# Patient Record
Sex: Female | Born: 1935 | Race: White | Hispanic: No | Smoking: Never smoker
Health system: Southern US, Community
[De-identification: ages and names within clinical notes are randomized; demographics above are authoritative.]

## PROBLEM LIST (undated history)

## (undated) DIAGNOSIS — Z950 Presence of cardiac pacemaker: Secondary | ICD-10-CM

## (undated) DIAGNOSIS — M461 Sacroiliitis, not elsewhere classified: Secondary | ICD-10-CM

## (undated) DIAGNOSIS — M8580 Other specified disorders of bone density and structure, unspecified site: Secondary | ICD-10-CM

## (undated) DIAGNOSIS — E7800 Pure hypercholesterolemia, unspecified: Secondary | ICD-10-CM

## (undated) DIAGNOSIS — R0609 Other forms of dyspnea: Secondary | ICD-10-CM

## (undated) DIAGNOSIS — R51 Headache: Secondary | ICD-10-CM

## (undated) DIAGNOSIS — M1990 Unspecified osteoarthritis, unspecified site: Secondary | ICD-10-CM

## (undated) DIAGNOSIS — I10 Essential (primary) hypertension: Secondary | ICD-10-CM

## (undated) DIAGNOSIS — R5383 Other fatigue: Secondary | ICD-10-CM

## (undated) DIAGNOSIS — J45909 Unspecified asthma, uncomplicated: Secondary | ICD-10-CM

## (undated) DIAGNOSIS — F419 Anxiety disorder, unspecified: Secondary | ICD-10-CM

## (undated) DIAGNOSIS — R0600 Dyspnea, unspecified: Secondary | ICD-10-CM

## (undated) DIAGNOSIS — F4321 Adjustment disorder with depressed mood: Secondary | ICD-10-CM

## (undated) DIAGNOSIS — N183 Chronic kidney disease, stage 3 (moderate): Secondary | ICD-10-CM

## (undated) DIAGNOSIS — R29898 Other symptoms and signs involving the musculoskeletal system: Secondary | ICD-10-CM

## (undated) DIAGNOSIS — M719 Bursopathy, unspecified: Secondary | ICD-10-CM

## (undated) DIAGNOSIS — E669 Obesity, unspecified: Secondary | ICD-10-CM

## (undated) DIAGNOSIS — F32A Depression, unspecified: Secondary | ICD-10-CM

## (undated) DIAGNOSIS — R079 Chest pain, unspecified: Secondary | ICD-10-CM

## (undated) DIAGNOSIS — I509 Heart failure, unspecified: Secondary | ICD-10-CM

## (undated) DIAGNOSIS — G609 Hereditary and idiopathic neuropathy, unspecified: Principal | ICD-10-CM

## (undated) DIAGNOSIS — E039 Hypothyroidism, unspecified: Secondary | ICD-10-CM

## (undated) DIAGNOSIS — M6530 Trigger finger, unspecified finger: Secondary | ICD-10-CM

## (undated) DIAGNOSIS — D638 Anemia in other chronic diseases classified elsewhere: Secondary | ICD-10-CM

## (undated) DIAGNOSIS — F329 Major depressive disorder, single episode, unspecified: Secondary | ICD-10-CM

## (undated) DIAGNOSIS — C801 Malignant (primary) neoplasm, unspecified: Secondary | ICD-10-CM

## (undated) DIAGNOSIS — G629 Polyneuropathy, unspecified: Secondary | ICD-10-CM

## (undated) DIAGNOSIS — J189 Pneumonia, unspecified organism: Secondary | ICD-10-CM

## (undated) DIAGNOSIS — I447 Left bundle-branch block, unspecified: Secondary | ICD-10-CM

## (undated) HISTORY — PX: ACHILLES TENDON SURGERY: SHX542

## (undated) HISTORY — DX: Trigger finger, unspecified finger: M65.30

## (undated) HISTORY — DX: Hereditary and idiopathic neuropathy, unspecified: G60.9

## (undated) HISTORY — DX: Hypothyroidism, unspecified: E03.9

## (undated) HISTORY — DX: Other specified disorders of bone density and structure, unspecified site: M85.80

## (undated) HISTORY — DX: Polyneuropathy, unspecified: G62.9

## (undated) HISTORY — DX: Pure hypercholesterolemia, unspecified: E78.00

## (undated) HISTORY — DX: Obesity, unspecified: E66.9

## (undated) HISTORY — DX: Other symptoms and signs involving the musculoskeletal system: R29.898

## (undated) HISTORY — PX: TONSILLECTOMY: SUR1361

## (undated) HISTORY — DX: Sacroiliitis, not elsewhere classified: M46.1

## (undated) HISTORY — DX: Other fatigue: R53.83

## (undated) HISTORY — DX: Other forms of dyspnea: R06.09

## (undated) HISTORY — DX: Dyspnea, unspecified: R06.00

## (undated) HISTORY — DX: Chest pain, unspecified: R07.9

## (undated) HISTORY — PX: POLYPECTOMY: SHX149

## (undated) HISTORY — DX: Essential (primary) hypertension: I10

## (undated) HISTORY — PX: BREAST LUMPECTOMY: SHX2

## (undated) HISTORY — PX: LUMBAR LAMINECTOMY: SHX95

## (undated) HISTORY — PX: VESICOVAGINAL FISTULA CLOSURE W/ TAH: SUR271

## (undated) HISTORY — PX: LEG SURGERY: SHX1003

---

## 1998-09-30 ENCOUNTER — Ambulatory Visit (HOSPITAL_COMMUNITY): Admission: RE | Admit: 1998-09-30 | Discharge: 1998-09-30 | Payer: Self-pay | Admitting: *Deleted

## 1998-11-12 ENCOUNTER — Encounter (INDEPENDENT_AMBULATORY_CARE_PROVIDER_SITE_OTHER): Payer: Self-pay

## 1998-11-12 ENCOUNTER — Encounter: Payer: Self-pay | Admitting: General Surgery

## 1998-11-12 ENCOUNTER — Ambulatory Visit (HOSPITAL_COMMUNITY): Admission: RE | Admit: 1998-11-12 | Discharge: 1998-11-12 | Payer: Self-pay

## 1998-11-24 ENCOUNTER — Encounter: Admission: RE | Admit: 1998-11-24 | Discharge: 1999-02-22 | Payer: Self-pay | Admitting: Radiation Oncology

## 1999-04-06 ENCOUNTER — Encounter: Payer: Self-pay | Admitting: Obstetrics and Gynecology

## 1999-04-06 ENCOUNTER — Encounter: Admission: RE | Admit: 1999-04-06 | Discharge: 1999-04-06 | Payer: Self-pay | Admitting: Family Medicine

## 1999-09-10 ENCOUNTER — Ambulatory Visit (HOSPITAL_COMMUNITY): Admission: RE | Admit: 1999-09-10 | Discharge: 1999-09-10 | Payer: Self-pay | Admitting: Gastroenterology

## 1999-09-28 ENCOUNTER — Encounter: Payer: Self-pay | Admitting: Oncology

## 1999-09-28 ENCOUNTER — Encounter: Admission: RE | Admit: 1999-09-28 | Discharge: 1999-09-28 | Payer: Self-pay | Admitting: Oncology

## 1999-12-17 ENCOUNTER — Encounter (INDEPENDENT_AMBULATORY_CARE_PROVIDER_SITE_OTHER): Payer: Self-pay

## 1999-12-17 ENCOUNTER — Other Ambulatory Visit: Admission: RE | Admit: 1999-12-17 | Discharge: 1999-12-17 | Payer: Self-pay | Admitting: Obstetrics and Gynecology

## 2001-02-23 ENCOUNTER — Encounter: Payer: Self-pay | Admitting: General Surgery

## 2001-02-23 ENCOUNTER — Encounter: Admission: RE | Admit: 2001-02-23 | Discharge: 2001-02-23 | Payer: Self-pay | Admitting: General Surgery

## 2001-04-25 ENCOUNTER — Other Ambulatory Visit: Admission: RE | Admit: 2001-04-25 | Discharge: 2001-04-25 | Payer: Self-pay | Admitting: Obstetrics and Gynecology

## 2001-05-19 ENCOUNTER — Encounter: Payer: Self-pay | Admitting: Orthopaedic Surgery

## 2001-05-19 ENCOUNTER — Inpatient Hospital Stay (HOSPITAL_COMMUNITY): Admission: EM | Admit: 2001-05-19 | Discharge: 2001-05-26 | Payer: Self-pay | Admitting: Orthopaedic Surgery

## 2001-05-20 ENCOUNTER — Encounter: Payer: Self-pay | Admitting: Orthopaedic Surgery

## 2002-02-26 ENCOUNTER — Encounter: Admission: RE | Admit: 2002-02-26 | Discharge: 2002-02-26 | Payer: Self-pay | Admitting: General Surgery

## 2002-02-26 ENCOUNTER — Encounter: Payer: Self-pay | Admitting: General Surgery

## 2002-05-17 ENCOUNTER — Other Ambulatory Visit: Admission: RE | Admit: 2002-05-17 | Discharge: 2002-05-17 | Payer: Self-pay | Admitting: Obstetrics and Gynecology

## 2002-12-19 ENCOUNTER — Ambulatory Visit (HOSPITAL_COMMUNITY): Admission: RE | Admit: 2002-12-19 | Discharge: 2002-12-19 | Payer: Self-pay | Admitting: Obstetrics and Gynecology

## 2002-12-19 ENCOUNTER — Encounter (INDEPENDENT_AMBULATORY_CARE_PROVIDER_SITE_OTHER): Payer: Self-pay

## 2003-02-28 ENCOUNTER — Encounter: Admission: RE | Admit: 2003-02-28 | Discharge: 2003-02-28 | Payer: Self-pay | Admitting: General Surgery

## 2003-05-30 ENCOUNTER — Other Ambulatory Visit: Admission: RE | Admit: 2003-05-30 | Discharge: 2003-05-30 | Payer: Self-pay | Admitting: Obstetrics and Gynecology

## 2003-09-18 ENCOUNTER — Encounter: Admission: RE | Admit: 2003-09-18 | Discharge: 2003-09-18 | Payer: Self-pay | Admitting: Orthopaedic Surgery

## 2003-09-19 ENCOUNTER — Ambulatory Visit (HOSPITAL_COMMUNITY): Admission: RE | Admit: 2003-09-19 | Discharge: 2003-09-19 | Payer: Self-pay | Admitting: Family Medicine

## 2003-10-14 ENCOUNTER — Ambulatory Visit (HOSPITAL_COMMUNITY): Admission: RE | Admit: 2003-10-14 | Discharge: 2003-10-14 | Payer: Self-pay | Admitting: Obstetrics and Gynecology

## 2003-12-06 ENCOUNTER — Encounter: Admission: RE | Admit: 2003-12-06 | Discharge: 2003-12-06 | Payer: Self-pay | Admitting: Obstetrics and Gynecology

## 2003-12-22 ENCOUNTER — Ambulatory Visit: Payer: Self-pay | Admitting: Oncology

## 2004-03-20 ENCOUNTER — Encounter: Admission: RE | Admit: 2004-03-20 | Discharge: 2004-03-20 | Payer: Self-pay | Admitting: Family Medicine

## 2004-07-08 ENCOUNTER — Other Ambulatory Visit: Admission: RE | Admit: 2004-07-08 | Discharge: 2004-07-08 | Payer: Self-pay | Admitting: Obstetrics and Gynecology

## 2004-10-27 ENCOUNTER — Ambulatory Visit (HOSPITAL_COMMUNITY): Admission: RE | Admit: 2004-10-27 | Discharge: 2004-10-27 | Payer: Self-pay | Admitting: Gastroenterology

## 2005-04-06 ENCOUNTER — Encounter: Admission: RE | Admit: 2005-04-06 | Discharge: 2005-04-06 | Payer: Self-pay | Admitting: Family Medicine

## 2005-07-12 ENCOUNTER — Other Ambulatory Visit: Admission: RE | Admit: 2005-07-12 | Discharge: 2005-07-12 | Payer: Self-pay | Admitting: Obstetrics and Gynecology

## 2005-12-20 ENCOUNTER — Encounter
Admission: RE | Admit: 2005-12-20 | Discharge: 2005-12-20 | Payer: Self-pay | Admitting: Physical Medicine and Rehabilitation

## 2006-04-11 ENCOUNTER — Encounter: Admission: RE | Admit: 2006-04-11 | Discharge: 2006-04-11 | Payer: Self-pay | Admitting: *Deleted

## 2006-10-05 ENCOUNTER — Other Ambulatory Visit: Admission: RE | Admit: 2006-10-05 | Discharge: 2006-10-05 | Payer: Self-pay | Admitting: Obstetrics and Gynecology

## 2007-05-09 ENCOUNTER — Encounter: Admission: RE | Admit: 2007-05-09 | Discharge: 2007-05-09 | Payer: Self-pay | Admitting: Obstetrics and Gynecology

## 2007-11-01 ENCOUNTER — Other Ambulatory Visit: Admission: RE | Admit: 2007-11-01 | Discharge: 2007-11-01 | Payer: Self-pay | Admitting: Obstetrics and Gynecology

## 2008-05-10 ENCOUNTER — Encounter: Admission: RE | Admit: 2008-05-10 | Discharge: 2008-05-10 | Payer: Self-pay | Admitting: Family Medicine

## 2008-11-04 ENCOUNTER — Other Ambulatory Visit: Admission: RE | Admit: 2008-11-04 | Discharge: 2008-11-04 | Payer: Self-pay | Admitting: Obstetrics and Gynecology

## 2009-05-16 ENCOUNTER — Encounter: Admission: RE | Admit: 2009-05-16 | Discharge: 2009-05-16 | Payer: Self-pay | Admitting: Obstetrics and Gynecology

## 2010-04-30 ENCOUNTER — Other Ambulatory Visit: Payer: Self-pay | Admitting: Family Medicine

## 2010-04-30 DIAGNOSIS — Z1231 Encounter for screening mammogram for malignant neoplasm of breast: Secondary | ICD-10-CM

## 2010-05-26 ENCOUNTER — Ambulatory Visit
Admission: RE | Admit: 2010-05-26 | Discharge: 2010-05-26 | Disposition: A | Discharge status: Home / Self Care | Payer: Medicare Other | Source: Ambulatory Visit | Attending: Family Medicine | Admitting: Family Medicine

## 2010-05-26 DIAGNOSIS — Z1231 Encounter for screening mammogram for malignant neoplasm of breast: Secondary | ICD-10-CM

## 2010-06-05 NOTE — H&P (Signed)
NAME:  Alexandra [name], Alexandra [name]                        ACCOUNT NO.:  [id number]   MEDICAL RECORD NO.:  [id number]                   PATIENT TYPE:  AMB   LOCATION:  SDC                                  FACILITY:  [facility]   PHYSICIAN:  Sander Radon, M.D.                 DATE OF BIRTH:  [birth year]   DATE OF ADMISSION:  12/19/2002  DATE OF DISCHARGE:                                HISTORY & PHYSICAL   HISTORY AND PHYSICAL:  The patient is a 77 year old Caucasian female, para  5, admitted for D&C, hysteroscopy and polypectomy. The patient called our  office on October 03, 2002 complaining of vaginal bleeding. She  subsequently underwent a pelvic ultrasound which showed a thickened  endometrium measuring 12 mm with inhomogeneous echoes and cystic areas and  echogenic foci. She subsequently underwent a sonohysterogram. At  sonohysterogram, she was found to have at least three polyp defects. The  patient was advised to undergo D&C, hysteroscopy and polypectomy. The risks  of surgery including anesthetic complications, hemorrhage, infection, damage  to adjacent structures including bladder, bowel, blood vessels, and ureter  were discussed with the patient. She was made aware of the risk of uterine  perforation which could result in overwhelming life threatening hemorrhage  requiring emergent hysterectomy or uterine perforation which could result in  bowel damage requiring emergent colostomy which could result in overwhelming  life threatening peritonitis. She expressed an understanding of and  acceptance of these risks. She is also admitted for a D&C, hysteroscopy and  polypectomy.  </PAST   OBSTETRICAL/GYNECOLOGICAL HISTORY:  The patient has been menopausal for  several years.   PAST MEDICAL HISTORY:  1. History of left breast cancer diagnosed in November 2000.  2. Arthritis.  3. Depression.  4. Previous __________ Pap.  5. History of osteopenia.   ALLERGIES:  No known drug allergies.   CURRENT MEDICATIONS:  1. Fosamax 70 mg weekly.  2. Calcium.  3. Multivitamin.  4. Lexapro.  5. Tamoxifen.  6. Vioxx.   PAST SURGICAL HISTORY:  1. Left lumpectomy and axillary dissection in 2000.  2. Achilles tendon surgery, November 2001. Section of Achilles tendon with     subsequent surgery August 2002 and July 2002.  3. The patient also had a LEEP for CIN1, February 1998 and also underwent a     D&C, hysteroscopy for postmenopausal bleeding in 1999.   FAMILY HISTORY:  There is no family history of colon, ovarian, or prostate  cancer. The patient's maternal aunt was diagnosed with breast cancer. The  patient's mother died at 68. Her father died at 49 of pancreatic cancer. She  has a brother age 7 alive and well, two sisters ages 12 and 46 alive and  well. Five children ages ranged 38 to 77 alive and well.   SOCIAL HISTORY:  Occupation, the patient is retired. Tobacco none. Alcohol  none.   REVIEW OF SYMPTOMS:  Noncontributory except  as noted above. Denies  headaches, visual changes, chest pain, shortness of breath, abdominal pain,  change in bowel habits, unintentional weight loss. Denies dysuria, urgency,  frequency, vaginal pruritus or discharge, pain or bleeding with intercourse.   PHYSICAL EXAMINATION:  GENERAL:  Well-developed, Caucasian female.  VITAL SIGNS:  Blood pressure 160/80, heart rate 92, weight 231.5.  HEENT:  Normal.  NECK:  Supple without thyromegaly, adenopathy or nodules.  CHEST:  Clear to auscultation.  BREASTS:  In the upper outer quadrant of the left breast is a small well  healed lumpectomy scar. There are no masses noted, No retraction or nipple  discharge.  CARDIAC:  Regular rate and rhythm without extra sounds or murmurs.  ABDOMEN:  Soft, nontender, no hepatosplenomegaly or masses.  EXTREMITIES:  No cyanosis, clubbing or edema.  NEUROLOGICAL:  Oriented x3.  PELVIC:  Grossly normal pelvic examination. Normal external female  genitalia. No vulvar,  vaginal or cervical lesions. The patient's most recent  Pap smear done May 17, 2002 showed benign reactive changes. Bimanual  examination reveals the uterus to be anteverted, six weeks, mobile,  nontender without any adnexal mass palpated.  RECTAL:  Excellent sphincter tone, confirms pelvic exam, no masses palpated.   LABORATORY DATA:  Preoperative laboratories revealed sodium of 141,  potassium 4.9, chloride 105, CO2 30, BUN of 17 and creatinine 1.0. Liver  function tests were normal as well. With respect to her CBC, hemoglobin is  normal at 12.8 with a platelet count of 234,000 and a white count of 6800.  Urinalysis is negative.   ASSESSMENT/PLAN:  The patient is a 77 year old Caucasian female with a  history of postmenopausal bleeding and at least three polyps noted on  sonohysterogram admitted for dilatation and curettage, hysteroscopy and  polypectomy. She has increased risk to develop endometrial hyperplasia or  endometrial cancer due to her history of breast cancer and also due to her  body habitus as she is 5 feet 4-1/2 inches and weighs 231 pounds. We did  receive a note from Dr. Granville Lewis clearing her for surgery. The patient's  questions have been answered. She expressed an understanding of and  acceptance of the risks of surgery and desires to proceed.                                               Sander Radon, M.D.    DC/MEDQ  D:  12/18/2002  T:  12/18/2002  Job:  [id number]

## 2010-06-05 NOTE — Discharge Summary (Signed)
[facility]  Patient:    Alexandra [name], Alexandra [name] Visit Number: [id number] MRN: [id number]          Service Type: SUR Location: 4W 0482 01 Attending Physician:  Starling Manns Dictated by:   Alexzandrew L. Perkins, P.A.-C. Admit Date:  05/19/2001 Discharge Date: 05/26/2001                             Discharge Summary  ADMITTING DIAGNOSES: 1. Spinal stenosis at L3-L4 with disc herniation and free fragment. 2. History of breast carcinoma. 3. History of Achilles tendon rupture.  DISCHARGE DIAGNOSES: 1. Spinal stenosis with disc herniation at L3-L4, status post L2-L3, L3-L4,    L4-L5 lumbar laminectomy with decompression of dural sac and nerve roots    bilaterally at L2, L3, L4 and L5. 2. Status post excision of extruded L3-L4 disc fragment. 3. Postoperative hemorrhagic anemia. 4. Status post transfusion without sequelae. 5. History of breast carcinoma. 6. History of Achilles tendon rupture.  PROCEDURE:  The patient was taken to the operating room on May 20, 2001, and underwent a lumbar laminectomy at L2-L3, L3-L4, L4-L5 with decompression of the common dural sac and the bilateral nerve roots at L2, L3, L4 and L5 with removal of extruded disc fragment at L3-L4.  Surgeon Dr. Rennis Harding, assistant [facility], P.A.-C under general anesthesia.  HISTORY OF PRESENT ILLNESS:  The patient is a 77 year old female who presented to the office with 36-hour severe lower extremity pain and weakness.  She was unable to ambulate with legs giving way and buckling of her knee.  She was sent over for an MRI on emergent situation and the patient was found to have profound right lower extremity quad weakness.  She started on medications and she was admitted for diagnostic workup and likely to undergo surgery.  LABORATORY DATA AND X-RAY FINDINGS:  CBC on admission with hemoglobin 11.6, hematocrit 33.2, white cell count 8.5, red cell count 3.54.  Postop H&H 9.3 and 27.0 and  continued to drop down to 8.6 and 25.0, given units of blood. Transfusion hemoglobin 10.8 and hematocrit 31.3.  PT/PTT 13.7 and 25 respectively with an INR of 1.0 on admission.  Chem panel on admission with an elevated BUN of 26 and decreased Alk phos at 27.  The remaining Chem panel was within normal limits.  Followup BMET with glucose dropping from 161 down to 136, BUN came back down to within normal limits at 19, calcium dropped 9.1 to 8.1.  Last BMET on May 23, 2001, with all electrolytes including glucose and calcium within normal limits.  Urinalysis on admission with large leukocyte esterase and many epithelial cells.  White cells 21-50 and a few bacteria. Blood group type A positive.  EKG dated May 19, 2001, with normal sinus rhythm and normal EKG.  Chest x-ray on May 19, 2001, with mild cardiac enlargement, negative for acute and inflammatory process.  Intraoperative spine films on May 20, 2001, shows localization of two probes at the level of L3 and L4 inferior end plates.  HOSPITAL COURSE:  The patient was admitted to [facility] and sent for an MRI.  The patient underwent diagnostic workup and found to have spinal stenosis and extruded disc at L3-L4.  She was scheduled for surgery and taken to the operating room on May 20, 2001, and underwent the above-stated procedure without complication.  The patient tolerated the procedure well and was lateral transferred back to the  floor.  Hemovac drain was placed at the time of surgery.  This was pulled without difficulty on postop day #2.  She was started on p.o. and IV analgesic for pain control following surgery.  Home medications were restarted.  She was actually placed on PCA Dilaudid. Physical therapy was consulted postop to assist with ambulation.  H&Hs were followed very closely.  She had a lower hemoglobin on postop day #1.  On postop day #2, her hemoglobin continued to drop down to a level of 8.6.  This was down from 11.6  on admission.  It was recommended that she receive blood. Hemoglobin was back up to 10.8.  She was weaned off her PCA analgesics over to p.o. analgesics.  Her diet was slowly increased when she started passing flatus.  She was slow to progress with physical therapy, therefore, rehabilitation consult was ordered.  The patient was seen in consultation by Dr. Jarvis Morgan and he felt she would be appropriate for inpatient rehabilitation and would transfer over in the next two to four days. Therefore, she continued with rehabilitation and therapy.  Dressing changes were initiated on postop day #2.  The incision was healing well.  She continued to improve throughout the hospital course.  There was no bed on rehabilitation, therefore she continued with physical therapy on the orthopedic floor.  During this time, her ambulatory status improved and she was up by May 26, 2001, ambulating 80-100 feet.  It was felt that she had improved well enough that she would not require inpatient stay.  Discharge planning assisted with home care needs.  The patient would be started out on home health PT and OT.  She continued to improve and by May 26, 2001, she was doing quite well.  Her bowels were moving and she was tolerating p.o. analgesics and she desired to go home.  DISPOSITION:  Discharged to home on May 26, 2001.  DISCHARGE MEDICATIONS: 1. Colace 100 mg #60 b.i.d. 2. Percocet #40 p.r.n. pain. 3. Robaxin 500 mg #40 p.r.n. spasm.  DIET:  As tolerated.  ACTIVITY:  She is placed into a lumbar corset for comfort.  Walker for stability.  She may be up as tolerated.  Back precautions.  Home health PT for continued gait training ambulation.  FOLLOWUP:  Follow up in two to three weeks with Dr. Patrice Paradise.  She is to call for an appointment at [phone].  CONDITION ON DISCHARGE:  Improved. Dictated by:   Alexzandrew L. Perkins, P.A.-C. Attending Physician:  Starling Manns. DD:  06/19/01 TD:  06/21/01 Job:  [id number]

## 2010-06-05 NOTE — Op Note (Signed)
[facility]  Patient:    Alexandra [name], Alexandra [name] Visit Number: [id number] MRN: [id number]          Service Type: SUR Location: 4W 0482 01 Attending Physician:  Starling Manns Dictated by:   Starling Manns, M.D. Proc. Date: 05/20/01 Admit Date:  05/19/2001                             Operative Report  DIAGNOSES: 1. Extruded L3-4 disk herniation with right lower extremity radiculopathy. 2. Spinal stenosis.  PROCEDURES: 1. L2-3, L3-4, L4-5 lumbar laminectomy with decompression of the common dural    sac and the L2, L3, L4, and L5 nerve roots bilaterally. 2. Removal of extruded L3-4 disk fragment.  SURGEON:  Starling Manns, M.D.  ASSISTANT:  Colleen P. Mahar, P.A.  ANESTHESIA:  General endotracheal.  ESTIMATED BLOOD LOSS:  750 cc.  COMPLICATIONS:  None.  INDICATIONS AND FINDINGS:  The patient is a 77 year old female, who presented to the office yesterday with a 36 hour history of severe right lower extremity pain, numbness, and weakness.  She was unable to ambulate.  She complained of severe quad weakness and buckling of her knee.  She was initially evaluated by Dr. Gladstone Lighter, who sent her for an emergent MRI scan because of the profound right lower extremity quad weakness.  She had been started on a prednisone dosepak by per primary care physician.  She is taking hydrocodone as well as muscle relaxers.  Her exam was consistent with a right lower extremity radiculopathy.  Quadriceps strength on the right was 2/5.  Hip flexors 3/5. There was an absent right knee jerk.  MRI scan showed multilevel degenerative disk disease with a large, extruded disk fragment at L3-4 on the right which had migrated distally almost to the L4-5 disk space.  There was also multifactorial spinal stenosis, most severe at L3-4 with severe bilateral subarticular lateral recess stenosis L3-4 and L4-5.  Because of her intractable pain and progressive quad weakness and inability to  ambulate, she was admitted to the hospital.  After weighing the risks, benefits and alternatives of surgical decompression, the patient elected to proceed with surgery.  At surgery, the facet joint and ligamentum flavum were found to be hypertrophied.  There was severe central and lateral recess stenosis bilaterally at L3-4 and L4-5.  The right L4 nerve root was found to be severe compressed by a large, extruded disk fragment which had migrated distally into the axilla of the root and almost to the L4-5 interspace.  The fragment was removed, and the nerve completely decompressed from its takeoff, all the way out the L4-5 foramen.  The common dural sac and L2, L3, L4, and L5 nerve roots were decompressed bilaterally by removing the inferior portion of the L2 lamina, the entire L3 and L4 lamina, and the superior portion of the L5 lamina.  The L3-4 disk was examined, and I could not identify an annular rent. The disk itself felt firm and partially calcified, and we elected not to perform an annulotomy.  The L4-5 disk space was also examined and, again, felt to be partially calcified, and no annular rent was identified.  There were large epidural bleeders encountered, and these were controlled with bipolar electrocautery and Gelfoam.  DESCRIPTION OF PROCEDURE:  The patient was properly identified in the holding area as Alexandra [name] and taken to the operating room.  She underwent general endotracheal anesthesia without difficulty.  She was given IV prophylactic antibiotics.  She was turned prone onto the Acumed four-poster positioning frame.  Her face and all bony prominences were padded.  The back was prepped, draped, and shaved in the usual sterile fashion.  An incision was made over the L2-3, L4 and L5 spinous processes.  This was carried down to deep fascia. The deep fascia was incised, and the paraspinous muscles were subperiosteally stripped out to the facet joints bilaterally.  Care  was taken to protect the facet joint capsules.  We identified the pars interarticularis at L3-4 and L5 We took an intraoperative x-ray, confirming that our probes were on the L3 and L4 spinous process.  We then performed a complete laminectomy using Leksell rongeurs and Kerrisons, removing the inferior portion of the L2 lamina, the entire L3 and L4 lamina, and the superior portion of the L5 lamina.  At this point, the lateral recess was decompressed bilaterally by performing medial one-third facetectomies at L3-4 and L4-5 bilaterally.  On the right side, we gently retracted the thecal sac and found that we were unable to mobilize the L4 nerve root.  We identified the large, extruded disk fragment which was in the axilla of the L4 nerve root and actually almost to the L4-5 interspace. Large epidural bleeders were encountered, and these were controlled using bipolar electrocautery and Gelfoam.  The free disk fragment was removed.  We were then able to mobilize the thecal sac, and we examined the L3-4 and L4-5 interspaces.  These were found to be partially calcified.  There was no annular rent identified, and it was decided not to enter the disk space.  At the completion of the procedure, we were able to directly visualize the L2, L3, L4, and L5 roots bilaterally, confirmed that they were patent all the way out the foramen using a blunt probe.  The right L4 root was scarred and discolored.  We confirmed that it was free from its takeoff all the way out the foramen.  The wound was copiously irrigated.  A deep drain was placed. The deep fascia was closed using a #1 running Vicryl suture followed by a 2-0 interrupted Vicryl in the subcutaneous layer and then a running locking 3-0 nylon.  Sterile dressing was applied.  The patient was turned supine and extubated.  She was able to move her upper and lower extremities and transferred to the recovery room in stable condition. Dictated by:   Starling Manns, M.D. Attending Physician:  Starling Manns. DD:  05/20/01 TD:  05/21/01 Job: [id number]

## 2010-06-05 NOTE — Procedures (Signed)
[facility]  Patient:    Alexandra [name], Alexandra [name]                     MRN: [id number] Adm. Date:  [id number] Disc. Date: [id number] Attending:  Orvis Brill CC:         Southside Magrinat, M.D.  Olivia Canter Theda Sers, M.D.  Darletta Moll Bubba Hales, M.D.   Procedure Report  PROCEDURE:  Colonoscopy.  INDICATION:  Screening in a patient with breast cancer. Consent was signed after risks, benefits, methods, and options were thoroughly discussed in the office.  MEDICINES USED:  Demerol 50, Versed 5.  DESCRIPTION OF PROCEDURE:  Rectal inspection is pertinent for external hemorrhoids. Digital exam was negative. The video colonoscope was inserted, easily advanced around the colon to the cecum. This did require some left lower quadrant pressure but no position changes. Cecum was identified by the appendiceal orifice and the ileocecal valve. In fact, the scope was inserted a shortways from the terminal ileum which was normal. Photodocumentation was obtained. The scope was slowly withdrawn. On insertion, no abnormalities were seen. On the slow withdrawal through the colon, the cecum, ascending, transverse, descending, and sigmoid were all normal. Once back in the rectum, the scope was retroflexed. Pertinent for some internal hemorrhoids. Anorectal pull-through confirmed the above. The scope was straightened and readvanced a shortways up the sigmoid.  Air was suctioned, scope removed. The patient tolerated the procedure well. There was no obvious immediate complications.  ENDOSCOPIC DIAGNOSES: 1. Internal/external hemorrhoids, small. 2. Otherwise within normal limits to the terminal ileum.  PLAN:  Yearly rectals and guaiacs per Dr. Theda Sers or Dr. Bubba Hales.  I will be happy see to back p.r.n. Otherwise consideration for repeat screening in five to ten years. DD:  09/10/99 TD:  09/12/99 Job: [id number]

## 2010-06-05 NOTE — Op Note (Signed)
NAME:  Alexandra [name], Alexandra [name]                        ACCOUNT NO.:  [id number]   MEDICAL RECORD NO.:  [id number]                   PATIENT TYPE:  AMB   LOCATION:  SDC                                  FACILITY:  [facility]   PHYSICIAN:  Sander Radon, M.D.                 DATE OF BIRTH:  1927/07/09   DATE OF PROCEDURE:  12/19/2002  DATE OF DISCHARGE:                                 OPERATIVE REPORT   PREOPERATIVE DIAGNOSES:  1. Postmenopausal bleeding.  2. Endometrial polyp found on sonohistogram.   POSTOPERATIVE DIAGNOSES:  1. Postmenopausal bleeding.  2. Endometrial polyp found on sonohistogram.   PROCEDURES:  1. Dilatation and curettage.  2. Hysteroscopy.  3. Polypectomy.   SURGEON:  Sander Radon, M.D., PhD.   ANESTHESIA:  General by LMA plus 20 mL 1% lidocaine paracervical block.   FLUIDS:  900 mL of Crystalloid.   ESTIMATED BLOOD LOSS:  Minimal.   COMPLICATIONS:  None.   DRAINS:  None.   DESCRIPTION OF PROCEDURE:  The patient was brought to the operating room and  identified on the operating table.  After induction of adequate general  anesthesia by LMA, the patient was placed in the dorsal lithotomy position  and prepped and draped in the usual sterile fashion.  The bladder was  straight catheterized, approximately 50 to 75 mL of clear, yellow urine.  A  bimanual examination again confirmed the uterus to be retroverted.  A  speculum was placed but I was unable to visualize the cervix due to her  habitus, thus, we obtained a larger speculum to be used during the surgery.  The posterior lip of the cervix was grasped with single-tooth tenaculum and  20 mL of 1% lidocaine were infused for paracervical block.  I subsequently  began to dilate the patient's cervix.  However, interestingly, the canal was  noted to track anteriorly and to the left so I changed the tenaculum to the  anterior lip of the cervix.  Dilatation preceded very carefully and smoothly  and the uterus sounded to  7 cm.  The [facility] hysteroscope was introduced but  was unable to easily distend the uterine cavity so we increased the pressure  to 80.  The polyps were identified.  After a careful examination, scope was  withdrawn and a careful curettage was performed in a clockwise fashion with  polyps obtained and sent to pathology for examination.  The Randall stone  forceps were placed and additional soft polypoid tissue was obtained.  Interestingly, the uterine cavity was noted to be larger on the right side  portion.  I was able to feel very gently up to the fundus of the uterus.  After a good cry was heard all around, the procedure was then terminated.  The patient tolerated the procedure well without apparent  complications, transferred to the recovery room in stable condition after  all sponge, needle and instrument counts were correct.  She received a post  D&C instruction sheet with urge to return for her postoperative visit in two  weeks, to call with any problems.                                               Sander Radon, M.D.    DC/MEDQ  D:  12/19/2002  T:  12/19/2002  Job:  [id number]   cc:   Darletta Moll. Bubba Hales, M.D.  [address]  [place]  [place] [address]  Fax: [phone]

## 2010-06-05 NOTE — Op Note (Signed)
Alexandra [name], Alexandra [name]              ACCOUNT NO.:  [id number]   MEDICAL RECORD NO.:  [id number]          PATIENT TYPE:  AMB   LOCATION:  ENDO                         FACILITY:  [facility]   PHYSICIAN:  Jeryl Columbia, M.D.    DATE OF BIRTH:  1927/03/31   DATE OF PROCEDURE:  10/27/2004  DATE OF DISCHARGE:                                 OPERATIVE REPORT   PROCEDURE:  Colonoscopy.   ENDOSCOPIST:  Jeryl Columbia, M.D.   INDICATIONS:  Screening.  Patient with history of breast cancer.  Consent  was signed after risks, benefits, methods and options were thoroughly  discussed multiple times in the past.   MEDICINES USED:  Demerol 50 mg, Versed 5 mg.   PROCEDURE:  Rectal inspection was pertinent for external hemorrhoids.  Digital exam was negative.  Video pediatric adjustable colonoscope was  inserted and fairly easily advanced around the colon to the cecum, but did  require some abdominal pressure, no position changes.  No abnormalities were  seen on insertion.  Cecum was identified by the appendiceal orifice and the  ileocecal valve.  Scope was slowly withdrawn.  Prep was adequate.  There was  some liquid stool that required washing and suctioning.  On slow withdrawal  through the colon, no abnormalities were seen, specifically no polyps,  tumors, masses or diverticula.  Once back in the rectum, anorectal pull-  through in retroflexion confirmed some small hemorrhoids.  Scope was  straightened and readvanced a short ways up the left side of the colon, air  was suctioned and scope removed.  The patient tolerated the procedure well.  There were no obvious immediate complications.   ENDOSCOPIC DIAGNOSES:  1.  Internal and external small hemorrhoids.  2.  Otherwise within normal limits to the cecum.   PLAN:  Recheck colon screening in 5-10 years.  Happy to see back p.r.n.  Return care to Drs. Heller and [place] for the customary healthcare  maintenance and screening to include yearly rectals  and guaiacs.           ______________________________  Jeryl Columbia, M.D.     MEM/MEDQ  D:  10/27/2004  T:  10/27/2004  Job:  [id number]   cc:   Darletta Moll. Bubba Hales, M.D.  Fax: [phone]   Sander Radon, M.D.  Fax: [phone]

## 2010-08-18 ENCOUNTER — Ambulatory Visit: Payer: Medicare Other | Admitting: Physical Therapy

## 2010-12-01 ENCOUNTER — Ambulatory Visit: Payer: Medicare Other | Attending: Family Medicine | Admitting: Physical Therapy

## 2010-12-01 DIAGNOSIS — IMO0001 Reserved for inherently not codable concepts without codable children: Secondary | ICD-10-CM | POA: Insufficient documentation

## 2010-12-01 DIAGNOSIS — M6281 Muscle weakness (generalized): Secondary | ICD-10-CM | POA: Insufficient documentation

## 2010-12-01 DIAGNOSIS — R5381 Other malaise: Secondary | ICD-10-CM | POA: Insufficient documentation

## 2010-12-01 DIAGNOSIS — M2560 Stiffness of unspecified joint, not elsewhere classified: Secondary | ICD-10-CM | POA: Insufficient documentation

## 2010-12-01 DIAGNOSIS — M2550 Pain in unspecified joint: Secondary | ICD-10-CM | POA: Insufficient documentation

## 2010-12-16 ENCOUNTER — Ambulatory Visit: Payer: Medicare Other | Admitting: Physical Therapy

## 2010-12-17 ENCOUNTER — Ambulatory Visit: Payer: Medicare Other | Admitting: Physical Therapy

## 2010-12-23 ENCOUNTER — Ambulatory Visit: Payer: Medicare Other | Attending: Family Medicine | Admitting: Physical Therapy

## 2010-12-23 DIAGNOSIS — R5381 Other malaise: Secondary | ICD-10-CM | POA: Insufficient documentation

## 2010-12-23 DIAGNOSIS — M2550 Pain in unspecified joint: Secondary | ICD-10-CM | POA: Insufficient documentation

## 2010-12-23 DIAGNOSIS — M6281 Muscle weakness (generalized): Secondary | ICD-10-CM | POA: Insufficient documentation

## 2010-12-23 DIAGNOSIS — M2560 Stiffness of unspecified joint, not elsewhere classified: Secondary | ICD-10-CM | POA: Insufficient documentation

## 2010-12-23 DIAGNOSIS — IMO0001 Reserved for inherently not codable concepts without codable children: Secondary | ICD-10-CM | POA: Insufficient documentation

## 2010-12-24 ENCOUNTER — Ambulatory Visit: Payer: Medicare Other | Admitting: Physical Therapy

## 2010-12-24 ENCOUNTER — Encounter: Payer: Medicare Other | Admitting: Physical Therapy

## 2010-12-30 ENCOUNTER — Ambulatory Visit: Payer: Medicare Other | Admitting: Physical Therapy

## 2010-12-31 ENCOUNTER — Ambulatory Visit: Payer: Medicare Other | Admitting: Physical Therapy

## 2011-01-06 ENCOUNTER — Ambulatory Visit: Payer: Medicare Other | Admitting: Physical Therapy

## 2011-01-07 ENCOUNTER — Encounter: Payer: Medicare Other | Admitting: Physical Therapy

## 2011-02-12 ENCOUNTER — Encounter: Payer: Medicare Other | Admitting: Physical Therapy

## 2011-04-22 ENCOUNTER — Other Ambulatory Visit: Payer: Self-pay | Admitting: Family Medicine

## 2011-04-22 DIAGNOSIS — Z1231 Encounter for screening mammogram for malignant neoplasm of breast: Secondary | ICD-10-CM

## 2011-05-31 ENCOUNTER — Encounter (HOSPITAL_COMMUNITY): Payer: Self-pay | Admitting: Emergency Medicine

## 2011-05-31 ENCOUNTER — Emergency Department (HOSPITAL_COMMUNITY): Payer: Medicare Other

## 2011-05-31 ENCOUNTER — Observation Stay (HOSPITAL_COMMUNITY)
Admission: EM | Admit: 2011-05-31 | Discharge: 2011-06-01 | DRG: 313 | Disposition: A | Discharge status: Home / Self Care | Payer: Medicare Other | Source: Ambulatory Visit | Attending: Internal Medicine | Admitting: Internal Medicine

## 2011-05-31 DIAGNOSIS — F411 Generalized anxiety disorder: Secondary | ICD-10-CM | POA: Diagnosis present

## 2011-05-31 DIAGNOSIS — F419 Anxiety disorder, unspecified: Secondary | ICD-10-CM | POA: Diagnosis present

## 2011-05-31 DIAGNOSIS — Z7982 Long term (current) use of aspirin: Secondary | ICD-10-CM

## 2011-05-31 DIAGNOSIS — Z79899 Other long term (current) drug therapy: Secondary | ICD-10-CM

## 2011-05-31 DIAGNOSIS — M542 Cervicalgia: Secondary | ICD-10-CM

## 2011-05-31 DIAGNOSIS — R079 Chest pain, unspecified: Secondary | ICD-10-CM | POA: Diagnosis present

## 2011-05-31 DIAGNOSIS — R0789 Other chest pain: Principal | ICD-10-CM | POA: Diagnosis present

## 2011-05-31 HISTORY — DX: Malignant (primary) neoplasm, unspecified: C80.1

## 2011-05-31 HISTORY — DX: Depression, unspecified: F32.A

## 2011-05-31 HISTORY — DX: Unspecified osteoarthritis, unspecified site: M19.90

## 2011-05-31 HISTORY — DX: Headache: R51

## 2011-05-31 HISTORY — DX: Anxiety disorder, unspecified: F41.9

## 2011-05-31 HISTORY — DX: Major depressive disorder, single episode, unspecified: F32.9

## 2011-05-31 LAB — BASIC METABOLIC PANEL
CO2: 27 mEq/L (ref 19–32)
Glucose, Bld: 96 mg/dL (ref 70–99)
Potassium: 4.4 mEq/L (ref 3.5–5.1)
Sodium: 141 mEq/L (ref 135–145)

## 2011-05-31 LAB — CBC
Hemoglobin: 13.9 g/dL (ref 12.0–15.0)
Hemoglobin: 12.8 g/dL (ref 12.0–15.0)
MCH: 32.5 pg (ref 26.0–34.0)
MCH: 31.4 pg (ref 26.0–34.0)
MCHC: 32.3 g/dL (ref 30.0–36.0)
MCV: 97.1 fL (ref 78.0–100.0)
RBC: 4.28 MIL/uL (ref 3.87–5.11)

## 2011-05-31 LAB — CREATININE, SERUM: Creatinine, Ser: 0.95 mg/dL (ref 0.50–1.10)

## 2011-05-31 LAB — CARDIAC PANEL(CRET KIN+CKTOT+MB+TROPI)
CK, MB: 1.9 ng/mL (ref 0.3–4.0)
Total CK: 34 U/L (ref 7–177)

## 2011-05-31 MED ORDER — NITROGLYCERIN 2 % TD OINT
1.0000 [in_us] | TOPICAL_OINTMENT | Freq: Four times a day (QID) | TRANSDERMAL | Status: DC
  Administered 2011-05-31 – 2011-06-01 (×2): 1 [in_us] via TOPICAL
  Filled 2011-05-31: qty 1
  Filled 2011-05-31: qty 30

## 2011-05-31 MED ORDER — SODIUM CHLORIDE 0.9 % IV SOLN
250.0000 mL | INTRAVENOUS | Status: DC | PRN
  Administered 2011-06-01: 250 mL via INTRAVENOUS

## 2011-05-31 MED ORDER — ONDANSETRON HCL 4 MG/2ML IJ SOLN
4.0000 mg | Freq: Four times a day (QID) | INTRAMUSCULAR | Status: DC | PRN
  Filled 2011-05-31: qty 2

## 2011-05-31 MED ORDER — SODIUM CHLORIDE 0.9 % IJ SOLN
3.0000 mL | Freq: Two times a day (BID) | INTRAMUSCULAR | Status: DC
  Administered 2011-06-01: 3 mL via INTRAVENOUS

## 2011-05-31 MED ORDER — TOPIRAMATE 25 MG PO TABS
25.0000 mg | ORAL_TABLET | Freq: Two times a day (BID) | ORAL | Status: DC
  Administered 2011-05-31 – 2011-06-01 (×2): 25 mg via ORAL
  Filled 2011-05-31 (×3): qty 1

## 2011-05-31 MED ORDER — MORPHINE SULFATE 2 MG/ML IJ SOLN: 2.0000 mg | INTRAMUSCULAR | Status: DC | PRN

## 2011-05-31 MED ORDER — ZOLPIDEM TARTRATE 5 MG PO TABS
5.0000 mg | ORAL_TABLET | Freq: Every evening | ORAL | Status: DC | PRN
  Administered 2011-05-31: 5 mg via ORAL
  Filled 2011-05-31: qty 1

## 2011-05-31 MED ORDER — SODIUM CHLORIDE 0.9 % IJ SOLN: 3.0000 mL | INTRAMUSCULAR | Status: DC | PRN

## 2011-05-31 MED ORDER — ASPIRIN EC 325 MG PO TBEC
325.0000 mg | DELAYED_RELEASE_TABLET | Freq: Every day | ORAL | Status: DC
  Administered 2011-06-01: 325 mg via ORAL
  Filled 2011-05-31: qty 1

## 2011-05-31 MED ORDER — ASPIRIN 81 MG PO CHEW
324.0000 mg | CHEWABLE_TABLET | Freq: Once | ORAL | Status: AC
  Administered 2011-05-31: 324 mg via ORAL
  Filled 2011-05-31: qty 4

## 2011-05-31 MED ORDER — SODIUM CHLORIDE 0.9 % IJ SOLN: 3.0000 mL | Freq: Two times a day (BID) | INTRAMUSCULAR | Status: DC

## 2011-05-31 MED ORDER — TOPIRAMATE 25 MG PO CPSP: 25.0000 mg | ORAL_CAPSULE | Freq: Two times a day (BID) | ORAL | Status: DC

## 2011-05-31 MED ORDER — ENOXAPARIN SODIUM 40 MG/0.4ML ~~LOC~~ SOLN
40.0000 mg | SUBCUTANEOUS | Status: DC
  Administered 2011-05-31: 40 mg via SUBCUTANEOUS
  Filled 2011-05-31 (×2): qty 0.4

## 2011-05-31 MED ORDER — ONDANSETRON HCL 4 MG PO TABS: 4.0000 mg | ORAL_TABLET | Freq: Four times a day (QID) | ORAL | Status: DC | PRN

## 2011-05-31 NOTE — ED Notes (Signed)
Yesterday started to have tightness in chest; took pepto, reports had same tightness today, and reports High BP today; went to Dr Harrington Challenger today, and sent to Ed, reports that he called Dr Marlou Porch; denies SOB, n, 1 nitro given in PCP office

## 2011-05-31 NOTE — ED Provider Notes (Signed)
History     CSN: [id number]  Arrival date & time 05/31/11  1629   First MD Initiated Contact with Patient 05/31/11 1814      Chief Complaint  Patient presents with  . Chest Pain    (Consider location/radiation/quality/duration/timing/severity/associated sxs/prior treatment) HPI Pt reports she had an episode of moderate aching midsternal chest pain last night, possibly improved with pepto-bismol but returned when she woke up this morning. Pain is not associated with SOB and non-radiating. She had pain persisting through the day and went to her PCP for evaluation this afternoon. He gave her a single NTG with good improvement in symptoms. She is pain free now. Has not had ASA today. No prior history of CAD. No recent stress tests and no prior heart caths.   Past Medical History  Diagnosis Date  . Arthritis     Past Surgical History  Procedure Date  . Leg surgery     History reviewed. No pertinent family history.  History  Substance Use Topics  . Smoking status: Never Smoker   . Smokeless tobacco: Not on file  . Alcohol Use: No     occasion    OB History    Grav Para Term Preterm Abortions TAB SAB Ect Mult Living                  Review of Systems All other systems reviewed and are negative except as noted in HPI.   Allergies  Review of patient's allergies indicates no known allergies.  Home Medications   Current Outpatient Rx  Name Route Sig Dispense Refill  . ASPIRIN 81 MG PO CHEW Oral Chew 81 mg by mouth daily.    Marland Kitchen VITAMIN B 12 PO Oral Take 1 tablet by mouth daily.    Marland Kitchen GLUCOSAMINE PO Oral Take 1 tablet by mouth daily. Hold while in hospital    . ADULT MULTIVITAMIN W/MINERALS CH Oral Take 1 tablet by mouth daily.    Marland Kitchen NAPROXEN SODIUM 220 MG PO TABS Oral Take 220 mg by mouth 2 (two) times daily with a meal.    . TOPIRAMATE 25 MG PO CPSP Oral Take 25 mg by mouth 2 (two) times daily.      BP 151/66  Pulse 89  Temp(Src) 98.3 F (36.8 C) (Oral)  Resp 18   SpO2 100%  Physical Exam  Nursing note and vitals reviewed. Constitutional: She is oriented to person, place, and time. She appears well-developed and well-nourished.  HENT:  Head: Normocephalic and atraumatic.  Eyes: EOM are normal. Pupils are equal, round, and reactive to light.  Neck: Normal range of motion. Neck supple.  Cardiovascular: Normal rate, normal heart sounds and intact distal pulses.   Pulmonary/Chest: Effort normal and breath sounds normal.  Abdominal: Bowel sounds are normal. She exhibits no distension. There is no tenderness.  Musculoskeletal: Normal range of motion. She exhibits no edema and no tenderness.  Neurological: She is alert and oriented to person, place, and time. She has normal strength. No cranial nerve deficit or sensory deficit.  Skin: Skin is warm and dry. No rash noted.  Psychiatric: She has a normal mood and affect.    ED Course  Procedures (including critical care time)  Labs Reviewed  BASIC METABOLIC PANEL - Abnormal; Notable for the following:    GFR calc non Af Amer 58 (*)    GFR calc Af Amer 68 (*)    All other components within normal limits  CBC  TROPONIN I  Dg Chest 2 View  05/31/2011  *RADIOLOGY REPORT*  Clinical Data: Chest pain.  Abnormal EKG.  CHEST - 2 VIEW  Comparison: 09/19/2003  Findings: Elevated left hemidiaphragm noted as shown on the prior exam from 2005.  Mild cardiomegaly noted with tortuosity of the thoracic aorta. Thoracic and upper lumbar spondylosis noted without compression fracture.  Linear atelectasis noted along the margin of the elevated left hemidiaphragm.  No pleural effusion observed.  Degenerative AC joint spurring noted bilaterally.  IMPRESSION:  1.  Chronically elevated left hemidiaphragm with scarring or atelectasis in the left lower lobe. 2.  Thoracic and lumbar spondylosis.  Original Report Authenticated By: Carron Curie, M.D.     1. Chest pain       MDM   Date: 05/31/2011  Rate: 88  Rhythm:  normal sinus rhythm  QRS Axis: normal  Intervals: normal  ST/T Wave abnormalities: normal  Conduction Disutrbances:none  Narrative Interpretation:   Old EKG Reviewed: unchanged  8:30 PM Labs and imaging unremarkable. Spoke with Dr. Irish Lack with [facility] who agrees with admission to Hospitalist for rule out and to arrange for stress testing tomorrow.        Leta Bucklin B. Karle Starch, MD 05/31/11 2031

## 2011-05-31 NOTE — ED Notes (Signed)
[id number] Ready

## 2011-05-31 NOTE — ED Notes (Signed)
Caring for pts while Zena RN is on break. Currently pt is alert and oriented. No pain at this time. No respiratory or cardiac distress. Will continue to monitor.

## 2011-05-31 NOTE — H&P (Signed)
PCP:   Lawerance Cruel, MD, MD   Chief Complaint: Chest pain   HPI: Alexandra [name] is an 78 y.o. female with benign past medical history, on no chronic medications except for topiramate for left-sided neck pain, along with a baby aspirin per day, present to her primary care physician complaining of chest pain for 2 days. She states that her pain started yesterday, described as substernal and constant, not associated with breathing, having no exertional component, non-positional, nonradiating. She also experienced similar pain today, and presents to her doctor who subsequently referred to the emergency room. She denied epigastric pain, shortness of breath, nausea, vomiting, fever, chills, or any other symptomology. She has history of elevated cholesterol, but not on any medication because her HDL was elevated as she was told.  She was given a sublingual nitroglycerin and her primary care physician's office and her pain did resolve. Evaluation in the emergency room included an EKG which showed sinus rhythm, nonspecific ST-T changes, no acute elevation or depression. The serology was unremarkable with normal white count, hemoglobin, creatinine, and negative cardiac markers. A chest x-ray did not show any acute process. Hospitalist was asked to admit patient for cardiac rule out. Cardiology was also consulted over the phone, and deferred admission to hospitalist.  She has no leg swelling or calf tenderness. She denied distant travel, or any recent surgery.   Rewiew of Systems:  The patient denies anorexia, fever, weight loss,, vision loss, decreased hearing, hoarseness, syncope, dyspnea on exertion, peripheral edema, balance deficits, hemoptysis, abdominal pain, melena, hematochezia, severe indigestion/heartburn, hematuria, incontinence, genital sores, muscle weakness, suspicious skin lesions, transient blindness, difficulty walking, depression, unusual weight change, abnormal bleeding, enlarged lymph  nodes, angioedema, and breast masses.    Past Medical History  Diagnosis Date  . Arthritis     Past Surgical History  Procedure Date  . Leg surgery     Medications:  HOME MEDS: Prior to Admission medications   Medication Sig Start Date End Date Taking? Authorizing Provider  aspirin 81 MG chewable tablet Chew 81 mg by mouth daily.   Yes Historical Provider, MD  Cyanocobalamin (VITAMIN B 12 PO) Take 1 tablet by mouth daily.   Yes Historical Provider, MD  GLUCOSAMINE PO Take 1 tablet by mouth daily. Hold while in hospital   Yes Historical Provider, MD  Multiple Vitamin (MULITIVITAMIN WITH MINERALS) TABS Take 1 tablet by mouth daily.   Yes Historical Provider, MD  naproxen sodium (ANAPROX) 220 MG tablet Take 220 mg by mouth 2 (two) times daily with a meal.   Yes Historical Provider, MD  topiramate (TOPAMAX) 25 MG capsule Take 25 mg by mouth 2 (two) times daily.   Yes Historical Provider, MD     Allergies:  No Known Allergies  Social History:   reports that she has never smoked. She does not have any smokeless tobacco history on file. She reports that she does not drink alcohol or use illicit drugs.  Family History: History reviewed. No pertinent family history.   Physical Exam: Filed Vitals:   05/31/11 1641 05/31/11 1912  BP: 151/66 130/52  Pulse: 89 68  Temp: 98.3 F (36.8 C)   TempSrc: Oral   Resp: 18 11  SpO2: 100% 99%   Blood pressure 130/52, pulse 68, temperature 98.3 F (36.8 C), temperature source Oral, resp. rate 11, SpO2 99.00%.  GEN:  Pleasant  person lying in the stretcher in no acute distress; cooperative with exam PSYCH:  alert and oriented x4; does not appear  anxious or depressed; affect is appropriate. HEENT: Mucous membranes pink and anicteric; PERRLA; EOM intact; no cervical lymphadenopathy nor thyromegaly or carotid bruit; no JVD; Breasts:: Not examined CHEST WALL: No tenderness CHEST: Normal respiration, clear to auscultation bilaterally HEART:  Regular rate and rhythm; no murmurs rubs or gallops BACK: No kyphosis or scoliosis; no CVA tenderness ABDOMEN: Obese, soft non-tender; no masses, no organomegaly, normal abdominal bowel sounds; no pannus; no intertriginous candida. Rectal Exam: Not done EXTREMITIES: No bone, age-appropriate arthropathy of the hands and knees; no edema; no ulcerations. She does have arthritic changes seen especially in her hands. Genitalia: not examined PULSES: 2+ and symmetric SKIN: Normal hydration no rash or ulceration CNS: Cranial nerves 2-12 grossly intact no focal lateralizing neurologic deficit   Labs & Imaging Results for orders placed during the hospital encounter of 05/31/11 (from the past 48 hour(s))  CBC     Status: Normal   Collection Time   05/31/11  6:15 PM      Component Value Range Comment   WBC 8.9  4.0 - 10.5 (K/uL)    RBC 4.28  3.87 - 5.11 (MIL/uL)    Hemoglobin 13.9  12.0 - 15.0 (g/dL)    HCT 40.8  36.0 - 46.0 (%)    MCV 95.3  78.0 - 100.0 (fL)    MCH 32.5  26.0 - 34.0 (pg)    MCHC 34.1  30.0 - 36.0 (g/dL)    RDW 13.9  11.5 - 15.5 (%)    Platelets 199  150 - 400 (K/uL)   BASIC METABOLIC PANEL     Status: Abnormal   Collection Time   05/31/11  6:15 PM      Component Value Range Comment   Sodium 141  135 - 145 (mEq/L)    Potassium 4.4  3.5 - 5.1 (mEq/L)    Chloride 105  96 - 112 (mEq/L)    CO2 27  19 - 32 (mEq/L)    Glucose, Bld 96  70 - 99 (mg/dL)    BUN 23  6 - 23 (mg/dL)    Creatinine, Ser 0.90  0.50 - 1.10 (mg/dL)    Calcium 9.9  8.4 - 10.5 (mg/dL)    GFR calc non Af Amer 58 (*) >90 (mL/min)    GFR calc Af Amer 68 (*) >90 (mL/min)   TROPONIN I     Status: Normal   Collection Time   05/31/11  6:15 PM      Component Value Range Comment   Troponin I <0.30  <0.30 (ng/mL)    Dg Chest 2 View  05/31/2011  *RADIOLOGY REPORT*  Clinical Data: Chest pain.  Abnormal EKG.  CHEST - 2 VIEW  Comparison: 09/19/2003  Findings: Elevated left hemidiaphragm noted as shown on the prior exam  from 2005.  Mild cardiomegaly noted with tortuosity of the thoracic aorta. Thoracic and upper lumbar spondylosis noted without compression fracture.  Linear atelectasis noted along the margin of the elevated left hemidiaphragm.  No pleural effusion observed.  Degenerative AC joint spurring noted bilaterally.  IMPRESSION:  1.  Chronically elevated left hemidiaphragm with scarring or atelectasis in the left lower lobe. 2.  Thoracic and lumbar spondylosis.  Original Report Authenticated By: Carron Curie, M.D.      Assessment Present on Admission:  .Chest pain at rest .Anxiety Arthritis  PLAN:  This patient presents with atypical chest pain needing to be ruled out. She does have some elevation of her cardiac risks, but I do not think that  this is acute coronary syndrome. Her blood pressure slightly elevated, and we will follow. We'll continue her nitro paste, along with aspirin, and her topiramate. We'll use morphine as needed for chest pain. We'll get serial CPKs and troponin, along with cardiac echo.  Risk modification discussed. She is stable, full code, and will be admitted to [facility].  Other plans as per orders.    Alexandra [name] 05/31/2011, 8:39 PM

## 2011-06-01 ENCOUNTER — Inpatient Hospital Stay (HOSPITAL_COMMUNITY): Payer: Medicare Other

## 2011-06-01 ENCOUNTER — Inpatient Hospital Stay: Payer: Medicare Other

## 2011-06-01 ENCOUNTER — Encounter (HOSPITAL_COMMUNITY): Payer: Self-pay | Admitting: Cardiology

## 2011-06-01 DIAGNOSIS — R079 Chest pain, unspecified: Secondary | ICD-10-CM

## 2011-06-01 DIAGNOSIS — F411 Generalized anxiety disorder: Secondary | ICD-10-CM

## 2011-06-01 LAB — CBC
HCT: 41.1 % (ref 36.0–46.0)
MCHC: 33 g/dL (ref 30.0–36.0)
MCHC: 33.8 g/dL (ref 30.0–36.0)
MCV: 95.4 fL (ref 78.0–100.0)
Platelets: 168 10*3/uL (ref 150–400)
Platelets: 181 10*3/uL (ref 150–400)
RDW: 13.8 % (ref 11.5–15.5)
RDW: 13.6 % (ref 11.5–15.5)
WBC: 7.2 10*3/uL (ref 4.0–10.5)
WBC: 9.2 10*3/uL (ref 4.0–10.5)

## 2011-06-01 LAB — CARDIAC PANEL(CRET KIN+CKTOT+MB+TROPI)
CK, MB: 1.8 ng/mL (ref 0.3–4.0)
Relative Index: INVALID (ref 0.0–2.5)
Relative Index: INVALID (ref 0.0–2.5)
Total CK: 24 U/L (ref 7–177)
Total CK: 31 U/L (ref 7–177)

## 2011-06-01 LAB — BASIC METABOLIC PANEL
BUN: 28 mg/dL — ABNORMAL HIGH (ref 6–23)
CO2: 25 mEq/L (ref 19–32)
Chloride: 106 mEq/L (ref 96–112)
GFR calc non Af Amer: 53 mL/min — ABNORMAL LOW (ref >90)
Glucose, Bld: 115 mg/dL — ABNORMAL HIGH (ref 70–99)
Potassium: 4.3 mEq/L (ref 3.5–5.1)

## 2011-06-01 MED ORDER — TECHNETIUM TC 99M TETROFOSMIN IV KIT
30.0000 | PACK | Freq: Once | INTRAVENOUS | Status: AC | PRN
  Administered 2011-06-01: 30 via INTRAVENOUS

## 2011-06-01 MED ORDER — ACETAMINOPHEN 325 MG PO TABS
ORAL_TABLET | ORAL | Status: AC
  Administered 2011-06-01: 650 mg
  Filled 2011-06-01: qty 2

## 2011-06-01 MED ORDER — TECHNETIUM TC 99M TETROFOSMIN IV KIT
10.0000 | PACK | Freq: Once | INTRAVENOUS | Status: AC | PRN
  Administered 2011-06-01: 10 via INTRAVENOUS

## 2011-06-01 MED ORDER — PANTOPRAZOLE SODIUM 40 MG PO TBEC: 40.0000 mg | DELAYED_RELEASE_TABLET | Freq: Every day | ORAL | Status: DC

## 2011-06-01 MED ORDER — NITROGLYCERIN 0.4 MG SL SUBL: 0.4000 mg | SUBLINGUAL_TABLET | SUBLINGUAL | Status: DC | PRN

## 2011-06-01 MED ORDER — REGADENOSON 0.4 MG/5ML IV SOLN
0.4000 mg | Freq: Once | INTRAVENOUS | Status: AC
  Administered 2011-06-01: 0.4 mg via INTRAVENOUS

## 2011-06-01 MED ORDER — ACETAMINOPHEN 325 MG PO TABS: 650.0000 mg | ORAL_TABLET | Freq: Once | ORAL | Status: DC

## 2011-06-01 NOTE — Discharge Summary (Signed)
Physician Discharge Summary  Patient ID: Alexandra [name] MRN: [id number] DOB/AGE: [birth year] 76 y.o.  Admit date: 05/31/2011 Discharge date: 06/01/2011  Primary Care Physician:  Lawerance Cruel, MD, MD  Discharge Diagnoses:    .Chest pain at rest/atypical chest pain resolved, stress test 06/01/2011 showed no reversible ischemia  .Anxiety  Consults:  [facility], Dr. Golden Hurter  Discharge Medications: Medication List  As of 06/01/2011  3:34 PM   TAKE these medications         aspirin 81 MG chewable tablet   Chew 81 mg by mouth daily.      GLUCOSAMINE PO   Take 1 tablet by mouth daily. Hold while in hospital      mulitivitamin with minerals Tabs   Take 1 tablet by mouth daily.      naproxen sodium 220 MG tablet   Commonly known as: ANAPROX   Take 220 mg by mouth 2 (two) times daily with a meal.      nitroGLYCERIN 0.4 MG SL tablet   Commonly known as: NITROSTAT   Place 1 tablet (0.4 mg total) under the tongue every 5 (five) minutes as needed for chest pain.      pantoprazole 40 MG tablet   Commonly known as: PROTONIX   Take 1 tablet (40 mg total) by mouth daily.      topiramate 25 MG capsule   Commonly known as: TOPAMAX   Take 25 mg by mouth 2 (two) times daily.      VITAMIN B 12 PO   Take 1 tablet by mouth daily.             Brief H and P: For complete details please refer to admission H and P, but in brief Alexandra [name] is an 78 y.o. female with benign past medical history, on no chronic medications except for topiramate for left-sided neck pain, along with a baby aspirin per day, presented to her PCP complaining of chest pain for 2 days. She stated that her pain started the day before the admission and described as substernal and constant, not associated with breathing, having no exertional component, non-positional, nonradiating. She also experienced similar pain on the day of admission and presented to her doctor who subsequently referred to the  emergency room. She denied epigastric pain, shortness of breath, nausea, vomiting, fever, chills, or any other symptomology. She has history of elevated cholesterol, but not on any medication because her HDL was elevated as she was told. She was given a sublingual nitroglycerin and her primary care physician's office and her pain did resolve. Evaluation in the emergency room included an EKG which showed sinus rhythm, nonspecific ST-T changes, no acute elevation or depression. The serology was unremarkable with normal white count, hemoglobin, creatinine, and negative cardiac markers. A chest x-ray did not show any acute process. Hospitalist was asked to admit patient for cardiac rule out. Cardiology was also consulted over the phone, and deferred admission to hospitalist.   Hospital Course:  Chest pain: Atypical, patient was admitted to the hospital service. Cardiology was consulted and patient was seen by Dr. Golden Hurter. Cardiac enzymes remained negative, patient had noted episodes of chest pain, EKG remained nonspecific/nonischemic. 2-D echo and stress test were ordered per cardiology recommendations. Stress test done on 06/01/2011 was negative for ischemia, revealed normal ejection fraction of 62%. Patient was discharged home at this point, 2-D echo results to be followed by PCP and Dr. Radford Pax.  Day of Discharge BP 146/76  Pulse 76  Temp(Src) 97.8 F (36.6 C) (Oral)  Resp 20  Ht 5' 4.5" (1.638 m)  Wt 95.6 kg (210 lb 12.2 oz)  BMI 35.62 kg/m2  SpO2 100%  Physical Exam: General: Alert and awake oriented x3 not in any acute distress. HEENT: anicteric sclera, pupils reactive to light and accommodation CVS: S1-S2 clear no murmur rubs or gallops Chest: clear to auscultation bilaterally, no wheezing rales or rhonchi Abdomen: soft nontender, nondistended, normal bowel sounds, no organomegaly Extremities: no cyanosis, clubbing or edema noted bilaterally Neuro: Cranial nerves II-XII intact, no focal  neurological deficits   The results of significant diagnostics from this hospitalization (including imaging, microbiology, ancillary and laboratory) are listed below for reference.    LAB RESULTS: Basic Metabolic Panel:  Lab [id number] 05/31/11 2134 05/31/11 1815  NA 142 -- 141  K 4.3 -- 4.4  CL 106 -- 105  CO2 25 -- 27  GLUCOSE 115* -- 96  BUN 28* -- 23  CREATININE 0.97 0.95 --  CALCIUM 9.4 -- 9.9  MG -- -- --  PHOS -- -- --   CBC:  Lab 06/01/11 0500 05/31/11 2134  WBC 7.2 8.3  NEUTROABS -- --  HGB 12.5 12.8  HCT 37.9 39.6  MCV 95.9 --  PLT 168 176   Cardiac Enzymes:  Lab 06/01/11 1324 06/01/11 0528  CKTOTAL 31 24  CKMB 1.8 1.8  CKMBINDEX -- --  TROPONINI <0.30 <0.30    Significant Diagnostic Studies:  No results found.   Disposition and Follow-up: Discharge Orders    Future Orders Please Complete By Expires   Diet - low sodium heart healthy      Increase activity slowly          DISPOSITION: Home  DIET: Heart healthy   ACTIVITY: As tolerated    DISCHARGE FOLLOW-UP Follow-up Information    Follow up with Lawerance Cruel, MD. Schedule an appointment as soon as possible for a visit in 10 days. (for hospital follow-up)    Contact information:   [place] [phone]          Time spent on Discharge: 45 minutes  Signed:  Travarus Trudo M.D. [facility] 06/01/2011, 3:34 PM

## 2011-06-01 NOTE — Consult Note (Signed)
Admit date: 05/31/2011 Referring Physician  Dr. Tana Coast Primary Physician  Dr. Harrington Challenger Reason for Consultation  Atypical chest pain  HPI: Alexandra [name] is an 78 y.o. female with benign past medical history, on no chronic medications except for topiramate for left-sided neck pain, along with a baby aspirin per day, present to her primary care physician complaining of chest pain for 2 days. She states that her pain started yesterday, described as substernal and constant, not associated with breathing, having no exertional component, non-positional, nonradiating. She also experienced similar pain today, and presents to her doctor who subsequently referred to the emergency room. She denied epigastric pain, shortness of breath, nausea, vomiting, fever, chills, or any other symptomology. She has history of elevated cholesterol, but not on any medication because her HDL was elevated as she was told. She was given a sublingual nitroglycerin and her primary care physician's office and her pain did resolve. Evaluation in the emergency room included an EKG which showed sinus rhythm, nonspecific ST-T changes, no acute elevation or depression. The serology was unremarkable with normal white count, hemoglobin, creatinine, and negative cardiac markers. A chest x-ray did not show any acute process. Hospitalist was asked to admit patient for cardiac rule out. Cardiology was also consulted over the phone, and deferred admission to hospitalist. She has no leg swelling or calf tenderness. She denied distant travel, or any recent surgery.   This am she denies any further chest pain.  We are now asked to consult for risk stratification.        PMH:   Past Medical History  Diagnosis Date  . Arthritis   . Headache   . Cancer   . Anxiety   . Depression      PSH:   Past Surgical History  Procedure Date  . Leg surgery     Allergies:  Review of patient's allergies indicates no known allergies. Prior to Admit Meds:     Prescriptions prior to admission  Medication Sig Dispense Refill  . aspirin 81 MG chewable tablet Chew 81 mg by mouth daily.      . Cyanocobalamin (VITAMIN B 12 PO) Take 1 tablet by mouth daily.      Marland Kitchen GLUCOSAMINE PO Take 1 tablet by mouth daily. Hold while in hospital      . Multiple Vitamin (MULITIVITAMIN WITH MINERALS) TABS Take 1 tablet by mouth daily.      . naproxen sodium (ANAPROX) 220 MG tablet Take 220 mg by mouth 2 (two) times daily with a meal.      . topiramate (TOPAMAX) 25 MG capsule Take 25 mg by mouth 2 (two) times daily.       Fam HX:   History reviewed. No pertinent family history. Social HX:    History   Social History  . Marital Status: Married    Spouse Name: N/A    Number of Children: N/A  . Years of Education: N/A   Occupational History  . Not on file.   Social History Main Topics  . Smoking status: Never Smoker   . Smokeless tobacco: Never Used  . Alcohol Use: No     occasion  . Drug Use: No  . Sexually Active:    Other Topics Concern  . Not on file   Social History Narrative  . No narrative on file     ROS:  All 11 ROS were addressed and are negative except what is stated in the HPI  Physical Exam: Blood pressure 133/57, pulse  72, temperature 97.8 F (36.6 C), temperature source Oral, resp. rate 18, height 5' 4.5" (1.638 m), weight 95.6 kg (210 lb 12.2 oz), SpO2 96.00%.    General: Well developed, well nourished, in no acute distress Head: Eyes PERRLA, No xanthomas.   Normal cephalic and atramatic  Lungs:   Clear bilaterally to auscultation and percussion. Heart:   HRRR S1 S2 Pulses are 2+ & equal. Abdomen: Bowel sounds are positive, abdomen soft and non-tender without masses  Extremities:   No clubbing, cyanosis or edema.  DP +1 Neuro: Alert and oriented X 3. Psych:  Good affect, responds appropriately    Labs:   Lab Results  Component Value Date   WBC 7.2 06/01/2011   HGB 12.5 06/01/2011   HCT 37.9 06/01/2011   MCV 95.9 06/01/2011    PLT 168 06/01/2011    Lab 06/01/11 0500  NA 142  K 4.3  CL 106  CO2 25  BUN 28*  CREATININE 0.97  CALCIUM 9.4  PROT --  BILITOT --  ALKPHOS --  ALT --  AST --  GLUCOSE 115*   No results found for this basename: PTT   No results found for this basename: INR, PROTIME   Lab Results  Component Value Date   CKTOTAL 24 06/01/2011   CKMB 1.8 06/01/2011   TROPONINI <0.30 06/01/2011     No results found for this basename: CHOL   No results found for this basename: HDL   No results found for this basename: LDLCALC   No results found for this basename: TRIG   No results found for this basename: CHOLHDL   No results found for this basename: LDLDIRECT      Radiology:  Dg Chest 2 View  05/31/2011  *RADIOLOGY REPORT*  Clinical Data: Chest pain.  Abnormal EKG.  CHEST - 2 VIEW  Comparison: 09/19/2003  Findings: Elevated left hemidiaphragm noted as shown on the prior exam from 2005.  Mild cardiomegaly noted with tortuosity of the thoracic aorta. Thoracic and upper lumbar spondylosis noted without compression fracture.  Linear atelectasis noted along the margin of the elevated left hemidiaphragm.  No pleural effusion observed.  Degenerative AC joint spurring noted bilaterally.  IMPRESSION:  1.  Chronically elevated left hemidiaphragm with scarring or atelectasis in the left lower lobe. 2.  Thoracic and lumbar spondylosis.  Original Report Authenticated By: Carron Curie, M.D.    EKG:  NSR with LVH  ASSESSMENT:  1.  Atypical chest pain with negative cardiac enzymes x 3 and no further chest pain.  EKG is nonischemic.  PLAN:   1.  Lexiscan cardiolyte to rule out ischemia  Sueanne Margarita, MD  06/01/2011  9:52 AM

## 2011-06-01 NOTE — Progress Notes (Signed)
*  PRELIMINARY RESULTS* Echocardiogram 2D Echocardiogram has been performed.  Roxine Caddy [facility] 06/01/2011, 1:07 PM

## 2011-06-01 NOTE — Progress Notes (Signed)
UR Completed Tomoya Ringwald Graves-Bigelow, RN,BSN [phone]  

## 2011-06-01 NOTE — Progress Notes (Signed)
Pt. C/o chest pain in center of chest that is different from before. B/P 153/71 HR 79. Rhythm Normal sinus.

## 2011-06-01 NOTE — Care Management Note (Unsigned)
    Page 1 of 1   06/01/2011     3:18:11 PM   CARE MANAGEMENT NOTE 06/01/2011  Patient:  Alexandra [name], Alexandra [name]   Account Number:  [id number]  Date Initiated:  06/01/2011  Documentation initiated by:  GRAVES-BIGELOW,Tishie Altmann  Subjective/Objective Assessment:   Pt admitted with cp. Plan for stress test.     Action/Plan:   Anticipated DC Date:  06/01/2011   Anticipated DC Plan:  [place]  CM consult      Choice offered to / List presented to:             Status of service:  In process, will continue to follow Medicare Important Message given?   (If response is "NO", the following Medicare IM given date fields will be blank) Date Medicare IM given:   Date Additional Medicare IM given:    Discharge Disposition:    Per UR Regulation:    If discussed at Long Length of Stay Meetings, dates discussed:    Comments:  06-01-11 1517 Jacqlyn Krauss, RN,BSN [phone] CM will continue to monitor for disposition needs.

## 2011-06-01 NOTE — Progress Notes (Signed)
Was informed by pt, that she experienced CP in stress test after consuming a coke and crackers, but after burping, said CP was relieved; currently in no CP; pt states she still has a headache; VS stable, Dr.Turner called and made aware, said ok to d/c nitro paste; will continue to monitor

## 2011-06-01 NOTE — Progress Notes (Signed)
Patient complaining of having 2 black stools this afternoon.  Dr. Tana Coast paged and notified, orders received for stat CBC prior to discharge.  H/H this afternoon 13.9/41.1.  Patient ok for discharge per Dr. Tana Coast.  Reviewed discharge instructions with patient and husband, they stated their understanding.  Sanda Linger

## 2011-06-30 ENCOUNTER — Ambulatory Visit
Admission: RE | Admit: 2011-06-30 | Discharge: 2011-06-30 | Disposition: A | Discharge status: Home / Self Care | Payer: Medicare Other | Source: Ambulatory Visit | Attending: Family Medicine | Admitting: Family Medicine

## 2011-06-30 DIAGNOSIS — Z1231 Encounter for screening mammogram for malignant neoplasm of breast: Secondary | ICD-10-CM

## 2011-11-24 ENCOUNTER — Ambulatory Visit: Payer: Medicare Other | Attending: Family Medicine | Admitting: Physical Therapy

## 2011-11-24 DIAGNOSIS — M545 Low back pain, unspecified: Secondary | ICD-10-CM | POA: Insufficient documentation

## 2011-11-24 DIAGNOSIS — IMO0001 Reserved for inherently not codable concepts without codable children: Secondary | ICD-10-CM | POA: Insufficient documentation

## 2011-11-24 DIAGNOSIS — M25569 Pain in unspecified knee: Secondary | ICD-10-CM | POA: Insufficient documentation

## 2011-11-29 ENCOUNTER — Ambulatory Visit: Payer: Medicare Other | Admitting: Physical Therapy

## 2011-12-01 ENCOUNTER — Ambulatory Visit: Payer: Medicare Other | Admitting: Physical Therapy

## 2011-12-08 ENCOUNTER — Ambulatory Visit: Payer: Medicare Other | Admitting: Physical Therapy

## 2011-12-14 ENCOUNTER — Ambulatory Visit: Payer: Medicare Other | Admitting: Physical Therapy

## 2011-12-23 ENCOUNTER — Ambulatory Visit: Payer: Medicare Other | Attending: Family Medicine | Admitting: Physical Therapy

## 2011-12-23 DIAGNOSIS — M545 Low back pain, unspecified: Secondary | ICD-10-CM | POA: Insufficient documentation

## 2011-12-23 DIAGNOSIS — IMO0001 Reserved for inherently not codable concepts without codable children: Secondary | ICD-10-CM | POA: Insufficient documentation

## 2011-12-23 DIAGNOSIS — M25569 Pain in unspecified knee: Secondary | ICD-10-CM | POA: Insufficient documentation

## 2011-12-29 ENCOUNTER — Ambulatory Visit: Payer: Medicare Other | Admitting: Physical Therapy

## 2012-01-03 ENCOUNTER — Ambulatory Visit: Payer: Medicare Other | Admitting: Physical Therapy

## 2012-05-25 ENCOUNTER — Other Ambulatory Visit: Payer: Self-pay

## 2012-05-25 DIAGNOSIS — Z1231 Encounter for screening mammogram for malignant neoplasm of breast: Secondary | ICD-10-CM

## 2012-07-17 ENCOUNTER — Ambulatory Visit
Admission: RE | Admit: 2012-07-17 | Discharge: 2012-07-17 | Disposition: A | Discharge status: Home / Self Care | Payer: Medicare Other | Source: Ambulatory Visit

## 2012-07-17 DIAGNOSIS — Z1231 Encounter for screening mammogram for malignant neoplasm of breast: Secondary | ICD-10-CM

## 2012-12-18 ENCOUNTER — Encounter (HOSPITAL_COMMUNITY): Payer: Self-pay | Admitting: Emergency Medicine

## 2012-12-18 ENCOUNTER — Emergency Department (HOSPITAL_COMMUNITY): Payer: Medicare Other

## 2012-12-18 ENCOUNTER — Inpatient Hospital Stay (HOSPITAL_COMMUNITY)
Admission: EM | Admit: 2012-12-18 | Discharge: 2012-12-21 | DRG: 871 | Disposition: A | Discharge status: Home / Self Care | Payer: Medicare Other | Attending: Internal Medicine | Admitting: Internal Medicine

## 2012-12-18 DIAGNOSIS — F3289 Other specified depressive episodes: Secondary | ICD-10-CM | POA: Diagnosis present

## 2012-12-18 DIAGNOSIS — R079 Chest pain, unspecified: Secondary | ICD-10-CM | POA: Diagnosis present

## 2012-12-18 DIAGNOSIS — R9431 Abnormal electrocardiogram [ECG] [EKG]: Secondary | ICD-10-CM

## 2012-12-18 DIAGNOSIS — M129 Arthropathy, unspecified: Secondary | ICD-10-CM | POA: Diagnosis present

## 2012-12-18 DIAGNOSIS — R0789 Other chest pain: Secondary | ICD-10-CM

## 2012-12-18 DIAGNOSIS — R112 Nausea with vomiting, unspecified: Secondary | ICD-10-CM

## 2012-12-18 DIAGNOSIS — Z7982 Long term (current) use of aspirin: Secondary | ICD-10-CM

## 2012-12-18 DIAGNOSIS — I10 Essential (primary) hypertension: Secondary | ICD-10-CM | POA: Diagnosis present

## 2012-12-18 DIAGNOSIS — R6510 Systemic inflammatory response syndrome (SIRS) of non-infectious origin without acute organ dysfunction: Secondary | ICD-10-CM

## 2012-12-18 DIAGNOSIS — K5900 Constipation, unspecified: Secondary | ICD-10-CM | POA: Diagnosis present

## 2012-12-18 DIAGNOSIS — F411 Generalized anxiety disorder: Secondary | ICD-10-CM | POA: Diagnosis present

## 2012-12-18 DIAGNOSIS — F329 Major depressive disorder, single episode, unspecified: Secondary | ICD-10-CM | POA: Diagnosis present

## 2012-12-18 DIAGNOSIS — R000 Tachycardia, unspecified: Secondary | ICD-10-CM | POA: Diagnosis present

## 2012-12-18 DIAGNOSIS — J45909 Unspecified asthma, uncomplicated: Secondary | ICD-10-CM | POA: Diagnosis present

## 2012-12-18 DIAGNOSIS — M542 Cervicalgia: Secondary | ICD-10-CM

## 2012-12-18 DIAGNOSIS — Z79899 Other long term (current) drug therapy: Secondary | ICD-10-CM

## 2012-12-18 DIAGNOSIS — F419 Anxiety disorder, unspecified: Secondary | ICD-10-CM

## 2012-12-18 DIAGNOSIS — A419 Sepsis, unspecified organism: Principal | ICD-10-CM | POA: Diagnosis present

## 2012-12-18 DIAGNOSIS — I447 Left bundle-branch block, unspecified: Secondary | ICD-10-CM | POA: Diagnosis present

## 2012-12-18 DIAGNOSIS — J1100 Influenza due to unidentified influenza virus with unspecified type of pneumonia: Secondary | ICD-10-CM | POA: Diagnosis present

## 2012-12-18 DIAGNOSIS — J189 Pneumonia, unspecified organism: Secondary | ICD-10-CM

## 2012-12-18 HISTORY — DX: Left bundle-branch block, unspecified: I44.7

## 2012-12-18 HISTORY — DX: Unspecified asthma, uncomplicated: J45.909

## 2012-12-18 HISTORY — DX: Pneumonia, unspecified organism: J18.9

## 2012-12-18 LAB — BASIC METABOLIC PANEL
CO2: 22 mEq/L (ref 19–32)
Calcium: 9.5 mg/dL (ref 8.4–10.5)
Chloride: 103 mEq/L (ref 96–112)
Creatinine, Ser: 0.92 mg/dL (ref 0.50–1.10)
GFR calc Af Amer: 65 mL/min — ABNORMAL LOW (ref >90)
GFR calc non Af Amer: 56 mL/min — ABNORMAL LOW (ref >90)
Glucose, Bld: 125 mg/dL — ABNORMAL HIGH (ref 70–99)
Potassium: 4.2 mEq/L (ref 3.5–5.1)

## 2012-12-18 LAB — CBC
Hemoglobin: 13.8 g/dL (ref 12.0–15.0)
MCH: 32.3 pg (ref 26.0–34.0)
MCV: 94.8 fL (ref 78.0–100.0)
Platelets: 189 10*3/uL (ref 150–400)
RBC: 4.27 MIL/uL (ref 3.87–5.11)
RDW: 13.5 % (ref 11.5–15.5)
WBC: 9.5 10*3/uL (ref 4.0–10.5)

## 2012-12-18 LAB — POCT I-STAT TROPONIN I: Troponin i, poc: 0.02 ng/mL (ref 0.00–0.08)

## 2012-12-18 LAB — PROTIME-INR
INR: 1.08 (ref 0.00–1.49)
Prothrombin Time: 13.8 seconds (ref 11.6–15.2)

## 2012-12-18 LAB — PRO B NATRIURETIC PEPTIDE: Pro B Natriuretic peptide (BNP): 173.5 pg/mL (ref 0–450)

## 2012-12-18 MED ORDER — NITROGLYCERIN 2 % TD OINT
1.0000 [in_us] | TOPICAL_OINTMENT | Freq: Once | TRANSDERMAL | Status: AC
  Administered 2012-12-18: 1 [in_us] via TOPICAL
  Filled 2012-12-18: qty 30

## 2012-12-18 MED ORDER — ASPIRIN 81 MG PO CHEW
324.0000 mg | CHEWABLE_TABLET | Freq: Once | ORAL | Status: AC
  Administered 2012-12-18: 324 mg via ORAL
  Filled 2012-12-18: qty 4

## 2012-12-18 MED ORDER — MORPHINE SULFATE 4 MG/ML IJ SOLN
4.0000 mg | Freq: Once | INTRAMUSCULAR | Status: AC
  Administered 2012-12-18: 4 mg via INTRAVENOUS
  Filled 2012-12-18: qty 1

## 2012-12-18 MED ORDER — ONDANSETRON HCL 4 MG/2ML IJ SOLN
4.0000 mg | Freq: Once | INTRAMUSCULAR | Status: AC
  Administered 2012-12-18: 4 mg via INTRAVENOUS
  Filled 2012-12-18: qty 2

## 2012-12-18 MED ORDER — SODIUM CHLORIDE 0.9 % IV BOLUS (SEPSIS)
500.0000 mL | Freq: Once | INTRAVENOUS | Status: AC
  Administered 2012-12-18: 500 mL via INTRAVENOUS

## 2012-12-18 NOTE — ED Provider Notes (Signed)
CSN: [id number]     Arrival date & time 12/18/12  2250 History   First MD Initiated Contact with Patient 12/18/12 2257     Chief Complaint  Patient presents with  . Chest Pain  . Emesis   (Consider location/radiation/quality/duration/timing/severity/associated sxs/prior Treatment) HPI Patient reports about 3:45 PM today she started getting the central constant achy chest pain that does not radiate. She also reports about the same time she started coughing up yellow mucus. She denies any known fever. She has had nausea with vomiting x3. She denies diarrhea. Her family describe diaphoresis. She states coughing makes the pain hurt more. Nothing makes it feel better. She denies any swelling or pain in her legs or her abdomen. She states she had chest pain about 2 years ago that she thinks was different from today. She was given nitroglycerin at that time which she has not used. She denies any history of hypertension or diabetes. She does reports being started on [facility] after evaluation by pulmonologist about a month ago.  Patient states her brother had bypass surgery at age 19, he is currently 77 years old.  PCP Dr Harrington Challenger  Past Medical History  Diagnosis Date  . Arthritis   . Headache(784.0)   . Cancer   . Anxiety   . Depression   . Asthma   . CAP (community acquired pneumonia) 12/19/2012   Past Surgical History  Procedure Laterality Date  . Leg surgery     No family history on file. History  Substance Use Topics  . Smoking status: Never Smoker   . Smokeless tobacco: Never Used  . Alcohol Use: No     Comment: occasion   Lives at home Lives with spouse   OB History   Grav Para Term Preterm Abortions TAB SAB Ect Mult Living                 Review of Systems  All other systems reviewed and are negative.    Allergies  Review of patient's allergies indicates no known allergies.  Home Medications   Current Outpatient Rx  Name  Route  Sig  Dispense  Refill  . aspirin 81  MG chewable tablet   Oral   Chew 81 mg by mouth daily.         . Cyanocobalamin (VITAMIN B 12 PO)   Oral   Take 1 tablet by mouth daily.         Marland Kitchen GLUCOSAMINE PO   Oral   Take 1 tablet by mouth daily. Hold while in hospital         . Multiple Vitamin (MULITIVITAMIN WITH MINERALS) TABS   Oral   Take 1 tablet by mouth daily.         . naproxen sodium (ANAPROX) 220 MG tablet   Oral   Take 220 mg by mouth 2 (two) times daily with a meal.         . EXPIRED: nitroGLYCERIN (NITROSTAT) 0.4 MG SL tablet   Sublingual   Place 1 tablet (0.4 mg total) under the tongue every 5 (five) minutes as needed for chest pain.   45 tablet   12   . EXPIRED: pantoprazole (PROTONIX) 40 MG tablet   Oral   Take 1 tablet (40 mg total) by mouth daily.   30 tablet   3   . topiramate (TOPAMAX) 25 MG capsule   Oral   Take 25 mg by mouth 2 (two) times daily.  BP 173/76  Pulse 122  Temp(Src) 100.5 F (38.1 C) (Oral)  Resp 20  SpO2 97%  Vital signs normal except tacycardia, low grade fever  Physical Exam  Nursing note and vitals reviewed. Constitutional: She is oriented to person, place, and time. She appears well-developed and well-nourished.  Non-toxic appearance. She does not appear ill. No distress.  Laying with her eyes closed  HENT:  Head: Normocephalic and atraumatic.  Right Ear: External ear normal.  Left Ear: External ear normal.  Nose: Nose normal. No mucosal edema or rhinorrhea.  Mouth/Throat: Oropharynx is clear and moist and mucous membranes are normal. No dental abscesses or uvula swelling.  Eyes: Conjunctivae and EOM are normal. Pupils are equal, round, and reactive to light.  Neck: Normal range of motion and full passive range of motion without pain. Neck supple.  Cardiovascular: Normal rate, regular rhythm and normal heart sounds.  Exam reveals no gallop and no friction rub.   No murmur heard. Pulmonary/Chest: Effort normal and breath sounds normal. No  respiratory distress. She has no wheezes. She has no rhonchi. She has no rales. She exhibits no tenderness and no crepitus.  Abdominal: Soft. Normal appearance and bowel sounds are normal. She exhibits no distension. There is no tenderness. There is no rebound and no guarding.  Musculoskeletal: Normal range of motion. She exhibits no edema and no tenderness.  Moves all extremities well.   Neurological: She is alert and oriented to person, place, and time. She has normal strength. No cranial nerve deficit.  Skin: Skin is warm, dry and intact. No rash noted. No erythema. No pallor.  Psychiatric: She has a normal mood and affect. Her speech is normal and behavior is normal. Her mood appears not anxious.    ED Course  Procedures (including critical care time)  Medications  cefTRIAXone (ROCEPHIN) 1 g in dextrose 5 % 50 mL IVPB (not administered)  azithromycin (ZITHROMAX) 500 mg in dextrose 5 % 250 mL IVPB (not administered)  acetaminophen (TYLENOL) tablet 1,000 mg (not administered)  aspirin chewable tablet 324 mg (324 mg Oral Given 12/18/12 2325)  morphine 4 MG/ML injection 4 mg (4 mg Intravenous Given 12/18/12 2323)  ondansetron (ZOFRAN) injection 4 mg (4 mg Intravenous Given 12/18/12 2328)  nitroGLYCERIN (NITROGLYN) 2 % ointment 1 inch (1 inch Topical Given 12/18/12 2332)  sodium chloride 0.9 % bolus 500 mL (500 mLs Intravenous New Bag/Given 12/18/12 2318)    Review of old chart shows she was admitted in May 2013 for chest pain that was evaluated by Dr. Barnie Del cardiology. Patient had a normal nuclear medicine stress test done during that admission that was normal. Her chest pain was felt to be atypical and she was discharged.  Patient and family given results of her tests which is consistent with pneumonia. Her rectal temp was 102.5. Her fever was treated with Tylenol and she was started on a community acquired pneumonia antibiotics (she was last admitted 1 1/2 years ago). Marland Kitchen She was  given Zofran for her nausea and morphine for her pain.   00:43 Dr Clementeen Graham admit to tele, team 8  06/01/2011 Clinical Data: Chest pain.  MYOCARDIAL IMAGING WITH SPECT (REST AND PHARMACOLOGIC-STRESS)  GATED LEFT VENTRICULAR WALL MOTION STUDY  LEFT VENTRICULAR EJECTION FRACTION .  Comparison: Plain film of the chest 05/31/2011.  Findings: There is some diaphragmatic attenuation. No fixed or  reversible perfusion defect is identified. Wall motion is normal.  End-diastolic volume is 75 ml and end-systolic volume of 28 ml  for  an ejection fraction of 62%.  IMPRESSION:  Negative for ischemia. Normal ejection fraction.   Labs Review Results for orders placed during the hospital encounter of 12/18/12  CBC      Result Value Range   WBC 9.5  4.0 - 10.5 K/uL   RBC 4.27  3.87 - 5.11 MIL/uL   Hemoglobin 13.8  12.0 - 15.0 g/dL   HCT 40.5  36.0 - 46.0 %   MCV 94.8  78.0 - 100.0 fL   MCH 32.3  26.0 - 34.0 pg   MCHC 34.1  30.0 - 36.0 g/dL   RDW 13.5  11.5 - 15.5 %   Platelets 189  150 - 400 K/uL  BASIC METABOLIC PANEL      Result Value Range   Sodium 136  135 - 145 mEq/L   Potassium 4.2  3.5 - 5.1 mEq/L   Chloride 103  96 - 112 mEq/L   CO2 22  19 - 32 mEq/L   Glucose, Bld 125 (*) 70 - 99 mg/dL   BUN 22  6 - 23 mg/dL   Creatinine, Ser 0.92  0.50 - 1.10 mg/dL   Calcium 9.5  8.4 - 10.5 mg/dL   GFR calc non Af Amer 56 (*) >90 mL/min   GFR calc Af Amer 65 (*) >90 mL/min  PRO B NATRIURETIC PEPTIDE      Result Value Range   Pro B Natriuretic peptide (BNP) 173.5  0 - 450 pg/mL  PROTIME-INR      Result Value Range   Prothrombin Time 13.8  11.6 - 15.2 seconds   INR 1.08  0.00 - 1.49  D-DIMER, QUANTITATIVE      Result Value Range   D-Dimer, Quant 1.26 (*) 0.00 - 0.48 ug/mL-FEU  POCT I-STAT TROPONIN I      Result Value Range   Troponin i, poc 0.02  0.00 - 0.08 ng/mL   Comment 3            Laboratory interpretation all normal except elevated Ddimer in face of pneumonia.     Imaging  Review Dg Chest Port 1 View  12/19/2012   CLINICAL DATA:  Chest pain, vomiting  EXAM: PORTABLE CHEST - 1 VIEW  COMPARISON:  05/17/2012  FINDINGS: Prominent cardiac contour. Left heart border obscured by elevated hemidiaphragm. Aortic atherosclerosis and tortuosity. Left effusion not excluded. Interstitial prominence. Mild right lung base opacity. No pneumothorax. Surgical clips left axilla.  IMPRESSION: Prominent cardiomediastinal contours and elevated left hemidiaphragm, grossly similar to prior.  Mild right lung base opacity; atelectasis (favored) versus infiltrate.   Electronically Signed   By: Carlos Levering M.D.   On: 12/19/2012 00:10    EKG Interpretation    Date/Time:  Monday December 18 2012 22:59:34 EST Ventricular Rate:  119 PR Interval:  152 QRS Duration: 136 QT Interval:  347 QTC Calculation: 488 R Axis:   -41 Text Interpretation:  Sinus or ectopic atrial tachycardia Since last tracing May 13,2013 Left bundle branch block Confirmed by Laramie Gelles  MD-I, Athziry Millican ([id number]) on 12/18/2012 11:02:26 PM            MDM  patient presents with at least 7 hours of constant substernal chest pain with negative troponin. Her pain does hurt with coughing and she has been coughing since she got ill today  and has rectal temp of 102.5. Her chest x-ray is suggestive of pneumonia. Patient does have a change in her EKG in that now she does have a new left  bundle branch block since her last EKG a year and half ago. She is being admitted for further rule out however her presentation now seems consistent with pneumonia. She also has been having nausea and vomiting and concern about her going home as an outpatient and been able to tolerate her oral medications.    1. Chest pain, atypical   2. CAP (community acquired pneumonia)   3. Abnormal EKG   4. Nausea and vomiting    Plan admission   Rolland Porter, MD, Alanson Aly, MD 12/19/12 [phone]

## 2012-12-18 NOTE — ED Notes (Signed)
Pt c/o vomiting and chest pain since this afternoon; states both started at same time; c/o shortness of breath

## 2012-12-19 ENCOUNTER — Encounter (HOSPITAL_COMMUNITY): Payer: Self-pay | Admitting: Emergency Medicine

## 2012-12-19 DIAGNOSIS — J189 Pneumonia, unspecified organism: Secondary | ICD-10-CM

## 2012-12-19 DIAGNOSIS — J45909 Unspecified asthma, uncomplicated: Secondary | ICD-10-CM | POA: Diagnosis present

## 2012-12-19 DIAGNOSIS — R0789 Other chest pain: Secondary | ICD-10-CM

## 2012-12-19 DIAGNOSIS — R9431 Abnormal electrocardiogram [ECG] [EKG]: Secondary | ICD-10-CM

## 2012-12-19 DIAGNOSIS — R6510 Systemic inflammatory response syndrome (SIRS) of non-infectious origin without acute organ dysfunction: Secondary | ICD-10-CM

## 2012-12-19 HISTORY — DX: Pneumonia, unspecified organism: J18.9

## 2012-12-19 LAB — INFLUENZA PANEL BY PCR (TYPE A & B)
H1N1 flu by pcr: NOT DETECTED
Influenza B By PCR: NEGATIVE

## 2012-12-19 LAB — TROPONIN I
Troponin I: <0.3 ng/mL (ref <0.30)
Troponin I: <0.3 ng/mL (ref <0.30)
Troponin I: <0.3 ng/mL (ref <0.30)

## 2012-12-19 LAB — HEMOGLOBIN A1C
Hgb A1c MFr Bld: 6.4 % — ABNORMAL HIGH (ref <5.7)
Mean Plasma Glucose: 137 mg/dL — ABNORMAL HIGH (ref <117)

## 2012-12-19 LAB — LEGIONELLA ANTIGEN, URINE: Legionella Antigen, Urine: NEGATIVE

## 2012-12-19 LAB — STREP PNEUMONIAE URINARY ANTIGEN: Strep Pneumo Urinary Antigen: NEGATIVE

## 2012-12-19 MED ORDER — ACETAMINOPHEN 650 MG RE SUPP: 650.0000 mg | Freq: Four times a day (QID) | RECTAL | Status: DC | PRN

## 2012-12-19 MED ORDER — ACETAMINOPHEN 325 MG PO TABS
650.0000 mg | ORAL_TABLET | Freq: Four times a day (QID) | ORAL | Status: DC | PRN
  Administered 2012-12-19: 650 mg via ORAL
  Filled 2012-12-19: qty 2

## 2012-12-19 MED ORDER — SODIUM CHLORIDE 0.9 % IV SOLN: INTRAVENOUS | Status: DC

## 2012-12-19 MED ORDER — ALBUTEROL SULFATE (5 MG/ML) 0.5% IN NEBU: 2.5000 mg | INHALATION_SOLUTION | Freq: Four times a day (QID) | RESPIRATORY_TRACT | Status: DC | PRN

## 2012-12-19 MED ORDER — ADULT MULTIVITAMIN W/MINERALS CH
1.0000 | ORAL_TABLET | Freq: Every day | ORAL | Status: DC
  Administered 2012-12-19 – 2012-12-21 (×3): 1 via ORAL
  Filled 2012-12-19 (×3): qty 1

## 2012-12-19 MED ORDER — GUAIFENESIN-DM 100-10 MG/5ML PO SYRP: 5.0000 mL | ORAL_SOLUTION | ORAL | Status: DC | PRN

## 2012-12-19 MED ORDER — ENOXAPARIN SODIUM 40 MG/0.4ML ~~LOC~~ SOLN
40.0000 mg | SUBCUTANEOUS | Status: DC
  Administered 2012-12-19 – 2012-12-21 (×3): 40 mg via SUBCUTANEOUS
  Filled 2012-12-19 (×3): qty 0.4

## 2012-12-19 MED ORDER — SODIUM CHLORIDE 0.9 % IV SOLN
INTRAVENOUS | Status: DC
  Administered 2012-12-19 – 2012-12-20 (×3): via INTRAVENOUS

## 2012-12-19 MED ORDER — TOPIRAMATE 25 MG PO TABS
25.0000 mg | ORAL_TABLET | Freq: Two times a day (BID) | ORAL | Status: DC
  Administered 2012-12-19 – 2012-12-21 (×6): 25 mg via ORAL
  Filled 2012-12-19 (×7): qty 1

## 2012-12-19 MED ORDER — ONDANSETRON HCL 4 MG/2ML IJ SOLN: 4.0000 mg | Freq: Three times a day (TID) | INTRAMUSCULAR | Status: AC | PRN

## 2012-12-19 MED ORDER — ASPIRIN 81 MG PO CHEW
81.0000 mg | CHEWABLE_TABLET | Freq: Every day | ORAL | Status: DC
Start: 2012-12-19 — End: 2012-12-21
  Administered 2012-12-19 – 2012-12-21 (×3): 81 mg via ORAL
  Filled 2012-12-19 (×3): qty 1

## 2012-12-19 MED ORDER — DEXTROSE 5 % IV SOLN
1.0000 g | INTRAVENOUS | Status: DC
  Administered 2012-12-19: 22:00:00 1 g via INTRAVENOUS
  Filled 2012-12-19: qty 10

## 2012-12-19 MED ORDER — DEXTROSE 5 % IV SOLN
1.0000 g | Freq: Once | INTRAVENOUS | Status: AC
  Administered 2012-12-19: 03:00:00 1 g via INTRAVENOUS
  Filled 2012-12-19: qty 10

## 2012-12-19 MED ORDER — ONDANSETRON HCL 4 MG/2ML IJ SOLN: 4.0000 mg | Freq: Four times a day (QID) | INTRAMUSCULAR | Status: DC | PRN

## 2012-12-19 MED ORDER — NAPROXEN 250 MG PO TABS
250.0000 mg | ORAL_TABLET | Freq: Two times a day (BID) | ORAL | Status: DC
  Administered 2012-12-19 – 2012-12-21 (×5): 250 mg via ORAL
  Filled 2012-12-19 (×7): qty 1

## 2012-12-19 MED ORDER — ACETAMINOPHEN 500 MG PO TABS
1000.0000 mg | ORAL_TABLET | Freq: Once | ORAL | Status: AC
  Administered 2012-12-19: 1000 mg via ORAL
  Filled 2012-12-19: qty 2

## 2012-12-19 MED ORDER — ZOLPIDEM TARTRATE 5 MG PO TABS
5.0000 mg | ORAL_TABLET | Freq: Every evening | ORAL | Status: DC | PRN
  Administered 2012-12-19 – 2012-12-20 (×2): 5 mg via ORAL
  Filled 2012-12-19 (×2): qty 1

## 2012-12-19 MED ORDER — AZITHROMYCIN 500 MG PO TABS
500.0000 mg | ORAL_TABLET | Freq: Every day | ORAL | Status: DC
  Administered 2012-12-19: 500 mg via ORAL
  Filled 2012-12-19 (×2): qty 1

## 2012-12-19 MED ORDER — DEXTROSE 5 % IV SOLN
500.0000 mg | Freq: Once | INTRAVENOUS | Status: AC
  Administered 2012-12-19: 03:00:00 500 mg via INTRAVENOUS
  Filled 2012-12-19: qty 500

## 2012-12-19 MED ORDER — MOMETASONE FURO-FORMOTEROL FUM 100-5 MCG/ACT IN AERO
2.0000 | INHALATION_SPRAY | Freq: Two times a day (BID) | RESPIRATORY_TRACT | Status: DC
  Administered 2012-12-19 – 2012-12-21 (×5): 2 via RESPIRATORY_TRACT
  Filled 2012-12-19 (×2): qty 8.8

## 2012-12-19 MED ORDER — VITAMIN B-12 100 MCG PO TABS
100.0000 ug | ORAL_TABLET | Freq: Every day | ORAL | Status: DC
  Administered 2012-12-20 – 2012-12-21 (×2): 100 ug via ORAL
  Filled 2012-12-19 (×3): qty 1

## 2012-12-19 MED ORDER — NITROGLYCERIN 0.4 MG SL SUBL: 0.4000 mg | SUBLINGUAL_TABLET | SUBLINGUAL | Status: DC | PRN

## 2012-12-19 MED ORDER — ONDANSETRON HCL 4 MG PO TABS: 4.0000 mg | ORAL_TABLET | Freq: Four times a day (QID) | ORAL | Status: DC | PRN

## 2012-12-19 MED ORDER — HYDRALAZINE HCL 25 MG PO TABS
25.0000 mg | ORAL_TABLET | Freq: Four times a day (QID) | ORAL | Status: DC | PRN
  Filled 2012-12-19: qty 1

## 2012-12-19 MED ORDER — OMEGA-3-ACID ETHYL ESTERS 1 G PO CAPS
1.0000 g | ORAL_CAPSULE | Freq: Every day | ORAL | Status: DC
  Administered 2012-12-20 – 2012-12-21 (×2): 1 g via ORAL
  Filled 2012-12-19 (×3): qty 1

## 2012-12-19 MED ORDER — OSELTAMIVIR PHOSPHATE 75 MG PO CAPS
75.0000 mg | ORAL_CAPSULE | Freq: Two times a day (BID) | ORAL | Status: DC
  Administered 2012-12-19 – 2012-12-21 (×6): 75 mg via ORAL
  Filled 2012-12-19 (×7): qty 1

## 2012-12-19 MED ORDER — SODIUM CHLORIDE 0.9 % IJ SOLN
3.0000 mL | Freq: Two times a day (BID) | INTRAMUSCULAR | Status: DC
  Administered 2012-12-21: 10:00:00 3 mL via INTRAVENOUS

## 2012-12-19 NOTE — H&P (Addendum)
[facility] History and Physical  Alexandra [name] [id number] DOB: [birth year] DOA: 12/18/2012  Referring physician: Dr Rolland Porter PCP:  Melinda Crutch, MD   Chief Complaint:  Cough with chest congestion x 1 day Chest pain x 1 day  HPI:  79 year old female wit history of arthritis, asthma, who presented to the ED with symptoms of cough with chest congestion since this afternoon. Patient was in her usual state of health when she started having cough with yellow productive sputum since this afternoon. The cough was persistent and was then associated with anterior chest pain which was like tightness. She also had 2 episodes of nausea and vomiting of food particles. She had subjective fevers and nasal congestion as well. She reports that her grandson who came from [place] for Thanksgiving had URI symptoms. She denies any recent travel or any other sick contacts. Denies any recent illness. She reports getting flu vaccine this season. She denies any palpitations, orthopnea, PND, chills, headache, blurred vision, dizziness, abdominal pain, bowel or urinary symptoms. Denies any leg trauma or swelling.  Course in the ED Patient was found to be febrile to 102.5 PTH she was tachycardic to 120. Blood pressure was stable. O2 sat was stable on room air. CBC was unremarkable. BMET was normal except for glucose of 125. Initial troponin was negative. EKG showed sinus tachycardia with a new left bundle branch block since her last EKG 18 months back. Patient given full dose of aspirin and sublingual nitrate after which her chest pain symptoms subsided. A chest x-ray done was suggestive of right middle lobe infiltrate. D-dimer done was a mildly elevated. Patient given a dose of IV Rocephin and azithromycin and prior hospitalist called to admit patient to telemetry.  Review of Systems:  Constitutional:  fever, chills,  denies diaphoresis, appetite change and fatigue.  HEENT: Reports nasal congestion,  rhinorrhea, has chronic neck pain, Denies photophobia, eye pain, sore throat, sneezing, mouth sores, trouble swallowing,  and tinnitus.   Respiratory:  productive cough with chest tightness,  Denies SOB, DOE,  and wheezing.   Cardiovascular:  Chest pain, denies palpitations and leg swelling.  Gastrointestinal: nausea, vomiting,  denies abdominal pain, diarrhea, constipation, blood in stool and abdominal distention.  Genitourinary: Denies dysuria, urgency, frequency, hematuria, flank pain and difficulty urinating.  Endocrine: Denies hot or cold intolerance,polyuria, polydipsia. Musculoskeletal: Denies myalgias, back pain, joint swelling, arthralgias and gait problem.  Neurological: Denies dizziness, seizures, syncope, weakness, light-headedness, numbness and headaches.     Past Medical History  Diagnosis Date  . Arthritis   . Headache(784.0)   . Cancer   . Anxiety   . Depression   . Asthma   . CAP (community acquired pneumonia) 12/19/2012   Past Surgical History  Procedure Laterality Date  . Leg surgery     Social History:  reports that she has never smoked. She has never used smokeless tobacco. She reports that she does not drink alcohol or use illicit drugs.  No Known Allergies  No family history on file.  Prior to Admission medications   Medication Sig Start Date End Date Taking? Authorizing Provider  aspirin 81 MG chewable tablet Chew 81 mg by mouth daily.   Yes Historical Provider, MD  Cyanocobalamin (VITAMIN B 12 PO) Take 1 tablet by mouth daily.   Yes Historical Provider, MD  GLUCOSAMINE PO Take 1 tablet by mouth daily.    Yes Historical Provider, MD  mometasone-formoterol (DULERA) 100-5 MCG/ACT AERO Inhale 2 puffs into the lungs 2 (two) times  daily.   Yes Historical Provider, MD  Multiple Vitamin (MULITIVITAMIN WITH MINERALS) TABS Take 1 tablet by mouth daily.   Yes Historical Provider, MD  naproxen sodium (ANAPROX) 220 MG tablet Take 220 mg by mouth 2 (two) times daily  with a meal.   Yes Historical Provider, MD  nitroGLYCERIN (NITROSTAT) 0.4 MG SL tablet Place 0.4 mg under the tongue every 5 (five) minutes as needed for chest pain.   Yes Historical Provider, MD  omega-3 acid ethyl esters (LOVAZA) 1 G capsule Take 1 g by mouth daily.   Yes Historical Provider, MD  topiramate (TOPAMAX) 25 MG capsule Take 25 mg by mouth 2 (two) times daily.   Yes Historical Provider, MD    Physical Exam:  Filed Vitals:   12/18/12 2258 12/18/12 2338 12/19/12 0021  BP: 173/76 143/72 131/59  Pulse: 122 115 115  Temp: 100.5 F (38.1 C) 100.2 F (37.9 C) 102.5 F (39.2 C)  TempSrc: Oral Oral Rectal  Resp: 20  18  SpO2: 97% 94% 96%    Constitutional: Vital signs reviewed.  Elderly female lying in bed in no acute distress HEENT: No pallor, no icterus, nasal congestion noted, no sinus tenderness, moist oral mucosa, no cervical lymphadenopathy Chest: Coarse crackles over her left lung base and left infra-axillary area, no rhonchi or wheezing Cardiovascular: S1 and S2 tachycardic, no murmurs rub or gallop Abdominal: Soft. Non-tender, non-distended, bowel sounds are normal, no masses, organomegaly, or guarding present.  Musculoskeletal: finger joint deformities, Ext:  warm, no edema  Neurological: A&O x3, nonfocal   Labs on Admission:  Basic Metabolic Panel:  Recent Labs Lab 12/18/12 2300  NA 136  K 4.2  CL 103  CO2 22  GLUCOSE 125*  BUN 22  CREATININE 0.92  CALCIUM 9.5   Liver Function Tests: No results found for this basename: AST, ALT, ALKPHOS, BILITOT, PROT, ALBUMIN,  in the last 168 hours No results found for this basename: LIPASE, AMYLASE,  in the last 168 hours No results found for this basename: AMMONIA,  in the last 168 hours CBC:  Recent Labs Lab 12/18/12 2300  WBC 9.5  HGB 13.8  HCT 40.5  MCV 94.8  PLT 189   Cardiac Enzymes: No results found for this basename: CKTOTAL, CKMB, CKMBINDEX, TROPONINI,  in the last 168 hours BNP: No components  found with this basename: POCBNP,  CBG: No results found for this basename: GLUCAP,  in the last 168 hours  Radiological Exams on Admission: Dg Chest Port 1 View  12/19/2012   CLINICAL DATA:  Chest pain, vomiting  EXAM: PORTABLE CHEST - 1 VIEW  COMPARISON:  05/17/2012  FINDINGS: Prominent cardiac contour. Left heart border obscured by elevated hemidiaphragm. Aortic atherosclerosis and tortuosity. Left effusion not excluded. Interstitial prominence. Mild right lung base opacity. No pneumothorax. Surgical clips left axilla.  IMPRESSION: Prominent cardiomediastinal contours and elevated left hemidiaphragm, grossly similar to prior.  Mild right lung base opacity; atelectasis (favored) versus infiltrate.   Electronically Signed   By: Carlos Levering M.D.   On: 12/19/2012 00:10    EKG:  sinus tachycardia at 120, left anterior fascicular block   Assessment/Plan  Principal Problem: SIRS secondary to community-acquired pneumonia -. Admit to telemetry. Patient given IV Rocephin and azithromycin in the ED present with continual. Continue IV hydration with normal saline the patient supportive care with Tylenol, antitussive, antiemetics for nausea and vomiting and albuterol nebs as needed. -Follow blood culture, urine for strep antigen and Legionella. -Given acute onset of  symptoms and history of sick contact will rule out for influenza. Check flu PCR . I will start her on Tamiflu.  Active Problems:   Chest pain at rest Monitor on telemetry. - I think her chest pain symptoms appears more pleuritic than cardiac with symptoms of chest congestion althought she does have new EKG changes. Rule out for ACS with serial cardiac enzymes and EKG. Patient did have mildly elevated d-dimer but likelihood of PE is low. patient given full dose of aspirin and sublingual nitrate in the ED. I will keep her on baby aspirin and when necessary sublingual nitrate. A stress test done in May 2013 was negative for ischemia. She  was seen by [facility] cardiology during previous hospitalization and can be consulted if needed.  ? HTN Patient reports taking medication for HTN ? Hctz. Will place on prn hydralazine for now.    Asthma continue dulera inhalerinhaler. Will order when necessary albuterol nebs  Diet: Cardiac DVT prophylaxis: Subcutaneous Lovenox  Code Status:Full code  Family Communication: none at bedside  Disposition Plan: home Once improved   Louellen Molder [facility] Pager [phone]  If 7PM-7AM, please contact night-coverage [email or web address] Password [email or web address] 12/19/2012, 1:11 AM   Time spent on admission: 70 minutes

## 2012-12-19 NOTE — Progress Notes (Addendum)
Pt seen and examined at bedside. Hemodynamically stable. Admitted with shortness of breath and CXR with mild right lung base opacity; atelectasis versus infiltrate. Strep penumo and legionella negative. Influenza panel positive for Influenza A. Pt started on Tamiflu. CBC and BMP unremarkable. Continue supportive care. Please see details admission H&P by Dr. Clementeen Graham.  Faye Ramsay, MD  [facility] Pager [phone]  If 7PM-7AM, please contact night-coverage [email or web address] Password [email or web address]

## 2012-12-19 NOTE — Care Management Note (Addendum)
    Page 1 of 1   12/21/2012     1:37:02 PM   CARE MANAGEMENT NOTE 12/21/2012  Patient:  Alexandra [name], Alexandra [name]   Account Number:  [id number]  Date Initiated:  12/19/2012  Documentation initiated by:  [facility]  Subjective/Objective Assessment:   79 Y/O F ADMITTED W/SOB.PNA.     Action/Plan:   FROM HOME.HAS PCP,PHARMACY.   Anticipated DC Date:  12/21/2012   Anticipated DC Plan:  [place]  CM consult      Choice offered to / List presented to:             Status of service:  Completed, signed off Medicare Important Message given?   (If response is "NO", the following Medicare IM given date fields will be blank) Date Medicare IM given:   Date Additional Medicare IM given:    Discharge Disposition:  HOME/SELF CARE  Per UR Regulation:  Reviewed for med. necessity/level of care/duration of stay  If discussed at [place] of Stay Meetings, dates discussed:    Comments:  12/21/12 Bedelia Pong RN,BSN NCM [phone] D/C HOME NO [place].  12/19/12 Shaylee Stanislawski RN,BSN NCM [phone]

## 2012-12-19 NOTE — Progress Notes (Signed)
Report called to floor

## 2012-12-20 DIAGNOSIS — R112 Nausea with vomiting, unspecified: Secondary | ICD-10-CM

## 2012-12-20 DIAGNOSIS — J1100 Influenza due to unidentified influenza virus with unspecified type of pneumonia: Secondary | ICD-10-CM

## 2012-12-20 DIAGNOSIS — K5900 Constipation, unspecified: Secondary | ICD-10-CM

## 2012-12-20 LAB — BASIC METABOLIC PANEL
BUN: 16 mg/dL (ref 6–23)
Creatinine, Ser: 1.03 mg/dL (ref 0.50–1.10)
GFR calc Af Amer: 57 mL/min — ABNORMAL LOW (ref >90)
GFR calc non Af Amer: 49 mL/min — ABNORMAL LOW (ref >90)
Potassium: 3.8 mEq/L (ref 3.5–5.1)

## 2012-12-20 LAB — CBC
HCT: 35.8 % — ABNORMAL LOW (ref 36.0–46.0)
MCH: 31.6 pg (ref 26.0–34.0)
MCHC: 32.4 g/dL (ref 30.0–36.0)
RBC: 3.67 MIL/uL — ABNORMAL LOW (ref 3.87–5.11)
RDW: 13.9 % (ref 11.5–15.5)

## 2012-12-20 MED ORDER — SENNA 8.6 MG PO TABS
2.0000 | ORAL_TABLET | Freq: Every day | ORAL | Status: DC
  Administered 2012-12-20: 8.6 mg via ORAL
  Filled 2012-12-20: qty 2

## 2012-12-20 MED ORDER — DOCUSATE SODIUM 100 MG PO CAPS
100.0000 mg | ORAL_CAPSULE | Freq: Two times a day (BID) | ORAL | Status: DC
  Administered 2012-12-20 – 2012-12-21 (×2): 100 mg via ORAL
  Filled 2012-12-20 (×3): qty 1

## 2012-12-20 MED ORDER — ALBUTEROL SULFATE (5 MG/ML) 0.5% IN NEBU
2.5000 mg | INHALATION_SOLUTION | RESPIRATORY_TRACT | Status: DC
  Administered 2012-12-20 – 2012-12-21 (×6): 2.5 mg via RESPIRATORY_TRACT
  Filled 2012-12-20 (×6): qty 0.5

## 2012-12-20 MED ORDER — POLYETHYLENE GLYCOL 3350 17 G PO PACK
17.0000 g | PACK | Freq: Every day | ORAL | Status: DC | PRN
  Filled 2012-12-20: qty 1

## 2012-12-20 MED ORDER — IPRATROPIUM BROMIDE 0.02 % IN SOLN
0.5000 mg | RESPIRATORY_TRACT | Status: DC
  Administered 2012-12-20 – 2012-12-21 (×6): 0.5 mg via RESPIRATORY_TRACT
  Filled 2012-12-20 (×6): qty 2.5

## 2012-12-20 NOTE — Progress Notes (Signed)
[facility] PROGRESS NOTE  Alexandra [name] [id number] DOB: 11-16-1927 DOA: 12/18/2012 PCP:  Melinda Crutch, MD  Assessment/Plan  Pleuritic chest pain, resolved.  Troponins negative.  ECG demonstrated LBBB during tachycardia that resolved after HR trended down.  Discussed case with Dr. Marlou Porch from Cardiology who stated that some people can have tachycardia induced benign LBBB.   -  Start telemetry and monitor for association of LBBB and chest pain which would be particularly concerning if occuring when patient not tachycardic -  Continue ASA and prn NTG -  Will need f/u with cardiology  Pneumonia due to influenza  -  Continue tamiflu -  D/c antibiotics -  Weaned to room air this morning and still SOB with walking to BR  Asthma with mild wheeze and without acute exacerbation -  Start scheduled duonebs to prevent exacerbation -  Continue dulera  Constipation -  Start colace, senna, and prn miralax  Diet:  regular Access:  PIV IVF:  KVO Proph:  lovenox  Code Status: full Family Communication: patient alone Disposition Plan: likely home tomorrow if SOB improving and telemetry benign   Consultants:  Cardiology, Dr. Marlou Porch  Procedures:  CXR  Antibiotics:  Ceftriaxone 12/2  Azithromycin 12/2    HPI/Subjective:  Patient states she feels SOB when getting up to bathroom.  Chest tightness has completely resolved.    Objective: Filed Vitals:   12/20/12 0848 12/20/12 1230 12/20/12 1501 12/20/12 1557  BP: 139/59  142/52   Pulse: 69  73   Temp: 97.9 F (36.6 C)  98.2 F (36.8 C)   TempSrc: Oral  Oral   Resp: 20  20   Height:      Weight:      SpO2: 100% 99% 100% 98%    Intake/Output Summary (Last 24 hours) at 12/20/12 1622 Last data filed at 12/20/12 1500  Gross per 24 hour  Intake   2250 ml  Output   2210 ml  Net     40 ml   Filed Weights   12/19/12 0156 12/20/12 0843  Weight: 98.793 kg (217 lb 12.8 oz) 98.8 kg (217 lb 13 oz)     Exam:   General:  CF, No acute distress  HEENT:  NCAT, MMM  Cardiovascular:  RRR, nl S1, S2 no mrg, 2+ pulses, warm extremities  Respiratory:  Rhonchorous bilateral BS with end-expiratory wheeze, no focal rales, no increased WOB  Abdomen:   NABS, soft, NT/ND  MSK:   Normal tone and bulk, no LEE  Neuro:  Grossly intact  Data Reviewed: Basic Metabolic Panel:  Recent Labs Lab 12/18/12 2300 12/20/12 0403  NA 136 138  K 4.2 3.8  CL 103 105  CO2 22 25  GLUCOSE 125* 98  BUN 22 16  CREATININE 0.92 1.03  CALCIUM 9.5 8.4   Liver Function Tests: No results found for this basename: AST, ALT, ALKPHOS, BILITOT, PROT, ALBUMIN,  in the last 168 hours No results found for this basename: LIPASE, AMYLASE,  in the last 168 hours No results found for this basename: AMMONIA,  in the last 168 hours CBC:  Recent Labs Lab 12/18/12 2300 12/20/12 0403  WBC 9.5 5.1  HGB 13.8 11.6*  HCT 40.5 35.8*  MCV 94.8 97.5  PLT 189 151   Cardiac Enzymes:  Recent Labs Lab 12/19/12 0450 12/19/12 0951 12/19/12 1605  TROPONINI <0.30 <0.30 <0.30   BNP (last 3 results)  Recent Labs  12/18/12 2300  PROBNP 173.5   CBG: No results found  for this basename: GLUCAP,  in the last 168 hours  Recent Results (from the past 240 hour(s))  CULTURE, BLOOD (ROUTINE X 2)     Status: None   Collection Time    12/19/12 12:01 AM      Result Value Range Status   Specimen Description BLOOD RIGHT ANTECUBITAL   Final   Special Requests BOTTLES DRAWN AEROBIC AND ANAEROBIC  4ML   Final   Culture  Setup Time     Final   Value: 12/19/2012 04:33     Performed at [facility]   Culture     Final   Value:        BLOOD CULTURE RECEIVED NO GROWTH TO DATE CULTURE WILL BE HELD FOR 5 DAYS BEFORE ISSUING A FINAL NEGATIVE REPORT     Performed at [facility]   Report Status PENDING   Incomplete  CULTURE, BLOOD (ROUTINE X 2)     Status: None   Collection Time    12/19/12 12:01 AM      Result  Value Range Status   Specimen Description BLOOD RIGHT HAND   Final   Special Requests BOTTLES DRAWN AEROBIC AND ANAEROBIC  5ML   Final   Culture  Setup Time     Final   Value: 12/19/2012 04:24     Performed at [facility]   Culture     Final   Value:        BLOOD CULTURE RECEIVED NO GROWTH TO DATE CULTURE WILL BE HELD FOR 5 DAYS BEFORE ISSUING A FINAL NEGATIVE REPORT     Performed at [facility]   Report Status PENDING   Incomplete     Studies: Dg Chest Port 1 View  12/19/2012   CLINICAL DATA:  Chest pain, vomiting  EXAM: PORTABLE CHEST - 1 VIEW  COMPARISON:  05/17/2012  FINDINGS: Prominent cardiac contour. Left heart border obscured by elevated hemidiaphragm. Aortic atherosclerosis and tortuosity. Left effusion not excluded. Interstitial prominence. Mild right lung base opacity. No pneumothorax. Surgical clips left axilla.  IMPRESSION: Prominent cardiomediastinal contours and elevated left hemidiaphragm, grossly similar to prior.  Mild right lung base opacity; atelectasis (favored) versus infiltrate.   Electronically Signed   By: Carlos Levering M.D.   On: 12/19/2012 00:10    Scheduled Meds: . ipratropium  0.5 mg Nebulization Q4H   And  . albuterol  2.5 mg Nebulization Q4H  . aspirin  81 mg Oral Daily  . enoxaparin (LOVENOX) injection  40 mg Subcutaneous Q24H  . mometasone-formoterol  2 puff Inhalation BID  . multivitamin with minerals  1 tablet Oral Daily  . naproxen  250 mg Oral BID WC  . omega-3 acid ethyl esters  1 g Oral Daily  . oseltamivir  75 mg Oral BID  . sodium chloride  3 mL Intravenous Q12H  . topiramate  25 mg Oral BID  . vitamin B-12  100 mcg Oral Daily   Continuous Infusions: . sodium chloride 75 mL/hr at 12/19/12 1652    Principal Problem:   CAP (community acquired pneumonia) Active Problems:   Neck pain on left side   Chest pain at rest   SIRS (systemic inflammatory response syndrome)   Asthma    Time spent: 30 min    Alexandra [name],  [facility]  [facility] Pager [phone]. If 7PM-7AM, please contact night-coverage at [email or web address], password [email or web address] 12/20/2012, 4:22 PM  LOS: 2 days

## 2012-12-20 NOTE — Progress Notes (Signed)
Consult. Patient's daughter also in room at time of visit. Patient told me she is Catholic and goes to [place]. She requested Catholic visitation. I will call the church and put her name on the list. Presence; listening; prayer.

## 2012-12-21 ENCOUNTER — Encounter (HOSPITAL_COMMUNITY): Payer: Self-pay | Admitting: Internal Medicine

## 2012-12-21 DIAGNOSIS — J45909 Unspecified asthma, uncomplicated: Secondary | ICD-10-CM

## 2012-12-21 DIAGNOSIS — I447 Left bundle-branch block, unspecified: Secondary | ICD-10-CM

## 2012-12-21 HISTORY — DX: Left bundle-branch block, unspecified: I44.7

## 2012-12-21 MED ORDER — OSELTAMIVIR PHOSPHATE 75 MG PO CAPS: 75.0000 mg | ORAL_CAPSULE | Freq: Two times a day (BID) | ORAL | Status: DC

## 2012-12-21 MED ORDER — IPRATROPIUM-ALBUTEROL 20-100 MCG/ACT IN AERS: 1.0000 | INHALATION_SPRAY | Freq: Four times a day (QID) | RESPIRATORY_TRACT | Status: DC

## 2012-12-21 MED ORDER — POLYETHYLENE GLYCOL 3350 17 G PO PACK: 17.0000 g | PACK | Freq: Every day | ORAL | Status: DC

## 2012-12-21 MED ORDER — DSS 100 MG PO CAPS: 100.0000 mg | ORAL_CAPSULE | Freq: Two times a day (BID) | ORAL | Status: DC

## 2012-12-21 NOTE — Discharge Summary (Addendum)
Physician Discharge Summary  Alexandra [name] [id number] DOB: December 12, 1927 DOA: 12/18/2012  PCP:  Melinda Crutch, MD  Admit date: 12/18/2012 Discharge date: 12/21/2012  Recommendations for Outpatient Follow-up:  1. Follow up with cardiology within 1 month for new onset intermittent LBBB.   2. Complete treatment for influenza and then have repeat BP check by PCP.    Discharge Diagnoses:  Principal Problem:   Influenza with pneumonia Active Problems:   Chest pain at rest   SIRS (systemic inflammatory response syndrome)   Asthma   Unspecified constipation   Elevated blood pressure   Intermittent LBBB (left bundle branch block)   Discharge Condition: Stable, improved  Diet recommendation:  Low salt diet  Wt Readings from Last 3 Encounters:  12/20/12 98.8 kg (217 lb 13 oz)  06/01/11 95.6 kg (210 lb 12.2 oz)    History of present illness:  79 year old female wit history of arthritis, asthma, who presented to the ED with symptoms of cough with chest congestion since this afternoon. Patient was in her usual state of health when she started having cough with yellow productive sputum since this afternoon. The cough was persistent and was then associated with anterior chest pain which was like tightness. She also had 2 episodes of nausea and vomiting of food particles. She had subjective fevers and nasal congestion as well. She reports that her grandson who came from [place] for Thanksgiving had URI symptoms. She denies any recent travel or any other sick contacts. Denies any recent illness. She reports getting flu vaccine this season. She denies any palpitations, orthopnea, PND, chills, headache, blurred vision, dizziness, abdominal pain, bowel or urinary symptoms. Denies any leg trauma or swelling.  Course in the ED  Patient was found to be febrile to 102.5 PTH she was tachycardic to 120. Blood pressure was stable. O2 sat was stable on room air. CBC was unremarkable. BMET was normal except for  glucose of 125.  Initial troponin was negative. EKG showed sinus tachycardia with a new left bundle branch block since her last EKG 18 months back.  Patient given full dose of aspirin and sublingual nitrate after which her chest pain symptoms subsided. A chest x-ray done was suggestive of right middle lobe infiltrate. D-dimer done was a mildly elevated. Patient given a dose of IV Rocephin and azithromycin and prior hospitalist called to admit patient to telemetry.   Hospital Course:   Pleuritic chest pain with new onset LBBB:  The patient's pain was primarily associated with cough, however her initial EKG demonstrated left bundle branch block which was new. At the time she was tachycardic. Her troponins were negative. The case was discussed with Doctor Marlou Porch from cardiology who stated that some people can have tachycardia-induced benign left bundle branch block. The patient was placed on telemetry and was noticed to have left bundle branch block the vein with normal heart rate and despite improvement in her breathing with treatment of her flu, however, she remained asymptomatic.  She had a negative stress test one year ago. She continued aspirin and as needed nitroglycerin.  She will have close followup with cardiology in one month.  Sepsis due to influenza pneumonia. She was initially started on ceftriaxone and azithromycin for community-acquired pneumonia, however her influenza A. PCR became positive. She was started on Tamiflu and her antibiotics were discontinued.  Her fever resolved and her WBC count trended down.  She was weaned to room air on the second day of admission and was able to ambulate around her  room and go to the bathroom without severe dyspnea by the seat of discharge.  Asthma with mild wheeze and without acute exacerbation. She continued on inhaled corticosteroids and long-acting beta agonist and was placed on scheduled duo nebs.  She should continue her Dulera and I gave her a  prescription for combivent to use also.    Constipation, she was started on Colace, senna, and as needed MiraLax. She should continue using stool softeners and laxatives at home until she has a bowel movement.  Elevated blood pressure:  May be due to acute illness and will trend down after she has recovered from the flu.  She should have a blood pressure check in one to two weeks by her PCP.    Consultants:  Cardiology by phone, Dr. Marlou Porch Procedures:  CXR Antibiotics:  Ceftriaxone 12/2 >> 12/3 Azithromycin 12/2 >> 12/3 Tamiflu 12/2 >>   Discharge Exam: Filed Vitals:   12/21/12 0408  BP: 154/61  Pulse: 83  Temp: 97.8 F (36.6 C)  Resp: 16   Filed Vitals:   12/20/12 2302 12/20/12 2315 12/21/12 0408 12/21/12 0738  BP: 137/53  154/61   Pulse: 72  83   Temp: 98.4 F (36.9 C)  97.8 F (36.6 C)   TempSrc: Oral  Oral   Resp: 16  16   Height:      Weight:      SpO2: 96% 94% 97% 95%    General: CF, No acute distress  HEENT: NCAT, MMM  Cardiovascular: RRR, nl S1, S2 no mrg, 2+ pulses, warm extremities  Respiratory: Rhonchorous bilateral BS with end-expiratory wheeze, no focal rales, no increased WOB  Abdomen: NABS, soft, NT/ND  MSK: Normal tone and bulk, no LEE  Neuro: Grossly intact   Discharge Instructions      Discharge Orders   Future Orders Complete By Expires   Call MD for:  difficulty breathing, headache or visual disturbances  As directed    Call MD for:  extreme fatigue  As directed    Call MD for:  hives  As directed    Call MD for:  persistant dizziness or light-headedness  As directed    Call MD for:  persistant nausea and vomiting  As directed    Call MD for:  severe uncontrolled pain  As directed    Call MD for:  temperature >100.4  As directed    Diet - low sodium heart healthy  As directed    Discharge instructions  As directed    Comments:     You were hospitalized with chest pain and shortness of breath and were found to have the flu.  You were  started on tamiflu which you should continue for a total of 5 days, your next dose being this evening.  Take all tabs as prescribed until they are gone.  You also had a change on your ECG called Left Bundle Branch Block, which occurred intermittently.  I spoke with Dr. Marlou Porch who would like to see you in clinic in one month.  Please continue your aspirin and use your nitroglycerin as needed.  You had some elevated blood pressures which may be high because of your flu.  Please have your primary care doctor repeat your blood pressure in 2 weeks (after you are better from the flu).  Please use cold and cough medication that states that it is safe for high blood pressure.  Return to the hospital if you become increasingly Arleny Kruger of breath or start having high  fevers again.   Increase activity slowly  As directed        Medication List         aspirin 81 MG chewable tablet  Chew 81 mg by mouth daily.     DSS 100 MG Caps  Take 100 mg by mouth 2 (two) times daily.     GLUCOSAMINE PO  Take 1 tablet by mouth daily.     Ipratropium-Albuterol 20-100 MCG/ACT Aers respimat  Commonly known as:  COMBIVENT  Inhale 1 puff into the lungs every 6 (six) hours.     mometasone-formoterol 100-5 MCG/ACT Aero  Commonly known as:  DULERA  Inhale 2 puffs into the lungs 2 (two) times daily.     multivitamin with minerals Tabs tablet  Take 1 tablet by mouth daily.     naproxen sodium 220 MG tablet  Commonly known as:  ANAPROX  Take 220 mg by mouth 2 (two) times daily with a meal.     nitroGLYCERIN 0.4 MG SL tablet  Commonly known as:  NITROSTAT  Place 0.4 mg under the tongue every 5 (five) minutes as needed for chest pain.     omega-3 acid ethyl esters 1 G capsule  Commonly known as:  LOVAZA  Take 1 g by mouth daily.     oseltamivir 75 MG capsule  Commonly known as:  TAMIFLU  Take 1 capsule (75 mg total) by mouth 2 (two) times daily.     polyethylene glycol packet  Commonly known as:  MIRALAX /  GLYCOLAX  Take 17 g by mouth daily.     topiramate 25 MG capsule  Commonly known as:  TOPAMAX  Take 25 mg by mouth 2 (two) times daily.     VITAMIN B 12 PO  Take 1 tablet by mouth daily.       Follow-up Information   Follow up with  Melinda Crutch, MD. Schedule an appointment as soon as possible for a visit in 2 weeks. (As needed)    Specialty:  Family Medicine   Contact information:   [address] [place] [address] [phone]       Follow up with Candee Furbish, MD. Schedule an appointment as soon as possible for a visit in 1 month.   Specialty:  Cardiology   Contact information:   [id number] [place] [address] [phone]       The results of significant diagnostics from this hospitalization (including imaging, microbiology, ancillary and laboratory) are listed below for reference.    Significant Diagnostic Studies: Dg Chest Port 1 View  12/19/2012   CLINICAL DATA:  Chest pain, vomiting  EXAM: PORTABLE CHEST - 1 VIEW  COMPARISON:  05/17/2012  FINDINGS: Prominent cardiac contour. Left heart border obscured by elevated hemidiaphragm. Aortic atherosclerosis and tortuosity. Left effusion not excluded. Interstitial prominence. Mild right lung base opacity. No pneumothorax. Surgical clips left axilla.  IMPRESSION: Prominent cardiomediastinal contours and elevated left hemidiaphragm, grossly similar to prior.  Mild right lung base opacity; atelectasis (favored) versus infiltrate.   Electronically Signed   By: Carlos Levering M.D.   On: 12/19/2012 00:10    Microbiology: Recent Results (from the past 240 hour(s))  CULTURE, BLOOD (ROUTINE X 2)     Status: None   Collection Time    12/19/12 12:01 AM      Result Value Range Status   Specimen Description BLOOD RIGHT ANTECUBITAL   Final   Special Requests BOTTLES DRAWN AEROBIC AND ANAEROBIC  4ML  Final   Culture  Setup Time     Final   Value: 12/19/2012 04:33     Performed at [facility]    Culture     Final   Value:        BLOOD CULTURE RECEIVED NO GROWTH TO DATE CULTURE WILL BE HELD FOR 5 DAYS BEFORE ISSUING A FINAL NEGATIVE REPORT     Performed at [facility]   Report Status PENDING   Incomplete  CULTURE, BLOOD (ROUTINE X 2)     Status: None   Collection Time    12/19/12 12:01 AM      Result Value Range Status   Specimen Description BLOOD RIGHT HAND   Final   Special Requests BOTTLES DRAWN AEROBIC AND ANAEROBIC  5ML   Final   Culture  Setup Time     Final   Value: 12/19/2012 04:24     Performed at [facility]   Culture     Final   Value:        BLOOD CULTURE RECEIVED NO GROWTH TO DATE CULTURE WILL BE HELD FOR 5 DAYS BEFORE ISSUING A FINAL NEGATIVE REPORT     Performed at [facility]   Report Status PENDING   Incomplete     Labs: Basic Metabolic Panel:  Recent Labs Lab 12/18/12 2300 12/20/12 0403  NA 136 138  K 4.2 3.8  CL 103 105  CO2 22 25  GLUCOSE 125* 98  BUN 22 16  CREATININE 0.92 1.03  CALCIUM 9.5 8.4   Liver Function Tests: No results found for this basename: AST, ALT, ALKPHOS, BILITOT, PROT, ALBUMIN,  in the last 168 hours No results found for this basename: LIPASE, AMYLASE,  in the last 168 hours No results found for this basename: AMMONIA,  in the last 168 hours CBC:  Recent Labs Lab 12/18/12 2300 12/20/12 0403  WBC 9.5 5.1  HGB 13.8 11.6*  HCT 40.5 35.8*  MCV 94.8 97.5  PLT 189 151   Cardiac Enzymes:  Recent Labs Lab 12/19/12 0450 12/19/12 0951 12/19/12 1605  TROPONINI <0.30 <0.30 <0.30   BNP: BNP (last 3 results)  Recent Labs  12/18/12 2300  PROBNP 173.5   CBG: No results found for this basename: GLUCAP,  in the last 168 hours  Time coordinating discharge: 45 minutes  Signed:  Nicolette Gieske  [facility] 12/21/2012, 11:56 AM

## 2012-12-21 NOTE — Progress Notes (Signed)
Around 87am, CCMD notified this RN that pt's heart rhythm flipped into a ventricular rhythm with wide QRS complexes.  HR in the 80's-90's.  Pt sleeping at this time, in NAD.  Upon looking at pt's rhythm on monitor it was noticed that pt jumped back into a sinus rhythm with 1st degree AV heart block.  This happened a few more times.  Pt would only stay in ventricular rhythm for a few minutes at the most, then flip back.  Notified NP on call, K. Baltazar [name] and ordered a 12 lead EKG this a.m. Will continue to monitor pt throughout the night.

## 2012-12-25 LAB — CULTURE, BLOOD (ROUTINE X 2): Culture: NO GROWTH

## 2013-01-29 ENCOUNTER — Encounter: Payer: Medicare Other | Admitting: Cardiology

## 2013-02-13 ENCOUNTER — Encounter: Payer: Self-pay | Admitting: Cardiology

## 2013-02-13 ENCOUNTER — Ambulatory Visit (INDEPENDENT_AMBULATORY_CARE_PROVIDER_SITE_OTHER): Payer: Medicare HMO | Admitting: Cardiology

## 2013-02-13 VITALS — BP 132/84 | HR 91 | Ht 64.0 in | Wt 211.0 lb

## 2013-02-13 DIAGNOSIS — R079 Chest pain, unspecified: Secondary | ICD-10-CM

## 2013-02-13 DIAGNOSIS — E669 Obesity, unspecified: Secondary | ICD-10-CM

## 2013-02-13 DIAGNOSIS — I447 Left bundle-branch block, unspecified: Secondary | ICD-10-CM

## 2013-02-13 NOTE — Progress Notes (Signed)
[place]. [address], [address], [place]  [address] Phone: [phone] Fax:  [phone]  Date:  02/13/2013   ID:  Alexandra [name], DOB [birth year], MRN [id number]  PCP:   Melinda Crutch, MD   History of Present Illness: Alexandra [name] is a 80 y.o. female here for the evaluation of newly discovered left bundle branch block. She was recently discharged from hospital on 12/21/12 after treatment of pneumonia. During hospitalization she was noted to have intermittent left bundle branch block. Initial troponin was negative, EKG showed sinus tachycardia with left bundle branch block which was new. This x-ray showed right middle infiltrate. She was treated with antibiotics. Left bundle branch block was noted during tachycardia episode. She was however noticed to have left bundle branch block with normal heart rate during treatment of the flu. She had a stress test that was negative approximately one year ago.   Wt Readings from Last 3 Encounters:  02/13/13 211 lb (95.709 kg)  12/20/12 217 lb 13 oz (98.8 kg)  06/01/11 210 lb 12.2 oz (95.6 kg)     Past Medical History  Diagnosis Date  . Arthritis   . Headache(784.0)   . Anxiety   . Depression   . Asthma   . CAP (community acquired pneumonia) 12/19/2012  . Cancer     breast cancer  . Intermittent LBBB (left bundle branch block) 12/21/2012  . Obesity   . Peripheral neuropathy   . Sacroiliitis, not elsewhere classified   . Osteopenia   . Hypercholesteremia   . Neuropathy   . Trigger finger     [name]  . Chest pain     NM stress test 05/2011 Normal  . DOE (dyspnea on exertion)     No SOB at rest  . Fatigue     excertional    Past Surgical History  Procedure Laterality Date  . Leg surgery    . Breast lumpectomy      breast cancer  . Lumbar laminectomy    . Achilles tendon surgery    . Tonsillectomy    . Vesicovaginal fistula closure w/ tah      DC hysterectomy  . Polypectomy      Current Outpatient  Prescriptions  Medication Sig Dispense Refill  . aspirin 81 MG chewable tablet Chew 81 mg by mouth daily.      . Cyanocobalamin (VITAMIN B 12 PO) Take 1 tablet by mouth daily.      Marland Kitchen docusate sodium 100 MG CAPS Take 100 mg by mouth 2 (two) times daily.  60 capsule  0  . GLUCOSAMINE PO Take 1 tablet by mouth daily.       . Ipratropium-Albuterol (COMBIVENT) 20-100 MCG/ACT AERS respimat Inhale 1 puff into the lungs every 6 (six) hours.  1 Inhaler  0  . mometasone-formoterol (DULERA) 100-5 MCG/ACT AERO Inhale 2 puffs into the lungs 2 (two) times daily.      . Multiple Vitamin (MULITIVITAMIN WITH MINERALS) TABS Take 1 tablet by mouth daily.      . naproxen sodium (ANAPROX) 220 MG tablet Take 220 mg by mouth 2 (two) times daily with a meal.      . nitroGLYCERIN (NITROSTAT) 0.4 MG SL tablet Place 0.4 mg under the tongue every 5 (five) minutes as needed for chest pain.      Marland Kitchen omega-3 acid ethyl esters (LOVAZA) 1 G capsule Take 1 g by mouth daily.      Marland Kitchen PROAIR HFA  108 (90 BASE) MCG/ACT inhaler       . topiramate (TOPAMAX) 25 MG capsule Take 25 mg by mouth 2 (two) times daily.       No current facility-administered medications for this visit.    Allergies:    Allergies  Allergen Reactions  . Cymbalta [Duloxetine Hcl] Other (See Comments)    Depression  . Lyrica [Pregabalin] Other (See Comments)    Blurry vision    Social History:  The patient  reports that she has never smoked. She has never used smokeless tobacco. She reports that she drinks alcohol. She reports that she does not use illicit drugs.   Family History  Problem Relation Age of Onset  . Pneumonia Mother 71    cause of death  . Diabetes Brother   . Cancer Father     pancreatic  . Alzheimer's disease Sister   . CVA Daughter     01/21/09  . Colon cancer    . Breast cancer      ROS:  Please see the history of present illness.  Denies syncope. Denies any fevers, chills, orthopnea, PND   All other systems reviewed and negative.    PHYSICAL EXAM: VS:  BP 132/84  Pulse 91  Ht 5\' 4"  (1.626 m)  Wt 211 lb (95.709 kg)  BMI 36.20 kg/m2 Well nourished, well developed, in no acute distress HEENT: normal, Bud/AT, EOMI Neck: no JVD, normal carotid upstroke, no bruit Cardiac:  normal S1, S2; RRR; no murmur Lungs:  clear to auscultation bilaterally, no wheezing, rhonchi or rales Abd: soft, nontender, no hepatomegaly, no bruitsobese Ext: no edema, 2+ distal pulses Skin: warm and dry GU: deferred Neuro: no focal abnormalities noted, AAO x 3  EKG:  Sinus rhythm rate 91 with no other abnormalities   Prior hospital records reviewed. Prior stress test, echocardiogram reviewed, lab work reviewed.   ASSESSMENT AND PLAN:  1. Intermittent left bundle-branch block-recent nuclear stress test was low risk, reassuring in 2013, echocardiogram also showed normal pumping function, no wall motion abnormalities. No chest discomfort, no syncope. Shortness of breath is improving after her long bout of influenza/pneumonia. We will continue conservative measures at this point. No further cardiac testing indicated. I explained to them that if she had not had a stress test prior to her discovery of left bundle branch block, we would've likely proceed with one. 2. Obesity-encourage weight loss 3. Previous chest pain-stress test reassuring. 2013. She has not had any further bouts of chest discomfort since that episode. Her shortness of breath is getting better since her pneumonia episode/influenza. 4.  We will see her back as needed.  Signed, Candee Furbish, MD [facility]  02/13/2013 12:28 PM

## 2013-02-13 NOTE — Patient Instructions (Signed)
Your physician recommends that you continue on your current medications as directed. Please refer to the Current Medication list given to you today.  Your physician recommends that you follow-up as needed.  

## 2013-04-27 ENCOUNTER — Emergency Department (HOSPITAL_COMMUNITY): Payer: Medicare HMO

## 2013-04-27 ENCOUNTER — Emergency Department (HOSPITAL_COMMUNITY)
Admission: EM | Admit: 2013-04-27 | Discharge: 2013-04-27 | Disposition: A | Discharge status: Home / Self Care | Payer: Medicare HMO | Attending: Emergency Medicine | Admitting: Emergency Medicine

## 2013-04-27 ENCOUNTER — Encounter (HOSPITAL_COMMUNITY): Payer: Self-pay | Admitting: Emergency Medicine

## 2013-04-27 DIAGNOSIS — J45909 Unspecified asthma, uncomplicated: Secondary | ICD-10-CM | POA: Insufficient documentation

## 2013-04-27 DIAGNOSIS — Z8669 Personal history of other diseases of the nervous system and sense organs: Secondary | ICD-10-CM | POA: Insufficient documentation

## 2013-04-27 DIAGNOSIS — I10 Essential (primary) hypertension: Secondary | ICD-10-CM

## 2013-04-27 DIAGNOSIS — Z79899 Other long term (current) drug therapy: Secondary | ICD-10-CM | POA: Insufficient documentation

## 2013-04-27 DIAGNOSIS — Z8659 Personal history of other mental and behavioral disorders: Secondary | ICD-10-CM | POA: Insufficient documentation

## 2013-04-27 DIAGNOSIS — M129 Arthropathy, unspecified: Secondary | ICD-10-CM | POA: Insufficient documentation

## 2013-04-27 DIAGNOSIS — Z8701 Personal history of pneumonia (recurrent): Secondary | ICD-10-CM | POA: Insufficient documentation

## 2013-04-27 DIAGNOSIS — E669 Obesity, unspecified: Secondary | ICD-10-CM | POA: Insufficient documentation

## 2013-04-27 DIAGNOSIS — Z7982 Long term (current) use of aspirin: Secondary | ICD-10-CM | POA: Insufficient documentation

## 2013-04-27 DIAGNOSIS — R079 Chest pain, unspecified: Secondary | ICD-10-CM | POA: Insufficient documentation

## 2013-04-27 DIAGNOSIS — Z853 Personal history of malignant neoplasm of breast: Secondary | ICD-10-CM | POA: Insufficient documentation

## 2013-04-27 LAB — CBC WITH DIFFERENTIAL/PLATELET
BASOS ABS: 0.1 10*3/uL (ref 0.0–0.1)
BASOS PCT: 1 % (ref 0–1)
EOS PCT: 5 % (ref 0–5)
Eosinophils Absolute: 0.4 10*3/uL (ref 0.0–0.7)
HCT: 41.5 % (ref 36.0–46.0)
Hemoglobin: 14 g/dL (ref 12.0–15.0)
Lymphocytes Relative: 31 % (ref 12–46)
Lymphs Abs: 2.7 10*3/uL (ref 0.7–4.0)
MCH: 33 pg (ref 26.0–34.0)
MCHC: 33.7 g/dL (ref 30.0–36.0)
MCV: 97.9 fL (ref 78.0–100.0)
Monocytes Absolute: 0.5 10*3/uL (ref 0.1–1.0)
Monocytes Relative: 6 % (ref 3–12)
NEUTROS ABS: 5.1 10*3/uL (ref 1.7–7.7)
Neutrophils Relative %: 57 % (ref 43–77)
PLATELETS: 211 10*3/uL (ref 150–400)
RBC: 4.24 MIL/uL (ref 3.87–5.11)
RDW: 13.7 % (ref 11.5–15.5)
WBC: 8.8 10*3/uL (ref 4.0–10.5)

## 2013-04-27 LAB — BASIC METABOLIC PANEL
BUN: 23 mg/dL (ref 6–23)
CO2: 23 mEq/L (ref 19–32)
Calcium: 9.8 mg/dL (ref 8.4–10.5)
Chloride: 108 mEq/L (ref 96–112)
Creatinine, Ser: 0.93 mg/dL (ref 0.50–1.10)
GFR, EST AFRICAN AMERICAN: 64 mL/min — AB (ref >90)
GFR, EST NON AFRICAN AMERICAN: 55 mL/min — AB (ref >90)
Glucose, Bld: 122 mg/dL — ABNORMAL HIGH (ref 70–99)
POTASSIUM: 4 meq/L (ref 3.7–5.3)
Sodium: 145 mEq/L (ref 137–147)

## 2013-04-27 LAB — I-STAT TROPONIN, ED
Troponin i, poc: 0 ng/mL (ref 0.00–0.08)
Troponin i, poc: 0 ng/mL (ref 0.00–0.08)

## 2013-04-27 NOTE — ED Notes (Signed)
Pt and family member given [place] sandwich and Ginger Ale.

## 2013-04-27 NOTE — ED Provider Notes (Signed)
CSN: [id number]     Arrival date & time 04/27/13  0818 History   First MD Initiated Contact with Patient 04/27/13 [phone]     Chief Complaint  Patient presents with  . Chest Pain     (Consider location/radiation/quality/duration/timing/severity/associated sxs/prior Treatment) Patient is a 80 y.o. female presenting with chest pain. The history is provided by the patient.  Chest Pain  Alexandra [name] Is an 80 year old female presents emergency department chief complaint of hypertension and chest pain.  The patient complains of bilateral moderate chest pain.  Pain is nonexertional, not associated with breathing, no associated symptoms such as nausea, diaphoresis.  The patient took nothing for the pain.  She was seen at an urgent care last night and found to be hypertensive.  She states that her blood pressure was 183/160.  She had previous history of hypertension.  She is referred to the emergency department.  Patient has had multiple workups for her chest pain, normal echo 2 years ago and negative stress test in May of 2013 and echocardiogram showed ejection fraction of 50-55% in May of 2013.  Past Medical History  Diagnosis Date  . Arthritis   . Headache(784.0)   . Anxiety   . Depression   . Asthma   . CAP (community acquired pneumonia) 12/19/2012  . Cancer     breast cancer  . Intermittent LBBB (left bundle branch block) 12/21/2012  . Obesity   . Peripheral neuropathy   . Sacroiliitis, not elsewhere classified   . Osteopenia   . Hypercholesteremia   . Neuropathy   . Trigger finger     [name]  . Chest pain     NM stress test 05/2011 Normal  . DOE (dyspnea on exertion)     No SOB at rest  . Fatigue     excertional   Past Surgical History  Procedure Laterality Date  . Leg surgery    . Breast lumpectomy      breast cancer  . Lumbar laminectomy    . Achilles tendon surgery    . Tonsillectomy    . Vesicovaginal fistula closure w/ tah      DC hysterectomy  . Polypectomy      Family History  Problem Relation Age of Onset  . Pneumonia Mother 38    cause of death  . Diabetes Brother   . Cancer Father     pancreatic  . Alzheimer's disease Sister   . CVA Daughter     01/21/09  . Colon cancer    . Breast cancer     History  Substance Use Topics  . Smoking status: Never Smoker   . Smokeless tobacco: Never Used  . Alcohol Use: Yes     Comment: occasion   OB History   Grav Para Term Preterm Abortions TAB SAB Ect Mult Living                 Review of Systems  Cardiovascular: Positive for chest pain.  All other systems reviewed and are negative.     Allergies  Cymbalta and Lyrica  Home Medications   Current Outpatient Rx  Name  Route  Sig  Dispense  Refill  . aspirin 81 MG chewable tablet   Oral   Chew 81 mg by mouth daily.         . Cyanocobalamin (VITAMIN B 12 PO)   Oral   Take 1 tablet by mouth daily.         Marland Kitchen GLUCOSAMINE  PO   Oral   Take 1 tablet by mouth daily.          . Ipratropium-Albuterol (COMBIVENT) 20-100 MCG/ACT AERS respimat   Inhalation   Inhale 1 puff into the lungs every 6 (six) hours.   1 Inhaler   0   . levothyroxine (SYNTHROID, LEVOTHROID) 25 MCG tablet   Oral   Take 25 mcg by mouth daily before breakfast.         . Multiple Vitamin (MULITIVITAMIN WITH MINERALS) TABS   Oral   Take 1 tablet by mouth daily.         . nitroGLYCERIN (NITROSTAT) 0.4 MG SL tablet   Sublingual   Place 0.4 mg under the tongue every 5 (five) minutes as needed for chest pain.         Marland Kitchen omega-3 acid ethyl esters (LOVAZA) 1 G capsule   Oral   Take 1 g by mouth daily.         Marland Kitchen topiramate (TOPAMAX) 25 MG capsule   Oral   Take 25 mg by mouth daily.           BP 147/62  Pulse 94  Temp(Src) 97.8 F (36.6 C) (Oral)  Resp 20  Ht 5\' 4"  (1.626 m)  Wt 211 lb (95.709 kg)  BMI 36.20 kg/m2  SpO2 97% Physical Exam  Constitutional: She is oriented to person, place, and time. She appears well-developed and  well-nourished. No distress.  HENT:  Head: Normocephalic and atraumatic.  Eyes: Conjunctivae are normal. No scleral icterus.  Neck: Normal range of motion.  Cardiovascular: Normal rate, regular rhythm and normal heart sounds.  Exam reveals no gallop and no friction rub.   No murmur heard. Pulmonary/Chest: Effort normal and breath sounds normal. No respiratory distress.  Abdominal: Soft. Bowel sounds are normal. She exhibits no distension and no mass. There is no tenderness. There is no guarding.  Neurological: She is alert and oriented to person, place, and time.  Skin: Skin is warm and dry. She is not diaphoretic.    ED Course  Procedures (including critical care time) Labs Review Labs Reviewed  CBC WITH DIFFERENTIAL  BASIC METABOLIC PANEL  I-STAT [place], ED  Randolm Idol, ED   Imaging Review Dg Chest 2 View  04/27/2013   CLINICAL DATA:  Chest pain and short of breath  EXAM: CHEST  2 VIEW  COMPARISON:  12/18/2012  FINDINGS: Elevation of the left hemidiaphragm is unchanged with left lower lobe atelectasis. Mild right lower lobe atelectasis also noted.  Negative for heart failure or effusion.  Negative for pneumonia.  IMPRESSION: Bibasilar atelectasis without significant interval change. Elevated left hemidiaphragm is chronic.   Electronically Signed   By: Franchot Gallo M.D.   On: 04/27/2013 09:17     EKG Interpretation None      Date: 04/27/2013  Rate: 97  Rhythm: normal sinus rhythm  QRS Axis: normal  Intervals: normal  ST/T Wave abnormalities: normal  Conduction Disutrbances:none  Narrative Interpretation:   Old EKG Reviewed: unchanged   MDM   Final diagnoses:  Chest pain  Hypertension    BP 137/53  Pulse 82  Temp(Src) 97.8 F (36.6 C) (Oral)  Resp 13  Ht 5\' 4"  (1.626 m)  Wt 211 lb (95.709 kg)  BMI 36.20 kg/m2  SpO2 95%  Lungs are otherwise unremarkable, negative chest x-ray Mild hyperglycemia, negative troponin. Will repeat troponin   Filed  Vitals:   04/27/13 1100 04/27/13 1200 04/27/13 1230 04/27/13 1300  BP:  140/54 129/43 136/48 118/39  Pulse: 80 78 80 74  Temp:      TempSrc:      Resp: 16 16 14 16   Height:      Weight:      SpO2: 100% 98% 99% 98%   Patient is hemodynamically stable. Blood pressure trending down without interventions. Patient has had fleeting chest pain which makes cardiac origin  Unlikely.  She has been worked up previously for ACS with negative results. The patient has had 2 negative troponins and onset of anginal equivalent greater than 12 hours ago . I do not suspect PE, dissecting thoracic anuerysm, or any other pulmonary/ cardiac cause of her pain. She is   well appearing., talkative and stable. She may folllo wup with her pcp for concerns about her hypertension..  Patient is to be discharged with recommendation to follow up with PCP in regards to today's hospital visit. Chest pain is not likely of cardiac or pulmonary etiology d/t presentation,  Wells low risk, VSS, no tracheal deviation, no JVD or new murmur, RRR, breath sounds equal bilaterally, EKG without acute abnormalities, negative troponin, and negative CXR. Pt has been advised start a PPI and return to the ED is CP becomes exertional, associated with diaphoresis or nausea, radiates to left jaw/arm, worsens or becomes concerning in any way. Pt appears reliable for follow up and is agreeable to discharge.   Case has been discussed with and seen by Dr. Tawnya Crook who agrees with the above plan to discharge.      Margarita Mail, PA-C 04/30/13 1222

## 2013-04-27 NOTE — Discharge Instructions (Signed)
Your caregiver has diagnosed you as having chest pain that is not specific for one problem, but does not require admission.  You are at low risk for an acute heart condition or other serious illness. Chest pain comes from many different causes.  SEEK IMMEDIATE MEDICAL ATTENTION IF: You have severe chest pain, especially if the pain is crushing or pressure-like and spreads to the arms, back, neck, or jaw, or if you have sweating, nausea (feeling sick to your stomach), or shortness of breath. THIS IS AN EMERGENCY. Don't wait to see if the pain will go away. Get medical help at once. Call 911 or 0 (operator). DO NOT drive yourself to the hospital.  Your chest pain gets worse and does not go away with rest.  You have an attack of chest pain lasting longer than usual, despite rest and treatment with the medications your caregiver has prescribed.  You wake from sleep with chest pain or shortness of breath.  You feel dizzy or faint.  You have chest pain not typical of your usual pain for which you originally saw your caregiver.  PLEASE SEE YOUR PRIMARY CARE DOCTOR AS SOON AS POSSIBLE ABOUT YOUR HIGH BLOOD PRESSURE.   Chest Pain (Nonspecific) It is often hard to give a specific diagnosis for the cause of chest pain. There is always a chance that your pain could be related to something serious, such as a heart attack or a blood clot in the lungs. You need to follow up with your caregiver for further evaluation. CAUSES   Heartburn.  Pneumonia or bronchitis.  Anxiety or stress.  Inflammation around your heart (pericarditis) or lung (pleuritis or pleurisy).  A blood clot in the lung.  A collapsed lung (pneumothorax). It can develop suddenly on its own (spontaneous pneumothorax) or from injury (trauma) to the chest.  Shingles infection (herpes zoster virus). The chest wall is composed of bones, muscles, and cartilage. Any of these can be the source of the pain.  The bones can be bruised by  injury.  The muscles or cartilage can be strained by coughing or overwork.  The cartilage can be affected by inflammation and become sore (costochondritis). DIAGNOSIS  Lab tests or other studies, such as X-rays, electrocardiography, stress testing, or cardiac imaging, may be needed to find the cause of your pain.  TREATMENT   Treatment depends on what may be causing your chest pain. Treatment may include:  Acid blockers for heartburn.  Anti-inflammatory medicine.  Pain medicine for inflammatory conditions.  Antibiotics if an infection is present.  You may be advised to change lifestyle habits. This includes stopping smoking and avoiding alcohol, caffeine, and chocolate.  You may be advised to keep your head raised (elevated) when sleeping. This reduces the chance of acid going backward from your stomach into your esophagus.  Most of the time, nonspecific chest pain will improve within 2 to 3 days with rest and mild pain medicine. HOME CARE INSTRUCTIONS   If antibiotics were prescribed, take your antibiotics as directed. Finish them even if you start to feel better.  For the next few days, avoid physical activities that bring on chest pain. Continue physical activities as directed.  Do not smoke.  Avoid drinking alcohol.  Only take over-the-counter or prescription medicine for pain, discomfort, or fever as directed by your caregiver.  Follow your caregiver's suggestions for further testing if your chest pain does not go away.  Keep any follow-up appointments you made. If you do not go to an  appointment, you could develop lasting (chronic) problems with pain. If there is any problem keeping an appointment, you must call to reschedule. SEEK MEDICAL CARE IF:   You think you are having problems from the medicine you are taking. Read your medicine instructions carefully.  Your chest pain does not go away, even after treatment.  You develop a rash with blisters on your  chest. SEEK IMMEDIATE MEDICAL CARE IF:   You have increased chest pain or pain that spreads to your arm, neck, jaw, back, or abdomen.  You develop shortness of breath, an increasing cough, or you are coughing up blood.  You have severe back or abdominal pain, feel nauseous, or vomit.  You develop severe weakness, fainting, or chills.  You have a fever. THIS IS AN EMERGENCY. Do not wait to see if the pain will go away. Get medical help at once. Call your local emergency services (911 in [place] Do not drive yourself to the hospital. MAKE SURE YOU:   Understand these instructions.  Will watch your condition.  Will get help right away if you are not doing well or get worse. Document Released: 10/14/2004 Document Revised: 03/29/2011 Document Reviewed: 08/10/2007 [facility] Patient Information 2014 [place].   Hypertension As your heart beats, it forces blood through your arteries. This force is your blood pressure. If the pressure is too high, it is called hypertension (HTN) or high blood pressure. HTN is dangerous because you may have it and not know it. High blood pressure may mean that your heart has to work harder to pump blood. Your arteries may be narrow or stiff. The extra work puts you at risk for heart disease, stroke, and other problems.  Blood pressure consists of two numbers, a higher number over a lower, 110/72, for example. It is stated as "110 over 72." The ideal is below 120 for the top number (systolic) and under 80 for the bottom (diastolic). Write down your blood pressure today. You should pay close attention to your blood pressure if you have certain conditions such as:  Heart failure.  Prior heart attack.  Diabetes  Chronic kidney disease.  Prior stroke.  Multiple risk factors for heart disease. To see if you have HTN, your blood pressure should be measured while you are seated with your arm held at the level of the heart. It should be measured at least  twice. A one-time elevated blood pressure reading (especially in the Emergency Department) does not mean that you need treatment. There may be conditions in which the blood pressure is different between your right and left arms. It is important to see your caregiver soon for a recheck. Most people have essential hypertension which means that there is not a specific cause. This type of high blood pressure may be lowered by changing lifestyle factors such as:  Stress.  Smoking.  Lack of exercise.  Excessive weight.  Drug/tobacco/alcohol use.  Eating less salt. Most people do not have symptoms from high blood pressure until it has caused damage to the body. Effective treatment can often prevent, delay or reduce that damage. TREATMENT  When a cause has been identified, treatment for high blood pressure is directed at the cause. There are a large number of medications to treat HTN. These fall into several categories, and your caregiver will help you select the medicines that are best for you. Medications may have side effects. You should review side effects with your caregiver. If your blood pressure stays high after you have  made lifestyle changes or started on medicines,   Your medication(s) may need to be changed.  Other problems may need to be addressed.  Be certain you understand your prescriptions, and know how and when to take your medicine.  Be sure to follow up with your caregiver within the time frame advised (usually within two weeks) to have your blood pressure rechecked and to review your medications.  If you are taking more than one medicine to lower your blood pressure, make sure you know how and at what times they should be taken. Taking two medicines at the same time can result in blood pressure that is too low. SEEK IMMEDIATE MEDICAL CARE IF:  You develop a severe headache, blurred or changing vision, or confusion.  You have unusual weakness or numbness, or a faint  feeling.  You have severe chest or abdominal pain, vomiting, or breathing problems. MAKE SURE YOU:   Understand these instructions.  Will watch your condition.  Will get help right away if you are not doing well or get worse. Document Released: 01/04/2005 Document Revised: 03/29/2011 Document Reviewed: 08/25/2007 [facility] Patient Information 2014 [place].  Hypertension As your heart beats, it forces blood through your arteries. This force is your blood pressure. If the pressure is too high, it is called hypertension (HTN) or high blood pressure. HTN is dangerous because you may have it and not know it. High blood pressure may mean that your heart has to work harder to pump blood. Your arteries may be narrow or stiff. The extra work puts you at risk for heart disease, stroke, and other problems.  Blood pressure consists of two numbers, a higher number over a lower, 110/72, for example. It is stated as "110 over 72." The ideal is below 120 for the top number (systolic) and under 80 for the bottom (diastolic). Write down your blood pressure today. You should pay close attention to your blood pressure if you have certain conditions such as:  Heart failure.  Prior heart attack.  Diabetes  Chronic kidney disease.  Prior stroke.  Multiple risk factors for heart disease. To see if you have HTN, your blood pressure should be measured while you are seated with your arm held at the level of the heart. It should be measured at least twice. A one-time elevated blood pressure reading (especially in the Emergency Department) does not mean that you need treatment. There may be conditions in which the blood pressure is different between your right and left arms. It is important to see your caregiver soon for a recheck. Most people have essential hypertension which means that there is not a specific cause. This type of high blood pressure may be lowered by changing lifestyle factors such  as:  Stress.  Smoking.  Lack of exercise.  Excessive weight.  Drug/tobacco/alcohol use.  Eating less salt. Most people do not have symptoms from high blood pressure until it has caused damage to the body. Effective treatment can often prevent, delay or reduce that damage. TREATMENT  When a cause has been identified, treatment for high blood pressure is directed at the cause. There are a large number of medications to treat HTN. These fall into several categories, and your caregiver will help you select the medicines that are best for you. Medications may have side effects. You should review side effects with your caregiver. If your blood pressure stays high after you have made lifestyle changes or started on medicines,   Your medication(s) may need to  be changed.  Other problems may need to be addressed.  Be certain you understand your prescriptions, and know how and when to take your medicine.  Be sure to follow up with your caregiver within the time frame advised (usually within two weeks) to have your blood pressure rechecked and to review your medications.  If you are taking more than one medicine to lower your blood pressure, make sure you know how and at what times they should be taken. Taking two medicines at the same time can result in blood pressure that is too low. SEEK IMMEDIATE MEDICAL CARE IF:  You develop a severe headache, blurred or changing vision, or confusion.  You have unusual weakness or numbness, or a faint feeling.  You have severe chest or abdominal pain, vomiting, or breathing problems. MAKE SURE YOU:   Understand these instructions.  Will watch your condition.  Will get help right away if you are not doing well or get worse. Document Released: 01/04/2005 Document Revised: 03/29/2011 Document Reviewed: 08/25/2007 [facility] Patient Information 2014 [place].  DASH Diet The DASH diet stands for "Dietary Approaches to Stop Hypertension." It is  a healthy eating plan that has been shown to reduce high blood pressure (hypertension) in as little as 14 days, while also possibly providing other significant health benefits. These other health benefits include reducing the risk of breast cancer after menopause and reducing the risk of type 2 diabetes, heart disease, colon cancer, and stroke. Health benefits also include weight loss and slowing kidney failure in patients with chronic kidney disease.  DIET GUIDELINES  Limit salt (sodium). Your diet should contain less than 1500 mg of sodium daily.  Limit refined or processed carbohydrates. Your diet should include mostly whole grains. Desserts and added sugars should be used sparingly.  Include small amounts of heart-healthy fats. These types of fats include nuts, oils, and tub margarine. Limit saturated and trans fats. These fats have been shown to be harmful in the body. CHOOSING FOODS  The following food groups are based on a 2000 calorie diet. See your Registered Dietitian for individual calorie needs. Grains and Grain Products (6 to 8 servings daily)  Eat More Often: Whole-wheat bread, brown rice, whole-grain or wheat pasta, quinoa, popcorn without added fat or salt (air popped).  Eat Less Often: White bread, white pasta, white rice, cornbread. Vegetables (4 to 5 servings daily)  Eat More Often: Fresh, frozen, and canned vegetables. Vegetables may be raw, steamed, roasted, or grilled with a minimal amount of fat.  Eat Less Often/Avoid: Creamed or fried vegetables. Vegetables in a cheese sauce. Fruit (4 to 5 servings daily)  Eat More Often: All fresh, canned (in natural juice), or frozen fruits. Dried fruits without added sugar. One hundred percent fruit juice ( cup [237 mL] daily).  Eat Less Often: Dried fruits with added sugar. Canned fruit in light or heavy syrup. [employer], Fish, and Poultry (2 servings or less daily. One serving is 3 to 4 oz [85-114 g]).  Eat More Often: Ninety  percent or leaner ground beef, tenderloin, sirloin. Round cuts of beef, chicken breast, [place] breast. All fish. Grill, bake, or broil your meat. Nothing should be fried.  Eat Less Often/Avoid: Fatty cuts of meat, [place], or chicken leg, thigh, or wing. Fried cuts of meat or fish. Dairy (2 to 3 servings)  Eat More Often: Low-fat or fat-free milk, low-fat plain or light yogurt, reduced-fat or part-skim cheese.  Eat Less Often/Avoid: Milk (whole, 2%).Whole milk yogurt. Full-fat  cheeses. Nuts, Seeds, and Legumes (4 to 5 servings per week)  Eat More Often: All without added salt.  Eat Less Often/Avoid: Salted nuts and seeds, canned beans with added salt. Fats and Sweets (limited)  Eat More Often: Vegetable oils, tub margarines without trans fats, sugar-free gelatin. Mayonnaise and salad dressings.  Eat Less Often/Avoid: Coconut oils, palm oils, butter, stick margarine, cream, half and half, cookies, candy, pie. FOR MORE INFORMATION The Dash Diet Eating Plan: www.dashdiet.org Document Released: 12/24/2010 Document Revised: 03/29/2011 Document Reviewed: 12/24/2010 [facility] Patient Information 2014 [place], [place].

## 2013-04-27 NOTE — ED Notes (Signed)
Pt sent from [facility] for further eval of center chest pain, elevated BP and heart rate ongoing for a couple of days. Pt had 81 mg of ASA this morning.

## 2013-04-27 NOTE — ED Notes (Signed)
PA at bedside.

## 2013-04-30 NOTE — ED Provider Notes (Signed)
Medical screening examination/treatment/procedure(s) were conducted as a shared visit with non-physician practitioner(s) and myself.  I personally evaluated the patient during the encounter. Pt presenting w/ recurrent CP. No pain currently. Cardiopulm exam unremarkable. No PE risk factors except for age. Negative delta trop w/o acute ischemic changes of EKG.  She has been worked up previously for ACS with negative results. I believe she can continued outpt w/u with PCP.    EKG Interpretation   Date/Time:  Friday April 27 2013 08:22:04 EDT Ventricular Rate:  97 PR Interval:  184 QRS Duration: 86 QT Interval:  330 QTC Calculation: 419 R Axis:   -9 Text Interpretation:  Normal sinus rhythm Minimal voltage criteria for  LVH, may be normal variant Possible Anterior infarct , age undetermined  Abnormal ECG Confirmed by [place]  MD, [place] [phone]) on 04/27/2013 12:57:46  PM        Neta Ehlers, MD 05/01/13 1054

## 2013-06-14 ENCOUNTER — Other Ambulatory Visit: Payer: Self-pay

## 2013-06-14 DIAGNOSIS — Z1231 Encounter for screening mammogram for malignant neoplasm of breast: Secondary | ICD-10-CM

## 2013-07-18 ENCOUNTER — Ambulatory Visit
Admission: RE | Admit: 2013-07-18 | Discharge: 2013-07-18 | Disposition: A | Discharge status: Home / Self Care | Payer: Medicare HMO | Source: Ambulatory Visit

## 2013-07-18 ENCOUNTER — Encounter (INDEPENDENT_AMBULATORY_CARE_PROVIDER_SITE_OTHER): Payer: Self-pay

## 2013-07-18 DIAGNOSIS — Z1231 Encounter for screening mammogram for malignant neoplasm of breast: Secondary | ICD-10-CM

## 2014-01-24 ENCOUNTER — Ambulatory Visit: Payer: Medicare HMO | Admitting: Sports Medicine

## 2014-02-05 DIAGNOSIS — R05 Cough: Secondary | ICD-10-CM | POA: Diagnosis not present

## 2014-02-19 ENCOUNTER — Emergency Department (HOSPITAL_COMMUNITY): Payer: Medicare HMO

## 2014-02-19 ENCOUNTER — Encounter (HOSPITAL_COMMUNITY): Payer: Self-pay

## 2014-02-19 ENCOUNTER — Observation Stay (HOSPITAL_COMMUNITY)
Admission: EM | Admit: 2014-02-19 | Discharge: 2014-02-20 | Disposition: A | Discharge status: Home / Self Care | Payer: Medicare HMO | Attending: Internal Medicine | Admitting: Internal Medicine

## 2014-02-19 DIAGNOSIS — I447 Left bundle-branch block, unspecified: Secondary | ICD-10-CM | POA: Insufficient documentation

## 2014-02-19 DIAGNOSIS — W19XXXA Unspecified fall, initial encounter: Secondary | ICD-10-CM

## 2014-02-19 DIAGNOSIS — Z853 Personal history of malignant neoplasm of breast: Secondary | ICD-10-CM | POA: Diagnosis not present

## 2014-02-19 DIAGNOSIS — M25552 Pain in left hip: Secondary | ICD-10-CM

## 2014-02-19 DIAGNOSIS — M25559 Pain in unspecified hip: Secondary | ICD-10-CM | POA: Diagnosis present

## 2014-02-19 DIAGNOSIS — G629 Polyneuropathy, unspecified: Secondary | ICD-10-CM | POA: Insufficient documentation

## 2014-02-19 DIAGNOSIS — E780 Pure hypercholesterolemia: Secondary | ICD-10-CM | POA: Insufficient documentation

## 2014-02-19 DIAGNOSIS — F329 Major depressive disorder, single episode, unspecified: Secondary | ICD-10-CM | POA: Insufficient documentation

## 2014-02-19 DIAGNOSIS — S79912A Unspecified injury of left hip, initial encounter: Secondary | ICD-10-CM | POA: Diagnosis not present

## 2014-02-19 DIAGNOSIS — M8580 Other specified disorders of bone density and structure, unspecified site: Secondary | ICD-10-CM | POA: Insufficient documentation

## 2014-02-19 DIAGNOSIS — Z7982 Long term (current) use of aspirin: Secondary | ICD-10-CM | POA: Diagnosis not present

## 2014-02-19 DIAGNOSIS — S3993XA Unspecified injury of pelvis, initial encounter: Secondary | ICD-10-CM | POA: Diagnosis not present

## 2014-02-19 DIAGNOSIS — Z79899 Other long term (current) drug therapy: Secondary | ICD-10-CM | POA: Diagnosis not present

## 2014-02-19 DIAGNOSIS — Z8701 Personal history of pneumonia (recurrent): Secondary | ICD-10-CM | POA: Insufficient documentation

## 2014-02-19 DIAGNOSIS — W1839XA Other fall on same level, initial encounter: Secondary | ICD-10-CM | POA: Insufficient documentation

## 2014-02-19 DIAGNOSIS — E039 Hypothyroidism, unspecified: Secondary | ICD-10-CM

## 2014-02-19 DIAGNOSIS — Y92008 Other place in unspecified non-institutional (private) residence as the place of occurrence of the external cause: Secondary | ICD-10-CM | POA: Diagnosis not present

## 2014-02-19 DIAGNOSIS — Y9301 Activity, walking, marching and hiking: Secondary | ICD-10-CM | POA: Diagnosis not present

## 2014-02-19 DIAGNOSIS — Y998 Other external cause status: Secondary | ICD-10-CM | POA: Diagnosis not present

## 2014-02-19 DIAGNOSIS — M6530 Trigger finger, unspecified finger: Secondary | ICD-10-CM | POA: Diagnosis not present

## 2014-02-19 DIAGNOSIS — E669 Obesity, unspecified: Secondary | ICD-10-CM | POA: Insufficient documentation

## 2014-02-19 DIAGNOSIS — F419 Anxiety disorder, unspecified: Secondary | ICD-10-CM | POA: Diagnosis not present

## 2014-02-19 DIAGNOSIS — M461 Sacroiliitis, not elsewhere classified: Secondary | ICD-10-CM | POA: Diagnosis not present

## 2014-02-19 DIAGNOSIS — J45909 Unspecified asthma, uncomplicated: Secondary | ICD-10-CM | POA: Diagnosis present

## 2014-02-19 DIAGNOSIS — S79911A Unspecified injury of right hip, initial encounter: Secondary | ICD-10-CM | POA: Diagnosis not present

## 2014-02-19 DIAGNOSIS — R030 Elevated blood-pressure reading, without diagnosis of hypertension: Secondary | ICD-10-CM

## 2014-02-19 DIAGNOSIS — M545 Low back pain: Secondary | ICD-10-CM | POA: Diagnosis not present

## 2014-02-19 DIAGNOSIS — M1990 Unspecified osteoarthritis, unspecified site: Secondary | ICD-10-CM | POA: Insufficient documentation

## 2014-02-19 DIAGNOSIS — IMO0001 Reserved for inherently not codable concepts without codable children: Secondary | ICD-10-CM | POA: Diagnosis present

## 2014-02-19 DIAGNOSIS — R52 Pain, unspecified: Secondary | ICD-10-CM | POA: Diagnosis not present

## 2014-02-19 LAB — URINALYSIS, ROUTINE W REFLEX MICROSCOPIC
Bilirubin Urine: NEGATIVE
Glucose, UA: NEGATIVE mg/dL
Hgb urine dipstick: NEGATIVE
Ketones, ur: NEGATIVE mg/dL
Nitrite: NEGATIVE
PH: 5 (ref 5.0–8.0)
PROTEIN: NEGATIVE mg/dL
Specific Gravity, Urine: 1.026 (ref 1.005–1.030)
UROBILINOGEN UA: 0.2 mg/dL (ref 0.0–1.0)

## 2014-02-19 LAB — CBC WITH DIFFERENTIAL/PLATELET
BASOS PCT: 0 % (ref 0–1)
Basophils Absolute: 0 10*3/uL (ref 0.0–0.1)
Eosinophils Absolute: 0 10*3/uL (ref 0.0–0.7)
Eosinophils Relative: 0 % (ref 0–5)
HCT: 41.2 % (ref 36.0–46.0)
Hemoglobin: 13.8 g/dL (ref 12.0–15.0)
LYMPHS ABS: 2.4 10*3/uL (ref 0.7–4.0)
Lymphocytes Relative: 20 % (ref 12–46)
MCH: 32.1 pg (ref 26.0–34.0)
MCHC: 33.5 g/dL (ref 30.0–36.0)
MCV: 95.8 fL (ref 78.0–100.0)
MONO ABS: 0.6 10*3/uL (ref 0.1–1.0)
Monocytes Relative: 5 % (ref 3–12)
NEUTROS ABS: 8.9 10*3/uL — AB (ref 1.7–7.7)
NEUTROS PCT: 75 % (ref 43–77)
Platelets: 186 10*3/uL (ref 150–400)
RBC: 4.3 MIL/uL (ref 3.87–5.11)
RDW: 13 % (ref 11.5–15.5)
WBC: 12 10*3/uL — ABNORMAL HIGH (ref 4.0–10.5)

## 2014-02-19 LAB — URINE MICROSCOPIC-ADD ON

## 2014-02-19 LAB — I-STAT CHEM 8, ED
BUN: 38 mg/dL — AB (ref 6–23)
Calcium, Ion: 1.12 mmol/L — ABNORMAL LOW (ref 1.13–1.30)
Chloride: 106 mmol/L (ref 96–112)
Creatinine, Ser: 0.9 mg/dL (ref 0.50–1.10)
Glucose, Bld: 116 mg/dL — ABNORMAL HIGH (ref 70–99)
HCT: 43 % (ref 36.0–46.0)
HEMOGLOBIN: 14.6 g/dL (ref 12.0–15.0)
POTASSIUM: 4.8 mmol/L (ref 3.5–5.1)
Sodium: 139 mmol/L (ref 135–145)
TCO2: 23 mmol/L (ref 0–100)

## 2014-02-19 MED ORDER — SODIUM CHLORIDE 0.9 % IV SOLN: 250.0000 mL | INTRAVENOUS | Status: DC | PRN

## 2014-02-19 MED ORDER — SODIUM CHLORIDE 0.9 % IJ SOLN
3.0000 mL | Freq: Two times a day (BID) | INTRAMUSCULAR | Status: DC
  Administered 2014-02-19 – 2014-02-20 (×2): 3 mL via INTRAVENOUS

## 2014-02-19 MED ORDER — ALBUTEROL SULFATE (2.5 MG/3ML) 0.083% IN NEBU: 2.5000 mg | INHALATION_SOLUTION | RESPIRATORY_TRACT | Status: DC | PRN

## 2014-02-19 MED ORDER — ONDANSETRON HCL 4 MG/2ML IJ SOLN
4.0000 mg | Freq: Four times a day (QID) | INTRAMUSCULAR | Status: DC | PRN
Start: 2014-02-19 — End: 2014-02-20

## 2014-02-19 MED ORDER — ACETAMINOPHEN 325 MG PO TABS: 650.0000 mg | ORAL_TABLET | Freq: Four times a day (QID) | ORAL | Status: DC | PRN

## 2014-02-19 MED ORDER — ASPIRIN 81 MG PO CHEW
81.0000 mg | CHEWABLE_TABLET | Freq: Every day | ORAL | Status: DC
  Administered 2014-02-19 – 2014-02-20 (×2): 81 mg via ORAL
  Filled 2014-02-19 (×3): qty 1

## 2014-02-19 MED ORDER — SODIUM CHLORIDE 0.9 % IJ SOLN: 3.0000 mL | INTRAMUSCULAR | Status: DC | PRN

## 2014-02-19 MED ORDER — NITROGLYCERIN 0.4 MG SL SUBL: 0.4000 mg | SUBLINGUAL_TABLET | SUBLINGUAL | Status: DC | PRN

## 2014-02-19 MED ORDER — OMEGA-3-ACID ETHYL ESTERS 1 G PO CAPS
1.0000 g | ORAL_CAPSULE | Freq: Every day | ORAL | Status: DC
  Administered 2014-02-20: 1 g via ORAL
  Filled 2014-02-19 (×2): qty 1

## 2014-02-19 MED ORDER — LEVOTHYROXINE SODIUM 25 MCG PO TABS
25.0000 ug | ORAL_TABLET | Freq: Every day | ORAL | Status: DC
  Administered 2014-02-20: 25 ug via ORAL
  Filled 2014-02-19 (×4): qty 1

## 2014-02-19 MED ORDER — HYDROMORPHONE HCL 1 MG/ML IJ SOLN: 1.0000 mg | INTRAMUSCULAR | Status: DC | PRN

## 2014-02-19 MED ORDER — FENTANYL CITRATE 0.05 MG/ML IJ SOLN
50.0000 ug | Freq: Once | INTRAMUSCULAR | Status: AC
  Administered 2014-02-19: 50 ug via INTRAVENOUS
  Filled 2014-02-19: qty 2

## 2014-02-19 MED ORDER — IBUPROFEN 600 MG PO TABS
600.0000 mg | ORAL_TABLET | Freq: Four times a day (QID) | ORAL | Status: DC
  Administered 2014-02-19 – 2014-02-20 (×4): 600 mg via ORAL
  Filled 2014-02-19 (×2): qty 1
  Filled 2014-02-19: qty 3
  Filled 2014-02-19 (×2): qty 1
  Filled 2014-02-19: qty 3
  Filled 2014-02-19 (×3): qty 1

## 2014-02-19 MED ORDER — HYDROCODONE-ACETAMINOPHEN 5-325 MG PO TABS
1.0000 | ORAL_TABLET | ORAL | Status: DC | PRN
  Administered 2014-02-19 (×2): 2 via ORAL
  Filled 2014-02-19 (×2): qty 2

## 2014-02-19 MED ORDER — CYCLOBENZAPRINE HCL 5 MG PO TABS
5.0000 mg | ORAL_TABLET | Freq: Three times a day (TID) | ORAL | Status: DC
  Administered 2014-02-19 – 2014-02-20 (×2): 5 mg via ORAL
  Filled 2014-02-19 (×4): qty 1

## 2014-02-19 MED ORDER — ENOXAPARIN SODIUM 40 MG/0.4ML ~~LOC~~ SOLN
40.0000 mg | SUBCUTANEOUS | Status: DC
  Administered 2014-02-19: 40 mg via SUBCUTANEOUS
  Filled 2014-02-19 (×2): qty 0.4

## 2014-02-19 MED ORDER — VITAMIN D3 25 MCG (1000 UNIT) PO TABS
1000.0000 [IU] | ORAL_TABLET | Freq: Every day | ORAL | Status: DC
  Administered 2014-02-20: 1000 [IU] via ORAL
  Filled 2014-02-19 (×2): qty 1

## 2014-02-19 MED ORDER — HYDROCODONE-ACETAMINOPHEN 5-325 MG PO TABS
1.0000 | ORAL_TABLET | Freq: Once | ORAL | Status: AC
  Administered 2014-02-19: 1 via ORAL
  Filled 2014-02-19: qty 1

## 2014-02-19 MED ORDER — CIPROFLOXACIN HCL 500 MG PO TABS
500.0000 mg | ORAL_TABLET | Freq: Once | ORAL | Status: AC
  Administered 2014-02-19: 500 mg via ORAL
  Filled 2014-02-19: qty 1

## 2014-02-19 MED ORDER — ONDANSETRON HCL 4 MG PO TABS: 4.0000 mg | ORAL_TABLET | Freq: Four times a day (QID) | ORAL | Status: DC | PRN

## 2014-02-19 MED ORDER — ONDANSETRON HCL 4 MG/2ML IJ SOLN
4.0000 mg | Freq: Once | INTRAMUSCULAR | Status: AC
  Administered 2014-02-19: 4 mg via INTRAVENOUS
  Filled 2014-02-19 (×2): qty 2

## 2014-02-19 MED ORDER — ACETAMINOPHEN 650 MG RE SUPP: 650.0000 mg | Freq: Four times a day (QID) | RECTAL | Status: DC | PRN

## 2014-02-19 MED ORDER — ONDANSETRON HCL 4 MG/2ML IJ SOLN
4.0000 mg | Freq: Once | INTRAMUSCULAR | Status: DC
  Filled 2014-02-19: qty 2

## 2014-02-19 NOTE — ED Notes (Signed)
Patient states "i dont want to take the nausea medication right now" "i dont think I need it"

## 2014-02-19 NOTE — ED Notes (Signed)
EDP at bedside  

## 2014-02-19 NOTE — ED Notes (Signed)
Bed: [id number] Expected date:  Expected time:  Means of arrival:  Comments: EMS 81 yo female fall with left hip pain

## 2014-02-19 NOTE — ED Notes (Addendum)
Patient states she was going to the restroom this morning and lost her balance and fell. Patient c/o left hip, left side/back pain, and left posterior head pain. Patient states pain increased and was unable to bear weight on affected extremity. Patient was c/o NV as well and was given 4mg  Zofran via [facility] and 129mcg Fentanyl for pain. Patient  Is A&OX4 at this time.

## 2014-02-19 NOTE — Progress Notes (Signed)
Clinical Social Work Department BRIEF PSYCHOSOCIAL ASSESSMENT 02/19/2014  Patient:  Alexandra [name], Alexandra [name]     Account Number:  [id number]     Admit date:  02/19/2014  Clinical Social Worker:  Jazell Rosenau Inez Catalina  Date/Time:  02/19/2014 12:00 N  Referred by:  CSW  Date Referred:  02/19/2014 Referred for  SNF Placement   Other Referral:   Interview type:  Patient Other interview type:   patient friend    PSYCHOSOCIAL DATA Living Status:  FAMILY Admitted from facility:   Level of care:   Primary support name:  Aasiyah Auerbach Primary support relationship to patient:  SPOUSE Degree of support available:   moderate, however very sick. Pt children are assisting, however pt will get contact information    CURRENT CONCERNS Current Concerns  Post-Acute Placement   Other Concerns:    SOCIAL WORK ASSESSMENT / PLAN CSW met with pt at bedside to complete psychosocial assesment. CSW met with pt to discuss possible needs upon discharge due to pt moore mobility due to hip pain. Patient shared that she lives at home with her husband who she is primary care giver for. pt states that she has 3 daughters in [place] and one son locally. Patient states that upon discharge patient prefers to return home wiht home health and support from her children rather than a skilled nursing facility placement. Patient states that she would be ok with [facility] as her husabnd went preiovusly, however prefers to return home. Pt not interested in looking into skilled nursing facility options at this time but will consider if recommneded by physical therapy.  Pt husband is very sick and pt wife does not wish to leave his side.   Assessment/plan status:  Psychosocial Support/Ongoing Assessment of Needs Other assessment/ plan:   Information/referral to community resources:   none identified at this time as pt declined snf list at this time    PATIENT'S/FAMILY'S RESPONSE TO PLAN OF CARE: Pt thanked csw for concern  and support. Pt states she appreciates the concern but she is hopeful to return home with her husband and rely on care and assistance from her children. Patient states she will be discuss further if necessary. CSW will continue to follow to assess potential needs.       Noreene Larsson [phone]  ED CSW 02/19/2014 2:29 PM

## 2014-02-19 NOTE — Progress Notes (Signed)
  CARE MANAGEMENT ED NOTE 02/19/2014  Patient:  Alexandra [name], Alexandra [name]   Account Number:  [id number]  Date Initiated:  02/19/2014  Documentation initiated by:  Jackelyn Poling  Subjective/Objective Assessment:   81 yr old [place] hmo going to the restroom this morning and lost her balance and fell. Patient c/o left hip, left side/back pain, and left posterior head pain. Patient states pain increased and was unable to bear weight on affected     Subjective/Objective Assessment Detail:   pcp Melinda Crutch   Pt states she thought she would "come in and go home"  Reports [facility]" and her choice is to go with "the one I had before"  Pt reports having [name] cane and walker at home  Cm discussed availability of her obtaining new DME if she has not had DME in the last 2-3 yrs as [name] Psychologist, prison and probation services pt     Action/Plan:   spoke with EDP, Lindy and ED SW about admission and disposition prior to hospitalist consult Pending EDP and Hospitalist consult response for admission Spoke with pt & daughter in law see notes below   Action/Plan Detail:   [id number] [facility] Pt [facility]in in 2003 will follow for d/c needs   Anticipated DC Date:  02/20/2014     Status Recommendation to Physician:   Result of Recommendation:    Other ED Services  Consult Working Plan   In-house referral  Clinical Social Worker   DC Forensic scientist  Other  Outpatient Services - Pt will follow up   [facility]   Choice offered to / List presented to:  C-1 Patient          Status of service:  Completed, signed off  ED Comments:   ED Comments Detail:  CM reviewed in details medicare guidelines, home health ([facility]) (length of stay in home, types of [facility] staff available, coverage, primary caregiver, up to 24 hrs before services may be started),  CM reviewed availability of [facility] SW to assist pcp to get pt to snf (if desired disposition) from the community level.  CM provided family with [name] list of [facility] home health agencies Discussed pt to be further evaluated by unit therapists (PT/OT) for recommendation of level of care and share this with attending MD and unit CM

## 2014-02-19 NOTE — Plan of Care (Signed)
Problem: Phase I Progression Outcomes Goal: OOB as tolerated unless otherwise ordered Outcome: Not Progressing Waiting on PT eval before pt ambulates

## 2014-02-19 NOTE — Progress Notes (Signed)
Choice offered  HOME HEALTH AGENCIES SERVING [place]   Agencies that are Medicare-Certified and are affiliated with [facility]  Telephone Number Address  [facility]   The [facility] has ownership interest in this company; however, you are under no obligation to use this agency. [phone] or  [phone] [address] [place], [place] [address] [email or web address]   Agencies that are Medicare-Certified and are not affiliated with [facility]                                                                                  Home Health Agency Telephone Number Address  [facility] [phone] Fax [phone] [address], [address] [place], [place]  [address] [email or web address]  [facility] [phone] Fax [phone] [address]   [address] [place], [place]  [address] [email or web address]  [facility] [phone] Fax [phone] [address] [place], [place] [address] [email or web address]  [facility] [phone] Fax [phone] [address], [address] [place], [place]  [address] [email or web address]  [facility] [phone] Fax [phone] [address] [place], [place] [address] [email or web address]  [facility] [phone] [address] [place], [place] [address] [email or web address]  [facility] [phone]  [address] [place], [place] [address] [email or web address]  [facility] [phone] or [phone] Fax number [phone] [address] [place], [place]  [address] [email or web address]  [facility] [phone] Fax [phone] [address] [place], [place]  [address]  [facility]   [phone] Fax [phone] [address] [place], [place] [address] [email or web address]   

## 2014-02-19 NOTE — ED Provider Notes (Addendum)
CSN: [id number]     Arrival date & time 02/19/14  0619 History   First MD Initiated Contact with Patient 02/19/14 [phone]     Chief Complaint  Patient presents with  . Fall     (Consider location/radiation/quality/duration/timing/severity/associated sxs/prior Treatment) Patient is a 81 y.o. female presenting with fall. The history is provided by the patient.  Fall This is a new (pt was walking to the bathroom and states lost balance in the bathroom and fell.  ) problem. The current episode started 1 to 2 hours ago. The problem occurs constantly. The problem has been gradually worsening. Pertinent negatives include no chest pain, no abdominal pain, no headaches and no shortness of breath. Associated symptoms comments: Left hip pain.  No LoC and can recall the entire event.  No syncope.  States hit head slightly on the door nob but no headache, neck pain or N/V. The symptoms are aggravated by walking and standing. The symptoms are relieved by rest. She has tried rest for the symptoms. The treatment provided no relief.    Past Medical History  Diagnosis Date  . Arthritis   . Headache(784.0)   . Anxiety   . Depression   . Asthma   . CAP (community acquired pneumonia) 12/19/2012  . Cancer     breast cancer  . Intermittent LBBB (left bundle branch block) 12/21/2012  . Obesity   . Peripheral neuropathy   . Sacroiliitis, not elsewhere classified   . Osteopenia   . Hypercholesteremia   . Neuropathy   . Trigger finger     [name]  . Chest pain     NM stress test 05/2011 Normal  . DOE (dyspnea on exertion)     No SOB at rest  . Fatigue     excertional   Past Surgical History  Procedure Laterality Date  . Leg surgery    . Breast lumpectomy      breast cancer  . Lumbar laminectomy    . Achilles tendon surgery    . Tonsillectomy    . Vesicovaginal fistula closure w/ tah      DC hysterectomy  . Polypectomy     Family History  Problem Relation Age of Onset  . Pneumonia Mother 24     cause of death  . Diabetes Brother   . Cancer Father     pancreatic  . Alzheimer's disease Sister   . CVA Daughter     01/21/09  . Colon cancer    . Breast cancer     History  Substance Use Topics  . Smoking status: Never Smoker   . Smokeless tobacco: Never Used  . Alcohol Use: Yes     Comment: occasion   OB History    No data available     Review of Systems  Respiratory: Negative for shortness of breath.   Cardiovascular: Negative for chest pain.  Gastrointestinal: Negative for abdominal pain.  Neurological: Negative for headaches.  All other systems reviewed and are negative.     Allergies  Cymbalta and Lyrica  Home Medications   Prior to Admission medications   Medication Sig Start Date End Date Taking? Authorizing Provider  aspirin 81 MG chewable tablet Chew 81 mg by mouth daily.    Historical Provider, MD  Cyanocobalamin (VITAMIN B 12 PO) Take 1 tablet by mouth daily.    Historical Provider, MD  GLUCOSAMINE PO Take 1 tablet by mouth daily.     Historical Provider, MD  Ipratropium-Albuterol (COMBIVENT) 20-100 MCG/ACT  AERS respimat Inhale 1 puff into the lungs every 6 (six) hours. 12/21/12   Janece Canterbury, MD  levothyroxine (SYNTHROID, LEVOTHROID) 25 MCG tablet Take 25 mcg by mouth daily before breakfast.    Historical Provider, MD  Multiple Vitamin (MULITIVITAMIN WITH MINERALS) TABS Take 1 tablet by mouth daily.    Historical Provider, MD  nitroGLYCERIN (NITROSTAT) 0.4 MG SL tablet Place 0.4 mg under the tongue every 5 (five) minutes as needed for chest pain.    Historical Provider, MD  omega-3 acid ethyl esters (LOVAZA) 1 G capsule Take 1 g by mouth daily.    Historical Provider, MD  topiramate (TOPAMAX) 25 MG capsule Take 25 mg by mouth daily.     Historical Provider, MD   BP 130/58 mmHg  Pulse 84  Temp(Src) 98.2 F (36.8 C) (Oral)  Resp 17  SpO2 91% Physical Exam  Constitutional: She is oriented to person, place, and time. She appears well-developed  and well-nourished. No distress.  HENT:  Head: Normocephalic and atraumatic.  Mouth/Throat: Oropharynx is clear and moist.  No trauma apparent to the head  Eyes: Conjunctivae and EOM are normal. Pupils are equal, round, and reactive to light.  Neck: Normal range of motion. Neck supple. No spinous process tenderness and no muscular tenderness present.  Cardiovascular: Normal rate, regular rhythm and intact distal pulses.   No murmur heard. Pulmonary/Chest: Effort normal and breath sounds normal. No respiratory distress. She has no wheezes. She has no rales.  Abdominal: Soft. She exhibits no distension. There is no tenderness. There is no rebound and no guarding.  Musculoskeletal: She exhibits tenderness. She exhibits no edema.       Left hip: She exhibits decreased range of motion, tenderness and bony tenderness. She exhibits no deformity.       Right knee: Normal.       Left knee: Normal.       Legs: Mild pain with palpation of the anterior right hip but full ROM without pain  Neurological: She is alert and oriented to person, place, and time.  Skin: Skin is warm and dry. No rash noted. No erythema.  Psychiatric: She has a normal mood and affect. Her behavior is normal.  Nursing note and vitals reviewed.   ED Course  Procedures (including critical care time) Labs Review Labs Reviewed  CBC WITH DIFFERENTIAL/PLATELET - Abnormal; Notable for the following:    WBC 12.0 (*)    Neutro Abs 8.9 (*)    All other components within normal limits  I-STAT CHEM 8, ED - Abnormal; Notable for the following:    BUN 38 (*)    Glucose, Bld 116 (*)    Calcium, Ion 1.12 (*)    All other components within normal limits  CBC WITH DIFFERENTIAL/PLATELET  URINALYSIS, ROUTINE W REFLEX MICROSCOPIC    Imaging Review Ct Pelvis Wo Contrast  02/19/2014   CLINICAL DATA:  Fall with left lateral hip pain.  EXAM: CT PELVIS WITHOUT CONTRAST  TECHNIQUE: Multidetector CT imaging of the pelvis was performed  following the standard protocol without intravenous contrast.  COMPARISON:  Radiography from earlier the same day  FINDINGS: There is no evidence of hip fracture or dislocation. No evidence of pelvic ring fracture or diastasis.  Prominent enthesophytes around the greater trochanters and anterior iliac bones. No asymmetric or notable hip joint narrowing. No focal bone lesion or osseous erosion.  There is lower lumbar facet arthropathy with overgrowth. The patient is status post L4-5 laminectomy.  No evidence of intra-abdominal  trauma.  IMPRESSION: 1. No evidence of hip or pelvis fracture. 2. If ongoing concern for occult injury, note that MRI is the most sensitive modality.   Electronically Signed   By: Jorje Guild M.D.   On: 02/19/2014 09:20   Dg Hip Unilat With Pelvis 2-3 Views Left  02/19/2014   CLINICAL DATA:  Fall this a.m.  Left hip pain.  EXAM: LEFT HIP (WITH PELVIS) 2-3 VIEWS  COMPARISON:  None.  FINDINGS: Degenerative changes lumbar spine and both hips. No acute bony or joint abnormality identified. No evidence of fracture dislocation. No focal bony abnormality identified.  IMPRESSION: Degenerative changes lumbar spine and both hips. No acute abnormality identified. No evidence of fracture or dislocation.   Electronically Signed   By: Marcello Moores  Register   On: 02/19/2014 07:41     EKG Interpretation None      MDM   Final diagnoses:  Fall, initial encounter  Left hip pain    Patient with a fall at home this morning in the bathroom. She says she lost her balance and fell. She denies any LOC, syncope or feeling dizzy.  She is awake alert and complaining of significant pain in the left hip. She was initially able to walk back to bed but then was unable to get out of bed due to pain. Her leg is not deformed or rotated. However she does have significant posterior left hip and anterior hip pain.  No headache or neck pain. No signs of trauma to the head. Sounds like she may have hit a door knob  when she fell however she is not on anticoagulation at this time do not feel that needs further evaluation.  Left hip films pending.  7:50 AM Hip imaging neg for acute pathology.  Will get CT to further evaluate for occult fx.  9:25 AM CT neg for acute fracture.  Discussed results with pt.  Will treat the pain and will have pt f/u with PCP.  If pain continues may need MRI.  10:58 AM Attempted to walk pt however she is having significant pain.  Will try some oral pain medication and if pt is still unable to ambulate will require admission.  11:42 AM Pt is still unable to ambulate despite pain meds due to pain.  Also daughter called and states she was concerned about her mom getting around safely at home as well as her PCP Dr. Harrington Challenger.  Pt was supposed to go PT after christmas due to back and leg issues however was unable because husband dx with CA and she is his primary care giver.  Feel the fall has probably worsened already problem area.  CBC, chem 8 and UA pending but pt will need admission for PT/OT and pain control.  Labs with slight leukocytosis of 12 and UA with concern for UTI.  Will treat with cipro and cultures pending.  2:16 PM Pt has no urinary complaint.  Urine cultured however went to d/c cipro however pt had already taken 1 dose.  Blanchie Dessert, MD 02/19/14 [id number]  Blanchie Dessert, MD 02/19/14 [id number]  Blanchie Dessert, MD 02/19/14 1323  Blanchie Dessert, MD 02/19/14 [place], MD 02/19/14 1416

## 2014-02-19 NOTE — ED Notes (Signed)
Pt could not ambulate to bathroom, due to pain. Pt had 1 staff assist with wheelchair and getting back into bed.

## 2014-02-19 NOTE — H&P (Signed)
History and Physical  Alexandra [name] [id number] DOB: 22-Oct-1927 DOA: 02/19/2014  Referring physician: Dr. Randa Spike, EDP PCP:  Melinda Crutch, MD  Outpatient Specialists:  1. Orthopedics: Dr. Gaynelle Arabian  Chief Complaint: Left hip pain s/p fall.  HPI: Alexandra [name] is a 81 y.o. female with history of hypothyroid, anxiety & depression, asthma, intermittent LBBB, obesity, peripheral neuropathy, hypercholesterolemia, presented to the ED with complaints of left hip pain status post fall. She was in her usual state of health until approximately 3:30 AM this morning when she had urgency to have a BM and got up from her bed and ambulated to the bathroom. Upon reaching the bathroom, she feels that she tripped on something and fell backwards on to her left hip. Her head brushed the door on the way down. She denied preceding dizziness, lightheadedness, chest pain or palpitations. No LOC. No reported bleeding. She was able to get up without pain and ambulate back to her bed. An hour later when she tried to get up again to use the bathroom, she noticed excruciating pain in the left hip and was unable to weight-bear. Her spouse called EMS and patient was brought to the emergency department. Evaluation in the ED including x-rays of the left hip with pelvis and CT of the pelvis without contrast did not reveal fractures. Patient was treated with pain medications with plan to return home from ED. However she continued to have significant pain and was unable to weight-bear. Hospitalist admission was thereby requested. Patient denies dysuria, urinary frequency, fever or chills.   Review of Systems: All systems reviewed and apart from history of presenting illness, are negative.  Past Medical History  Diagnosis Date  . Arthritis   . Headache(784.0)   . Anxiety   . Depression   . Asthma   . CAP (community acquired pneumonia) 12/19/2012  . Intermittent LBBB (left bundle branch block) 12/21/2012  .  Obesity   . Peripheral neuropathy   . Sacroiliitis, not elsewhere classified   . Osteopenia   . Hypercholesteremia   . Neuropathy   . Trigger finger     [name]  . Chest pain     NM stress test 05/2011 Normal  . DOE (dyspnea on exertion)     No SOB at rest  . Fatigue     excertional  . breast ca dx'd 2000    left   Past Surgical History  Procedure Laterality Date  . Leg surgery    . Breast lumpectomy      breast cancer  . Lumbar laminectomy    . Achilles tendon surgery    . Tonsillectomy    . Vesicovaginal fistula closure w/ tah      DC hysterectomy  . Polypectomy     Social History:  reports that she has never smoked. She has never used smokeless tobacco. She reports that she drinks alcohol. She reports that she does not use illicit drugs. Married. Lives with spouse and currently caring for him-has newly diagnosed lung cancer. At times uses a cane to ambulate but otherwise independent of activities of daily living.  Allergies  Allergen Reactions  . Cymbalta [Duloxetine Hcl] Other (See Comments)    Depression  . Lyrica [Pregabalin] Other (See Comments)    Blurry vision    Family History  Problem Relation Age of Onset  . Pneumonia Mother 69    cause of death  . Diabetes Brother   . Cancer Father  pancreatic  . Alzheimer's disease Sister   . CVA Daughter     01/21/09  . Colon cancer    . Breast cancer      Prior to Admission medications   Medication Sig Start Date End Date Taking? Authorizing Provider  alendronate (FOSAMAX) 70 MG tablet Take 70 mg by mouth every Saturday. Take with a full glass of water on an empty stomach.   Yes Historical Provider, MD  aspirin 81 MG chewable tablet Chew 81 mg by mouth daily.   Yes Historical Provider, MD  b complex vitamins tablet Take 1 tablet by mouth daily.   Yes Historical Provider, MD  Calcium-Vitamin D (CALTRATE 600 PLUS-VIT D PO) Take 1 tablet by mouth 2 (two) times daily.   Yes Historical Provider, MD    cholecalciferol (VITAMIN D) 1000 UNITS tablet Take 1,000 Units by mouth daily.   Yes Historical Provider, MD  GLUCOSAMINE PO Take 1 tablet by mouth daily.    Yes Historical Provider, MD  levothyroxine (SYNTHROID, LEVOTHROID) 25 MCG tablet Take 25 mcg by mouth daily before breakfast.   Yes Historical Provider, MD  Multiple Vitamin (MULITIVITAMIN WITH MINERALS) TABS Take 1 tablet by mouth daily.   Yes Historical Provider, MD  nitroGLYCERIN (NITROSTAT) 0.4 MG SL tablet Place 0.4 mg under the tongue every 5 (five) minutes as needed for chest pain.   Yes Historical Provider, MD  omega-3 acid ethyl esters (LOVAZA) 1 G capsule Take 1 g by mouth daily.   Yes Historical Provider, MD  traMADol (ULTRAM) 50 MG tablet Take 50 mg by mouth every 6 (six) hours as needed for moderate pain.   Yes Historical Provider, MD   Physical Exam: Filed Vitals:   02/19/14 0626 02/19/14 0733 02/19/14 1148  BP: 130/58  136/58  Pulse: 84 83 88  Temp: 98.2 F (36.8 C)  97.5 F (36.4 C)  TempSrc: Oral  Oral  Resp: 17  18  SpO2: 91% 96% 98%   patient examined with a female ED nurse chaperone in the room.   General exam: Moderately built and nourished pleasant elderly female patient, lying comfortably supine on the gurney in no obvious distress.  Head, eyes and ENT: Nontraumatic and normocephalic. Pupils equally reacting to light and accommodation. Oral mucosa moist.  Neck: Supple. No JVD, carotid bruit or thyromegaly.  Lymphatics: No lymphadenopathy.  Respiratory system: Clear to auscultation. No increased work of breathing.  Cardiovascular system: S1 and S2 heard, RRR. No JVD, murmurs, gallops, clicks or pedal edema.  Gastrointestinal system: Abdomen is nondistended, soft and nontender. Normal bowel sounds heard. No organomegaly or masses appreciated.  Central nervous system: Alert and oriented. No focal neurological deficits.  Extremities: Symmetric 5 x 5 power. Peripheral pulses symmetrically felt. No  external bruises noticed.  Skin: No rashes or acute findings.  Musculoskeletal system: Negative exam. Mild tenderness over the left hip region and left lower paraspinal region with mild muscle stiffness but no external bruising or any other acute findings.  Psychiatry: Pleasant and cooperative.   Labs on Admission:  Basic Metabolic Panel:  Recent Labs Lab 02/19/14 1206  NA 139  K 4.8  CL 106  GLUCOSE 116*  BUN 38*  CREATININE 0.90   Liver Function Tests: No results for input(s): AST, ALT, ALKPHOS, BILITOT, PROT, ALBUMIN in the last 168 hours. No results for input(s): LIPASE, AMYLASE in the last 168 hours. No results for input(s): AMMONIA in the last 168 hours. CBC:  Recent Labs Lab 02/19/14 1206 02/19/14 1231  WBC  --  12.0*  NEUTROABS  --  8.9*  HGB 14.6 13.8  HCT 43.0 41.2  MCV  --  95.8  PLT  --  186   Cardiac Enzymes: No results for input(s): CKTOTAL, CKMB, CKMBINDEX, TROPONINI in the last 168 hours.  BNP (last 3 results) No results for input(s): PROBNP in the last 8760 hours. CBG: No results for input(s): GLUCAP in the last 168 hours.  Radiological Exams on Admission: Ct Pelvis Wo Contrast  02/19/2014   CLINICAL DATA:  Fall with left lateral hip pain.  EXAM: CT PELVIS WITHOUT CONTRAST  TECHNIQUE: Multidetector CT imaging of the pelvis was performed following the standard protocol without intravenous contrast.  COMPARISON:  Radiography from earlier the same day  FINDINGS: There is no evidence of hip fracture or dislocation. No evidence of pelvic ring fracture or diastasis.  Prominent enthesophytes around the greater trochanters and anterior iliac bones. No asymmetric or notable hip joint narrowing. No focal bone lesion or osseous erosion.  There is lower lumbar facet arthropathy with overgrowth. The patient is status post L4-5 laminectomy.  No evidence of intra-abdominal trauma.  IMPRESSION: 1. No evidence of hip or pelvis fracture. 2. If ongoing concern for occult  injury, note that MRI is the most sensitive modality.   Electronically Signed   By: Jorje Guild M.D.   On: 02/19/2014 09:20   Dg Hip Unilat With Pelvis 2-3 Views Left  02/19/2014   CLINICAL DATA:  Fall this a.m.  Left hip pain.  EXAM: LEFT HIP (WITH PELVIS) 2-3 VIEWS  COMPARISON:  None.  FINDINGS: Degenerative changes lumbar spine and both hips. No acute bony or joint abnormality identified. No evidence of fracture dislocation. No focal bony abnormality identified.  IMPRESSION: Degenerative changes lumbar spine and both hips. No acute abnormality identified. No evidence of fracture or dislocation.   Electronically Signed   By: Marcello Moores  Register   On: 02/19/2014 07:41    EKG: No EKG in Epic for today.  Assessment/Plan Principal Problem:   Acute hip pain- left Active Problems:   Anxiety   Asthma   Elevated blood pressure   Fall   Hip pain, acute   1. Acute left hip pain, status post mechanical fall: Left hip with pelvic x-rays and CT of pelvis without contrast did not show any evidence of fracture or dislocation of hips, pelvis or lower lumbar spine. Admit for pain management, PT and OT evaluation. If does not improve, may consider further evaluation i.e. MRI of left hip and orthopedic consultation. 2. Hypothyroid: Continue Synthroid. 3. Hyper cholesterolemia: Continue home medications. 4. Mild leukocytosis: Likely stress response. No urinary symptoms and UA not impressive for UTI. Hold antibiotics. Follow CBC in a.m. 5. History of asthma: Stable     Code Status: Full  Family Communication: Discussed with daughter-in-law at bedside.  Disposition Plan: Home when medically stable   Time spent: 10 minutes  Lyris Hitchman, MD, FACP, FHM. [facility] Pager [phone]  If 7PM-7AM, please contact night-coverage [email or web address] Password [email or web address] 02/19/2014, 2:45 PM

## 2014-02-20 DIAGNOSIS — J45909 Unspecified asthma, uncomplicated: Secondary | ICD-10-CM | POA: Diagnosis not present

## 2014-02-20 DIAGNOSIS — I447 Left bundle-branch block, unspecified: Secondary | ICD-10-CM | POA: Diagnosis not present

## 2014-02-20 DIAGNOSIS — M25552 Pain in left hip: Secondary | ICD-10-CM | POA: Diagnosis not present

## 2014-02-20 DIAGNOSIS — R030 Elevated blood-pressure reading, without diagnosis of hypertension: Secondary | ICD-10-CM

## 2014-02-20 DIAGNOSIS — S79911A Unspecified injury of right hip, initial encounter: Secondary | ICD-10-CM | POA: Diagnosis not present

## 2014-02-20 DIAGNOSIS — M1990 Unspecified osteoarthritis, unspecified site: Secondary | ICD-10-CM | POA: Diagnosis not present

## 2014-02-20 DIAGNOSIS — M25551 Pain in right hip: Secondary | ICD-10-CM | POA: Diagnosis not present

## 2014-02-20 DIAGNOSIS — F329 Major depressive disorder, single episode, unspecified: Secondary | ICD-10-CM | POA: Diagnosis not present

## 2014-02-20 DIAGNOSIS — E669 Obesity, unspecified: Secondary | ICD-10-CM | POA: Diagnosis not present

## 2014-02-20 DIAGNOSIS — S79912A Unspecified injury of left hip, initial encounter: Secondary | ICD-10-CM | POA: Diagnosis not present

## 2014-02-20 DIAGNOSIS — G629 Polyneuropathy, unspecified: Secondary | ICD-10-CM | POA: Diagnosis not present

## 2014-02-20 DIAGNOSIS — F419 Anxiety disorder, unspecified: Secondary | ICD-10-CM | POA: Diagnosis not present

## 2014-02-20 LAB — CBC
HEMATOCRIT: 36.9 % (ref 36.0–46.0)
Hemoglobin: 11.7 g/dL — ABNORMAL LOW (ref 12.0–15.0)
MCH: 31.1 pg (ref 26.0–34.0)
MCHC: 31.7 g/dL (ref 30.0–36.0)
MCV: 98.1 fL (ref 78.0–100.0)
PLATELETS: 188 10*3/uL (ref 150–400)
RBC: 3.76 MIL/uL — ABNORMAL LOW (ref 3.87–5.11)
RDW: 13.3 % (ref 11.5–15.5)
WBC: 7.1 10*3/uL (ref 4.0–10.5)

## 2014-02-20 LAB — BASIC METABOLIC PANEL
Anion gap: 3 — ABNORMAL LOW (ref 5–15)
BUN: 34 mg/dL — ABNORMAL HIGH (ref 6–23)
CALCIUM: 8.8 mg/dL (ref 8.4–10.5)
CO2: 29 mmol/L (ref 19–32)
Chloride: 106 mmol/L (ref 96–112)
Creatinine, Ser: 1.25 mg/dL — ABNORMAL HIGH (ref 0.50–1.10)
GFR calc Af Amer: 44 mL/min — ABNORMAL LOW (ref >90)
GFR calc non Af Amer: 38 mL/min — ABNORMAL LOW (ref >90)
Glucose, Bld: 101 mg/dL — ABNORMAL HIGH (ref 70–99)
Potassium: 4.3 mmol/L (ref 3.5–5.1)
Sodium: 138 mmol/L (ref 135–145)

## 2014-02-20 LAB — URINE CULTURE: Colony Count: 50000

## 2014-02-20 MED ORDER — HYDROCODONE-ACETAMINOPHEN 5-325 MG PO TABS: 2.0000 | ORAL_TABLET | ORAL | Status: DC | PRN

## 2014-02-20 MED ORDER — HYDROCODONE-ACETAMINOPHEN 5-325 MG PO TABS
2.0000 | ORAL_TABLET | ORAL | Status: DC | PRN
  Administered 2014-02-20 (×3): 2 via ORAL
  Filled 2014-02-20 (×3): qty 2

## 2014-02-20 NOTE — Progress Notes (Signed)
UR completed 

## 2014-02-20 NOTE — Progress Notes (Signed)
Pt discharged home with spouse and daughter in law in stable condition. Discharge instructions and scripts given. Pt verbalized understanding

## 2014-02-20 NOTE — Progress Notes (Signed)
[facility] PROGRESS NOTE  Assessment/Plan: Acute hip pain- left/Hip pain, acute - Straight leg raise negative external rotation of the head is also negative. - CT scan of the hip was negative for fracture, PT consult pending continue Norco. - Probably [place] [facility] after a physical therapy evaluation. - She refuses skilled nursing facility.   Elevated blood pressure: - Bp < 130/90, elevation ov BP most likely due to Pain.   Code Status: Full  Family Communication: Discussed with daughter-in-law at bedside.  Disposition Plan: Home when medically stable    Consultants:  Ortho  Procedures:  CT left hip  Antibiotics:  None  HPI/Subjective: Relates she still has mild pain with movement, she does not want to go to skilled nursing facility.  Objective: Filed Vitals:   02/19/14 0733 02/19/14 1148 02/19/14 1617 02/20/14 0623  BP:  136/58 123/52 121/63  Pulse: 83 88 80 70  Temp:  97.5 F (36.4 C) 98.2 F (36.8 C) 97.6 F (36.4 C)  TempSrc:  Oral  Oral  Resp:  18 18 18   Height:   5\' 4"  (1.626 m)   Weight:   91.173 kg (201 lb)   SpO2: 96% 98% 99% 91%   No intake or output data in the 24 hours ending 02/20/14 0924 Filed Weights   02/19/14 1617  Weight: 91.173 kg (201 lb)    Exam:  General: Alert, awake, oriented x3, in no acute distress.  HEENT: No bruits, no goiter.  Heart: Regular rate and rhythm. Lungs: Good air movement, clear  Abdomen: Soft, non-tender. Neuro: Grossly intact, nonfocal.   Data Reviewed: Basic Metabolic Panel:  Recent Labs Lab 02/19/14 1206 02/20/14 0436  NA 139 138  K 4.8 4.3  CL 106 106  CO2  --  29  GLUCOSE 116* 101*  BUN 38* 34*  CREATININE 0.90 1.25*  CALCIUM  --  8.8   Liver Function Tests: No results for input(s): AST, ALT, ALKPHOS, BILITOT, PROT, ALBUMIN in the last 168 hours. No results for input(s): LIPASE, AMYLASE in the last 168 hours. No results for input(s): AMMONIA in the last 168 hours. CBC:  Recent  Labs Lab 02/19/14 1206 02/19/14 1231 02/20/14 0436  WBC  --  12.0* 7.1  NEUTROABS  --  8.9*  --   HGB 14.6 13.8 11.7*  HCT 43.0 41.2 36.9  MCV  --  95.8 98.1  PLT  --  186 188   Cardiac Enzymes: No results for input(s): CKTOTAL, CKMB, CKMBINDEX, TROPONINI in the last 168 hours. BNP (last 3 results) No results for input(s): BNP in the last 8760 hours.  ProBNP (last 3 results) No results for input(s): PROBNP in the last 8760 hours.  CBG: No results for input(s): GLUCAP in the last 168 hours.  No results found for this or any previous visit (from the past 240 hour(s)).   Studies: Ct Pelvis Wo Contrast  02/19/2014   CLINICAL DATA:  Fall with left lateral hip pain.  EXAM: CT PELVIS WITHOUT CONTRAST  TECHNIQUE: Multidetector CT imaging of the pelvis was performed following the standard protocol without intravenous contrast.  COMPARISON:  Radiography from earlier the same day  FINDINGS: There is no evidence of hip fracture or dislocation. No evidence of pelvic ring fracture or diastasis.  Prominent enthesophytes around the greater trochanters and anterior iliac bones. No asymmetric or notable hip joint narrowing. No focal bone lesion or osseous erosion.  There is lower lumbar facet arthropathy with overgrowth. The patient is status post L4-5 laminectomy.  No evidence of intra-abdominal trauma.  IMPRESSION: 1. No evidence of hip or pelvis fracture. 2. If ongoing concern for occult injury, note that MRI is the most sensitive modality.   Electronically Signed   By: Jorje Guild M.D.   On: 02/19/2014 09:20   Dg Hip Unilat With Pelvis 2-3 Views Left  02/19/2014   CLINICAL DATA:  Fall this a.m.  Left hip pain.  EXAM: LEFT HIP (WITH PELVIS) 2-3 VIEWS  COMPARISON:  None.  FINDINGS: Degenerative changes lumbar spine and both hips. No acute bony or joint abnormality identified. No evidence of fracture dislocation. No focal bony abnormality identified.  IMPRESSION: Degenerative changes lumbar spine and  both hips. No acute abnormality identified. No evidence of fracture or dislocation.   Electronically Signed   By: Marcello Moores  Register   On: 02/19/2014 07:41    Scheduled Meds: . aspirin  81 mg Oral Daily  . cholecalciferol  1,000 Units Oral Daily  . cyclobenzaprine  5 mg Oral TID  . enoxaparin (LOVENOX) injection  40 mg Subcutaneous Q24H  . ibuprofen  600 mg Oral QID  . levothyroxine  25 mcg Oral QAC breakfast  . omega-3 acid ethyl esters  1 g Oral Daily  . sodium chloride  3 mL Intravenous Q12H   Continuous Infusions:    Charlynne Cousins  [facility] Pager [phone]. If 8PM-8AM, please contact night-coverage at [email or web address], password [email or web address] 02/20/2014, 9:24 AM  LOS: 1 day

## 2014-02-20 NOTE — Evaluation (Signed)
Physical Therapy Evaluation Patient Details Name: Alexandra [name] MRN: [id number] DOB: 31-Mar-1927 Today's Date: 02/20/2014   History of Present Illness  pt is s/p fall resulting in L hip pain; xray, CT negative for fx  Clinical Impression  Pt s/p fall presents with functional mobility limitations 2* residual L hip pain and associated affect on balance and  L LE strength.  Pt plans d/c home with family assist and would benefit from follow up HHPT to maximize IND and safety.    Follow Up Recommendations Home health PT    Equipment Recommendations  None recommended by PT    Recommendations for Other Services OT consult     Precautions / Restrictions Precautions Precautions: Fall Restrictions Weight Bearing Restrictions: No Other Position/Activity Restrictions: WBAT      Mobility  Bed Mobility Overal bed mobility: Needs Assistance Bed Mobility: Supine to Sit     Supine to sit: Min guard     General bed mobility comments: cues for sequence - use of rail to self assist  Transfers Overall transfer level: Needs assistance Equipment used: Rolling walker (2 wheeled) Transfers: Sit to/from Stand Sit to Stand: Min guard         General transfer comment: cues for UE placement  Ambulation/Gait Ambulation/Gait assistance: Min assist;Min guard Ambulation Distance (Feet): 200 Feet Assistive device: Rolling walker (2 wheeled) Gait Pattern/deviations: Step-to pattern;Step-through pattern;Shuffle;Decreased step length - right;Decreased step length - left Gait velocity: decreased   General Gait Details: cues for posture and position from RW  Stairs Stairs: Yes Stairs assistance: Min assist Stair Management: Two rails;Step to pattern;Forwards Number of Stairs: 5 General stair comments: cues for sequence  Wheelchair Mobility    Modified Rankin (Stroke Patients Only)       Balance                                             Pertinent Vitals/Pain  Pain Assessment: Faces Faces Pain Scale: Hurts little more Pain Location: L hip Pain Descriptors / Indicators: Sore Pain Intervention(s): Limited activity within patient's tolerance;Monitored during session;Premedicated before session    Home Living Family/patient expects to be discharged to:: Private residence Living Arrangements: Spouse/significant other Available Help at Discharge: Family Type of Home: House Home Access: Stairs to enter Entrance Stairs-Rails: Right;Left;Can reach both Entrance Stairs-Number of Steps: 2 Home Layout: Able to live on main level with bedroom/bathroom Home Equipment: Walker - 2 wheels;Toilet riser;Shower seat;Grab bars - toilet Additional Comments: pt states 5 children are working out staying with her and her husband, since her fall:  not all are local.  Husband recently dx'd with stage 4 CA    Prior Function Level of Independence: Independent with assistive device(s)         Comments: used cane     Hand Dominance        Extremity/Trunk Assessment   Upper Extremity Assessment: Overall WFL for tasks assessed           Lower Extremity Assessment: LLE deficits/detail   LLE Deficits / Details: Strength ~3/5 pain limited  Cervical / Trunk Assessment: Normal  Communication   Communication: No difficulties  Cognition Arousal/Alertness: Awake/alert Behavior During Therapy: WFL for tasks assessed/performed Overall Cognitive Status: Within Functional Limits for tasks assessed                      General Comments  Exercises General Exercises - Lower Extremity Ankle Circles/Pumps: AROM;Both;10 reps;Supine Heel Slides: AAROM;Left;10 reps;Supine Hip ABduction/ADduction: AAROM;Left;10 reps;Supine      Assessment/Plan    PT Assessment Patient needs continued PT services  PT Diagnosis Difficulty walking   PT Problem List Decreased strength;Decreased activity tolerance;Decreased mobility;Decreased knowledge of use of  DME;Pain  PT Treatment Interventions DME instruction;Gait training;Stair training;Functional mobility training;Therapeutic activities;Therapeutic exercise;Patient/family education   PT Goals (Current goals can be found in the Care Plan section) Acute Rehab PT Goals Patient Stated Goal: home PT Goal Formulation: With patient Time For Goal Achievement: 02/27/14 Potential to Achieve Goals: Good    Frequency Min 3X/week   Barriers to discharge        Co-evaluation               End of Session   Activity Tolerance: Patient tolerated treatment well Patient left: in chair;with call bell/phone within reach Nurse Communication: Mobility status    Functional Assessment Tool Used: clinical judgement Functional Limitation: Mobility: Walking and moving around Mobility: Walking and Moving Around Current Status [id number]): At least 20 percent but less than 40 percent impaired, limited or restricted Mobility: Walking and Moving Around Goal Status [phone]): At least 1 percent but less than 20 percent impaired, limited or restricted    Time: 1013-1050 PT Time Calculation (min) (ACUTE ONLY): 37 min   Charges:   PT Evaluation $Initial PT Evaluation Tier I: 1 Procedure PT Treatments $Gait Training: 8-22 mins   PT G Codes:   PT G-Codes **NOT FOR INPATIENT CLASS** Functional Assessment Tool Used: clinical judgement Functional Limitation: Mobility: Walking and moving around Mobility: Walking and Moving Around Current Status [id number]): At least 20 percent but less than 40 percent impaired, limited or restricted Mobility: Walking and Moving Around Goal Status [phone]): At least 1 percent but less than 20 percent impaired, limited or restricted    Alexandra [name] 02/20/2014, 12:46 PM

## 2014-02-20 NOTE — Evaluation (Signed)
Occupational Therapy Evaluation Patient Details Name: Alexandra [name] MRN: [id number] DOB: [birth year] Today's Date: 02/20/2014    History of Present Illness pt is s/p fall resulting in L hip pain; xray, CT negative for fx   Clinical Impression   This 81 year old female was admitted for the above.  She will benefit from skilled OT to increase safety and independence with adls.  Pt was mod I prior to admission.  Goals in acute are for supervision level overall.      Follow Up Recommendations  Home health OT;Supervision/Assistance - 24 hour    Equipment Recommendations  None recommended by OT    Recommendations for Other Services       Precautions / Restrictions Precautions Precautions: Fall Restrictions Weight Bearing Restrictions: No Other Position/Activity Restrictions: WBAT      Mobility Bed Mobility                  Transfers Overall transfer level: Needs assistance Equipment used: Rolling walker (2 wheeled) Transfers: Sit to/from Stand Sit to Stand: Min guard         General transfer comment: cues for UE placement.  Pt a little unsteady when first getting up    Balance                                            ADL Overall ADL's : Needs assistance/impaired             Lower Body Bathing: Minimal assistance;Sit to/from stand       Lower Body Dressing: Minimal assistance;Sit to/from stand   Toilet Transfer: Min guard;Grab bars;Ambulation   Toileting- Clothing Manipulation and Hygiene: Set up;Sitting/lateral lean         General ADL Comments: pt needs min A for reaching to L foot for adls.  Educated on shower transfer, with RW, but she wants to be able to go in forward so she doesn't get disoriented:  explained sequence but did not practice this session.  Recommended someone is with her when she gets in and out of shower.  Pt states all of her falls have occurred in the bathroom, but they have happened over a period of  years.  Last one was last Sept (prior to this)     Vision                     Perception     Praxis      Pertinent Vitals/Pain Pain Assessment: Faces Faces Pain Scale: Hurts little more Pain Location: L hip Pain Descriptors / Indicators: Sore Pain Intervention(s): Limited activity within patient's tolerance;Monitored during session;Repositioned;Ice applied     Hand Dominance     Extremity/Trunk Assessment Upper Extremity Assessment Upper Extremity Assessment: Overall WFL for tasks assessed           Communication Communication Communication: No difficulties   Cognition Arousal/Alertness: Awake/alert Behavior During Therapy: WFL for tasks assessed/performed Overall Cognitive Status: Within Functional Limits for tasks assessed                     General Comments       Exercises       Shoulder Instructions      Home Living Family/patient expects to be discharged to:: Private residence Living Arrangements: Spouse/significant other Available Help at Discharge: Family  Bathroom Shower/Tub: Occupational psychologist: Standard     Home Equipment: Environmental consultant - 2 wheels;Toilet riser;Shower seat;Grab bars - toilet   Additional Comments: pt states 5 children are working out staying with her and her husband, since her fall:  not all are local.  Husband recently dx'd with stage 4 CA      Prior Functioning/Environment Level of Independence: Independent with assistive device(s)        Comments: used cane    OT Diagnosis: Generalized weakness   OT Problem List: Decreased strength;Decreased activity tolerance;Decreased knowledge of use of DME or AE;Impaired balance (sitting and/or standing);Pain   OT Treatment/Interventions: Self-care/ADL training;DME and/or AE instruction;Patient/family education;Balance training    OT Goals(Current goals can be found in the care plan section) Acute Rehab OT Goals Patient Stated Goal:  home OT Goal Formulation: With patient Time For Goal Achievement: 02/27/14 Potential to Achieve Goals: Good ADL Goals Pt Will Perform Lower Body Bathing: with supervision;sit to/from stand Pt Will Perform Lower Body Dressing: with supervision;sit to/from stand Pt Will Transfer to Toilet: with supervision;ambulating (toilet with riser) Pt Will Perform Tub/Shower Transfer: with min guard assist;Shower transfer;shower seat;ambulating  OT Frequency: Min 2X/week   Barriers to D/C:            Co-evaluation              End of Session    Activity Tolerance: Patient tolerated treatment well Patient left: in chair;with call bell/phone within reach   Time: [id number] OT Time Calculation (min): 22 min Charges:  OT General Charges $OT Visit: 1 Procedure OT Evaluation $Initial OT Evaluation Tier I: 1 Procedure G-Codes: OT G-codes **NOT FOR INPATIENT CLASS** Functional Assessment Tool Used: clinical observation and judgment Functional Limitation: Self care Self Care Current Status [id number]): At least 20 percent but less than 40 percent impaired, limited or restricted Self Care Goal Status [id number]): At least 20 percent but less than 40 percent impaired, limited or restricted  Alexandra [name] 02/20/2014, 12:12 PM   Lesle Chris, OTR/L [phone] 02/20/2014

## 2014-02-20 NOTE — Discharge Summary (Signed)
Physician Discharge Summary  Alexandra [name] [id number] DOB: August 19, 1927 DOA: 02/19/2014  PCP:  Melinda Crutch, MD  Admit date: 02/19/2014 Discharge date: 02/20/2014  Time spent:30 minutes  Recommendations for Outpatient Follow-up:  1. Follow up with Home PT and home health  Discharge Diagnoses:  Principal Problem:   Acute hip pain- left Active Problems:   Anxiety   Asthma   Elevated blood pressure   Fall   Hip pain, acute   Discharge Condition: stable  Diet recommendation: heart healthy  Filed Weights   02/19/14 1617  Weight: 91.173 kg (201 lb)    History of present illness:  81 y.o. female with history of hypothyroid, anxiety & depression, asthma, intermittent LBBB, obesity, peripheral neuropathy, hypercholesterolemia, presented to the ED with complaints of left hip pain status post fall. She was in her usual state of health until approximately 3:30 AM this morning when she had urgency to have a BM and got up from her bed and ambulated to the bathroom. Upon reaching the bathroom, she feels that she tripped on something and fell backwards on to her left hip. Her head brushed the door on the way down. She denied preceding dizziness, lightheadedness, chest pain or palpitations. No LOC. No reported bleeding. She was able to get up without pain and ambulate back to her bed. An hour later when she tried to get up again to use the bathroom, she noticed excruciating pain in the left hip and was unable to weight-bear. Her spouse called EMS and patient was brought to the emergency department. Evaluation in the ED including x-rays of the left hip with pelvis and CT of the pelvis without contrast did not reveal fractures. Patient was treated with pain medications with plan to return home from ED. However she continued to have significant pain and was unable to weight-bear. Hospitalist admission was thereby requested. Patient denies dysuria, urinary frequency, fever or chills.  Hospital Course:   Acute hip pain- left/Hip pain, acute - Straight leg raise negative external rotation of the head is also negative. - CT scan of the hip was negative for fracture, PT consult rec pt at home, continue [place]. - She refuses skilled nursing facility.  Elevated blood pressure: - Bp < 130/90, elevation ov BP most likely due to Pain.  Procedures:  CT left hip  Consultations:  none  Discharge Exam: Filed Vitals:   02/20/14 1331  BP: 112/49  Pulse: 71  Temp: 97.9 F (36.6 C)  Resp: 18    General: see progress note  Discharge Instructions   Discharge Instructions    Diet - low sodium heart healthy    Complete by:  As directed      Increase activity slowly    Complete by:  As directed           Current Discharge Medication List    START taking these medications   Details  HYDROcodone-acetaminophen (NORCO/VICODIN) 5-325 MG per tablet Take 2 tablets by mouth every 4 (four) hours as needed for moderate pain. Qty: 30 tablet, Refills: 0      CONTINUE these medications which have NOT CHANGED   Details  alendronate (FOSAMAX) 70 MG tablet Take 70 mg by mouth every Saturday. Take with a full glass of water on an empty stomach.    aspirin 81 MG chewable tablet Chew 81 mg by mouth daily.    b complex vitamins tablet Take 1 tablet by mouth daily.    Calcium-Vitamin D (CALTRATE 600 PLUS-VIT D PO) Take 1  tablet by mouth 2 (two) times daily.    cholecalciferol (VITAMIN D) 1000 UNITS tablet Take 1,000 Units by mouth daily.    GLUCOSAMINE PO Take 1 tablet by mouth daily.     levothyroxine (SYNTHROID, LEVOTHROID) 25 MCG tablet Take 25 mcg by mouth daily before breakfast.    Multiple Vitamin (MULITIVITAMIN WITH MINERALS) TABS Take 1 tablet by mouth daily.    nitroGLYCERIN (NITROSTAT) 0.4 MG SL tablet Place 0.4 mg under the tongue every 5 (five) minutes as needed for chest pain.    omega-3 acid ethyl esters (LOVAZA) 1 G capsule Take 1 g by mouth daily.    traMADol (ULTRAM) 50  MG tablet Take 50 mg by mouth every 6 (six) hours as needed for moderate pain.       Allergies  Allergen Reactions  . Cymbalta [Duloxetine Hcl] Other (See Comments)    Depression  . Lyrica [Pregabalin] Other (See Comments)    Blurry vision   Follow-up Information    Follow up with [facility]. Schedule an appointment as soon as possible for a visit in 1 week.   Specialty:  Specialist   Why:  As needed, If symptoms worsen   Contact information:   [address] [place] [address] [phone]        The results of significant diagnostics from this hospitalization (including imaging, microbiology, ancillary and laboratory) are listed below for reference.    Significant Diagnostic Studies: Ct Pelvis Wo Contrast  02/19/2014   CLINICAL DATA:  Fall with left lateral hip pain.  EXAM: CT PELVIS WITHOUT CONTRAST  TECHNIQUE: Multidetector CT imaging of the pelvis was performed following the standard protocol without intravenous contrast.  COMPARISON:  Radiography from earlier the same day  FINDINGS: There is no evidence of hip fracture or dislocation. No evidence of pelvic ring fracture or diastasis.  Prominent enthesophytes around the greater trochanters and anterior iliac bones. No asymmetric or notable hip joint narrowing. No focal bone lesion or osseous erosion.  There is lower lumbar facet arthropathy with overgrowth. The patient is status post L4-5 laminectomy.  No evidence of intra-abdominal trauma.  IMPRESSION: 1. No evidence of hip or pelvis fracture. 2. If ongoing concern for occult injury, note that MRI is the most sensitive modality.   Electronically Signed   By: Jorje Guild M.D.   On: 02/19/2014 09:20   Dg Hip Unilat With Pelvis 2-3 Views Left  02/19/2014   CLINICAL DATA:  Fall this a.m.  Left hip pain.  EXAM: LEFT HIP (WITH PELVIS) 2-3 VIEWS  COMPARISON:  None.  FINDINGS: Degenerative changes lumbar spine and both hips. No acute bony or joint  abnormality identified. No evidence of fracture dislocation. No focal bony abnormality identified.  IMPRESSION: Degenerative changes lumbar spine and both hips. No acute abnormality identified. No evidence of fracture or dislocation.   Electronically Signed   By: Marcello Moores  Register   On: 02/19/2014 07:41    Microbiology: No results found for this or any previous visit (from the past 240 hour(s)).   Labs: Basic Metabolic Panel:  Recent Labs Lab 02/19/14 1206 02/20/14 0436  NA 139 138  K 4.8 4.3  CL 106 106  CO2  --  29  GLUCOSE 116* 101*  BUN 38* 34*  CREATININE 0.90 1.25*  CALCIUM  --  8.8   Liver Function Tests: No results for input(s): AST, ALT, ALKPHOS, BILITOT, PROT, ALBUMIN in the last 168 hours. No results for input(s): LIPASE, AMYLASE in the  last 168 hours. No results for input(s): AMMONIA in the last 168 hours. CBC:  Recent Labs Lab 02/19/14 1206 02/19/14 1231 02/20/14 0436  WBC  --  12.0* 7.1  NEUTROABS  --  8.9*  --   HGB 14.6 13.8 11.7*  HCT 43.0 41.2 36.9  MCV  --  95.8 98.1  PLT  --  186 188   Cardiac Enzymes: No results for input(s): CKTOTAL, CKMB, CKMBINDEX, TROPONINI in the last 168 hours. BNP: BNP (last 3 results) No results for input(s): BNP in the last 8760 hours.  ProBNP (last 3 results) No results for input(s): PROBNP in the last 8760 hours.  CBG: No results for input(s): GLUCAP in the last 168 hours.     Signed:  Charlynne Cousins  [facility] 02/20/2014, 3:37 PM

## 2014-02-20 NOTE — Care Management Note (Signed)
CARE MANAGEMENT NOTE 02/20/2014  Patient:  ROSALIE, MIDDLEBROOK A   Account Number:  [id number]  Date Initiated:  02/20/2014  Documentation initiated by:  Marney Doctor  Subjective/Objective Assessment:   81 yo admitted with hip pain     Action/Plan:   From home with spouse   Anticipated DC Date:  02/20/2014   Anticipated DC Plan:  [place]  CM consult      [facility] Choice  HOME HEALTH   Choice offered to / List presented to:  C-1 Patient        [place] arranged  [place].   Status of service:  In process, will continue to follow Medicare Important Message given?   (If response is "NO", the following Medicare IM given date fields will be blank) Date Medicare IM given:   Medicare IM given by:   Date Additional Medicare IM given:   Additional Medicare IM given by:    Discharge Disposition:    Per UR Regulation:  Reviewed for med. necessity/level of care/duration of stay  If discussed at [place] of Stay Meetings, dates discussed:    Comments:  02/20/14 Marney Doctor RN,BSN,NCM [id number] PT/OT are recommending HHPT/OT.  Pt offered choice, and chose [facility].  [facility] rep informed me that they are not in network with pts insurance.  Pt given information and then chose [facility].  [facility] rep given referral. MD text paged for orders.  No other CM needs noted at this time.

## 2014-02-20 NOTE — Progress Notes (Signed)
CSW was following for potential SNF placement.  CSW reviewed chart and noted that PT/OT recommending home health services. Per chart review, pt children plan to assist at home.  No further social work needs identified at this time.  CSW signing off.   Please re-consult if social work needs arise.  Alison Murray, MSW, [facility] [phone]

## 2014-02-21 DIAGNOSIS — M25552 Pain in left hip: Secondary | ICD-10-CM | POA: Diagnosis not present

## 2014-02-21 DIAGNOSIS — G629 Polyneuropathy, unspecified: Secondary | ICD-10-CM | POA: Diagnosis not present

## 2014-02-21 DIAGNOSIS — E780 Pure hypercholesterolemia: Secondary | ICD-10-CM | POA: Diagnosis not present

## 2014-02-21 DIAGNOSIS — E039 Hypothyroidism, unspecified: Secondary | ICD-10-CM | POA: Diagnosis not present

## 2014-02-21 DIAGNOSIS — F341 Dysthymic disorder: Secondary | ICD-10-CM | POA: Diagnosis not present

## 2014-02-21 DIAGNOSIS — E669 Obesity, unspecified: Secondary | ICD-10-CM | POA: Diagnosis not present

## 2014-02-21 DIAGNOSIS — M1990 Unspecified osteoarthritis, unspecified site: Secondary | ICD-10-CM | POA: Diagnosis not present

## 2014-02-21 DIAGNOSIS — W19XXXD Unspecified fall, subsequent encounter: Secondary | ICD-10-CM | POA: Diagnosis not present

## 2014-02-24 DIAGNOSIS — M1990 Unspecified osteoarthritis, unspecified site: Secondary | ICD-10-CM | POA: Diagnosis not present

## 2014-02-24 DIAGNOSIS — E669 Obesity, unspecified: Secondary | ICD-10-CM | POA: Diagnosis not present

## 2014-02-24 DIAGNOSIS — G629 Polyneuropathy, unspecified: Secondary | ICD-10-CM | POA: Diagnosis not present

## 2014-02-24 DIAGNOSIS — W19XXXD Unspecified fall, subsequent encounter: Secondary | ICD-10-CM | POA: Diagnosis not present

## 2014-02-24 DIAGNOSIS — E780 Pure hypercholesterolemia: Secondary | ICD-10-CM | POA: Diagnosis not present

## 2014-02-24 DIAGNOSIS — F341 Dysthymic disorder: Secondary | ICD-10-CM | POA: Diagnosis not present

## 2014-02-24 DIAGNOSIS — M25552 Pain in left hip: Secondary | ICD-10-CM | POA: Diagnosis not present

## 2014-02-24 DIAGNOSIS — E039 Hypothyroidism, unspecified: Secondary | ICD-10-CM | POA: Diagnosis not present

## 2014-02-25 DIAGNOSIS — M1990 Unspecified osteoarthritis, unspecified site: Secondary | ICD-10-CM | POA: Diagnosis not present

## 2014-02-25 DIAGNOSIS — W19XXXD Unspecified fall, subsequent encounter: Secondary | ICD-10-CM | POA: Diagnosis not present

## 2014-02-25 DIAGNOSIS — M25552 Pain in left hip: Secondary | ICD-10-CM | POA: Diagnosis not present

## 2014-02-25 DIAGNOSIS — E780 Pure hypercholesterolemia: Secondary | ICD-10-CM | POA: Diagnosis not present

## 2014-02-25 DIAGNOSIS — F341 Dysthymic disorder: Secondary | ICD-10-CM | POA: Diagnosis not present

## 2014-02-25 DIAGNOSIS — E039 Hypothyroidism, unspecified: Secondary | ICD-10-CM | POA: Diagnosis not present

## 2014-02-25 DIAGNOSIS — G629 Polyneuropathy, unspecified: Secondary | ICD-10-CM | POA: Diagnosis not present

## 2014-02-25 DIAGNOSIS — E669 Obesity, unspecified: Secondary | ICD-10-CM | POA: Diagnosis not present

## 2014-02-26 DIAGNOSIS — E780 Pure hypercholesterolemia: Secondary | ICD-10-CM | POA: Diagnosis not present

## 2014-02-26 DIAGNOSIS — E669 Obesity, unspecified: Secondary | ICD-10-CM | POA: Diagnosis not present

## 2014-02-26 DIAGNOSIS — W19XXXD Unspecified fall, subsequent encounter: Secondary | ICD-10-CM | POA: Diagnosis not present

## 2014-02-26 DIAGNOSIS — M25552 Pain in left hip: Secondary | ICD-10-CM | POA: Diagnosis not present

## 2014-02-26 DIAGNOSIS — E039 Hypothyroidism, unspecified: Secondary | ICD-10-CM | POA: Diagnosis not present

## 2014-02-26 DIAGNOSIS — F341 Dysthymic disorder: Secondary | ICD-10-CM | POA: Diagnosis not present

## 2014-02-26 DIAGNOSIS — M1990 Unspecified osteoarthritis, unspecified site: Secondary | ICD-10-CM | POA: Diagnosis not present

## 2014-02-26 DIAGNOSIS — G629 Polyneuropathy, unspecified: Secondary | ICD-10-CM | POA: Diagnosis not present

## 2014-02-27 DIAGNOSIS — E669 Obesity, unspecified: Secondary | ICD-10-CM | POA: Diagnosis not present

## 2014-02-27 DIAGNOSIS — F341 Dysthymic disorder: Secondary | ICD-10-CM | POA: Diagnosis not present

## 2014-02-27 DIAGNOSIS — M25552 Pain in left hip: Secondary | ICD-10-CM | POA: Diagnosis not present

## 2014-02-27 DIAGNOSIS — W19XXXD Unspecified fall, subsequent encounter: Secondary | ICD-10-CM | POA: Diagnosis not present

## 2014-02-27 DIAGNOSIS — E780 Pure hypercholesterolemia: Secondary | ICD-10-CM | POA: Diagnosis not present

## 2014-02-27 DIAGNOSIS — G629 Polyneuropathy, unspecified: Secondary | ICD-10-CM | POA: Diagnosis not present

## 2014-02-27 DIAGNOSIS — M1990 Unspecified osteoarthritis, unspecified site: Secondary | ICD-10-CM | POA: Diagnosis not present

## 2014-02-27 DIAGNOSIS — E039 Hypothyroidism, unspecified: Secondary | ICD-10-CM | POA: Diagnosis not present

## 2014-03-02 DIAGNOSIS — E780 Pure hypercholesterolemia: Secondary | ICD-10-CM | POA: Diagnosis not present

## 2014-03-02 DIAGNOSIS — G629 Polyneuropathy, unspecified: Secondary | ICD-10-CM | POA: Diagnosis not present

## 2014-03-02 DIAGNOSIS — W19XXXD Unspecified fall, subsequent encounter: Secondary | ICD-10-CM | POA: Diagnosis not present

## 2014-03-02 DIAGNOSIS — M25552 Pain in left hip: Secondary | ICD-10-CM | POA: Diagnosis not present

## 2014-03-02 DIAGNOSIS — M1990 Unspecified osteoarthritis, unspecified site: Secondary | ICD-10-CM | POA: Diagnosis not present

## 2014-03-02 DIAGNOSIS — E039 Hypothyroidism, unspecified: Secondary | ICD-10-CM | POA: Diagnosis not present

## 2014-03-02 DIAGNOSIS — F341 Dysthymic disorder: Secondary | ICD-10-CM | POA: Diagnosis not present

## 2014-03-02 DIAGNOSIS — E669 Obesity, unspecified: Secondary | ICD-10-CM | POA: Diagnosis not present

## 2014-03-05 DIAGNOSIS — E669 Obesity, unspecified: Secondary | ICD-10-CM | POA: Diagnosis not present

## 2014-03-05 DIAGNOSIS — E780 Pure hypercholesterolemia: Secondary | ICD-10-CM | POA: Diagnosis not present

## 2014-03-05 DIAGNOSIS — G629 Polyneuropathy, unspecified: Secondary | ICD-10-CM | POA: Diagnosis not present

## 2014-03-05 DIAGNOSIS — M25552 Pain in left hip: Secondary | ICD-10-CM | POA: Diagnosis not present

## 2014-03-05 DIAGNOSIS — M1990 Unspecified osteoarthritis, unspecified site: Secondary | ICD-10-CM | POA: Diagnosis not present

## 2014-03-05 DIAGNOSIS — E039 Hypothyroidism, unspecified: Secondary | ICD-10-CM | POA: Diagnosis not present

## 2014-03-05 DIAGNOSIS — W19XXXD Unspecified fall, subsequent encounter: Secondary | ICD-10-CM | POA: Diagnosis not present

## 2014-03-05 DIAGNOSIS — F341 Dysthymic disorder: Secondary | ICD-10-CM | POA: Diagnosis not present

## 2014-03-06 DIAGNOSIS — E780 Pure hypercholesterolemia: Secondary | ICD-10-CM | POA: Diagnosis not present

## 2014-03-06 DIAGNOSIS — E669 Obesity, unspecified: Secondary | ICD-10-CM | POA: Diagnosis not present

## 2014-03-06 DIAGNOSIS — F341 Dysthymic disorder: Secondary | ICD-10-CM | POA: Diagnosis not present

## 2014-03-06 DIAGNOSIS — G629 Polyneuropathy, unspecified: Secondary | ICD-10-CM | POA: Diagnosis not present

## 2014-03-06 DIAGNOSIS — M1990 Unspecified osteoarthritis, unspecified site: Secondary | ICD-10-CM | POA: Diagnosis not present

## 2014-03-06 DIAGNOSIS — W19XXXD Unspecified fall, subsequent encounter: Secondary | ICD-10-CM | POA: Diagnosis not present

## 2014-03-06 DIAGNOSIS — E039 Hypothyroidism, unspecified: Secondary | ICD-10-CM | POA: Diagnosis not present

## 2014-03-06 DIAGNOSIS — M25552 Pain in left hip: Secondary | ICD-10-CM | POA: Diagnosis not present

## 2014-03-07 DIAGNOSIS — E039 Hypothyroidism, unspecified: Secondary | ICD-10-CM | POA: Diagnosis not present

## 2014-03-07 DIAGNOSIS — M25552 Pain in left hip: Secondary | ICD-10-CM | POA: Diagnosis not present

## 2014-03-07 DIAGNOSIS — W19XXXD Unspecified fall, subsequent encounter: Secondary | ICD-10-CM | POA: Diagnosis not present

## 2014-03-07 DIAGNOSIS — E780 Pure hypercholesterolemia: Secondary | ICD-10-CM | POA: Diagnosis not present

## 2014-03-07 DIAGNOSIS — G629 Polyneuropathy, unspecified: Secondary | ICD-10-CM | POA: Diagnosis not present

## 2014-03-07 DIAGNOSIS — E669 Obesity, unspecified: Secondary | ICD-10-CM | POA: Diagnosis not present

## 2014-03-07 DIAGNOSIS — F341 Dysthymic disorder: Secondary | ICD-10-CM | POA: Diagnosis not present

## 2014-03-07 DIAGNOSIS — M1990 Unspecified osteoarthritis, unspecified site: Secondary | ICD-10-CM | POA: Diagnosis not present

## 2014-03-08 DIAGNOSIS — G629 Polyneuropathy, unspecified: Secondary | ICD-10-CM | POA: Diagnosis not present

## 2014-03-08 DIAGNOSIS — W19XXXD Unspecified fall, subsequent encounter: Secondary | ICD-10-CM | POA: Diagnosis not present

## 2014-03-08 DIAGNOSIS — E039 Hypothyroidism, unspecified: Secondary | ICD-10-CM | POA: Diagnosis not present

## 2014-03-08 DIAGNOSIS — M1990 Unspecified osteoarthritis, unspecified site: Secondary | ICD-10-CM | POA: Diagnosis not present

## 2014-03-08 DIAGNOSIS — E780 Pure hypercholesterolemia: Secondary | ICD-10-CM | POA: Diagnosis not present

## 2014-03-08 DIAGNOSIS — F341 Dysthymic disorder: Secondary | ICD-10-CM | POA: Diagnosis not present

## 2014-03-08 DIAGNOSIS — E669 Obesity, unspecified: Secondary | ICD-10-CM | POA: Diagnosis not present

## 2014-03-08 DIAGNOSIS — M25552 Pain in left hip: Secondary | ICD-10-CM | POA: Diagnosis not present

## 2014-03-11 DIAGNOSIS — E669 Obesity, unspecified: Secondary | ICD-10-CM | POA: Diagnosis not present

## 2014-03-11 DIAGNOSIS — W19XXXD Unspecified fall, subsequent encounter: Secondary | ICD-10-CM | POA: Diagnosis not present

## 2014-03-11 DIAGNOSIS — F341 Dysthymic disorder: Secondary | ICD-10-CM | POA: Diagnosis not present

## 2014-03-11 DIAGNOSIS — E780 Pure hypercholesterolemia: Secondary | ICD-10-CM | POA: Diagnosis not present

## 2014-03-11 DIAGNOSIS — G629 Polyneuropathy, unspecified: Secondary | ICD-10-CM | POA: Diagnosis not present

## 2014-03-11 DIAGNOSIS — E039 Hypothyroidism, unspecified: Secondary | ICD-10-CM | POA: Diagnosis not present

## 2014-03-11 DIAGNOSIS — M25552 Pain in left hip: Secondary | ICD-10-CM | POA: Diagnosis not present

## 2014-03-11 DIAGNOSIS — M1990 Unspecified osteoarthritis, unspecified site: Secondary | ICD-10-CM | POA: Diagnosis not present

## 2014-03-14 DIAGNOSIS — W19XXXD Unspecified fall, subsequent encounter: Secondary | ICD-10-CM | POA: Diagnosis not present

## 2014-03-14 DIAGNOSIS — E669 Obesity, unspecified: Secondary | ICD-10-CM | POA: Diagnosis not present

## 2014-03-14 DIAGNOSIS — F341 Dysthymic disorder: Secondary | ICD-10-CM | POA: Diagnosis not present

## 2014-03-14 DIAGNOSIS — E780 Pure hypercholesterolemia: Secondary | ICD-10-CM | POA: Diagnosis not present

## 2014-03-14 DIAGNOSIS — E039 Hypothyroidism, unspecified: Secondary | ICD-10-CM | POA: Diagnosis not present

## 2014-03-14 DIAGNOSIS — G629 Polyneuropathy, unspecified: Secondary | ICD-10-CM | POA: Diagnosis not present

## 2014-03-14 DIAGNOSIS — M1990 Unspecified osteoarthritis, unspecified site: Secondary | ICD-10-CM | POA: Diagnosis not present

## 2014-03-14 DIAGNOSIS — M25552 Pain in left hip: Secondary | ICD-10-CM | POA: Diagnosis not present

## 2014-03-15 DIAGNOSIS — W19XXXD Unspecified fall, subsequent encounter: Secondary | ICD-10-CM | POA: Diagnosis not present

## 2014-03-15 DIAGNOSIS — E669 Obesity, unspecified: Secondary | ICD-10-CM | POA: Diagnosis not present

## 2014-03-15 DIAGNOSIS — M25552 Pain in left hip: Secondary | ICD-10-CM | POA: Diagnosis not present

## 2014-03-15 DIAGNOSIS — G629 Polyneuropathy, unspecified: Secondary | ICD-10-CM | POA: Diagnosis not present

## 2014-03-15 DIAGNOSIS — E780 Pure hypercholesterolemia: Secondary | ICD-10-CM | POA: Diagnosis not present

## 2014-03-15 DIAGNOSIS — M1990 Unspecified osteoarthritis, unspecified site: Secondary | ICD-10-CM | POA: Diagnosis not present

## 2014-03-15 DIAGNOSIS — E039 Hypothyroidism, unspecified: Secondary | ICD-10-CM | POA: Diagnosis not present

## 2014-03-15 DIAGNOSIS — F341 Dysthymic disorder: Secondary | ICD-10-CM | POA: Diagnosis not present

## 2014-03-18 DIAGNOSIS — M25552 Pain in left hip: Secondary | ICD-10-CM | POA: Diagnosis not present

## 2014-03-18 DIAGNOSIS — E039 Hypothyroidism, unspecified: Secondary | ICD-10-CM | POA: Diagnosis not present

## 2014-03-18 DIAGNOSIS — F341 Dysthymic disorder: Secondary | ICD-10-CM | POA: Diagnosis not present

## 2014-03-18 DIAGNOSIS — W19XXXD Unspecified fall, subsequent encounter: Secondary | ICD-10-CM | POA: Diagnosis not present

## 2014-03-18 DIAGNOSIS — E780 Pure hypercholesterolemia: Secondary | ICD-10-CM | POA: Diagnosis not present

## 2014-03-18 DIAGNOSIS — G629 Polyneuropathy, unspecified: Secondary | ICD-10-CM | POA: Diagnosis not present

## 2014-03-18 DIAGNOSIS — M1990 Unspecified osteoarthritis, unspecified site: Secondary | ICD-10-CM | POA: Diagnosis not present

## 2014-03-18 DIAGNOSIS — E669 Obesity, unspecified: Secondary | ICD-10-CM | POA: Diagnosis not present

## 2014-03-19 DIAGNOSIS — E780 Pure hypercholesterolemia: Secondary | ICD-10-CM | POA: Diagnosis not present

## 2014-03-19 DIAGNOSIS — M1990 Unspecified osteoarthritis, unspecified site: Secondary | ICD-10-CM | POA: Diagnosis not present

## 2014-03-19 DIAGNOSIS — E039 Hypothyroidism, unspecified: Secondary | ICD-10-CM | POA: Diagnosis not present

## 2014-03-19 DIAGNOSIS — W19XXXD Unspecified fall, subsequent encounter: Secondary | ICD-10-CM | POA: Diagnosis not present

## 2014-03-19 DIAGNOSIS — F341 Dysthymic disorder: Secondary | ICD-10-CM | POA: Diagnosis not present

## 2014-03-19 DIAGNOSIS — G629 Polyneuropathy, unspecified: Secondary | ICD-10-CM | POA: Diagnosis not present

## 2014-03-19 DIAGNOSIS — E669 Obesity, unspecified: Secondary | ICD-10-CM | POA: Diagnosis not present

## 2014-03-19 DIAGNOSIS — M25552 Pain in left hip: Secondary | ICD-10-CM | POA: Diagnosis not present

## 2014-03-20 DIAGNOSIS — E780 Pure hypercholesterolemia: Secondary | ICD-10-CM | POA: Diagnosis not present

## 2014-03-20 DIAGNOSIS — F341 Dysthymic disorder: Secondary | ICD-10-CM | POA: Diagnosis not present

## 2014-03-20 DIAGNOSIS — G629 Polyneuropathy, unspecified: Secondary | ICD-10-CM | POA: Diagnosis not present

## 2014-03-20 DIAGNOSIS — E039 Hypothyroidism, unspecified: Secondary | ICD-10-CM | POA: Diagnosis not present

## 2014-03-20 DIAGNOSIS — M1990 Unspecified osteoarthritis, unspecified site: Secondary | ICD-10-CM | POA: Diagnosis not present

## 2014-03-20 DIAGNOSIS — W19XXXD Unspecified fall, subsequent encounter: Secondary | ICD-10-CM | POA: Diagnosis not present

## 2014-03-20 DIAGNOSIS — M25552 Pain in left hip: Secondary | ICD-10-CM | POA: Diagnosis not present

## 2014-03-20 DIAGNOSIS — E669 Obesity, unspecified: Secondary | ICD-10-CM | POA: Diagnosis not present

## 2014-03-22 DIAGNOSIS — M1990 Unspecified osteoarthritis, unspecified site: Secondary | ICD-10-CM | POA: Diagnosis not present

## 2014-03-22 DIAGNOSIS — F341 Dysthymic disorder: Secondary | ICD-10-CM | POA: Diagnosis not present

## 2014-03-22 DIAGNOSIS — E039 Hypothyroidism, unspecified: Secondary | ICD-10-CM | POA: Diagnosis not present

## 2014-03-22 DIAGNOSIS — W19XXXD Unspecified fall, subsequent encounter: Secondary | ICD-10-CM | POA: Diagnosis not present

## 2014-03-22 DIAGNOSIS — M25552 Pain in left hip: Secondary | ICD-10-CM | POA: Diagnosis not present

## 2014-03-22 DIAGNOSIS — G629 Polyneuropathy, unspecified: Secondary | ICD-10-CM | POA: Diagnosis not present

## 2014-03-22 DIAGNOSIS — E780 Pure hypercholesterolemia: Secondary | ICD-10-CM | POA: Diagnosis not present

## 2014-03-22 DIAGNOSIS — E669 Obesity, unspecified: Secondary | ICD-10-CM | POA: Diagnosis not present

## 2014-03-28 DIAGNOSIS — E669 Obesity, unspecified: Secondary | ICD-10-CM | POA: Diagnosis not present

## 2014-03-28 DIAGNOSIS — M1990 Unspecified osteoarthritis, unspecified site: Secondary | ICD-10-CM | POA: Diagnosis not present

## 2014-03-28 DIAGNOSIS — M25552 Pain in left hip: Secondary | ICD-10-CM | POA: Diagnosis not present

## 2014-03-28 DIAGNOSIS — E039 Hypothyroidism, unspecified: Secondary | ICD-10-CM | POA: Diagnosis not present

## 2014-03-28 DIAGNOSIS — F341 Dysthymic disorder: Secondary | ICD-10-CM | POA: Diagnosis not present

## 2014-03-28 DIAGNOSIS — W19XXXD Unspecified fall, subsequent encounter: Secondary | ICD-10-CM | POA: Diagnosis not present

## 2014-03-28 DIAGNOSIS — E780 Pure hypercholesterolemia: Secondary | ICD-10-CM | POA: Diagnosis not present

## 2014-03-28 DIAGNOSIS — G629 Polyneuropathy, unspecified: Secondary | ICD-10-CM | POA: Diagnosis not present

## 2014-04-01 ENCOUNTER — Ambulatory Visit (INDEPENDENT_AMBULATORY_CARE_PROVIDER_SITE_OTHER): Payer: Commercial Managed Care - HMO | Admitting: Family Medicine

## 2014-04-01 ENCOUNTER — Encounter: Payer: Self-pay | Admitting: Family Medicine

## 2014-04-01 VITALS — BP 133/50 | Ht 64.0 in | Wt 185.0 lb

## 2014-04-01 DIAGNOSIS — M899 Disorder of bone, unspecified: Secondary | ICD-10-CM | POA: Diagnosis not present

## 2014-04-01 DIAGNOSIS — C50912 Malignant neoplasm of unspecified site of left female breast: Secondary | ICD-10-CM

## 2014-04-01 DIAGNOSIS — G8929 Other chronic pain: Secondary | ICD-10-CM | POA: Diagnosis not present

## 2014-04-01 DIAGNOSIS — M25551 Pain in right hip: Secondary | ICD-10-CM | POA: Diagnosis not present

## 2014-04-01 DIAGNOSIS — M898X5 Other specified disorders of bone, thigh: Secondary | ICD-10-CM

## 2014-04-01 MED ORDER — TRAMADOL HCL 50 MG PO TABS: 50.0000 mg | ORAL_TABLET | Freq: Four times a day (QID) | ORAL | Status: DC | PRN

## 2014-04-01 MED ORDER — MELOXICAM 15 MG PO TABS: ORAL_TABLET | ORAL | Status: DC

## 2014-04-02 ENCOUNTER — Encounter: Payer: Self-pay | Admitting: Family Medicine

## 2014-04-02 DIAGNOSIS — E782 Mixed hyperlipidemia: Secondary | ICD-10-CM | POA: Insufficient documentation

## 2014-04-02 DIAGNOSIS — M949 Disorder of cartilage, unspecified: Secondary | ICD-10-CM

## 2014-04-02 DIAGNOSIS — E669 Obesity, unspecified: Secondary | ICD-10-CM | POA: Diagnosis not present

## 2014-04-02 DIAGNOSIS — E780 Pure hypercholesterolemia: Secondary | ICD-10-CM | POA: Diagnosis not present

## 2014-04-02 DIAGNOSIS — C50919 Malignant neoplasm of unspecified site of unspecified female breast: Secondary | ICD-10-CM | POA: Insufficient documentation

## 2014-04-02 DIAGNOSIS — I10 Essential (primary) hypertension: Secondary | ICD-10-CM | POA: Insufficient documentation

## 2014-04-02 DIAGNOSIS — M25551 Pain in right hip: Secondary | ICD-10-CM

## 2014-04-02 DIAGNOSIS — W19XXXD Unspecified fall, subsequent encounter: Secondary | ICD-10-CM | POA: Diagnosis not present

## 2014-04-02 DIAGNOSIS — M1990 Unspecified osteoarthritis, unspecified site: Secondary | ICD-10-CM | POA: Diagnosis not present

## 2014-04-02 DIAGNOSIS — F341 Dysthymic disorder: Secondary | ICD-10-CM | POA: Diagnosis not present

## 2014-04-02 DIAGNOSIS — G629 Polyneuropathy, unspecified: Secondary | ICD-10-CM | POA: Diagnosis not present

## 2014-04-02 DIAGNOSIS — M899 Disorder of bone, unspecified: Secondary | ICD-10-CM | POA: Insufficient documentation

## 2014-04-02 DIAGNOSIS — E039 Hypothyroidism, unspecified: Secondary | ICD-10-CM

## 2014-04-02 DIAGNOSIS — M25552 Pain in left hip: Secondary | ICD-10-CM | POA: Diagnosis not present

## 2014-04-02 DIAGNOSIS — G8929 Other chronic pain: Secondary | ICD-10-CM | POA: Insufficient documentation

## 2014-04-02 DIAGNOSIS — E559 Vitamin D deficiency, unspecified: Secondary | ICD-10-CM | POA: Insufficient documentation

## 2014-04-02 HISTORY — DX: Hypothyroidism, unspecified: E03.9

## 2014-04-02 HISTORY — DX: Essential (primary) hypertension: I10

## 2014-04-02 NOTE — Progress Notes (Signed)
Patient ID: Alexandra [name], female   DOB: 1927-01-29, 81 y.o.   MRN: [id number]  Alexandra [name] - 81 y.o. female MRN [id number]  Date of birth: 1928-01-04    SUBJECTIVE:           Right hip pain. For about the last 6 month she has had some right hip pain that extends down to the right lateral mid thigh area. She's had several falls over the last couple of months most recently was hospitalized overnight on February 2 through the third for a fall during the night that caused left hip pain. During that hospital they should she had a CT of the pelvis it was negative for fracture. She is currently undergoing physical therapy for the left hip. It seems to be improving some.  The right hip started bothering her for to 6 months ago and she thinks it started about a week after she had  different fall. The right hip pain has been continuous area bothers her at night. It bothers her during the day. She has been using over-the-counter Aleve taking 2 tablets twice daily for many of her arthritic pains in this only helps a small amount. Her primary care provider referred her here today to see if there were some exercises that she needed to do perhaps involve the physical therapist in therapy for the right hip as well.  . ROS:     She reports no unusual weight gain or loss. She's had no incontinence of bowel or bladder that is unusual for her. She has had multiple falls, unknown etiology. She's noted no swelling erythema or warmth of the right or left hip, neither knee or ankle. She denies fever, sweats, chills.  PERTINENT  PMH / PSH FH / / SH:  Past Medical, Surgical, Social, and Family History Reviewed & Updated in the EMR.  Pertinent findings include:  Reviewing her chart there is a history of breast cancer history of a lump but doing on the left.. She also has history of hypertension, hypothyroidism, anxiety hyperlipidemia, osteopenia and hypovitaminosis D. Lots of recent stressors, her husband has just  entered Hospice. She wants is been as much time with him as possible and is quite anxious about being way from the house for any length of time.  She is here today with her daughter-in-law. OBJECTIVE: BP 133/50 mmHg  Ht 5\' 4"  (1.626 m)  Wt 185 lb (83.915 kg)  BMI 31.74 kg/m2  Physical Exam:  Vital signs are reviewed. GEN.: Well-developed overweight female no acute distress. Looks younger than her stated years. HIPS: Symmetrical. Both hips have decreased internal and external rotation that's actually pretty symmetrical. She has no pain with range of motion of either hip. If I placed the right hip in FABER she has slight increase in pain. Axial loading of the right hip does not increase her pain. She has normal hip flexor and extensor muscles. She walks with an antalgic gait and a slightly stooped forward posture at the hips. There is tenderness to palpation along the lateral right thigh that starts about 12-14 cm below the right iliac crest and extends for another 10 cm. It is diffuse. I can reproduce her pain by deep palpation here however. It is lower and more anterior than I would expect for the area of the greater trochanteric bursa.  SKIN: Skin around the area of the right hip is without any sign of lesion, no rash, no warmth, no erythema or ecchymoses. VASCULAR: Normal capillary refill bilateral  feet. She has dorsalis pedis pulses that are 1-2+ bilaterally equal. NEURO: Intact sensation to soft touch throughout bilateral lower extremity including feet.  ULTRASOUND: I will not charge her for this as we were unable to see much of the hip joint secondary to habitus. Limited amount I could see in the anterior approach did not show a large amount of effusion. I also did not see the greater trochanteric bursa that appeared to be significant. There was an area of musculature on the lateral thigh that appeared to be slightly edematous but again this fall exam was so limited I don't think this would be  diagnostic in any way.   IMAGING: CT pelvisfebruary second, 2016. I reviewed the images. The report:There is no evidence of hip fracture or dislocation. No evidence of pelvic ring fracture or diastasis.  Prominent enthesophytes around the greater trochanters and anterior iliac bones. No asymmetric or notable hip joint narrowing. No focal bone lesion or osseous erosion.  There is lower lumbar facet arthropathy with overgrowth. The patient is status post L4-5 laminectomy ASSESSMENT & PLAN:  See problem based charting & AVS for pt instructions.

## 2014-04-02 NOTE — Assessment & Plan Note (Signed)
Atypical hip pain. Does not seem to be coming from the hip joint itself. Vitals: Be a greater trochanteric bursitis but I don't see anything at all on ultrasound although it was not the best studied. She's been taking over-the-counter Aleve at prescription strength dose and is not having a lot of success with that. We'll try switching her for the next couple of weeks from that to meloxicam 15 mg a day. Her creatinine is currently 1.25. If she's going to stay on the meloxicam then I think we would need to have her PCP follow her creatinine. We discussed potential stomach upset, gastric bleeding etc. She's cautioned not to take any over-the-counter NSAIDs I'll taking the meloxicam. She also has some tramadol at home does not have much of a less so will refill that. Discussed caution given her history of falls.  Regarding the hip and what is more likely lateral femur pain, I would just like to get an x-ray. She does have remote history of breast cancer. The CT of her pelvis they did last month to evaluate her left hip did not really extend down the length of the femur.   I think ultimately the only way to really know what's going on (unkless there is something unexpected on plain films of hip and femur) would be to get MRI. Given the constraints with the illness of her husband, she doesn't want to get MRI right now. If she changes her mind she'll let us know. When she gets the x-rays, I'll be in contact with her via phone. We would certainly be willing to see her back at any time should her symptoms progress or change.

## 2014-04-03 DIAGNOSIS — M8580 Other specified disorders of bone density and structure, unspecified site: Secondary | ICD-10-CM | POA: Diagnosis not present

## 2014-04-03 DIAGNOSIS — Z0001 Encounter for general adult medical examination with abnormal findings: Secondary | ICD-10-CM | POA: Diagnosis not present

## 2014-04-03 DIAGNOSIS — M25552 Pain in left hip: Secondary | ICD-10-CM | POA: Diagnosis not present

## 2014-04-03 DIAGNOSIS — E785 Hyperlipidemia, unspecified: Secondary | ICD-10-CM | POA: Diagnosis not present

## 2014-04-03 DIAGNOSIS — W19XXXD Unspecified fall, subsequent encounter: Secondary | ICD-10-CM | POA: Diagnosis not present

## 2014-04-03 DIAGNOSIS — E559 Vitamin D deficiency, unspecified: Secondary | ICD-10-CM | POA: Diagnosis not present

## 2014-04-03 DIAGNOSIS — R5383 Other fatigue: Secondary | ICD-10-CM | POA: Diagnosis not present

## 2014-04-03 DIAGNOSIS — F341 Dysthymic disorder: Secondary | ICD-10-CM | POA: Diagnosis not present

## 2014-04-03 DIAGNOSIS — M1990 Unspecified osteoarthritis, unspecified site: Secondary | ICD-10-CM | POA: Diagnosis not present

## 2014-04-03 DIAGNOSIS — I10 Essential (primary) hypertension: Secondary | ICD-10-CM | POA: Diagnosis not present

## 2014-04-03 DIAGNOSIS — E780 Pure hypercholesterolemia: Secondary | ICD-10-CM | POA: Diagnosis not present

## 2014-04-03 DIAGNOSIS — E669 Obesity, unspecified: Secondary | ICD-10-CM | POA: Diagnosis not present

## 2014-04-03 DIAGNOSIS — G629 Polyneuropathy, unspecified: Secondary | ICD-10-CM | POA: Diagnosis not present

## 2014-04-03 DIAGNOSIS — E039 Hypothyroidism, unspecified: Secondary | ICD-10-CM | POA: Diagnosis not present

## 2014-04-11 DIAGNOSIS — E039 Hypothyroidism, unspecified: Secondary | ICD-10-CM | POA: Diagnosis not present

## 2014-04-11 DIAGNOSIS — G629 Polyneuropathy, unspecified: Secondary | ICD-10-CM | POA: Diagnosis not present

## 2014-04-11 DIAGNOSIS — M25552 Pain in left hip: Secondary | ICD-10-CM | POA: Diagnosis not present

## 2014-04-11 DIAGNOSIS — F341 Dysthymic disorder: Secondary | ICD-10-CM | POA: Diagnosis not present

## 2014-04-11 DIAGNOSIS — M1990 Unspecified osteoarthritis, unspecified site: Secondary | ICD-10-CM | POA: Diagnosis not present

## 2014-04-11 DIAGNOSIS — E669 Obesity, unspecified: Secondary | ICD-10-CM | POA: Diagnosis not present

## 2014-04-11 DIAGNOSIS — W19XXXD Unspecified fall, subsequent encounter: Secondary | ICD-10-CM | POA: Diagnosis not present

## 2014-04-11 DIAGNOSIS — E780 Pure hypercholesterolemia: Secondary | ICD-10-CM | POA: Diagnosis not present

## 2014-04-16 DIAGNOSIS — E039 Hypothyroidism, unspecified: Secondary | ICD-10-CM | POA: Diagnosis not present

## 2014-04-16 DIAGNOSIS — W19XXXD Unspecified fall, subsequent encounter: Secondary | ICD-10-CM | POA: Diagnosis not present

## 2014-04-16 DIAGNOSIS — F341 Dysthymic disorder: Secondary | ICD-10-CM | POA: Diagnosis not present

## 2014-04-16 DIAGNOSIS — E669 Obesity, unspecified: Secondary | ICD-10-CM | POA: Diagnosis not present

## 2014-04-16 DIAGNOSIS — M1990 Unspecified osteoarthritis, unspecified site: Secondary | ICD-10-CM | POA: Diagnosis not present

## 2014-04-16 DIAGNOSIS — G629 Polyneuropathy, unspecified: Secondary | ICD-10-CM | POA: Diagnosis not present

## 2014-04-16 DIAGNOSIS — M25552 Pain in left hip: Secondary | ICD-10-CM | POA: Diagnosis not present

## 2014-04-16 DIAGNOSIS — E780 Pure hypercholesterolemia: Secondary | ICD-10-CM | POA: Diagnosis not present

## 2014-04-17 DIAGNOSIS — E669 Obesity, unspecified: Secondary | ICD-10-CM | POA: Diagnosis not present

## 2014-04-17 DIAGNOSIS — E780 Pure hypercholesterolemia: Secondary | ICD-10-CM | POA: Diagnosis not present

## 2014-04-17 DIAGNOSIS — M25552 Pain in left hip: Secondary | ICD-10-CM | POA: Diagnosis not present

## 2014-04-17 DIAGNOSIS — F341 Dysthymic disorder: Secondary | ICD-10-CM | POA: Diagnosis not present

## 2014-04-17 DIAGNOSIS — G629 Polyneuropathy, unspecified: Secondary | ICD-10-CM | POA: Diagnosis not present

## 2014-04-17 DIAGNOSIS — W19XXXD Unspecified fall, subsequent encounter: Secondary | ICD-10-CM | POA: Diagnosis not present

## 2014-04-17 DIAGNOSIS — M1990 Unspecified osteoarthritis, unspecified site: Secondary | ICD-10-CM | POA: Diagnosis not present

## 2014-04-17 DIAGNOSIS — E039 Hypothyroidism, unspecified: Secondary | ICD-10-CM | POA: Diagnosis not present

## 2014-04-18 DIAGNOSIS — M1990 Unspecified osteoarthritis, unspecified site: Secondary | ICD-10-CM | POA: Diagnosis not present

## 2014-04-18 DIAGNOSIS — F341 Dysthymic disorder: Secondary | ICD-10-CM | POA: Diagnosis not present

## 2014-04-18 DIAGNOSIS — W19XXXD Unspecified fall, subsequent encounter: Secondary | ICD-10-CM | POA: Diagnosis not present

## 2014-04-18 DIAGNOSIS — E039 Hypothyroidism, unspecified: Secondary | ICD-10-CM | POA: Diagnosis not present

## 2014-04-18 DIAGNOSIS — M25552 Pain in left hip: Secondary | ICD-10-CM | POA: Diagnosis not present

## 2014-04-18 DIAGNOSIS — E669 Obesity, unspecified: Secondary | ICD-10-CM | POA: Diagnosis not present

## 2014-04-18 DIAGNOSIS — E780 Pure hypercholesterolemia: Secondary | ICD-10-CM | POA: Diagnosis not present

## 2014-04-18 DIAGNOSIS — G629 Polyneuropathy, unspecified: Secondary | ICD-10-CM | POA: Diagnosis not present

## 2014-04-25 ENCOUNTER — Other Ambulatory Visit: Payer: Self-pay | Admitting: *Deleted

## 2014-04-25 DIAGNOSIS — G8929 Other chronic pain: Secondary | ICD-10-CM

## 2014-04-25 DIAGNOSIS — M25551 Pain in right hip: Principal | ICD-10-CM

## 2014-05-02 ENCOUNTER — Ambulatory Visit: Payer: Medicare HMO | Admitting: Sports Medicine

## 2014-05-17 ENCOUNTER — Ambulatory Visit
Admission: RE | Admit: 2014-05-17 | Discharge: 2014-05-17 | Disposition: A | Discharge status: Home / Self Care | Payer: Medicare HMO | Source: Ambulatory Visit | Attending: Family Medicine | Admitting: Family Medicine

## 2014-05-17 ENCOUNTER — Encounter: Payer: Self-pay | Admitting: Family Medicine

## 2014-05-17 DIAGNOSIS — Z87828 Personal history of other (healed) physical injury and trauma: Secondary | ICD-10-CM | POA: Diagnosis not present

## 2014-05-17 DIAGNOSIS — M160 Bilateral primary osteoarthritis of hip: Secondary | ICD-10-CM | POA: Diagnosis not present

## 2014-05-17 DIAGNOSIS — M1711 Unilateral primary osteoarthritis, right knee: Secondary | ICD-10-CM | POA: Diagnosis not present

## 2014-05-17 DIAGNOSIS — M898X5 Other specified disorders of bone, thigh: Secondary | ICD-10-CM

## 2014-05-17 DIAGNOSIS — M25551 Pain in right hip: Secondary | ICD-10-CM

## 2014-05-17 DIAGNOSIS — M1611 Unilateral primary osteoarthritis, right hip: Secondary | ICD-10-CM | POA: Diagnosis not present

## 2014-05-17 DIAGNOSIS — G8929 Other chronic pain: Secondary | ICD-10-CM

## 2014-05-31 ENCOUNTER — Telehealth: Payer: Self-pay | Admitting: Family Medicine

## 2014-06-11 ENCOUNTER — Other Ambulatory Visit: Payer: Self-pay

## 2014-06-11 DIAGNOSIS — Z1231 Encounter for screening mammogram for malignant neoplasm of breast: Secondary | ICD-10-CM

## 2014-07-01 ENCOUNTER — Ambulatory Visit (INDEPENDENT_AMBULATORY_CARE_PROVIDER_SITE_OTHER): Payer: Commercial Managed Care - HMO | Admitting: Family Medicine

## 2014-07-01 ENCOUNTER — Encounter: Payer: Self-pay | Admitting: Family Medicine

## 2014-07-01 VITALS — BP 122/54 | Ht 64.0 in | Wt 193.4 lb

## 2014-07-01 DIAGNOSIS — G609 Hereditary and idiopathic neuropathy, unspecified: Secondary | ICD-10-CM

## 2014-07-01 NOTE — Patient Instructions (Signed)
The numbness you are experiencing in your lower legs sounds a lot like a peripheral neuropathy. I frequently use  Medicines like gabapentin or lyric for that but that is on your allergy list. I would recommend you ask your primary care provider (Dr. Harrington Challenger) about a possible referral to Dr Sarina Ill at [facility]. I have sent several patients to her and have been pleased. She has a special interest in peripheral neuropathy.

## 2014-07-02 ENCOUNTER — Encounter: Payer: Self-pay | Admitting: Family Medicine

## 2014-07-02 DIAGNOSIS — G609 Hereditary and idiopathic neuropathy, unspecified: Secondary | ICD-10-CM | POA: Insufficient documentation

## 2014-07-02 HISTORY — DX: Hereditary and idiopathic neuropathy, unspecified: G60.9

## 2014-07-02 NOTE — Assessment & Plan Note (Signed)
What she is describing sounds like peripheral neuropathy. Her exam does not help me understand her issues. I don't think this is something I can further evaluate in her sports medicine clinic. I will send her back to her PCP. Perhaps they will want to complete the workup there, alternatively they will may want to see a neurologist. I don't have all her lab work available to me in the chart but she tells me her thyroid has been recently checked. I will leave it to her PCP to make further decisions. She asked for specific recommendations for a neurologist---I did tell her Dr Jaynee Eagles at Neurology has seen a few patients of mine just so she would have a name in case she decided to see a neurologist.

## 2014-07-02 NOTE — Progress Notes (Signed)
Patient ID: Alexandra [name], female   DOB: 1927/12/09, 81 y.o.   MRN: [id number]  Alexandra [name] - 81 y.o. female MRN [id number]  Date of birth: December 26, 1927    SUBJECTIVE:   She is here today with her daughter   Here with complain of bilateral lower extremity numbness that has worsened in the last few months. I saw her about 3 months ago for some right hip and femur pain. She says this is pretty well controlled right now. She reports numbness from mid calf down including both feet. Unclear exactly how long this is been going on, "a few months". Not having any numbness in her hands. The lower extremity numbness is always present, does not awaken her from sleep, not worse after standing, neither better nor worse with any type of walking. Her main concern is that she's going to lose her ability to walk.  Reports she's not had any numbness like this in her past history. ROS:     No unusual weight change, fever, sweats, chills. Lower extremity paresthesias and numbness as per history of present illness. She's noted no joint swelling or redness or warmth. Her chronic right hip pain is essentially unchanged. She's had no dizziness, no unusual bruising, no unusual shortness of breath, no essential change in her exercise or activity tolerance. She has had some anhedonia which she relates to her husband's recent death after a long illness.  PERTINENT  PMH / PSH FH / / SH:  Past Medical, Surgical, Social, and Family History Reviewed & Updated in the EMR.  Pertinent findings include:  History of breast cancer, says she was never treated with chemotherapy. History of anxiety Intermittent left bundle branch block Hypertension Hypothyroidism (she reports recent lab work with her PCP, I do not see that in the chart)   OBJECTIVE: BP 122/54 mmHg  Ht 5\' 4"  (1.626 m)  Wt 193 lb 6.4 oz (87.726 kg)  BMI 33.18 kg/m2  Physical Exam:  Vital signs are reviewed. GEN.: Well-developed female, no acute distress. Walking  with a cane. She is able to get up on the exam table with minimal assistance. MSK: Hip flexor and extensor strength 5 out of 5, knee extension full range of motion with some mild crepitus on extension and 5 out of 5 strength. Ankle dorsiflexion and plantar flexion strength 5 out of 5. Muscle bulk is symmetrical. Calves are soft bilaterally. NEURO: She appears to have intact sensation to soft touch with basically normal 2 point discrimination bilateral lower extremities from the knee down is similar to neuro exam of her hands. Her DTRs at the knee are difficult to elicit, at the ankle 1+ bilaterally. GAIT: Somewhat antalgic she is walking with a cane. She has a stooped posture with his shortness try length on the right and slightly wide-based her gait. She ambulates using the cane but without additional assistance. PSYCH: Alert and oriented 4. Affect is a little bit flat but interactive appropriately. Speech still is normal in content and fluency. Memory both recent and remote is intact.  ASSESSMENT & PLAN:  See problem based charting & AVS for pt instructions.

## 2014-07-15 DIAGNOSIS — R109 Unspecified abdominal pain: Secondary | ICD-10-CM | POA: Diagnosis not present

## 2014-07-19 DIAGNOSIS — R079 Chest pain, unspecified: Secondary | ICD-10-CM | POA: Diagnosis not present

## 2014-07-19 DIAGNOSIS — J4520 Mild intermittent asthma, uncomplicated: Secondary | ICD-10-CM | POA: Diagnosis not present

## 2014-07-19 DIAGNOSIS — F430 Acute stress reaction: Secondary | ICD-10-CM | POA: Diagnosis not present

## 2014-08-05 ENCOUNTER — Ambulatory Visit (INDEPENDENT_AMBULATORY_CARE_PROVIDER_SITE_OTHER): Payer: Commercial Managed Care - HMO | Admitting: Neurology

## 2014-08-05 ENCOUNTER — Encounter: Payer: Self-pay | Admitting: Neurology

## 2014-08-05 VITALS — BP 130/77 | HR 102 | Ht 64.0 in | Wt 197.6 lb

## 2014-08-05 DIAGNOSIS — G609 Hereditary and idiopathic neuropathy, unspecified: Secondary | ICD-10-CM

## 2014-08-05 DIAGNOSIS — G729 Myopathy, unspecified: Secondary | ICD-10-CM

## 2014-08-05 DIAGNOSIS — E538 Deficiency of other specified B group vitamins: Secondary | ICD-10-CM

## 2014-08-05 NOTE — Patient Instructions (Signed)
Remember to drink plenty of fluid, eat healthy meals and do not skip any meals. Try to eat protein with a every meal and eat a healthy snack such as fruit or nuts in between meals. Try to keep a regular sleep-wake schedule and try to exercise daily, particularly in the form of walking, 20-30 minutes a day, if you can.   As far as diagnostic testing: emg/ncs  I would like to see you back in 1-2 weeks, sooner if we need to. Please call us with any interim questions, concerns, problems, updates or refill requests.   Our phone number is [phone]. We also have an after hours call service for urgent matters and there is a physician on-call for urgent questions. For any emergencies you know to call 911 or go to the nearest emergency room

## 2014-08-05 NOTE — Progress Notes (Addendum)
[facility]    Provider:  Dr Jaynee Eagles Referring Provider: Lona Kettle, MD Primary Care Physician:   Melinda Crutch, MD  CC:  Neuropathy  HPI:  Alexandra [name] is a 81 y.o. female here as a referral from Dr. Harrington Challenger for Neuropathy.  She has a past medical history of depression, anxiety, asthma, obesity, peripheral neuropathy, DOE, HTN, hypothyroidism, leg weakness, bundle branch block, breast cancer, back surgery. She has been referred for neuropathy. She reports weakness and gait difficulties.She needs cane to walk. It has been about a year or longer since the symptoms started. Her legs are primarily in the weakest. She denies numbness or tingling in the toes(she has reported leg and feet numbness in the past to other providers but today on questioning several times she denies that). She has very weak legs and feels this is her most considerable issue. She has had an operation on her back to fuse the lumbar vertebrae. She has some bulging discs. She has adifficult time getting out of low seats. She can't go up stairs. Worsening in the last 6 months. Arms are are not is weak.  She never gets tired lifting arm overhead for example, or when brushing teeth. Gets tired when picking up objects like bags from the grocery store . No neck pain. She has to use a cane now to ambulate. She fell in February. She lost her balance, she got up to go to the bathroom. She is not losing her balance frequently , just once in a while. She finds it very difficult to walk. Denies ptosis, diplopia. She is having some back pain when she is sitting too much but no radicular symptoms. She feels her symptoms are progressing. Not losing weight. She is SOB on exertion. No CP, no. Again, she denies numbness and tingling below the knees.  Reviewed notes, labs and imaging from outside physicians, which showed: MRI of the cervical spine in 12/2005:    IMPRESSION:  Multilevel disk degeneration and spondylosis with  details described above. The spurring is most prominent on the left at C3-4 C4-5 and C5-6. These findings are similar to the prior study. There is some bone marrow edema and enhancement in the inferior C3 vertebral body which is felt to be due to progression of disk degeneration since the prior study. Otherwise no significant change from 2005.     Review of Systems: Patient complains of symptoms per HPI as well as the following symptoms: Fatigue, short of breath, wheezing, snoring, constipation, joint pain, moles, numbness, weakness, difficulty swallowing, insomnia, restless legs, depression, anxiety, not enough sleep, decreased energy. Pertinent negatives per HPI. All others negative.   History   Social History  . Marital Status: Married    Spouse Name: N/A  . Number of Children: 5  . Years of Education: HS   Occupational History  . Retired    Social History Main Topics  . Smoking status: Never Smoker   . Smokeless tobacco: Never Used  . Alcohol Use: No  . Drug Use: No  . Sexual Activity: Not on file   Other Topics Concern  . Not on file   Social History Narrative   Lives at home alone.   Right-handed.   Two cups caffeine daily (coffee).    Family History  Problem Relation Age of Onset  . Pneumonia Mother 66    cause of death  . Diabetes Brother   . Alzheimer's disease Sister   . CVA Daughter     01/21/09  .  Pancreatic cancer Father   . Pneumonia Mother     Past Medical History  Diagnosis Date  . Arthritis   . Headache(784.0)   . Anxiety   . Depression   . Asthma   . CAP (community acquired pneumonia) 12/19/2012  . Intermittent LBBB (left bundle branch block) 12/21/2012  . Obesity   . Peripheral neuropathy   . Sacroiliitis, not elsewhere classified   . Osteopenia   . Hypercholesteremia   . Neuropathy   . Trigger finger     [name]  . Chest pain     NM stress test 05/2011 Normal  . DOE (dyspnea on exertion)     No SOB at rest  . Fatigue      excertional  . breast ca dx'd 2000    left  . Essential (primary) hypertension 04/02/2014  . Adult hypothyroidism 04/02/2014  . Hereditary and idiopathic peripheral neuropathy 07/02/2014  . Leg weakness     Past Surgical History  Procedure Laterality Date  . Leg surgery    . Breast lumpectomy      breast cancer  . Lumbar laminectomy    . Achilles tendon surgery    . Tonsillectomy    . Vesicovaginal fistula closure w/ tah      DC hysterectomy  . Polypectomy      No current facility-administered medications for this visit.   No current outpatient prescriptions on file.   Facility-Administered Medications Ordered in Other Visits  Medication Dose Route Frequency Provider Last Rate Last Dose  . 0.9 %  sodium chloride infusion   Intravenous Continuous Etta Quill, DO 100 mL/hr at 08/11/14 1351 100 mL/hr at 08/11/14 1351  . albuterol (PROVENTIL) (2.5 MG/3ML) 0.083% nebulizer solution 2.5 mg  2.5 mg Nebulization Q4H PRN Etta Quill, DO      . ALPRAZolam Duanne Moron) tablet 0.25 mg  0.25 mg Oral TID PRN Etta Quill, DO   0.25 mg at 08/11/14 1345  . aspirin EC tablet 81 mg  81 mg Oral Daily Etta Quill, DO   81 mg at 08/11/14 1144  . chlorhexidine (PERIDEX) 0.12 % solution 15 mL  15 mL Mouth/Throat BID Etta Quill, DO   15 mL at 08/11/14 1146  . heparin injection 5,000 Units  5,000 Units Subcutaneous 3 times per day Etta Quill, DO   5,000 Units at 08/11/14 1347  . HYDROmorphone (DILAUDID) injection 0.5 mg  0.5 mg Intravenous Q4H PRN Etta Quill, DO      . levothyroxine (SYNTHROID, LEVOTHROID) tablet 25 mcg  25 mcg Oral QAC breakfast Etta Quill, DO   25 mcg at 08/11/14 1143  . ondansetron (ZOFRAN) injection 4 mg  4 mg Intravenous Q6H PRN Dionne Milo, NP   4 mg at 08/11/14 0342  . pantoprazole (PROTONIX) injection 40 mg  40 mg Intravenous BID Janece Canterbury, MD   40 mg at 08/11/14 1745  . sucralfate (CARAFATE) 1 GM/10ML suspension 1 g  1 g Oral TID WC & HS  Janece Canterbury, MD   1 g at 08/11/14 1742    Allergies as of 08/05/2014 - Review Complete 08/05/2014  Allergen Reaction Noted  . Cymbalta [duloxetine hcl] Other (See Comments) 02/09/2013  . Lyrica [pregabalin] Other (See Comments) 02/09/2013    Vitals: BP 130/77 mmHg  Pulse 102  Ht _0  (1.626 m)  Wt 197 lb 9.6 oz (89.631 kg)  BMI 33.90 kg/m2 Last Weight:  Wt Readings from Last 1 Encounters:  08/11/14 199 lb 4.7 oz (90.4 kg)   Last Height:   Ht Readings from Last 1 Encounters:  08/11/14 _0  (1.626 m)   Physical exam: Exam: Gen: NAD, conversant, well nourised, obese, well groomed                     CV: RRR, no MRG. No Carotid Bruits. No peripheral edema, warm, nontender Eyes: Conjunctivae clear without exudates or hemorrhage  Neuro: Detailed Neurologic Exam  Speech:    Speech is normal; fluent and spontaneous with normal comprehension.  Cognition:    The patient is oriented to person, place, and time;     recent and remote memory intact;     language fluent;     normal attention, concentration,     fund of knowledge Cranial Nerves:    The pupils are equal, round, and reactive to light. The fundi are flat. Visual fields are full to finger confrontation. Extraocular movements are intact. Trigeminal sensation is intact and the muscles of mastication are normal. The face is symmetric. The palate elevates in the midline. Hearing intact. Voice is normal. Shoulder shrug is normal. The tongue has normal motion without fasciculations.   Coordination:    No dysmetria noted  Gait:    Uses a cane, good stride, not ataxic, not myopathic gait. Difficulty getting out of seat.   Motor Observation:    No asymmetry, no atrophy, and no involuntary movements noted. Tone:    Normal muscle tone.    Posture:    Slightly stooped    Strength: 4/5 IP bilaterally otherwise 5/5     Sensation: intact to LT, pp, temp. 5 seconds bilat vibration at the great toes. Intact  proprioception.   Reflex Exam:  DTR's:  absent at patellars, 1+ at ankles Toes:    The toes are downgoing bilaterally.   Clonus:    Clonus is absent.  Assessment/Plan:   Alexandra [name] is a 81 y.o. female here as a referral from Dr. Harrington Challenger for Neuropathy.  She has a past medical history of depression, anxiety, asthma, obesity, peripheral neuropathy, DOE, HTN, hypothyroidism, leg weakness, bundle branch block, breast cancer, back surgery. She has been referred for neuropathy. She reports weakness and gait difficulties.  Exam is significant for proximal lower extremity weakness. Distal sensory exam is normal.  Will request labs from Dr. Harrington Challenger to ensure no duplication. Will order a serum neuropathy panel including antibodies for MG and LEMs. Will order. an emg/ncs to evaluate for myopathy/myositis.   I have received include: CBC with differential is unremarkable, CMP significant for BUN of 29, creatinine 1.11, EGFR 47 otherwise unremarkable, B12 480, TSH 1.23, vitamin D 25 OH 42 which is normal, LDL [place], MD  [facility] [address] [place],  [address]  Phone [phone] Fax [phone]

## 2014-08-06 ENCOUNTER — Telehealth: Payer: Self-pay | Admitting: Neurology

## 2014-08-10 ENCOUNTER — Emergency Department (HOSPITAL_COMMUNITY): Payer: Commercial Managed Care - HMO

## 2014-08-10 ENCOUNTER — Inpatient Hospital Stay (HOSPITAL_COMMUNITY)
Admission: EM | Admit: 2014-08-10 | Discharge: 2014-08-17 | DRG: 417 | Disposition: A | Discharge status: Home Health | Payer: Commercial Managed Care - HMO | Attending: Internal Medicine | Admitting: Internal Medicine

## 2014-08-10 ENCOUNTER — Encounter (HOSPITAL_COMMUNITY): Payer: Self-pay | Admitting: Emergency Medicine

## 2014-08-10 DIAGNOSIS — I480 Paroxysmal atrial fibrillation: Secondary | ICD-10-CM | POA: Diagnosis not present

## 2014-08-10 DIAGNOSIS — I272 Other secondary pulmonary hypertension: Secondary | ICD-10-CM | POA: Diagnosis present

## 2014-08-10 DIAGNOSIS — K8020 Calculus of gallbladder without cholecystitis without obstruction: Secondary | ICD-10-CM | POA: Diagnosis not present

## 2014-08-10 DIAGNOSIS — I447 Left bundle-branch block, unspecified: Secondary | ICD-10-CM | POA: Diagnosis present

## 2014-08-10 DIAGNOSIS — D638 Anemia in other chronic diseases classified elsewhere: Secondary | ICD-10-CM | POA: Diagnosis present

## 2014-08-10 DIAGNOSIS — E039 Hypothyroidism, unspecified: Secondary | ICD-10-CM | POA: Diagnosis present

## 2014-08-10 DIAGNOSIS — F329 Major depressive disorder, single episode, unspecified: Secondary | ICD-10-CM | POA: Diagnosis present

## 2014-08-10 DIAGNOSIS — Z853 Personal history of malignant neoplasm of breast: Secondary | ICD-10-CM | POA: Diagnosis not present

## 2014-08-10 DIAGNOSIS — Z6836 Body mass index (BMI) 36.0-36.9, adult: Secondary | ICD-10-CM | POA: Diagnosis not present

## 2014-08-10 DIAGNOSIS — K219 Gastro-esophageal reflux disease without esophagitis: Secondary | ICD-10-CM | POA: Diagnosis present

## 2014-08-10 DIAGNOSIS — E669 Obesity, unspecified: Secondary | ICD-10-CM | POA: Diagnosis present

## 2014-08-10 DIAGNOSIS — Z888 Allergy status to other drugs, medicaments and biological substances status: Secondary | ICD-10-CM

## 2014-08-10 DIAGNOSIS — E038 Other specified hypothyroidism: Secondary | ICD-10-CM | POA: Diagnosis not present

## 2014-08-10 DIAGNOSIS — I472 Ventricular tachycardia: Secondary | ICD-10-CM | POA: Diagnosis not present

## 2014-08-10 DIAGNOSIS — I129 Hypertensive chronic kidney disease with stage 1 through stage 4 chronic kidney disease, or unspecified chronic kidney disease: Secondary | ICD-10-CM | POA: Diagnosis present

## 2014-08-10 DIAGNOSIS — Z800 Family history of malignant neoplasm of digestive organs: Secondary | ICD-10-CM | POA: Diagnosis not present

## 2014-08-10 DIAGNOSIS — R0602 Shortness of breath: Secondary | ICD-10-CM | POA: Diagnosis not present

## 2014-08-10 DIAGNOSIS — K8010 Calculus of gallbladder with chronic cholecystitis without obstruction: Secondary | ICD-10-CM | POA: Diagnosis not present

## 2014-08-10 DIAGNOSIS — M25511 Pain in right shoulder: Secondary | ICD-10-CM | POA: Diagnosis present

## 2014-08-10 DIAGNOSIS — R1013 Epigastric pain: Secondary | ICD-10-CM | POA: Diagnosis not present

## 2014-08-10 DIAGNOSIS — Z820 Family history of epilepsy and other diseases of the nervous system: Secondary | ICD-10-CM | POA: Diagnosis not present

## 2014-08-10 DIAGNOSIS — I429 Cardiomyopathy, unspecified: Secondary | ICD-10-CM | POA: Diagnosis present

## 2014-08-10 DIAGNOSIS — R1033 Periumbilical pain: Secondary | ICD-10-CM | POA: Diagnosis present

## 2014-08-10 DIAGNOSIS — Z79891 Long term (current) use of opiate analgesic: Secondary | ICD-10-CM | POA: Diagnosis not present

## 2014-08-10 DIAGNOSIS — Y95 Nosocomial condition: Secondary | ICD-10-CM | POA: Diagnosis present

## 2014-08-10 DIAGNOSIS — Z823 Family history of stroke: Secondary | ICD-10-CM

## 2014-08-10 DIAGNOSIS — I10 Essential (primary) hypertension: Secondary | ICD-10-CM | POA: Diagnosis present

## 2014-08-10 DIAGNOSIS — R110 Nausea: Secondary | ICD-10-CM | POA: Diagnosis not present

## 2014-08-10 DIAGNOSIS — I5021 Acute systolic (congestive) heart failure: Secondary | ICD-10-CM | POA: Diagnosis not present

## 2014-08-10 DIAGNOSIS — J189 Pneumonia, unspecified organism: Secondary | ICD-10-CM | POA: Diagnosis present

## 2014-08-10 DIAGNOSIS — F419 Anxiety disorder, unspecified: Secondary | ICD-10-CM | POA: Diagnosis present

## 2014-08-10 DIAGNOSIS — I471 Supraventricular tachycardia: Secondary | ICD-10-CM | POA: Diagnosis not present

## 2014-08-10 DIAGNOSIS — Z79899 Other long term (current) drug therapy: Secondary | ICD-10-CM

## 2014-08-10 DIAGNOSIS — M8580 Other specified disorders of bone density and structure, unspecified site: Secondary | ICD-10-CM | POA: Diagnosis present

## 2014-08-10 DIAGNOSIS — G609 Hereditary and idiopathic neuropathy, unspecified: Secondary | ICD-10-CM | POA: Diagnosis present

## 2014-08-10 DIAGNOSIS — E780 Pure hypercholesterolemia: Secondary | ICD-10-CM | POA: Diagnosis present

## 2014-08-10 DIAGNOSIS — R109 Unspecified abdominal pain: Secondary | ICD-10-CM

## 2014-08-10 DIAGNOSIS — K810 Acute cholecystitis: Secondary | ICD-10-CM | POA: Diagnosis present

## 2014-08-10 DIAGNOSIS — Z7982 Long term (current) use of aspirin: Secondary | ICD-10-CM | POA: Diagnosis not present

## 2014-08-10 DIAGNOSIS — N261 Atrophy of kidney (terminal): Secondary | ICD-10-CM | POA: Diagnosis not present

## 2014-08-10 DIAGNOSIS — M1990 Unspecified osteoarthritis, unspecified site: Secondary | ICD-10-CM | POA: Diagnosis present

## 2014-08-10 DIAGNOSIS — R1084 Generalized abdominal pain: Secondary | ICD-10-CM | POA: Diagnosis not present

## 2014-08-10 DIAGNOSIS — J45909 Unspecified asthma, uncomplicated: Secondary | ICD-10-CM | POA: Diagnosis present

## 2014-08-10 DIAGNOSIS — Z833 Family history of diabetes mellitus: Secondary | ICD-10-CM

## 2014-08-10 DIAGNOSIS — K8000 Calculus of gallbladder with acute cholecystitis without obstruction: Principal | ICD-10-CM | POA: Diagnosis present

## 2014-08-10 DIAGNOSIS — N183 Chronic kidney disease, stage 3 unspecified: Secondary | ICD-10-CM | POA: Diagnosis present

## 2014-08-10 DIAGNOSIS — R188 Other ascites: Secondary | ICD-10-CM | POA: Diagnosis not present

## 2014-08-10 DIAGNOSIS — R0600 Dyspnea, unspecified: Secondary | ICD-10-CM

## 2014-08-10 DIAGNOSIS — R1011 Right upper quadrant pain: Secondary | ICD-10-CM | POA: Diagnosis present

## 2014-08-10 DIAGNOSIS — R1030 Lower abdominal pain, unspecified: Secondary | ICD-10-CM | POA: Diagnosis not present

## 2014-08-10 HISTORY — DX: Anemia in other chronic diseases classified elsewhere: D63.8

## 2014-08-10 HISTORY — DX: Chronic kidney disease, stage 3 (moderate): N18.3

## 2014-08-10 LAB — CBC WITH DIFFERENTIAL/PLATELET
BASOS PCT: 0 % (ref 0–1)
Basophils Absolute: 0 10*3/uL (ref 0.0–0.1)
EOS ABS: 0.1 10*3/uL (ref 0.0–0.7)
EOS PCT: 2 % (ref 0–5)
HEMATOCRIT: 34.9 % — AB (ref 36.0–46.0)
Hemoglobin: 11.2 g/dL — ABNORMAL LOW (ref 12.0–15.0)
Lymphocytes Relative: 29 % (ref 12–46)
Lymphs Abs: 2 10*3/uL (ref 0.7–4.0)
MCH: 29.5 pg (ref 26.0–34.0)
MCHC: 32.1 g/dL (ref 30.0–36.0)
MCV: 91.8 fL (ref 78.0–100.0)
MONO ABS: 0.4 10*3/uL (ref 0.1–1.0)
MONOS PCT: 6 % (ref 3–12)
NEUTROS PCT: 63 % (ref 43–77)
Neutro Abs: 4.2 10*3/uL (ref 1.7–7.7)
Platelets: 193 10*3/uL (ref 150–400)
RBC: 3.8 MIL/uL — ABNORMAL LOW (ref 3.87–5.11)
RDW: 14.3 % (ref 11.5–15.5)
WBC: 6.7 10*3/uL (ref 4.0–10.5)

## 2014-08-10 LAB — COMPREHENSIVE METABOLIC PANEL
ALBUMIN: 4 g/dL (ref 3.5–5.0)
ALK PHOS: 46 U/L (ref 38–126)
ALT: 21 U/L (ref 14–54)
AST: 30 U/L (ref 15–41)
Anion gap: 7 (ref 5–15)
BILIRUBIN TOTAL: 0.9 mg/dL (ref 0.3–1.2)
BUN: 29 mg/dL — ABNORMAL HIGH (ref 6–20)
CHLORIDE: 107 mmol/L (ref 101–111)
CO2: 25 mmol/L (ref 22–32)
Calcium: 9.2 mg/dL (ref 8.9–10.3)
Creatinine, Ser: 1.18 mg/dL — ABNORMAL HIGH (ref 0.44–1.00)
GFR calc Af Amer: 47 mL/min — ABNORMAL LOW (ref >60)
GFR calc non Af Amer: 41 mL/min — ABNORMAL LOW (ref >60)
Glucose, Bld: 116 mg/dL — ABNORMAL HIGH (ref 65–99)
POTASSIUM: 4.6 mmol/L (ref 3.5–5.1)
Sodium: 139 mmol/L (ref 135–145)
Total Protein: 7.9 g/dL (ref 6.5–8.1)

## 2014-08-10 LAB — URINALYSIS, ROUTINE W REFLEX MICROSCOPIC
GLUCOSE, UA: NEGATIVE mg/dL
HGB URINE DIPSTICK: NEGATIVE
KETONES UR: NEGATIVE mg/dL
Leukocytes, UA: NEGATIVE
Nitrite: NEGATIVE
PROTEIN: NEGATIVE mg/dL
Specific Gravity, Urine: 1.02 (ref 1.005–1.030)
Urobilinogen, UA: 0.2 mg/dL (ref 0.0–1.0)
pH: 5 (ref 5.0–8.0)

## 2014-08-10 LAB — LIPASE, BLOOD: Lipase: 14 U/L — ABNORMAL LOW (ref 22–51)

## 2014-08-10 LAB — I-STAT TROPONIN, ED: TROPONIN I, POC: 0.05 ng/mL (ref 0.00–0.08)

## 2014-08-10 MED ORDER — ONDANSETRON HCL 4 MG/2ML IJ SOLN
4.0000 mg | Freq: Once | INTRAMUSCULAR | Status: AC
  Administered 2014-08-10: 4 mg via INTRAVENOUS
  Filled 2014-08-10: qty 2

## 2014-08-10 MED ORDER — SODIUM CHLORIDE 0.9 % IV BOLUS (SEPSIS)
1000.0000 mL | INTRAVENOUS | Status: AC
  Administered 2014-08-10: 1000 mL via INTRAVENOUS

## 2014-08-10 MED ORDER — IOHEXOL 300 MG/ML  SOLN
100.0000 mL | Freq: Once | INTRAMUSCULAR | Status: AC | PRN
  Administered 2014-08-10: 100 mL via INTRAVENOUS

## 2014-08-10 MED ORDER — HYDROMORPHONE HCL 1 MG/ML IJ SOLN
0.5000 mg | Freq: Once | INTRAMUSCULAR | Status: AC
  Administered 2014-08-10: 0.5 mg via INTRAVENOUS
  Filled 2014-08-10: qty 1

## 2014-08-10 MED ORDER — FENTANYL CITRATE (PF) 100 MCG/2ML IJ SOLN
50.0000 ug | Freq: Once | INTRAMUSCULAR | Status: AC
  Administered 2014-08-10: 50 ug via INTRAVENOUS
  Filled 2014-08-10: qty 2

## 2014-08-10 NOTE — ED Provider Notes (Signed)
CSN: [id number]     Arrival date & time 08/10/14  1835 History   First MD Initiated Contact with Patient 08/10/14 1907     Chief Complaint  Patient presents with  . Abdominal Pain  . Sore Throat     (Consider location/radiation/quality/duration/timing/severity/associated sxs/prior Treatment) Patient is a 81 y.o. female presenting with abdominal pain and pharyngitis. The history is provided by the patient.  Abdominal Pain Pain location:  Periumbilical Pain quality: aching   Pain radiates to:  Does not radiate Pain severity:  Mild Onset quality:  Gradual Duration:  2 weeks Timing:  Intermittent Progression:  Worsening Chronicity:  New Context comment:  Spontaneously Relieved by:  Nothing Worsened by:  Nothing tried Ineffective treatments:  None tried Associated symptoms: nausea and shortness of breath (w exertion)   Associated symptoms: no chest pain, no cough, no diarrhea, no dysuria, no fatigue, no fever, no hematuria and no vomiting   Sore Throat Associated symptoms include abdominal pain and shortness of breath (w exertion). Pertinent negatives include no chest pain and no headaches.    Past Medical History  Diagnosis Date  . Arthritis   . Headache(784.0)   . Anxiety   . Depression   . Asthma   . CAP (community acquired pneumonia) 12/19/2012  . Intermittent LBBB (left bundle branch block) 12/21/2012  . Obesity   . Peripheral neuropathy   . Sacroiliitis, not elsewhere classified   . Osteopenia   . Hypercholesteremia   . Neuropathy   . Trigger finger     [name]  . Chest pain     NM stress test 05/2011 Normal  . DOE (dyspnea on exertion)     No SOB at rest  . Fatigue     excertional  . breast ca dx'd 2000    left  . Essential (primary) hypertension 04/02/2014  . Adult hypothyroidism 04/02/2014  . Hereditary and idiopathic peripheral neuropathy 07/02/2014  . Leg weakness    Past Surgical History  Procedure Laterality Date  . Leg surgery    . Breast  lumpectomy      breast cancer  . Lumbar laminectomy    . Achilles tendon surgery    . Tonsillectomy    . Vesicovaginal fistula closure w/ tah      DC hysterectomy  . Polypectomy     Family History  Problem Relation Age of Onset  . Pneumonia Mother 37    cause of death  . Diabetes Brother   . Alzheimer's disease Sister   . CVA Daughter     01/21/09  . Pancreatic cancer Father   . Pneumonia Mother    History  Substance Use Topics  . Smoking status: Never Smoker   . Smokeless tobacco: Never Used  . Alcohol Use: No   OB History    No data available     Review of Systems  Constitutional: Negative for fever and fatigue.  HENT: Positive for congestion. Negative for drooling.   Eyes: Negative for pain.  Respiratory: Positive for shortness of breath (w exertion). Negative for cough.   Cardiovascular: Negative for chest pain.  Gastrointestinal: Positive for nausea and abdominal pain. Negative for vomiting and diarrhea.  Genitourinary: Negative for dysuria and hematuria.  Musculoskeletal: Negative for back pain, gait problem and neck pain.  Skin: Negative for color change.  Neurological: Negative for dizziness and headaches.  Hematological: Negative for adenopathy.  Psychiatric/Behavioral: Negative for behavioral problems.  All other systems reviewed and are negative.     Allergies  Cymbalta and Lyrica  Home Medications   Prior to Admission medications   Medication Sig Start Date End Date Taking? Authorizing Provider  alendronate (FOSAMAX) 70 MG tablet Take 70 mg by mouth every Saturday. Take with a full glass of water on an empty stomach.   Yes Historical Provider, MD  ALPRAZolam (XANAX) 0.25 MG tablet TK 1 T PO TID PRN anxiety 07/19/14  Yes Historical Provider, MD  aspirin EC 81 MG tablet Take 81 mg by mouth daily.   Yes Historical Provider, MD  b complex vitamins tablet Take 1 tablet by mouth daily.   Yes Historical Provider, MD  Calcium-Vitamin D (CALTRATE 600 PLUS-VIT  D PO) Take 1 tablet by mouth 2 (two) times daily.   Yes Historical Provider, MD  cholecalciferol (VITAMIN D) 1000 UNITS tablet Take 1,000 Units by mouth daily.   Yes Historical Provider, MD  levothyroxine (SYNTHROID, LEVOTHROID) 25 MCG tablet Take 25 mcg by mouth daily before breakfast.   Yes Historical Provider, MD  Multiple Vitamin (MULITIVITAMIN WITH MINERALS) TABS Take 1 tablet by mouth daily.   Yes Historical Provider, MD  naproxen sodium (ANAPROX) 220 MG tablet Take 220 mg by mouth 2 (two) times daily as needed (for arthritis pain).    Yes Historical Provider, MD  nitroGLYCERIN (NITROSTAT) 0.4 MG SL tablet Place 0.4 mg under the tongue every 5 (five) minutes as needed for chest pain.   Yes Historical Provider, MD  omega-3 acid ethyl esters (LOVAZA) 1 G capsule Take 1 g by mouth daily.   Yes Historical Provider, MD  traMADol (ULTRAM) 50 MG tablet Take 1 tablet (50 mg total) by mouth every 6 (six) hours as needed for moderate pain. 04/01/14  Yes Dickie La, MD  VENTOLIN HFA 108 (90 BASE) MCG/ACT inhaler INhaLe 2 PufFS by mouth every 4 hours as needed for shortness of breath or wheezing 07/19/14  Yes Historical Provider, MD  HYDROcodone-acetaminophen (NORCO/VICODIN) 5-325 MG per tablet Take 2 tablets by mouth every 4 (four) hours as needed for moderate pain. Patient not taking: Reported on 08/10/2014 02/20/14   Charlynne Cousins, MD  meloxicam [facility]) 15 MG tablet Take one by mouth daily for hip and arthritis pain do not take over the counter NSAIDs wit this medicine Patient not taking: Reported on 08/10/2014 04/01/14   Dickie La, MD   BP 128/78 mmHg  Pulse 114  Temp(Src) 98.9 F (37.2 C) (Oral)  Resp 20  SpO2 99% Physical Exam  Constitutional: She is oriented to person, place, and time. She appears well-developed and well-nourished.  HENT:  Head: Normocephalic.  Mouth/Throat: No oropharyngeal exudate.  Mild cobblestoning of the posterior oropharynx which appears dry.  Eyes: Conjunctivae  and EOM are normal. Pupils are equal, round, and reactive to light.  Neck: Normal range of motion. Neck supple.  Cardiovascular: Regular rhythm, normal heart sounds and intact distal pulses.  Exam reveals no gallop and no friction rub.   No murmur heard. HR 112  Pulmonary/Chest: Effort normal and breath sounds normal. No respiratory distress. She has no wheezes.  Abdominal: Soft. Bowel sounds are normal. There is tenderness (nonspecific periumbilical mild tenderness to palpation.). There is no rebound and no guarding.  Musculoskeletal: Normal range of motion. She exhibits no edema or tenderness.  Neurological: She is alert and oriented to person, place, and time.  Skin: Skin is warm and dry.  Psychiatric: She has a normal mood and affect. Her behavior is normal.  Nursing note and vitals reviewed.   ED  Course  Procedures (including critical care time) Labs Review Labs Reviewed  CBC WITH DIFFERENTIAL/PLATELET - Abnormal; Notable for the following:    RBC 3.80 (*)    Hemoglobin 11.2 (*)    HCT 34.9 (*)    All other components within normal limits  COMPREHENSIVE METABOLIC PANEL - Abnormal; Notable for the following:    Glucose, Bld 116 (*)    BUN 29 (*)    Creatinine, Ser 1.18 (*)    GFR calc non Af Amer 41 (*)    GFR calc Af Amer 47 (*)    All other components within normal limits  LIPASE, BLOOD - Abnormal; Notable for the following:    Lipase 14 (*)    All other components within normal limits  URINALYSIS, ROUTINE W REFLEX MICROSCOPIC (NOT AT [facility]) - Abnormal; Notable for the following:    Bilirubin Urine SMALL (*)    All other components within normal limits  COMPREHENSIVE METABOLIC PANEL - Abnormal; Notable for the following:    BUN 29 (*)    Creatinine, Ser 1.25 (*)    Calcium 8.5 (*)    GFR calc non Af Amer 38 (*)    GFR calc Af Amer 44 (*)    All other components within normal limits  CBC - Abnormal; Notable for the following:    RBC 3.48 (*)    Hemoglobin 10.2 (*)     HCT 32.5 (*)    All other components within normal limits  H.PYLORI ANTIGEN, STOOL  I-STAT TROPOININ, ED    Imaging Review Dg Chest 2 View  08/10/2014   CLINICAL DATA:  Intermittent shortness of breath  EXAM: CHEST  2 VIEW  COMPARISON:  April 27, 2013  FINDINGS: There is persistent elevation the left hemidiaphragm. There is patchy left base atelectasis. There is no appreciable edema or consolidation. The heart is slightly enlarged but stable. The pulmonary vascularity is normal. No adenopathy. There are surgical clips in the left breast region. There is degenerative change in thoracic spine.  IMPRESSION: Stable elevation left hemidiaphragm with left base atelectasis. No edema or consolidation. Heart prominent but stable. Pulmonary vascularity within normal limits.   Electronically Signed   By: Lowella Grip III M.D.   On: 08/10/2014 20:24   Nm Hepatobiliary Liver Func  08/11/2014   CLINICAL DATA:  Predominantly lower abdominal pain and nausea for 3 weeks. No vomiting. Possible acute cholecystitis.  EXAM: NUCLEAR MEDICINE HEPATOBILIARY IMAGING  TECHNIQUE: Sequential images of the abdomen were obtained out to 60 minutes following intravenous administration of radiopharmaceutical.  RADIOPHARMACEUTICALS:  5.0 mCi Tc-66m  Choletec IV  COMPARISON:  Right upper quadrant ultrasound 08/10/2014  FINDINGS: There is prompt radiotracer uptake by the liver with excretion into the biliary system. Gallbladder activity is identified by 20 minutes with progressive accumulation. Radiotracer excretion into the small bowel is identified during the second hour of imaging.  IMPRESSION: Patent cystic duct without evidence of acute cholecystitis.   Electronically Signed   By: Logan Bores   On: 08/11/2014 11:49   Ct Abdomen Pelvis W Contrast  08/10/2014   CLINICAL DATA:  Subacute onset of mid and upper abdominal pain for 2 weeks. Throat burning. Nausea. Decreased appetite and oral intake. Initial encounter.  EXAM: CT  ABDOMEN AND PELVIS WITH CONTRAST  TECHNIQUE: Multidetector CT imaging of the abdomen and pelvis was performed using the standard protocol following bolus administration of intravenous contrast.  CONTRAST:  178mL OMNIPAQUE IOHEXOL 300 MG/ML  SOLN  COMPARISON:  CT of  the pelvis performed 02/19/2014, and pelvic ultrasound performed 10/14/2003  FINDINGS: Small bilateral pleural effusions are noted. The heart is mildly enlarged. Scattered coronary artery calcifications are seen.  Pericholecystic fluid is noted, and a stone is noted within the gallbladder. This raises question for mild acute cholecystitis. Would correlate for associated symptoms. A 1.2 cm cystic focus within the left hepatic lobe is of uncertain significance. Trace ascites is noted tracking about the inferior tip of the liver.  The visualized portions of the spleen are grossly unremarkable. The pancreas demonstrates minimal surrounding soft tissue stranding, though this remains nonspecific. There is mild stranding within the small bowel mesentery, with scattered associated borderline prominent nodes. The adrenal glands are unremarkable in appearance.  Mild calcification adjacent to the left adrenal gland may be vascular in nature.  Mild bilateral renal atrophy is noted. Nonspecific perinephric stranding is noted bilaterally. A few scattered small bilateral renal cysts are seen. There is no evidence of hydronephrosis. No renal or ureteral stones are identified.  The small bowel is unremarkable in appearance. The stomach is within normal limits. No acute vascular abnormalities are seen. Scattered calcification is noted along the abdominal aorta.  The appendix is normal in caliber, without evidence of appendicitis. Trace fluid is noted along the paracolic gutters bilaterally, more prominent on the right. Minimal diverticulosis is noted at the proximal sigmoid colon. The colon is otherwise unremarkable.  The bladder is decompressed and not well seen. A small  amount of free fluid is noted within the pelvis. The uterus is grossly unremarkable in appearance. The ovaries are relatively symmetric. No suspicious adnexal masses are seen. No inguinal lymphadenopathy is seen.  No acute osseous abnormalities are identified. There is mild chronic loss of height at the superior endplate of vertebral body L4. The patient is status post decompression at L3 and L4.  IMPRESSION: 1. Pericholecystic fluid noted, with cholelithiasis. Though this is nonspecific given underlying trace ascites, it raises question for mild acute cholecystitis. Would correlate for associated symptoms. 2. Nonspecific minimal soft tissue stranding about the pancreas, without definite evidence for pancreatitis. Mild stranding is also noted within the adjacent small bowel mesentery, with associated borderline prominent nodes. This may reflect the patient's baseline. 3. Trace ascites noted about the liver and within the pelvis. 4. Small bilateral pleural effusions noted. 5. Mild cardiomegaly. Scattered coronary artery calcifications seen. 6. Nonspecific 1.2 cm cystic focus within the left hepatic lobe. 7. Mild bilateral renal atrophy noted. Scattered bilateral renal cysts seen. 8. Scattered calcification along the abdominal aorta. 9. Minimal diverticulosis of the proximal sigmoid colon, without evidence of diverticulitis. 10. Mild chronic loss of height at the superior endplate of vertebral body L4.   Electronically Signed   By: Garald Balding M.D.   On: 08/10/2014 22:30   US Abdomen Limited Ruq  08/11/2014   CLINICAL DATA:  81 year old female with right upper quadrant abdominal pain  EXAM: US ABDOMEN LIMITED - RIGHT UPPER QUADRANT  COMPARISON:  CT dated 08/10/2014  FINDINGS: Gallbladder:  There is a 1.2 cm stone in the gallbladder. There is thickening of the anterior gallbladder wall measuring up to 1 cm. No significant pericholecystic fluid. No tenderness was elicited over the gallbladder area during scanning.   Common bile duct:  Diameter: 4 mm  Liver:  No focal lesion identified. Within normal limits in parenchymal echogenicity.  A right-sided pleural effusion is partially visualized.  IMPRESSION: Cholelithiasis with thickened anterior gallbladder wall. Findings may represent acute cholecystitis. A hepatobiliary scintigraphy may provide better  evaluation of the gallbladder if an acute cholecystitis is clinically suspected.   Electronically Signed   By: Anner Crete M.D.   On: 08/11/2014 00:05     EKG Interpretation   Date/Time:  Saturday August 10 2014 20:10:40 EDT Ventricular Rate:  117 PR Interval:  146 QRS Duration: 141 QT Interval:  366 QTC Calculation: 511 R Axis:   -36 Text Interpretation:  Sinus tachycardia Left bundle branch block Confirmed  by Squire Withey  MD, Venisa Frampton ([id number]) on 08/10/2014 8:20:35 PM      MDM   Final diagnoses:  Periumbilical abdominal pain  SOB (shortness of breath)  Abdominal pain    8:21 PM 81 y.o. female w hx of known LBBB, HTN who presents with intermittent peri-umbilical abdominal pain for the last 2 weeks.  Patient has had nausea and fatigue.  She also noted some shortness of breath with exertion.  She has had decreased oral intake due to lack of appetite.  Tachycardic here but vital signs otherwise unremarkable.  She denies fevers.  Was seen at [facility] urgent care today and recommended to present to the ER.  Pain worsened which it did.  We'll get screening lab work and imaging, fentanyl for pain.  12:35 AM case discussed with Dr. Zella Richer.  Will admit to medicine for hida scan.   Pamella Pert, MD 08/11/14 [phone]

## 2014-08-10 NOTE — ED Notes (Signed)
Pt c/o mid abd pain and sore throat x 2 wks. Pt states that she has been seeing her PCP for it but they have not been able to determine a source.  Nauseated but no vomiting.  Denies diarrhea.  Denies dysuria.

## 2014-08-10 NOTE — ED Notes (Signed)
Delay in lab draw, pt not in room 

## 2014-08-10 NOTE — ED Notes (Signed)
Pt reports mid/upper abd pain x2 weeks, worse today, more constant. Also c/o throat hurting, as if there is a lump in her throat, sts she has to sit forward to swallow anything and it hurts to. C/o nausea but denies vomiting. Decreased appetite and PO intake since yesterday. PCP felt some of this may be related to grief due to the recent passing of her husband, but he noticed a drop in her iron today and recommended she come to ED and that she may need a CT scan.

## 2014-08-10 NOTE — ED Notes (Signed)
Pt returned from lab,

## 2014-08-10 NOTE — ED Notes (Signed)
Pt given narcotic for abdominal pain and her oxygen dropped to 83 %  This writer placed patient on O2 2L [place] and oxygen at 95 % now,  Pt in NAD

## 2014-08-11 ENCOUNTER — Observation Stay (HOSPITAL_COMMUNITY): Payer: Commercial Managed Care - HMO

## 2014-08-11 ENCOUNTER — Encounter (HOSPITAL_COMMUNITY): Payer: Self-pay | Admitting: General Surgery

## 2014-08-11 DIAGNOSIS — M8580 Other specified disorders of bone density and structure, unspecified site: Secondary | ICD-10-CM | POA: Diagnosis present

## 2014-08-11 DIAGNOSIS — Y95 Nosocomial condition: Secondary | ICD-10-CM | POA: Diagnosis present

## 2014-08-11 DIAGNOSIS — F419 Anxiety disorder, unspecified: Secondary | ICD-10-CM | POA: Diagnosis present

## 2014-08-11 DIAGNOSIS — R1011 Right upper quadrant pain: Secondary | ICD-10-CM | POA: Diagnosis present

## 2014-08-11 DIAGNOSIS — N183 Chronic kidney disease, stage 3 (moderate): Secondary | ICD-10-CM | POA: Diagnosis present

## 2014-08-11 DIAGNOSIS — D638 Anemia in other chronic diseases classified elsewhere: Secondary | ICD-10-CM | POA: Diagnosis present

## 2014-08-11 DIAGNOSIS — Z888 Allergy status to other drugs, medicaments and biological substances status: Secondary | ICD-10-CM | POA: Diagnosis not present

## 2014-08-11 DIAGNOSIS — Z7982 Long term (current) use of aspirin: Secondary | ICD-10-CM | POA: Diagnosis not present

## 2014-08-11 DIAGNOSIS — I272 Other secondary pulmonary hypertension: Secondary | ICD-10-CM | POA: Diagnosis present

## 2014-08-11 DIAGNOSIS — M1990 Unspecified osteoarthritis, unspecified site: Secondary | ICD-10-CM | POA: Diagnosis present

## 2014-08-11 DIAGNOSIS — K810 Acute cholecystitis: Secondary | ICD-10-CM | POA: Diagnosis present

## 2014-08-11 DIAGNOSIS — R1033 Periumbilical pain: Secondary | ICD-10-CM | POA: Diagnosis present

## 2014-08-11 DIAGNOSIS — E039 Hypothyroidism, unspecified: Secondary | ICD-10-CM | POA: Diagnosis present

## 2014-08-11 DIAGNOSIS — E669 Obesity, unspecified: Secondary | ICD-10-CM | POA: Diagnosis present

## 2014-08-11 DIAGNOSIS — F329 Major depressive disorder, single episode, unspecified: Secondary | ICD-10-CM | POA: Diagnosis present

## 2014-08-11 DIAGNOSIS — Z820 Family history of epilepsy and other diseases of the nervous system: Secondary | ICD-10-CM | POA: Diagnosis not present

## 2014-08-11 DIAGNOSIS — M25511 Pain in right shoulder: Secondary | ICD-10-CM | POA: Diagnosis present

## 2014-08-11 DIAGNOSIS — R1013 Epigastric pain: Secondary | ICD-10-CM | POA: Diagnosis not present

## 2014-08-11 DIAGNOSIS — K8000 Calculus of gallbladder with acute cholecystitis without obstruction: Secondary | ICD-10-CM | POA: Diagnosis present

## 2014-08-11 DIAGNOSIS — I129 Hypertensive chronic kidney disease with stage 1 through stage 4 chronic kidney disease, or unspecified chronic kidney disease: Secondary | ICD-10-CM | POA: Diagnosis present

## 2014-08-11 DIAGNOSIS — G609 Hereditary and idiopathic neuropathy, unspecified: Secondary | ICD-10-CM | POA: Diagnosis present

## 2014-08-11 DIAGNOSIS — E038 Other specified hypothyroidism: Secondary | ICD-10-CM | POA: Diagnosis not present

## 2014-08-11 DIAGNOSIS — I5021 Acute systolic (congestive) heart failure: Secondary | ICD-10-CM | POA: Diagnosis not present

## 2014-08-11 DIAGNOSIS — R109 Unspecified abdominal pain: Secondary | ICD-10-CM | POA: Insufficient documentation

## 2014-08-11 DIAGNOSIS — I480 Paroxysmal atrial fibrillation: Secondary | ICD-10-CM | POA: Diagnosis not present

## 2014-08-11 DIAGNOSIS — Z823 Family history of stroke: Secondary | ICD-10-CM | POA: Diagnosis not present

## 2014-08-11 DIAGNOSIS — Z6836 Body mass index (BMI) 36.0-36.9, adult: Secondary | ICD-10-CM | POA: Diagnosis not present

## 2014-08-11 DIAGNOSIS — I429 Cardiomyopathy, unspecified: Secondary | ICD-10-CM | POA: Diagnosis present

## 2014-08-11 DIAGNOSIS — R1030 Lower abdominal pain, unspecified: Secondary | ICD-10-CM | POA: Diagnosis not present

## 2014-08-11 DIAGNOSIS — K219 Gastro-esophageal reflux disease without esophagitis: Secondary | ICD-10-CM | POA: Diagnosis present

## 2014-08-11 DIAGNOSIS — E780 Pure hypercholesterolemia: Secondary | ICD-10-CM | POA: Diagnosis present

## 2014-08-11 DIAGNOSIS — J189 Pneumonia, unspecified organism: Secondary | ICD-10-CM | POA: Diagnosis present

## 2014-08-11 DIAGNOSIS — Z79891 Long term (current) use of opiate analgesic: Secondary | ICD-10-CM | POA: Diagnosis not present

## 2014-08-11 DIAGNOSIS — I447 Left bundle-branch block, unspecified: Secondary | ICD-10-CM | POA: Diagnosis present

## 2014-08-11 DIAGNOSIS — J45909 Unspecified asthma, uncomplicated: Secondary | ICD-10-CM | POA: Diagnosis present

## 2014-08-11 DIAGNOSIS — Z833 Family history of diabetes mellitus: Secondary | ICD-10-CM | POA: Diagnosis not present

## 2014-08-11 DIAGNOSIS — I471 Supraventricular tachycardia: Secondary | ICD-10-CM | POA: Diagnosis not present

## 2014-08-11 DIAGNOSIS — I10 Essential (primary) hypertension: Secondary | ICD-10-CM | POA: Diagnosis not present

## 2014-08-11 DIAGNOSIS — Z853 Personal history of malignant neoplasm of breast: Secondary | ICD-10-CM | POA: Diagnosis not present

## 2014-08-11 DIAGNOSIS — R110 Nausea: Secondary | ICD-10-CM | POA: Diagnosis not present

## 2014-08-11 DIAGNOSIS — I472 Ventricular tachycardia: Secondary | ICD-10-CM | POA: Diagnosis not present

## 2014-08-11 DIAGNOSIS — Z79899 Other long term (current) drug therapy: Secondary | ICD-10-CM | POA: Diagnosis not present

## 2014-08-11 DIAGNOSIS — Z800 Family history of malignant neoplasm of digestive organs: Secondary | ICD-10-CM | POA: Diagnosis not present

## 2014-08-11 LAB — CBC
HEMATOCRIT: 32.5 % — AB (ref 36.0–46.0)
Hemoglobin: 10.2 g/dL — ABNORMAL LOW (ref 12.0–15.0)
MCH: 29.3 pg (ref 26.0–34.0)
MCHC: 31.4 g/dL (ref 30.0–36.0)
MCV: 93.4 fL (ref 78.0–100.0)
Platelets: 174 10*3/uL (ref 150–400)
RBC: 3.48 MIL/uL — ABNORMAL LOW (ref 3.87–5.11)
RDW: 14.6 % (ref 11.5–15.5)
WBC: 6.1 10*3/uL (ref 4.0–10.5)

## 2014-08-11 LAB — COMPREHENSIVE METABOLIC PANEL
ALBUMIN: 3.5 g/dL (ref 3.5–5.0)
ALT: 25 U/L (ref 14–54)
ANION GAP: 7 (ref 5–15)
AST: 29 U/L (ref 15–41)
Alkaline Phosphatase: 41 U/L (ref 38–126)
BUN: 29 mg/dL — AB (ref 6–20)
CALCIUM: 8.5 mg/dL — AB (ref 8.9–10.3)
CO2: 25 mmol/L (ref 22–32)
CREATININE: 1.25 mg/dL — AB (ref 0.44–1.00)
Chloride: 105 mmol/L (ref 101–111)
GFR calc Af Amer: 44 mL/min — ABNORMAL LOW (ref >60)
GFR calc non Af Amer: 38 mL/min — ABNORMAL LOW (ref >60)
Glucose, Bld: 95 mg/dL (ref 65–99)
Potassium: 4.6 mmol/L (ref 3.5–5.1)
SODIUM: 137 mmol/L (ref 135–145)
TOTAL PROTEIN: 6.8 g/dL (ref 6.5–8.1)
Total Bilirubin: 0.9 mg/dL (ref 0.3–1.2)

## 2014-08-11 MED ORDER — CHLORHEXIDINE GLUCONATE 0.12 % MT SOLN
15.0000 mL | Freq: Two times a day (BID) | OROMUCOSAL | Status: DC
  Administered 2014-08-11 – 2014-08-16 (×8): 15 mL via OROMUCOSAL
  Filled 2014-08-11 (×12): qty 15

## 2014-08-11 MED ORDER — CEFTRIAXONE SODIUM 1 G IJ SOLR
1.0000 g | Freq: Once | INTRAMUSCULAR | Status: AC
  Administered 2014-08-11: 1 g via INTRAVENOUS
  Filled 2014-08-11: qty 10

## 2014-08-11 MED ORDER — ONDANSETRON HCL 4 MG/2ML IJ SOLN
4.0000 mg | Freq: Four times a day (QID) | INTRAMUSCULAR | Status: DC | PRN
  Administered 2014-08-11 – 2014-08-12 (×2): 4 mg via INTRAVENOUS
  Filled 2014-08-11 (×3): qty 2

## 2014-08-11 MED ORDER — ALBUTEROL SULFATE HFA 108 (90 BASE) MCG/ACT IN AERS: 2.0000 | INHALATION_SPRAY | RESPIRATORY_TRACT | Status: DC | PRN

## 2014-08-11 MED ORDER — TECHNETIUM TC 99M MEBROFENIN IV KIT
5.0000 | PACK | Freq: Once | INTRAVENOUS | Status: AC | PRN
  Administered 2014-08-11: 5 via INTRAVENOUS

## 2014-08-11 MED ORDER — ASPIRIN EC 81 MG PO TBEC
81.0000 mg | DELAYED_RELEASE_TABLET | Freq: Every day | ORAL | Status: DC
  Administered 2014-08-11 – 2014-08-12 (×2): 81 mg via ORAL
  Filled 2014-08-11 (×3): qty 1

## 2014-08-11 MED ORDER — HYDROMORPHONE HCL 1 MG/ML IJ SOLN
0.5000 mg | INTRAMUSCULAR | Status: DC | PRN
  Administered 2014-08-12 – 2014-08-13 (×5): 0.5 mg via INTRAVENOUS
  Filled 2014-08-11 (×6): qty 1

## 2014-08-11 MED ORDER — PANTOPRAZOLE SODIUM 40 MG IV SOLR
40.0000 mg | Freq: Two times a day (BID) | INTRAVENOUS | Status: DC
  Administered 2014-08-11 – 2014-08-12 (×2): 40 mg via INTRAVENOUS
  Filled 2014-08-11 (×3): qty 40

## 2014-08-11 MED ORDER — SUCRALFATE 1 GM/10ML PO SUSP
1.0000 g | Freq: Three times a day (TID) | ORAL | Status: DC
  Administered 2014-08-11 – 2014-08-16 (×14): 1 g via ORAL
  Filled 2014-08-11 (×22): qty 10

## 2014-08-11 MED ORDER — ALBUTEROL SULFATE (2.5 MG/3ML) 0.083% IN NEBU
2.5000 mg | INHALATION_SOLUTION | RESPIRATORY_TRACT | Status: DC | PRN
  Administered 2014-08-16 (×2): 2.5 mg via RESPIRATORY_TRACT
  Filled 2014-08-11 (×3): qty 3

## 2014-08-11 MED ORDER — SODIUM CHLORIDE 0.9 % IV SOLN
INTRAVENOUS | Status: DC
  Administered 2014-08-11: 100 mL/h via INTRAVENOUS
  Administered 2014-08-11 – 2014-08-14 (×5): via INTRAVENOUS
  Filled 2014-08-11: qty 1000

## 2014-08-11 MED ORDER — CEFTRIAXONE SODIUM IN DEXTROSE 40 MG/ML IV SOLN
2.0000 g | INTRAVENOUS | Status: DC
  Administered 2014-08-11: 2 g via INTRAVENOUS
  Filled 2014-08-11: qty 50

## 2014-08-11 MED ORDER — HEPARIN SODIUM (PORCINE) 5000 UNIT/ML IJ SOLN
5000.0000 [IU] | Freq: Three times a day (TID) | INTRAMUSCULAR | Status: DC
  Administered 2014-08-11 – 2014-08-13 (×8): 5000 [IU] via SUBCUTANEOUS
  Filled 2014-08-11 (×11): qty 1

## 2014-08-11 MED ORDER — FENTANYL CITRATE (PF) 100 MCG/2ML IJ SOLN
50.0000 ug | Freq: Once | INTRAMUSCULAR | Status: AC
  Administered 2014-08-11: 50 ug via INTRAVENOUS
  Filled 2014-08-11: qty 2

## 2014-08-11 MED ORDER — PANTOPRAZOLE SODIUM 40 MG IV SOLR: 40.0000 mg | Freq: Two times a day (BID) | INTRAVENOUS | Status: DC

## 2014-08-11 MED ORDER — ALPRAZOLAM 0.25 MG PO TABS
0.2500 mg | ORAL_TABLET | Freq: Three times a day (TID) | ORAL | Status: DC | PRN
  Administered 2014-08-11 – 2014-08-17 (×7): 0.25 mg via ORAL
  Filled 2014-08-11 (×8): qty 1

## 2014-08-11 MED ORDER — METRONIDAZOLE IN NACL 5-0.79 MG/ML-% IV SOLN
500.0000 mg | Freq: Three times a day (TID) | INTRAVENOUS | Status: DC
  Administered 2014-08-11 (×2): 500 mg via INTRAVENOUS
  Filled 2014-08-11 (×2): qty 100

## 2014-08-11 MED ORDER — SODIUM CHLORIDE 0.9 % IV SOLN: INTRAVENOUS | Status: DC

## 2014-08-11 MED ORDER — SUCRALFATE 1 GM/10ML PO SUSP: 1.0000 g | Freq: Three times a day (TID) | ORAL | Status: DC

## 2014-08-11 MED ORDER — DEXTROSE 5 % IV SOLN: 1.0000 g | INTRAVENOUS | Status: DC

## 2014-08-11 MED ORDER — LEVOTHYROXINE SODIUM 25 MCG PO TABS
25.0000 ug | ORAL_TABLET | Freq: Every day | ORAL | Status: DC
  Administered 2014-08-11 – 2014-08-17 (×7): 25 ug via ORAL
  Filled 2014-08-11 (×11): qty 1

## 2014-08-11 NOTE — Consult Note (Signed)
Reason for Consult: Abdominal pain and cholelithiasis Referring Physician: Dr. Lynnell Chad is an 81 y.o. female.  HPI: This is a 81 year old female who has a one-month history of recurring periumbilical abdominal pain. It seems to be worst after eating at times. It is associated with nausea. Sometimes she feels  pain in her right shoulder as well. The pain got so severe that she presented to the emergency department for evaluation. Most of her pain and tenderness were in the periumbilical region when she was examined by the emergency department physician. A CT scan demonstrated a gallstone with small amount of fluid around the gallbladder and questionable mild gallbladder wall thickening. There was also some free fluid in the pelvis, some mesenteric edema, and some mild pancreatic inflammatory changes. Ultrasound demonstrated a gallstone with anterior gallbladder wall thickening and some fluid around the gallbladder.  Liver function tests are not elevated. Lipase is not elevated. White blood cell count is normal. There is no left shift.  Past Medical History  Diagnosis Date  . Arthritis   . Headache(784.0)   . Anxiety   . Depression   . Asthma   . CAP (community acquired pneumonia) 12/19/2012  . Intermittent LBBB (left bundle branch block) 12/21/2012  . Obesity   . Peripheral neuropathy   . Sacroiliitis, not elsewhere classified   . Osteopenia   . Hypercholesteremia   . Neuropathy   . Trigger finger     [name]  . Chest pain     NM stress test 05/2011 Normal  . DOE (dyspnea on exertion)     No SOB at rest  . Fatigue     excertional  . breast ca dx'd 2000    left  . Essential (primary) hypertension 04/02/2014  . Adult hypothyroidism 04/02/2014  . Hereditary and idiopathic peripheral neuropathy 07/02/2014  . Leg weakness     Past Surgical History  Procedure Laterality Date  . Leg surgery    . Breast lumpectomy      breast cancer  . Lumbar laminectomy    .  Achilles tendon surgery    . Tonsillectomy    . Vesicovaginal fistula closure w/ tah      DC hysterectomy  . Polypectomy      Family History  Problem Relation Age of Onset  . Pneumonia Mother 40    cause of death  . Diabetes Brother   . Alzheimer's disease Sister   . CVA Daughter     01/21/09  . Pancreatic cancer Father   . Pneumonia Mother     Social History:  reports that she has never smoked. She has never used smokeless tobacco. She reports that she does not drink alcohol or use illicit drugs.  Allergies:  Allergies  Allergen Reactions  . Cymbalta [Duloxetine Hcl] Other (See Comments)    Depression  . Lyrica [Pregabalin] Other (See Comments)    Blurry vision    Prior to Admission medications   Medication Sig Start Date End Date Taking? Authorizing Provider  alendronate (FOSAMAX) 70 MG tablet Take 70 mg by mouth every Saturday. Take with a full glass of water on an empty stomach.   Yes Historical Provider, MD  ALPRAZolam (XANAX) 0.25 MG tablet TK 1 T PO TID PRN anxiety 07/19/14  Yes Historical Provider, MD  aspirin EC 81 MG tablet Take 81 mg by mouth daily.   Yes Historical Provider, MD  b complex vitamins tablet Take 1 tablet by mouth daily.   Yes Historical  Provider, MD  Calcium-Vitamin D (CALTRATE 600 PLUS-VIT D PO) Take 1 tablet by mouth 2 (two) times daily.   Yes Historical Provider, MD  cholecalciferol (VITAMIN D) 1000 UNITS tablet Take 1,000 Units by mouth daily.   Yes Historical Provider, MD  levothyroxine (SYNTHROID, LEVOTHROID) 25 MCG tablet Take 25 mcg by mouth daily before breakfast.   Yes Historical Provider, MD  Multiple Vitamin (MULITIVITAMIN WITH MINERALS) TABS Take 1 tablet by mouth daily.   Yes Historical Provider, MD  naproxen sodium (ANAPROX) 220 MG tablet Take 220 mg by mouth 2 (two) times daily as needed (for arthritis pain).    Yes Historical Provider, MD  nitroGLYCERIN (NITROSTAT) 0.4 MG SL tablet Place 0.4 mg under the tongue every 5 (five) minutes as  needed for chest pain.   Yes Historical Provider, MD  omega-3 acid ethyl esters (LOVAZA) 1 G capsule Take 1 g by mouth daily.   Yes Historical Provider, MD  traMADol (ULTRAM) 50 MG tablet Take 1 tablet (50 mg total) by mouth every 6 (six) hours as needed for moderate pain. 04/01/14  Yes Dickie La, MD  VENTOLIN HFA 108 (90 BASE) MCG/ACT inhaler INhaLe 2 PufFS by mouth every 4 hours as needed for shortness of breath or wheezing 07/19/14  Yes Historical Provider, MD     Results for orders placed or performed during the hospital encounter of 08/10/14 (from the past 48 hour(s))  CBC with Differential/Platelet     Status: Abnormal   Collection Time: 08/10/14  8:24 PM  Result Value Ref Range   WBC 6.7 4.0 - 10.5 K/uL   RBC 3.80 (L) 3.87 - 5.11 MIL/uL   Hemoglobin 11.2 (L) 12.0 - 15.0 g/dL   HCT 34.9 (L) 36.0 - 46.0 %   MCV 91.8 78.0 - 100.0 fL   MCH 29.5 26.0 - 34.0 pg   MCHC 32.1 30.0 - 36.0 g/dL   RDW 14.3 11.5 - 15.5 %   Platelets 193 150 - 400 K/uL   Neutrophils Relative % 63 43 - 77 %   Neutro Abs 4.2 1.7 - 7.7 K/uL   Lymphocytes Relative 29 12 - 46 %   Lymphs Abs 2.0 0.7 - 4.0 K/uL   Monocytes Relative 6 3 - 12 %   Monocytes Absolute 0.4 0.1 - 1.0 K/uL   Eosinophils Relative 2 0 - 5 %   Eosinophils Absolute 0.1 0.0 - 0.7 K/uL   Basophils Relative 0 0 - 1 %   Basophils Absolute 0.0 0.0 - 0.1 K/uL  Comprehensive metabolic panel     Status: Abnormal   Collection Time: 08/10/14  8:24 PM  Result Value Ref Range   Sodium 139 135 - 145 mmol/L   Potassium 4.6 3.5 - 5.1 mmol/L   Chloride 107 101 - 111 mmol/L   CO2 25 22 - 32 mmol/L   Glucose, Bld 116 (H) 65 - 99 mg/dL   BUN 29 (H) 6 - 20 mg/dL   Creatinine, Ser 1.18 (H) 0.44 - 1.00 mg/dL   Calcium 9.2 8.9 - 10.3 mg/dL   Total Protein 7.9 6.5 - 8.1 g/dL   Albumin 4.0 3.5 - 5.0 g/dL   AST 30 15 - 41 U/L   ALT 21 14 - 54 U/L   Alkaline Phosphatase 46 38 - 126 U/L   Total Bilirubin 0.9 0.3 - 1.2 mg/dL   GFR calc non Af Amer 41 (L) >60  mL/min   GFR calc Af Amer 47 (L) >60 mL/min  Comment: (NOTE) The eGFR has been calculated using the CKD EPI equation. This calculation has not been validated in all clinical situations. eGFR's persistently <60 mL/min signify possible Chronic Kidney Disease.    Anion gap 7 5 - 15  Lipase, blood     Status: Abnormal   Collection Time: 08/10/14  8:24 PM  Result Value Ref Range   Lipase 14 (L) 22 - 51 U/L  I-stat troponin, ED     Status: None   Collection Time: 08/10/14  8:29 PM  Result Value Ref Range   Troponin i, poc 0.05 0.00 - 0.08 ng/mL   Comment 3            Comment: Due to the release kinetics of cTnI, a negative result within the first hours of the onset of symptoms does not rule out myocardial infarction with certainty. If myocardial infarction is still suspected, repeat the test at appropriate intervals.   Urinalysis, Routine w reflex microscopic (not at [facility])     Status: Abnormal   Collection Time: 08/10/14  9:11 PM  Result Value Ref Range   Color, Urine YELLOW YELLOW   APPearance CLEAR CLEAR   Specific Gravity, Urine 1.020 1.005 - 1.030   pH 5.0 5.0 - 8.0   Glucose, UA NEGATIVE NEGATIVE mg/dL   Hgb urine dipstick NEGATIVE NEGATIVE   Bilirubin Urine SMALL (A) NEGATIVE   Ketones, ur NEGATIVE NEGATIVE mg/dL   Protein, ur NEGATIVE NEGATIVE mg/dL   Urobilinogen, UA 0.2 0.0 - 1.0 mg/dL   Nitrite NEGATIVE NEGATIVE   Leukocytes, UA NEGATIVE NEGATIVE    Comment: MICROSCOPIC NOT DONE ON URINES WITH NEGATIVE PROTEIN, BLOOD, LEUKOCYTES, NITRITE, OR GLUCOSE <1000 mg/dL.    Dg Chest 2 View  08/10/2014   CLINICAL DATA:  Intermittent shortness of breath  EXAM: CHEST  2 VIEW  COMPARISON:  April 27, 2013  FINDINGS: There is persistent elevation the left hemidiaphragm. There is patchy left base atelectasis. There is no appreciable edema or consolidation. The heart is slightly enlarged but stable. The pulmonary vascularity is normal. No adenopathy. There are surgical clips in the  left breast region. There is degenerative change in thoracic spine.  IMPRESSION: Stable elevation left hemidiaphragm with left base atelectasis. No edema or consolidation. Heart prominent but stable. Pulmonary vascularity within normal limits.   Electronically Signed   By: Lowella Grip III M.D.   On: 08/10/2014 20:24   Ct Abdomen Pelvis W Contrast  08/10/2014   CLINICAL DATA:  Subacute onset of mid and upper abdominal pain for 2 weeks. Throat burning. Nausea. Decreased appetite and oral intake. Initial encounter.  EXAM: CT ABDOMEN AND PELVIS WITH CONTRAST  TECHNIQUE: Multidetector CT imaging of the abdomen and pelvis was performed using the standard protocol following bolus administration of intravenous contrast.  CONTRAST:  136m OMNIPAQUE IOHEXOL 300 MG/ML  SOLN  COMPARISON:  CT of the pelvis performed 02/19/2014, and pelvic ultrasound performed 10/14/2003  FINDINGS: Small bilateral pleural effusions are noted. The heart is mildly enlarged. Scattered coronary artery calcifications are seen.  Pericholecystic fluid is noted, and a stone is noted within the gallbladder. This raises question for mild acute cholecystitis. Would correlate for associated symptoms. A 1.2 cm cystic focus within the left hepatic lobe is of uncertain significance. Trace ascites is noted tracking about the inferior tip of the liver.  The visualized portions of the spleen are grossly unremarkable. The pancreas demonstrates minimal surrounding soft tissue stranding, though this remains nonspecific. There is mild stranding within the small  bowel mesentery, with scattered associated borderline prominent nodes. The adrenal glands are unremarkable in appearance.  Mild calcification adjacent to the left adrenal gland may be vascular in nature.  Mild bilateral renal atrophy is noted. Nonspecific perinephric stranding is noted bilaterally. A few scattered small bilateral renal cysts are seen. There is no evidence of hydronephrosis. No renal or  ureteral stones are identified.  The small bowel is unremarkable in appearance. The stomach is within normal limits. No acute vascular abnormalities are seen. Scattered calcification is noted along the abdominal aorta.  The appendix is normal in caliber, without evidence of appendicitis. Trace fluid is noted along the paracolic gutters bilaterally, more prominent on the right. Minimal diverticulosis is noted at the proximal sigmoid colon. The colon is otherwise unremarkable.  The bladder is decompressed and not well seen. A small amount of free fluid is noted within the pelvis. The uterus is grossly unremarkable in appearance. The ovaries are relatively symmetric. No suspicious adnexal masses are seen. No inguinal lymphadenopathy is seen.  No acute osseous abnormalities are identified. There is mild chronic loss of height at the superior endplate of vertebral body L4. The patient is status post decompression at L3 and L4.  IMPRESSION: 1. Pericholecystic fluid noted, with cholelithiasis. Though this is nonspecific given underlying trace ascites, it raises question for mild acute cholecystitis. Would correlate for associated symptoms. 2. Nonspecific minimal soft tissue stranding about the pancreas, without definite evidence for pancreatitis. Mild stranding is also noted within the adjacent small bowel mesentery, with associated borderline prominent nodes. This may reflect the patient's baseline. 3. Trace ascites noted about the liver and within the pelvis. 4. Small bilateral pleural effusions noted. 5. Mild cardiomegaly. Scattered coronary artery calcifications seen. 6. Nonspecific 1.2 cm cystic focus within the left hepatic lobe. 7. Mild bilateral renal atrophy noted. Scattered bilateral renal cysts seen. 8. Scattered calcification along the abdominal aorta. 9. Minimal diverticulosis of the proximal sigmoid colon, without evidence of diverticulitis. 10. Mild chronic loss of height at the superior endplate of  vertebral body L4.   Electronically Signed   By: Garald Balding M.D.   On: 08/10/2014 22:30   US Abdomen Limited Ruq  08/11/2014   CLINICAL DATA:  81 year old female with right upper quadrant abdominal pain  EXAM: US ABDOMEN LIMITED - RIGHT UPPER QUADRANT  COMPARISON:  CT dated 08/10/2014  FINDINGS: Gallbladder:  There is a 1.2 cm stone in the gallbladder. There is thickening of the anterior gallbladder wall measuring up to 1 cm. No significant pericholecystic fluid. No tenderness was elicited over the gallbladder area during scanning.  Common bile duct:  Diameter: 4 mm  Liver:  No focal lesion identified. Within normal limits in parenchymal echogenicity.  A right-sided pleural effusion is partially visualized.  IMPRESSION: Cholelithiasis with thickened anterior gallbladder wall. Findings may represent acute cholecystitis. A hepatobiliary scintigraphy may provide better evaluation of the gallbladder if an acute cholecystitis is clinically suspected.   Electronically Signed   By: Anner Crete M.D.   On: 08/11/2014 00:05    Review of Systems  Constitutional: Negative for fever and chills.  Respiratory: Positive for shortness of breath.   Cardiovascular: Negative for chest pain.  Gastrointestinal: Positive for nausea, abdominal pain and constipation.  Genitourinary: Negative for dysuria.   Blood pressure 113/73, pulse 108, temperature 98.9 F (37.2 C), temperature source Oral, resp. rate 0, SpO2 98 %. Physical Exam  Constitutional:  Overweight elderly female in no acute distress.  Eyes: No scleral icterus.  Cardiovascular:  Increased rate.  GI: Soft. She exhibits no distension and no mass. There is no tenderness. There is no guarding.  Neurological: She is alert.  Skin: Skin is warm and dry.    Assessment: Worsening periumbilical abdominal pain. She has cholelithiasis. Radiologic findings suggestive of possible acute cholecystitis although laboratory findings did not substantiate this and  her clinical history and physical exam are not typical for this.  Symptoms have been going on intermittently for 1 month but have acutely worsened in the last 48 hours.  Recommendation: Nuclear medicine hepatobiliary scan. May need cholecystectomy this admission. Further recommendations pending the results of the nuclear medicine hepatobiliary scan.     Tanay Massiah J 08/11/2014, 2:27 AM

## 2014-08-11 NOTE — ED Notes (Signed)
Dr. Gardner at bedside 

## 2014-08-11 NOTE — ED Notes (Signed)
Pt placed on bedpan and taken off then cleaned

## 2014-08-11 NOTE — Progress Notes (Signed)
Patient ID: Alexandra [name], female   DOB: 1927-02-06, 81 y.o.   MRN: [id number] Apparently had n/v last pm. abd pain feels some better. A little sleepy this am Sleepy Nontoxic Soft, obese, mild right sided ttp  HIDA pending. Will follow  Leighton Ruff. Redmond Pulling, MD, FACS General, Bariatric, & Minimally Invasive Surgery [facility], [place]

## 2014-08-11 NOTE — Progress Notes (Signed)
[facility] PROGRESS NOTE  Alexandra [name] [id number] DOB: 10-Aug-1927 DOA: 08/10/2014 PCP:  Melinda Crutch, MD  Brief Summary  Alexandra [name] is a 81 y.o. female who presents to the ED with a 2 week history of abdominal pain. Pain is located in her periumbilical area and RUQ, eating makes it worse. She has had poor PO intake over the past couple of days due to constant pain and associated nausea. She has nausea but no vomiting. No fever, no cough, no dysuria, no diarrhea. Symptoms have been worsening.  Assessment/Plan  Abdominal pain.  HIDA scan was negative so ddx includes gastritis, PUD, symptomatic gallstones, mesenteric ischemia or mild pancreatitis.  Of these, I think the latter is less likely since she does not have pain that radiates to her back and her lipase was normal, however, she did have some mild inflammation on her CT.  I recommended treating for gastritis/PUD and if that helps and she is able to tolerate a gradually advanced diet, following up as an outpatient with gastroenterology.  If this does not help, then perhaps she does have symptomatic gallstones and needs cholecystectomy. -  CLD -  Check H. Pylori stool antigen -  Start BID protonix -  Carafate -  Reassess tomorrow -  Appreciate surgery recommendations -  D/c empiric antibiotics -  Continue IVF until tolerating diet  CKD stage 3, creatinine stable -  Minimize nephrotoxins -  Would not use carafate long term  Anemia of chronic disease (normocytic) -  Hgb approximately stable  Diet:  CLD Access:  PIV IVF:  yes Proph:  Heparin (in case she does need GI consult or surgery)  Code Status: full Family Communication: patient and her son-in-law and son Disposition Plan: pending able to tolerate a regular diet, advancing slowly    Consultants:  General surgery  Procedures:  CT scan abd/pelvis  RUQ [place]  HIDA  CXR  Antibiotics:  Ceftriaxone and flagyl 7/24  only  HPI/Subjective:  States she had severe epigastric pain immediately with eating.  She has had a lot of stress recently with the death of her husband.  Denies NSAID or alcohol use.      Objective: Filed Vitals:   08/11/14 0130 08/11/14 0247 08/11/14 0518 08/11/14 1445  BP: 113/73 124/87 124/76 121/65  Pulse: 108 120 102 97  Temp:  97.6 F (36.4 C) 97.7 F (36.5 C) 98.2 F (36.8 C)  TempSrc:  Axillary Axillary Oral  Resp: 0 20 18 18   Height:  5\' 4"  (1.626 m)    Weight:  90.4 kg (199 lb 4.7 oz)    SpO2: 98% 95% 99% 100%    Intake/Output Summary (Last 24 hours) at 08/11/14 1657 Last data filed at 08/11/14 1400  Gross per 24 hour  Intake 1038.33 ml  Output    300 ml  Net 738.33 ml   Filed Weights   08/11/14 0247  Weight: 90.4 kg (199 lb 4.7 oz)   Body mass index is 34.19 kg/(m^2).  Exam:   General:  Adult female, No acute distress  HEENT:  NCAT, MMM  Cardiovascular:  RRR, nl S1, S2 no mrg, 2+ pulses, warm extremities  Respiratory:  CTAB, no increased WOB  Abdomen:   NABS, soft, ND, mild TTP in the epigastrium without rebound or guarding  MSK:   Normal tone and bulk, no LEE  Neuro:  Grossly intact  Data Reviewed: Basic Metabolic Panel:  Recent Labs Lab 08/10/14 2024 08/11/14 0810  NA 139 137  K  4.6 4.6  CL 107 105  CO2 25 25  GLUCOSE 116* 95  BUN 29* 29*  CREATININE 1.18* 1.25*  CALCIUM 9.2 8.5*   Liver Function Tests:  Recent Labs Lab 08/10/14 2024 08/11/14 0810  AST 30 29  ALT 21 25  ALKPHOS 46 41  BILITOT 0.9 0.9  PROT 7.9 6.8  ALBUMIN 4.0 3.5    Recent Labs Lab 08/10/14 2024  LIPASE 14*   No results for input(s): AMMONIA in the last 168 hours. CBC:  Recent Labs Lab 08/10/14 2024 08/11/14 0810  WBC 6.7 6.1  NEUTROABS 4.2  --   HGB 11.2* 10.2*  HCT 34.9* 32.5*  MCV 91.8 93.4  PLT 193 174    No results found for this or any previous visit (from the past 240 hour(s)).   Studies: Dg Chest 2 View  08/10/2014    CLINICAL DATA:  Intermittent shortness of breath  EXAM: CHEST  2 VIEW  COMPARISON:  April 27, 2013  FINDINGS: There is persistent elevation the left hemidiaphragm. There is patchy left base atelectasis. There is no appreciable edema or consolidation. The heart is slightly enlarged but stable. The pulmonary vascularity is normal. No adenopathy. There are surgical clips in the left breast region. There is degenerative change in thoracic spine.  IMPRESSION: Stable elevation left hemidiaphragm with left base atelectasis. No edema or consolidation. Heart prominent but stable. Pulmonary vascularity within normal limits.   Electronically Signed   By: Lowella Grip III M.D.   On: 08/10/2014 20:24   Nm Hepatobiliary Liver Func  08/11/2014   CLINICAL DATA:  Predominantly lower abdominal pain and nausea for 3 weeks. No vomiting. Possible acute cholecystitis.  EXAM: NUCLEAR MEDICINE HEPATOBILIARY IMAGING  TECHNIQUE: Sequential images of the abdomen were obtained out to 60 minutes following intravenous administration of radiopharmaceutical.  RADIOPHARMACEUTICALS:  5.0 mCi Tc-44m  Choletec IV  COMPARISON:  Right upper quadrant ultrasound 08/10/2014  FINDINGS: There is prompt radiotracer uptake by the liver with excretion into the biliary system. Gallbladder activity is identified by 20 minutes with progressive accumulation. Radiotracer excretion into the small bowel is identified during the second hour of imaging.  IMPRESSION: Patent cystic duct without evidence of acute cholecystitis.   Electronically Signed   By: Logan Bores   On: 08/11/2014 11:49   Ct Abdomen Pelvis W Contrast  08/10/2014   CLINICAL DATA:  Subacute onset of mid and upper abdominal pain for 2 weeks. Throat burning. Nausea. Decreased appetite and oral intake. Initial encounter.  EXAM: CT ABDOMEN AND PELVIS WITH CONTRAST  TECHNIQUE: Multidetector CT imaging of the abdomen and pelvis was performed using the standard protocol following bolus  administration of intravenous contrast.  CONTRAST:  141mL OMNIPAQUE IOHEXOL 300 MG/ML  SOLN  COMPARISON:  CT of the pelvis performed 02/19/2014, and pelvic ultrasound performed 10/14/2003  FINDINGS: Small bilateral pleural effusions are noted. The heart is mildly enlarged. Scattered coronary artery calcifications are seen.  Pericholecystic fluid is noted, and a stone is noted within the gallbladder. This raises question for mild acute cholecystitis. Would correlate for associated symptoms. A 1.2 cm cystic focus within the left hepatic lobe is of uncertain significance. Trace ascites is noted tracking about the inferior tip of the liver.  The visualized portions of the spleen are grossly unremarkable. The pancreas demonstrates minimal surrounding soft tissue stranding, though this remains nonspecific. There is mild stranding within the small bowel mesentery, with scattered associated borderline prominent nodes. The adrenal glands are unremarkable in appearance.  Mild  calcification adjacent to the left adrenal gland may be vascular in nature.  Mild bilateral renal atrophy is noted. Nonspecific perinephric stranding is noted bilaterally. A few scattered small bilateral renal cysts are seen. There is no evidence of hydronephrosis. No renal or ureteral stones are identified.  The small bowel is unremarkable in appearance. The stomach is within normal limits. No acute vascular abnormalities are seen. Scattered calcification is noted along the abdominal aorta.  The appendix is normal in caliber, without evidence of appendicitis. Trace fluid is noted along the paracolic gutters bilaterally, more prominent on the right. Minimal diverticulosis is noted at the proximal sigmoid colon. The colon is otherwise unremarkable.  The bladder is decompressed and not well seen. A small amount of free fluid is noted within the pelvis. The uterus is grossly unremarkable in appearance. The ovaries are relatively symmetric. No suspicious  adnexal masses are seen. No inguinal lymphadenopathy is seen.  No acute osseous abnormalities are identified. There is mild chronic loss of height at the superior endplate of vertebral body L4. The patient is status post decompression at L3 and L4.  IMPRESSION: 1. Pericholecystic fluid noted, with cholelithiasis. Though this is nonspecific given underlying trace ascites, it raises question for mild acute cholecystitis. Would correlate for associated symptoms. 2. Nonspecific minimal soft tissue stranding about the pancreas, without definite evidence for pancreatitis. Mild stranding is also noted within the adjacent small bowel mesentery, with associated borderline prominent nodes. This may reflect the patient's baseline. 3. Trace ascites noted about the liver and within the pelvis. 4. Small bilateral pleural effusions noted. 5. Mild cardiomegaly. Scattered coronary artery calcifications seen. 6. Nonspecific 1.2 cm cystic focus within the left hepatic lobe. 7. Mild bilateral renal atrophy noted. Scattered bilateral renal cysts seen. 8. Scattered calcification along the abdominal aorta. 9. Minimal diverticulosis of the proximal sigmoid colon, without evidence of diverticulitis. 10. Mild chronic loss of height at the superior endplate of vertebral body L4.   Electronically Signed   By: Garald Balding M.D.   On: 08/10/2014 22:30   US Abdomen Limited Ruq  08/11/2014   CLINICAL DATA:  81 year old female with right upper quadrant abdominal pain  EXAM: US ABDOMEN LIMITED - RIGHT UPPER QUADRANT  COMPARISON:  CT dated 08/10/2014  FINDINGS: Gallbladder:  There is a 1.2 cm stone in the gallbladder. There is thickening of the anterior gallbladder wall measuring up to 1 cm. No significant pericholecystic fluid. No tenderness was elicited over the gallbladder area during scanning.  Common bile duct:  Diameter: 4 mm  Liver:  No focal lesion identified. Within normal limits in parenchymal echogenicity.  A right-sided pleural  effusion is partially visualized.  IMPRESSION: Cholelithiasis with thickened anterior gallbladder wall. Findings may represent acute cholecystitis. A hepatobiliary scintigraphy may provide better evaluation of the gallbladder if an acute cholecystitis is clinically suspected.   Electronically Signed   By: Anner Crete M.D.   On: 08/11/2014 00:05    Scheduled Meds: . aspirin EC  81 mg Oral Daily  . chlorhexidine  15 mL Mouth/Throat BID  . heparin  5,000 Units Subcutaneous 3 times per day  . levothyroxine  25 mcg Oral QAC breakfast  . pantoprazole (PROTONIX) IV  40 mg Intravenous BID  . sucralfate  1 g Oral TID WC & HS   Continuous Infusions: . sodium chloride 100 mL/hr (08/11/14 1351)    Principal Problem:   Acute cholecystitis Active Problems:   Abdominal pain    Time spent: 30 min  Janece Canterbury  [facility] Pager [phone]. If 7PM-7AM, please contact night-coverage at [email or web address], password [facility] 08/11/2014, 4:57 PM  LOS: 0 days

## 2014-08-11 NOTE — ED Notes (Signed)
Surgeon at bedside.  

## 2014-08-11 NOTE — ED Notes (Signed)
Per charge do not take pt upstairs until speaking with floor

## 2014-08-11 NOTE — H&P (Signed)
[facility] History and Physical  Alexandra [name] [id number] DOB: October 18, 1927 DOA: 08/10/2014  Referring physician: EDP PCP:  Melinda Crutch, MD   Chief Complaint: Abdominal pain   HPI: Alexandra [name] is a 81 y.o. female who presents to the ED with a 2 week history of abdominal pain.  Pain is located in her periumbilical area and RUQ, eating makes it worse.  She has had poor PO intake over the past couple of days due to constant pain and associated nausea.  She has nausea but no vomiting.  No fever, no cough, no dysuria, no diarrhea.  Symptoms have been worsening.  Review of Systems: Systems reviewed.  As above, otherwise negative  Past Medical History  Diagnosis Date  . Arthritis   . Headache(784.0)   . Anxiety   . Depression   . Asthma   . CAP (community acquired pneumonia) 12/19/2012  . Intermittent LBBB (left bundle branch block) 12/21/2012  . Obesity   . Peripheral neuropathy   . Sacroiliitis, not elsewhere classified   . Osteopenia   . Hypercholesteremia   . Neuropathy   . Trigger finger     [name]  . Chest pain     NM stress test 05/2011 Normal  . DOE (dyspnea on exertion)     No SOB at rest  . Fatigue     excertional  . breast ca dx'd 2000    left  . Essential (primary) hypertension 04/02/2014  . Adult hypothyroidism 04/02/2014  . Hereditary and idiopathic peripheral neuropathy 07/02/2014  . Leg weakness    Past Surgical History  Procedure Laterality Date  . Leg surgery    . Breast lumpectomy      breast cancer  . Lumbar laminectomy    . Achilles tendon surgery    . Tonsillectomy    . Vesicovaginal fistula closure w/ tah      DC hysterectomy  . Polypectomy     Social History:  reports that she has never smoked. She has never used smokeless tobacco. She reports that she does not drink alcohol or use illicit drugs.  Allergies  Allergen Reactions  . Cymbalta [Duloxetine Hcl] Other (See Comments)    Depression  . Lyrica [Pregabalin] Other (See  Comments)    Blurry vision    Family History  Problem Relation Age of Onset  . Pneumonia Mother 68    cause of death  . Diabetes Brother   . Alzheimer's disease Sister   . CVA Daughter     01/21/09  . Pancreatic cancer Father   . Pneumonia Mother      Prior to Admission medications   Medication Sig Start Date End Date Taking? Authorizing Provider  alendronate (FOSAMAX) 70 MG tablet Take 70 mg by mouth every Saturday. Take with a full glass of water on an empty stomach.   Yes Historical Provider, MD  ALPRAZolam (XANAX) 0.25 MG tablet TK 1 T PO TID PRN anxiety 07/19/14  Yes Historical Provider, MD  aspirin EC 81 MG tablet Take 81 mg by mouth daily.   Yes Historical Provider, MD  b complex vitamins tablet Take 1 tablet by mouth daily.   Yes Historical Provider, MD  Calcium-Vitamin D (CALTRATE 600 PLUS-VIT D PO) Take 1 tablet by mouth 2 (two) times daily.   Yes Historical Provider, MD  cholecalciferol (VITAMIN D) 1000 UNITS tablet Take 1,000 Units by mouth daily.   Yes Historical Provider, MD  levothyroxine (SYNTHROID, LEVOTHROID) 25 MCG tablet Take 25 mcg by  mouth daily before breakfast.   Yes Historical Provider, MD  Multiple Vitamin (MULITIVITAMIN WITH MINERALS) TABS Take 1 tablet by mouth daily.   Yes Historical Provider, MD  naproxen sodium (ANAPROX) 220 MG tablet Take 220 mg by mouth 2 (two) times daily as needed (for arthritis pain).    Yes Historical Provider, MD  nitroGLYCERIN (NITROSTAT) 0.4 MG SL tablet Place 0.4 mg under the tongue every 5 (five) minutes as needed for chest pain.   Yes Historical Provider, MD  omega-3 acid ethyl esters (LOVAZA) 1 G capsule Take 1 g by mouth daily.   Yes Historical Provider, MD  traMADol (ULTRAM) 50 MG tablet Take 1 tablet (50 mg total) by mouth every 6 (six) hours as needed for moderate pain. 04/01/14  Yes Dickie La, MD  VENTOLIN HFA 108 (90 BASE) MCG/ACT inhaler INhaLe 2 PufFS by mouth every 4 hours as needed for shortness of breath or wheezing  07/19/14  Yes Historical Provider, MD   Physical Exam: Filed Vitals:   08/11/14 0000  BP: 127/78  Pulse:   Temp:   Resp: 10    BP 127/78 mmHg  Pulse 98  Temp(Src) 98.9 F (37.2 C) (Oral)  Resp 10  SpO2 99%  General Appearance:    Alert, oriented, no distress, appears stated age  Head:    Normocephalic, atraumatic  Eyes:    PERRL, EOMI, sclera non-icteric        Nose:   Nares without drainage or epistaxis. Mucosa, turbinates normal  Throat:   Moist mucous membranes. Oropharynx without erythema or exudate.  Neck:   Supple. No carotid bruits.  No thyromegaly.  No lymphadenopathy.   Back:     No CVA tenderness, no spinal tenderness  Lungs:     Clear to auscultation bilaterally, without wheezes, rhonchi or rales  Chest wall:    No tenderness to palpitation  Heart:   Regular, tachycardic, without murmurs, gallops, rubs  Abdomen:     Soft, non-tender, nondistended, normal bowel sounds, no organomegaly  Genitalia:    deferred  Rectal:    deferred  Extremities:   No clubbing, cyanosis or edema.  Pulses:   2+ and symmetric all extremities  Skin:   Skin color, texture, turgor normal, no rashes or lesions  Lymph nodes:   Cervical, supraclavicular, and axillary nodes normal  Neurologic:   CNII-XII intact. Normal strength, sensation and reflexes      throughout    Labs on Admission:  Basic Metabolic Panel:  Recent Labs Lab 08/10/14 2024  NA 139  K 4.6  CL 107  CO2 25  GLUCOSE 116*  BUN 29*  CREATININE 1.18*  CALCIUM 9.2   Liver Function Tests:  Recent Labs Lab 08/10/14 2024  AST 30  ALT 21  ALKPHOS 46  BILITOT 0.9  PROT 7.9  ALBUMIN 4.0    Recent Labs Lab 08/10/14 2024  LIPASE 14*   No results for input(s): AMMONIA in the last 168 hours. CBC:  Recent Labs Lab 08/10/14 2024  WBC 6.7  NEUTROABS 4.2  HGB 11.2*  HCT 34.9*  MCV 91.8  PLT 193   Cardiac Enzymes: No results for input(s): CKTOTAL, CKMB, CKMBINDEX, TROPONINI in the last 168 hours.  BNP  (last 3 results) No results for input(s): PROBNP in the last 8760 hours. CBG: No results for input(s): GLUCAP in the last 168 hours.  Radiological Exams on Admission: Dg Chest 2 View  08/10/2014   CLINICAL DATA:  Intermittent shortness of breath  EXAM:  CHEST  2 VIEW  COMPARISON:  April 27, 2013  FINDINGS: There is persistent elevation the left hemidiaphragm. There is patchy left base atelectasis. There is no appreciable edema or consolidation. The heart is slightly enlarged but stable. The pulmonary vascularity is normal. No adenopathy. There are surgical clips in the left breast region. There is degenerative change in thoracic spine.  IMPRESSION: Stable elevation left hemidiaphragm with left base atelectasis. No edema or consolidation. Heart prominent but stable. Pulmonary vascularity within normal limits.   Electronically Signed   By: Lowella Grip III M.D.   On: 08/10/2014 20:24   Ct Abdomen Pelvis W Contrast  08/10/2014   CLINICAL DATA:  Subacute onset of mid and upper abdominal pain for 2 weeks. Throat burning. Nausea. Decreased appetite and oral intake. Initial encounter.  EXAM: CT ABDOMEN AND PELVIS WITH CONTRAST  TECHNIQUE: Multidetector CT imaging of the abdomen and pelvis was performed using the standard protocol following bolus administration of intravenous contrast.  CONTRAST:  142mL OMNIPAQUE IOHEXOL 300 MG/ML  SOLN  COMPARISON:  CT of the pelvis performed 02/19/2014, and pelvic ultrasound performed 10/14/2003  FINDINGS: Small bilateral pleural effusions are noted. The heart is mildly enlarged. Scattered coronary artery calcifications are seen.  Pericholecystic fluid is noted, and a stone is noted within the gallbladder. This raises question for mild acute cholecystitis. Would correlate for associated symptoms. A 1.2 cm cystic focus within the left hepatic lobe is of uncertain significance. Trace ascites is noted tracking about the inferior tip of the liver.  The visualized portions of the  spleen are grossly unremarkable. The pancreas demonstrates minimal surrounding soft tissue stranding, though this remains nonspecific. There is mild stranding within the small bowel mesentery, with scattered associated borderline prominent nodes. The adrenal glands are unremarkable in appearance.  Mild calcification adjacent to the left adrenal gland may be vascular in nature.  Mild bilateral renal atrophy is noted. Nonspecific perinephric stranding is noted bilaterally. A few scattered small bilateral renal cysts are seen. There is no evidence of hydronephrosis. No renal or ureteral stones are identified.  The small bowel is unremarkable in appearance. The stomach is within normal limits. No acute vascular abnormalities are seen. Scattered calcification is noted along the abdominal aorta.  The appendix is normal in caliber, without evidence of appendicitis. Trace fluid is noted along the paracolic gutters bilaterally, more prominent on the right. Minimal diverticulosis is noted at the proximal sigmoid colon. The colon is otherwise unremarkable.  The bladder is decompressed and not well seen. A small amount of free fluid is noted within the pelvis. The uterus is grossly unremarkable in appearance. The ovaries are relatively symmetric. No suspicious adnexal masses are seen. No inguinal lymphadenopathy is seen.  No acute osseous abnormalities are identified. There is mild chronic loss of height at the superior endplate of vertebral body L4. The patient is status post decompression at L3 and L4.  IMPRESSION: 1. Pericholecystic fluid noted, with cholelithiasis. Though this is nonspecific given underlying trace ascites, it raises question for mild acute cholecystitis. Would correlate for associated symptoms. 2. Nonspecific minimal soft tissue stranding about the pancreas, without definite evidence for pancreatitis. Mild stranding is also noted within the adjacent small bowel mesentery, with associated borderline  prominent nodes. This may reflect the patient's baseline. 3. Trace ascites noted about the liver and within the pelvis. 4. Small bilateral pleural effusions noted. 5. Mild cardiomegaly. Scattered coronary artery calcifications seen. 6. Nonspecific 1.2 cm cystic focus within the left hepatic lobe. 7. Mild  bilateral renal atrophy noted. Scattered bilateral renal cysts seen. 8. Scattered calcification along the abdominal aorta. 9. Minimal diverticulosis of the proximal sigmoid colon, without evidence of diverticulitis. 10. Mild chronic loss of height at the superior endplate of vertebral body L4.   Electronically Signed   By: Garald Balding M.D.   On: 08/10/2014 22:30   US Abdomen Limited Ruq  08/11/2014   CLINICAL DATA:  81 year old female with right upper quadrant abdominal pain  EXAM: US ABDOMEN LIMITED - RIGHT UPPER QUADRANT  COMPARISON:  CT dated 08/10/2014  FINDINGS: Gallbladder:  There is a 1.2 cm stone in the gallbladder. There is thickening of the anterior gallbladder wall measuring up to 1 cm. No significant pericholecystic fluid. No tenderness was elicited over the gallbladder area during scanning.  Common bile duct:  Diameter: 4 mm  Liver:  No focal lesion identified. Within normal limits in parenchymal echogenicity.  A right-sided pleural effusion is partially visualized.  IMPRESSION: Cholelithiasis with thickened anterior gallbladder wall. Findings may represent acute cholecystitis. A hepatobiliary scintigraphy may provide better evaluation of the gallbladder if an acute cholecystitis is clinically suspected.   Electronically Signed   By: Anner Crete M.D.   On: 08/11/2014 00:05    EKG: Independently reviewed.  Assessment/Plan Principal Problem:   Acute cholecystitis Active Problems:   Abdominal pain   1. Abdominal pain - suspicious for Acute Cholecystitis 1. EDP spoke with surgery who recommended HIDA scan 1. HIDA scan ordered for AM 2. Patient NPO 2. EDP started patient on empiric  rocephin given pericholicystic fluid, pain, and tachycardia 1. Continue rocephin, and add empiric flagyl 3. IVF, getting 1L bolus now then 100 cc/hr 4. Monitor mild tachycardia. 5. Dilaudid PRN pain.  EDP spoke with Dr. Zella Richer (per him, get HIDA scan).  Code Status: Full  Family Communication: Family at bedside Disposition Plan: Admit to obs   Time spent: 50 min  GARDNER, JARED M. [facility] Pager [phone]  If 7AM-7PM, please contact the day team taking care of the patient [email or web address] Password [facility] 08/11/2014, 12:42 AM

## 2014-08-12 DIAGNOSIS — E039 Hypothyroidism, unspecified: Secondary | ICD-10-CM

## 2014-08-12 DIAGNOSIS — R109 Unspecified abdominal pain: Secondary | ICD-10-CM

## 2014-08-12 LAB — CBC
HEMATOCRIT: 33.9 % — AB (ref 36.0–46.0)
Hemoglobin: 10.7 g/dL — ABNORMAL LOW (ref 12.0–15.0)
MCH: 29.3 pg (ref 26.0–34.0)
MCHC: 31.6 g/dL (ref 30.0–36.0)
MCV: 92.9 fL (ref 78.0–100.0)
Platelets: 148 10*3/uL — ABNORMAL LOW (ref 150–400)
RBC: 3.65 MIL/uL — AB (ref 3.87–5.11)
RDW: 14.5 % (ref 11.5–15.5)
WBC: 5.9 10*3/uL (ref 4.0–10.5)

## 2014-08-12 LAB — COMPREHENSIVE METABOLIC PANEL
ALT: 24 U/L (ref 14–54)
AST: 30 U/L (ref 15–41)
Albumin: 3.6 g/dL (ref 3.5–5.0)
Alkaline Phosphatase: 40 U/L (ref 38–126)
Anion gap: 9 (ref 5–15)
BUN: 27 mg/dL — ABNORMAL HIGH (ref 6–20)
CO2: 20 mmol/L — ABNORMAL LOW (ref 22–32)
Calcium: 8.4 mg/dL — ABNORMAL LOW (ref 8.9–10.3)
Chloride: 111 mmol/L (ref 101–111)
Creatinine, Ser: 1.09 mg/dL — ABNORMAL HIGH (ref 0.44–1.00)
GFR, EST AFRICAN AMERICAN: 52 mL/min — AB (ref >60)
GFR, EST NON AFRICAN AMERICAN: 45 mL/min — AB (ref >60)
GLUCOSE: 73 mg/dL (ref 65–99)
Potassium: 4.4 mmol/L (ref 3.5–5.1)
SODIUM: 140 mmol/L (ref 135–145)
Total Bilirubin: 0.8 mg/dL (ref 0.3–1.2)
Total Protein: 6.8 g/dL (ref 6.5–8.1)

## 2014-08-12 MED ORDER — PANTOPRAZOLE SODIUM 40 MG PO TBEC
40.0000 mg | DELAYED_RELEASE_TABLET | Freq: Two times a day (BID) | ORAL | Status: DC
  Administered 2014-08-12 – 2014-08-16 (×7): 40 mg via ORAL
  Filled 2014-08-12 (×9): qty 1

## 2014-08-12 NOTE — Progress Notes (Signed)
Subjective: She feels better, stomach is "gurgley," with clears, but no nausea or pain.   She has been advanced to a soft diet.  Will be available at lunch. Objective: Vital signs in last 24 hours: Temp:  [97.8 F (36.6 C)-98.2 F (36.8 C)] 98.1 F (36.7 C) (07/25 0500) Pulse Rate:  [93-98] 98 (07/25 0500) Resp:  [16-18] 16 (07/25 0500) BP: (115-125)/(64-74) 125/74 mmHg (07/25 0500) SpO2:  [96 %-100 %] 96 % (07/25 0500) Last BM Date: 08/11/14 PO none recorded yesterday, 360 PO today  Regular diet ordered this AM BM x 3 yesterday one today Afebrile, VSS. WBC and LFT's are normal HIDA scan - Negative [place] abd: Cholelithiasis with thickened anterior gallbladder wall. Findings may represent acute cholecystitis. A hepatobiliary scintigraphy may provide better evaluation of the gallbladder if an acute cholecystitis is clinically suspected.   Intake/Output from previous day: 07/24 0701 - 07/25 0700 In: 2400 [I.V.:2400] Out: 700 [Urine:700] Intake/Output this shift: Total I/O In: 360 [P.O.:360] Out: -   General appearance: alert, cooperative and no distress GI: soft, non-tender; bowel sounds normal; no masses,  no organomegaly  Lab Results:   Recent Labs  08/11/14 0810 08/12/14 0502  WBC 6.1 5.9  HGB 10.2* 10.7*  HCT 32.5* 33.9*  PLT 174 148*    BMET  Recent Labs  08/11/14 0810 08/12/14 0502  NA 137 140  K 4.6 4.4  CL 105 111  CO2 25 20*  GLUCOSE 95 73  BUN 29* 27*  CREATININE 1.25* 1.09*  CALCIUM 8.5* 8.4*   PT/INR No results for input(s): LABPROT, INR in the last 72 hours.   Recent Labs Lab 08/10/14 2024 08/11/14 0810 08/12/14 0502  AST 30 29 30   ALT 21 25 24   ALKPHOS 46 41 40  BILITOT 0.9 0.9 0.8  PROT 7.9 6.8 6.8  ALBUMIN 4.0 3.5 3.6     Lipase     Component Value Date/Time   LIPASE 14* 08/10/2014 2024     Studies/Results: Dg Chest 2 View  08/10/2014   CLINICAL DATA:  Intermittent shortness of breath  EXAM: CHEST  2 VIEW   COMPARISON:  April 27, 2013  FINDINGS: There is persistent elevation the left hemidiaphragm. There is patchy left base atelectasis. There is no appreciable edema or consolidation. The heart is slightly enlarged but stable. The pulmonary vascularity is normal. No adenopathy. There are surgical clips in the left breast region. There is degenerative change in thoracic spine.  IMPRESSION: Stable elevation left hemidiaphragm with left base atelectasis. No edema or consolidation. Heart prominent but stable. Pulmonary vascularity within normal limits.   Electronically Signed   By: Lowella Grip III M.D.   On: 08/10/2014 20:24   Nm Hepatobiliary Liver Func  08/11/2014   CLINICAL DATA:  Predominantly lower abdominal pain and nausea for 3 weeks. No vomiting. Possible acute cholecystitis.  EXAM: NUCLEAR MEDICINE HEPATOBILIARY IMAGING  TECHNIQUE: Sequential images of the abdomen were obtained out to 60 minutes following intravenous administration of radiopharmaceutical.  RADIOPHARMACEUTICALS:  5.0 mCi Tc-22m  Choletec IV  COMPARISON:  Right upper quadrant ultrasound 08/10/2014  FINDINGS: There is prompt radiotracer uptake by the liver with excretion into the biliary system. Gallbladder activity is identified by 20 minutes with progressive accumulation. Radiotracer excretion into the small bowel is identified during the second hour of imaging.  IMPRESSION: Patent cystic duct without evidence of acute cholecystitis.   Electronically Signed   By: Logan Bores   On: 08/11/2014 11:49   Ct Abdomen Pelvis W  Contrast  08/10/2014   CLINICAL DATA:  Subacute onset of mid and upper abdominal pain for 2 weeks. Throat burning. Nausea. Decreased appetite and oral intake. Initial encounter.  EXAM: CT ABDOMEN AND PELVIS WITH CONTRAST  TECHNIQUE: Multidetector CT imaging of the abdomen and pelvis was performed using the standard protocol following bolus administration of intravenous contrast.  CONTRAST:  1106mL OMNIPAQUE IOHEXOL 300  MG/ML  SOLN  COMPARISON:  CT of the pelvis performed 02/19/2014, and pelvic ultrasound performed 10/14/2003  FINDINGS: Small bilateral pleural effusions are noted. The heart is mildly enlarged. Scattered coronary artery calcifications are seen.  Pericholecystic fluid is noted, and a stone is noted within the gallbladder. This raises question for mild acute cholecystitis. Would correlate for associated symptoms. A 1.2 cm cystic focus within the left hepatic lobe is of uncertain significance. Trace ascites is noted tracking about the inferior tip of the liver.  The visualized portions of the spleen are grossly unremarkable. The pancreas demonstrates minimal surrounding soft tissue stranding, though this remains nonspecific. There is mild stranding within the small bowel mesentery, with scattered associated borderline prominent nodes. The adrenal glands are unremarkable in appearance.  Mild calcification adjacent to the left adrenal gland may be vascular in nature.  Mild bilateral renal atrophy is noted. Nonspecific perinephric stranding is noted bilaterally. A few scattered small bilateral renal cysts are seen. There is no evidence of hydronephrosis. No renal or ureteral stones are identified.  The small bowel is unremarkable in appearance. The stomach is within normal limits. No acute vascular abnormalities are seen. Scattered calcification is noted along the abdominal aorta.  The appendix is normal in caliber, without evidence of appendicitis. Trace fluid is noted along the paracolic gutters bilaterally, more prominent on the right. Minimal diverticulosis is noted at the proximal sigmoid colon. The colon is otherwise unremarkable.  The bladder is decompressed and not well seen. A small amount of free fluid is noted within the pelvis. The uterus is grossly unremarkable in appearance. The ovaries are relatively symmetric. No suspicious adnexal masses are seen. No inguinal lymphadenopathy is seen.  No acute osseous  abnormalities are identified. There is mild chronic loss of height at the superior endplate of vertebral body L4. The patient is status post decompression at L3 and L4.  IMPRESSION: 1. Pericholecystic fluid noted, with cholelithiasis. Though this is nonspecific given underlying trace ascites, it raises question for mild acute cholecystitis. Would correlate for associated symptoms. 2. Nonspecific minimal soft tissue stranding about the pancreas, without definite evidence for pancreatitis. Mild stranding is also noted within the adjacent small bowel mesentery, with associated borderline prominent nodes. This may reflect the patient's baseline. 3. Trace ascites noted about the liver and within the pelvis. 4. Small bilateral pleural effusions noted. 5. Mild cardiomegaly. Scattered coronary artery calcifications seen. 6. Nonspecific 1.2 cm cystic focus within the left hepatic lobe. 7. Mild bilateral renal atrophy noted. Scattered bilateral renal cysts seen. 8. Scattered calcification along the abdominal aorta. 9. Minimal diverticulosis of the proximal sigmoid colon, without evidence of diverticulitis. 10. Mild chronic loss of height at the superior endplate of vertebral body L4.   Electronically Signed   By: Garald Balding M.D.   On: 08/10/2014 22:30   US Abdomen Limited Ruq  08/11/2014   CLINICAL DATA:  81 year old female with right upper quadrant abdominal pain  EXAM: US ABDOMEN LIMITED - RIGHT UPPER QUADRANT  COMPARISON:  CT dated 08/10/2014  FINDINGS: Gallbladder:  There is a 1.2 cm stone in the gallbladder. There  is thickening of the anterior gallbladder wall measuring up to 1 cm. No significant pericholecystic fluid. No tenderness was elicited over the gallbladder area during scanning.  Common bile duct:  Diameter: 4 mm  Liver:  No focal lesion identified. Within normal limits in parenchymal echogenicity.  A right-sided pleural effusion is partially visualized.  IMPRESSION: Cholelithiasis with thickened anterior  gallbladder wall. Findings may represent acute cholecystitis. A hepatobiliary scintigraphy may provide better evaluation of the gallbladder if an acute cholecystitis is clinically suspected.   Electronically Signed   By: Anner Crete M.D.   On: 08/11/2014 00:05    Medications: . aspirin EC  81 mg Oral Daily  . chlorhexidine  15 mL Mouth/Throat BID  . heparin  5,000 Units Subcutaneous 3 times per day  . levothyroxine  25 mcg Oral QAC breakfast  . pantoprazole  40 mg Oral BID  . sucralfate  1 g Oral TID WC & HS    Assessment/Plan Periumbilical pain with cholelithiasis Chronic kidney disease, stage III Anemia  Antibiotics:  2 days of rocephin, 1 day of Flagyl DVT:  Heparin    Plan:  Continue to advance diet and see how she does.    LOS: 1 day    Alexandra [name] 08/12/2014

## 2014-08-12 NOTE — Care Management Important Message (Signed)
Important Message  Patient Details  Name: FLANNERY SMITHART MRN: [id number] Date of Birth: August 19, 1927   Medicare Important Message Given:  Yes-second notification given    Camillo Flaming 08/12/2014, 1:43 [place] Message  Patient Details  Name: NATALLIA KAPNER MRN: [id number] Date of Birth: 12/26/1927   Medicare Important Message Given:  Yes-second notification given    Camillo Flaming 08/12/2014, 1:43 PM

## 2014-08-12 NOTE — Progress Notes (Signed)
Patient ID: Alexandra [name], female   DOB: 01-23-1927, 81 y.o.   MRN: [id number] [facility] PROGRESS NOTE  Alexandra [name] [id number] DOB: [birth year] DOA: 08/10/2014 PCP:  Melinda Crutch, MD  Brief narrative:    81 y.o. female who presents to the ED with a 2 week history of abdominal pain. Pain is located in her periumbilical area and RUQ, eating makes it worse. She has had poor PO intake over the past couple of days due to constant pain and associated nausea. She has nausea but no vomiting. No fever, no cough, no dysuria, no diarrhea. Symptoms have been worsening.  Anticipated discharge: 7/26 if she tolerates solid food.  Assessment/Plan:    Principal Problem: Abdominal pain - Initial thought was tha she may have had acute cholecystitis and she was cipro adn flagyl - HIDA scan was negative - Her diet is being advanced to as tolerated - Appreciate surgery following.   Active Problems: CKD stage 3, creatinine stable - Minimize nephrotoxins - Creatinine improved with IV fluids  Anemia of chronic disease  - Secondary to CKD - Hemoglobin is 10.47  Hypothyroidism - Continue synthroid  GERD - Continue sucralfate and PPI therapy     DVT Prophylaxis  - Heparin subQ ordered    Code Status: Full.  Family Communication:  plan of care discussed with the patient Disposition Plan: Home likely 7/26 if she tolerates solid food    IV access:  Peripheral IV  Procedures and diagnostic studies:    Dg Chest 2 View 08/10/2014  Stable elevation left hemidiaphragm with left base atelectasis. No edema or consolidation. Heart prominent but stable. Pulmonary vascularity within normal limits.   Electronically Signed   By: Lowella Grip III M.D.   On: 08/10/2014 20:24   Nm Hepatobiliary Liver Func 08/11/2014  Patent cystic duct without evidence of acute cholecystitis.   Electronically Signed   By: Logan Bores   On: 08/11/2014 11:49   Ct Abdomen Pelvis W Contrast 08/10/2014 1.  Pericholecystic fluid noted, with cholelithiasis. Though this is nonspecific given underlying trace ascites, it raises question for mild acute cholecystitis. Would correlate for associated symptoms. 2. Nonspecific minimal soft tissue stranding about the pancreas, without definite evidence for pancreatitis. Mild stranding is also noted within the adjacent small bowel mesentery, with associated borderline prominent nodes. This may reflect the patient's baseline. 3. Trace ascites noted about the liver and within the pelvis. 4. Small bilateral pleural effusions noted. 5. Mild cardiomegaly. Scattered coronary artery calcifications seen. 6. Nonspecific 1.2 cm cystic focus within the left hepatic lobe. 7. Mild bilateral renal atrophy noted. Scattered bilateral renal cysts seen. 8. Scattered calcification along the abdominal aorta. 9. Minimal diverticulosis of the proximal sigmoid colon, without evidence of diverticulitis. 10. Mild chronic loss of height at the superior endplate of vertebral body L4.   Electronically Signed   By: Garald Balding M.D.   On: 08/10/2014 22:30   US Abdomen Limited Ruq 08/11/2014 Cholelithiasis with thickened anterior gallbladder wall. Findings may represent acute cholecystitis. A hepatobiliary scintigraphy may provide better evaluation of the gallbladder if an acute cholecystitis is clinically suspected.      Medical Consultants:  Surgery  Other Consultants:  None   IAnti-Infectives:   Ceftriaxone and flagyl 7/24 only    Leisa Lenz, MD  [facility] Pager [phone]  Time spent in minutes: 25 minutes  If 7PM-7AM, please contact night-coverage [email or web address] Password [email or web address] 08/12/2014, 7:23 PM   LOS: 1 day    HPI/Subjective: No acute  overnight events. Patient reports feeling pain in lower abdomen. No vomiting.  Objective: Filed Vitals:   08/11/14 1445 08/11/14 2121 08/12/14 0500 08/12/14 1452  BP: 121/65 115/64 125/74 123/69  Pulse: 97 93 98 109  Temp: 98.2  F (36.8 C) 97.8 F (36.6 C) 98.1 F (36.7 C) 98 F (36.7 C)  TempSrc: Oral Oral Oral Oral  Resp: 18 18 16 18   Height:      Weight:      SpO2: 100% 98% 96% 97%    Intake/Output Summary (Last 24 hours) at 08/12/14 1923 Last data filed at 08/12/14 1725  Gross per 24 hour  Intake 2976.67 ml  Output    200 ml  Net 2776.67 ml    Exam:   General:  Pt is alert, follows commands appropriately, not in acute distress  Cardiovascular: Regular rate and rhythm, S1/S2 (+)  Respiratory: Clear to auscultation bilaterally, no wheezing, no crackles, no rhonchi  Abdomen: Soft, non tender, non distended, bowel sounds present  Extremities: No edema, pulses DP and PT palpable bilaterally  Neuro: Grossly nonfocal  Data Reviewed: Basic Metabolic Panel:  Recent Labs Lab 08/10/14 2024 08/11/14 0810 08/12/14 0502  NA 139 137 140  K 4.6 4.6 4.4  CL 107 105 111  CO2 25 25 20*  GLUCOSE 116* 95 73  BUN 29* 29* 27*  CREATININE 1.18* 1.25* 1.09*  CALCIUM 9.2 8.5* 8.4*   Liver Function Tests:  Recent Labs Lab 08/10/14 2024 08/11/14 0810 08/12/14 0502  AST 30 29 30   ALT 21 25 24   ALKPHOS 46 41 40  BILITOT 0.9 0.9 0.8  PROT 7.9 6.8 6.8  ALBUMIN 4.0 3.5 3.6    Recent Labs Lab 08/10/14 2024  LIPASE 14*   No results for input(s): AMMONIA in the last 168 hours. CBC:  Recent Labs Lab 08/10/14 2024 08/11/14 0810 08/12/14 0502  WBC 6.7 6.1 5.9  NEUTROABS 4.2  --   --   HGB 11.2* 10.2* 10.7*  HCT 34.9* 32.5* 33.9*  MCV 91.8 93.4 92.9  PLT 193 174 148*   Cardiac Enzymes: No results for input(s): CKTOTAL, CKMB, CKMBINDEX, TROPONINI in the last 168 hours. BNP: Invalid input(s): POCBNP CBG: No results for input(s): GLUCAP in the last 168 hours.  No results found for this or any previous visit (from the past 240 hour(s)).   Scheduled Meds: . aspirin EC  81 mg Oral Daily  . chlorhexidine  15 mL Mouth/Throat BID  . heparin  5,000 Units Subcutaneous 3 times per day  .  levothyroxine  25 mcg Oral QAC breakfast  . pantoprazole  40 mg Oral BID  . sucralfate  1 g Oral TID WC & HS   Continuous Infusions: . sodium chloride 100 mL/hr at 08/12/14 1019

## 2014-08-12 NOTE — Evaluation (Signed)
Physical Therapy Evaluation Patient Details Name: KARALYN SANTOSO MRN: [id number] DOB: 07-10-1927 Today's Date: 08/12/2014   History of Present Illness  81 yo female admitted with abdominal pain. Hx of LBBB, HTN, falls, peripheral neuropathy, obesity, osteopenia. Pt is from home alone  Clinical Impression  Limited eval due to pt's drowsiness-likely from meds. Min assist to stand and pivot x2, recliner<>bsc with quad cane. Pt very unsteady. Unable to safely attempt ambulation at this time. At this time (based on performance during this eval), recommend HHPT and 24 hour supervision/assist. Will have to assess ambulation on next visit. Also, recommend nursing assist pt with ambulation during hospital stay.     Follow Up Recommendations Home health PT;Supervision/Assistance - 24 hour (at this time)    Equipment Recommendations  None recommended by PT    Recommendations for Other Services       Precautions / Restrictions Precautions Precautions: Fall Restrictions Weight Bearing Restrictions: No      Mobility  Bed Mobility               General bed mobility comments: pt oob in recliner  Transfers Overall transfer level: Needs assistance Equipment used: Quad cane Transfers: Sit to/from [facility] Sit to Stand: Min assist Stand pivot transfers: Min assist       General transfer comment: Assist to rise, stabilize, maneuver to and from [facility] safely. Increased time.   Ambulation/Gait             General Gait Details: Pt unable at time of eval-likely due to meds (pt reports receiving Paxil before PT arrived)  Stairs            Wheelchair Mobility    Modified Rankin (Stroke Patients Only)       Balance Overall balance assessment: Needs assistance;History of Falls         Standing balance support: During functional activity Standing balance-Leahy Scale: Fair                               Pertinent Vitals/Pain Pain  Assessment: No/denies pain    Home Living Family/patient expects to be discharged to:: Private residence Living Arrangements: Alone   Type of Home: House Home Access: Stairs to enter   Technical brewer of Steps: 2 Home Layout: Two level Home Equipment: Cane - quad      Prior Function Level of Independence: Independent with assistive device(s)               Hand Dominance        Extremity/Trunk Assessment   Upper Extremity Assessment: Generalized weakness           Lower Extremity Assessment: Generalized weakness      Cervical / Trunk Assessment: Normal  Communication   Communication:  (difficulty today due to meds)  Cognition Arousal/Alertness: Lethargic;Suspect due to medications Behavior During Therapy: [facility] for tasks assessed/performed Overall Cognitive Status: Within Functional Limits for tasks assessed                      General Comments      Exercises        Assessment/Plan    PT Assessment Patient needs continued PT services  PT Diagnosis Difficulty walking;Generalized weakness   PT Problem List Decreased strength;Decreased balance;Decreased activity tolerance;Decreased mobility;Decreased cognition  PT Treatment Interventions DME instruction;Gait training;Functional mobility training;Therapeutic activities;Patient/family education;Therapeutic exercise;Balance training   PT Goals (Current goals can be  found in the Care Plan section) Acute Rehab PT Goals Patient Stated Goal: none stated PT Goal Formulation: With patient Time For Goal Achievement: 09/02/14 Potential to Achieve Goals: Good    Frequency Min 3X/week   Barriers to discharge        Co-evaluation               End of Session Equipment Utilized During Treatment: Gait belt Activity Tolerance:  (Session limited by drowsiness) Patient left: in chair;with call bell/phone within reach           Time: [id number] PT Time Calculation (min) (ACUTE ONLY):  16 min   Charges:   PT Evaluation $Initial PT Evaluation Tier I: 1 Procedure     PT G Codes:        Weston Anna, MPT Pager: [phone]

## 2014-08-13 ENCOUNTER — Other Ambulatory Visit: Payer: Self-pay

## 2014-08-13 ENCOUNTER — Encounter (HOSPITAL_COMMUNITY): Payer: Self-pay | Admitting: Internal Medicine

## 2014-08-13 ENCOUNTER — Encounter (HOSPITAL_COMMUNITY)
Admission: EM | Disposition: A | Discharge status: Home Health | Payer: Self-pay | Source: Home / Self Care | Attending: Internal Medicine

## 2014-08-13 ENCOUNTER — Inpatient Hospital Stay (HOSPITAL_COMMUNITY): Payer: Commercial Managed Care - HMO | Admitting: Anesthesiology

## 2014-08-13 DIAGNOSIS — N183 Chronic kidney disease, stage 3 unspecified: Secondary | ICD-10-CM

## 2014-08-13 DIAGNOSIS — J189 Pneumonia, unspecified organism: Secondary | ICD-10-CM | POA: Diagnosis not present

## 2014-08-13 DIAGNOSIS — E039 Hypothyroidism, unspecified: Secondary | ICD-10-CM | POA: Diagnosis present

## 2014-08-13 DIAGNOSIS — I472 Ventricular tachycardia: Secondary | ICD-10-CM | POA: Diagnosis not present

## 2014-08-13 DIAGNOSIS — D638 Anemia in other chronic diseases classified elsewhere: Secondary | ICD-10-CM | POA: Diagnosis present

## 2014-08-13 DIAGNOSIS — K8000 Calculus of gallbladder with acute cholecystitis without obstruction: Secondary | ICD-10-CM | POA: Diagnosis not present

## 2014-08-13 DIAGNOSIS — I5021 Acute systolic (congestive) heart failure: Secondary | ICD-10-CM | POA: Diagnosis not present

## 2014-08-13 HISTORY — DX: Chronic kidney disease, stage 3 unspecified: N18.30

## 2014-08-13 HISTORY — PX: CHOLECYSTECTOMY: SHX55

## 2014-08-13 HISTORY — DX: Anemia in other chronic diseases classified elsewhere: D63.8

## 2014-08-13 LAB — SURGICAL PCR SCREEN
MRSA, PCR: NEGATIVE
Staphylococcus aureus: NEGATIVE

## 2014-08-13 LAB — COMPREHENSIVE METABOLIC PANEL
ALK PHOS: 40 U/L (ref 38–126)
ALT: 24 U/L (ref 14–54)
AST: 26 U/L (ref 15–41)
Albumin: 3.4 g/dL — ABNORMAL LOW (ref 3.5–5.0)
Anion gap: 7 (ref 5–15)
BUN: 33 mg/dL — AB (ref 6–20)
CHLORIDE: 109 mmol/L (ref 101–111)
CO2: 23 mmol/L (ref 22–32)
Calcium: 8.3 mg/dL — ABNORMAL LOW (ref 8.9–10.3)
Creatinine, Ser: 1.27 mg/dL — ABNORMAL HIGH (ref 0.44–1.00)
GFR calc non Af Amer: 37 mL/min — ABNORMAL LOW (ref >60)
GFR, EST AFRICAN AMERICAN: 43 mL/min — AB (ref >60)
Glucose, Bld: 110 mg/dL — ABNORMAL HIGH (ref 65–99)
POTASSIUM: 4.5 mmol/L (ref 3.5–5.1)
Sodium: 139 mmol/L (ref 135–145)
Total Bilirubin: 0.7 mg/dL (ref 0.3–1.2)
Total Protein: 6.7 g/dL (ref 6.5–8.1)

## 2014-08-13 LAB — CBC
HCT: 33.2 % — ABNORMAL LOW (ref 36.0–46.0)
HEMOGLOBIN: 10.4 g/dL — AB (ref 12.0–15.0)
MCH: 29.7 pg (ref 26.0–34.0)
MCHC: 31.3 g/dL (ref 30.0–36.0)
MCV: 94.9 fL (ref 78.0–100.0)
Platelets: 162 10*3/uL (ref 150–400)
RBC: 3.5 MIL/uL — ABNORMAL LOW (ref 3.87–5.11)
RDW: 14.8 % (ref 11.5–15.5)
WBC: 5.8 10*3/uL (ref 4.0–10.5)

## 2014-08-13 LAB — H.PYLORI ANTIGEN, STOOL: HPYLORAGSTL: NEGATIVE

## 2014-08-13 LAB — TROPONIN I

## 2014-08-13 SURGERY — LAPAROSCOPIC CHOLECYSTECTOMY WITH INTRAOPERATIVE CHOLANGIOGRAM
Anesthesia: General | Site: Abdomen

## 2014-08-13 MED ORDER — LIDOCAINE HCL 1 % IJ SOLN
INTRAMUSCULAR | Status: AC
  Filled 2014-08-13: qty 20

## 2014-08-13 MED ORDER — HYDROMORPHONE HCL 1 MG/ML IJ SOLN
0.5000 mg | INTRAMUSCULAR | Status: DC | PRN
  Administered 2014-08-13 – 2014-08-14 (×4): 0.5 mg via INTRAVENOUS
  Filled 2014-08-13 (×4): qty 1

## 2014-08-13 MED ORDER — PROMETHAZINE HCL 25 MG/ML IJ SOLN: 6.2500 mg | INTRAMUSCULAR | Status: DC | PRN

## 2014-08-13 MED ORDER — SUCCINYLCHOLINE CHLORIDE 20 MG/ML IJ SOLN
INTRAMUSCULAR | Status: DC | PRN
Start: 2014-08-13 — End: 2014-08-13
  Administered 2014-08-13: 100 mg via INTRAVENOUS

## 2014-08-13 MED ORDER — FENTANYL CITRATE (PF) 100 MCG/2ML IJ SOLN
25.0000 ug | INTRAMUSCULAR | Status: DC | PRN
  Administered 2014-08-13 (×2): 25 ug via INTRAVENOUS

## 2014-08-13 MED ORDER — GLYCOPYRROLATE 0.2 MG/ML IJ SOLN
INTRAMUSCULAR | Status: DC | PRN
  Administered 2014-08-13: 0.6 mg via INTRAVENOUS

## 2014-08-13 MED ORDER — CEFTRIAXONE SODIUM IN DEXTROSE 40 MG/ML IV SOLN
2.0000 g | INTRAVENOUS | Status: AC
  Administered 2014-08-13: 2 g via INTRAVENOUS
  Filled 2014-08-13: qty 50

## 2014-08-13 MED ORDER — LACTATED RINGERS IV SOLN
INTRAVENOUS | Status: DC
  Administered 2014-08-13: 17:00:00 via INTRAVENOUS

## 2014-08-13 MED ORDER — PROPOFOL 10 MG/ML IV BOLUS
INTRAVENOUS | Status: DC | PRN
  Administered 2014-08-13: 20 mg via INTRAVENOUS
  Administered 2014-08-13: 100 mg via INTRAVENOUS

## 2014-08-13 MED ORDER — ONDANSETRON HCL 4 MG/2ML IJ SOLN
INTRAMUSCULAR | Status: DC | PRN
  Administered 2014-08-13: 4 mg via INTRAVENOUS

## 2014-08-13 MED ORDER — DEXTROSE 5 % IV SOLN
INTRAVENOUS | Status: AC
  Filled 2014-08-13: qty 2

## 2014-08-13 MED ORDER — DILTIAZEM HCL 25 MG/5ML IV SOLN
5.0000 mg | Freq: Once | INTRAVENOUS | Status: AC
  Administered 2014-08-13: 5 mg via INTRAVENOUS
  Filled 2014-08-13: qty 5

## 2014-08-13 MED ORDER — FENTANYL CITRATE (PF) 100 MCG/2ML IJ SOLN
INTRAMUSCULAR | Status: AC
  Filled 2014-08-13: qty 2

## 2014-08-13 MED ORDER — LACTATED RINGERS IR SOLN
Status: DC | PRN
Start: 2014-08-13 — End: 2014-08-13
  Administered 2014-08-13: 1

## 2014-08-13 MED ORDER — PROPOFOL 10 MG/ML IV BOLUS
INTRAVENOUS | Status: AC
  Filled 2014-08-13: qty 20

## 2014-08-13 MED ORDER — ROCURONIUM BROMIDE 100 MG/10ML IV SOLN
INTRAVENOUS | Status: DC | PRN
  Administered 2014-08-13: 20 mg via INTRAVENOUS

## 2014-08-13 MED ORDER — MEPERIDINE HCL 50 MG/ML IJ SOLN: 6.2500 mg | INTRAMUSCULAR | Status: DC | PRN

## 2014-08-13 MED ORDER — LACTATED RINGERS IV SOLN
INTRAVENOUS | Status: DC
  Administered 2014-08-13: 1000 mL via INTRAVENOUS
  Filled 2014-08-13: qty 1000

## 2014-08-13 MED ORDER — BUPIVACAINE-EPINEPHRINE (PF) 0.25% -1:200000 IJ SOLN
INTRAMUSCULAR | Status: AC
  Filled 2014-08-13: qty 30

## 2014-08-13 MED ORDER — BUPIVACAINE-EPINEPHRINE (PF) 0.25% -1:200000 IJ SOLN
INTRAMUSCULAR | Status: DC | PRN
  Administered 2014-08-13: 10 mL

## 2014-08-13 MED ORDER — FENTANYL CITRATE (PF) 250 MCG/5ML IJ SOLN
INTRAMUSCULAR | Status: DC | PRN
  Administered 2014-08-13: 25 ug via INTRAVENOUS
  Administered 2014-08-13: 50 ug via INTRAVENOUS
  Administered 2014-08-13: 25 ug via INTRAVENOUS

## 2014-08-13 MED ORDER — DEXAMETHASONE SODIUM PHOSPHATE 10 MG/ML IJ SOLN
INTRAMUSCULAR | Status: DC | PRN
  Administered 2014-08-13: 10 mg via INTRAVENOUS

## 2014-08-13 MED ORDER — DEXAMETHASONE SODIUM PHOSPHATE 10 MG/ML IJ SOLN
INTRAMUSCULAR | Status: AC
  Filled 2014-08-13: qty 1

## 2014-08-13 MED ORDER — ONDANSETRON HCL 4 MG/2ML IJ SOLN
INTRAMUSCULAR | Status: AC
  Filled 2014-08-13: qty 2

## 2014-08-13 MED ORDER — GLYCOPYRROLATE 0.2 MG/ML IJ SOLN
INTRAMUSCULAR | Status: AC
  Filled 2014-08-13: qty 3

## 2014-08-13 MED ORDER — OXYCODONE-ACETAMINOPHEN 5-325 MG PO TABS
1.0000 | ORAL_TABLET | ORAL | Status: DC | PRN
  Administered 2014-08-14: 2 via ORAL
  Administered 2014-08-14: 1 via ORAL
  Administered 2014-08-15: 2 via ORAL
  Filled 2014-08-13: qty 1
  Filled 2014-08-13: qty 2
  Filled 2014-08-13: qty 1
  Filled 2014-08-13: qty 2

## 2014-08-13 MED ORDER — NEOSTIGMINE METHYLSULFATE 10 MG/10ML IV SOLN
INTRAVENOUS | Status: DC | PRN
  Administered 2014-08-13: 4 mg via INTRAVENOUS

## 2014-08-13 SURGICAL SUPPLY — 35 items
APPLIER CLIP ROT 10 11.4 M/L (STAPLE) ×2
APR CLP MED LRG 11.4X10 (STAPLE) ×1
BAG SPEC RTRVL LRG 6X4 10 (ENDOMECHANICALS) ×1
CABLE HIGH FREQUENCY MONO STRZ (ELECTRODE) ×1 IMPLANT
CHLORAPREP W/TINT 26ML (MISCELLANEOUS) ×2 IMPLANT
CLIP APPLIE ROT 10 11.4 M/L (STAPLE) ×1 IMPLANT
CLIP LIGATING HEMO O LOK GREEN (MISCELLANEOUS) IMPLANT
COVER MAYO STAND STRL (DRAPES) IMPLANT
COVER SURGICAL LIGHT HANDLE (MISCELLANEOUS) ×2 IMPLANT
DECANTER SPIKE VIAL GLASS SM (MISCELLANEOUS) ×2 IMPLANT
DRAPE C-ARM 42X120 X-RAY (DRAPES) IMPLANT
DRAPE LAPAROSCOPIC ABDOMINAL (DRAPES) ×2 IMPLANT
ELECT PENCIL ROCKER SW 15FT (MISCELLANEOUS) ×1 IMPLANT
ELECT REM PT RETURN 9FT ADLT (ELECTROSURGICAL) ×2
ELECTRODE REM PT RTRN 9FT ADLT (ELECTROSURGICAL) ×1 IMPLANT
GLOVE BIO SURGEON STRL SZ 6 (GLOVE) ×2 IMPLANT
GLOVE INDICATOR 6.5 STRL GRN (GLOVE) ×2 IMPLANT
GOWN STRL REUS W/TWL 2XL LVL3 (GOWN DISPOSABLE) ×2 IMPLANT
GOWN STRL REUS W/TWL XL LVL3 (GOWN DISPOSABLE) ×4 IMPLANT
HEMOSTAT SNOW SURGICEL 2X4 (HEMOSTASIS) IMPLANT
HEMOSTAT SURGICEL 4X8 (HEMOSTASIS) ×1 IMPLANT
KIT BASIN OR (CUSTOM PROCEDURE TRAY) ×2 IMPLANT
LIQUID BAND (GAUZE/BANDAGES/DRESSINGS) IMPLANT
POUCH SPECIMEN RETRIEVAL 10MM (ENDOMECHANICALS) ×2 IMPLANT
SCISSORS LAP 5X35 DISP (ENDOMECHANICALS) ×2 IMPLANT
SET CHOLANGIOGRAPH MIX (MISCELLANEOUS) IMPLANT
SET IRRIG TUBING LAPAROSCOPIC (IRRIGATION / IRRIGATOR) ×2 IMPLANT
SLEEVE XCEL OPT CAN 5 100 (ENDOMECHANICALS) ×2 IMPLANT
SUT MNCRL AB 4-0 PS2 18 (SUTURE) ×2 IMPLANT
TOWEL OR 17X26 10 PK STRL BLUE (TOWEL DISPOSABLE) ×2 IMPLANT
TOWEL OR NON WOVEN STRL DISP B (DISPOSABLE) ×2 IMPLANT
TRAY LAPAROSCOPIC (CUSTOM PROCEDURE TRAY) ×2 IMPLANT
TROCAR BLADELESS OPT 5 100 (ENDOMECHANICALS) ×2 IMPLANT
TROCAR XCEL BLUNT TIP 100MML (ENDOMECHANICALS) ×2 IMPLANT
TROCAR XCEL NON-BLD 11X100MML (ENDOMECHANICALS) ×2 IMPLANT

## 2014-08-13 NOTE — Anesthesia Postprocedure Evaluation (Signed)
  Anesthesia Post-op Note  Patient: Alexandra [name]  Procedure(s) Performed: Procedure(s) (LRB): LAPAROSCOPIC CHOLECYSTECTOMY (N/A)  Patient Location: PACU  Anesthesia Type: General  Level of Consciousness: awake and alert   Airway and Oxygen Therapy: Patient Spontanous Breathing  Post-op Pain: mild  Post-op Assessment: Post-op Vital signs reviewed, Patient's Cardiovascular Status Stable, Respiratory Function Stable, Patent Airway and No signs of Nausea or vomiting  Last Vitals:  Filed Vitals:   08/13/14 1733  BP:   Pulse: 102  Temp:   Resp: 18    Post-op Vital Signs: stable   Complications: No apparent anesthesia complications  Brief run of SVT post-op. HR 160's. Converted spontaneously. Stable. Will obtain 12 lead EKG and let Dr. Barry Dienes and medicine sevice know.

## 2014-08-13 NOTE — Progress Notes (Signed)
PT Cancellation Note  Patient Details Name: Alexandra [name] MRN: [id number] DOB: Feb 22, 1927   Cancelled Treatment:    Reason Eval/Treat Not Completed: Patient at procedure or test/unavailable--pt in surgery. Will check back another day.    Weston Anna, MPT Pager: [phone]

## 2014-08-13 NOTE — Transfer of Care (Signed)
Immediate Anesthesia Transfer of Care Note  Patient: Alexandra [name]  Procedure(s) Performed: Procedure(s): LAPAROSCOPIC CHOLECYSTECTOMY (N/A)  Patient Location: PACU  Anesthesia Type:General  Level of Consciousness: awake, oriented and patient cooperative  Airway & Oxygen Therapy: Patient Spontanous Breathing and Patient connected to face mask oxygen  Post-op Assessment: Report given to RN and Post -op Vital signs reviewed and stable  Post vital signs: Reviewed and stable  Last Vitals:  Filed Vitals:   08/13/14 1330  BP: 131/77  Pulse: 102  Temp: 36.8 C  Resp: 18    Complications: No apparent anesthesia complications

## 2014-08-13 NOTE — Op Note (Signed)
Laparoscopic Cholecystectomy with IOC Procedure Note  Indications: This patient presents with acute cholecystitis and will undergo laparoscopic cholecystectomy.  Pre-operative Diagnosis: Acute cholecystitis  Post-operative Diagnosis: Same  Surgeon: Stark Klein   Anesthesia: General endotracheal anesthesia and local  ASA Class: 2  Procedure Details  The patient was seen again in the Holding Room. The risks, benefits, complications, treatment options, and expected outcomes were discussed with the patient. The possibilities of  bleeding, recurrent infection, damage to nearby structures, the need for additional procedures, failure to diagnose a condition, the possible need to convert to an open procedure, and creating a complication requiring transfusion or operation were discussed with the patient. The likelihood of improving the patient's symptoms with return to their baseline status is good.    The patient and/or family concurred with the proposed plan, giving informed consent. The site of surgery properly noted. The patient was taken to Operating Room, and the procedure verified as Laparoscopic Cholecystectomy with Intraoperative Cholangiogram. A Time Out was held and the above information confirmed.  Prior to the induction of general anesthesia, antibiotic prophylaxis was administered. General endotracheal anesthesia was then administered and tolerated well. After the induction, the abdomen was prepped with Chloraprep and draped in the sterile fashion. The patient was positioned in the supine position.  Local anesthetic agent was injected into the skin near the umbilicus and an incision made. We dissected down to the abdominal fascia with blunt dissection.  The fascia was incised vertically and we entered the peritoneal cavity bluntly.  A pursestring suture of 0-Vicryl was placed around the fascial opening.  The Hasson cannula was inserted and secured with the stay suture.  Pneumoperitoneum  was then created with CO2 and tolerated well without any adverse changes in the patient's vital signs. An 11-mm port was placed in the subxiphoid position.  Two 5-mm ports were placed in the right upper quadrant. All skin incisions were infiltrated with a local anesthetic agent before making the incision and placing the trocars.   We positioned the patient in reverse Trendelenburg, tilted slightly to the patient's left.  The gallbladder was identified, the fundus grasped and retracted cephalad. Adhesions were lysed bluntly and with the electrocautery where indicated, taking care not to injure any adjacent organs or viscus. The infundibulum was grasped and retracted laterally, exposing the peritoneum overlying the triangle of Calot. This was then divided and exposed in a blunt fashion. A critical view of the cystic duct and cystic artery was obtained.  The cystic duct was clearly identified and bluntly dissected circumferentially. The cystic duct was ligated with a clip distally, three clips proximally, and divided. The cystic artery was identified, dissected free, ligated with clips and divided as well.   The gallbladder was dissected from the liver bed in retrograde fashion with the electrocautery. The gallbladder was removed and placed in an Endocatch bag.  The gallbladder and Endocatch bag were then removed through the umbilical port site.  The liver bed was irrigated and inspected. Hemostasis was achieved with the electrocautery. Copious irrigation was utilized and was repeatedly aspirated until clear.  There was a small superficial vein on the liver that oozed a bit that required several additional clips. We applied surgicel and pressure for 3 minutes.    We again inspected the right upper quadrant for hemostasis.  Pneumoperitoneum was released as we removed the trocars.   The pursestring suture was used to close the umbilical fascia.  4-0 Monocryl was used to close the skin.   The skin  was cleaned and  dry, and Dermabond was applied. The patient was then extubated and brought to the recovery room in stable condition. Instrument, sponge, and needle counts were correct at closure and at the conclusion of the case.   Findings: Early acute cholecystitis.    Estimated Blood Loss: min         Drains: none          Specimens: Gallbladder to pathology       Complications: None; patient tolerated the procedure well.         Disposition: PACU - hemodynamically stable.         Condition: stable

## 2014-08-13 NOTE — Progress Notes (Signed)
Patient states she would like to see a priest before surgery.  Consult placed. Family to have priest come to visit

## 2014-08-13 NOTE — Progress Notes (Signed)
Family priest at bedside with patient.

## 2014-08-13 NOTE — Progress Notes (Addendum)
Patient ID: Alexandra [name], female   DOB: 08-12-1927, 81 y.o.   MRN: [id number] [facility] PROGRESS NOTE  Alexandra [name] [id number] DOB: 12/24/1927 DOA: 08/10/2014 PCP:  Melinda Crutch, MD  Brief narrative:    81 y.o. female who presents to the ED with a 2 week history of abdominal pain mostly in periumbilical area and RUQ, worse with eating. She has had poor PO intake over the past couple of days due to constant pain and associated nausea but no vomiting. No fever, no cough, no dysuria, no diarrhea. Symptoms have been worsening. Her studies so far did not reveal acute findings. Surgery has seen the pt in consultation and due to worsening symptoms surgery recommended cholecystectomy and pt agreeable to it.   Anticipated discharge: plan for cholecystectomy today.   Assessment/Plan:    Principal Problem: Abdominal pain, right upper quadrant - Initial thought was tha she may have had acute cholecystitis and she was cipro ad flagyl - HIDA scan was negative - Pain is still there in RUQ - Surgery plans to do cholecystectomy today   Active Problems: Right shoulder pain - Seems to be radiating from right abdomen - Pt declined to have x rays for evaluation; she says pain is chronic   CKD stage 3 - Creatinine stable at 1.27 - Baseline creatinine is 1.25 (about 5 months ago)  Anemia of chronic disease  - Secondary to chronic kidney disease  - Hemoglobin is 10.4  Hypothyroidism - Continue synthroid  GERD - Continue sucralfate and PPI therapy     DVT Prophylaxis  - Heparin subQ ordered while pt in hospital    Code Status: Full.  Family Communication:  plan of care discussed with the patient and her daughter at the bedside  Disposition Plan: Plan for surgery today    IV access:  Peripheral IV  Procedures and diagnostic studies:    Dg Chest 2 View 08/10/2014  Stable elevation left hemidiaphragm with left base atelectasis. No edema or consolidation. Heart prominent but  stable. Pulmonary vascularity within normal limits.   Electronically Signed   By: Lowella Grip III M.D.   On: 08/10/2014 20:24   Nm Hepatobiliary Liver Func 08/11/2014  Patent cystic duct without evidence of acute cholecystitis.   Electronically Signed   By: Logan Bores   On: 08/11/2014 11:49   Ct Abdomen Pelvis W Contrast 08/10/2014 1. Pericholecystic fluid noted, with cholelithiasis. Though this is nonspecific given underlying trace ascites, it raises question for mild acute cholecystitis. Would correlate for associated symptoms. 2. Nonspecific minimal soft tissue stranding about the pancreas, without definite evidence for pancreatitis. Mild stranding is also noted within the adjacent small bowel mesentery, with associated borderline prominent nodes. This may reflect the patient's baseline. 3. Trace ascites noted about the liver and within the pelvis. 4. Small bilateral pleural effusions noted. 5. Mild cardiomegaly. Scattered coronary artery calcifications seen. 6. Nonspecific 1.2 cm cystic focus within the left hepatic lobe. 7. Mild bilateral renal atrophy noted. Scattered bilateral renal cysts seen. 8. Scattered calcification along the abdominal aorta. 9. Minimal diverticulosis of the proximal sigmoid colon, without evidence of diverticulitis. 10. Mild chronic loss of height at the superior endplate of vertebral body L4.   Electronically Signed   By: Garald Balding M.D.   On: 08/10/2014 22:30   US Abdomen Limited Ruq 08/11/2014 Cholelithiasis with thickened anterior gallbladder wall. Findings may represent acute cholecystitis. A hepatobiliary scintigraphy may provide better evaluation of the gallbladder if an acute cholecystitis is clinically  suspected.      Medical Consultants:  Surgery  Other Consultants:  None   IAnti-Infectives:   Ceftriaxone and flagyl 7/24 only    Leisa Lenz, MD  [facility] Pager [phone]  Time spent in minutes: 25 minutes  If 7PM-7AM, please contact  night-coverage [email or web address] Password [facility] 08/13/2014, 4:55 PM   LOS: 2 days    HPI/Subjective: No acute overnight events. Patient reports shoulder pain about 3/10 and abdominal pain 6/10.  Objective: Filed Vitals:   08/13/14 0512 08/13/14 1009 08/13/14 1330 08/13/14 1456  BP: 113/65 94/79 131/77   Pulse: 103 100 102   Temp: 97.9 F (36.6 C) 97.8 F (36.6 C) 98.3 F (36.8 C)   TempSrc: Oral Oral Oral   Resp: 18 18 18    Height:      Weight:      SpO2: 95% 90% 95% 89%    Intake/Output Summary (Last 24 hours) at 08/13/14 1655 Last data filed at 08/13/14 1634  Gross per 24 hour  Intake   3200 ml  Output     50 ml  Net   3150 ml    Exam:   General:  Pt is alert, not in acute distress  Cardiovascular: Regular rate and rhythm, appreciate S1,S2  Respiratory: No wheezing, no crackles, no rhonchi  Abdomen: appreciate bowel sounds, tender in right upper quadrant, no guarding   Extremities: No leg swelling, pulses palpable   Neuro: Nonfocal  Data Reviewed: Basic Metabolic Panel:  Recent Labs Lab 08/10/14 2024 08/11/14 0810 08/12/14 0502 08/13/14 0553  NA 139 137 140 139  K 4.6 4.6 4.4 4.5  CL 107 105 111 109  CO2 25 25 20* 23  GLUCOSE 116* 95 73 110*  BUN 29* 29* 27* 33*  CREATININE 1.18* 1.25* 1.09* 1.27*  CALCIUM 9.2 8.5* 8.4* 8.3*   Liver Function Tests:  Recent Labs Lab 08/10/14 2024 08/11/14 0810 08/12/14 0502 08/13/14 0553  AST 30 29 30 26   ALT 21 25 24 24   ALKPHOS 46 41 40 40  BILITOT 0.9 0.9 0.8 0.7  PROT 7.9 6.8 6.8 6.7  ALBUMIN 4.0 3.5 3.6 3.4*    Recent Labs Lab 08/10/14 2024  LIPASE 14*   No results for input(s): AMMONIA in the last 168 hours. CBC:  Recent Labs Lab 08/10/14 2024 08/11/14 0810 08/12/14 0502 08/13/14 0553  WBC 6.7 6.1 5.9 5.8  NEUTROABS 4.2  --   --   --   HGB 11.2* 10.2* 10.7* 10.4*  HCT 34.9* 32.5* 33.9* 33.2*  MCV 91.8 93.4 92.9 94.9  PLT 193 174 148* 162   Cardiac Enzymes: No results for  input(s): CKTOTAL, CKMB, CKMBINDEX, TROPONINI in the last 168 hours. BNP: Invalid input(s): POCBNP CBG: No results for input(s): GLUCAP in the last 168 hours.  Recent Results (from the past 240 hour(s))  H.pylori Antigen, Stool     Status: None   Collection Time: 08/11/14  6:40 PM  Result Value Ref Range Status   Specimen Description STOOL  Final   Special Requests NONE  Final   H. pylori ag, stool NEGATIVE Performed at [facility]   Final   Report Status 08/13/2014 FINAL  Final  Surgical pcr screen     Status: None   Collection Time: 08/13/14  9:53 AM  Result Value Ref Range Status   MRSA, PCR NEGATIVE NEGATIVE Final   Staphylococcus aureus NEGATIVE NEGATIVE Final    Comment:        The Xpert SA Assay (FDA  approved for NASAL specimens in patients over 13 years of age), is one component of a comprehensive surveillance program.  Test performance has been validated by [facility] for patients greater than or equal to 22 year old. It is not intended to diagnose infection nor to guide or monitor treatment.      Scheduled Meds: . [MAR Hold] chlorhexidine  15 mL Mouth/Throat BID  . [MAR Hold] levothyroxine  25 mcg Oral QAC breakfast  . [MAR Hold] pantoprazole  40 mg Oral BID  . [MAR Hold] sucralfate  1 g Oral TID WC & HS   Continuous Infusions: . sodium chloride Stopped (08/13/14 1500)

## 2014-08-13 NOTE — Progress Notes (Signed)
Pt had run of V tach HR currently around low 110's Order placed for trop x 3, 12 lead EKG Give 1 dose 5 mg IV Cardizem since SBP 150's  Will follow up on those studies and consult cardio if either v tach persists or if elevated troponins.  Leisa Lenz [facility] [id number]

## 2014-08-13 NOTE — Anesthesia Procedure Notes (Signed)
Procedure Name: Intubation Date/Time: 08/13/2014 3:47 PM Performed by: Dione Booze Pre-anesthesia Checklist: Patient identified, Emergency Drugs available, Suction available and Patient being monitored Patient Re-evaluated:Patient Re-evaluated prior to inductionOxygen Delivery Method: Circle system utilized Preoxygenation: Pre-oxygenation with 100% oxygen Intubation Type: IV induction Laryngoscope Size: Mac and 4 Grade View: Grade I Tube type: Oral Tube size: 7.5 mm Number of attempts: 1 Airway Equipment and Method: Stylet Secured at: 21 cm Tube secured with: Tape Dental Injury: Teeth and Oropharynx as per pre-operative assessment

## 2014-08-13 NOTE — Progress Notes (Signed)
  Subjective: Pain Right shoulder and some in RUQ, no nausea or vomiting with PO, ate fish last PM  Nothing except water since.  She used dilaudid twice yesterday and early this AM.  Complaining now, says she just doesn't feel better.  Objective: Vital signs in last 24 hours: Temp:  [97.9 F (36.6 C)-98.3 F (36.8 C)] 97.9 F (36.6 C) (07/26 0512) Pulse Rate:  [103-109] 103 (07/26 0512) Resp:  [16-18] 18 (07/26 0512) BP: (113-138)/(65-85) 113/65 mmHg (07/26 0512) SpO2:  [95 %-100 %] 95 % (07/26 0512) Last BM Date: 08/11/14 Afebrile,VSS some tachycardia LFT's normal, creatinine up some 1.27 WBC 5.8 Intake/Output from previous day: 07/25 0701 - 07/26 0700 In: 3000 [P.O.:600; I.V.:2400] Out: 200 [Urine:200] Intake/Output this shift:    General appearance: alert, cooperative, no distress and feels bad, still having pain. Resp: clear to auscultation bilaterally GI: soft, tender RUQ to palpation, + BS.  no distension.  Lab Results:   Recent Labs  08/12/14 0502 08/13/14 0553  WBC 5.9 5.8  HGB 10.7* 10.4*  HCT 33.9* 33.2*  PLT 148* 162    BMET  Recent Labs  08/12/14 0502 08/13/14 0553  NA 140 139  K 4.4 4.5  CL 111 109  CO2 20* 23  GLUCOSE 73 110*  BUN 27* 33*  CREATININE 1.09* 1.27*  CALCIUM 8.4* 8.3*   PT/INR No results for input(s): LABPROT, INR in the last 72 hours.   Recent Labs Lab 08/10/14 2024 08/11/14 0810 08/12/14 0502 08/13/14 0553  AST 30 29 30 26   ALT 21 25 24 24   ALKPHOS 46 41 40 40  BILITOT 0.9 0.9 0.8 0.7  PROT 7.9 6.8 6.8 6.7  ALBUMIN 4.0 3.5 3.6 3.4*     Lipase     Component Value Date/Time   LIPASE 14* 08/10/2014 2024     Studies/Results: Nm Hepatobiliary Liver Func  08/11/2014   CLINICAL DATA:  Predominantly lower abdominal pain and nausea for 3 weeks. No vomiting. Possible acute cholecystitis.  EXAM: NUCLEAR MEDICINE HEPATOBILIARY IMAGING  TECHNIQUE: Sequential images of the abdomen were obtained out to 60 minutes  following intravenous administration of radiopharmaceutical.  RADIOPHARMACEUTICALS:  5.0 mCi Tc-50m  Choletec IV  COMPARISON:  Right upper quadrant ultrasound 08/10/2014  FINDINGS: There is prompt radiotracer uptake by the liver with excretion into the biliary system. Gallbladder activity is identified by 20 minutes with progressive accumulation. Radiotracer excretion into the small bowel is identified during the second hour of imaging.  IMPRESSION: Patent cystic duct without evidence of acute cholecystitis.   Electronically Signed   By: Logan Bores   On: 08/11/2014 11:49    Medications: . aspirin EC  81 mg Oral Daily  . chlorhexidine  15 mL Mouth/Throat BID  . heparin  5,000 Units Subcutaneous 3 times per day  . levothyroxine  25 mcg Oral QAC breakfast  . pantoprazole  40 mg Oral BID  . sucralfate  1 g Oral TID WC & HS   . sodium chloride 100 mL/hr at 08/13/14 0600   Assessment/Plan Periumbilical pain with cholelithiasis Chronic kidney disease, stage III Anemia  Antibiotics: 2 days of rocephin, 1 day of Flagyl; none since 08/11/14. DVT: Heparin      Plan:  NPO will discuss with Dr. Charlies Silvers and DR. Byerly.    LOS: 2 days    Ramirez Fullbright 08/13/2014

## 2014-08-13 NOTE — Anesthesia Preprocedure Evaluation (Signed)
Anesthesia Evaluation  Patient identified by MRN, date of birth, ID band Patient awake    Reviewed: Allergy & Precautions, NPO status , Patient's Chart, lab work & pertinent test results  Airway Mallampati: II  TM Distance: >3 FB Neck ROM: Full    Dental no notable dental hx.    Pulmonary neg pulmonary ROS,  breath sounds clear to auscultation  Pulmonary exam normal       Cardiovascular hypertension, Pt. on medications Normal cardiovascular examRhythm:Regular Rate:Normal  LBBB   Neuro/Psych negative neurological ROS  negative psych ROS   GI/Hepatic negative GI ROS, Neg liver ROS,   Endo/Other  negative endocrine ROS  Renal/GU negative Renal ROS  negative genitourinary   Musculoskeletal negative musculoskeletal ROS (+)   Abdominal   Peds negative pediatric ROS (+)  Hematology negative hematology ROS (+)   Anesthesia Other Findings   Reproductive/Obstetrics negative OB ROS                             Anesthesia Physical Anesthesia Plan  ASA: II  Anesthesia Plan: General   Post-op Pain Management:    Induction: Intravenous  Airway Management Planned: Oral ETT  Additional Equipment:   Intra-op Plan:   Post-operative Plan: Extubation in OR  Informed Consent: I have reviewed the patients History and Physical, chart, labs and discussed the procedure including the risks, benefits and alternatives for the proposed anesthesia with the patient or authorized representative who has indicated his/her understanding and acceptance.   Dental advisory given  Plan Discussed with: CRNA  Anesthesia Plan Comments:         Anesthesia Quick Evaluation

## 2014-08-14 ENCOUNTER — Encounter (HOSPITAL_COMMUNITY): Payer: Self-pay | Admitting: General Surgery

## 2014-08-14 DIAGNOSIS — K810 Acute cholecystitis: Secondary | ICD-10-CM

## 2014-08-14 DIAGNOSIS — D638 Anemia in other chronic diseases classified elsewhere: Secondary | ICD-10-CM

## 2014-08-14 DIAGNOSIS — E038 Other specified hypothyroidism: Secondary | ICD-10-CM

## 2014-08-14 DIAGNOSIS — N183 Chronic kidney disease, stage 3 (moderate): Secondary | ICD-10-CM

## 2014-08-14 LAB — COMPREHENSIVE METABOLIC PANEL
ALT: 32 U/L (ref 14–54)
ANION GAP: 7 (ref 5–15)
AST: 42 U/L — AB (ref 15–41)
Albumin: 3.4 g/dL — ABNORMAL LOW (ref 3.5–5.0)
Alkaline Phosphatase: 41 U/L (ref 38–126)
BILIRUBIN TOTAL: 0.6 mg/dL (ref 0.3–1.2)
BUN: 32 mg/dL — ABNORMAL HIGH (ref 6–20)
CHLORIDE: 111 mmol/L (ref 101–111)
CO2: 23 mmol/L (ref 22–32)
Calcium: 8.5 mg/dL — ABNORMAL LOW (ref 8.9–10.3)
Creatinine, Ser: 1.16 mg/dL — ABNORMAL HIGH (ref 0.44–1.00)
GFR calc Af Amer: 48 mL/min — ABNORMAL LOW (ref >60)
GFR calc non Af Amer: 42 mL/min — ABNORMAL LOW (ref >60)
Glucose, Bld: 122 mg/dL — ABNORMAL HIGH (ref 65–99)
Potassium: 4.9 mmol/L (ref 3.5–5.1)
SODIUM: 141 mmol/L (ref 135–145)
Total Protein: 6.6 g/dL (ref 6.5–8.1)

## 2014-08-14 LAB — CBC
HCT: 32.4 % — ABNORMAL LOW (ref 36.0–46.0)
Hemoglobin: 10 g/dL — ABNORMAL LOW (ref 12.0–15.0)
MCH: 28.8 pg (ref 26.0–34.0)
MCHC: 30.9 g/dL (ref 30.0–36.0)
MCV: 93.4 fL (ref 78.0–100.0)
Platelets: 139 10*3/uL — ABNORMAL LOW (ref 150–400)
RBC: 3.47 MIL/uL — ABNORMAL LOW (ref 3.87–5.11)
RDW: 14.6 % (ref 11.5–15.5)
WBC: 10.4 10*3/uL (ref 4.0–10.5)

## 2014-08-14 LAB — TROPONIN I
TROPONIN I: 0.03 ng/mL (ref <0.031)
Troponin I: 0.03 ng/mL (ref <0.031)

## 2014-08-14 NOTE — Progress Notes (Signed)
Chaplain following due to spiritual care consult - pt requesting priest.    At time of chaplain arrival, nursing reports pt is in pain and recommends returning to assess for priest later.  Chaplain will follow up later today to make referral for priest.   Creta Levin [facility]

## 2014-08-14 NOTE — Care Management Note (Signed)
Case Management Note  Patient Details  Name: Alexandra [name] MRN: [id number] Date of Birth: [birth year]  Subjective/Objective: POD#1 lap chole. SVT.PT-HHPT. Provided patient w/HHC agency list, await choice. Has cane, rw.                   Action/Plan:d/c plan home w/HHC.   Expected Discharge Date:                Expected Discharge Plan:  [place]  In-House Referral:     Discharge planning Services  CM Consult  Post Acute Care Choice:    Choice offered to:  Patient  DME Arranged:    DME Agency:     HH Arranged:    [place] Agency:     Status of Service:  In process, will continue to follow  Medicare Important Message Given:  Yes-second notification given Date Medicare IM Given:    Medicare IM give by:    Date Additional Medicare IM Given:    Additional Medicare Important Message give by:     If discussed at [place] of Stay Meetings, dates discussed:    Additional Comments:  Dessa Phi, RN 08/14/2014, 3:02 PM

## 2014-08-14 NOTE — Progress Notes (Signed)
Physical Therapy Treatment Patient Details Name: Alexandra [name] MRN: [id number] DOB: 26-Jun-1927 Today's Date: 2014/08/25    History of Present Illness 81 yo female admitted with abdominal pain. Hx of LBBB, HTN, falls, peripheral neuropathy, obesity, osteopenia. Pt is from home alone. S/P Laparoscopic cholecystectomy 7/26    PT Comments    Pt ambulated in hallway short distance due to fatigue however currently only min/guard assist overall for safety.  Pt reports family can assist at home.  Follow Up Recommendations  Home health PT;Supervision/Assistance - 24 hour     Equipment Recommendations  Rolling walker with 5" wheels    Recommendations for Other Services       Precautions / Restrictions Precautions Precautions: Fall    Mobility  Bed Mobility Overal bed mobility: Needs Assistance Bed Mobility: Supine to Sit     Supine to sit: Supervision;HOB elevated        Transfers Overall transfer level: Needs assistance Equipment used: Rolling walker (2 wheeled) Transfers: Sit to/from Stand Sit to Stand: Min guard         General transfer comment: verbal cues for hand placement  Ambulation/Gait Ambulation/Gait assistance: Min guard Ambulation Distance (Feet): 40 Feet Assistive device: Rolling walker (2 wheeled) Gait Pattern/deviations: Step-through pattern;Decreased stride length;Trunk flexed     General Gait Details: verbal cues for safe use of RW, posture, slow but steady gait   Stairs            Wheelchair Mobility    Modified Rankin (Stroke Patients Only)       Balance                                    Cognition Arousal/Alertness: Awake/alert Behavior During Therapy: WFL for tasks assessed/performed Overall Cognitive Status: Within Functional Limits for tasks assessed                      Exercises      General Comments        Pertinent Vitals/Pain Pain Assessment: 0-10 Pain Score: 5  Pain Location:  surgical site Pain Descriptors / Indicators: Sore;Tender Pain Intervention(s): Limited activity within patient's tolerance;Monitored during session;Premedicated before session;Repositioned    Home Living                      Prior Function            PT Goals (current goals can now be found in the care plan section) Progress towards PT goals: Progressing toward goals    Frequency  Min 3X/week    PT Plan Current plan remains appropriate    Co-evaluation             End of Session Equipment Utilized During Treatment: Gait belt Activity Tolerance: Patient limited by fatigue Patient left: in chair;with call bell/phone within reach     Time: 1357-1415 PT Time Calculation (min) (ACUTE ONLY): 18 min  Charges:  $Gait Training: 8-22 mins                    G Codes:      Gennesis Hogland,KATHrine E 2014-08-25, 3:02 PM Carmelia Bake, PT, DPT Aug 25, 2014 Pager: [phone]

## 2014-08-14 NOTE — Progress Notes (Signed)
1 Day Post-Op  Subjective: Doing better.  Pain controlled.  No n/v with clear liquids.    Objective: Vital signs in last 24 hours: Temp:  [97.6 F (36.4 C)-98.3 F (36.8 C)] 97.7 F (36.5 C) (07/27 0427) Pulse Rate:  [93-109] 95 (07/27 0427) Resp:  [12-23] 20 (07/27 0427) BP: (94-164)/(64-135) 127/73 mmHg (07/27 0427) SpO2:  [89 %-100 %] 98 % (07/27 0427) Last BM Date: 08/14/14  Intake/Output from previous day: 07/26 0701 - 07/27 0700 In: 2578.3 [I.V.:2578.3] Out: 350 [Urine:300; Blood:50] Intake/Output this shift:    General appearance: alert, cooperative and no distress Resp: breathing comfortably GI: soft, non distended.  mild bruising at incisions.  Lab Results:   Recent Labs  08/13/14 0553 08/14/14 0558  WBC 5.8 10.4  HGB 10.4* 10.0*  HCT 33.2* 32.4*  PLT 162 139*   BMET  Recent Labs  08/13/14 0553 08/14/14 0558  NA 139 141  K 4.5 4.9  CL 109 111  CO2 23 23  GLUCOSE 110* 122*  BUN 33* 32*  CREATININE 1.27* 1.16*  CALCIUM 8.3* 8.5*   PT/INR No results for input(s): LABPROT, INR in the last 72 hours. ABG No results for input(s): PHART, HCO3 in the last 72 hours.  Invalid input(s): PCO2, PO2  Studies/Results: No results found.  Anti-infectives: Anti-infectives    Start     Dose/Rate Route Frequency Ordered Stop   08/13/14 1145  [MAR Hold]  cefTRIAXone (ROCEPHIN) 2 g in dextrose 5 % 50 mL IVPB - Premix     (MAR Hold since 08/13/14 1536)   2 g 100 mL/hr over 30 Minutes Intravenous 30 min pre-op 08/13/14 1143 08/13/14 1553   08/12/14 0015  cefTRIAXone (ROCEPHIN) 1 g in dextrose 5 % 50 mL IVPB  Status:  Discontinued     1 g 100 mL/hr over 30 Minutes Intravenous Every 24 hours 08/11/14 0037 08/11/14 0255   08/11/14 1000  cefTRIAXone (ROCEPHIN) 2 g in dextrose 5 % 50 mL IVPB - Premix  Status:  Discontinued     2 g 100 mL/hr over 30 Minutes Intravenous Every 24 hours 08/11/14 0255 08/11/14 1630   08/11/14 0045  metroNIDAZOLE (FLAGYL) IVPB 500 mg   Status:  Discontinued     500 mg 100 mL/hr over 60 Minutes Intravenous Every 8 hours 08/11/14 0037 08/11/14 1630   08/11/14 0015  cefTRIAXone (ROCEPHIN) 1 g in dextrose 5 % 50 mL IVPB     1 g 100 mL/hr over 30 Minutes Intravenous  Once 08/11/14 0014 08/11/14 0215      Assessment/Plan: s/p Procedure(s): LAPAROSCOPIC CHOLECYSTECTOMY (N/A) advance diet    LOS: 3 days    [facility] 08/14/2014

## 2014-08-14 NOTE — Progress Notes (Signed)
[facility] Progress Note   Alexandra [name] [id number] DOB: May 08, 1927 DOA: 08/10/2014 PCP:  Melinda Crutch, MD  Brief narrative: Alexandra [name] is a 81 y.o. female with history of breast cancer, peripheral neuropathy, hypothyroidism presented to the hospital with abdominal pain. She was suspected to have acute cholecystitis and therefore placed on ciprofloxacin and Flagyl. Hydroscan was negative but patient continued to have right upper quadrant pain and therefore surgery decided to take her for a laparoscopic cholecystectomy which was done on 7/26.   Subjective: Having some right upper quadrant discomfort today.  Assessment/Plan: Principal Problem:   Acute cholecystitis -See above - Laparoscopic cholecystectomy 7/26 -Has been placed on a solid diet by surgery  Active Problems: SVT - short duration after surgery-given a dose of Cardizem 5 mg IV -Currently has mild sinus tachycardia-no chest pain-no shortness of breath -Further workup needed  CKD (chronic kidney disease) stage 3, GFR 30-59 ml/min - Cr has been stable    Anemia of chronic disease - Hb stable at 10    Hypothyroidism -Continue Synthroid   Code Status: full code Family Communication: daughter at bedside Disposition Plan: home in AM DVT prophylaxis: SCds Consultants:surgery Procedures: 7/26- lap cholecystectomy   Antibiotics: Anti-infectives    Start     Dose/Rate Route Frequency Ordered Stop   08/13/14 1145  [MAR Hold]  cefTRIAXone (ROCEPHIN) 2 g in dextrose 5 % 50 mL IVPB - Premix     (MAR Hold since 08/13/14 1536)   2 g 100 mL/hr over 30 Minutes Intravenous 30 min pre-op 08/13/14 1143 08/13/14 1553   08/12/14 0015  cefTRIAXone (ROCEPHIN) 1 g in dextrose 5 % 50 mL IVPB  Status:  Discontinued     1 g 100 mL/hr over 30 Minutes Intravenous Every 24 hours 08/11/14 0037 08/11/14 0255   08/11/14 1000  cefTRIAXone (ROCEPHIN) 2 g in dextrose 5 % 50 mL IVPB - Premix  Status:  Discontinued     2 g 100  mL/hr over 30 Minutes Intravenous Every 24 hours 08/11/14 0255 08/11/14 1630   08/11/14 0045  metroNIDAZOLE (FLAGYL) IVPB 500 mg  Status:  Discontinued     500 mg 100 mL/hr over 60 Minutes Intravenous Every 8 hours 08/11/14 0037 08/11/14 1630   08/11/14 0015  cefTRIAXone (ROCEPHIN) 1 g in dextrose 5 % 50 mL IVPB     1 g 100 mL/hr over 30 Minutes Intravenous  Once 08/11/14 0014 08/11/14 0215      Objective: Filed Weights   08/11/14 0247  Weight: 90.4 kg (199 lb 4.7 oz)    Intake/Output Summary (Last 24 hours) at 08/14/14 1233 Last data filed at 08/14/14 0658  Gross per 24 hour  Intake 2178.34 ml  Output    350 ml  Net 1828.34 ml     Vitals Filed Vitals:   08/13/14 1835 08/13/14 2131 08/14/14 0130 08/14/14 0427  BP: 134/72 114/72 115/64 127/73  Pulse:  93 96 95  Temp: 97.7 F (36.5 C) 98 F (36.7 C) 97.9 F (36.6 C) 97.7 F (36.5 C)  TempSrc:  Oral Oral Oral  Resp: 16 16 20 20   Height:      Weight:      SpO2: 99% 100% 99% 98%    Exam:  General:  Pt is alert, not in acute distress  HEENT: No icterus, No thrush, oral mucosa moist  Cardiovascular: regular rate and rhythm, S1/S2 No murmur  Respiratory: clear to auscultation bilaterally   Abdomen: Soft, +Bowel sounds, mild diffuse tender, non  distended, no guarding  MSK: No LE edema, cyanosis or clubbing  Data Reviewed: Basic Metabolic Panel:  Recent Labs Lab 08/10/14 2024 08/11/14 0810 08/12/14 0502 08/13/14 0553 08/14/14 0558  NA 139 137 140 139 141  K 4.6 4.6 4.4 4.5 4.9  CL 107 105 111 109 111  CO2 25 25 20* 23 23  GLUCOSE 116* 95 73 110* 122*  BUN 29* 29* 27* 33* 32*  CREATININE 1.18* 1.25* 1.09* 1.27* 1.16*  CALCIUM 9.2 8.5* 8.4* 8.3* 8.5*   Liver Function Tests:  Recent Labs Lab 08/10/14 2024 08/11/14 0810 08/12/14 0502 08/13/14 0553 08/14/14 0558  AST 30 29 30 26  42*  ALT 21 25 24 24  32  ALKPHOS 46 41 40 40 41  BILITOT 0.9 0.9 0.8 0.7 0.6  PROT 7.9 6.8 6.8 6.7 6.6  ALBUMIN 4.0  3.5 3.6 3.4* 3.4*    Recent Labs Lab 08/10/14 2024  LIPASE 14*   No results for input(s): AMMONIA in the last 168 hours. CBC:  Recent Labs Lab 08/10/14 2024 08/11/14 0810 08/12/14 0502 08/13/14 0553 08/14/14 0558  WBC 6.7 6.1 5.9 5.8 10.4  NEUTROABS 4.2  --   --   --   --   HGB 11.2* 10.2* 10.7* 10.4* 10.0*  HCT 34.9* 32.5* 33.9* 33.2* 32.4*  MCV 91.8 93.4 92.9 94.9 93.4  PLT 193 174 148* 162 139*   Cardiac Enzymes:  Recent Labs Lab 08/13/14 1920 08/14/14 0003 08/14/14 0558  TROPONINI <0.03 0.03 0.03   BNP (last 3 results) No results for input(s): BNP in the last 8760 hours.  ProBNP (last 3 results) No results for input(s): PROBNP in the last 8760 hours.  CBG: No results for input(s): GLUCAP in the last 168 hours.  Recent Results (from the past 240 hour(s))  H.pylori Antigen, Stool     Status: None   Collection Time: 08/11/14  6:40 PM  Result Value Ref Range Status   Specimen Description STOOL  Final   Special Requests NONE  Final   H. pylori ag, stool NEGATIVE Performed at [facility]   Final   Report Status 08/13/2014 FINAL  Final  Surgical pcr screen     Status: None   Collection Time: 08/13/14  9:53 AM  Result Value Ref Range Status   MRSA, PCR NEGATIVE NEGATIVE Final   Staphylococcus aureus NEGATIVE NEGATIVE Final    Comment:        The Xpert SA Assay (FDA approved for NASAL specimens in patients over 34 years of age), is one component of a comprehensive surveillance program.  Test performance has been validated by [facility] for patients greater than or equal to 68 year old. It is not intended to diagnose infection nor to guide or monitor treatment.      Studies: No results found.  Scheduled Meds:  Scheduled Meds: . chlorhexidine  15 mL Mouth/Throat BID  . levothyroxine  25 mcg Oral QAC breakfast  . pantoprazole  40 mg Oral BID  . sucralfate  1 g Oral TID WC & HS   Continuous Infusions:    Time spent on care of  this patient: 40 min   [place], MD 08/14/2014, 12:33 PM  LOS: 3 days   [facility] Office  [phone] Pager - Text Page per [email or web address] If 7PM-7AM, please contact night-coverage [email or web address]

## 2014-08-15 ENCOUNTER — Inpatient Hospital Stay (HOSPITAL_COMMUNITY): Payer: Commercial Managed Care - HMO

## 2014-08-15 DIAGNOSIS — I10 Essential (primary) hypertension: Secondary | ICD-10-CM

## 2014-08-15 DIAGNOSIS — I471 Supraventricular tachycardia: Secondary | ICD-10-CM

## 2014-08-15 LAB — CBC
HEMATOCRIT: 33.1 % — AB (ref 36.0–46.0)
Hemoglobin: 10.2 g/dL — ABNORMAL LOW (ref 12.0–15.0)
MCH: 29.1 pg (ref 26.0–34.0)
MCHC: 30.8 g/dL (ref 30.0–36.0)
MCV: 94.3 fL (ref 78.0–100.0)
PLATELETS: 158 10*3/uL (ref 150–400)
RBC: 3.51 MIL/uL — ABNORMAL LOW (ref 3.87–5.11)
RDW: 15.1 % (ref 11.5–15.5)
WBC: 9 10*3/uL (ref 4.0–10.5)

## 2014-08-15 LAB — COMPREHENSIVE METABOLIC PANEL
ALT: 30 U/L (ref 14–54)
ANION GAP: 8 (ref 5–15)
AST: 38 U/L (ref 15–41)
Albumin: 3.7 g/dL (ref 3.5–5.0)
Alkaline Phosphatase: 42 U/L (ref 38–126)
BUN: 39 mg/dL — AB (ref 6–20)
CHLORIDE: 109 mmol/L (ref 101–111)
CO2: 22 mmol/L (ref 22–32)
CREATININE: 1.45 mg/dL — AB (ref 0.44–1.00)
Calcium: 8.6 mg/dL — ABNORMAL LOW (ref 8.9–10.3)
GFR calc non Af Amer: 32 mL/min — ABNORMAL LOW (ref >60)
GFR, EST AFRICAN AMERICAN: 37 mL/min — AB (ref >60)
GLUCOSE: 111 mg/dL — AB (ref 65–99)
POTASSIUM: 4.5 mmol/L (ref 3.5–5.1)
Sodium: 139 mmol/L (ref 135–145)
TOTAL PROTEIN: 6.8 g/dL (ref 6.5–8.1)
Total Bilirubin: 0.6 mg/dL (ref 0.3–1.2)

## 2014-08-15 MED ORDER — CARVEDILOL 3.125 MG PO TABS
3.1250 mg | ORAL_TABLET | Freq: Two times a day (BID) | ORAL | Status: DC
  Administered 2014-08-16 (×2): 3.125 mg via ORAL
  Filled 2014-08-15 (×2): qty 1

## 2014-08-15 MED ORDER — OXYCODONE-ACETAMINOPHEN 5-325 MG PO TABS: 1.0000 | ORAL_TABLET | Freq: Four times a day (QID) | ORAL | Status: DC | PRN

## 2014-08-15 MED ORDER — PERFLUTREN LIPID MICROSPHERE
1.0000 mL | INTRAVENOUS | Status: AC | PRN
  Administered 2014-08-15: 2 mL via INTRAVENOUS
  Filled 2014-08-15: qty 10

## 2014-08-15 NOTE — Care Management Important Message (Signed)
Important Message  Patient Details  Name: JANAIS DEGLER MRN: [id number] Date of Birth: [birth year]   Medicare Important Message Given:  Yes-third notification given    Camillo Flaming 08/15/2014, 1:23 [place] Message  Patient Details  Name: LANIA WENDLANDT MRN: [id number] Date of Birth: 05/21/1927   Medicare Important Message Given:  Yes-third notification given    Camillo Flaming 08/15/2014, 1:23 PM

## 2014-08-15 NOTE — Discharge Instructions (Signed)
Laparoscopic Cholecystectomy, Care After °Refer to this sheet in the next few weeks. These instructions provide you with information on caring for yourself after your procedure. Your health care provider may also give you more specific instructions. Your treatment has been planned according to current medical practices, but problems sometimes occur. Call your health care provider if you have any problems or questions after your procedure. °WHAT TO EXPECT AFTER THE PROCEDURE °After your procedure, it is typical to have the following: °· Pain at your incision sites. You will be given pain medicines to control the pain. °· Mild nausea or vomiting. This should improve after the first 24 hours. °· Bloating and possibly shoulder pain from the gas used during the procedure. This will improve after the first 24 hours. °HOME CARE INSTRUCTIONS  °· Change bandages (dressings) as directed by your health care provider. °· Keep the wound dry and clean. You may wash the wound gently with soap and water. Gently blot or dab the area dry. °· Do not take baths or use swimming pools or hot tubs for 2 weeks or until your health care provider approves. °· Only take over-the-counter or prescription medicines as directed by your health care provider. °· Continue your normal diet as directed by your health care provider. °· Do not lift anything heavier than 10 pounds (4.5 kg) until your health care provider approves. °· Do not play contact sports for 1 week or until your health care provider approves. °SEEK MEDICAL CARE IF:  °· You have redness, swelling, or increasing pain in the wound. °· You notice yellowish-white fluid (pus) coming from the wound. °· You have drainage from the wound that lasts longer than 1 day. °· You notice a bad smell coming from the wound or dressing. °· Your surgical cuts (incisions) break open. °SEEK IMMEDIATE MEDICAL CARE IF:  °· You develop a rash. °· You have difficulty breathing. °· You have chest pain. °· You  have a fever. °· You have increasing pain in the shoulders (shoulder strap areas). °· You have dizzy episodes or faint while standing. °· You have severe abdominal pain. °· You feel sick to your stomach (nauseous) or throw up (vomit) and this lasts for more than 1 day. °Document Released: 01/04/2005 Document Revised: 10/25/2012 Document Reviewed: 08/16/2012 °ExitCare® Patient Information ©2015 [employer]. This information is not intended to replace advice given to you by your health care provider. Make sure you discuss any questions you have with your health care provider. ° °CCS ______[facility] °LAPAROSCOPIC SURGERY: POST OP INSTRUCTIONS °Always review your discharge instruction sheet given to you by the facility where your surgery was performed. °IF YOU HAVE DISABILITY OR FAMILY LEAVE FORMS, YOU MUST BRING THEM TO THE OFFICE FOR PROCESSING.   °DO NOT GIVE THEM TO YOUR DOCTOR. ° °1. A prescription for pain medication may be given to you upon discharge.  Take your pain medication as prescribed, if needed.  If narcotic pain medicine is not needed, then you may take acetaminophen (Tylenol) or ibuprofen (Advil) as needed. °2. Take your usually prescribed medications unless otherwise directed. °3. If you need a refill on your pain medication, please contact your pharmacy.  They will contact our office to request authorization. Prescriptions will not be filled after 5pm or on week-ends. °4. You should follow a light diet the first few days after arrival home, such as soup and crackers, etc.  Be sure to include lots of fluids daily. °5. Most patients will experience some   swelling and bruising in the area of the incisions.  Ice packs will help.  Swelling and bruising can take several days to resolve.  °6. It is common to experience some constipation if taking pain medication after surgery.  Increasing fluid intake and taking a stool softener (such as Colace) will usually help or prevent this problem  from occurring.  A mild laxative (Milk of Magnesia or Miralax) should be taken according to package instructions if there are no bowel movements after 48 hours. °7. Unless discharge instructions indicate otherwise, you may remove your bandages 24-48 hours after surgery, and you may shower at that time.  You may have steri-strips (small skin tapes) in place directly over the incision.  These strips should be left on the skin for 7-10 days.  If your surgeon used skin glue on the incision, you may shower in 24 hours.  The glue will flake off over the next 2-3 weeks.  Any sutures or staples will be removed at the office during your follow-up visit. °8. ACTIVITIES:  You may resume regular (light) daily activities beginning the next day--such as daily self-care, walking, climbing stairs--gradually increasing activities as tolerated.  You may have sexual intercourse when it is comfortable.  Refrain from any heavy lifting or straining until approved by your doctor. °a. You may drive when you are no longer taking prescription pain medication, you can comfortably wear a seatbelt, and you can safely maneuver your car and apply brakes. °b. RETURN TO WORK:  __________________________________________________________ °9. You should see your doctor in the office for a follow-up appointment approximately 2-3 weeks after your surgery.  Make sure that you call for this appointment within a day or two after you arrive home to insure a convenient appointment time. °10. OTHER INSTRUCTIONS: __________________________________________________________________________________________________________________________ __________________________________________________________________________________________________________________________ °WHEN TO CALL YOUR DOCTOR: °1. Fever over 101.0 °2. Inability to urinate °3. Continued bleeding from incision. °4. Increased pain, redness, or drainage from the incision. °5. Increasing abdominal pain ° °The  clinic staff is available to answer your questions during regular business hours.  Please don’t hesitate to call and ask to speak to one of the nurses for clinical concerns.  If you have a medical emergency, go to the nearest emergency room or call 911.  A surgeon from [facility] is always on call at the hospital. °[address], [address], [place], [place]  [address] ? [address] [address], [place], [place]   [address] °[phone] ? [phone] ? FAX [phone] °Web site: [email or web address] °

## 2014-08-15 NOTE — Discharge Summary (Addendum)
Physician Discharge Summary  Alexandra [name] DOB: 19-Aug-1927 DOA: 08/10/2014  PCP:  Melinda Crutch, MD  Admit date: 08/10/2014 Discharge date: 08/17/2014  Time spent: 55 minutes  F/u  With PCP in 3 days- will need Bmet to f/u on Cr and K+ is she is taking Lasix  Discharge Condition: stable Diet recommendation: low sodium fat diet  Discharge Diagnoses:  Principal Problem:   Acute cholecystitis Active Problems:   CKD (chronic kidney disease) stage 3, GFR 30-59 ml/min   Cardiomyopathy- etiology not yet determined   HCAP (healthcare-associated pneumonia)   Essential (primary) hypertension   Anemia of chronic disease   Hypothyroidism   H/O Asthma   LBBB (left bundle branch block)   PSVT (paroxysmal supraventricular tachycardia)   History of present illness:  Alexandra [name] is a 81 y.o. female with history of breast cancer, peripheral neuropathy, hypothyroidism presented to the hospital with abdominal pain. She was suspected to have acute cholecystitis and therefore placed on ciprofloxacin and Flagyl. HIDA was negative but patient continued to have right upper quadrant pain and therefore surgery decided to take her for a laparoscopic cholecystectomy which was done on 7/26.  Hospital Course:  Principal Problem:  Acute cholecystitis -See above - Laparoscopic cholecystectomy 7/26 -Has been placed on a solid diet by surgery which she is tolerating well - Percocet PRN for pain-  Active Problems: SVT - short episodes -no chest pain-no shortness of breath - ECHO ordered to evaluate- results mentioned below  Cardiomyopathy- possibly Takastubo's - 2 D ECHO revealed an EF of 20%- suspected to have Takastubo's - her husband died last month and this may have triggered it - started on Coreg- Cardiology recommended to hold off on ARB/ACE I for now - given Lasix yesterday - is fluid overloaded but fluid is mostly in legs- CXR does not show pulm edema - discussed details about  using Lasix PRN for fluid gain- she states her legs feel heavy and she will probably take it at home- given a script for potassium as well to use with Lasix - Cardiology has told me that they will get her an appt with Dr Marlou Porch for the next week- discussed with patient.   RLL pneumonia/ HCAP - I noted that she was coughing  Up yellow sputum - she stated is was mostly in the AM- has mild dyspnea on exertion - RLL crackles heard on exam - CXR revealed RLL infiltrate- started on Doxy and Augmentin today to cover for HCAP and aspiration pneumonia which may have occurred during surgery - will give her a 7 day course  CKD (chronic kidney disease) stage 3, GFR 30-59 ml/min - Cr has been stable   Anemia of chronic disease - Hb stable at 10   Hypothyroidism -Continue Synthroid  Procedures:  Lap chole  ECHO This was a very technically difficult study. The LV was moderately dilated with EF 20%. There was diffuse severe hypokinesis with some preservation of function at the LV base (raising question of Takotsubo cardiomyopathy). The RV was poorly visualized but appeared moderately dilated with moderately decreased systolic function. Biatrial enlargement. Dilated IVC suggestive of elevated RV filling pressure. Mild MR and TR. Moderate pulmonary hypertension.   Consultations:  Surgery   Discharge Exam: Filed Weights   08/11/14 0247 08/17/14 0519  Weight: 90.4 kg (199 lb 4.7 oz) 97.7 kg (215 lb 6.2 oz)   Filed Vitals:   08/17/14 1344  BP: 118/69  Pulse: 104  Temp: 98.2 F (36.8 C)  Resp:  20    General: AAO x 3, no distress Cardiovascular: RRR, no murmurs  Respiratory: clear to auscultation bilaterally GI: soft, non-tender, non-distended, bowel sound positive  Discharge Instructions You were cared for by a hospitalist during your hospital stay. If you have any questions about your discharge medications or the care you received while you were in the hospital  after you are discharged, you can call the unit and asked to speak with the hospitalist on call if the hospitalist that took care of you is not available. Once you are discharged, your primary care physician will handle any further medical issues. Please note that NO REFILLS for any discharge medications will be authorized once you are discharged, as it is imperative that you return to your primary care physician (or establish a relationship with a primary care physician if you do not have one) for your aftercare needs so that they can reassess your need for medications and monitor your lab values.      Discharge Instructions    (HEART FAILURE PATIENTS) Call MD:  Anytime you have any of the following symptoms: 1) 3 pound weight gain in 24 hours or 5 pounds in 1 week 2) shortness of breath, with or without a dry hacking cough 3) swelling in the hands, feet or stomach 4) if you have to sleep on extra pillows at night in order to breathe.    Complete by:  As directed      Diet - low sodium heart healthy    Complete by:  As directed      Discharge instructions    Complete by:  As directed   Low fat diet     Increase activity slowly    Complete by:  As directed             Medication List    TAKE these medications        alendronate 70 MG tablet  Commonly known as:  FOSAMAX  Take 70 mg by mouth every Saturday. Take with a full glass of water on an empty stomach.     ALPRAZolam 0.25 MG tablet  Commonly known as:  XANAX  TK 1 T PO TID PRN anxiety     amoxicillin-clavulanate 875-125 MG per tablet  Commonly known as:  AUGMENTIN  Take 1 tablet by mouth 2 (two) times daily.     aspirin EC 81 MG tablet  Take 81 mg by mouth daily.     b complex vitamins tablet  Take 1 tablet by mouth daily.     CALTRATE 600 PLUS-VIT D PO  Take 1 tablet by mouth 2 (two) times daily.     carvedilol 6.25 MG tablet  Commonly known as:  COREG  Take 1 tablet (6.25 mg total) by mouth 2 (two) times daily with  a meal.     cholecalciferol 1000 UNITS tablet  Commonly known as:  VITAMIN D  Take 1,000 Units by mouth daily.     doxycycline 100 MG tablet  Commonly known as:  VIBRA-TABS  Take 1 tablet (100 mg total) by mouth every 12 (twelve) hours.     furosemide 20 MG tablet  Commonly known as:  LASIX  Take 1 tablet (20 mg total) by mouth daily as needed for fluid or edema.     levothyroxine 25 MCG tablet  Commonly known as:  SYNTHROID, LEVOTHROID  Take 25 mcg by mouth daily before breakfast.     multivitamin with minerals Tabs tablet  Take 1 tablet  by mouth daily.     naproxen sodium 220 MG tablet  Commonly known as:  ANAPROX  Take 220 mg by mouth 2 (two) times daily as needed (for arthritis pain).     nitroGLYCERIN 0.4 MG SL tablet  Commonly known as:  NITROSTAT  Place 0.4 mg under the tongue every 5 (five) minutes as needed for chest pain.     omega-3 acid ethyl esters 1 G capsule  Commonly known as:  LOVAZA  Take 1 g by mouth daily.     oxyCODONE-acetaminophen 5-325 MG per tablet  Commonly known as:  PERCOCET/ROXICET  Take 1 tablet by mouth every 6 (six) hours as needed for severe pain.     potassium chloride 10 MEQ tablet  Commonly known as:  K-DUR  Take 1 tablet (10 mEq total) by mouth daily as needed (to take with Lasix).     traMADol 50 MG tablet  Commonly known as:  ULTRAM  Take 1 tablet (50 mg total) by mouth every 6 (six) hours as needed for moderate pain.     VENTOLIN HFA 108 (90 BASE) MCG/ACT inhaler  Generic drug:  albuterol  INhaLe 2 PufFS by mouth every 4 hours as needed for shortness of breath or wheezing       Allergies  Allergen Reactions  . Cymbalta [Duloxetine Hcl] Other (See Comments)    Depression  . Lyrica [Pregabalin] Other (See Comments)    Blurry vision   Follow-up Information    Follow up with [facility] On 09/03/2014.   Why:  You have an appoinment with the DOW clinc at 2:PM, call Monday and confirm, be at the office 35 minutes before  for check in.   Contact information:   [place] [phone]      Follow up with [facility].   Why:  HHPT, Nurse's aide   Contact information:   [address] [place] [address] [phone]       Follow up with  Melinda Crutch, MD.   Specialty:  Family Medicine   Why:  on Tuesday to check BP, check for fluid overload and follow up on pneumonia    Contact information:   [place] [address] [phone]       Follow up with Candee Furbish, MD.   Specialty:  Cardiology   Why:  there office should call you with an appt- call them on Tues if they do not call you   Contact information:   [address] [place] [address] [phone]        The results of significant diagnostics from this hospitalization (including imaging, microbiology, ancillary and laboratory) are listed below for reference.    Significant Diagnostic Studies: Dg Chest 2 View  08/16/2014   CLINICAL DATA:  Shortness of breath. History of left breast carcinoma  EXAM: CHEST  2 VIEW  COMPARISON:  August 10, 2014  FINDINGS: There remains persistent elevation left hemidiaphragm with atelectasis in the left base are region. There is new opacity in the lateral right base, felt to represent a small focus of pneumonia. Upper and mid lung zones are clear. Heart is mildly enlarged but stable. The pulmonary vascularity is normal. No adenopathy. Surgical clips in left breast region are again noted. There is degenerative change in the thoracic spine.  IMPRESSION: New infiltrate lateral right base. Persistent elevation left hemidiaphragm with left base atelectatic change. Stable cardiac  prominence.   Electronically Signed   By: Lowella Grip III M.D.   On: 08/16/2014 10:58   Dg Chest 2 View  08/10/2014   CLINICAL DATA:  Intermittent shortness of breath  EXAM: CHEST  2 VIEW  COMPARISON:  April 27, 2013  FINDINGS: There is persistent elevation the left hemidiaphragm. There is patchy left base atelectasis. There is no appreciable edema or consolidation. The heart is slightly enlarged but stable. The pulmonary vascularity is normal. No adenopathy. There are surgical clips in the left breast region. There is degenerative change in thoracic spine.  IMPRESSION: Stable elevation left hemidiaphragm with left base atelectasis. No edema or consolidation. Heart prominent but stable. Pulmonary vascularity within normal limits.   Electronically Signed   By: Lowella Grip III M.D.   On: 08/10/2014 20:24   Nm Hepatobiliary Liver Func  08/11/2014   CLINICAL DATA:  Predominantly lower abdominal pain and nausea for 3 weeks. No vomiting. Possible acute cholecystitis.  EXAM: NUCLEAR MEDICINE HEPATOBILIARY IMAGING  TECHNIQUE: Sequential images of the abdomen were obtained out to 60 minutes following intravenous administration of radiopharmaceutical.  RADIOPHARMACEUTICALS:  5.0 mCi Tc-59m  Choletec IV  COMPARISON:  Right upper quadrant ultrasound 08/10/2014  FINDINGS: There is prompt radiotracer uptake by the liver with excretion into the biliary system. Gallbladder activity is identified by 20 minutes with progressive accumulation. Radiotracer excretion into the small bowel is identified during the second hour of imaging.  IMPRESSION: Patent cystic duct without evidence of acute cholecystitis.   Electronically Signed   By: Logan Bores   On: 08/11/2014 11:49   Ct Abdomen Pelvis W Contrast  08/10/2014   CLINICAL DATA:  Subacute onset of mid and upper abdominal pain for 2 weeks. Throat burning. Nausea. Decreased appetite and oral intake. Initial encounter.  EXAM: CT ABDOMEN AND PELVIS WITH CONTRAST  TECHNIQUE: Multidetector CT imaging of the abdomen and pelvis was performed using the standard protocol following bolus administration of intravenous contrast.  CONTRAST:  111mL OMNIPAQUE IOHEXOL 300 MG/ML  SOLN  COMPARISON:   CT of the pelvis performed 02/19/2014, and pelvic ultrasound performed 10/14/2003  FINDINGS: Small bilateral pleural effusions are noted. The heart is mildly enlarged. Scattered coronary artery calcifications are seen.  Pericholecystic fluid is noted, and a stone is noted within the gallbladder. This raises question for mild acute cholecystitis. Would correlate for associated symptoms. A 1.2 cm cystic focus within the left hepatic lobe is of uncertain significance. Trace ascites is noted tracking about the inferior tip of the liver.  The visualized portions of the spleen are grossly unremarkable. The pancreas demonstrates minimal surrounding soft tissue stranding, though this remains nonspecific. There is mild stranding within the small bowel mesentery, with scattered associated borderline prominent nodes. The adrenal glands are unremarkable in appearance.  Mild calcification adjacent to the left adrenal gland may be vascular in nature.  Mild bilateral renal atrophy is noted. Nonspecific perinephric stranding is noted bilaterally. A few scattered small bilateral renal cysts are seen. There is no evidence of hydronephrosis. No renal or ureteral stones are identified.  The small bowel is unremarkable in appearance. The stomach is within normal limits. No acute vascular abnormalities are seen. Scattered calcification is noted along the abdominal aorta.  The appendix is normal in caliber, without evidence of appendicitis. Trace fluid is noted along the paracolic gutters bilaterally, more prominent on the right. Minimal diverticulosis is noted at the proximal sigmoid colon. The colon is otherwise unremarkable.  The bladder is decompressed and not  well seen. A small amount of free fluid is noted within the pelvis. The uterus is grossly unremarkable in appearance. The ovaries are relatively symmetric. No suspicious adnexal masses are seen. No inguinal lymphadenopathy is seen.  No acute osseous abnormalities are identified.  There is mild chronic loss of height at the superior endplate of vertebral body L4. The patient is status post decompression at L3 and L4.  IMPRESSION: 1. Pericholecystic fluid noted, with cholelithiasis. Though this is nonspecific given underlying trace ascites, it raises question for mild acute cholecystitis. Would correlate for associated symptoms. 2. Nonspecific minimal soft tissue stranding about the pancreas, without definite evidence for pancreatitis. Mild stranding is also noted within the adjacent small bowel mesentery, with associated borderline prominent nodes. This may reflect the patient's baseline. 3. Trace ascites noted about the liver and within the pelvis. 4. Small bilateral pleural effusions noted. 5. Mild cardiomegaly. Scattered coronary artery calcifications seen. 6. Nonspecific 1.2 cm cystic focus within the left hepatic lobe. 7. Mild bilateral renal atrophy noted. Scattered bilateral renal cysts seen. 8. Scattered calcification along the abdominal aorta. 9. Minimal diverticulosis of the proximal sigmoid colon, without evidence of diverticulitis. 10. Mild chronic loss of height at the superior endplate of vertebral body L4.   Electronically Signed   By: Garald Balding M.D.   On: 08/10/2014 22:30   US Abdomen Limited Ruq  08/11/2014   CLINICAL DATA:  81 year old female with right upper quadrant abdominal pain  EXAM: US ABDOMEN LIMITED - RIGHT UPPER QUADRANT  COMPARISON:  CT dated 08/10/2014  FINDINGS: Gallbladder:  There is a 1.2 cm stone in the gallbladder. There is thickening of the anterior gallbladder wall measuring up to 1 cm. No significant pericholecystic fluid. No tenderness was elicited over the gallbladder area during scanning.  Common bile duct:  Diameter: 4 mm  Liver:  No focal lesion identified. Within normal limits in parenchymal echogenicity.  A right-sided pleural effusion is partially visualized.  IMPRESSION: Cholelithiasis with thickened anterior gallbladder wall. Findings  may represent acute cholecystitis. A hepatobiliary scintigraphy may provide better evaluation of the gallbladder if an acute cholecystitis is clinically suspected.   Electronically Signed   By: Anner Crete M.D.   On: 08/11/2014 00:05    Microbiology: Recent Results (from the past 240 hour(s))  H.pylori Antigen, Stool     Status: None   Collection Time: 08/11/14  6:40 PM  Result Value Ref Range Status   Specimen Description STOOL  Final   Special Requests NONE  Final   H. pylori ag, stool NEGATIVE Performed at [facility]   Final   Report Status 08/13/2014 FINAL  Final  Surgical pcr screen     Status: None   Collection Time: 08/13/14  9:53 AM  Result Value Ref Range Status   MRSA, PCR NEGATIVE NEGATIVE Final   Staphylococcus aureus NEGATIVE NEGATIVE Final    Comment:        The Xpert SA Assay (FDA approved for NASAL specimens in patients over 24 years of age), is one component of a comprehensive surveillance program.  Test performance has been validated by [facility] for patients greater than or equal to 5 year old. It is not intended to diagnose infection nor to guide or monitor treatment.      Labs: Basic Metabolic Panel:  Recent Labs Lab 08/13/14 0553 08/14/14 0558 08/15/14 0500 08/16/14 0429 08/16/14 0855 08/17/14 0450  NA 139 141 139 140  --  143  K 4.5 4.9 4.5 4.4  --  4.0  CL 109 111 109 109  --  109  CO2 23 23 22 25   --  26  GLUCOSE 110* 122* 111* 101*  --  115*  BUN 33* 32* 39* 38*  --  34*  CREATININE 1.27* 1.16* 1.45* 1.32*  --  1.33*  CALCIUM 8.3* 8.5* 8.6* 8.8*  --  8.9  MG  --   --   --   --  1.8  --    Liver Function Tests:  Recent Labs Lab 08/13/14 0553 08/14/14 0558 08/15/14 0500 08/16/14 0429 08/17/14 0450  AST 26 42* 38 27 25  ALT 24 32 30 25 22   ALKPHOS 40 41 42 41 43  BILITOT 0.7 0.6 0.6 0.5 0.6  PROT 6.7 6.6 6.8 6.6 6.4*  ALBUMIN 3.4* 3.4* 3.7 3.5 3.4*    Recent Labs Lab 08/10/14 2024  LIPASE 14*   No  results for input(s): AMMONIA in the last 168 hours. CBC:  Recent Labs Lab 08/10/14 2024  08/13/14 0553 08/14/14 0558 08/15/14 0500 08/16/14 0429 08/17/14 0450  WBC 6.7  < > 5.8 10.4 9.0 8.5 7.9  NEUTROABS 4.2  --   --   --   --   --   --   HGB 11.2*  < > 10.4* 10.0* 10.2* 10.5* 10.3*  HCT 34.9*  < > 33.2* 32.4* 33.1* 33.8* 32.8*  MCV 91.8  < > 94.9 93.4 94.3 94.4 93.2  PLT 193  < > 162 139* 158 152 160  < > = values in this interval not displayed. Cardiac Enzymes:  Recent Labs Lab 08/13/14 1920 08/14/14 0003 08/14/14 0558  TROPONINI <0.03 0.03 0.03   BNP: BNP (last 3 results)  Recent Labs  08/16/14 0855  BNP 1169.9*    ProBNP (last 3 results) No results for input(s): PROBNP in the last 8760 hours.  CBG: No results for input(s): GLUCAP in the last 168 hours.     SignedDebbe Odea, MD [facility] 08/17/2014, 2:27 PM

## 2014-08-15 NOTE — Progress Notes (Signed)
Echocardiogram 2D Echocardiogram has been performed.  08/15/2014 4:36 PM Maudry Mayhew, RVT, RDCS, RDMS

## 2014-08-15 NOTE — Care Management Note (Signed)
Case Management Note  Patient Details  Name: Alexandra [name] MRN: [id number] Date of Birth: 07-Mar-1927  Subjective/Objective:  Patient chose [facility]. [facility] rep Kristen, aware of [place] orders, await d/c order.                  Action/Plan:d/c plan home w/[facility].   Expected Discharge Date:                 Expected Discharge Plan:  [place]  In-House Referral:     Discharge planning Services  CM Consult  Post Acute Care Choice:    Choice offered to:  Patient  DME Arranged:    DME Agency:     HH Arranged:  PT, Nurse's Aide Jessie Agency:  [facility]  Status of Service:  In process, will continue to follow  Medicare Important Message Given:  Yes-second notification given Date Medicare IM Given:    Medicare IM give by:    Date Additional Medicare IM Given:    Additional Medicare Important Message give by:     If discussed at [place] of Stay Meetings, dates discussed:    Additional Comments:  Dessa Phi, RN 08/15/2014, 1:03 PM

## 2014-08-15 NOTE — Progress Notes (Signed)
2 Days Post-Op  Subjective: She has various complaints, still sore, but eating well without nausea.  No pain right shoulder.  Hard to walk, she is weak, and needs walker to assist.  Family awaiting cardiology work up. One strip showing ST on telem  Objective: Vital signs in last 24 hours: Temp:  [97.5 F (36.4 C)-98.2 F (36.8 C)] 97.8 F (36.6 C) (07/28 0456) Pulse Rate:  [90-118] 90 (07/28 0456) Resp:  [18-20] 20 (07/28 0456) BP: (113-122)/(61-69) 122/69 mmHg (07/28 0456) SpO2:  [96 %-100 %] 98 % (07/28 0456) Last BM Date: 08/14/14 510 PO  Heart healthy diet Afebrile, VSS, some tachycardia Creatinine is up some, LFT's are normal, WBC is normal No films Intake/Output from previous day: 07/27 0701 - 07/28 0700 In: 1168.3 [P.O.:510; I.V.:588.3] Out: -  Intake/Output this shift:    General appearance: alert, cooperative and no distress GI: soft sore, sites all look fine.  Lab Results:   Recent Labs  08/14/14 0558 08/15/14 0500  WBC 10.4 9.0  HGB 10.0* 10.2*  HCT 32.4* 33.1*  PLT 139* 158    BMET  Recent Labs  08/14/14 0558 08/15/14 0500  NA 141 139  K 4.9 4.5  CL 111 109  CO2 23 22  GLUCOSE 122* 111*  BUN 32* 39*  CREATININE 1.16* 1.45*  CALCIUM 8.5* 8.6*   PT/INR No results for input(s): LABPROT, INR in the last 72 hours.   Recent Labs Lab 08/11/14 0810 08/12/14 0502 08/13/14 0553 08/14/14 0558 08/15/14 0500  AST 29 30 26  42* 38  ALT 25 24 24  32 30  ALKPHOS 41 40 40 41 42  BILITOT 0.9 0.8 0.7 0.6 0.6  PROT 6.8 6.8 6.7 6.6 6.8  ALBUMIN 3.5 3.6 3.4* 3.4* 3.7     Lipase     Component Value Date/Time   LIPASE 14* 08/10/2014 2024     Studies/Results: No results found.  Medications: . chlorhexidine  15 mL Mouth/Throat BID  . levothyroxine  25 mcg Oral QAC breakfast  . pantoprazole  40 mg Oral BID  . sucralfate  1 g Oral TID WC & HS    Assessment/Plan Periumbilical pain with cholelithiasis Chronic kidney disease, stage III Anemia   Antibiotics: 2 days of rocephin, 1 day of Flagyl; none since 08/11/14.  Preop 08/13/14 DVT: currently SCD's only    Plan:  Agree with ongoing medical care.  From our standpoint she is doing well. She can go back on Heparin for DVT prophylaxis.  I would increase ambulation and when she is stable medically she should be able to go home. We will set her up with follow up in the clinic before she goes home.     LOS: 4 days    Keylah Darwish 08/15/2014

## 2014-08-15 NOTE — Progress Notes (Addendum)
[facility] Progress Note   [name] [id number] DOB: 10/03/1927 DOA: 08/10/2014 PCP:  Melinda Crutch, MD  Brief narrative: Alexandra [name] is a 81 y.o. female with history of breast cancer, peripheral neuropathy, hypothyroidism presented to the hospital with abdominal pain. She was suspected to have acute cholecystitis and therefore placed on ciprofloxacin and Flagyl. Hydroscan was negative but patient continued to have right upper quadrant pain and therefore surgery decided to take her for a laparoscopic cholecystectomy which was done on 7/26.   Subjective: Having some right upper quadrant discomfort today.  Assessment/Plan: Principal Problem:   Acute cholecystitis -See above - Laparoscopic cholecystectomy 7/26 -Has been placed on a solid diet by surgery  Active Problems:  SVT - short duration after surgery-given a dose of Cardizem 5 mg IV -Currently has mild sinus tachycardia-no chest pain-no shortness of breath - ECHO reveals and EF of 20%- possible Takatsubos- have notified cardiology for a consult- will start low dose Coreg-- follow BP on Coreg- will consider ACE but will not start tonight to prevent hypotension -  not fluid overloaded and does not need Lasix tonight  CKD (chronic kidney disease) stage 3, GFR 30-59 ml/min - Cr has been stable    Anemia of chronic disease - Hb stable at 10    Hypothyroidism -Continue Synthroid   Code Status: full code Family Communication: daughter at bedside Disposition Plan: home in AM DVT prophylaxis: SCds Consultants:surgery Procedures: 7/26- lap cholecystectomy   Antibiotics: Anti-infectives    Start     Dose/Rate Route Frequency Ordered Stop   08/13/14 1145  [MAR Hold]  cefTRIAXone (ROCEPHIN) 2 g in dextrose 5 % 50 mL IVPB - Premix     (MAR Hold since 08/13/14 1536)   2 g 100 mL/hr over 30 Minutes Intravenous 30 min pre-op 08/13/14 1143 08/13/14 1553   08/12/14 0015  cefTRIAXone (ROCEPHIN) 1 g in dextrose 5 %  50 mL IVPB  Status:  Discontinued     1 g 100 mL/hr over 30 Minutes Intravenous Every 24 hours 08/11/14 0037 08/11/14 0255   08/11/14 1000  cefTRIAXone (ROCEPHIN) 2 g in dextrose 5 % 50 mL IVPB - Premix  Status:  Discontinued     2 g 100 mL/hr over 30 Minutes Intravenous Every 24 hours 08/11/14 0255 08/11/14 1630   08/11/14 0045  metroNIDAZOLE (FLAGYL) IVPB 500 mg  Status:  Discontinued     500 mg 100 mL/hr over 60 Minutes Intravenous Every 8 hours 08/11/14 0037 08/11/14 1630   08/11/14 0015  cefTRIAXone (ROCEPHIN) 1 g in dextrose 5 % 50 mL IVPB     1 g 100 mL/hr over 30 Minutes Intravenous  Once 08/11/14 0014 08/11/14 0215      Objective: Filed Weights   08/11/14 0247  Weight: 90.4 kg (199 lb 4.7 oz)    Intake/Output Summary (Last 24 hours) at 08/15/14 1834 Last data filed at 08/15/14 1402  Gross per 24 hour  Intake    880 ml  Output      0 ml  Net    880 ml     Vitals Filed Vitals:   08/14/14 1357 08/14/14 2148 08/15/14 0456 08/15/14 1522  BP: 117/61 113/68 122/69 122/74  Pulse: 100 118 90 109  Temp: 98.2 F (36.8 C) 97.5 F (36.4 C) 97.8 F (36.6 C) 97.4 F (36.3 C)  TempSrc: Oral Oral Oral Oral  Resp: 18 18 20 20   Height:      Weight:      SpO2:  96% 100% 98% 94%    Exam:  General:  Pt is alert, not in acute distress  HEENT: No icterus, No thrush, oral mucosa moist  Cardiovascular: regular rate and rhythm, S1/S2 No murmur  Respiratory: clear to auscultation bilaterally   Abdomen: Soft, +Bowel sounds, mild diffuse tender, non distended, no guarding  MSK: No LE edema, cyanosis or clubbing  Data Reviewed: Basic Metabolic Panel:  Recent Labs Lab 08/11/14 0810 08/12/14 0502 08/13/14 0553 08/14/14 0558 08/15/14 0500  NA 137 140 139 141 139  K 4.6 4.4 4.5 4.9 4.5  CL 105 111 109 111 109  CO2 25 20* 23 23 22   GLUCOSE 95 73 110* 122* 111*  BUN 29* 27* 33* 32* 39*  CREATININE 1.25* 1.09* 1.27* 1.16* 1.45*  CALCIUM 8.5* 8.4* 8.3* 8.5* 8.6*    Liver Function Tests:  Recent Labs Lab 08/11/14 0810 08/12/14 0502 08/13/14 0553 08/14/14 0558 08/15/14 0500  AST 29 30 26  42* 38  ALT 25 24 24  32 30  ALKPHOS 41 40 40 41 42  BILITOT 0.9 0.8 0.7 0.6 0.6  PROT 6.8 6.8 6.7 6.6 6.8  ALBUMIN 3.5 3.6 3.4* 3.4* 3.7    Recent Labs Lab 08/10/14 2024  LIPASE 14*   No results for input(s): AMMONIA in the last 168 hours. CBC:  Recent Labs Lab 08/10/14 2024 08/11/14 0810 08/12/14 0502 08/13/14 0553 08/14/14 0558 08/15/14 0500  WBC 6.7 6.1 5.9 5.8 10.4 9.0  NEUTROABS 4.2  --   --   --   --   --   HGB 11.2* 10.2* 10.7* 10.4* 10.0* 10.2*  HCT 34.9* 32.5* 33.9* 33.2* 32.4* 33.1*  MCV 91.8 93.4 92.9 94.9 93.4 94.3  PLT 193 174 148* 162 139* 158   Cardiac Enzymes:  Recent Labs Lab 08/13/14 1920 08/14/14 0003 08/14/14 0558  TROPONINI <0.03 0.03 0.03   BNP (last 3 results) No results for input(s): BNP in the last 8760 hours.  ProBNP (last 3 results) No results for input(s): PROBNP in the last 8760 hours.  CBG: No results for input(s): GLUCAP in the last 168 hours.  Recent Results (from the past 240 hour(s))  H.pylori Antigen, Stool     Status: None   Collection Time: 08/11/14  6:40 PM  Result Value Ref Range Status   Specimen Description STOOL  Final   Special Requests NONE  Final   H. pylori ag, stool NEGATIVE Performed at [facility]   Final   Report Status 08/13/2014 FINAL  Final  Surgical pcr screen     Status: None   Collection Time: 08/13/14  9:53 AM  Result Value Ref Range Status   MRSA, PCR NEGATIVE NEGATIVE Final   Staphylococcus aureus NEGATIVE NEGATIVE Final    Comment:        The Xpert SA Assay (FDA approved for NASAL specimens in patients over 29 years of age), is one component of a comprehensive surveillance program.  Test performance has been validated by [facility] for patients greater than or equal to 50 year old. It is not intended to diagnose infection nor to guide or  monitor treatment.      Studies: No results found.  Scheduled Meds:  Scheduled Meds: . chlorhexidine  15 mL Mouth/Throat BID  . levothyroxine  25 mcg Oral QAC breakfast  . pantoprazole  40 mg Oral BID  . sucralfate  1 g Oral TID WC & HS   Continuous Infusions:    Time spent on care of this patient:  80 min   [place], MD 08/15/2014, 6:34 PM  LOS: 4 days   [facility] Office  [phone] Pager - Text Page per [email or web address] If 7PM-7AM, please contact night-coverage [email or web address]

## 2014-08-16 ENCOUNTER — Inpatient Hospital Stay (HOSPITAL_COMMUNITY): Payer: Commercial Managed Care - HMO

## 2014-08-16 DIAGNOSIS — I447 Left bundle-branch block, unspecified: Secondary | ICD-10-CM | POA: Diagnosis present

## 2014-08-16 DIAGNOSIS — I5021 Acute systolic (congestive) heart failure: Secondary | ICD-10-CM | POA: Diagnosis not present

## 2014-08-16 DIAGNOSIS — I429 Cardiomyopathy, unspecified: Secondary | ICD-10-CM

## 2014-08-16 DIAGNOSIS — J45909 Unspecified asthma, uncomplicated: Secondary | ICD-10-CM | POA: Diagnosis present

## 2014-08-16 DIAGNOSIS — I471 Supraventricular tachycardia: Secondary | ICD-10-CM | POA: Diagnosis not present

## 2014-08-16 LAB — CBC
HCT: 33.8 % — ABNORMAL LOW (ref 36.0–46.0)
HEMOGLOBIN: 10.5 g/dL — AB (ref 12.0–15.0)
MCH: 29.3 pg (ref 26.0–34.0)
MCHC: 31.1 g/dL (ref 30.0–36.0)
MCV: 94.4 fL (ref 78.0–100.0)
PLATELETS: 152 10*3/uL (ref 150–400)
RBC: 3.58 MIL/uL — AB (ref 3.87–5.11)
RDW: 14.9 % (ref 11.5–15.5)
WBC: 8.5 10*3/uL (ref 4.0–10.5)

## 2014-08-16 LAB — COMPREHENSIVE METABOLIC PANEL
ALT: 25 U/L (ref 14–54)
ANION GAP: 6 (ref 5–15)
AST: 27 U/L (ref 15–41)
Albumin: 3.5 g/dL (ref 3.5–5.0)
Alkaline Phosphatase: 41 U/L (ref 38–126)
BILIRUBIN TOTAL: 0.5 mg/dL (ref 0.3–1.2)
BUN: 38 mg/dL — ABNORMAL HIGH (ref 6–20)
CHLORIDE: 109 mmol/L (ref 101–111)
CO2: 25 mmol/L (ref 22–32)
Calcium: 8.8 mg/dL — ABNORMAL LOW (ref 8.9–10.3)
Creatinine, Ser: 1.32 mg/dL — ABNORMAL HIGH (ref 0.44–1.00)
GFR calc Af Amer: 41 mL/min — ABNORMAL LOW (ref >60)
GFR calc non Af Amer: 36 mL/min — ABNORMAL LOW (ref >60)
Glucose, Bld: 101 mg/dL — ABNORMAL HIGH (ref 65–99)
Potassium: 4.4 mmol/L (ref 3.5–5.1)
Sodium: 140 mmol/L (ref 135–145)
Total Protein: 6.6 g/dL (ref 6.5–8.1)

## 2014-08-16 LAB — MAGNESIUM: Magnesium: 1.8 mg/dL (ref 1.7–2.4)

## 2014-08-16 LAB — BRAIN NATRIURETIC PEPTIDE: B Natriuretic Peptide: 1169.9 pg/mL — ABNORMAL HIGH (ref 0.0–100.0)

## 2014-08-16 LAB — TSH: TSH: 1.424 u[IU]/mL (ref 0.350–4.500)

## 2014-08-16 MED ORDER — FUROSEMIDE 10 MG/ML IJ SOLN
10.0000 mg | Freq: Once | INTRAMUSCULAR | Status: DC
  Filled 2014-08-16: qty 2

## 2014-08-16 MED ORDER — FUROSEMIDE 10 MG/ML IJ SOLN: 20.0000 mg | Freq: Every day | INTRAMUSCULAR | Status: DC

## 2014-08-16 MED ORDER — PANTOPRAZOLE SODIUM 40 MG PO TBEC
40.0000 mg | DELAYED_RELEASE_TABLET | Freq: Every day | ORAL | Status: DC
  Administered 2014-08-17: 40 mg via ORAL
  Filled 2014-08-16: qty 1

## 2014-08-16 MED ORDER — IBUPROFEN 200 MG PO TABS: 400.0000 mg | ORAL_TABLET | Freq: Four times a day (QID) | ORAL | Status: DC | PRN

## 2014-08-16 MED ORDER — FUROSEMIDE 10 MG/ML IJ SOLN
10.0000 mg | Freq: Once | INTRAMUSCULAR | Status: AC
  Administered 2014-08-16: 10 mg via INTRAVENOUS

## 2014-08-16 NOTE — Progress Notes (Signed)
Report received from R. Mike Gip, RN. No change from initial pm assessment. Will continue to monitor and follow the POC.

## 2014-08-16 NOTE — Consult Note (Signed)
Reason for Consult:   Cardiomyopathy  Requesting Physician: [facility] Primary Cardiologist Dr Marlou Porch  HPI: This is a 81 y.o. female with a past medical history significant for asthma and LBBB. She was evaluated in 2013 for chest pain with a nuclear stress test which was low risk and an echo which showed her EF to be 50-55%. She was seen again in 2014 for chest pain felt to be non cardiac. She was diagnosed with HTN at that time. She has no history of MI, CHF, arrhythmia, or dyslipidemia. Up until recently she had been doing well. She had a check up by her PCP (Dr Cristi Loron) in March. In 07-Jun-2022 her husband passed away and she admits to being under a great deal of stress. She denies any edema but admits to some recent DOE.  She was admitted 08/10/14 with abdominal pain and found to have acute cholecystitis. She underwent laparoscopic cholecystectomy 08/13/14. Post op she was noted to have runs of WCT-( I saw PSVT- 7 bts on 7/28 as well as short run of PAF). An echo was obtained 08/15/14 which revealed severe cardiomyopathy with an EF of 20%. During this admission she was noted to have renal insufficiency. She has been liberally hydrated (I/O + > 8L). She did well initially post op but this am she feels SOB.   PMHx:  Past Medical History  Diagnosis Date  . Arthritis   . Headache(784.0)   . Anxiety   . Depression   . Asthma   . CAP (community acquired pneumonia) 12/19/2012  . Intermittent LBBB (left bundle branch block) 12/21/2012  . Obesity   . Peripheral neuropathy   . Sacroiliitis, not elsewhere classified   . Osteopenia   . Hypercholesteremia   . Neuropathy   . Trigger finger     [name]  . Chest pain     NM stress test 05/2011 Normal  . DOE (dyspnea on exertion)     No SOB at rest  . Fatigue     excertional  . breast ca dx'd 2000    left  . Essential (primary) hypertension 04/02/2014  . Adult hypothyroidism 04/02/2014  . Hereditary and idiopathic peripheral  neuropathy 07/02/2014  . Leg weakness   . CKD (chronic kidney disease) stage 3, GFR 30-59 ml/min 08/13/2014  . Anemia of chronic disease 08/13/2014    Past Surgical History  Procedure Laterality Date  . Leg surgery    . Breast lumpectomy      breast cancer  . Lumbar laminectomy    . Achilles tendon surgery    . Tonsillectomy    . Vesicovaginal fistula closure w/ tah      DC hysterectomy  . Polypectomy    . Cholecystectomy N/A 08/13/2014    Procedure: LAPAROSCOPIC CHOLECYSTECTOMY;  Surgeon: Stark Klein, MD;  Location: [facility];  Service: General;  Laterality: N/A;    SOCHx:  reports that she has never smoked. She has never used smokeless tobacco. She reports that she does not drink alcohol or use illicit drugs.  FAMHx: Family History  Problem Relation Age of Onset  . Pneumonia Mother 48    cause of death  . Diabetes Brother   . Alzheimer's disease Sister   . CVA Daughter     01/21/09  . Pancreatic cancer Father   . Pneumonia Mother     ALLERGIES: Allergies  Allergen Reactions  . Cymbalta [Duloxetine Hcl] Other (See Comments)    Depression  .  Lyrica [Pregabalin] Other (See Comments)    Blurry vision    ROS: Pertinent items are noted in HPI. see H&P for complete ROS. No chest pain, no history of stroke or GI bleed.  HOME MEDICATIONS: Prior to Admission medications   Medication Sig Start Date End Date Taking? Authorizing Provider  alendronate (FOSAMAX) 70 MG tablet Take 70 mg by mouth every Saturday. Take with a full glass of water on an empty stomach.   Yes Historical Provider, MD  ALPRAZolam (XANAX) 0.25 MG tablet TK 1 T PO TID PRN anxiety 07/19/14  Yes Historical Provider, MD  aspirin EC 81 MG tablet Take 81 mg by mouth daily.   Yes Historical Provider, MD  b complex vitamins tablet Take 1 tablet by mouth daily.   Yes Historical Provider, MD  Calcium-Vitamin D (CALTRATE 600 PLUS-VIT D PO) Take 1 tablet by mouth 2 (two) times daily.   Yes Historical Provider, MD    cholecalciferol (VITAMIN D) 1000 UNITS tablet Take 1,000 Units by mouth daily.   Yes Historical Provider, MD  levothyroxine (SYNTHROID, LEVOTHROID) 25 MCG tablet Take 25 mcg by mouth daily before breakfast.   Yes Historical Provider, MD  Multiple Vitamin (MULITIVITAMIN WITH MINERALS) TABS Take 1 tablet by mouth daily.   Yes Historical Provider, MD  naproxen sodium (ANAPROX) 220 MG tablet Take 220 mg by mouth 2 (two) times daily as needed (for arthritis pain).    Yes Historical Provider, MD  nitroGLYCERIN (NITROSTAT) 0.4 MG SL tablet Place 0.4 mg under the tongue every 5 (five) minutes as needed for chest pain.   Yes Historical Provider, MD  omega-3 acid ethyl esters (LOVAZA) 1 G capsule Take 1 g by mouth daily.   Yes Historical Provider, MD  traMADol (ULTRAM) 50 MG tablet Take 1 tablet (50 mg total) by mouth every 6 (six) hours as needed for moderate pain. 04/01/14  Yes Dickie La, MD  VENTOLIN HFA 108 (90 BASE) MCG/ACT inhaler INhaLe 2 PufFS by mouth every 4 hours as needed for shortness of breath or wheezing 07/19/14  Yes Historical Provider, MD  oxyCODONE-acetaminophen (PERCOCET/ROXICET) 5-325 MG per tablet Take 1 tablet by mouth every 6 (six) hours as needed for severe pain. 08/15/14   Debbe Odea, MD    HOSPITAL MEDICATIONS: I have reviewed the patient's current medications.  VITALS: Blood pressure 131/69, pulse 104, temperature 97.5 F (36.4 C), temperature source Oral, resp. rate 20, height 5\' 4"  (1.626 m), weight 199 lb 4.7 oz (90.4 kg), SpO2 96 %.  PHYSICAL EXAM: General appearance: alert, cooperative, no distress and moderately obese Neck: no carotid bruit and JVD noted Lungs: decreased breath sounds bilateraly and rales bilateraly Heart: regular rate and rhythm Abdomen: obese, not distended, echymosis noted Extremities: 1-2+ pre tibial edema Pulses: 2+ and symmetric Skin: pale, cool, dry Neurologic: Grossly normal  LABS: Results for orders placed or performed during the  hospital encounter of 08/10/14 (from the past 24 hour(s))  CBC     Status: Abnormal   Collection Time: 08/16/14  4:29 AM  Result Value Ref Range   WBC 8.5 4.0 - 10.5 K/uL   RBC 3.58 (L) 3.87 - 5.11 MIL/uL   Hemoglobin 10.5 (L) 12.0 - 15.0 g/dL   HCT 33.8 (L) 36.0 - 46.0 %   MCV 94.4 78.0 - 100.0 fL   MCH 29.3 26.0 - 34.0 pg   MCHC 31.1 30.0 - 36.0 g/dL   RDW 14.9 11.5 - 15.5 %   Platelets 152 150 - 400 K/uL  Comprehensive metabolic panel     Status: Abnormal   Collection Time: 08/16/14  4:29 AM  Result Value Ref Range   Sodium 140 135 - 145 mmol/L   Potassium 4.4 3.5 - 5.1 mmol/L   Chloride 109 101 - 111 mmol/L   CO2 25 22 - 32 mmol/L   Glucose, Bld 101 (H) 65 - 99 mg/dL   BUN 38 (H) 6 - 20 mg/dL   Creatinine, Ser 1.32 (H) 0.44 - 1.00 mg/dL   Calcium 8.8 (L) 8.9 - 10.3 mg/dL   Total Protein 6.6 6.5 - 8.1 g/dL   Albumin 3.5 3.5 - 5.0 g/dL   AST 27 15 - 41 U/L   ALT 25 14 - 54 U/L   Alkaline Phosphatase 41 38 - 126 U/L   Total Bilirubin 0.5 0.3 - 1.2 mg/dL   GFR calc non Af Amer 36 (L) >60 mL/min   GFR calc Af Amer 41 (L) >60 mL/min   Anion gap 6 5 - 15    EKG: NSR, LBBB  Echo 08/15/14 Impressions:  - This was a very technically difficult study. The LV was moderately dilated with EF 20%. There was diffuse severe hypokinesis with some preservation of function at the LV base (raising question of Takotsubo cardiomyopathy). The RV was poorly visualized but appeared moderately dilated with moderately decreased systolic function. Biatrial enlargement. Dilated IVC suggestive of elevated RV filling pressure. Mild MR and TR. Moderate pulmonary hypertension.  IMAGING: CXR 08/10/14 IMPRESSION: Stable elevation left hemidiaphragm with left base atelectasis. No edema or consolidation. Heart prominent but stable. Pulmonary vascularity within normal limits.  IMPRESSION: Principal Problem:   Acute cholecystitis Active Problems:   Cardiomyopathy- etiology not yet  determined   Acute systolic heart failure   CKD (chronic kidney disease) stage 3, GFR 30-59 ml/min   PSVT (paroxysmal supraventricular tachycardia)   Essential (primary) hypertension   Anemia of chronic disease   Hypothyroidism   H/O Asthma   LBBB (left bundle branch block)   RECOMMENDATION Troponin checked post op was normal, doubt peri-op MI. Its possible she may have had a Taktosubo event after the recent death of her husband. Despite renal insufficiency I think she is in CHF now and will give IV Lasix 40 mg x 1 and check BNP, CXR, and document TSH. Low dose Coreg has been added, will watch for worsening asthma symptoms, if so would try low dose metoprolol. Hesitant to add ARB just yet, will see what her renal function does with diuresis. At some point she will need repeat Myoview. MD to see.    Time Spent Directly with Patient: 45 minutes  Erlene Quan [phone] beeper 08/16/2014, 8:32 AM   Patient seen, examined. Available data reviewed. Agree with findings, assessment, and plan as outlined by Kerin Ransom, PA-C. The patient is independently interviewed and examined. She is an elderly woman in no distress. She is somnolent but arousable. She follows commands appropriately. Lung fields are clear. Heart is regular rate and rhythm with a 2/6 systolic ejection murmur at the left lower sternal border. There is 1+ pretibial edema.  I have reviewed the patient's echocardiogram and suspect that she has a stress cardiomyopathy such as Takotsubo's concomitant with her acute medical illness and/or related to her husband's death this Spring. It is possible that she has obstructive CAD but she doesn't have any history of angina and her cardiac markers are negative. Favor treatment with carvedilol as initiated. Would diurese with daily lasix considering her elevated BNP and peripheral  edema, both suggestive of acute systolic heart failure. Add ACE-I at low dose tomorrow if BP tolerates. Should have  a follow-up echo in 6-8 weeks to assess for recovery of LV function and if remains depressed consider ischemic workup at that time.  Sherren Mocha, M.D. 08/16/2014 1:15 PM

## 2014-08-16 NOTE — Progress Notes (Addendum)
[facility] Progress Note   Alexandra [name] [id number] DOB: 16-Nov-1927 DOA: 08/10/2014 PCP:  Melinda Crutch, MD  Brief narrative: Alexandra [name] is a 81 y.o. female with history of breast cancer, peripheral neuropathy, hypothyroidism presented to the hospital with abdominal pain. She was suspected to have acute cholecystitis and therefore placed on ciprofloxacin and Flagyl. Hydroscan was negative but patient continued to have right upper quadrant pain and therefore surgery decided to take her for a laparoscopic cholecystectomy which was done on 7/26.   Subjective: Depressed. No shortness of breath, abdominal pain, vomiting. Able to eat solid food. Noted to be coughing up yellow sputum on my eval. She states it happens at home sometimes. It has been occurring in hospital in the mornings after she wakes up.   Assessment/Plan: Principal Problem:   Acute cholecystitis -See above - Laparoscopic cholecystectomy 7/26 -Has been placed on a solid diet by surgery and is tolerating it  Active Problems:  SVT/ acute systolic CHF - suspected takatsubo's cardiomyopathy in relation to recent death of husband - had a short run of SVT on 7/27 -continues to have mild sinus tachycardia-no chest pain-no shortness of breath - ECHO reveals and EF of 20%- possible Takatsubos- have notified cardiology for a consult- started low dose Coreg-- follow BP on Coreg- will consider ACE  -   Lasix 10 mg IV ordered this AM  by cardiology- pulse ox 100 % on room air and 91% on exertion - mild crackles in RLL  - have ordered daily weights and I and Os - follow Cr   Yellow sputum - no fevers or leukocytosis- only occurring in the AM - will follow clinically   CKD (chronic kidney disease) stage 3, GFR 30-59 ml/min - follow Cr with diuresis    Anemia of chronic disease - Hb stable at 10    Hypothyroidism -Continue Synthroid   Code Status: full code Family Communication: daughter at  bedside Disposition Plan: home when cardiac assessment complete DVT prophylaxis: SCds Consultants:surgery Procedures: 7/26- lap cholecystectomy   Antibiotics: Anti-infectives    Start     Dose/Rate Route Frequency Ordered Stop   08/13/14 1145  [MAR Hold]  cefTRIAXone (ROCEPHIN) 2 g in dextrose 5 % 50 mL IVPB - Premix     (MAR Hold since 08/13/14 1536)   2 g 100 mL/hr over 30 Minutes Intravenous 30 min pre-op 08/13/14 1143 08/13/14 1553   08/12/14 0015  cefTRIAXone (ROCEPHIN) 1 g in dextrose 5 % 50 mL IVPB  Status:  Discontinued     1 g 100 mL/hr over 30 Minutes Intravenous Every 24 hours 08/11/14 0037 08/11/14 0255   08/11/14 1000  cefTRIAXone (ROCEPHIN) 2 g in dextrose 5 % 50 mL IVPB - Premix  Status:  Discontinued     2 g 100 mL/hr over 30 Minutes Intravenous Every 24 hours 08/11/14 0255 08/11/14 1630   08/11/14 0045  metroNIDAZOLE (FLAGYL) IVPB 500 mg  Status:  Discontinued     500 mg 100 mL/hr over 60 Minutes Intravenous Every 8 hours 08/11/14 0037 08/11/14 1630   08/11/14 0015  cefTRIAXone (ROCEPHIN) 1 g in dextrose 5 % 50 mL IVPB     1 g 100 mL/hr over 30 Minutes Intravenous  Once 08/11/14 0014 08/11/14 0215      Objective: Filed Weights   08/11/14 0247  Weight: 90.4 kg (199 lb 4.7 oz)    Intake/Output Summary (Last 24 hours) at 08/16/14 0954 Last data filed at 08/16/14 0925  Gross per 24  hour  Intake    360 ml  Output    150 ml  Net    210 ml     Vitals Filed Vitals:   08/15/14 1522 08/15/14 2146 08/16/14 0349 08/16/14 0905  BP: 122/74 124/74 131/69   Pulse: 109 95 104   Temp: 97.4 F (36.3 C) 97.9 F (36.6 C) 97.5 F (36.4 C)   TempSrc: Oral Oral Oral   Resp: 20 20 20    Height:      Weight:      SpO2: 94% 100% 96% 100%    Exam:  General:  Pt is alert, not in acute distress  HEENT: No icterus, No thrush, oral mucosa moist  Cardiovascular: regular rate and rhythm, S1/S2 No murmur  Respiratory: crackles in RLL  Abdomen: Soft, +Bowel sounds, mild  diffuse tender, non distended, no guarding  MSK: No LE edema, cyanosis or clubbing  Data Reviewed: Basic Metabolic Panel:  Recent Labs Lab 08/12/14 0502 08/13/14 0553 08/14/14 0558 08/15/14 0500 08/16/14 0429 08/16/14 0855  NA 140 139 141 139 140  --   K 4.4 4.5 4.9 4.5 4.4  --   CL 111 109 111 109 109  --   CO2 20* 23 23 22 25   --   GLUCOSE 73 110* 122* 111* 101*  --   BUN 27* 33* 32* 39* 38*  --   CREATININE 1.09* 1.27* 1.16* 1.45* 1.32*  --   CALCIUM 8.4* 8.3* 8.5* 8.6* 8.8*  --   MG  --   --   --   --   --  1.8   Liver Function Tests:  Recent Labs Lab 08/12/14 0502 08/13/14 0553 08/14/14 0558 08/15/14 0500 08/16/14 0429  AST 30 26 42* 38 27  ALT 24 24 32 30 25  ALKPHOS 40 40 41 42 41  BILITOT 0.8 0.7 0.6 0.6 0.5  PROT 6.8 6.7 6.6 6.8 6.6  ALBUMIN 3.6 3.4* 3.4* 3.7 3.5    Recent Labs Lab 08/10/14 2024  LIPASE 14*   No results for input(s): AMMONIA in the last 168 hours. CBC:  Recent Labs Lab 08/10/14 2024  08/12/14 0502 08/13/14 0553 08/14/14 0558 08/15/14 0500 08/16/14 0429  WBC 6.7  < > 5.9 5.8 10.4 9.0 8.5  NEUTROABS 4.2  --   --   --   --   --   --   HGB 11.2*  < > 10.7* 10.4* 10.0* 10.2* 10.5*  HCT 34.9*  < > 33.9* 33.2* 32.4* 33.1* 33.8*  MCV 91.8  < > 92.9 94.9 93.4 94.3 94.4  PLT 193  < > 148* 162 139* 158 152  < > = values in this interval not displayed. Cardiac Enzymes:  Recent Labs Lab 08/13/14 1920 08/14/14 0003 08/14/14 0558  TROPONINI <0.03 0.03 0.03   BNP (last 3 results)  Recent Labs  08/16/14 0855  BNP 1169.9*    ProBNP (last 3 results) No results for input(s): PROBNP in the last 8760 hours.  CBG: No results for input(s): GLUCAP in the last 168 hours.  Recent Results (from the past 240 hour(s))  H.pylori Antigen, Stool     Status: None   Collection Time: 08/11/14  6:40 PM  Result Value Ref Range Status   Specimen Description STOOL  Final   Special Requests NONE  Final   H. pylori ag, stool  NEGATIVE Performed at [facility]   Final   Report Status 08/13/2014 FINAL  Final  Surgical pcr screen  Status: None   Collection Time: 08/13/14  9:53 AM  Result Value Ref Range Status   MRSA, PCR NEGATIVE NEGATIVE Final   Staphylococcus aureus NEGATIVE NEGATIVE Final    Comment:        The Xpert SA Assay (FDA approved for NASAL specimens in patients over 56 years of age), is one component of a comprehensive surveillance program.  Test performance has been validated by [facility] for patients greater than or equal to 39 year old. It is not intended to diagnose infection nor to guide or monitor treatment.      Studies: No results found.  Scheduled Meds:  Scheduled Meds: . carvedilol  3.125 mg Oral BID WC  . chlorhexidine  15 mL Mouth/Throat BID  . furosemide  10 mg Intramuscular Once  . levothyroxine  25 mcg Oral QAC breakfast  . pantoprazole  40 mg Oral BID  . sucralfate  1 g Oral TID WC & HS   Continuous Infusions:    Time spent on care of this patient: 35 min   [place], MD 08/16/2014, 9:54 AM  LOS: 5 days   [facility] Office  [phone] Pager - Text Page per [email or web address] If 7PM-7AM, please contact night-coverage [email or web address]

## 2014-08-16 NOTE — Progress Notes (Signed)
PT Cancellation Note  Patient Details Name: Alexandra [name] MRN: [id number] DOB: May 07, 1927   Cancelled Treatment:    Reason Eval/Treat Not Completed: Fatigue/lethargy limiting ability to participate Pt's daughter in room and reports pt just ambulated. Pt falling asleep in recliner and declined PT at this time.   Alexandra [name],Alexandra [name] 08/16/2014, 10:39 AM Carmelia Bake, PT, DPT 08/16/2014 Pager: [phone]

## 2014-08-17 DIAGNOSIS — J189 Pneumonia, unspecified organism: Secondary | ICD-10-CM

## 2014-08-17 DIAGNOSIS — I447 Left bundle-branch block, unspecified: Secondary | ICD-10-CM

## 2014-08-17 LAB — COMPREHENSIVE METABOLIC PANEL
ALT: 22 U/L (ref 14–54)
AST: 25 U/L (ref 15–41)
Albumin: 3.4 g/dL — ABNORMAL LOW (ref 3.5–5.0)
Alkaline Phosphatase: 43 U/L (ref 38–126)
Anion gap: 8 (ref 5–15)
BUN: 34 mg/dL — ABNORMAL HIGH (ref 6–20)
CO2: 26 mmol/L (ref 22–32)
CREATININE: 1.33 mg/dL — AB (ref 0.44–1.00)
Calcium: 8.9 mg/dL (ref 8.9–10.3)
Chloride: 109 mmol/L (ref 101–111)
GFR, EST AFRICAN AMERICAN: 41 mL/min — AB (ref >60)
GFR, EST NON AFRICAN AMERICAN: 35 mL/min — AB (ref >60)
Glucose, Bld: 115 mg/dL — ABNORMAL HIGH (ref 65–99)
POTASSIUM: 4 mmol/L (ref 3.5–5.1)
Sodium: 143 mmol/L (ref 135–145)
Total Bilirubin: 0.6 mg/dL (ref 0.3–1.2)
Total Protein: 6.4 g/dL — ABNORMAL LOW (ref 6.5–8.1)

## 2014-08-17 LAB — CBC
HEMATOCRIT: 32.8 % — AB (ref 36.0–46.0)
HEMOGLOBIN: 10.3 g/dL — AB (ref 12.0–15.0)
MCH: 29.3 pg (ref 26.0–34.0)
MCHC: 31.4 g/dL (ref 30.0–36.0)
MCV: 93.2 fL (ref 78.0–100.0)
Platelets: 160 10*3/uL (ref 150–400)
RBC: 3.52 MIL/uL — AB (ref 3.87–5.11)
RDW: 14.9 % (ref 11.5–15.5)
WBC: 7.9 10*3/uL (ref 4.0–10.5)

## 2014-08-17 MED ORDER — CARVEDILOL 6.25 MG PO TABS: 6.2500 mg | ORAL_TABLET | Freq: Two times a day (BID) | ORAL | Status: DC

## 2014-08-17 MED ORDER — DOXYCYCLINE HYCLATE 100 MG PO TABS
100.0000 mg | ORAL_TABLET | Freq: Two times a day (BID) | ORAL | Status: DC
  Administered 2014-08-17: 100 mg via ORAL
  Filled 2014-08-17: qty 1

## 2014-08-17 MED ORDER — AMOXICILLIN-POT CLAVULANATE 875-125 MG PO TABS: 1.0000 | ORAL_TABLET | Freq: Two times a day (BID) | ORAL | Status: DC

## 2014-08-17 MED ORDER — FUROSEMIDE 10 MG/ML IJ SOLN: 40.0000 mg | Freq: Once | INTRAMUSCULAR | Status: DC

## 2014-08-17 MED ORDER — DOXYCYCLINE HYCLATE 100 MG PO TABS: 100.0000 mg | ORAL_TABLET | Freq: Two times a day (BID) | ORAL | Status: DC

## 2014-08-17 MED ORDER — POTASSIUM CHLORIDE ER 10 MEQ PO TBCR: 10.0000 meq | EXTENDED_RELEASE_TABLET | Freq: Every day | ORAL | Status: DC | PRN

## 2014-08-17 MED ORDER — FUROSEMIDE 20 MG PO TABS: 20.0000 mg | ORAL_TABLET | Freq: Every day | ORAL | Status: DC | PRN

## 2014-08-17 MED ORDER — AMOXICILLIN-POT CLAVULANATE 875-125 MG PO TABS
1.0000 | ORAL_TABLET | Freq: Two times a day (BID) | ORAL | Status: DC
  Administered 2014-08-17: 1 via ORAL
  Filled 2014-08-17: qty 1

## 2014-08-17 NOTE — Progress Notes (Signed)
CCMD called and reported that patient had a burse of SVT in the 150's.  Pt asymptomatic.  MD made aware.  Will continue to monitor closely.

## 2014-08-17 NOTE — Progress Notes (Signed)
SUBJECTIVE: Says breathing is a little shallow for which she was given Xanax with some relief. Denies chest pain. Says swelling in hands has improved. Denies fevers. Chest xray from 7/29 shows new infiltrate at right base.     Intake/Output Summary (Last 24 hours) at 08/17/14 0836 Last data filed at 08/16/14 1300  Gross per 24 hour  Intake    180 ml  Output    150 ml  Net     30 ml    Current Facility-Administered Medications  Medication Dose Route Frequency Provider Last Rate Last Dose  . albuterol (PROVENTIL) (2.5 MG/3ML) 0.083% nebulizer solution 2.5 mg  2.5 mg Nebulization Q4H PRN Etta Quill, DO   2.5 mg at 08/16/14 1753  . ALPRAZolam Duanne Moron) tablet 0.25 mg  0.25 mg Oral TID PRN Etta Quill, DO   0.25 mg at 08/17/14 0731  . carvedilol (COREG) tablet 6.25 mg  6.25 mg Oral BID WC Debbe Odea, MD      . chlorhexidine (PERIDEX) 0.12 % solution 15 mL  15 mL Mouth/Throat BID Etta Quill, DO   15 mL at 08/16/14 2157  . HYDROmorphone (DILAUDID) injection 0.5 mg  0.5 mg Intravenous Q2H PRN Stark Klein, MD   0.5 mg at 08/14/14 1603  . ibuprofen (ADVIL,MOTRIN) tablet 400 mg  400 mg Oral Q6H PRN Debbe Odea, MD      . levothyroxine (SYNTHROID, LEVOTHROID) tablet 25 mcg  25 mcg Oral QAC breakfast Etta Quill, DO   25 mcg at 08/16/14 0859  . ondansetron (ZOFRAN) injection 4 mg  4 mg Intravenous Q6H PRN Dionne Milo, NP   4 mg at 08/12/14 1938  . oxyCODONE-acetaminophen (PERCOCET/ROXICET) 5-325 MG per tablet 1-2 tablet  1-2 tablet Oral Q4H PRN Stark Klein, MD   2 tablet at 08/15/14 1053  . pantoprazole (PROTONIX) EC tablet 40 mg  40 mg Oral Daily Debbe Odea, MD        Filed Vitals:   08/16/14 1400 08/16/14 1754 08/16/14 2056 08/17/14 0519  BP: 116/66  123/59 139/79  Pulse: 102  107 105  Temp: 97.8 F (36.6 C)  98.3 F (36.8 C) 98.1 F (36.7 C)  TempSrc: Oral  Oral Oral  Resp: 20  20 20   Height:      Weight:    215 lb 6.2 oz (97.7 kg)  SpO2: 96%  92% 97% 98%    PHYSICAL EXAM General: NAD HEENT: Normal. Neck: No JVD, no thyromegaly.  Lungs: Diminished sounds at right base with crackles, no wheezes. CV: Tachycardic, regular rhythm, normal S1/split S2, no [id number], 1/6 systolic murmur along left sternal border.  Trivial pretibial edema. Normal pedal pulses.  Abdomen: Soft, nontender, no distention.  Neurologic: Alert and oriented x 3.  Psych: Normal affect. Musculoskeletal: No gross deformities. Extremities: No clubbing or cyanosis.   TELEMETRY: Reviewed telemetry pt in sinus tachycardia with underlying LBBB.  LABS: Basic Metabolic Panel:  Recent Labs  08/16/14 0429 08/16/14 0855 08/17/14 0450  NA 140  --  143  K 4.4  --  4.0  CL 109  --  109  CO2 25  --  26  GLUCOSE 101*  --  115*  BUN 38*  --  34*  CREATININE 1.32*  --  1.33*  CALCIUM 8.8*  --  8.9  MG  --  1.8  --    Liver Function Tests:  Recent Labs  08/16/14 0429 08/17/14 0450  AST 27 25  ALT 25 22  ALKPHOS 41 43  BILITOT 0.5 0.6  PROT 6.6 6.4*  ALBUMIN 3.5 3.4*   No results for input(s): LIPASE, AMYLASE in the last 72 hours. CBC:  Recent Labs  08/16/14 0429 08/17/14 0450  WBC 8.5 7.9  HGB 10.5* 10.3*  HCT 33.8* 32.8*  MCV 94.4 93.2  PLT 152 160   Cardiac Enzymes: No results for input(s): CKTOTAL, CKMB, CKMBINDEX, TROPONINI in the last 72 hours. BNP: Invalid input(s): POCBNP D-Dimer: No results for input(s): DDIMER in the last 72 hours. Hemoglobin A1C: No results for input(s): HGBA1C in the last 72 hours. Fasting Lipid Panel: No results for input(s): CHOL, HDL, LDLCALC, TRIG, CHOLHDL, LDLDIRECT in the last 72 hours. Thyroid Function Tests:  Recent Labs  08/16/14 0855  TSH 1.424   Anemia Panel: No results for input(s): VITAMINB12, FOLATE, FERRITIN, TIBC, IRON, RETICCTPCT in the last 72 hours.  RADIOLOGY: Dg Chest 2 View  08/16/2014   CLINICAL DATA:  Shortness of breath. History of left breast carcinoma  EXAM: CHEST  2 VIEW   COMPARISON:  August 10, 2014  FINDINGS: There remains persistent elevation left hemidiaphragm with atelectasis in the left base are region. There is new opacity in the lateral right base, felt to represent a small focus of pneumonia. Upper and mid lung zones are clear. Heart is mildly enlarged but stable. The pulmonary vascularity is normal. No adenopathy. Surgical clips in left breast region are again noted. There is degenerative change in the thoracic spine.  IMPRESSION: New infiltrate lateral right base. Persistent elevation left hemidiaphragm with left base atelectatic change. Stable cardiac prominence.   Electronically Signed   By: Lowella Grip III M.D.   On: 08/16/2014 10:58   Dg Chest 2 View  08/10/2014   CLINICAL DATA:  Intermittent shortness of breath  EXAM: CHEST  2 VIEW  COMPARISON:  April 27, 2013  FINDINGS: There is persistent elevation the left hemidiaphragm. There is patchy left base atelectasis. There is no appreciable edema or consolidation. The heart is slightly enlarged but stable. The pulmonary vascularity is normal. No adenopathy. There are surgical clips in the left breast region. There is degenerative change in thoracic spine.  IMPRESSION: Stable elevation left hemidiaphragm with left base atelectasis. No edema or consolidation. Heart prominent but stable. Pulmonary vascularity within normal limits.   Electronically Signed   By: Lowella Grip III M.D.   On: 08/10/2014 20:24   Nm Hepatobiliary Liver Func  08/11/2014   CLINICAL DATA:  Predominantly lower abdominal pain and nausea for 3 weeks. No vomiting. Possible acute cholecystitis.  EXAM: NUCLEAR MEDICINE HEPATOBILIARY IMAGING  TECHNIQUE: Sequential images of the abdomen were obtained out to 60 minutes following intravenous administration of radiopharmaceutical.  RADIOPHARMACEUTICALS:  5.0 mCi Tc-19m  Choletec IV  COMPARISON:  Right upper quadrant ultrasound 08/10/2014  FINDINGS: There is prompt radiotracer uptake by the liver  with excretion into the biliary system. Gallbladder activity is identified by 20 minutes with progressive accumulation. Radiotracer excretion into the small bowel is identified during the second hour of imaging.  IMPRESSION: Patent cystic duct without evidence of acute cholecystitis.   Electronically Signed   By: Logan Bores   On: 08/11/2014 11:49   Ct Abdomen Pelvis W Contrast  08/10/2014   CLINICAL DATA:  Subacute onset of mid and upper abdominal pain for 2 weeks. Throat burning. Nausea. Decreased appetite and oral intake. Initial encounter.  EXAM: CT ABDOMEN AND PELVIS WITH CONTRAST  TECHNIQUE: Multidetector CT imaging of the abdomen  and pelvis was performed using the standard protocol following bolus administration of intravenous contrast.  CONTRAST:  127mL OMNIPAQUE IOHEXOL 300 MG/ML  SOLN  COMPARISON:  CT of the pelvis performed 02/19/2014, and pelvic ultrasound performed 10/14/2003  FINDINGS: Small bilateral pleural effusions are noted. The heart is mildly enlarged. Scattered coronary artery calcifications are seen.  Pericholecystic fluid is noted, and a stone is noted within the gallbladder. This raises question for mild acute cholecystitis. Would correlate for associated symptoms. A 1.2 cm cystic focus within the left hepatic lobe is of uncertain significance. Trace ascites is noted tracking about the inferior tip of the liver.  The visualized portions of the spleen are grossly unremarkable. The pancreas demonstrates minimal surrounding soft tissue stranding, though this remains nonspecific. There is mild stranding within the small bowel mesentery, with scattered associated borderline prominent nodes. The adrenal glands are unremarkable in appearance.  Mild calcification adjacent to the left adrenal gland may be vascular in nature.  Mild bilateral renal atrophy is noted. Nonspecific perinephric stranding is noted bilaterally. A few scattered small bilateral renal cysts are seen. There is no evidence of  hydronephrosis. No renal or ureteral stones are identified.  The small bowel is unremarkable in appearance. The stomach is within normal limits. No acute vascular abnormalities are seen. Scattered calcification is noted along the abdominal aorta.  The appendix is normal in caliber, without evidence of appendicitis. Trace fluid is noted along the paracolic gutters bilaterally, more prominent on the right. Minimal diverticulosis is noted at the proximal sigmoid colon. The colon is otherwise unremarkable.  The bladder is decompressed and not well seen. A small amount of free fluid is noted within the pelvis. The uterus is grossly unremarkable in appearance. The ovaries are relatively symmetric. No suspicious adnexal masses are seen. No inguinal lymphadenopathy is seen.  No acute osseous abnormalities are identified. There is mild chronic loss of height at the superior endplate of vertebral body L4. The patient is status post decompression at L3 and L4.  IMPRESSION: 1. Pericholecystic fluid noted, with cholelithiasis. Though this is nonspecific given underlying trace ascites, it raises question for mild acute cholecystitis. Would correlate for associated symptoms. 2. Nonspecific minimal soft tissue stranding about the pancreas, without definite evidence for pancreatitis. Mild stranding is also noted within the adjacent small bowel mesentery, with associated borderline prominent nodes. This may reflect the patient's baseline. 3. Trace ascites noted about the liver and within the pelvis. 4. Small bilateral pleural effusions noted. 5. Mild cardiomegaly. Scattered coronary artery calcifications seen. 6. Nonspecific 1.2 cm cystic focus within the left hepatic lobe. 7. Mild bilateral renal atrophy noted. Scattered bilateral renal cysts seen. 8. Scattered calcification along the abdominal aorta. 9. Minimal diverticulosis of the proximal sigmoid colon, without evidence of diverticulitis. 10. Mild chronic loss of height at the  superior endplate of vertebral body L4.   Electronically Signed   By: Garald Balding M.D.   On: 08/10/2014 22:30   US Abdomen Limited Ruq  08/11/2014   CLINICAL DATA:  81 year old female with right upper quadrant abdominal pain  EXAM: US ABDOMEN LIMITED - RIGHT UPPER QUADRANT  COMPARISON:  CT dated 08/10/2014  FINDINGS: Gallbladder:  There is a 1.2 cm stone in the gallbladder. There is thickening of the anterior gallbladder wall measuring up to 1 cm. No significant pericholecystic fluid. No tenderness was elicited over the gallbladder area during scanning.  Common bile duct:  Diameter: 4 mm  Liver:  No focal lesion identified. Within normal limits in  parenchymal echogenicity.  A right-sided pleural effusion is partially visualized.  IMPRESSION: Cholelithiasis with thickened anterior gallbladder wall. Findings may represent acute cholecystitis. A hepatobiliary scintigraphy may provide better evaluation of the gallbladder if an acute cholecystitis is clinically suspected.   Electronically Signed   By: Anner Crete M.D.   On: 08/11/2014 00:05      ASSESSMENT AND PLAN: 1. Shortness of breath with new right base infiltrate/pneumonia: Likely explains shallow breathing as reported by patient, with some contribution from low LVEF. Discussed with Dr. Wynelle Cleveland, who plans to start antimicrobial therapy. No fevers or leukocytosis.  2. Cardiomyopathy ([place]): No significant volume overload. Given propensity for dehydration with infection, would favor prn oral Lasix for worsening lower extremity edema and/or significant weight gain rather than scheduled Lasix. Family members will be there for her support and can assist with management. Would recommend increasing carvedilol to 6.25 mg bid rather than adding ACEI at this time (sinus tachycardia, likely being driven by pneumonia). Can consider ACEI as outpatient (will defer to Dr. Marlou Porch as per timing). Recommend f/u echo in 2-3 months to assess for interval  improvement.    Kate Sable, M.D., F.A.C.C.

## 2014-08-17 NOTE — Progress Notes (Signed)
Discharge summary went over with patient and family.  Explained importance of taking medications as prescribed and going to follow up appointments.  Gave HF packet and did some HF education.  All questions answered.  Pt will be wheeled out by NT.

## 2014-08-17 NOTE — Care Management Note (Signed)
Case Management Note  Patient Details  Name: JONNITA THALACKER MRN: [id number] Date of Birth: May 22, 1927  Subjective/Objective:      Abdominal pain             Action/Plan: Home Health  Expected Discharge Date:  08/17/2014               Expected Discharge Plan:  [place]  In-House Referral:     Discharge planning Services  CM Consult  Post Acute Care Choice:  Home Health Choice offered to:  Patient  DME Arranged:    DME Agency:     HH Arranged:  PT, Nurse's Aide, RN [place] Agency:  [facility]  Status of Service:  Completed, signed off  Medicare Important Message Given:  Yes-third notification given Date Medicare IM Given:    Medicare IM give by:    Date Additional Medicare IM Given:    Additional Medicare Important Message give by:     If discussed at [place] of Stay Meetings, dates discussed:    Additional Comments: Contacted [facility] to make aware of scheduled dc today with HH RN, PT and aide. Dtr states pt has RW and cane at home.  Erenest Rasher, RN 08/17/2014, 2:41 PM

## 2014-08-17 NOTE — Progress Notes (Signed)
RT called to PT room- PT has just been given anxiety medication (per PT). PT states she is breathing better and thinks it was anxiety and didn't sleep well last night. RT encouraged PT to notify staff if breathing changes and needs intervention from RT. RT ordered a bed side fan for PT.

## 2014-08-19 ENCOUNTER — Inpatient Hospital Stay (HOSPITAL_COMMUNITY)
Admission: EM | Admit: 2014-08-19 | Discharge: 2014-08-23 | DRG: 175 | Disposition: A | Discharge status: Home / Self Care | Payer: Commercial Managed Care - HMO | Attending: Internal Medicine | Admitting: Internal Medicine

## 2014-08-19 ENCOUNTER — Inpatient Hospital Stay (HOSPITAL_COMMUNITY): Payer: Commercial Managed Care - HMO

## 2014-08-19 ENCOUNTER — Encounter (HOSPITAL_COMMUNITY): Payer: Self-pay | Admitting: Emergency Medicine

## 2014-08-19 ENCOUNTER — Emergency Department (HOSPITAL_COMMUNITY): Payer: Commercial Managed Care - HMO

## 2014-08-19 DIAGNOSIS — Z853 Personal history of malignant neoplasm of breast: Secondary | ICD-10-CM

## 2014-08-19 DIAGNOSIS — J189 Pneumonia, unspecified organism: Secondary | ICD-10-CM | POA: Diagnosis not present

## 2014-08-19 DIAGNOSIS — M1990 Unspecified osteoarthritis, unspecified site: Secondary | ICD-10-CM | POA: Diagnosis present

## 2014-08-19 DIAGNOSIS — Z833 Family history of diabetes mellitus: Secondary | ICD-10-CM

## 2014-08-19 DIAGNOSIS — R600 Localized edema: Secondary | ICD-10-CM

## 2014-08-19 DIAGNOSIS — Z800 Family history of malignant neoplasm of digestive organs: Secondary | ICD-10-CM

## 2014-08-19 DIAGNOSIS — G609 Hereditary and idiopathic neuropathy, unspecified: Secondary | ICD-10-CM | POA: Diagnosis present

## 2014-08-19 DIAGNOSIS — D638 Anemia in other chronic diseases classified elsewhere: Secondary | ICD-10-CM | POA: Diagnosis not present

## 2014-08-19 DIAGNOSIS — I2699 Other pulmonary embolism without acute cor pulmonale: Secondary | ICD-10-CM | POA: Diagnosis not present

## 2014-08-19 DIAGNOSIS — I429 Cardiomyopathy, unspecified: Secondary | ICD-10-CM | POA: Diagnosis not present

## 2014-08-19 DIAGNOSIS — R072 Precordial pain: Secondary | ICD-10-CM | POA: Diagnosis not present

## 2014-08-19 DIAGNOSIS — Y95 Nosocomial condition: Secondary | ICD-10-CM | POA: Diagnosis present

## 2014-08-19 DIAGNOSIS — Z9049 Acquired absence of other specified parts of digestive tract: Secondary | ICD-10-CM | POA: Diagnosis present

## 2014-08-19 DIAGNOSIS — N183 Chronic kidney disease, stage 3 unspecified: Secondary | ICD-10-CM | POA: Diagnosis present

## 2014-08-19 DIAGNOSIS — Z823 Family history of stroke: Secondary | ICD-10-CM

## 2014-08-19 DIAGNOSIS — Z888 Allergy status to other drugs, medicaments and biological substances status: Secondary | ICD-10-CM

## 2014-08-19 DIAGNOSIS — I447 Left bundle-branch block, unspecified: Secondary | ICD-10-CM | POA: Diagnosis present

## 2014-08-19 DIAGNOSIS — J45909 Unspecified asthma, uncomplicated: Secondary | ICD-10-CM | POA: Diagnosis present

## 2014-08-19 DIAGNOSIS — I509 Heart failure, unspecified: Secondary | ICD-10-CM

## 2014-08-19 DIAGNOSIS — I5043 Acute on chronic combined systolic (congestive) and diastolic (congestive) heart failure: Secondary | ICD-10-CM | POA: Diagnosis not present

## 2014-08-19 DIAGNOSIS — Z79899 Other long term (current) drug therapy: Secondary | ICD-10-CM

## 2014-08-19 DIAGNOSIS — E780 Pure hypercholesterolemia: Secondary | ICD-10-CM | POA: Diagnosis present

## 2014-08-19 DIAGNOSIS — Z7982 Long term (current) use of aspirin: Secondary | ICD-10-CM | POA: Diagnosis not present

## 2014-08-19 DIAGNOSIS — E669 Obesity, unspecified: Secondary | ICD-10-CM | POA: Diagnosis present

## 2014-08-19 DIAGNOSIS — I10 Essential (primary) hypertension: Secondary | ICD-10-CM | POA: Diagnosis present

## 2014-08-19 DIAGNOSIS — Z79891 Long term (current) use of opiate analgesic: Secondary | ICD-10-CM

## 2014-08-19 DIAGNOSIS — I82403 Acute embolism and thrombosis of unspecified deep veins of lower extremity, bilateral: Secondary | ICD-10-CM | POA: Diagnosis not present

## 2014-08-19 DIAGNOSIS — E039 Hypothyroidism, unspecified: Secondary | ICD-10-CM | POA: Diagnosis present

## 2014-08-19 DIAGNOSIS — Z6833 Body mass index (BMI) 33.0-33.9, adult: Secondary | ICD-10-CM

## 2014-08-19 DIAGNOSIS — M8580 Other specified disorders of bone density and structure, unspecified site: Secondary | ICD-10-CM | POA: Diagnosis present

## 2014-08-19 DIAGNOSIS — R2681 Unsteadiness on feet: Secondary | ICD-10-CM | POA: Diagnosis not present

## 2014-08-19 DIAGNOSIS — J90 Pleural effusion, not elsewhere classified: Secondary | ICD-10-CM | POA: Diagnosis not present

## 2014-08-19 DIAGNOSIS — Z5189 Encounter for other specified aftercare: Secondary | ICD-10-CM | POA: Diagnosis not present

## 2014-08-19 DIAGNOSIS — I129 Hypertensive chronic kidney disease with stage 1 through stage 4 chronic kidney disease, or unspecified chronic kidney disease: Secondary | ICD-10-CM | POA: Diagnosis present

## 2014-08-19 DIAGNOSIS — R278 Other lack of coordination: Secondary | ICD-10-CM | POA: Diagnosis not present

## 2014-08-19 DIAGNOSIS — R05 Cough: Secondary | ICD-10-CM | POA: Diagnosis not present

## 2014-08-19 DIAGNOSIS — R0600 Dyspnea, unspecified: Secondary | ICD-10-CM

## 2014-08-19 DIAGNOSIS — R531 Weakness: Secondary | ICD-10-CM | POA: Diagnosis not present

## 2014-08-19 DIAGNOSIS — R404 Transient alteration of awareness: Secondary | ICD-10-CM | POA: Diagnosis not present

## 2014-08-19 DIAGNOSIS — J9601 Acute respiratory failure with hypoxia: Secondary | ICD-10-CM | POA: Diagnosis not present

## 2014-08-19 DIAGNOSIS — M6281 Muscle weakness (generalized): Secondary | ICD-10-CM | POA: Diagnosis not present

## 2014-08-19 DIAGNOSIS — I5181 Takotsubo syndrome: Secondary | ICD-10-CM | POA: Diagnosis present

## 2014-08-19 DIAGNOSIS — Z820 Family history of epilepsy and other diseases of the nervous system: Secondary | ICD-10-CM

## 2014-08-19 DIAGNOSIS — J9811 Atelectasis: Secondary | ICD-10-CM | POA: Diagnosis not present

## 2014-08-19 DIAGNOSIS — R0602 Shortness of breath: Secondary | ICD-10-CM | POA: Diagnosis not present

## 2014-08-19 HISTORY — DX: Heart failure, unspecified: I50.9

## 2014-08-19 LAB — BASIC METABOLIC PANEL
Anion gap: 8 (ref 5–15)
BUN: 26 mg/dL — ABNORMAL HIGH (ref 6–20)
CHLORIDE: 105 mmol/L (ref 101–111)
CO2: 26 mmol/L (ref 22–32)
Calcium: 9.3 mg/dL (ref 8.9–10.3)
Creatinine, Ser: 1.1 mg/dL — ABNORMAL HIGH (ref 0.44–1.00)
GFR calc Af Amer: 52 mL/min — ABNORMAL LOW (ref >60)
GFR, EST NON AFRICAN AMERICAN: 44 mL/min — AB (ref >60)
GLUCOSE: 124 mg/dL — AB (ref 65–99)
Potassium: 4.7 mmol/L (ref 3.5–5.1)
Sodium: 139 mmol/L (ref 135–145)

## 2014-08-19 LAB — URINALYSIS, ROUTINE W REFLEX MICROSCOPIC
BILIRUBIN URINE: NEGATIVE
Glucose, UA: NEGATIVE mg/dL
Ketones, ur: NEGATIVE mg/dL
NITRITE: NEGATIVE
Protein, ur: NEGATIVE mg/dL
SPECIFIC GRAVITY, URINE: 1.022 (ref 1.005–1.030)
UROBILINOGEN UA: 0.2 mg/dL (ref 0.0–1.0)
pH: 5 (ref 5.0–8.0)

## 2014-08-19 LAB — HEPATIC FUNCTION PANEL
ALBUMIN: 3.4 g/dL — AB (ref 3.5–5.0)
ALT: 20 U/L (ref 14–54)
AST: 24 U/L (ref 15–41)
Alkaline Phosphatase: 39 U/L (ref 38–126)
BILIRUBIN DIRECT: 0.2 mg/dL (ref 0.1–0.5)
BILIRUBIN INDIRECT: 0.6 mg/dL (ref 0.3–0.9)
BILIRUBIN TOTAL: 0.8 mg/dL (ref 0.3–1.2)
Total Protein: 6.5 g/dL (ref 6.5–8.1)

## 2014-08-19 LAB — PROTIME-INR
INR: 1.51 — ABNORMAL HIGH (ref 0.00–1.49)
PROTHROMBIN TIME: 18.3 s — AB (ref 11.6–15.2)

## 2014-08-19 LAB — CBG MONITORING, ED: GLUCOSE-CAPILLARY: 109 mg/dL — AB (ref 65–99)

## 2014-08-19 LAB — CBC
HEMATOCRIT: 35.1 % — AB (ref 36.0–46.0)
HEMOGLOBIN: 11 g/dL — AB (ref 12.0–15.0)
MCH: 28.5 pg (ref 26.0–34.0)
MCHC: 31.3 g/dL (ref 30.0–36.0)
MCV: 90.9 fL (ref 78.0–100.0)
PLATELETS: 171 10*3/uL (ref 150–400)
RBC: 3.86 MIL/uL — ABNORMAL LOW (ref 3.87–5.11)
RDW: 14.9 % (ref 11.5–15.5)
WBC: 8.4 10*3/uL (ref 4.0–10.5)

## 2014-08-19 LAB — APTT: aPTT: 28 seconds (ref 24–37)

## 2014-08-19 LAB — I-STAT TROPONIN, ED: Troponin i, poc: 0.01 ng/mL (ref 0.00–0.08)

## 2014-08-19 LAB — D-DIMER, QUANTITATIVE: D-Dimer, Quant: 8.14 ug/mL-FEU — ABNORMAL HIGH (ref 0.00–0.48)

## 2014-08-19 LAB — URINE MICROSCOPIC-ADD ON

## 2014-08-19 LAB — BRAIN NATRIURETIC PEPTIDE: B Natriuretic Peptide: 1627.6 pg/mL — ABNORMAL HIGH (ref 0.0–100.0)

## 2014-08-19 LAB — LACTIC ACID, PLASMA

## 2014-08-19 MED ORDER — ENOXAPARIN SODIUM 40 MG/0.4ML ~~LOC~~ SOLN
40.0000 mg | SUBCUTANEOUS | Status: DC
  Filled 2014-08-19: qty 0.4

## 2014-08-19 MED ORDER — PIPERACILLIN-TAZOBACTAM 3.375 G IVPB
3.3750 g | Freq: Three times a day (TID) | INTRAVENOUS | Status: DC
  Administered 2014-08-19 – 2014-08-22 (×8): 3.375 g via INTRAVENOUS
  Filled 2014-08-19 (×7): qty 50

## 2014-08-19 MED ORDER — ALPRAZOLAM 0.25 MG PO TABS
0.2500 mg | ORAL_TABLET | Freq: Three times a day (TID) | ORAL | Status: DC | PRN
  Administered 2014-08-20 – 2014-08-23 (×5): 0.25 mg via ORAL
  Filled 2014-08-19 (×5): qty 1

## 2014-08-19 MED ORDER — PIPERACILLIN-TAZOBACTAM 3.375 G IVPB 30 MIN
3.3750 g | Freq: Once | INTRAVENOUS | Status: AC
Start: 2014-08-19 — End: 2014-08-19
  Administered 2014-08-19: 3.375 g via INTRAVENOUS
  Filled 2014-08-19: qty 50

## 2014-08-19 MED ORDER — LEVALBUTEROL HCL 0.63 MG/3ML IN NEBU: 0.6300 mg | INHALATION_SOLUTION | RESPIRATORY_TRACT | Status: DC | PRN

## 2014-08-19 MED ORDER — ASPIRIN 81 MG PO CHEW
324.0000 mg | CHEWABLE_TABLET | Freq: Once | ORAL | Status: AC
  Administered 2014-08-19: 324 mg via ORAL
  Filled 2014-08-19: qty 4

## 2014-08-19 MED ORDER — HEPARIN BOLUS VIA INFUSION
4000.0000 [IU] | Freq: Once | INTRAVENOUS | Status: AC
  Administered 2014-08-19: 4000 [IU] via INTRAVENOUS
  Filled 2014-08-19: qty 4000

## 2014-08-19 MED ORDER — LEVOTHYROXINE SODIUM 25 MCG PO TABS
25.0000 ug | ORAL_TABLET | Freq: Every day | ORAL | Status: DC
  Administered 2014-08-20 – 2014-08-23 (×4): 25 ug via ORAL
  Filled 2014-08-19 (×5): qty 1

## 2014-08-19 MED ORDER — OXYCODONE-ACETAMINOPHEN 5-325 MG PO TABS
1.0000 | ORAL_TABLET | Freq: Four times a day (QID) | ORAL | Status: DC | PRN
  Administered 2014-08-22: 1 via ORAL
  Filled 2014-08-19: qty 1

## 2014-08-19 MED ORDER — HEPARIN (PORCINE) IN NACL 100-0.45 UNIT/ML-% IJ SOLN
1250.0000 [IU]/h | INTRAMUSCULAR | Status: DC
  Administered 2014-08-19: 1250 [IU]/h via INTRAVENOUS
  Filled 2014-08-19: qty 250

## 2014-08-19 MED ORDER — IOHEXOL 350 MG/ML SOLN
100.0000 mL | Freq: Once | INTRAVENOUS | Status: AC | PRN
  Administered 2014-08-19: 80 mL via INTRAVENOUS

## 2014-08-19 MED ORDER — MORPHINE SULFATE 4 MG/ML IJ SOLN
4.0000 mg | Freq: Once | INTRAMUSCULAR | Status: AC
  Administered 2014-08-19: 4 mg via INTRAVENOUS
  Filled 2014-08-19: qty 1

## 2014-08-19 MED ORDER — TRAMADOL HCL 50 MG PO TABS: 50.0000 mg | ORAL_TABLET | Freq: Four times a day (QID) | ORAL | Status: DC | PRN

## 2014-08-19 MED ORDER — ONDANSETRON HCL 4 MG/2ML IJ SOLN
4.0000 mg | Freq: Once | INTRAMUSCULAR | Status: AC
  Administered 2014-08-19: 4 mg via INTRAVENOUS
  Filled 2014-08-19: qty 2

## 2014-08-19 MED ORDER — SODIUM CHLORIDE 0.9 % IV BOLUS (SEPSIS)
250.0000 mL | Freq: Once | INTRAVENOUS | Status: AC
  Administered 2014-08-19: 250 mL via INTRAVENOUS

## 2014-08-19 MED ORDER — ASPIRIN EC 81 MG PO TBEC
81.0000 mg | DELAYED_RELEASE_TABLET | Freq: Every day | ORAL | Status: DC
  Administered 2014-08-20 – 2014-08-23 (×4): 81 mg via ORAL
  Filled 2014-08-19 (×4): qty 1

## 2014-08-19 MED ORDER — VANCOMYCIN HCL IN DEXTROSE 750-5 MG/150ML-% IV SOLN
750.0000 mg | Freq: Two times a day (BID) | INTRAVENOUS | Status: DC
  Administered 2014-08-19 – 2014-08-22 (×6): 750 mg via INTRAVENOUS
  Filled 2014-08-19 (×9): qty 150

## 2014-08-19 MED ORDER — FUROSEMIDE 10 MG/ML IJ SOLN
20.0000 mg | Freq: Once | INTRAMUSCULAR | Status: AC
  Administered 2014-08-19: 20 mg via INTRAVENOUS
  Filled 2014-08-19: qty 4

## 2014-08-19 NOTE — ED Notes (Signed)
Transport to [facility] delayed , admitting MD at bedside.

## 2014-08-19 NOTE — ED Notes (Addendum)
Transfer delayed. Per Dr Roel Cluck, pt will have Ct exam in ER prior to transfer to floor , by family request. This RN called floor and spoke to receiving nurse explaining delay reason.

## 2014-08-19 NOTE — ED Notes (Signed)
Bed: WA03 Expected date:  Expected time:  Means of arrival:  Comments: EMS-weakness

## 2014-08-19 NOTE — Progress Notes (Signed)
EDCM noted patient has been admitted and discharged from the hospital from 07/23 to 07/30.  Patient discharged to home with home health services with [facility] for RN, PT and aide.  Patient with [facility] insurance pcp Dr. Lona Kettle.

## 2014-08-19 NOTE — H&P (Signed)
PCP:   Melinda Crutch, MD  Cardiology [facility] Neurology [facility] [place]  Referring provider [place]   Chief Complaint: Shortness of breath  HPI: Alexandra [name] is a 81 y.o. female   has a past medical history of Arthritis; Headache(784.0); Anxiety; Depression; Asthma; CAP (community acquired pneumonia) (12/19/2012); Intermittent LBBB (left bundle branch block) (12/21/2012); Obesity; Peripheral neuropathy; Sacroiliitis, not elsewhere classified; Osteopenia; Hypercholesteremia; Neuropathy; Trigger finger; Chest pain; DOE (dyspnea on exertion); Fatigue; breast ca (dx'd 2000); Essential (primary) hypertension (04/02/2014); Adult hypothyroidism (04/02/2014); Hereditary and idiopathic peripheral neuropathy (07/02/2014); Leg weakness; CKD (chronic kidney disease) stage 3, GFR 30-59 ml/min (08/13/2014); Anemia of chronic disease (08/13/2014); and CHF (congestive heart failure) (08/19/2014).   Patient has been recently admitted with abdominal pain was found to have acute cholecystitis and undergone laparoscopic cholecystectomy on 26 of July. Patient has suspected Takotsubo cardiomyopathy secondary to recent death of husband. Last echogram showed EF of 20% patient was seen during prior admission by cardiology consult who recommended starting low-dose Coreg patient was gently diuresed while hospitalized. There was concern of over diuresis the patient was discharged on as needed Lasix only. ACE inhibitor was held due to concern of history of chronic kidney disease and patient was supposed to follow-up with her primary cardiologist to see if she could tolerate it. Hospital stay was complicated by SVT for which her Coreg dose has been adjusted up. The day of the discharge she was found to have a new right base infiltrate which for her to be secondary to HCAP given that infiltrate was small and patient had no oxygen requirement decision was made to discharge her on by mouth antibiotics.  Patient was  started on antibiotics at the time of discharge and was discharged in doxycycline and Augmentin.  Patient was having   shortness of breath during hospitalization which was thought partially secondary to HCAP vs CHF. Even prior to admission she have had short episodes of mild dyspnea but never as bad.  On July 30 patient was discharged home. Patient continued to have significant shortness of breath. She was wheezing slightly their fried RT came over and  gave her breathing treatments that help a bit but few hours later it got worse.  Today she was seen Bear who felt that the patient "looked bad" . Her BP was elevated  SBP in 150's family. Pulse oxymeter was reading in 87%. Patietn has been told in the past that she has "asthma".   Family called EMS of generalize weakness some nausea and some worsening shortness of breath. She has persistent cough of yellow thick sputum mites been going on for almost a week. It was present during her hospitalization. She denies any fevers or chills. Reports significant shortness of breath she is unable to walk due to severe shortness of breath.   In emergency department chest x-ray showed evidence of CHF could not rule out superimposed pneumonia at the bases chest x-ray has been personally reviewed by me and does appear to be "wet'. BNP was noted to be elevated 1627 up form baseline of 1100 during admission. In emergency department patient was given 20 mg of Lasix IV she did not endorse significant improvement. Per family at the time of discharge she was instructed to actually only take Lasix if she needs it and haven't been taken in the past 2 days. Since her discharge her swelling has improved but shortness of breath when he got worse. Of note d-dimer was significantly elevated.  Given recent HCAP diagnosis and worsening shortness of breath in ER patient was given a dose of vancomycin and Zosyn  Patient has prior history of breast cancer but has been in  remission. She has chronic kidney disease stage III and anemia of chronic disease With baseline hemoglobin of 10. Of note patient has history of neuropathy and has been seen in the past by neurology due to possible proximal muscle weakness.  Hospitalist was called for admission for CHF exacerbation  Review of Systems:    Pertinent positives include:   shortness of breath at rest.  dyspnea on exertion, non-productive cough,  Constitutional:  No weight loss, night sweats, Fevers, chills, fatigue, weight loss  HEENT:  No headaches, Difficulty swallowing,Tooth/dental problems,Sore throat,  No sneezing, itching, ear ache, nasal congestion, post nasal drip,  Cardio-vascular:  No chest pain, Orthopnea, PND, anasarca, dizziness, palpitations.no Bilateral lower extremity swelling  GI:  No heartburn, indigestion, abdominal pain, nausea, vomiting, diarrhea, change in bowel habits, loss of appetite, melena, blood in stool, hematemesis Resp:  no No excess mucus, no productive cough, No  No coughing up of blood.No change in color of mucus.No wheezing. Skin:  no rash or lesions. No jaundice GU:  no dysuria, change in color of urine, no urgency or frequency. No straining to urinate.  No flank pain.  Musculoskeletal:  No joint pain or no joint swelling. No decreased range of motion. No back pain.  Psych:  No change in mood or affect. No depression or anxiety. No memory loss.  Neuro: no localizing neurological complaints, no tingling, no weakness, no double vision, no gait abnormality, no slurred speech, no confusion  Otherwise ROS are negative except for above, 10 systems were reviewed  Past Medical History: Past Medical History  Diagnosis Date  . Arthritis   . Headache(784.0)   . Anxiety   . Depression   . Asthma   . CAP (community acquired pneumonia) 12/19/2012  . Intermittent LBBB (left bundle branch block) 12/21/2012  . Obesity   . Peripheral neuropathy   . Sacroiliitis, not elsewhere  classified   . Osteopenia   . Hypercholesteremia   . Neuropathy   . Trigger finger     [name]  . Chest pain     NM stress test 05/2011 Normal  . DOE (dyspnea on exertion)     No SOB at rest  . Fatigue     excertional  . breast ca dx'd 2000    left  . Essential (primary) hypertension 04/02/2014  . Adult hypothyroidism 04/02/2014  . Hereditary and idiopathic peripheral neuropathy 07/02/2014  . Leg weakness   . CKD (chronic kidney disease) stage 3, GFR 30-59 ml/min 08/13/2014  . Anemia of chronic disease 08/13/2014  . CHF (congestive heart failure) 08/19/2014   Past Surgical History  Procedure Laterality Date  . Leg surgery    . Breast lumpectomy      breast cancer  . Lumbar laminectomy    . Achilles tendon surgery    . Tonsillectomy    . Vesicovaginal fistula closure w/ tah      DC hysterectomy  . Polypectomy    . Cholecystectomy N/A 08/13/2014    Procedure: LAPAROSCOPIC CHOLECYSTECTOMY;  Surgeon: Stark Klein, MD;  Location: [facility];  Service: General;  Laterality: N/A;     Medications: Prior to Admission medications   Medication Sig Start Date End Date Taking? Authorizing Provider  alendronate (FOSAMAX) 70 MG tablet Take 70 mg by mouth every Saturday. Take with a full  glass of water on an empty stomach.   Yes Historical Provider, MD  ALPRAZolam (XANAX) 0.25 MG tablet TK 1 T PO TID PRN anxiety 07/19/14  Yes Historical Provider, MD  amoxicillin-clavulanate (AUGMENTIN) 875-125 MG per tablet Take 1 tablet by mouth 2 (two) times daily. 08/17/14  Yes Debbe Odea, MD  aspirin EC 81 MG tablet Take 81 mg by mouth daily.   Yes Historical Provider, MD  b complex vitamins tablet Take 1 tablet by mouth daily.   Yes Historical Provider, MD  Calcium-Vitamin D (CALTRATE 600 PLUS-VIT D PO) Take 1 tablet by mouth 2 (two) times daily.   Yes Historical Provider, MD  carvedilol (COREG) 6.25 MG tablet Take 1 tablet (6.25 mg total) by mouth 2 (two) times daily with a meal. 08/17/14  Yes Debbe Odea,  MD  cholecalciferol (VITAMIN D) 1000 UNITS tablet Take 1,000 Units by mouth daily.   Yes Historical Provider, MD  doxycycline (VIBRA-TABS) 100 MG tablet Take 1 tablet (100 mg total) by mouth every 12 (twelve) hours. 08/17/14  Yes Debbe Odea, MD  furosemide (LASIX) 20 MG tablet Take 1 tablet (20 mg total) by mouth daily as needed for fluid or edema. 08/17/14  Yes Debbe Odea, MD  levothyroxine (SYNTHROID, LEVOTHROID) 25 MCG tablet Take 25 mcg by mouth daily before breakfast.   Yes Historical Provider, MD  Multiple Vitamin (MULITIVITAMIN WITH MINERALS) TABS Take 1 tablet by mouth daily.   Yes Historical Provider, MD  naproxen sodium (ANAPROX) 220 MG tablet Take 220 mg by mouth 2 (two) times daily as needed (for arthritis pain).    Yes Historical Provider, MD  nitroGLYCERIN (NITROSTAT) 0.4 MG SL tablet Place 0.4 mg under the tongue every 5 (five) minutes as needed for chest pain.   Yes Historical Provider, MD  omega-3 acid ethyl esters (LOVAZA) 1 G capsule Take 1 g by mouth daily.   Yes Historical Provider, MD  oxyCODONE-acetaminophen (PERCOCET/ROXICET) 5-325 MG per tablet Take 1 tablet by mouth every 6 (six) hours as needed for severe pain. 08/15/14  Yes Debbe Odea, MD  potassium chloride (K-DUR) 10 MEQ tablet Take 1 tablet (10 mEq total) by mouth daily as needed (to take with Lasix). 08/17/14  Yes Debbe Odea, MD  traMADol (ULTRAM) 50 MG tablet Take 1 tablet (50 mg total) by mouth every 6 (six) hours as needed for moderate pain. 04/01/14  Yes Dickie La, MD  VENTOLIN HFA 108 (90 BASE) MCG/ACT inhaler INhaLe 2 PufFS by mouth every 4 hours as needed for shortness of breath or wheezing 07/19/14  Yes Historical Provider, MD    Allergies:   Allergies  Allergen Reactions  . Cymbalta [Duloxetine Hcl] Other (See Comments)    Depression  . Lyrica [Pregabalin] Other (See Comments)    Blurry vision    Social History:  Ambulatory   independently recently sitting maintaining a chair unable to move due  to dyspnea Lives at home   With family     reports that she has never smoked. She has never used smokeless tobacco. She reports that she does not drink alcohol or use illicit drugs.    Family History: family history includes Alzheimer's disease in her sister; CVA in her daughter; Diabetes in her brother; Pancreatic cancer in her father; Pneumonia in her mother; Pneumonia (age of onset: 76) in her mother.    Physical Exam: Patient Vitals for the past 24 hrs:  BP Temp Temp src Pulse Resp SpO2 Height Weight  08/19/14 1743 117/85 mmHg 97.7 F (  36.5 C) Oral 103 17 100 % - -  08/19/14 1701 - - - - - - 5\' 4"  (1.626 m) 90.719 kg (200 lb)  08/19/14 1433 118/71 mmHg 97.8 F (36.6 C) Oral 105 17 100 % - -    1. General:  in No Acute distress 2. Psychological: Alert and Oriented 3. Head/ENT:   Moist Mucous Membranes                          Head Non traumatic, neck supple                          Normal   Dentition 4. SKIN: normal   Skin turgor,  Skin clean Dry and intact no rash 5. Heart: Regular rate and rhythm no Murmur, Rub or gallop 6. Lungs:   no wheezes mild crackles  Diminished BS at the bases bilateraly 7. Abdomen: Soft, non-tender, Non distended 8. Lower extremities: no clubbing, cyanosis, trace edema A Chin states that her right leg feels somewhat heavier and more swollen 9. 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 MSK: Normal range of motion  body mass index is 34.31 kg/(m^2).   Labs on Admission:   Results for orders placed or performed during the hospital encounter of 08/19/14 (from the past 24 hour(s))  Basic metabolic panel     Status: Abnormal   Collection Time: 08/19/14  2:54 PM  Result Value Ref Range   Sodium 139 135 - 145 mmol/L   Potassium 4.7 3.5 - 5.1 mmol/L   Chloride 105 101 - 111 mmol/L   CO2 26 22 - 32 mmol/L   Glucose, Bld 124  (H) 65 - 99 mg/dL   BUN 26 (H) 6 - 20 mg/dL   Creatinine, Ser 1.10 (H) 0.44 - 1.00 mg/dL   Calcium 9.3 8.9 - 10.3 mg/dL   GFR calc non Af Amer 44 (L) >60 mL/min   GFR calc Af Amer 52 (L) >60 mL/min   Anion gap 8 5 - 15  CBC     Status: Abnormal   Collection Time: 08/19/14  2:54 PM  Result Value Ref Range   WBC 8.4 4.0 - 10.5 K/uL   RBC 3.86 (L) 3.87 - 5.11 MIL/uL   Hemoglobin 11.0 (L) 12.0 - 15.0 g/dL   HCT 35.1 (L) 36.0 - 46.0 %   MCV 90.9 78.0 - 100.0 fL   MCH 28.5 26.0 - 34.0 pg   MCHC 31.3 30.0 - 36.0 g/dL   RDW 14.9 11.5 - 15.5 %   Platelets 171 150 - 400 K/uL  D-dimer, quantitative (not at [facility])     Status: Abnormal   Collection Time: 08/19/14  2:54 PM  Result Value Ref Range   D-Dimer, Quant 8.14 (H) 0.00 - 0.48 ug/mL-FEU  I-stat troponin, ED     Status: None   Collection Time: 08/19/14  3:10 PM  Result Value Ref Range   Troponin i, poc 0.01 0.00 - 0.08 ng/mL   Comment 3          Brain natriuretic peptide     Status: Abnormal   Collection Time: 08/19/14  3:23 PM  Result Value Ref Range   B Natriuretic Peptide 1627.6 (H) 0.0 - 100.0 pg/mL  CBG monitoring, ED     Status: Abnormal  Collection Time: 08/19/14  3:28 PM  Result Value Ref Range   Glucose-Capillary 109 (H) 65 - 99 mg/dL   Comment 1 Notify RN   Urinalysis, Routine w reflex microscopic (not at [facility])     Status: Abnormal   Collection Time: 08/19/14  5:47 PM  Result Value Ref Range   Color, Urine YELLOW YELLOW   APPearance CLOUDY (A) CLEAR   Specific Gravity, Urine 1.022 1.005 - 1.030   pH 5.0 5.0 - 8.0   Glucose, UA NEGATIVE NEGATIVE mg/dL   Hgb urine dipstick LARGE (A) NEGATIVE   Bilirubin Urine NEGATIVE NEGATIVE   Ketones, ur NEGATIVE NEGATIVE mg/dL   Protein, ur NEGATIVE NEGATIVE mg/dL   Urobilinogen, UA 0.2 0.0 - 1.0 mg/dL   Nitrite NEGATIVE NEGATIVE   Leukocytes, UA MODERATE (A) NEGATIVE  Urine microscopic-add on     Status: Abnormal   Collection Time: 08/19/14  5:47 PM  Result Value Ref Range     Squamous Epithelial / LPF FEW (A) RARE   WBC, UA 7-10 <3 WBC/hpf   RBC / HPF 0-2 <3 RBC/hpf   Bacteria, UA FEW (A) RARE   Urine-Other RARE YEAST     UA 7-10 white blood cells and few bacteria negative nitrates  Lab Results  Component Value Date   HGBA1C 6.4* 12/19/2012    Estimated Creatinine Clearance: 40.8 mL/min (by C-G formula based on Cr of 1.1).  BNP (last 3 results) No results for input(s): PROBNP in the last 8760 hours.  Other results:  I have pearsonaly reviewed this: ECG REPORT  Rate: 105  Rhythm: LBBB ST&T Change: NA QTC 504  Filed Weights   08/19/14 1701  Weight: 90.719 kg (200 lb)     Cultures:    Component Value Date/Time   SDES STOOL 08/11/2014 1840   SPECREQUEST NONE 08/11/2014 1840   CULT  02/19/2014 1329    Multiple bacterial morphotypes present, none predominant. Suggest appropriate recollection if clinically indicated. Performed at [facility] 08/13/2014 FINAL 08/11/2014 1840     Radiological Exams on Admission: Dg Chest 2 View  08/19/2014   CLINICAL DATA:  Shortness of breath and weakness  EXAM: CHEST  2 VIEW  COMPARISON:  August 16, 2014  FINDINGS: There is cardiomegaly with pulmonary vascular within normal limits. There is patchy consolidation in both lung bases with small pleural effusions. No adenopathy. There is postoperative change in the left breast region.  IMPRESSION: Suspect a degree of congestive heart failure. There may be superimposed pneumonia in the bases.   Electronically Signed   By: Lowella Grip III M.D.   On: 08/19/2014 15:49    Chart has been reviewed  Family  at  Bedside  plan of care was discussed with  Daughter,  Assessment/Plan  81 year old female with recent admission for cholecystectomy and diagnosis with Takotsubo cardiomyopathy with EF 20% hx of chronic peripheral neuropathy and leg weakness admitted for significant dyspnea initially thought tobe Possible CHF exacerbation vs HCAP CT  scan showed bilateral PE with evidence of mild CHF. Given hypotension PCCM recommended admission to stepdown.  Present on Admission:  New diagnosis of PE - heparin gtt, given soft BP will monitor in stepdown, hold off on coreg for tonight, very gentle PRN bolus, PCCM aware . Essential (primary) hypertension- patient states this never been a problem, on coreg for recent SVT possibly due to PE, will hold while BP soft . CKD (chronic kidney disease) stage 3, GFR 30-59 ml/min- patient denies history  of chronic kidney disease. In the past creatinine has been stable up until her admission in July. We'll monitor renal function  . Anemia of chronic disease - at baseline and continue to monitor  . HCAP (healthcare-associated pneumonia) - CT scan could not differentiate between infiltrate versus atelectasis. Patient continues to have some yellow sputum but  Afebrile for now continue broad spectrum antibiotics if no evidence of infection can DC. Given slightly dirty UA Urine culture sent Systolic heart failure - continue to hold off on Lasix given diagnosis of pulmonary embolism monitor carefully currently although dyspneic in no respiratory distress   Prophylaxis:  Heparin gtt  CODE STATUS:  FULL CODE as per family Disposition:   likely will need placement for rehabilitation will order PT OT eval                Other plan as per orders.  I have spent a total of 85 min on this admission extra time was spent to discuss care with radiology and PCCM as well as had an extensive discussion with family  Maurine Mowbray 08/19/2014, 6:53 PM  [facility]  Pager [phone]   after 2 AM please page floor coverage PA If 7AM-7PM, please contact the day team taking care of the patient  [email or web address]  Password [email or web address]

## 2014-08-19 NOTE — ED Notes (Signed)
Per ems pt from home, co gl weakness. Pt was discharged from hospital on Saturday after gallbladder removal, per ems per pt has been feeling weak since then. Also co nausea , no pain nor fever. Also co sob, and was dx with pneumonia while in hospital.

## 2014-08-19 NOTE — Progress Notes (Signed)
ANTIBIOTIC CONSULT NOTE - INITIAL  Pharmacy Consult for Vancomycin, Zosyn Indication: HCAP  Allergies  Allergen Reactions  . Cymbalta [Duloxetine Hcl] Other (See Comments)    Depression  . Lyrica [Pregabalin] Other (See Comments)    Blurry vision    Patient Measurements:   Height: 5'4" Weight (as of 08/17/14): 97.7 kg  Vital Signs: Temp: 97.8 F (36.6 C) (08/01 1433) Temp Source: Oral (08/01 1433) BP: 118/71 mmHg (08/01 1433) Pulse Rate: 105 (08/01 1433) Intake/Output from previous day:   Intake/Output from this shift:    Labs:  Recent Labs  08/17/14 0450 08/19/14 1454  WBC 7.9 8.4  HGB 10.3* 11.0*  PLT 160 171  CREATININE 1.33* 1.10*   Estimated Creatinine Clearance: 42.4 mL/min (by C-G formula based on Cr of 1.1). No results for input(s): VANCOTROUGH, VANCOPEAK, VANCORANDOM, GENTTROUGH, GENTPEAK, GENTRANDOM, TOBRATROUGH, TOBRAPEAK, TOBRARND, AMIKACINPEAK, AMIKACINTROU, AMIKACIN in the last 72 hours.   Microbiology: Recent Results (from the past 720 hour(s))  H.pylori Antigen, Stool     Status: None   Collection Time: 08/11/14  6:40 PM  Result Value Ref Range Status   Specimen Description STOOL  Final   Special Requests NONE  Final   H. pylori ag, stool NEGATIVE Performed at [facility]   Final   Report Status 08/13/2014 FINAL  Final  Surgical pcr screen     Status: None   Collection Time: 08/13/14  9:53 AM  Result Value Ref Range Status   MRSA, PCR NEGATIVE NEGATIVE Final   Staphylococcus aureus NEGATIVE NEGATIVE Final    Comment:        The Xpert SA Assay (FDA approved for NASAL specimens in patients over 27 years of age), is one component of a comprehensive surveillance program.  Test performance has been validated by [facility] for patients greater than or equal to 79 year old. It is not intended to diagnose infection nor to guide or monitor treatment.     Medical History: Past Medical History  Diagnosis Date  . Arthritis   .  Headache(784.0)   . Anxiety   . Depression   . Asthma   . CAP (community acquired pneumonia) 12/19/2012  . Intermittent LBBB (left bundle branch block) 12/21/2012  . Obesity   . Peripheral neuropathy   . Sacroiliitis, not elsewhere classified   . Osteopenia   . Hypercholesteremia   . Neuropathy   . Trigger finger     [name]  . Chest pain     NM stress test 05/2011 Normal  . DOE (dyspnea on exertion)     No SOB at rest  . Fatigue     excertional  . breast ca dx'd 2000    left  . Essential (primary) hypertension 04/02/2014  . Adult hypothyroidism 04/02/2014  . Hereditary and idiopathic peripheral neuropathy 07/02/2014  . Leg weakness   . CKD (chronic kidney disease) stage 3, GFR 30-59 ml/min 08/13/2014  . Anemia of chronic disease 08/13/2014     Assessment: 81 y/oF with PMH of CKD stage 3, hypothyroidism, HTN, peripheral neuropathy who was recently hospitalized for cholecystitis, cardiomyopathy, and HCAP and now presents to [facility] ED with worsening dyspnea and generalized weakness. CXR findings suggestive of heart failure vs. pneumonia. Pharmacy consulted to assist with dosing of Vancomycin and Zosyn for HCAP.  PTA Augmentin and Doxycycline 8/1 >> Vancomycin >> 8/1 >> Zosyn >>    8/1 blood x 2: sent  BNP elevated Afebrile WBC WNL SCr 1.1 with CrCl ~ 42 ml/min CG  Goal of Therapy:  Vancomycin trough level 15-20 mcg/ml  Appropriate antibiotic dosing for renal function; eradication of infection   Plan:   Vancomycin 750mg  IV q12h  Plan for Vancomycin trough level at steady state.  Zosyn 3.375g IV x 1 over 30 minutes, then Zosyn 3.375g IV q8h (infuse over 4 hours)  Monitor renal function, cultures, clinical course.   Lindell Spar, PharmD, BCPS Pager: [phone] 08/19/2014 4:42 PM

## 2014-08-19 NOTE — ED Notes (Signed)
19:25 to floor.

## 2014-08-19 NOTE — ED Notes (Signed)
Pt. Unable to use the restroom at this time, but is aware that we need a urine specimen.

## 2014-08-19 NOTE — ED Provider Notes (Signed)
CSN: [id number]     Arrival date & time 08/19/14  1415 History   First MD Initiated Contact with Patient 08/19/14 1501     Chief Complaint  Patient presents with  . Weakness     (Consider location/radiation/quality/duration/timing/severity/associated sxs/prior Treatment) Patient is a 81 y.o. female presenting with weakness and shortness of breath.  Weakness This is a new problem. The problem occurs constantly. The problem has been gradually worsening. Associated symptoms include chest pain (tightness, substernal, on and off, weeks -month) and shortness of breath. Pertinent negatives include no abdominal pain (mild post operative, improving) and no headaches.  Shortness of Breath Severity:  Severe Onset quality:  Gradual Duration:  1 week ((before GB surgery)) Timing:  Constant Progression:  Worsening Chronicity:  New Context comment:  Recent surgery Associated symptoms: chest pain (tightness, substernal, on and off, weeks -month), cough (yellow, thick) and sputum production   Associated symptoms: no abdominal pain (mild post operative, improving), no fever, no headaches, no hemoptysis, no neck pain, no rash, no sore throat, no syncope and no vomiting   Risk factors: hx of cancer (hx of past breast ca, lumpectomy), prolonged immobilization and recent surgery   Risk factors: no hx of PE/DVT and no tobacco use     Past Medical History  Diagnosis Date  . Arthritis   . Headache(784.0)   . Anxiety   . Depression   . Asthma   . CAP (community acquired pneumonia) 12/19/2012  . Intermittent LBBB (left bundle branch block) 12/21/2012  . Obesity   . Peripheral neuropathy   . Sacroiliitis, not elsewhere classified   . Osteopenia   . Hypercholesteremia   . Neuropathy   . Trigger finger     [name]  . Chest pain     NM stress test 05/2011 Normal  . DOE (dyspnea on exertion)     No SOB at rest  . Fatigue     excertional  . breast ca dx'd 2000    left  . Essential (primary)  hypertension 04/02/2014  . Adult hypothyroidism 04/02/2014  . Hereditary and idiopathic peripheral neuropathy 07/02/2014  . Leg weakness   . CKD (chronic kidney disease) stage 3, GFR 30-59 ml/min 08/13/2014  . Anemia of chronic disease 08/13/2014  . CHF (congestive heart failure) 08/19/2014   Past Surgical History  Procedure Laterality Date  . Leg surgery    . Breast lumpectomy      breast cancer  . Lumbar laminectomy    . Achilles tendon surgery    . Tonsillectomy    . Vesicovaginal fistula closure w/ tah      DC hysterectomy  . Polypectomy    . Cholecystectomy N/A 08/13/2014    Procedure: LAPAROSCOPIC CHOLECYSTECTOMY;  Surgeon: Stark Klein, MD;  Location: [facility];  Service: General;  Laterality: N/A;   Family History  Problem Relation Age of Onset  . Pneumonia Mother 72    cause of death  . Diabetes Brother   . Alzheimer's disease Sister   . CVA Daughter     01/21/09  . Pancreatic cancer Father   . Pneumonia Mother    History  Substance Use Topics  . Smoking status: Never Smoker   . Smokeless tobacco: Never Used  . Alcohol Use: No   OB History    No data available     Review of Systems  Constitutional: Positive for fatigue. Negative for fever.  HENT: Negative for sore throat.   Eyes: Negative for visual disturbance.  Respiratory: Positive  for cough (yellow, thick), sputum production and shortness of breath. Negative for hemoptysis.   Cardiovascular: Positive for chest pain (tightness, substernal, on and off, weeks -month). Negative for syncope.  Gastrointestinal: Negative for nausea, vomiting, abdominal pain (mild post operative, improving), diarrhea and blood in stool.  Genitourinary: Negative for difficulty urinating.  Musculoskeletal: Negative for back pain and neck pain.  Skin: Negative for rash.  Neurological: Positive for weakness (generalized). Negative for syncope and headaches.      Allergies  Cymbalta and Lyrica  Home Medications   Prior to Admission  medications   Medication Sig Start Date End Date Taking? Authorizing Provider  alendronate (FOSAMAX) 70 MG tablet Take 70 mg by mouth every Saturday. Take with a full glass of water on an empty stomach.   Yes Historical Provider, MD  ALPRAZolam (XANAX) 0.25 MG tablet TK 1 T PO TID PRN anxiety 07/19/14  Yes Historical Provider, MD  amoxicillin-clavulanate (AUGMENTIN) 875-125 MG per tablet Take 1 tablet by mouth 2 (two) times daily. 08/17/14  Yes Debbe Odea, MD  aspirin EC 81 MG tablet Take 81 mg by mouth daily.   Yes Historical Provider, MD  b complex vitamins tablet Take 1 tablet by mouth daily.   Yes Historical Provider, MD  Calcium-Vitamin D (CALTRATE 600 PLUS-VIT D PO) Take 1 tablet by mouth 2 (two) times daily.   Yes Historical Provider, MD  carvedilol (COREG) 6.25 MG tablet Take 1 tablet (6.25 mg total) by mouth 2 (two) times daily with a meal. 08/17/14  Yes Debbe Odea, MD  cholecalciferol (VITAMIN D) 1000 UNITS tablet Take 1,000 Units by mouth daily.   Yes Historical Provider, MD  doxycycline (VIBRA-TABS) 100 MG tablet Take 1 tablet (100 mg total) by mouth every 12 (twelve) hours. 08/17/14  Yes Debbe Odea, MD  furosemide (LASIX) 20 MG tablet Take 1 tablet (20 mg total) by mouth daily as needed for fluid or edema. 08/17/14  Yes Debbe Odea, MD  levothyroxine (SYNTHROID, LEVOTHROID) 25 MCG tablet Take 25 mcg by mouth daily before breakfast.   Yes Historical Provider, MD  Multiple Vitamin (MULITIVITAMIN WITH MINERALS) TABS Take 1 tablet by mouth daily.   Yes Historical Provider, MD  naproxen sodium (ANAPROX) 220 MG tablet Take 220 mg by mouth 2 (two) times daily as needed (for arthritis pain).    Yes Historical Provider, MD  nitroGLYCERIN (NITROSTAT) 0.4 MG SL tablet Place 0.4 mg under the tongue every 5 (five) minutes as needed for chest pain.   Yes Historical Provider, MD  omega-3 acid ethyl esters (LOVAZA) 1 G capsule Take 1 g by mouth daily.   Yes Historical Provider, MD   oxyCODONE-acetaminophen (PERCOCET/ROXICET) 5-325 MG per tablet Take 1 tablet by mouth every 6 (six) hours as needed for severe pain. 08/15/14  Yes Debbe Odea, MD  potassium chloride (K-DUR) 10 MEQ tablet Take 1 tablet (10 mEq total) by mouth daily as needed (to take with Lasix). 08/17/14  Yes Debbe Odea, MD  traMADol (ULTRAM) 50 MG tablet Take 1 tablet (50 mg total) by mouth every 6 (six) hours as needed for moderate pain. 04/01/14  Yes Dickie La, MD  VENTOLIN HFA 108 (90 BASE) MCG/ACT inhaler INhaLe 2 PufFS by mouth every 4 hours as needed for shortness of breath or wheezing 07/19/14  Yes Historical Provider, MD   BP 124/78 mmHg  Pulse 102  Temp(Src) 97.4 F (36.3 C) (Oral)  Resp 21  Ht 5\' 4"  (1.626 m)  Wt 210 lb 5.1 oz (95.4 kg)  BMI 36.08 kg/m2  SpO2 96% Physical Exam  Constitutional: She is oriented to person, place, and time. She appears well-developed and well-nourished. No distress.  HENT:  Head: Normocephalic and atraumatic.  Eyes: Conjunctivae and EOM are normal.  Neck: Normal range of motion.  Cardiovascular: Normal rate, regular rhythm, normal heart sounds and intact distal pulses.  Exam reveals no gallop and no friction rub.   No murmur heard. Pulmonary/Chest: Effort normal. No respiratory distress. She has no wheezes. She has rales (bibasilar).  Abdominal: Soft. She exhibits no distension. There is no tenderness. There is no guarding.  Musculoskeletal: She exhibits edema (2+ bilateral). She exhibits no tenderness.  Neurological: She is alert and oriented to person, place, and time.  Skin: Skin is warm and dry. No rash noted. She is not diaphoretic. No erythema.  Nursing note and vitals reviewed.   ED Course  Procedures (including critical care time) Labs Review Labs Reviewed  BASIC METABOLIC PANEL - Abnormal; Notable for the following:    Glucose, Bld 124 (*)    BUN 26 (*)    Creatinine, Ser 1.10 (*)    GFR calc non Af Amer 44 (*)    GFR calc Af Amer 52 (*)     All other components within normal limits  CBC - Abnormal; Notable for the following:    RBC 3.86 (*)    Hemoglobin 11.0 (*)    HCT 35.1 (*)    All other components within normal limits  URINALYSIS, ROUTINE W REFLEX MICROSCOPIC (NOT AT [facility]) - Abnormal; Notable for the following:    APPearance CLOUDY (*)    Hgb urine dipstick LARGE (*)    Leukocytes, UA MODERATE (*)    All other components within normal limits  D-DIMER, QUANTITATIVE (NOT AT [facility]) - Abnormal; Notable for the following:    D-Dimer, Quant 8.14 (*)    All other components within normal limits  BRAIN NATRIURETIC PEPTIDE - Abnormal; Notable for the following:    B Natriuretic Peptide 1627.6 (*)    All other components within normal limits  URINE MICROSCOPIC-ADD ON - Abnormal; Notable for the following:    Squamous Epithelial / LPF FEW (*)    Bacteria, UA FEW (*)    All other components within normal limits  HEPATIC FUNCTION PANEL - Abnormal; Notable for the following:    Albumin 3.4 (*)    All other components within normal limits  PROTIME-INR - Abnormal; Notable for the following:    Prothrombin Time 18.3 (*)    INR 1.51 (*)    All other components within normal limits  LACTIC ACID, PLASMA - Abnormal; Notable for the following:    Lactic Acid, Venous <0.3 (*)    All other components within normal limits  CBG MONITORING, ED - Abnormal; Notable for the following:    Glucose-Capillary 109 (*)    All other components within normal limits  CULTURE, BLOOD (ROUTINE X 2)  CULTURE, BLOOD (ROUTINE X 2)  CULTURE, EXPECTORATED SPUTUM-ASSESSMENT  GRAM STAIN  URINE CULTURE  APTT  PROCALCITONIN  TROPONIN I  CBC  HEPARIN LEVEL (UNFRACTIONATED)  LEGIONELLA ANTIGEN, URINE  STREP PNEUMONIAE URINARY ANTIGEN  COMPREHENSIVE METABOLIC PANEL  TROPONIN I  TROPONIN I  I-STAT TROPOININ, ED    Imaging Review Dg Chest 2 View  08/19/2014   CLINICAL DATA:  Shortness of breath and weakness  EXAM: CHEST  2 VIEW  COMPARISON:  August 16, 2014  FINDINGS: There is cardiomegaly with pulmonary vascular within normal limits. There  is patchy consolidation in both lung bases with small pleural effusions. No adenopathy. There is postoperative change in the left breast region.  IMPRESSION: Suspect a degree of congestive heart failure. There may be superimposed pneumonia in the bases.   Electronically Signed   By: Lowella Grip III M.D.   On: 08/19/2014 15:49   Ct Angio Chest Pe W/cm &/or Wo Cm  08/19/2014   CLINICAL DATA:  Progressive shortness of breath following a cholecystectomy 1 week ago.  EXAM: CT ANGIOGRAPHY CHEST WITH CONTRAST  TECHNIQUE: Multidetector CT imaging of the chest was performed using the standard protocol during bolus administration of intravenous contrast. Multiplanar CT image reconstructions and MIPs were obtained to evaluate the vascular anatomy.  CONTRAST:  63mL OMNIPAQUE IOHEXOL 350 MG/ML SOLN  COMPARISON:  Chest radiographs obtained earlier today. Chest CT dated 09/19/2003.  FINDINGS: Diffusely enlarged heart. Small to moderate-sized bilateral pleural effusions, larger on the right. Adjacent bilateral lower lobe atelectasis. Mildly prominent interstitial markings. Mild bullous changes.  Small number of small to moderate-sized pulmonary arterial filling defects bilaterally. The right ventricular 2 left ventricular ratio is difficult to determine due to lack of opacification of the left ventricle. The ratio is approximately 1.2.  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 Thoracic spine degenerative changes are noted. No lung nodules or enlarged lymph nodes.  Review of the MIP images confirms the above findings.  IMPRESSION: 1. Multiple small to moderate-sized bilateral pulmonary emboli. 2. CT evidence of right heart strain (RV/LV Ratio = 1.2) consistent with at least submassive (intermediate risk) PE. The presence of right heart strain has been associated with an increased risk of morbidity and mortality.  Please activate Code PE by paging [phone]. 3. Small to moderate-sized bilateral pleural effusions, larger on the right. 4. Bilateral lower lobe atelectasis. 5. Mild changes of COPD with mild superimposed interstitial pulmonary edema. Critical Value/emergent results were called by telephone at the time of interpretation on 08/19/2014 at 8:49 pm to Dr. Toy Baker , who verbally acknowledged these results.   Electronically Signed   By: Claudie Revering M.D.   On: 08/19/2014 20:58     EKG Interpretation   Date/Time:  Monday August 19 2014 15:15:22 EDT Ventricular Rate:  105 PR Interval:  168 QRS Duration: 148 QT Interval:  381 QTC Calculation: 504 R Axis:   14 Text Interpretation:  Sinus tachycardia Left bundle branch block No  significant change since last tracing Confirmed by [facility] MD, Flara Storti  ([id number]) on 08/20/2014 1:54:22 AM      MDM   Final diagnoses:  Dyspnea  Leg edema   81 year old female with a history of recent hospitalization for cholecystitis s/p cholecystectomy with associated pockets of his cardiomyopathy and EF of 20%, healthcare associated pneumonia with discharged on Saturday, with past medical history including chronic kidney disease, hypothyroidism, hypertension who presents with concern of continuing and worsening dyspnea and generalized weakness.  Differential diagnosis for shortness of breath includes worsening congestive heart failure, asthma, ACS, pneumonia, pulmonary embolus.  Exam, history and chest x-ray most consistent with heart failure versus pneumonia. Patient was given vancomycin and Zosyn.  BNP elevated at 1600, troponin negative. D-dimer done and positive, however patient with a GFR of 44 and discussed risks of doing a CTA and at time of my evaluation patient declines CT. Hospitalist called for admission for worsening dyspnea, likely CHF exacerbation versus pneumonia--however hospitalist discussed CTA more with patient and family who agreed with test and  CTA completed showing bilateral  PEs.  Pt admitted in stable condition.     Gareth Morgan, MD 08/20/14 [phone]

## 2014-08-19 NOTE — Progress Notes (Signed)
ANTICOAGULATION CONSULT NOTE - Initial Consult  Pharmacy Consult for Heparin Indication: pulmonary embolus  Allergies  Allergen Reactions  . Cymbalta [Duloxetine Hcl] Other (See Comments)    Depression  . Lyrica [Pregabalin] Other (See Comments)    Blurry vision    Patient Measurements: Height: 5\' 4"  (162.6 cm) Weight: 210 lb 5.1 oz (95.4 kg) IBW/kg (Calculated) : 54.7 Heparin Dosing Weight: 76.5 kg  Vital Signs: Temp: 97.4 F (36.3 C) (08/01 2109) Temp Source: Oral (08/01 2109) BP: 117/69 mmHg (08/01 2109) Pulse Rate: 103 (08/01 2109)  Labs:  Recent Labs  08/17/14 0450 08/19/14 1454  HGB 10.3* 11.0*  HCT 32.8* 35.1*  PLT 160 171  CREATININE 1.33* 1.10*    Estimated Creatinine Clearance: 41.9 mL/min (by C-G formula based on Cr of 1.1).   Medical History: Past Medical History  Diagnosis Date  . Arthritis   . Headache(784.0)   . Anxiety   . Depression   . Asthma   . CAP (community acquired pneumonia) 12/19/2012  . Intermittent LBBB (left bundle branch block) 12/21/2012  . Obesity   . Peripheral neuropathy   . Sacroiliitis, not elsewhere classified   . Osteopenia   . Hypercholesteremia   . Neuropathy   . Trigger finger     [name]  . Chest pain     NM stress test 05/2011 Normal  . DOE (dyspnea on exertion)     No SOB at rest  . Fatigue     excertional  . breast ca dx'd 2000    left  . Essential (primary) hypertension 04/02/2014  . Adult hypothyroidism 04/02/2014  . Hereditary and idiopathic peripheral neuropathy 07/02/2014  . Leg weakness   . CKD (chronic kidney disease) stage 3, GFR 30-59 ml/min 08/13/2014  . Anemia of chronic disease 08/13/2014  . CHF (congestive heart failure) 08/19/2014     Assessment: 69 y/oF with PMH of CKD stage 3, hypothyroidism, HTN, peripheral neuropathy who was recently hospitalized for cholecystitis, cardiomyopathy, and HCAP and now presents to [facility] ED with worsening dyspnea and generalized weakness. CXR findings  suggestive of heart failure vs. pneumonia.  CT angio of chest positive for multiple small to moderate-sized bilateral pulmonary emboli. Pharmacy consulted to assist with dosing of heparin infusion for this patient.    No anticoagulants PTA  Baseline coags: PT/INR 18.3/1.51 (slightly elevated), aPTT 28  LFTs, Tbili WNL  CBC: Hgb 11 (stable compared to previous values), Pltc WNL  SCr 1.1 with CrCl ~ 42 ml/min   Goal of Therapy:  Heparin level 0.3-0.7 units/ml Monitor platelets by anticoagulation protocol: Yes   Plan:   D/C prophylactic dose lovenox.  Give 4000 units heparin bolus x 1.  Start heparin infusion at 1250 units/hr.  Check heparin level 8 hours after heparin infusion initiated.   Daily heparin level and CBC.  Monitor closely for s/s of bleeding.   Lindell Spar, PharmD, BCPS Pager: [phone] 08/19/2014 10:11 PM

## 2014-08-20 ENCOUNTER — Ambulatory Visit (HOSPITAL_COMMUNITY): Payer: Commercial Managed Care - HMO

## 2014-08-20 ENCOUNTER — Inpatient Hospital Stay (HOSPITAL_COMMUNITY): Payer: Commercial Managed Care - HMO

## 2014-08-20 DIAGNOSIS — N183 Chronic kidney disease, stage 3 (moderate): Secondary | ICD-10-CM

## 2014-08-20 DIAGNOSIS — J189 Pneumonia, unspecified organism: Secondary | ICD-10-CM

## 2014-08-20 DIAGNOSIS — I2699 Other pulmonary embolism without acute cor pulmonale: Secondary | ICD-10-CM

## 2014-08-20 DIAGNOSIS — I5021 Acute systolic (congestive) heart failure: Secondary | ICD-10-CM

## 2014-08-20 DIAGNOSIS — R600 Localized edema: Secondary | ICD-10-CM

## 2014-08-20 DIAGNOSIS — I10 Essential (primary) hypertension: Secondary | ICD-10-CM

## 2014-08-20 LAB — COMPREHENSIVE METABOLIC PANEL
ALBUMIN: 3.1 g/dL — AB (ref 3.5–5.0)
ALT: 18 U/L (ref 14–54)
ANION GAP: 6 (ref 5–15)
AST: 22 U/L (ref 15–41)
Alkaline Phosphatase: 36 U/L — ABNORMAL LOW (ref 38–126)
BILIRUBIN TOTAL: 0.8 mg/dL (ref 0.3–1.2)
BUN: 24 mg/dL — AB (ref 6–20)
CO2: 26 mmol/L (ref 22–32)
Calcium: 8.2 mg/dL — ABNORMAL LOW (ref 8.9–10.3)
Chloride: 105 mmol/L (ref 101–111)
Creatinine, Ser: 1.16 mg/dL — ABNORMAL HIGH (ref 0.44–1.00)
GFR calc Af Amer: 48 mL/min — ABNORMAL LOW (ref >60)
GFR, EST NON AFRICAN AMERICAN: 42 mL/min — AB (ref >60)
GLUCOSE: 103 mg/dL — AB (ref 65–99)
Potassium: 4.2 mmol/L (ref 3.5–5.1)
SODIUM: 137 mmol/L (ref 135–145)
Total Protein: 6 g/dL — ABNORMAL LOW (ref 6.5–8.1)

## 2014-08-20 LAB — PROCALCITONIN: Procalcitonin: <0.1 ng/mL

## 2014-08-20 LAB — TROPONIN I
Troponin I: 0.03 ng/mL (ref <0.031)
Troponin I: 0.03 ng/mL (ref <0.031)
Troponin I: 0.03 ng/mL (ref <0.031)

## 2014-08-20 LAB — MAGNESIUM: MAGNESIUM: 1.4 mg/dL — AB (ref 1.7–2.4)

## 2014-08-20 LAB — HEPARIN LEVEL (UNFRACTIONATED)
Heparin Unfractionated: 0.9 IU/mL — ABNORMAL HIGH (ref 0.30–0.70)
Heparin Unfractionated: 0.53 IU/mL (ref 0.30–0.70)
Heparin Unfractionated: 0.57 IU/mL (ref 0.30–0.70)

## 2014-08-20 LAB — STREP PNEUMONIAE URINARY ANTIGEN: Strep Pneumo Urinary Antigen: NEGATIVE

## 2014-08-20 LAB — CBC
HCT: 32.1 % — ABNORMAL LOW (ref 36.0–46.0)
Hemoglobin: 10.3 g/dL — ABNORMAL LOW (ref 12.0–15.0)
MCH: 29.2 pg (ref 26.0–34.0)
MCHC: 32.1 g/dL (ref 30.0–36.0)
MCV: 90.9 fL (ref 78.0–100.0)
Platelets: 156 10*3/uL (ref 150–400)
RBC: 3.53 MIL/uL — AB (ref 3.87–5.11)
RDW: 14.9 % (ref 11.5–15.5)
WBC: 8.4 10*3/uL (ref 4.0–10.5)

## 2014-08-20 LAB — TSH: TSH: 2.534 u[IU]/mL (ref 0.350–4.500)

## 2014-08-20 LAB — PHOSPHORUS: Phosphorus: 4.5 mg/dL (ref 2.5–4.6)

## 2014-08-20 MED ORDER — PERFLUTREN LIPID MICROSPHERE
1.0000 mL | INTRAVENOUS | Status: AC | PRN
  Administered 2014-08-20: 2 mL via INTRAVENOUS
  Filled 2014-08-20: qty 10

## 2014-08-20 MED ORDER — PERFLUTREN LIPID MICROSPHERE
INTRAVENOUS | Status: AC
  Filled 2014-08-20: qty 10

## 2014-08-20 MED ORDER — HEPARIN (PORCINE) IN NACL 100-0.45 UNIT/ML-% IJ SOLN
1000.0000 [IU]/h | INTRAMUSCULAR | Status: AC
  Administered 2014-08-20 – 2014-08-21 (×2): 1000 [IU]/h via INTRAVENOUS
  Filled 2014-08-20 (×4): qty 250

## 2014-08-20 MED ORDER — FUROSEMIDE 10 MG/ML IJ SOLN
20.0000 mg | Freq: Two times a day (BID) | INTRAMUSCULAR | Status: DC
  Administered 2014-08-20 – 2014-08-21 (×4): 20 mg via INTRAVENOUS
  Filled 2014-08-20 (×6): qty 2

## 2014-08-20 NOTE — Progress Notes (Signed)
[facility] PROGRESS NOTE Interim History: 81 y.o. 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 Last echogram showed EF of 20%. Hospital stay was complicated by SVT for which her Coreg dose has been adjusted. The day of the discharge she was found to have a new right base infiltrate discharge her on by mouth antibiotics   Assessment/Plan: Acute  Pulmonary embolism - CT angio of chest showed new PE. Started on heparin gtt. - Sats remained greater than 90% on 2 L. - will have to d/w pt NOAC's vs coumadin. - will need at least 3-6 months.  Acute chronic systolic heart failure due to takatsubo syndrome: - Seems to be fluid overload +JVD with crackles in her right lung base. - strict I and O's, start lasix. - Limit her fluid intake.  HCAP (healthcare-associated pneumonia) - Start Vanc and zosyn, has remained afebrile.  CKD (chronic kidney disease) stage 3, GFR 30-59 ml/min  Anemia of chronic disease - Mild drop in hbg compare to previous. - monitor.  Essential (primary) hypertension - controlled.   Code Status: full Family Communication: daughter  Disposition Plan: inpatient   Consultants:  none  Procedures:  CT angio  Antibiotics:  vanc and [id number]  HPI/Subjective: She relates she still feels SOB  Objective: Filed Vitals:   08/20/14 0200 08/20/14 0400 08/20/14 0500 08/20/14 0600  BP: 87/46 93/53  113/70  Pulse: 66 66  95  Temp:  97 F (36.1 C)    TempSrc:      Resp: 14 14  14   Height:      Weight:   95.2 kg (209 lb 14.1 oz)   SpO2: 100% 100%  100%    Intake/Output Summary (Last 24 hours) at 08/20/14 0716 Last data filed at 08/20/14 0600  Gross per 24 hour  Intake 799.79 ml  Output   1250 ml  Net -450.21 ml   Filed Weights   08/19/14 1701 08/19/14 2109 08/20/14 0500  Weight: 90.719 kg (200 lb) 95.4 kg (210  lb 5.1 oz) 95.2 kg (209 lb 14.1 oz)    Exam:  General: Alert, awake, oriented x3, in no acute distress.  HEENT: No bruits, no goiter. +JVD Heart: Regular rate and rhythm, 2+ lower ext edema Lungs: Good air movement, clear Abdomen: Soft, nontender, nondistended, positive bowel sounds.  Neuro: Grossly intact, nonfocal.   Data Reviewed: Basic Metabolic Panel:  Recent Labs Lab 08/15/14 0500 08/16/14 0429 08/16/14 0855 08/17/14 0450 08/19/14 1454 08/20/14 0623  NA 139 140  --  143 139 137  K 4.5 4.4  --  4.0 4.7 4.2  CL 109 109  --  109 105 105  CO2 22 25  --  26 26 26   GLUCOSE 111* 101*  --  115* 124* 103*  BUN 39* 38*  --  34* 26* 24*  CREATININE 1.45* 1.32*  --  1.33* 1.10* 1.16*  CALCIUM 8.6* 8.8*  --  8.9 9.3 8.2*  MG  --   --  1.8  --   --  1.4*  PHOS  --   --   --   --   --  4.5   Liver Function Tests:  Recent Labs Lab 08/15/14 0500 08/16/14 0429 08/17/14 0450 08/19/14 2120 08/20/14 0623  AST 38 27 25 24 22   ALT 30 25 22 20 18   ALKPHOS 42 41 43 39 36*  BILITOT 0.6 0.5 0.6 0.8 0.8  PROT 6.8 6.6 6.4* 6.5 6.0*  ALBUMIN 3.7 3.5 3.4* 3.4* 3.1*   No results for input(s): LIPASE, AMYLASE in the last 168 hours. No results for input(s): AMMONIA in the last 168 hours. CBC:  Recent Labs Lab 08/15/14 0500 08/16/14 0429 08/17/14 0450 08/19/14 1454 08/20/14 0623  WBC 9.0 8.5 7.9 8.4 8.4  HGB 10.2* 10.5* 10.3* 11.0* 10.3*  HCT 33.1* 33.8* 32.8* 35.1* 32.1*  MCV 94.3 94.4 93.2 90.9 90.9  PLT 158 152 160 171 156   Cardiac Enzymes:  Recent Labs Lab 08/13/14 1920 08/14/14 0003 08/14/14 0558 08/19/14 2301 08/20/14 0623  TROPONINI <0.03 0.03 0.03 0.03 0.03   BNP (last 3 results)  Recent Labs  08/16/14 0855 08/19/14 1523  BNP 1169.9* 1627.6*    ProBNP (last 3 results) No results for input(s): PROBNP in the last 8760 hours.  CBG:  Recent Labs Lab 08/19/14 1528  GLUCAP 109*    Recent Results (from the past 240 hour(s))  H.pylori Antigen,  Stool     Status: None   Collection Time: 08/11/14  6:40 PM  Result Value Ref Range Status   Specimen Description STOOL  Final   Special Requests NONE  Final   H. pylori ag, stool NEGATIVE Performed at [facility]   Final   Report Status 08/13/2014 FINAL  Final  Surgical pcr screen     Status: None   Collection Time: 08/13/14  9:53 AM  Result Value Ref Range Status   MRSA, PCR NEGATIVE NEGATIVE Final   Staphylococcus aureus NEGATIVE NEGATIVE Final    Comment:        The Xpert SA Assay (FDA approved for NASAL specimens in patients over 23 years of age), is one component of a comprehensive surveillance program.  Test performance has been validated by [facility] for patients greater than or equal to 62 year old. It is not intended to diagnose infection nor to guide or monitor treatment.   Blood culture (routine x 2)     Status: None (Preliminary result)   Collection Time: 08/19/14  3:50 PM  Result Value Ref Range Status   Specimen Description   Final    BLOOD RIGHT HAND Performed at [facility]    Special Requests BOTTLES DRAWN AEROBIC AND ANAEROBIC 3ML  Final   Culture PENDING  Incomplete   Report Status PENDING  Incomplete     Studies: Dg Chest 2 View  08/19/2014   CLINICAL DATA:  Shortness of breath and weakness  EXAM: CHEST  2 VIEW  COMPARISON:  August 16, 2014  FINDINGS: There is cardiomegaly with pulmonary vascular within normal limits. There is patchy consolidation in both lung bases with small pleural effusions. No adenopathy. There is postoperative change in the left breast region.  IMPRESSION: Suspect a degree of congestive heart failure. There may be superimposed pneumonia in the bases.   Electronically Signed   By: Lowella Grip III M.D.   On: 08/19/2014 15:49   Ct Angio Chest Pe W/cm &/or Wo Cm  08/19/2014   CLINICAL DATA:  Progressive shortness of breath following a cholecystectomy 1 week ago.  EXAM: CT ANGIOGRAPHY CHEST WITH CONTRAST   TECHNIQUE: Multidetector CT imaging of the chest was performed using the standard protocol during bolus administration of intravenous contrast. Multiplanar CT image reconstructions and MIPs were obtained to evaluate the vascular anatomy.  CONTRAST:  68mL OMNIPAQUE IOHEXOL 350 MG/ML SOLN  COMPARISON:  Chest radiographs obtained earlier today. Chest CT dated 09/19/2003.  FINDINGS: Diffusely  enlarged heart. Small to moderate-sized bilateral pleural effusions, larger on the right. Adjacent bilateral lower lobe atelectasis. Mildly prominent interstitial markings. Mild bullous changes.  Small number of small to moderate-sized pulmonary arterial filling defects bilaterally. The right ventricular 2 left ventricular ratio is difficult to determine due to lack of opacification of the left ventricle. The ratio is approximately 1.2.  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 Thoracic spine degenerative changes are noted. No lung nodules or enlarged lymph nodes.  Review of the MIP images confirms the above findings.  IMPRESSION: 1. Multiple small to moderate-sized bilateral pulmonary emboli. 2. CT evidence of right heart strain (RV/LV Ratio = 1.2) consistent with at least submassive (intermediate risk) PE. The presence of right heart strain has been associated with an increased risk of morbidity and mortality. Please activate Code PE by paging [phone]. 3. Small to moderate-sized bilateral pleural effusions, larger on the right. 4. Bilateral lower lobe atelectasis. 5. Mild changes of COPD with mild superimposed interstitial pulmonary edema. Critical Value/emergent results were called by telephone at the time of interpretation on 08/19/2014 at 8:49 pm to Dr. Toy Baker , who verbally acknowledged these results.   Electronically Signed   By: Claudie Revering M.D.   On: 08/19/2014 20:58    Scheduled Meds: . aspirin EC  81 mg Oral Daily  . levothyroxine  25 mcg Oral QAC breakfast  .  piperacillin-tazobactam (ZOSYN)  IV  3.375 g Intravenous 3 times per day  . vancomycin  750 mg Intravenous Q12H   Continuous Infusions: . heparin 1,000 Units/hr (08/20/14 0706)    Time Spent: 35 min   Alexandra [name]  [facility] Pager [phone]. If 7PM-7AM, please contact night-coverage at [email or web address], password [facility] 08/20/2014, 7:16 AM  LOS: 1 day

## 2014-08-20 NOTE — Progress Notes (Addendum)
Bilateral lower extremity venous duplex completed.  Right:  DVT noted distal femoral and popliteal veins.  No evidence of superficial thrombosis.  No Baker's cyst.  Left: DVT noted gastrocnemius vein.  There appears to be superficial thrombosis in the lesser saphenous vein.  No Baker's cyst.

## 2014-08-20 NOTE — Progress Notes (Addendum)
ANTICOAGULATION CONSULT NOTE - F/u Consult  Pharmacy Consult for Heparin Indication: pulmonary embolus  Allergies  Allergen Reactions  . Cymbalta [Duloxetine Hcl] Other (See Comments)    Depression  . Lyrica [Pregabalin] Other (See Comments)    Blurry vision    Patient Measurements: Height: 5\' 4"  (162.6 cm) Weight: 209 lb 14.1 oz (95.2 kg) IBW/kg (Calculated) : 54.7 Heparin Dosing Weight: 76.5 kg  Vital Signs: Temp: 97 F (36.1 C) (08/02 0400) Temp Source: Oral (08/01 2109) BP: 113/70 mmHg (08/02 0600) Pulse Rate: 95 (08/02 0600)  Labs:  Recent Labs  08/19/14 1454 08/19/14 2120 08/19/14 2301 08/20/14 0623  HGB 11.0*  --   --  10.3*  HCT 35.1*  --   --  32.1*  PLT 171  --   --  156  APTT  --  28  --   --   LABPROT  --  18.3*  --   --   INR  --  1.51*  --   --   HEPARINUNFRC  --   --   --  0.90*  CREATININE 1.10*  --   --  1.16*  TROPONINI  --   --  0.03  --     Estimated Creatinine Clearance: 39.7 mL/min (by C-G formula based on Cr of 1.16).   Medical History: Past Medical History  Diagnosis Date  . Arthritis   . Headache(784.0)   . Anxiety   . Depression   . Asthma   . CAP (community acquired pneumonia) 12/19/2012  . Intermittent LBBB (left bundle branch block) 12/21/2012  . Obesity   . Peripheral neuropathy   . Sacroiliitis, not elsewhere classified   . Osteopenia   . Hypercholesteremia   . Neuropathy   . Trigger finger     [name]  . Chest pain     NM stress test 05/2011 Normal  . DOE (dyspnea on exertion)     No SOB at rest  . Fatigue     excertional  . breast ca dx'd 2000    left  . Essential (primary) hypertension 04/02/2014  . Adult hypothyroidism 04/02/2014  . Hereditary and idiopathic peripheral neuropathy 07/02/2014  . Leg weakness   . CKD (chronic kidney disease) stage 3, GFR 30-59 ml/min 08/13/2014  . Anemia of chronic disease 08/13/2014  . CHF (congestive heart failure) 08/19/2014     Assessment: 15 y/oF with PMH of CKD stage  3, hypothyroidism, HTN, peripheral neuropathy who was recently hospitalized for cholecystitis, cardiomyopathy, and HCAP and now presents to [facility] ED with worsening dyspnea and generalized weakness. CXR findings suggestive of heart failure vs. pneumonia.  CT angio of chest positive for multiple small to moderate-sized bilateral pulmonary emboli. Pharmacy consulted to assist with dosing of heparin infusion for this patient.    No anticoagulants PTA  Baseline coags: PT/INR 18.3/1.51 (slightly elevated), aPTT 28  LFTs, Tbili WNL  CBC: Hgb 11 (stable compared to previous values), Pltc WNL  SCr 1.1 with CrCl ~ 42 ml/min Today, 08/20/14  0630 HL=0.90, no problems per RN   Goal of Therapy:  Heparin level 0.3-0.7 units/ml Monitor platelets by anticoagulation protocol: Yes   Plan: .  Decrease heparin drip to 1000 units/hr  Check heparin level 8 hours   Daily heparin level and CBC.  Monitor closely for s/s of bleeding.    Dorrene German 08/19/2014 10:11 PM

## 2014-08-20 NOTE — Care Management Note (Signed)
Case Management Note  Patient Details  Name: Alexandra [name] MRN: [id number] Date of Birth: June 28, 1927  Subjective/Objective:                 P.E. And cardiomyopathy  EF of 20%   Action/Plan:  Date:  August 20, 2014 U.R. performed for needs and level of care. Will continue to follow for Case Management needs.  Velva Harman, RN, BSN, [place]   [phone]  Expected Discharge Date:  08/23/14               Expected Discharge Plan:  Home/Self Care  In-House Referral:  NA  Discharge planning Services  CM Consult  Post Acute Care Choice:  NA Choice offered to:  NA  DME Arranged:  N/A DME Agency:  NA  HH Arranged:  NA HH Agency:  NA  Status of Service:  In process, will continue to follow  Medicare Important Message Given:    Date Medicare IM Given:    Medicare IM give by:    Date Additional Medicare IM Given:    Additional Medicare Important Message give by:     If discussed at Opp of Stay Meetings, dates discussed:    Additional Comments:  Leeroy Cha, RN 08/20/2014, 10:31 AM

## 2014-08-20 NOTE — Progress Notes (Signed)
ANTICOAGULATION CONSULT NOTE - Follow Up Consult  Pharmacy Consult for Heparin Indication: pulmonary embolus  Allergies  Allergen Reactions  . Cymbalta [Duloxetine Hcl] Other (See Comments)    Depression  . Lyrica [Pregabalin] Other (See Comments)    Blurry vision    Patient Measurements: Height: 5\' 4"  (162.6 cm) Weight: 209 lb 14.1 oz (95.2 kg) IBW/kg (Calculated) : 54.7 Heparin Dosing Weight: 76.5 kg  Vital Signs: Temp: 98 F (36.7 C) (08/02 1600) Temp Source: Oral (08/02 1600) BP: 94/52 mmHg (08/02 1400) Pulse Rate: 78 (08/02 1400)  Labs:  Recent Labs  08/19/14 1454 08/19/14 2120 08/19/14 2301 08/20/14 0623 08/20/14 1225 08/20/14 1535  HGB 11.0*  --   --  10.3*  --   --   HCT 35.1*  --   --  32.1*  --   --   PLT 171  --   --  156  --   --   APTT  --  28  --   --   --   --   LABPROT  --  18.3*  --   --   --   --   INR  --  1.51*  --   --   --   --   HEPARINUNFRC  --   --   --  0.90*  --  0.53  CREATININE 1.10*  --   --  1.16*  --   --   TROPONINI  --   --  0.03 0.03 0.03  --     Estimated Creatinine Clearance: 39.7 mL/min (by C-G formula based on Cr of 1.16).  Assessment: 43 y/oF with PMH of CKD stage 3, hypothyroidism, HTN, peripheral neuropathy who was recently hospitalized for cholecystitis, cardiomyopathy, and HCAP and now presents to [facility] ED with worsening dyspnea and generalized weakness. CXR findings suggestive of heart failure vs. pneumonia.  CT angio of chest positive for multiple small to moderate-sized bilateral pulmonary emboli. Pharmacy consulted to assist with dosing of heparin infusion for this patient.   No anticoagulants PTA  Baseline coags: PT/INR 18.3/1.51 (slightly elevated), aPTT 28  LFTs, Tbili WNL  CBC: Hgb 11 (stable compared to previous values), Pltc WNL  SCr 1.1 with CrCl ~ 42 ml/min  Today, 08/20/14  HL=0.53 (therapeutic), on 1000 units/hr  No bleeding/complications reported  Hgb stable, Plts slightly decreased  Goal of  Therapy:  Heparin level 0.3-0.7 units/ml Monitor platelets by anticoagulation protocol: Yes   Plan: .  Continue heparin infusion at 1000 units/hr  Recheck heparin in 8 hours to verify therapeutic   Daily heparin level and CBC.  Monitor closely for s/s of bleeding.  Alexandra [name]  Pager: [phone] 08/20/2014 4:34 PM

## 2014-08-20 NOTE — Plan of Care (Signed)
Problem: ICU Phase Progression Outcomes Goal: Pain controlled with appropriate interventions Outcome: Not Applicable Date Met:  49/49/44 Denies pain.

## 2014-08-20 NOTE — Progress Notes (Signed)
Echocardiogram 2D Echocardiogram with Definity has been performed.  Tresa Res 08/20/2014, 2:53 PM

## 2014-08-20 NOTE — Progress Notes (Signed)
Ordered sputum specimens have not been sent because the patient has been unable to produce any sputum.

## 2014-08-20 NOTE — Progress Notes (Signed)
The patient and her daughter at the bedside, refused urinary catheter insertion for the patient as ordered by MD.

## 2014-08-21 ENCOUNTER — Ambulatory Visit: Payer: Medicare HMO

## 2014-08-21 DIAGNOSIS — I5043 Acute on chronic combined systolic (congestive) and diastolic (congestive) heart failure: Secondary | ICD-10-CM

## 2014-08-21 LAB — BASIC METABOLIC PANEL
ANION GAP: 8 (ref 5–15)
BUN: 22 mg/dL — AB (ref 6–20)
CO2: 26 mmol/L (ref 22–32)
Calcium: 8.6 mg/dL — ABNORMAL LOW (ref 8.9–10.3)
Chloride: 104 mmol/L (ref 101–111)
Creatinine, Ser: 1.33 mg/dL — ABNORMAL HIGH (ref 0.44–1.00)
GFR, EST AFRICAN AMERICAN: 41 mL/min — AB (ref >60)
GFR, EST NON AFRICAN AMERICAN: 35 mL/min — AB (ref >60)
Glucose, Bld: 92 mg/dL (ref 65–99)
Potassium: 4.4 mmol/L (ref 3.5–5.1)
SODIUM: 138 mmol/L (ref 135–145)

## 2014-08-21 LAB — CBC
HEMATOCRIT: 31.8 % — AB (ref 36.0–46.0)
Hemoglobin: 10.2 g/dL — ABNORMAL LOW (ref 12.0–15.0)
MCH: 29.2 pg (ref 26.0–34.0)
MCHC: 32.1 g/dL (ref 30.0–36.0)
MCV: 91.1 fL (ref 78.0–100.0)
Platelets: 159 10*3/uL (ref 150–400)
RBC: 3.49 MIL/uL — AB (ref 3.87–5.11)
RDW: 14.9 % (ref 11.5–15.5)
WBC: 7.6 10*3/uL (ref 4.0–10.5)

## 2014-08-21 LAB — HEPARIN LEVEL (UNFRACTIONATED): Heparin Unfractionated: 0.57 IU/mL (ref 0.30–0.70)

## 2014-08-21 LAB — PROCALCITONIN: Procalcitonin: <0.1 ng/mL

## 2014-08-21 LAB — LEGIONELLA ANTIGEN, URINE

## 2014-08-21 LAB — VANCOMYCIN, TROUGH: VANCOMYCIN TR: 18 ug/mL (ref 10.0–20.0)

## 2014-08-21 NOTE — Progress Notes (Signed)
ANTIBIOTIC CONSULT NOTE - Follow up  Pharmacy Consult for Vancomycin, Zosyn Indication: HCAP  Allergies  Allergen Reactions  . Cymbalta [Duloxetine Hcl] Other (See Comments)    Depression  . Lyrica [Pregabalin] Other (See Comments)    Blurry vision    Patient Measurements: Height: 5\' 4"  (162.6 cm) Weight: 202 lb 9.6 oz (91.9 kg) IBW/kg (Calculated) : 54.7  Vital Signs: Temp: 98.6 F (37 C) (08/03 0400) Temp Source: Oral (08/03 0400) BP: 100/52 mmHg (08/03 0800) Pulse Rate: 100 (08/03 0800) Intake/Output from previous day: 08/02 0701 - 08/03 0700 In: 810.3 [P.O.:120; I.V.:240.3; IV Piggyback:450] Out: [id number] [Urine:4225]  Labs:  Recent Labs  08/19/14 1454 08/20/14 0623 08/21/14 0508 08/21/14 0816  WBC 8.4 8.4 7.6  --   HGB 11.0* 10.3* 10.2*  --   PLT 171 156 159  --   CREATININE 1.10* 1.16*  --  1.33*   Estimated Creatinine Clearance: 34 mL/min (by C-G formula based on Cr of 1.33). No results for input(s): VANCOTROUGH, VANCOPEAK, VANCORANDOM, GENTTROUGH, GENTPEAK, GENTRANDOM, TOBRATROUGH, TOBRAPEAK, TOBRARND, AMIKACINPEAK, AMIKACINTROU, AMIKACIN in the last 72 hours.   Assessment: 39 y/oF with PMH of CKD stage 3, hypothyroidism, HTN, peripheral neuropathy who was recently hospitalized for cholecystitis, cardiomyopathy, and HCAP and now presents to [facility] ED with worsening dyspnea and generalized weakness. CXR findings suggestive of heart failure vs. pneumonia. Pharmacy consulted to dose Vancomycin and Zosyn for HCAP.  PTA Augmentin and Doxycycline 8/1 >> Vancomycin >> 8/1 >> Zosyn >>    Micro: 8/1 blood x 2: ngtd 8/1 urine: pending 8/1 Strep pneumo Ur Ag: neg 8/1 Legionella Ur Ag:   Today, 08/21/2014:  Afebrile  WBC WNL  SCr 1.33 with CrCl ~ 34 ml/min (~ 35 N)  Goal of Therapy:  Vancomycin trough level 15-20 mcg/ml  Appropriate antibiotic dosing for renal function; eradication of infection   Plan:   Continue Zosyn 3.375g IV Q8H infused over 4hrs.    Continue Vancomycin 750mg  IV q12h Measure Vanc trough at steady state - tonight prior to 1700 dose. Follow up renal fxn, culture results, and clinical course.   Gretta Arab PharmD, BCPS Pager [phone] 08/21/2014 9:21 AM

## 2014-08-21 NOTE — Progress Notes (Signed)
[facility] PROGRESS NOTE Interim History: 81 y.o. 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 Last echogram showed EF of 20%. Hospital stay was complicated by SVT for which her Coreg dose has been adjusted. The day of the discharge she was found to have a new right base infiltrate discharge her on by mouth antibiotics   Assessment/Plan: Acute  Pulmonary embolism - CT angio of chest showed new PE, lower et DVT positive.  - cont IV heparin. - will need at least 3-6 months. - sating greater than 90 %  Acute chronic systolic heart failure due to takatsubo syndrome: - Cont to be overloaded. - strict I and O's, cont lasix. - Limit her fluid intake. - pt consult. - will cont to hold beta blockers, as she is acute decompensated.  HCAP (healthcare-associated pneumonia) - Cont IV vanc and zosyn.  CKD (chronic kidney disease) stage 3, GFR 30-59 ml/min - Cr at baseline.  Anemia of chronic disease - Mild drop in hbg compare to previous. - monitor.  Essential (primary) hypertension - controlled.   Code Status: full Family Communication: daughter  Disposition Plan: inpatient   Consultants:  none  Procedures:  CT angio  Antibiotics:  vanc and [id number]  HPI/Subjective: She relates her SOB is improved.  Objective: Filed Vitals:   08/21/14 0300 08/21/14 0400 08/21/14 0500 08/21/14 0600  BP:  93/48  105/52  Pulse: 97 88 96 95  Temp:  98.6 F (37 C)    TempSrc:  Oral    Resp: 17 23 19 16   Height:      Weight:   91.9 kg (202 lb 9.6 oz)   SpO2: 96% 100% 98% 96%    Intake/Output Summary (Last 24 hours) at 08/21/14 0717 Last data filed at 08/21/14 0600  Gross per 24 hour  Intake 800.25 ml  Output   4225 ml  Net -3424.75 ml   Filed Weights   08/19/14 2109 08/20/14 0500 08/21/14 0500  Weight: 95.4 kg (210 lb 5.1 oz) 95.2 kg  (209 lb 14.1 oz) 91.9 kg (202 lb 9.6 oz)    Exam:  General: Alert, awake, oriented x3, in no acute distress.  HEENT: No bruits, no goiter. +JVD Heart: Regular rate and rhythm, 2+ lower ext edema Lungs: Good air movement, clear Abdomen: Soft, nontender, nondistended, positive bowel sounds.  Neuro: Grossly intact, nonfocal.   Data Reviewed: Basic Metabolic Panel:  Recent Labs Lab 08/15/14 0500 08/16/14 0429 08/16/14 0855 08/17/14 0450 08/19/14 1454 08/20/14 0623  NA 139 140  --  143 139 137  K 4.5 4.4  --  4.0 4.7 4.2  CL 109 109  --  109 105 105  CO2 22 25  --  26 26 26   GLUCOSE 111* 101*  --  115* 124* 103*  BUN 39* 38*  --  34* 26* 24*  CREATININE 1.45* 1.32*  --  1.33* 1.10* 1.16*  CALCIUM 8.6* 8.8*  --  8.9 9.3 8.2*  MG  --   --  1.8  --   --  1.4*  PHOS  --   --   --   --   --  4.5   Liver Function Tests:  Recent Labs Lab 08/15/14 0500 08/16/14 0429 08/17/14 0450 08/19/14 2120 08/20/14 0623  AST 38 27 25 24 22   ALT 30 25 22 20 18   ALKPHOS 42 41 43 39 36*  BILITOT 0.6 0.5  0.6 0.8 0.8  PROT 6.8 6.6 6.4* 6.5 6.0*  ALBUMIN 3.7 3.5 3.4* 3.4* 3.1*   No results for input(s): LIPASE, AMYLASE in the last 168 hours. No results for input(s): AMMONIA in the last 168 hours. CBC:  Recent Labs Lab 08/16/14 0429 08/17/14 0450 08/19/14 1454 08/20/14 0623 08/21/14 0508  WBC 8.5 7.9 8.4 8.4 7.6  HGB 10.5* 10.3* 11.0* 10.3* 10.2*  HCT 33.8* 32.8* 35.1* 32.1* 31.8*  MCV 94.4 93.2 90.9 90.9 91.1  PLT 152 160 171 156 159   Cardiac Enzymes:  Recent Labs Lab 08/19/14 2301 08/20/14 0623 08/20/14 1225  TROPONINI 0.03 0.03 0.03   BNP (last 3 results)  Recent Labs  08/16/14 0855 08/19/14 1523  BNP 1169.9* 1627.6*    ProBNP (last 3 results) No results for input(s): PROBNP in the last 8760 hours.  CBG:  Recent Labs Lab 08/19/14 1528  GLUCAP 109*    Recent Results (from the past 240 hour(s))  H.pylori Antigen, Stool     Status: None   Collection  Time: 08/11/14  6:40 PM  Result Value Ref Range Status   Specimen Description STOOL  Final   Special Requests NONE  Final   H. pylori ag, stool NEGATIVE Performed at [facility]   Final   Report Status 08/13/2014 FINAL  Final  Surgical pcr screen     Status: None   Collection Time: 08/13/14  9:53 AM  Result Value Ref Range Status   MRSA, PCR NEGATIVE NEGATIVE Final   Staphylococcus aureus NEGATIVE NEGATIVE Final    Comment:        The Xpert SA Assay (FDA approved for NASAL specimens in patients over 23 years of age), is one component of a comprehensive surveillance program.  Test performance has been validated by [facility] for patients greater than or equal to 42 year old. It is not intended to diagnose infection nor to guide or monitor treatment.   Blood culture (routine x 2)     Status: None (Preliminary result)   Collection Time: 08/19/14  3:22 PM  Result Value Ref Range Status   Specimen Description BLOOD RIGHT ANTECUBITAL  Final   Special Requests BOTTLES DRAWN AEROBIC AND ANAEROBIC 5CC  Final   Culture   Final    NO GROWTH < 24 HOURS Performed at [facility]    Report Status PENDING  Incomplete  Blood culture (routine x 2)     Status: None (Preliminary result)   Collection Time: 08/19/14  3:50 PM  Result Value Ref Range Status   Specimen Description BLOOD RIGHT HAND  Final   Special Requests BOTTLES DRAWN AEROBIC AND ANAEROBIC 3ML  Final   Culture   Final    NO GROWTH < 24 HOURS Performed at [facility]    Report Status PENDING  Incomplete     Studies: Dg Chest 2 View  08/19/2014   CLINICAL DATA:  Shortness of breath and weakness  EXAM: CHEST  2 VIEW  COMPARISON:  August 16, 2014  FINDINGS: There is cardiomegaly with pulmonary vascular within normal limits. There is patchy consolidation in both lung bases with small pleural effusions. No adenopathy. There is postoperative change in the left breast region.  IMPRESSION: Suspect a degree of  congestive heart failure. There may be superimposed pneumonia in the bases.   Electronically Signed   By: Lowella Grip III M.D.   On: 08/19/2014 15:49   Ct Angio Chest Pe W/cm &/or Wo Cm  08/19/2014  CLINICAL DATA:  Progressive shortness of breath following a cholecystectomy 1 week ago.  EXAM: CT ANGIOGRAPHY CHEST WITH CONTRAST  TECHNIQUE: Multidetector CT imaging of the chest was performed using the standard protocol during bolus administration of intravenous contrast. Multiplanar CT image reconstructions and MIPs were obtained to evaluate the vascular anatomy.  CONTRAST:  60mL OMNIPAQUE IOHEXOL 350 MG/ML SOLN  COMPARISON:  Chest radiographs obtained earlier today. Chest CT dated 09/19/2003.  FINDINGS: Diffusely enlarged heart. Small to moderate-sized bilateral pleural effusions, larger on the right. Adjacent bilateral lower lobe atelectasis. Mildly prominent interstitial markings. Mild bullous changes.  Small number of small to moderate-sized pulmonary arterial filling defects bilaterally. The right ventricular 2 left ventricular ratio is difficult to determine due to lack of opacification of the left ventricle. The ratio is approximately 1.2.  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 Thoracic spine degenerative changes are noted. No lung nodules or enlarged lymph nodes.  Review of the MIP images confirms the above findings.  IMPRESSION: 1. Multiple small to moderate-sized bilateral pulmonary emboli. 2. CT evidence of right heart strain (RV/LV Ratio = 1.2) consistent with at least submassive (intermediate risk) PE. The presence of right heart strain has been associated with an increased risk of morbidity and mortality. Please activate Code PE by paging [phone]. 3. Small to moderate-sized bilateral pleural effusions, larger on the right. 4. Bilateral lower lobe atelectasis. 5. Mild changes of COPD with mild superimposed interstitial pulmonary edema. Critical Value/emergent results  were called by telephone at the time of interpretation on 08/19/2014 at 8:49 pm to Dr. Toy Baker , who verbally acknowledged these results.   Electronically Signed   By: Claudie Revering M.D.   On: 08/19/2014 20:58    Scheduled Meds: . aspirin EC  81 mg Oral Daily  . furosemide  20 mg Intravenous Q12H  . levothyroxine  25 mcg Oral QAC breakfast  . piperacillin-tazobactam (ZOSYN)  IV  3.375 g Intravenous 3 times per day  . vancomycin  750 mg Intravenous Q12H   Continuous Infusions: . heparin 1,000 Units/hr (08/21/14 0600)    Time Spent: 35 min   FELIZ Marguarite Arbour  [facility] Pager [phone]. If 7PM-7AM, please contact night-coverage at [email or web address], password [email or web address] 08/21/2014, 7:17 AM  LOS: 2 days

## 2014-08-21 NOTE — Progress Notes (Signed)
ANTICOAGULATION CONSULT NOTE - Follow Up Consult  Pharmacy Consult for Heparin Indication: pulmonary embolus and DVT  Allergies  Allergen Reactions  . Cymbalta [Duloxetine Hcl] Other (See Comments)    Depression  . Lyrica [Pregabalin] Other (See Comments)    Blurry vision    Patient Measurements: Height: 5\' 4"  (162.6 cm) Weight: 202 lb 9.6 oz (91.9 kg) IBW/kg (Calculated) : 54.7 Heparin Dosing Weight: 76.5 kg  Vital Signs: Temp: 98.6 F (37 C) (08/03 0400) Temp Source: Oral (08/03 0400) BP: 105/52 mmHg (08/03 0600) Pulse Rate: 95 (08/03 0600)  Labs:  Recent Labs  08/19/14 1454 08/19/14 2120 08/19/14 2301  08/20/14 0623 08/20/14 1225 08/20/14 1535 08/20/14 2300 08/21/14 0508  HGB 11.0*  --   --   --  10.3*  --   --   --  10.2*  HCT 35.1*  --   --   --  32.1*  --   --   --  31.8*  PLT 171  --   --   --  156  --   --   --  159  APTT  --  28  --   --   --   --   --   --   --   LABPROT  --  18.3*  --   --   --   --   --   --   --   INR  --  1.51*  --   --   --   --   --   --   --   HEPARINUNFRC  --   --   --   < > 0.90*  --  0.53 0.57 0.57  CREATININE 1.10*  --   --   --  1.16*  --   --   --   --   TROPONINI  --   --  0.03  --  0.03 0.03  --   --   --   < > = values in this interval not displayed.  Estimated Creatinine Clearance: 39 mL/min (by C-G formula based on Cr of 1.16).  Assessment: 72 y/oF with PMH of CKD stage 3, hypothyroidism, HTN, peripheral neuropathy who was recently hospitalized for cholecystitis, cardiomyopathy, and HCAP and now presents to [facility] ED with worsening dyspnea and generalized weakness. CXR findings suggestive of heart failure vs. pneumonia.  CT angio of chest positive for multiple small to moderate-sized bilateral pulmonary emboli.  [place] positive for LE DVT, right distal femoral/popliteal veins and left gastrocnemius vein.  Pharmacy consulted to dose heparin infusion.      Today, 08/21/2014:   Heparin level 0.57, remains therapeutic, on heparin  1000 units/hr  No bleeding/complications reported  Hgb stable at 10.2, Plts stable at 159K  SCr 1.16 with CrCl ~ 39 ml/min  Goal of Therapy:  Heparin level 0.3-0.7 units/ml Monitor platelets by anticoagulation protocol: Yes   Plan:   Continue heparin IV infusion at 1000 units/hr  Daily heparin level and CBC.  Monitor closely for s/s of bleeding.  Follow up long-term anticoagulation plan.   Gretta Arab PharmD, BCPS Pager [phone] 08/21/2014 7:30 AM

## 2014-08-21 NOTE — Progress Notes (Signed)
ANTIBIOTIC CONSULT NOTE - Follow up  Pharmacy Consult for Vancomycin, Zosyn Indication: HCAP  See pharmacist note from earlier today for further detail.  Vancomycin level obtained tonight is therapeutic at 18 mcg/ml.  Plan: Continue current dosing of vancomycin at 750 mg IV q12h. Continue to f/u SCr (trended up slightly today), trough levels as indicated.  Hershal Coria, PharmD, BCPS Pager: [phone] 08/21/2014 5:39 PM

## 2014-08-21 NOTE — Progress Notes (Signed)
ANTICOAGULATION CONSULT NOTE - Follow Up Consult  Pharmacy Consult for Heparin Indication: pulmonary embolus  Allergies  Allergen Reactions  . Cymbalta [Duloxetine Hcl] Other (See Comments)    Depression  . Lyrica [Pregabalin] Other (See Comments)    Blurry vision    Patient Measurements: Height: 5\' 4"  (162.6 cm) Weight: 209 lb 14.1 oz (95.2 kg) IBW/kg (Calculated) : 54.7 Heparin Dosing Weight: 76.5 kg  Vital Signs: Temp: 98.2 F (36.8 C) (08/02 1911) Temp Source: Oral (08/02 1911) BP: 102/54 mmHg (08/02 2200) Pulse Rate: 105 (08/02 2200)  Labs:  Recent Labs  08/19/14 1454 08/19/14 2120 08/19/14 2301 08/20/14 0623 08/20/14 1225 08/20/14 1535 08/20/14 2300  HGB 11.0*  --   --  10.3*  --   --   --   HCT 35.1*  --   --  32.1*  --   --   --   PLT 171  --   --  156  --   --   --   APTT  --  28  --   --   --   --   --   LABPROT  --  18.3*  --   --   --   --   --   INR  --  1.51*  --   --   --   --   --   HEPARINUNFRC  --   --   --  0.90*  --  0.53 0.57  CREATININE 1.10*  --   --  1.16*  --   --   --   TROPONINI  --   --  0.03 0.03 0.03  --   --     Estimated Creatinine Clearance: 39.7 mL/min (by C-G formula based on Cr of 1.16).  Assessment: 63 y/oF with PMH of CKD stage 3, hypothyroidism, HTN, peripheral neuropathy who was recently hospitalized for cholecystitis, cardiomyopathy, and HCAP and now presents to [facility] ED with worsening dyspnea and generalized weakness. CXR findings suggestive of heart failure vs. pneumonia.  CT angio of chest positive for multiple small to moderate-sized bilateral pulmonary emboli. Pharmacy consulted to assist with dosing of heparin infusion for this patient.   No anticoagulants PTA  Baseline coags: PT/INR 18.3/1.51 (slightly elevated), aPTT 28  LFTs, Tbili WNL  CBC: Hgb 11 (stable compared to previous values), Pltc WNL  SCr 1.1 with CrCl ~ 42 ml/min  08/20/14  HL=0.57 (therapeutic x 2 ), on 1000 units/hr  No  bleeding/complications reported  Hgb stable, Plts slightly decreased  Goal of Therapy:  Heparin level 0.3-0.7 units/ml Monitor platelets by anticoagulation protocol: Yes   Plan: .  Continue heparin infusion at 1000 units/hr  Daily heparin level and CBC.  Monitor closely for s/s of bleeding.   Dorrene German 08/21/2014 12:08 AM

## 2014-08-21 NOTE — Care Management Important Message (Signed)
Important Message  Patient Details  Name: Alexandra [name] MRN: [id number] Date of Birth: July 13, 1927   Medicare Important Message Given:  [facility] notification given    Camillo Flaming 08/21/2014, 12:43 [place] Message  Patient Details  Name: Alexandra [name] MRN: [id number] Date of Birth: 1927-09-29   Medicare Important Message Given:  Yes-second notification given    Camillo Flaming 08/21/2014, 12:43 PM

## 2014-08-22 LAB — CBC
HCT: 32.2 % — ABNORMAL LOW (ref 36.0–46.0)
HEMOGLOBIN: 10.1 g/dL — AB (ref 12.0–15.0)
MCH: 28.4 pg (ref 26.0–34.0)
MCHC: 31.4 g/dL (ref 30.0–36.0)
MCV: 90.4 fL (ref 78.0–100.0)
PLATELETS: 160 10*3/uL (ref 150–400)
RBC: 3.56 MIL/uL — ABNORMAL LOW (ref 3.87–5.11)
RDW: 14.7 % (ref 11.5–15.5)
WBC: 8.3 10*3/uL (ref 4.0–10.5)

## 2014-08-22 LAB — BASIC METABOLIC PANEL
ANION GAP: 10 (ref 5–15)
ANION GAP: 8 (ref 5–15)
BUN: 22 mg/dL — AB (ref 6–20)
BUN: 23 mg/dL — AB (ref 6–20)
CALCIUM: 8.7 mg/dL — AB (ref 8.9–10.3)
CO2: 31 mmol/L (ref 22–32)
CO2: 30 mmol/L (ref 22–32)
CREATININE: 1.6 mg/dL — AB (ref 0.44–1.00)
Calcium: 8.3 mg/dL — ABNORMAL LOW (ref 8.9–10.3)
Chloride: 99 mmol/L — ABNORMAL LOW (ref 101–111)
Chloride: 100 mmol/L — ABNORMAL LOW (ref 101–111)
Creatinine, Ser: 1.53 mg/dL — ABNORMAL HIGH (ref 0.44–1.00)
GFR calc non Af Amer: 28 mL/min — ABNORMAL LOW (ref >60)
GFR calc non Af Amer: 30 mL/min — ABNORMAL LOW (ref >60)
GFR, EST AFRICAN AMERICAN: 33 mL/min — AB (ref >60)
GFR, EST AFRICAN AMERICAN: 35 mL/min — AB (ref >60)
Glucose, Bld: 98 mg/dL (ref 65–99)
Glucose, Bld: 111 mg/dL — ABNORMAL HIGH (ref 65–99)
POTASSIUM: 3.7 mmol/L (ref 3.5–5.1)
Potassium: 4 mmol/L (ref 3.5–5.1)
Sodium: 140 mmol/L (ref 135–145)
Sodium: 138 mmol/L (ref 135–145)

## 2014-08-22 LAB — URINE CULTURE: SPECIAL REQUESTS: NORMAL

## 2014-08-22 LAB — HEPARIN LEVEL (UNFRACTIONATED): Heparin Unfractionated: 0.32 IU/mL (ref 0.30–0.70)

## 2014-08-22 LAB — GLUCOSE, CAPILLARY: Glucose-Capillary: 105 mg/dL — ABNORMAL HIGH (ref 65–99)

## 2014-08-22 MED ORDER — FUROSEMIDE 20 MG PO TABS
20.0000 mg | ORAL_TABLET | Freq: Every day | ORAL | Status: DC
  Administered 2014-08-23: 20 mg via ORAL
  Filled 2014-08-22: qty 1

## 2014-08-22 MED ORDER — SODIUM CHLORIDE 0.9 % IV BOLUS (SEPSIS)
250.0000 mL | Freq: Once | INTRAVENOUS | Status: AC
  Administered 2014-08-22: 250 mL via INTRAVENOUS

## 2014-08-22 MED ORDER — RIVAROXABAN 15 MG PO TABS: 15.0000 mg | ORAL_TABLET | Freq: Two times a day (BID) | ORAL | Status: DC

## 2014-08-22 MED ORDER — FUROSEMIDE 20 MG PO TABS
20.0000 mg | ORAL_TABLET | Freq: Every day | ORAL | Status: DC
Start: 2014-08-22 — End: 2014-08-22
  Filled 2014-08-22: qty 1

## 2014-08-22 MED ORDER — RIVAROXABAN 15 MG PO TABS
15.0000 mg | ORAL_TABLET | Freq: Two times a day (BID) | ORAL | Status: DC
  Administered 2014-08-22 – 2014-08-23 (×2): 15 mg via ORAL
  Filled 2014-08-22 (×4): qty 1

## 2014-08-22 MED ORDER — CARVEDILOL 3.125 MG PO TABS
3.1250 mg | ORAL_TABLET | Freq: Two times a day (BID) | ORAL | Status: DC
  Administered 2014-08-22 – 2014-08-23 (×2): 3.125 mg via ORAL
  Filled 2014-08-22 (×5): qty 1

## 2014-08-22 MED ORDER — FUROSEMIDE 20 MG PO TABS: 20.0000 mg | ORAL_TABLET | Freq: Every day | ORAL | Status: DC | PRN

## 2014-08-22 MED ORDER — LEVOFLOXACIN 750 MG PO TABS
750.0000 mg | ORAL_TABLET | ORAL | Status: DC
  Administered 2014-08-22: 750 mg via ORAL
  Filled 2014-08-22: qty 1

## 2014-08-22 MED ORDER — ZOLPIDEM TARTRATE 5 MG PO TABS: 5.0000 mg | ORAL_TABLET | Freq: Every evening | ORAL | Status: DC | PRN

## 2014-08-22 MED ORDER — LEVOFLOXACIN 500 MG PO TABS
500.0000 mg | ORAL_TABLET | Freq: Every day | ORAL | Status: DC
  Filled 2014-08-22: qty 1

## 2014-08-22 NOTE — Progress Notes (Addendum)
ANTICOAGULATION CONSULT NOTE - Follow Up Consult  Pharmacy Consult for Heparin to xarelto per [facility] Indication: pulmonary embolus and DVT  Allergies  Allergen Reactions  . Cymbalta [Duloxetine Hcl] Other (See Comments)    Depression  . Lyrica [Pregabalin] Other (See Comments)    Blurry vision    Patient Measurements: Height: 5\' 4"  (162.6 cm) Weight: 196 lb 13.9 oz (89.3 kg) IBW/kg (Calculated) : 54.7 Heparin Dosing Weight: 76.5 kg  Vital Signs: Temp: 98.2 F (36.8 C) (08/04 0518) Temp Source: Oral (08/04 0518) BP: 114/63 mmHg (08/04 0518) Pulse Rate: 105 (08/04 0518)  Labs:  Recent Labs  08/19/14 2120 08/19/14 2301 08/20/14 [id number] 08/20/14 1225  08/20/14 2300 08/21/14 0508 08/21/14 0816 08/22/14 0518  HGB  --   --  10.3*  --   --   --  10.2*  --  10.1*  HCT  --   --  32.1*  --   --   --  31.8*  --  32.2*  PLT  --   --  156  --   --   --  159  --  160  APTT 28  --   --   --   --   --   --   --   --   LABPROT 18.3*  --   --   --   --   --   --   --   --   INR 1.51*  --   --   --   --   --   --   --   --   HEPARINUNFRC  --   --  0.90*  --   < > 0.57 0.57  --  0.32  CREATININE  --   --  1.16*  --   --   --   --  1.33* 1.60*  TROPONINI  --  0.03 0.03 0.03  --   --   --   --   --   < > = values in this interval not displayed.  Estimated Creatinine Clearance: 27.8 mL/min (by C-G formula based on Cr of 1.6).  Assessment: 20 y/oF with PMH of CKD stage 3, hypothyroidism, HTN, peripheral neuropathy who was recently hospitalized for cholecystitis, cardiomyopathy, and HCAP and now presents to [facility] ED with worsening dyspnea and generalized weakness. CXR findings suggestive of heart failure vs. pneumonia.  CT angio of chest positive for multiple small to moderate-sized bilateral pulmonary emboli.  [place] positive for LE DVT, right distal femoral/popliteal veins and left gastrocnemius vein.  Pharmacy consulted to dose heparin infusion.      Today, 08/22/2014:   Heparin level 0.32, remains  therapeutic, on heparin 1000 units/hr  No bleeding/complications reported  Hgb stable, Plts stable  SCr 1.60 with CrCl ~ 28 ml/min  Goal of Therapy:  Heparin level 0.3-0.7 units/ml Monitor platelets by anticoagulation protocol: Yes   Plan:   Continue heparin IV infusion at 1000 units/hr until 5pm tonight and start rivaroxaban at 5pm per physician orders  Physician aware of CrCl <30 but feels will improve with stopping furosemide.  Checking labs at 4pm but wishes to continue with current orders at this time  D/C heparin level and CBC.  Monitor closely for s/s of bleeding and renal function.  Educate 8/5 if xarelto remains plan for discharge  Antibiotics changed from vanco/zosyn to levofloxacin - renally adjust levofloxacin to 750mg  PO q48h.  Doreene Eland, PharmD, BCPS.   Pager: [phone] 08/22/2014 9:36 AM

## 2014-08-22 NOTE — Progress Notes (Signed)
Pharmacist Heart Failure Core Measure Documentation  Assessment: Alexandra [name] has an EF documented as 20-25% on 08/20/2014 by ECHO.  Rationale: Heart failure patients with left ventricular systolic dysfunction (LVSD) and an EF < 40% should be prescribed an angiotensin converting enzyme inhibitor (ACEI) or angiotensin receptor blocker (ARB) at discharge unless a contraindication is documented in the medical record.  This patient is not currently on an ACEI or ARB for HF.  This note is being placed in the record in order to provide documentation that a contraindication to the use of these agents is present for this encounter.  ACE Inhibitor or Angiotensin Receptor Blocker is contraindicated (specify all that apply)  []   ACEI allergy AND ARB allergy []   Angioedema []   Moderate or severe aortic stenosis []   Hyperkalemia []   Hypotension []   Renal artery stenosis [x]   Worsening renal function, preexisting renal disease or dysfunction   [name] 08/22/2014 1:56 PM

## 2014-08-22 NOTE — Evaluation (Signed)
Physical Therapy Evaluation Patient Details Name: Alexandra [name] MRN: [id number] DOB: [birth year] Today's Date: 08/22/2014   History of Present Illness  81 yo female admitted 08/19/14 with SOB, positive bilateral  PE, plueral effusion and pneumonisa. Hx of LBBB, HTN, falls, peripheral neuropathy, obesity, osteopenia. Pt is from home alone. S/P Laparoscopic cholecystectomy 7/26  Clinical Impression  Patient is very weak today. Limited ambulation, sats 100% on 2 l, O2, HR 106. Patient declines snf rehab, daughter reports that family is not available 24/7.patient will benefiot from PT while uin acute care to address problems listed in note below(PT Problem List).     Follow Up Recommendations SNF;Supervision/Assistance - 24 hour (patient reports that she does not want SNF rehab, spoke with daughter again who reports family is not available 24/7.)    Equipment Recommendations  None recommended by PT    Recommendations for Other Services       Precautions / Restrictions Precautions Precautions: Fall Precaution Comments: monitor sats Restrictions Weight Bearing Restrictions: No      Mobility  Bed Mobility Overal bed mobility: Needs Assistance Bed Mobility: Supine to Sit     Supine to sit: Min assist;HOB elevated     General bed mobility comments: assist to right trunk`  Transfers Overall transfer level: Needs assistance Equipment used: Rolling walker (2 wheeled) Transfers: Sit to/from Stand Sit to Stand: Mod assist            Ambulation/Gait Ambulation/Gait assistance: Mod assist Ambulation Distance (Feet): 20 Feet Assistive device: Rolling walker (2 wheeled) Gait Pattern/deviations: Decreased stride length;Trunk flexed Gait velocity: slow   General Gait Details: 3/4 dyspnea with small amount of activity.. Cue for safety with RW. HR 106, sats 100% on 2 l  Stairs            Wheelchair Mobility    Modified Rankin (Stroke Patients Only)       Balance                                              Pertinent Vitals/Pain Pain Score: 4  Pain Location: legs Pain Descriptors / Indicators: Sore;Tender Pain Intervention(s): Limited activity within patient's tolerance;Monitored during session;Repositioned    Home Living Family/patient expects to be discharged to:: Private residence Living Arrangements: Alone Available Help at Discharge: Family;Available PRN/intermittently Type of Home: House Home Access: Stairs to enter Entrance Stairs-Rails: Can reach both Entrance Stairs-Number of Steps: 2 Home Layout: Two level;Able to live on main level with bedroom/bathroom Home Equipment: Latina Craver - 2 wheels Additional Comments: daughter reports  family unable to provide 24/7    Prior Function Level of Independence: Needs assistance   Gait / Transfers Assistance Needed: was limited ambulation following lap chole, home 1 day.           Hand Dominance        Extremity/Trunk Assessment   Upper Extremity Assessment: Generalized weakness           Lower Extremity Assessment: RLE deficits/detail;LLE deficits/detail;Generalized weakness         Communication      Cognition Arousal/Alertness: Awake/alert Behavior During Therapy: WFL for tasks assessed/performed Overall Cognitive Status: Within Functional Limits for tasks assessed                      General Comments      Exercises  Assessment/Plan    PT Assessment Patient needs continued PT services  PT Diagnosis Difficulty walking;Generalized weakness;Acute pain   PT Problem List Decreased strength;Decreased balance;Decreased activity tolerance;Decreased mobility;Decreased cognition  PT Treatment Interventions DME instruction;Gait training;Functional mobility training;Therapeutic activities;Patient/family education;Therapeutic exercise;Balance training   PT Goals (Current goals can be found in the Care Plan section) Acute Rehab PT  Goals Patient Stated Goal: to get my strength back, PT Goal Formulation: With patient/family Time For Goal Achievement: 09/05/14 Potential to Achieve Goals: Good    Frequency Min 3X/week   Barriers to discharge Decreased caregiver support      Co-evaluation               End of Session   Activity Tolerance: Patient limited by fatigue Patient left: in chair;with call bell/phone within reach;with family/visitor present;with nursing/sitter in room Nurse Communication: Mobility status         Time: [id number] PT Time Calculation (min) (ACUTE ONLY): 26 min   Charges:   PT Evaluation $Initial PT Evaluation Tier I: 1 Procedure PT Treatments $Gait Training: 8-22 mins   PT G Codes:        Claretha Cooper 08/22/2014, 10:30 AM Tresa Endo PT [phone]

## 2014-08-22 NOTE — Progress Notes (Signed)
[facility] PROGRESS NOTE Interim History: 81 y.o. 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 Last echogram showed EF of 20%. Hospital stay was complicated by SVT for which her Coreg dose has been adjusted. The day of the discharge she was found to have a new right base infiltrate discharge her on by mouth antibiotics   Assessment/Plan: Acute  Pulmonary embolism - CT angio of chest showed new PE, lower et DVT positive.  - Change anticoagulation to Xarelto this afternoon. GFR is 28, but it is probably die to overdiuresis. B-met in the afternoon. - baseline Cr 1.1= GFR >40 ml/min  Acute chronic systolic heart failure due to takatsubo syndrome: - Now euvolemic - strict I and O's, change to oral lasix. - Liberalize diet - pt consult pending.  HCAP (healthcare-associated pneumonia) - D escalated antibiotic treatment to Levaquin.  CKD (chronic kidney disease) stage 3, GFR 30-59 ml/min - Mildly elevated.  Anemia of chronic disease - Mild drop in hbg compare to previous. - monitor.  Essential (primary) hypertension - At goal   Code Status: full Family Communication: daughter  Disposition Plan: inpatient   Consultants:  none  Procedures:  CT angio  Antibiotics:  vanc and [id number]>>>  HPI/Subjective: She relates her SOB is resolved  Objective: Filed Vitals:   08/21/14 2105 08/22/14 0211 08/22/14 0518 08/22/14 0712  BP: 102/60 109/57 114/63   Pulse: 105 108 105   Temp: 97.9 F (36.6 C) 97.9 F (36.6 C) 98.2 F (36.8 C)   TempSrc: Oral Oral Oral   Resp: '16 18 16   ' Height:      Weight:    89.3 kg (196 lb 13.9 oz)  SpO2: 95% 100% 100%     Intake/Output Summary (Last 24 hours) at 08/22/14 0722 Last data filed at 08/22/14 0301  Gross per 24 hour  Intake   1230 ml  Output   1200 ml  Net     30 ml   Filed Weights   08/20/14 0500 08/21/14 0500 08/22/14 0712  Weight: 95.2 kg (209 lb 14.1 oz) 91.9 kg (202 lb 9.6 oz) 89.3 kg (196 lb 13.9 oz)    Exam:  General: Alert, awake, oriented x3, in no acute distress.  HEENT: No bruits, no goiter. - JVD Heart: Regular rate and rhythm, 2+ lower ext edema Lungs: Good air movement, clear Abdomen: Soft, nontender, nondistended, positive bowel sounds.  Neuro: Grossly intact, nonfocal.   Data Reviewed: Basic Metabolic Panel:  Recent Labs Lab 08/16/14 0855 08/17/14 0450 08/19/14 1454 08/20/14 0623 08/21/14 0816 08/22/14 0518  NA  --  143 139 137 138 140  K  --  4.0 4.7 4.2 4.4 3.7  CL  --  109 105 105 104 99*  CO2  --  '26 26 26 26 31  ' GLUCOSE  --  115* 124* 103* 92 98  BUN  --  34* 26* 24* 22* 22*  CREATININE  --  1.33* 1.10* 1.16* 1.33* 1.60*  CALCIUM  --  8.9 9.3 8.2* 8.6* 8.7*  MG 1.8  --   --  1.4*  --   --   PHOS  --   --   --  4.5  --   --    Liver Function Tests:  Recent Labs Lab 08/16/14 0429 08/17/14 0450 08/19/14 2120 08/20/14 0623  AST '27 25 24 22  ' ALT '25 22 20 18  ' ALKPHOS 41 43  39 36*  BILITOT 0.5 0.6 0.8 0.8  PROT 6.6 6.4* 6.5 6.0*  ALBUMIN 3.5 3.4* 3.4* 3.1*   No results for input(s): LIPASE, AMYLASE in the last 168 hours. No results for input(s): AMMONIA in the last 168 hours. CBC:  Recent Labs Lab 08/17/14 0450 08/19/14 1454 08/20/14 0623 08/21/14 0508 08/22/14 0518  WBC 7.9 8.4 8.4 7.6 8.3  HGB 10.3* 11.0* 10.3* 10.2* 10.1*  HCT 32.8* 35.1* 32.1* 31.8* 32.2*  MCV 93.2 90.9 90.9 91.1 90.4  PLT 160 171 156 159 160   Cardiac Enzymes:  Recent Labs Lab 08/19/14 2301 08/20/14 0623 08/20/14 1225  TROPONINI 0.03 0.03 0.03   BNP (last 3 results)  Recent Labs  08/16/14 0855 08/19/14 1523  BNP 1169.9* 1627.6*    ProBNP (last 3 results) No results for input(s): PROBNP in the last 8760 hours.  CBG:  Recent Labs Lab 08/19/14 1528  GLUCAP 109*    Recent Results (from the past 240 hour(s))  Surgical  pcr screen     Status: None   Collection Time: 08/13/14  9:53 AM  Result Value Ref Range Status   MRSA, PCR NEGATIVE NEGATIVE Final   Staphylococcus aureus NEGATIVE NEGATIVE Final    Comment:        The Xpert SA Assay (FDA approved for NASAL specimens in patients over 57 years of age), is one component of a comprehensive surveillance program.  Test performance has been validated by [facility] for patients greater than or equal to 69 year old. It is not intended to diagnose infection nor to guide or monitor treatment.   Blood culture (routine x 2)     Status: None (Preliminary result)   Collection Time: 08/19/14  3:22 PM  Result Value Ref Range Status   Specimen Description BLOOD RIGHT ANTECUBITAL  Final   Special Requests BOTTLES DRAWN AEROBIC AND ANAEROBIC 5CC  Final   Culture   Final    NO GROWTH 2 DAYS Performed at [facility]    Report Status PENDING  Incomplete  Blood culture (routine x 2)     Status: None (Preliminary result)   Collection Time: 08/19/14  3:50 PM  Result Value Ref Range Status   Specimen Description BLOOD RIGHT HAND  Final   Special Requests BOTTLES DRAWN AEROBIC AND ANAEROBIC 3ML  Final   Culture   Final    NO GROWTH 2 DAYS Performed at [facility]    Report Status PENDING  Incomplete  Urine culture     Status: None (Preliminary result)   Collection Time: 08/19/14  5:47 PM  Result Value Ref Range Status   Specimen Description URINE, RANDOM  Final   Special Requests Normal  Final   Culture   Final    CULTURE REINCUBATED FOR BETTER GROWTH Performed at [facility]    Report Status PENDING  Incomplete     Studies: No results found.  Scheduled Meds: . aspirin EC  81 mg Oral Daily  . levothyroxine  25 mcg Oral QAC breakfast  . piperacillin-tazobactam (ZOSYN)  IV  3.375 g Intravenous 3 times per day  . vancomycin  750 mg Intravenous Q12H   Continuous Infusions: . heparin 1,000 Units/hr (08/21/14 2225)    Time  Spent: 35 min   FELIZ Marguarite Arbour  [facility] Pager [phone]. If 7PM-7AM, please contact night-coverage at [email or web address], password [facility] 08/22/2014, 7:22 AM  LOS: 3 days

## 2014-08-23 DIAGNOSIS — Z5189 Encounter for other specified aftercare: Secondary | ICD-10-CM | POA: Diagnosis not present

## 2014-08-23 DIAGNOSIS — I429 Cardiomyopathy, unspecified: Secondary | ICD-10-CM | POA: Diagnosis not present

## 2014-08-23 DIAGNOSIS — J9601 Acute respiratory failure with hypoxia: Secondary | ICD-10-CM | POA: Diagnosis not present

## 2014-08-23 DIAGNOSIS — E038 Other specified hypothyroidism: Secondary | ICD-10-CM | POA: Diagnosis not present

## 2014-08-23 DIAGNOSIS — R278 Other lack of coordination: Secondary | ICD-10-CM | POA: Diagnosis not present

## 2014-08-23 DIAGNOSIS — J189 Pneumonia, unspecified organism: Secondary | ICD-10-CM | POA: Diagnosis not present

## 2014-08-23 DIAGNOSIS — D638 Anemia in other chronic diseases classified elsewhere: Secondary | ICD-10-CM | POA: Diagnosis not present

## 2014-08-23 DIAGNOSIS — R5381 Other malaise: Secondary | ICD-10-CM | POA: Diagnosis not present

## 2014-08-23 DIAGNOSIS — M6281 Muscle weakness (generalized): Secondary | ICD-10-CM | POA: Diagnosis not present

## 2014-08-23 DIAGNOSIS — R2681 Unsteadiness on feet: Secondary | ICD-10-CM | POA: Diagnosis not present

## 2014-08-23 DIAGNOSIS — I5043 Acute on chronic combined systolic (congestive) and diastolic (congestive) heart failure: Secondary | ICD-10-CM | POA: Diagnosis not present

## 2014-08-23 DIAGNOSIS — I5181 Takotsubo syndrome: Secondary | ICD-10-CM | POA: Diagnosis not present

## 2014-08-23 DIAGNOSIS — N183 Chronic kidney disease, stage 3 (moderate): Secondary | ICD-10-CM | POA: Diagnosis not present

## 2014-08-23 DIAGNOSIS — R093 Abnormal sputum: Secondary | ICD-10-CM | POA: Diagnosis not present

## 2014-08-23 DIAGNOSIS — I2699 Other pulmonary embolism without acute cor pulmonale: Secondary | ICD-10-CM | POA: Diagnosis not present

## 2014-08-23 DIAGNOSIS — I10 Essential (primary) hypertension: Secondary | ICD-10-CM | POA: Diagnosis not present

## 2014-08-23 DIAGNOSIS — I471 Supraventricular tachycardia: Secondary | ICD-10-CM | POA: Diagnosis not present

## 2014-08-23 LAB — BASIC METABOLIC PANEL
Anion gap: 11 (ref 5–15)
BUN: 19 mg/dL (ref 6–20)
CALCIUM: 8.8 mg/dL — AB (ref 8.9–10.3)
CHLORIDE: 100 mmol/L — AB (ref 101–111)
CO2: 29 mmol/L (ref 22–32)
Creatinine, Ser: 1.29 mg/dL — ABNORMAL HIGH (ref 0.44–1.00)
GFR, EST AFRICAN AMERICAN: 43 mL/min — AB (ref >60)
GFR, EST NON AFRICAN AMERICAN: 37 mL/min — AB (ref >60)
Glucose, Bld: 97 mg/dL (ref 65–99)
POTASSIUM: 3.9 mmol/L (ref 3.5–5.1)
Sodium: 140 mmol/L (ref 135–145)

## 2014-08-23 LAB — CBC
HCT: 33.2 % — ABNORMAL LOW (ref 36.0–46.0)
Hemoglobin: 10.3 g/dL — ABNORMAL LOW (ref 12.0–15.0)
MCH: 28.4 pg (ref 26.0–34.0)
MCHC: 31 g/dL (ref 30.0–36.0)
MCV: 91.5 fL (ref 78.0–100.0)
Platelets: 165 10*3/uL (ref 150–400)
RBC: 3.63 MIL/uL — ABNORMAL LOW (ref 3.87–5.11)
RDW: 15 % (ref 11.5–15.5)
WBC: 7.3 10*3/uL (ref 4.0–10.5)

## 2014-08-23 LAB — PROCALCITONIN: Procalcitonin: <0.1 ng/mL

## 2014-08-23 MED ORDER — FUROSEMIDE 20 MG PO TABS: 20.0000 mg | ORAL_TABLET | Freq: Every day | ORAL | Status: DC

## 2014-08-23 MED ORDER — CARVEDILOL 3.125 MG PO TABS: 3.1250 mg | ORAL_TABLET | Freq: Two times a day (BID) | ORAL | Status: DC

## 2014-08-23 MED ORDER — LEVOFLOXACIN 750 MG PO TABS: 750.0000 mg | ORAL_TABLET | ORAL | Status: DC

## 2014-08-23 MED ORDER — ALPRAZOLAM 0.25 MG PO TABS: ORAL_TABLET | ORAL | Status: DC

## 2014-08-23 MED ORDER — RIVAROXABAN 20 MG PO TABS: 20.0000 mg | ORAL_TABLET | Freq: Every day | ORAL | Status: DC

## 2014-08-23 MED ORDER — SACCHAROMYCES BOULARDII 250 MG PO CAPS
250.0000 mg | ORAL_CAPSULE | Freq: Two times a day (BID) | ORAL | Status: DC
Start: 2014-08-23 — End: 2014-08-28

## 2014-08-23 MED ORDER — PANTOPRAZOLE SODIUM 40 MG PO TBEC: 40.0000 mg | DELAYED_RELEASE_TABLET | Freq: Every day | ORAL | Status: DC

## 2014-08-23 MED ORDER — RIVAROXABAN 15 MG PO TABS: 15.0000 mg | ORAL_TABLET | Freq: Two times a day (BID) | ORAL | Status: DC

## 2014-08-23 MED ORDER — OXYCODONE-ACETAMINOPHEN 5-325 MG PO TABS: 1.0000 | ORAL_TABLET | Freq: Four times a day (QID) | ORAL | Status: DC | PRN

## 2014-08-23 MED ORDER — TRAMADOL HCL 50 MG PO TABS: 50.0000 mg | ORAL_TABLET | Freq: Four times a day (QID) | ORAL | Status: DC | PRN

## 2014-08-23 NOTE — Progress Notes (Signed)
Physical Therapy Treatment Patient Details Name: Alexandra [name] MRN: [id number] DOB: 1927-12-07 Today's Date: 08/23/2014    History of Present Illness 81 yo female admitted 08/19/14 with SOB, positive bilateral  PE, plueral effusion and pneumonisa. Hx of LBBB, HTN, falls, peripheral neuropathy, obesity, osteopenia. Pt is from home alone. S/P Laparoscopic cholecystectomy 7/26    PT Comments    Patient was able to  Ambulate 3 times short distances on RA with sats > 91%, patient consented to SNF.  Follow Up Recommendations  SNF;Supervision/Assistance - 24 hour     Equipment Recommendations  None recommended by PT    Recommendations for Other Services       Precautions / Restrictions Precautions Precautions: Fall Precaution Comments: monitor sats    Mobility  Bed Mobility   Bed Mobility: Supine to Sit     Supine to sit: Min guard     General bed mobility comments: able to get self to sitting  Transfers Overall transfer level: Needs assistance Equipment used: Rolling walker (2 wheeled) Transfers: Sit to/from Stand Sit to Stand: Min assist         General transfer comment: verbal cues for hand placement from bed and from [facility].  Ambulation/Gait Ambulation/Gait assistance: Mod assist Ambulation Distance (Feet): 20 Feet (then 30' then 20') Assistive device: Rolling walker (2 wheeled) Gait Pattern/deviations: Step-through pattern;Shuffle;Decreased stride length;Step-to pattern Gait velocity: slow   General Gait Details: 3/4 dyspnea with small amount of activity.. Cue for safety with RW. HR 106, sats 94 %, patient is veryanxious about walking, patient reports that her legs are very weak.   Stairs            Wheelchair Mobility    Modified Rankin (Stroke Patients Only)       Balance                                    Cognition Arousal/Alertness: Awake/alert Behavior During Therapy: Anxious Overall Cognitive Status: Within Functional  Limits for tasks assessed                      Exercises      General Comments        Pertinent Vitals/Pain Pain Score: 4  Pain Location: legs Pain Descriptors / Indicators: Aching;Tender Pain Intervention(s): Limited activity within patient's tolerance;Monitored during session    Home Living                      Prior Function            PT Goals (current goals can now be found in the care plan section) Progress towards PT goals: Progressing toward goals    Frequency  Min 3X/week    PT Plan Current plan remains appropriate    Co-evaluation             End of Session Equipment Utilized During Treatment: Gait belt Activity Tolerance: Patient limited by fatigue Patient left: in chair;with call bell/phone within reach;with nursing/sitter in room;with family/visitor present     Time: [id number] PT Time Calculation (min) (ACUTE ONLY): 24 min  Charges:  $Gait Training: 23-37 mins                    G Codes:      Claretha Cooper 08/23/2014, 1:22 PM [place] PT [phone]

## 2014-08-23 NOTE — Clinical Social Work Note (Signed)
Clinical Social Work Assessment  Patient Details  Name: Alexandra [name] MRN: [id number] Date of Birth: 14-Aug-1927  Date of referral:  08/23/14               Reason for consult:  Discharge Planning                Permission sought to share information with:  Family Supports Permission granted to share information::  Yes, Verbal Permission Granted  Name::     IT sales professional::     Relationship::  dtr  Contact Information:     Housing/Transportation Living arrangements for the past 2 months:  Single Family Home Source of Information:  Adult Children, Patient Patient Interpreter Needed:  None Criminal Activity/Legal Involvement Pertinent to Current Situation/Hospitalization:  No - Comment as needed Significant Relationships:  Adult Children Lives with:  Self Do you feel safe going back to the place where you live?  No Need for family participation in patient care:  Yes (Comment)  Care giving concerns:  Patient lives alone and concern for patient to return since she does not have 24/7 supervision and is deconditioned from hospital stay.   Social Worker assessment / plan:  CSW received referral to assist with DC planning. CSW reviewed chart which stated PT recommended SNF placement at DC. CSW met with patient and family after MD visit. Patient and dtr reports after speaking with MD that they are all agreeable to SNF placement.   CSW provided SNF list and explained process. Patient's husband was at [facility] in the past and would prefer to go there for rehab if possible. CSW spoke with [facility] who can accept patient today. CSW prepared DC packet with FL2, hard scripts and DC summary included.   Patient's family will transport her to facility and RN will call report. RN to provide DC packet to family when ready to leave.  [employer] authorization received #: [id number].  CSW is signing off but available if needed.  Employment status:  Retired Office manager PT Recommendations:  [place] / Referral to community resources:  [place]  Patient/Family's Response to care:  Patient engaged during assessment and reports she is happy that dtr is present to help make decisions.  Patient/Family's Understanding of and Emotional Response to Diagnosis, Current Treatment, and Prognosis:  Patient reports she has been hospitalized twice due to illness. Patient believes with ST SNF that she can regain strength and confidence to return home independently. Patient is happy to DC to [facility] since she is familiar with environment.   Emotional Assessment Appearance:  Appears stated age Attitude/Demeanor/Rapport:  Other (Cooperative) Affect (typically observed):  Appropriate Orientation:  Oriented to Self, Oriented to Place, Oriented to  Time, Oriented to Situation Alcohol / Substance use:  Not Applicable Psych involvement (Current and /or in the community):  No (Comment)  Discharge Needs  Concerns to be addressed:  No discharge needs identified Readmission within the last 30 days:  Yes Current discharge risk:  None Barriers to Discharge:  No Barriers Identified   Boone Master, [place] 08/23/2014, 10:40 AM [phone]

## 2014-08-23 NOTE — Clinical Social Work Placement (Addendum)
   CLINICAL SOCIAL WORK PLACEMENT  NOTE  Date:  08/23/2014  Patient Details  Name: Alexandra [name] MRN: [id number] Date of Birth: 1927/12/17  Clinical Social Work is seeking post-discharge placement for this patient at the [place] level of care (*CSW will initial, date and re-position this form in  chart as items are completed):  Yes   Patient/family provided with [place] Work Department's list of facilities offering this level of care within the geographic area requested by the patient (or if unable, by the patient's family).  Yes   Patient/family informed of their freedom to choose among providers that offer the needed level of care, that participate in Medicare, Medicaid or managed care program needed by the patient, have an available bed and are willing to accept the patient.  Yes   Patient/family informed of [facility]'s ownership interest in [facility] and [facility], as well as of the fact that they are under no obligation to receive care at these facilities.  PASRR submitted to EDS on       PASRR number received on 08/23/14     Existing PASRR number confirmed on       FL2 transmitted to all facilities in geographic area requested by pt/family on 08/23/14     FL2 transmitted to all facilities within larger geographic area on       Patient informed that his/her managed care company has contracts with or will negotiate with certain facilities, including the following:        Yes   Patient/family informed of bed offers received.  Patient chooses bed at [facility]     Physician recommends and patient chooses bed at      Patient to be transferred to [facility] on 08/23/14.  Patient to be transferred to facility by [facility]     Patient family notified on 08/23/14 of transfer.  Name of family member notified:  Dtr-Patti at bedside     PHYSICIAN       Additional Comment:     _______________________________________________ Boone Master, [place] 08/23/2014, 10:52 AM

## 2014-08-23 NOTE — Progress Notes (Signed)
Clinical Social Work  Patient and family decided they would prefer [facility] for transportation and aware of no guarantee of payment. CSW arranged [facility] for 3 pm pick up. [facility] request #: [id number].  CSW is signing off but available if needed.  [place], [place] [phone]

## 2014-08-23 NOTE — Discharge Summary (Addendum)
Physician Discharge Summary  Alexandra [name] [id number] DOB: 1927-08-02 DOA: 08/19/2014  PCP:  Melinda Crutch, MD  Admit date: 08/19/2014 Discharge date: 08/23/2014  Time spent: 35* minutes  Recommendations for Outpatient Follow-up:  1. Follow up with PCP in 3 weeks for follow up on PE. 2. Follow up with cardiology 3-4weeks for heart failure monitoring and fluid balance, EDW as below. 3. Will go to SNF ([facility]) for deconditioning.  BNP    Component Value Date/Time   PROBNP 173.5 12/18/2012 2300   Filed Weights   08/20/14 0500 08/21/14 0500 08/22/14 0712  Weight: 95.2 kg (209 lb 14.1 oz) 91.9 kg (202 lb 9.6 oz) 89.3 kg (196 lb 13.9 oz)     Discharge Diagnoses:  Active Problems:   Essential (primary) hypertension   CKD (chronic kidney disease) stage 3, GFR 30-59 ml/min   Anemia of chronic disease   LBBB (left bundle branch block)   Cardiomyopathy- etiology not yet determined   HCAP (healthcare-associated pneumonia)   Acute on chronic combined systolic and diastolic heart failure   Acute pulmonary embolism   Takotsubo syndrome   Discharge Condition: stable  Diet recommendation: low sodium fluid restricted diet    History of present illness:  Patient has been recently admitted with abdominal pain was found to have acute cholecystitis and undergone laparoscopic cholecystectomy on 26 of July. Patient has suspected Takotsubo cardiomyopathy secondary to recent death of husband. Last echogram showed EF of 20%  Hospital Course:  Acute respiratory failure with hypoxia multifactorial due Acute Pulmonary embolism/acute deompensated HEart failure and HCAP: - CT angio of chest showed new PE, lower et DVT positive.  - started on IV heparin once BP improved and HR improved. - Change anticoagulation to Xarelto  - baseline Cr 1.1= GFR >40 ml/min. - cont coreg and lasix as an outpatient. - will need to follow up with PCP.  Acute chronic systolic heart failure due to takatsubo  syndrome: - Started on IV lasix - Strict I and O's. Once euvolemic change to roal lasix.  HCAP (healthcare-associated pneumonia) - Started on IV antibiotics, once defervesce and Fever resolved, change to oral levaquin, which she will cont for 2 additional doses. - added florastor for 1 month.  CKD (chronic kidney disease) stage 3, GFR 30-59 ml/min - At baseline on D/c.   Anemia of chronic disease - Unchanged.  Essential (primary) hypertension - At goal   Procedures:  CT angi chest  Consultations:  none  Discharge Exam: Filed Vitals:   08/23/14 1224  BP: 109/67  Pulse: 103  Temp: 97.9 F (36.6 C)  Resp: 20    General: A&O x3 Cardiovascular: RRR Respiratory: good air movement CTA B/L  Discharge Instructions      Discharge Instructions    Diet - low sodium heart healthy    Complete by:  As directed      Increase activity slowly    Complete by:  As directed             Medication List    STOP taking these medications        amoxicillin-clavulanate 875-125 MG per tablet  Commonly known as:  AUGMENTIN     doxycycline 100 MG tablet  Commonly known as:  VIBRA-TABS     naproxen sodium 220 MG tablet  Commonly known as:  ANAPROX      TAKE these medications        alendronate 70 MG tablet  Commonly known as:  FOSAMAX  Take 70 mg  by mouth every Saturday. Take with a full glass of water on an empty stomach.     ALPRAZolam 0.25 MG tablet  Commonly known as:  XANAX  TK 1 T PO TID PRN anxiety     aspirin EC 81 MG tablet  Take 81 mg by mouth daily.     b complex vitamins tablet  Take 1 tablet by mouth daily.     CALTRATE 600 PLUS-VIT D PO  Take 1 tablet by mouth 2 (two) times daily.     carvedilol 3.125 MG tablet  Commonly known as:  COREG  Take 1 tablet (3.125 mg total) by mouth 2 (two) times daily with a meal.     cholecalciferol 1000 UNITS tablet  Commonly known as:  VITAMIN D  Take 1,000 Units by mouth daily.     furosemide 20 MG tablet   Commonly known as:  LASIX  Take 1 tablet (20 mg total) by mouth daily.     levofloxacin 750 MG tablet  Commonly known as:  LEVAQUIN  Take 1 tablet (750 mg total) by mouth every other day.     levothyroxine 25 MCG tablet  Commonly known as:  SYNTHROID, LEVOTHROID  Take 25 mcg by mouth daily before breakfast.     multivitamin with minerals Tabs tablet  Take 1 tablet by mouth daily.     nitroGLYCERIN 0.4 MG SL tablet  Commonly known as:  NITROSTAT  Place 0.4 mg under the tongue every 5 (five) minutes as needed for chest pain.     omega-3 acid ethyl esters 1 G capsule  Commonly known as:  LOVAZA  Take 1 g by mouth daily.     oxyCODONE-acetaminophen 5-325 MG per tablet  Commonly known as:  PERCOCET/ROXICET  Take 1 tablet by mouth every 6 (six) hours as needed for severe pain.     pantoprazole 40 MG tablet  Commonly known as:  PROTONIX  Take 1 tablet (40 mg total) by mouth daily.     potassium chloride 10 MEQ tablet  Commonly known as:  K-DUR  Take 1 tablet (10 mEq total) by mouth daily as needed (to take with Lasix).     Rivaroxaban 15 MG Tabs tablet  Commonly known as:  XARELTO  Take 1 tablet (15 mg total) by mouth 2 (two) times daily with a meal.     rivaroxaban 20 MG Tabs tablet  Commonly known as:  XARELTO  Take 1 tablet (20 mg total) by mouth daily with supper.  Start taking on:  09/12/2014     saccharomyces boulardii 250 MG capsule  Commonly known as:  FLORASTOR  Take 1 capsule (250 mg total) by mouth 2 (two) times daily.     traMADol 50 MG tablet  Commonly known as:  ULTRAM  Take 1 tablet (50 mg total) by mouth every 6 (six) hours as needed for moderate pain.     VENTOLIN HFA 108 (90 BASE) MCG/ACT inhaler  Generic drug:  albuterol  INhaLe 2 PufFS by mouth every 4 hours as needed for shortness of breath or wheezing       Allergies  Allergen Reactions  . Cymbalta [Duloxetine Hcl] Other (See Comments)    Depression  . Lyrica [Pregabalin] Other (See  Comments)    Blurry vision   Follow-up Information    Follow up with  Melinda Crutch, MD On 09/19/2014.   Specialty:  Family Medicine   Why:  Please follow up with Dr. Harrington Challenger on Thursday, September 1st at 12:30pm  Contact information:   [place] [address] [phone]       Follow up with Candee Furbish, MD On 09/18/2014.   Specialty:  Cardiology   Why:  Please follow up with Dr. Dorene Ar on Wednesday, August 31st at 3:30pm   Contact information:   [address] [place] [address] [phone]        The results of significant diagnostics from this hospitalization (including imaging, microbiology, ancillary and laboratory) are listed below for reference.    Significant Diagnostic Studies: Dg Chest 2 View  08/19/2014   CLINICAL DATA:  Shortness of breath and weakness  EXAM: CHEST  2 VIEW  COMPARISON:  August 16, 2014  FINDINGS: There is cardiomegaly with pulmonary vascular within normal limits. There is patchy consolidation in both lung bases with small pleural effusions. No adenopathy. There is postoperative change in the left breast region.  IMPRESSION: Suspect a degree of congestive heart failure. There may be superimposed pneumonia in the bases.   Electronically Signed   By: Lowella Grip III M.D.   On: 08/19/2014 15:49   Dg Chest 2 View  08/16/2014   CLINICAL DATA:  Shortness of breath. History of left breast carcinoma  EXAM: CHEST  2 VIEW  COMPARISON:  August 10, 2014  FINDINGS: There remains persistent elevation left hemidiaphragm with atelectasis in the left base are region. There is new opacity in the lateral right base, felt to represent a small focus of pneumonia. Upper and mid lung zones are clear. Heart is mildly enlarged but stable. The pulmonary vascularity is normal. No adenopathy. Surgical clips in left breast region are again noted. There is degenerative change in the thoracic spine.  IMPRESSION: New infiltrate lateral right base.  Persistent elevation left hemidiaphragm with left base atelectatic change. Stable cardiac prominence.   Electronically Signed   By: Lowella Grip III M.D.   On: 08/16/2014 10:58   Dg Chest 2 View  08/10/2014   CLINICAL DATA:  Intermittent shortness of breath  EXAM: CHEST  2 VIEW  COMPARISON:  April 27, 2013  FINDINGS: There is persistent elevation the left hemidiaphragm. There is patchy left base atelectasis. There is no appreciable edema or consolidation. The heart is slightly enlarged but stable. The pulmonary vascularity is normal. No adenopathy. There are surgical clips in the left breast region. There is degenerative change in thoracic spine.  IMPRESSION: Stable elevation left hemidiaphragm with left base atelectasis. No edema or consolidation. Heart prominent but stable. Pulmonary vascularity within normal limits.   Electronically Signed   By: Lowella Grip III M.D.   On: 08/10/2014 20:24   Ct Angio Chest Pe W/cm &/or Wo Cm  08/19/2014   CLINICAL DATA:  Progressive shortness of breath following a cholecystectomy 1 week ago.  EXAM: CT ANGIOGRAPHY CHEST WITH CONTRAST  TECHNIQUE: Multidetector CT imaging of the chest was performed using the standard protocol during bolus administration of intravenous contrast. Multiplanar CT image reconstructions and MIPs were obtained to evaluate the vascular anatomy.  CONTRAST:  27mL OMNIPAQUE IOHEXOL 350 MG/ML SOLN  COMPARISON:  Chest radiographs obtained earlier today. Chest CT dated 09/19/2003.  FINDINGS: Diffusely enlarged heart. Small to moderate-sized bilateral pleural effusions, larger on the right. Adjacent bilateral lower lobe atelectasis. Mildly prominent interstitial markings. Mild bullous changes.  Small number of small to moderate-sized pulmonary arterial filling defects bilaterally. The right ventricular 2 left ventricular ratio is difficult to determine due to lack of opacification of the left ventricle. The  ratio is approximately 1.2.  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 Thoracic spine degenerative changes are noted. No lung nodules or enlarged lymph nodes.  Review of the MIP images confirms the above findings.  IMPRESSION: 1. Multiple small to moderate-sized bilateral pulmonary emboli. 2. CT evidence of right heart strain (RV/LV Ratio = 1.2) consistent with at least submassive (intermediate risk) PE. The presence of right heart strain has been associated with an increased risk of morbidity and mortality. Please activate Code PE by paging [phone]. 3. Small to moderate-sized bilateral pleural effusions, larger on the right. 4. Bilateral lower lobe atelectasis. 5. Mild changes of COPD with mild superimposed interstitial pulmonary edema. Critical Value/emergent results were called by telephone at the time of interpretation on 08/19/2014 at 8:49 pm to Dr. Toy Baker , who verbally acknowledged these results.   Electronically Signed   By: Claudie Revering M.D.   On: 08/19/2014 20:58   Nm Hepatobiliary Liver Func  08/11/2014   CLINICAL DATA:  Predominantly lower abdominal pain and nausea for 3 weeks. No vomiting. Possible acute cholecystitis.  EXAM: NUCLEAR MEDICINE HEPATOBILIARY IMAGING  TECHNIQUE: Sequential images of the abdomen were obtained out to 60 minutes following intravenous administration of radiopharmaceutical.  RADIOPHARMACEUTICALS:  5.0 mCi Tc-37m  Choletec IV  COMPARISON:  Right upper quadrant ultrasound 08/10/2014  FINDINGS: There is prompt radiotracer uptake by the liver with excretion into the biliary system. Gallbladder activity is identified by 20 minutes with progressive accumulation. Radiotracer excretion into the small bowel is identified during the second hour of imaging.  IMPRESSION: Patent cystic duct without evidence of acute cholecystitis.   Electronically Signed   By: Logan Bores   On: 08/11/2014 11:49   Ct Abdomen Pelvis W Contrast  08/10/2014   CLINICAL DATA:  Subacute onset of mid and upper  abdominal pain for 2 weeks. Throat burning. Nausea. Decreased appetite and oral intake. Initial encounter.  EXAM: CT ABDOMEN AND PELVIS WITH CONTRAST  TECHNIQUE: Multidetector CT imaging of the abdomen and pelvis was performed using the standard protocol following bolus administration of intravenous contrast.  CONTRAST:  146mL OMNIPAQUE IOHEXOL 300 MG/ML  SOLN  COMPARISON:  CT of the pelvis performed 02/19/2014, and pelvic ultrasound performed 10/14/2003  FINDINGS: Small bilateral pleural effusions are noted. The heart is mildly enlarged. Scattered coronary artery calcifications are seen.  Pericholecystic fluid is noted, and a stone is noted within the gallbladder. This raises question for mild acute cholecystitis. Would correlate for associated symptoms. A 1.2 cm cystic focus within the left hepatic lobe is of uncertain significance. Trace ascites is noted tracking about the inferior tip of the liver.  The visualized portions of the spleen are grossly unremarkable. The pancreas demonstrates minimal surrounding soft tissue stranding, though this remains nonspecific. There is mild stranding within the small bowel mesentery, with scattered associated borderline prominent nodes. The adrenal glands are unremarkable in appearance.  Mild calcification adjacent to the left adrenal gland may be vascular in nature.  Mild bilateral renal atrophy is noted. Nonspecific perinephric stranding is noted bilaterally. A few scattered small bilateral renal cysts are seen. There is no evidence of hydronephrosis. No renal or ureteral stones are identified.  The small bowel is unremarkable in appearance. The stomach is within normal limits. No acute vascular abnormalities are seen. Scattered calcification is noted along the abdominal aorta.  The appendix is normal in caliber, without evidence of appendicitis. Trace fluid is noted along the paracolic gutters bilaterally, more prominent on the  right. Minimal diverticulosis is noted at the  proximal sigmoid colon. The colon is otherwise unremarkable.  The bladder is decompressed and not well seen. A small amount of free fluid is noted within the pelvis. The uterus is grossly unremarkable in appearance. The ovaries are relatively symmetric. No suspicious adnexal masses are seen. No inguinal lymphadenopathy is seen.  No acute osseous abnormalities are identified. There is mild chronic loss of height at the superior endplate of vertebral body L4. The patient is status post decompression at L3 and L4.  IMPRESSION: 1. Pericholecystic fluid noted, with cholelithiasis. Though this is nonspecific given underlying trace ascites, it raises question for mild acute cholecystitis. Would correlate for associated symptoms. 2. Nonspecific minimal soft tissue stranding about the pancreas, without definite evidence for pancreatitis. Mild stranding is also noted within the adjacent small bowel mesentery, with associated borderline prominent nodes. This may reflect the patient's baseline. 3. Trace ascites noted about the liver and within the pelvis. 4. Small bilateral pleural effusions noted. 5. Mild cardiomegaly. Scattered coronary artery calcifications seen. 6. Nonspecific 1.2 cm cystic focus within the left hepatic lobe. 7. Mild bilateral renal atrophy noted. Scattered bilateral renal cysts seen. 8. Scattered calcification along the abdominal aorta. 9. Minimal diverticulosis of the proximal sigmoid colon, without evidence of diverticulitis. 10. Mild chronic loss of height at the superior endplate of vertebral body L4.   Electronically Signed   By: Garald Balding M.D.   On: 08/10/2014 22:30   US Abdomen Limited Ruq  08/11/2014   CLINICAL DATA:  81 year old female with right upper quadrant abdominal pain  EXAM: US ABDOMEN LIMITED - RIGHT UPPER QUADRANT  COMPARISON:  CT dated 08/10/2014  FINDINGS: Gallbladder:  There is a 1.2 cm stone in the gallbladder. There is thickening of the anterior gallbladder wall measuring up  to 1 cm. No significant pericholecystic fluid. No tenderness was elicited over the gallbladder area during scanning.  Common bile duct:  Diameter: 4 mm  Liver:  No focal lesion identified. Within normal limits in parenchymal echogenicity.  A right-sided pleural effusion is partially visualized.  IMPRESSION: Cholelithiasis with thickened anterior gallbladder wall. Findings may represent acute cholecystitis. A hepatobiliary scintigraphy may provide better evaluation of the gallbladder if an acute cholecystitis is clinically suspected.   Electronically Signed   By: Anner Crete M.D.   On: 08/11/2014 00:05    Microbiology: Recent Results (from the past 240 hour(s))  Blood culture (routine x 2)     Status: None (Preliminary result)   Collection Time: 08/19/14  3:22 PM  Result Value Ref Range Status   Specimen Description BLOOD RIGHT ANTECUBITAL  Final   Special Requests BOTTLES DRAWN AEROBIC AND ANAEROBIC 5CC  Final   Culture   Final    NO GROWTH 3 DAYS Performed at [facility]    Report Status PENDING  Incomplete  Blood culture (routine x 2)     Status: None (Preliminary result)   Collection Time: 08/19/14  3:50 PM  Result Value Ref Range Status   Specimen Description BLOOD RIGHT HAND  Final   Special Requests BOTTLES DRAWN AEROBIC AND ANAEROBIC 3ML  Final   Culture   Final    NO GROWTH 3 DAYS Performed at [facility]    Report Status PENDING  Incomplete  Urine culture     Status: None   Collection Time: 08/19/14  5:47 PM  Result Value Ref Range Status   Specimen Description URINE, RANDOM  Final   Special Requests Normal  Final   Culture   Final    20,000 COLONIES/mL YEAST Performed at [facility]    Report Status 08/22/2014 FINAL  Final     Labs: Basic Metabolic Panel:  Recent Labs Lab 08/20/14 0623 08/21/14 0816 08/22/14 0518 08/22/14 1540 08/23/14 0524  NA 137 138 140 138 140  K 4.2 4.4 3.7 4.0 3.9  CL 105 104 99* 100* 100*  CO2 26 26 31 30  29   GLUCOSE 103* 92 98 111* 97  BUN 24* 22* 22* 23* 19  CREATININE 1.16* 1.33* 1.60* 1.53* 1.29*  CALCIUM 8.2* 8.6* 8.7* 8.3* 8.8*  MG 1.4*  --   --   --   --   PHOS 4.5  --   --   --   --    Liver Function Tests:  Recent Labs Lab 08/17/14 0450 08/19/14 2120 08/20/14 0623  AST 25 24 22   ALT 22 20 18   ALKPHOS 43 39 36*  BILITOT 0.6 0.8 0.8  PROT 6.4* 6.5 6.0*  ALBUMIN 3.4* 3.4* 3.1*   No results for input(s): LIPASE, AMYLASE in the last 168 hours. No results for input(s): AMMONIA in the last 168 hours. CBC:  Recent Labs Lab 08/19/14 1454 08/20/14 0623 08/21/14 0508 08/22/14 0518 08/23/14 0524  WBC 8.4 8.4 7.6 8.3 7.3  HGB 11.0* 10.3* 10.2* 10.1* 10.3*  HCT 35.1* 32.1* 31.8* 32.2* 33.2*  MCV 90.9 90.9 91.1 90.4 91.5  PLT 171 156 159 160 165   Cardiac Enzymes:  Recent Labs Lab 08/19/14 2301 08/20/14 0623 08/20/14 1225  TROPONINI 0.03 0.03 0.03   BNP: BNP (last 3 results)  Recent Labs  08/16/14 0855 08/19/14 1523  BNP 1169.9* 1627.6*    ProBNP (last 3 results) No results for input(s): PROBNP in the last 8760 hours.  CBG:  Recent Labs Lab 08/19/14 1528 08/22/14 1659  GLUCAP 109* 105*       Signed:  FELIZ ORTIZ, ABRAHAM  [facility] 08/23/2014, 1:00 PM

## 2014-08-23 NOTE — Discharge Instructions (Signed)
Information on my medicine - XARELTO (rivaroxaban)  This medication education was reviewed with me or my healthcare representative as part of my discharge preparation.  The pharmacist that spoke with me during my hospital stay was:  Rudean Haskell, [place]? Xarelto was prescribed to treat blood clots that may have been found in the veins of your legs (deep vein thrombosis) or in your lungs (pulmonary embolism) and to reduce the risk of them occurring again.  What do you need to know about Xarelto? The starting dose is one 15 mg tablet taken TWICE daily with food for the FIRST 21 DAYS then on 09/13/2014  the dose is changed to one 20 mg tablet taken ONCE A DAY with your evening meal.  DO NOT stop taking Xarelto without talking to the health care provider who prescribed the medication.  Refill your prescription for 20 mg tablets before you run out.  After discharge, you should have regular check-up appointments with your healthcare provider that is prescribing your Xarelto.  In the future your dose may need to be changed if your kidney function changes by a significant amount.  What do you do if you miss a dose? If you are taking Xarelto TWICE DAILY and you miss a dose, take it as soon as you remember. You may take two 15 mg tablets (total 30 mg) at the same time then resume your regularly scheduled 15 mg twice daily the next day.  If you are taking Xarelto ONCE DAILY and you miss a dose, take it as soon as you remember on the same day then continue your regularly scheduled once daily regimen the next day. Do not take two doses of Xarelto at the same time.   Important Safety Information Xarelto is a blood thinner medicine that can cause bleeding. You should call your healthcare provider right away if you experience any of the following: ? Bleeding from an injury or your nose that does not stop. ? Unusual colored urine (red or dark brown) or unusual  colored stools (red or black). ? Unusual bruising for unknown reasons. ? A serious fall or if you hit your head (even if there is no bleeding).  Some medicines may interact with Xarelto and might increase your risk of bleeding while on Xarelto. To help avoid this, consult your healthcare provider or pharmacist prior to using any new prescription or non-prescription medications, including herbals, vitamins, non-steroidal anti-inflammatory drugs (NSAIDs) and supplements.  This website has more information on Xarelto: [email or web address]

## 2014-08-24 LAB — CULTURE, BLOOD (ROUTINE X 2)
Culture: NO GROWTH
Culture: NO GROWTH

## 2014-08-26 ENCOUNTER — Encounter: Payer: Commercial Managed Care - HMO | Admitting: Neurology

## 2014-08-26 ENCOUNTER — Non-Acute Institutional Stay (SKILLED_NURSING_FACILITY): Payer: Commercial Managed Care - HMO | Admitting: Adult Health

## 2014-08-26 ENCOUNTER — Encounter: Payer: Self-pay | Admitting: Adult Health

## 2014-08-26 DIAGNOSIS — E038 Other specified hypothyroidism: Secondary | ICD-10-CM | POA: Diagnosis not present

## 2014-08-26 DIAGNOSIS — I10 Essential (primary) hypertension: Secondary | ICD-10-CM

## 2014-08-26 DIAGNOSIS — K219 Gastro-esophageal reflux disease without esophagitis: Secondary | ICD-10-CM | POA: Diagnosis not present

## 2014-08-26 DIAGNOSIS — I5043 Acute on chronic combined systolic (congestive) and diastolic (congestive) heart failure: Secondary | ICD-10-CM | POA: Diagnosis not present

## 2014-08-26 DIAGNOSIS — R5381 Other malaise: Secondary | ICD-10-CM

## 2014-08-26 DIAGNOSIS — I471 Supraventricular tachycardia, unspecified: Secondary | ICD-10-CM

## 2014-08-26 DIAGNOSIS — F419 Anxiety disorder, unspecified: Secondary | ICD-10-CM | POA: Diagnosis not present

## 2014-08-26 DIAGNOSIS — E46 Unspecified protein-calorie malnutrition: Secondary | ICD-10-CM

## 2014-08-26 DIAGNOSIS — D638 Anemia in other chronic diseases classified elsewhere: Secondary | ICD-10-CM | POA: Diagnosis not present

## 2014-08-26 DIAGNOSIS — I2699 Other pulmonary embolism without acute cor pulmonale: Secondary | ICD-10-CM

## 2014-08-26 DIAGNOSIS — N183 Chronic kidney disease, stage 3 unspecified: Secondary | ICD-10-CM

## 2014-08-26 DIAGNOSIS — J189 Pneumonia, unspecified organism: Secondary | ICD-10-CM

## 2014-08-27 LAB — CBC AND DIFFERENTIAL
HCT: 29 % — AB (ref 36–46)
Hemoglobin: 9.3 g/dL — AB (ref 12.0–16.0)
Platelets: 170 10*3/uL (ref 150–399)
WBC: 8.9 10^3/mL

## 2014-08-28 ENCOUNTER — Non-Acute Institutional Stay (SKILLED_NURSING_FACILITY): Payer: Commercial Managed Care - HMO | Admitting: Internal Medicine

## 2014-08-28 ENCOUNTER — Encounter: Payer: Self-pay | Admitting: Internal Medicine

## 2014-08-28 DIAGNOSIS — I5043 Acute on chronic combined systolic (congestive) and diastolic (congestive) heart failure: Secondary | ICD-10-CM

## 2014-08-28 DIAGNOSIS — E038 Other specified hypothyroidism: Secondary | ICD-10-CM

## 2014-08-28 DIAGNOSIS — N183 Chronic kidney disease, stage 3 unspecified: Secondary | ICD-10-CM

## 2014-08-28 DIAGNOSIS — G4700 Insomnia, unspecified: Secondary | ICD-10-CM | POA: Diagnosis not present

## 2014-08-28 DIAGNOSIS — I429 Cardiomyopathy, unspecified: Secondary | ICD-10-CM | POA: Diagnosis not present

## 2014-08-28 DIAGNOSIS — J9601 Acute respiratory failure with hypoxia: Secondary | ICD-10-CM

## 2014-08-28 DIAGNOSIS — K219 Gastro-esophageal reflux disease without esophagitis: Secondary | ICD-10-CM

## 2014-08-28 DIAGNOSIS — D638 Anemia in other chronic diseases classified elsewhere: Secondary | ICD-10-CM | POA: Diagnosis not present

## 2014-08-28 DIAGNOSIS — J189 Pneumonia, unspecified organism: Secondary | ICD-10-CM | POA: Diagnosis not present

## 2014-08-28 DIAGNOSIS — E039 Hypothyroidism, unspecified: Secondary | ICD-10-CM

## 2014-08-28 DIAGNOSIS — R5381 Other malaise: Secondary | ICD-10-CM

## 2014-08-28 DIAGNOSIS — E46 Unspecified protein-calorie malnutrition: Secondary | ICD-10-CM

## 2014-08-28 DIAGNOSIS — I2699 Other pulmonary embolism without acute cor pulmonale: Secondary | ICD-10-CM

## 2014-08-28 NOTE — Progress Notes (Signed)
Patient ID: Alexandra [name], female   DOB: 1928-01-03, 81 y.o.   MRN: [id number]      [facility]  PCP:  Melinda Crutch, MD  Code Status: Full Code  Allergies  Allergen Reactions  . Cymbalta [Duloxetine Hcl] Other (See Comments)    Depression  . Lyrica [Pregabalin] Other (See Comments)    Blurry vision    Chief Complaint  Patient presents with  . New Admit To SNF    New Admit from Hospital     HPI:  81 y.o. patient is here for short term rehabilitation post hospital admission from 08/19/14-08/23/14 with acute respiratory failure from chf exacerbation, HCAP and PE. She responded well to iv antibiotics, diuresis and anticoagulation. She is s/p lap cholecystectomy 08/13/14 and suspected Takotsubo cardiomyopathy. She is seen in her room today. Her daughter is also present. She complaints of being tired now post therapy. She has some dyspnea with exertion and is on o2. She is unable to sleep at night. She also complaints of dryness in her nose and mouth. Her daughter would like her follow up appointment with cardiology Dr Candee Furbish scheduled.   Review of Systems:  Constitutional: Negative for fever, chills, diaphoresis.  HENT: Negative for headache, congestion, nasal discharge Eyes: Negative for eye pain, blurred vision, double vision and discharge.  Respiratory: Negative for cough, wheezing.   Cardiovascular: Negative for chest pain, palpitations, leg swelling.  Gastrointestinal: Negative for heartburn, nausea, vomiting, abdominal pain. Bowel movement today Genitourinary: Negative for dysuria and flank pain.  Musculoskeletal: Negative for back pain, falls Skin: Negative for itching, rash.  Neurological: positive for dizziness with sudden position change and weakness. Negative for tingling, focal weakness Psychiatric/Behavioral: Negative for depression    Past Medical History  Diagnosis Date  . Arthritis   . Headache(784.0)   . Anxiety   . Depression   . Asthma   .  CAP (community acquired pneumonia) 12/19/2012  . Intermittent LBBB (left bundle branch block) 12/21/2012  . Obesity   . Peripheral neuropathy   . Sacroiliitis, not elsewhere classified   . Osteopenia   . Hypercholesteremia   . Neuropathy   . Trigger finger     [name]  . Chest pain     NM stress test 05/2011 Normal  . DOE (dyspnea on exertion)     No SOB at rest  . Fatigue     excertional  . breast ca dx'd 2000    left  . Essential (primary) hypertension 04/02/2014  . Adult hypothyroidism 04/02/2014  . Hereditary and idiopathic peripheral neuropathy 07/02/2014  . Leg weakness   . CKD (chronic kidney disease) stage 3, GFR 30-59 ml/min 08/13/2014  . Anemia of chronic disease 08/13/2014  . CHF (congestive heart failure) 08/19/2014   Past Surgical History  Procedure Laterality Date  . Leg surgery    . Breast lumpectomy      breast cancer  . Lumbar laminectomy    . Achilles tendon surgery    . Tonsillectomy    . Vesicovaginal fistula closure w/ tah      DC hysterectomy  . Polypectomy    . Cholecystectomy N/A 08/13/2014    Procedure: LAPAROSCOPIC CHOLECYSTECTOMY;  Surgeon: Stark Klein, MD;  Location: [facility];  Service: General;  Laterality: N/A;   Social History:   reports that she has never smoked. She has never used smokeless tobacco. She reports that she does not drink alcohol or use illicit drugs.  Family History  Problem Relation Age  of Onset  . Pneumonia Mother 32    cause of death  . Diabetes Brother   . Alzheimer's disease Sister   . CVA Daughter     01/21/09  . Pancreatic cancer Father   . Pneumonia Mother     Medications:   Medication List       This list is accurate as of: 08/28/14  2:23 PM.  Always use your most recent med list.               alendronate 70 MG tablet  Commonly known as:  FOSAMAX  Take 70 mg by mouth every Saturday. Take with a full glass of water on an empty stomach.     ALPRAZolam 0.25 MG tablet  Commonly known as:  XANAX  TK 1 T  PO TID PRN anxiety     aspirin EC 81 MG tablet  Take 81 mg by mouth daily.     b complex vitamins tablet  Take 1 tablet by mouth daily.     CALTRATE 600 PLUS-VIT D PO  Take 1 tablet by mouth 2 (two) times daily.     carvedilol 3.125 MG tablet  Commonly known as:  COREG  Take 1 tablet (3.125 mg total) by mouth 2 (two) times daily with a meal.     cholecalciferol 1000 UNITS tablet  Commonly known as:  VITAMIN D  Take 1,000 Units by mouth daily.     furosemide 20 MG tablet  Commonly known as:  LASIX  Take 1 tablet (20 mg total) by mouth daily.     levothyroxine 25 MCG tablet  Commonly known as:  SYNTHROID, LEVOTHROID  Take one tablet by mouth once daily in the morning before breakfast for thyroid     multivitamin with minerals Tabs tablet  Take 1 tablet by mouth daily.     nitroGLYCERIN 0.4 MG SL tablet  Commonly known as:  NITROSTAT  Place 0.4 mg under the tongue every 5 (five) minutes as needed for chest pain.     oxyCODONE-acetaminophen 5-325 MG per tablet  Commonly known as:  PERCOCET/ROXICET  Take 1 tablet by mouth every 6 (six) hours as needed for severe pain.     pantoprazole 40 MG tablet  Commonly known as:  PROTONIX  Take 1 tablet (40 mg total) by mouth daily.     potassium chloride 10 MEQ tablet  Commonly known as:  K-DUR  Take 1 tablet (10 mEq total) by mouth daily as needed (to take with Lasix).     promethazine 25 MG tablet  Commonly known as:  PHENERGAN  Take one tablet by mouth every 8 hours as needed for nausea     Rivaroxaban 15 MG Tabs tablet  Commonly known as:  XARELTO  Take 1 tablet (15 mg total) by mouth 2 (two) times daily with a meal.     rivaroxaban 20 MG Tabs tablet  Commonly known as:  XARELTO  Take 1 tablet (20 mg total) by mouth daily with supper.  Start taking on:  09/12/2014     traMADol 50 MG tablet  Commonly known as:  ULTRAM  Take 1 tablet (50 mg total) by mouth every 6 (six) hours as needed for moderate pain.     VENTOLIN  HFA 108 (90 BASE) MCG/ACT inhaler  Generic drug:  albuterol  INhaLe 2 PufFS by mouth every 4 hours as needed for shortness of breath or wheezing         Physical Exam: Filed Vitals:  08/28/14 1405  BP: 104/56  Pulse: 96  Temp: 99 F (37.2 C)  TempSrc: Oral  Resp: 20  Height: 5\' 4"  (1.626 m)  Weight: 197 lb (89.359 kg)  SpO2: 98%    General- elderly female, obese, in no acute distress Head- normocephalic, atraumatic Throat- moist mucus membrane Eyes- PERRLA, EOMI, no pallor, no icterus, no discharge, normal conjunctiva, normal sclera Neck- no cervical lymphadenopathy Cardiovascular- normal s1,s2, no murmurs, palpable dorsalis pedis and radial pulses, trace leg edema Respiratory- bilateral clear to auscultation, no wheeze, no rhonchi, no crackles, no use of accessory muscles Abdomen- bowel sounds present, soft, non tender Musculoskeletal- able to move all 4 extremities, generalized weakness, on a recliner Neurological- no focal deficit, alert and oriented  Skin- warm and dry Psychiatry- normal mood and affect    Labs reviewed: Basic Metabolic Panel:  Recent Labs  08/16/14 0855  08/20/14 0623  08/22/14 0518 08/22/14 1540 08/23/14 0524  NA  --   < > 137  < > 140 138 140  K  --   < > 4.2  < > 3.7 4.0 3.9  CL  --   < > 105  < > 99* 100* 100*  CO2  --   < > 26  < > 31 30 29   GLUCOSE  --   < > 103*  < > 98 111* 97  BUN  --   < > 24*  < > 22* 23* 19  CREATININE  --   < > 1.16*  < > 1.60* 1.53* 1.29*  CALCIUM  --   < > 8.2*  < > 8.7* 8.3* 8.8*  MG 1.8  --  1.4*  --   --   --   --   PHOS  --   --  4.5  --   --   --   --   < > = values in this interval not displayed. Liver Function Tests:  Recent Labs  08/17/14 0450 08/19/14 2120 08/20/14 0623  AST 25 24 22   ALT 22 20 18   ALKPHOS 43 39 36*  BILITOT 0.6 0.8 0.8  PROT 6.4* 6.5 6.0*  ALBUMIN 3.4* 3.4* 3.1*    Recent Labs  08/10/14 2024  LIPASE 14*   No results for input(s): AMMONIA in the last 8760  hours. CBC:  Recent Labs  02/19/14 1231  08/10/14 2024  08/21/14 0508 08/22/14 0518 08/23/14 0524 08/27/14  WBC 12.0*  < > 6.7  < > 7.6 8.3 7.3 8.9  NEUTROABS 8.9*  --  4.2  --   --   --   --   --   HGB 13.8  < > 11.2*  < > 10.2* 10.1* 10.3* 9.3*  HCT 41.2  < > 34.9*  < > 31.8* 32.2* 33.2* 29*  MCV 95.8  < > 91.8  < > 91.1 90.4 91.5  --   PLT 186  < > 193  < > 159 160 165 170  < > = values in this interval not displayed. Cardiac Enzymes:  Recent Labs  08/19/14 2301 08/20/14 0623 08/20/14 1225  TROPONINI 0.03 0.03 0.03   BNP: Invalid input(s): POCBNP CBG:  Recent Labs  08/19/14 1528 08/22/14 1659  GLUCAP 109* 105*    Radiological Exams: Dg Chest 2 View  08/19/2014   CLINICAL DATA:  Shortness of breath and weakness  EXAM: CHEST  2 VIEW  COMPARISON:  August 16, 2014  FINDINGS: There is cardiomegaly with pulmonary vascular within normal limits. There  is patchy consolidation in both lung bases with small pleural effusions. No adenopathy. There is postoperative change in the left breast region.  IMPRESSION: Suspect a degree of congestive heart failure. There may be superimposed pneumonia in the bases.   Electronically Signed   By: Lowella Grip III M.D.   On: 08/19/2014 15:49   Ct Angio Chest Pe W/cm &/or Wo Cm  08/19/2014   CLINICAL DATA:  Progressive shortness of breath following a cholecystectomy 1 week ago.  EXAM: CT ANGIOGRAPHY CHEST WITH CONTRAST  TECHNIQUE: Multidetector CT imaging of the chest was performed using the standard protocol during bolus administration of intravenous contrast. Multiplanar CT image reconstructions and MIPs were obtained to evaluate the vascular anatomy.  CONTRAST:  65mL OMNIPAQUE IOHEXOL 350 MG/ML SOLN  COMPARISON:  Chest radiographs obtained earlier today. Chest CT dated 09/19/2003.  FINDINGS: Diffusely enlarged heart. Small to moderate-sized bilateral pleural effusions, larger on the right. Adjacent bilateral lower lobe atelectasis. Mildly  prominent interstitial markings. Mild bullous changes.  Small number of small to moderate-sized pulmonary arterial filling defects bilaterally. The right ventricular 2 left ventricular ratio is difficult to determine due to lack of opacification of the left ventricle. The ratio is approximately 1.2.  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 Thoracic spine degenerative changes are noted. No lung nodules or enlarged lymph nodes.  Review of the MIP images confirms the above findings.  IMPRESSION: 1. Multiple small to moderate-sized bilateral pulmonary emboli. 2. CT evidence of right heart strain (RV/LV Ratio = 1.2) consistent with at least submassive (intermediate risk) PE. The presence of right heart strain has been associated with an increased risk of morbidity and mortality. Please activate Code PE by paging [phone]. 3. Small to moderate-sized bilateral pleural effusions, larger on the right. 4. Bilateral lower lobe atelectasis. 5. Mild changes of COPD with mild superimposed interstitial pulmonary edema. Critical Value/emergent results were called by telephone at the time of interpretation on 08/19/2014 at 8:49 pm to Dr. Toy Baker , who verbally acknowledged these results.   Electronically Signed   By: Claudie Revering M.D.   On: 08/19/2014 20:58     Assessment/Plan  Physical deconditioning Will have her work with physical therapy and occupational therapy team to help with gait training and muscle strengthening exercises.fall precautions. Skin care. Encourage to be out of bed.   Acute pulmonary embolism Currently on o2, breathing stable at rest. Continue xarelto 15 mg bid until 09/11/14 and then 20 mg daily and monitor  Acute respiratory failure With o2 at present. Stable PE, completed levaquin course and is euvolemic on exam. Wean off o2 as tolerated to keep o2 sat > 90% with exertion. Encouraged pulmonary toileting. On prn ventolin  HCAP Clinically improved, complete her  antibiotic course, monitor  Chronic combined systolic and diastolic CHF Monitor weight. continue coreg 3.125 mg bid, Lasix 20 mg daily with kcl supplement. Cardiology appointment with Dr. Marlou Porch on 09/18/14  Insomnia Start melatonin 3 mg qhs at 10: 30 pm and reassess  Anemia likely multifactorial with further drop in hb. Has ckd, recent infection and PE and is on anticoagulation. monitor cbc in 1 week  CMP Thought to be from Takatsubo cardiomyopathy. Continue aspirin, coreg 3.125 mg bid, lasix 20 mg daily. Pending cardiology f/u appointment  CKD stage III  creatinine 1.29. Monitor bmp  Hypothyroidism  Stable, continue Synthroid 25 mcg daily  GERD  Stable, continue Protonix 40 mg daily  Protein-calorie malnutrition  Monitor weight, continue supplements with ca-vit  d and b complex    Goals of care: short term rehabilitation   Labs/tests ordered: cbc, bmp  Family/ staff Communication: reviewed care plan with patient and nursing supervisor    Blanchie Serve, MD  [facility] [phone] (Monday-Friday 8 am - 5 pm) [phone] (afterhours)

## 2014-08-28 NOTE — Progress Notes (Addendum)
Patient ID: Alexandra [name], female   DOB: November 22, 1927, 81 y.o.   MRN: [id number] Physical deconditioning, PSVT, PE, Combined systolic and diastolic HF, HCAP, Anemia Hypertension, Anxiety, Hypothyroidism, GERD and Protein-calorie malnutrition     DATE:  08/26/14 MRN:  [id number]  BIRTHDAY: 1927-08-21  Facility:  Nursing Home Location:  Agency Room Number: 908-P  LEVEL OF CARE:  SNF (31)  Contact Information    Name [place] Son [phone]  [place] Relative [phone]  [phone]   Bubolz,Patti Daughter   [phone]       Chief Complaint  Patient presents with  . Hospitalization Follow-up    Physical deconditioning, PSVT, PE, Combined systolic and diastolic HF, HCAP, Anemia Hypertension, Anxiety, Hypothyroidism, GERD and Protein-calorie malnutrition    HISTORY OF PRESENT ILLNESS:  This is an 81 year old female who has been admitted to [facility] on 08/23/14 from [facility]. She went to the hospital with abdominal pain and was found to have cholecystitis. She was suspected Takotsubo cardiomyopathy secondary to recent death of husband. Last echocardiogram showed EF 20%. Hospital stay was complicated by SVT and Coreg was adjusted. CT showed new PE. She was started on Xarelto. On discharge, she was found to have a new right base infiltrate.  She has been admitted for a short-term rehabilitation.  PAST MEDICAL HISTORY:  Past Medical History  Diagnosis Date  . Arthritis   . Headache(784.0)   . Anxiety   . Depression   . Asthma   . CAP (community acquired pneumonia) 12/19/2012  . Intermittent LBBB (left bundle branch block) 12/21/2012  . Obesity   . Peripheral neuropathy   . Sacroiliitis, not elsewhere classified   . Osteopenia   . Hypercholesteremia   . Neuropathy   . Trigger finger     [name]  . Chest pain     NM stress test 05/2011 Normal  . DOE (dyspnea on  exertion)     No SOB at rest  . Fatigue     excertional  . breast ca dx'd 2000    left  . Essential (primary) hypertension 04/02/2014  . Adult hypothyroidism 04/02/2014  . Hereditary and idiopathic peripheral neuropathy 07/02/2014  . Leg weakness   . CKD (chronic kidney disease) stage 3, GFR 30-59 ml/min 08/13/2014  . Anemia of chronic disease 08/13/2014  . CHF (congestive heart failure) 08/19/2014     CURRENT MEDICATIONS: Reviewed  Patient's Medications  New Prescriptions   No medications on file  Previous Medications   ALENDRONATE (FOSAMAX) 70 MG TABLET    Take 70 mg by mouth every Saturday. Take with a full glass of water on an empty stomach.   ALPRAZOLAM (XANAX) 0.25 MG TABLET    TK 1 T PO TID PRN anxiety   ASPIRIN EC 81 MG TABLET    Take 81 mg by mouth daily.   B COMPLEX VITAMINS TABLET    Take 1 tablet by mouth daily.   CALCIUM-VITAMIN D (CALTRATE 600 PLUS-VIT D PO)    Take 1 tablet by mouth 2 (two) times daily.   CARVEDILOL (COREG) 3.125 MG TABLET    Take 1 tablet (3.125 mg total) by mouth 2 (two) times daily with a meal.   CHOLECALCIFEROL (VITAMIN D) 1000 UNITS TABLET    Take 1,000 Units by mouth daily.   FUROSEMIDE (LASIX) 20 MG TABLET    Take 1 tablet (20 mg total) by mouth daily.  LEVOFLOXACIN (LEVAQUIN) 750 MG TABLET    Take 1 tablet (750 mg total) by mouth every other day.   LEVOTHYROXINE (SYNTHROID, LEVOTHROID) 25 MCG TABLET    Take 25 mcg by mouth daily before breakfast.   MULTIPLE VITAMIN (MULITIVITAMIN WITH MINERALS) TABS    Take 1 tablet by mouth daily.   NITROGLYCERIN (NITROSTAT) 0.4 MG SL TABLET    Place 0.4 mg under the tongue every 5 (five) minutes as needed for chest pain.   OMEGA-3 ACID ETHYL ESTERS (LOVAZA) 1 G CAPSULE    Take 1 g by mouth daily.   OXYCODONE-ACETAMINOPHEN (PERCOCET/ROXICET) 5-325 MG PER TABLET    Take 1 tablet by mouth every 6 (six) hours as needed for severe pain.   PANTOPRAZOLE (PROTONIX) 40 MG TABLET    Take 1 tablet (40 mg total) by mouth  daily.   POTASSIUM CHLORIDE (K-DUR) 10 MEQ TABLET    Take 1 tablet (10 mEq total) by mouth daily as needed (to take with Lasix).   RIVAROXABAN (XARELTO) 15 MG TABS TABLET    Take 1 tablet (15 mg total) by mouth 2 (two) times daily with a meal.   RIVAROXABAN (XARELTO) 20 MG TABS TABLET    Take 1 tablet (20 mg total) by mouth daily with supper.   SACCHAROMYCES BOULARDII (FLORASTOR) 250 MG CAPSULE    Take 1 capsule (250 mg total) by mouth 2 (two) times daily.   TRAMADOL (ULTRAM) 50 MG TABLET    Take 1 tablet (50 mg total) by mouth every 6 (six) hours as needed for moderate pain.   VENTOLIN HFA 108 (90 BASE) MCG/ACT INHALER    INhaLe 2 PufFS by mouth every 4 hours as needed for shortness of breath or wheezing  Modified Medications   No medications on file  Discontinued Medications   No medications on file     Allergies  Allergen Reactions  . Cymbalta [Duloxetine Hcl] Other (See Comments)    Depression  . Lyrica [Pregabalin] Other (See Comments)    Blurry vision     REVIEW OF SYSTEMS:  GENERAL: no change in appetite, no fatigue EYES: Denies change in vision, dry eyes, eye pain, itching or discharge EARS: Denies change in hearing, ringing in ears, or earache NOSE: Denies nasal congestion or epistaxis MOUTH and THROAT: Denies oral discomfort, gingival pain or bleeding, pain from teeth or hoarseness   RESPIRATORY: no cough, SOB, DOE, wheezing, hemoptysis CARDIAC: no chest pain, or palpitations GI: no abdominal pain, diarrhea, constipation, heart burn, nausea or vomiting GU: Denies dysuria, frequency, hematuria, incontinence, or discharge PSYCHIATRIC: Denies feeling of depression or anxiety. No report of hallucinations, insomnia, paranoia, or agitation   PHYSICAL EXAMINATION  GENERAL APPEARANCE: In no acute distress. Normal body habitus HEAD: Normal in size and contour. No evidence of trauma EYES: Lids open and close normally. No blepharitis, entropion or ectropion. PERRL.  Conjunctivae are clear and sclerae are white. Lenses are without opacity EARS: Pinnae are normal. Patient hears normal voice tunes of the examiner MOUTH and THROAT: Lips are without lesions. Oral mucosa is moist and without lesions. Tongue is normal in shape, size, and color and without lesions NECK: supple, trachea midline, no neck masses, no thyroid tenderness, no thyromegaly LYMPHATICS: no LAN in the neck, no supraclavicular LAN RESPIRATORY: breathing is even & unlabored, BS CTAB CARDIAC: RRR, no murmur,no extra heart sounds, BLE edema trace GI: abdomen soft, normal BS, no masses, no tenderness, no hepatomegaly, no splenomegaly EXTREMITIES:  Able to move X 4 extremities PSYCHIATRIC:  Alert and oriented X 3. Affect and behavior are appropriate  LABS/RADIOLOGY: Labs reviewed: Basic Metabolic Panel:  Recent Labs  08/16/14 0855  08/20/14 0623  08/22/14 0518 08/22/14 1540 08/23/14 0524  NA  --   < > 137  < > 140 138 140  K  --   < > 4.2  < > 3.7 4.0 3.9  CL  --   < > 105  < > 99* 100* 100*  CO2  --   < > 26  < > 31 30 29   GLUCOSE  --   < > 103*  < > 98 111* 97  BUN  --   < > 24*  < > 22* 23* 19  CREATININE  --   < > 1.16*  < > 1.60* 1.53* 1.29*  CALCIUM  --   < > 8.2*  < > 8.7* 8.3* 8.8*  MG 1.8  --  1.4*  --   --   --   --   PHOS  --   --  4.5  --   --   --   --   < > = values in this interval not displayed. Liver Function Tests:  Recent Labs  08/17/14 0450 08/19/14 2120 08/20/14 0623  AST 25 24 22   ALT 22 20 18   ALKPHOS 43 39 36*  BILITOT 0.6 0.8 0.8  PROT 6.4* 6.5 6.0*  ALBUMIN 3.4* 3.4* 3.1*    Recent Labs  08/10/14 2024  LIPASE 14*   CBC:  Recent Labs  02/19/14 1231  08/10/14 2024  08/21/14 0508 08/22/14 0518 08/23/14 0524  WBC 12.0*  < > 6.7  < > 7.6 8.3 7.3  NEUTROABS 8.9*  --  4.2  --   --   --   --   HGB 13.8  < > 11.2*  < > 10.2* 10.1* 10.3*  HCT 41.2  < > 34.9*  < > 31.8* 32.2* 33.2*  MCV 95.8  < > 91.8  < > 91.1 90.4 91.5  PLT 186  < > 193   < > 159 160 165  < > = values in this interval not displayed.  Cardiac Enzymes:  Recent Labs  08/19/14 2301 08/20/14 0623 08/20/14 1225  TROPONINI 0.03 0.03 0.03   CBG:  Recent Labs  08/19/14 1528 08/22/14 1659  GLUCAP 109* 105*    Dg Chest 2 View  08/19/2014   CLINICAL DATA:  Shortness of breath and weakness  EXAM: CHEST  2 VIEW  COMPARISON:  August 16, 2014  FINDINGS: There is cardiomegaly with pulmonary vascular within normal limits. There is patchy consolidation in both lung bases with small pleural effusions. No adenopathy. There is postoperative change in the left breast region.  IMPRESSION: Suspect a degree of congestive heart failure. There may be superimposed pneumonia in the bases.   Electronically Signed   By: Lowella Grip III M.D.   On: 08/19/2014 15:49   Dg Chest 2 View  08/16/2014   CLINICAL DATA:  Shortness of breath. History of left breast carcinoma  EXAM: CHEST  2 VIEW  COMPARISON:  August 10, 2014  FINDINGS: There remains persistent elevation left hemidiaphragm with atelectasis in the left base are region. There is new opacity in the lateral right base, felt to represent a small focus of pneumonia. Upper and mid lung zones are clear. Heart is mildly enlarged but stable. The pulmonary vascularity is normal. No adenopathy. Surgical clips in left breast region are again noted. There is  degenerative change in the thoracic spine.  IMPRESSION: New infiltrate lateral right base. Persistent elevation left hemidiaphragm with left base atelectatic change. Stable cardiac prominence.   Electronically Signed   By: Lowella Grip III M.D.   On: 08/16/2014 10:58   Dg Chest 2 View  08/10/2014   CLINICAL DATA:  Intermittent shortness of breath  EXAM: CHEST  2 VIEW  COMPARISON:  April 27, 2013  FINDINGS: There is persistent elevation the left hemidiaphragm. There is patchy left base atelectasis. There is no appreciable edema or consolidation. The heart is slightly enlarged but stable.  The pulmonary vascularity is normal. No adenopathy. There are surgical clips in the left breast region. There is degenerative change in thoracic spine.  IMPRESSION: Stable elevation left hemidiaphragm with left base atelectasis. No edema or consolidation. Heart prominent but stable. Pulmonary vascularity within normal limits.   Electronically Signed   By: Lowella Grip III M.D.   On: 08/10/2014 20:24   Ct Angio Chest Pe W/cm &/or Wo Cm  08/19/2014   CLINICAL DATA:  Progressive shortness of breath following a cholecystectomy 1 week ago.  EXAM: CT ANGIOGRAPHY CHEST WITH CONTRAST  TECHNIQUE: Multidetector CT imaging of the chest was performed using the standard protocol during bolus administration of intravenous contrast. Multiplanar CT image reconstructions and MIPs were obtained to evaluate the vascular anatomy.  CONTRAST:  6mL OMNIPAQUE IOHEXOL 350 MG/ML SOLN  COMPARISON:  Chest radiographs obtained earlier today. Chest CT dated 09/19/2003.  FINDINGS: Diffusely enlarged heart. Small to moderate-sized bilateral pleural effusions, larger on the right. Adjacent bilateral lower lobe atelectasis. Mildly prominent interstitial markings. Mild bullous changes.  Small number of small to moderate-sized pulmonary arterial filling defects bilaterally. The right ventricular 2 left ventricular ratio is difficult to determine due to lack of opacification of the left ventricle. The ratio is approximately 1.2.  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 Thoracic spine degenerative changes are noted. No lung nodules or enlarged lymph nodes.  Review of the MIP images confirms the above findings.  IMPRESSION: 1. Multiple small to moderate-sized bilateral pulmonary emboli. 2. CT evidence of right heart strain (RV/LV Ratio = 1.2) consistent with at least submassive (intermediate risk) PE. The presence of right heart strain has been associated with an increased risk of morbidity and mortality. Please activate Code  PE by paging [phone]. 3. Small to moderate-sized bilateral pleural effusions, larger on the right. 4. Bilateral lower lobe atelectasis. 5. Mild changes of COPD with mild superimposed interstitial pulmonary edema. Critical Value/emergent results were called by telephone at the time of interpretation on 08/19/2014 at 8:49 pm to Dr. Toy Baker , who verbally acknowledged these results.   Electronically Signed   By: Claudie Revering M.D.   On: 08/19/2014 20:58   Nm Hepatobiliary Liver Func  08/11/2014   CLINICAL DATA:  Predominantly lower abdominal pain and nausea for 3 weeks. No vomiting. Possible acute cholecystitis.  EXAM: NUCLEAR MEDICINE HEPATOBILIARY IMAGING  TECHNIQUE: Sequential images of the abdomen were obtained out to 60 minutes following intravenous administration of radiopharmaceutical.  RADIOPHARMACEUTICALS:  5.0 mCi Tc-63m  Choletec IV  COMPARISON:  Right upper quadrant ultrasound 08/10/2014  FINDINGS: There is prompt radiotracer uptake by the liver with excretion into the biliary system. Gallbladder activity is identified by 20 minutes with progressive accumulation. Radiotracer excretion into the small bowel is identified during the second hour of imaging.  IMPRESSION: Patent cystic duct without evidence of acute cholecystitis.   Electronically Signed   By: Zenia Resides  Jeralyn Ruths   On: 08/11/2014 11:49   Ct Abdomen Pelvis W Contrast  08/10/2014   CLINICAL DATA:  Subacute onset of mid and upper abdominal pain for 2 weeks. Throat burning. Nausea. Decreased appetite and oral intake. Initial encounter.  EXAM: CT ABDOMEN AND PELVIS WITH CONTRAST  TECHNIQUE: Multidetector CT imaging of the abdomen and pelvis was performed using the standard protocol following bolus administration of intravenous contrast.  CONTRAST:  142mL OMNIPAQUE IOHEXOL 300 MG/ML  SOLN  COMPARISON:  CT of the pelvis performed 02/19/2014, and pelvic ultrasound performed 10/14/2003  FINDINGS: Small bilateral pleural effusions are noted.  The heart is mildly enlarged. Scattered coronary artery calcifications are seen.  Pericholecystic fluid is noted, and a stone is noted within the gallbladder. This raises question for mild acute cholecystitis. Would correlate for associated symptoms. A 1.2 cm cystic focus within the left hepatic lobe is of uncertain significance. Trace ascites is noted tracking about the inferior tip of the liver.  The visualized portions of the spleen are grossly unremarkable. The pancreas demonstrates minimal surrounding soft tissue stranding, though this remains nonspecific. There is mild stranding within the small bowel mesentery, with scattered associated borderline prominent nodes. The adrenal glands are unremarkable in appearance.  Mild calcification adjacent to the left adrenal gland may be vascular in nature.  Mild bilateral renal atrophy is noted. Nonspecific perinephric stranding is noted bilaterally. A few scattered small bilateral renal cysts are seen. There is no evidence of hydronephrosis. No renal or ureteral stones are identified.  The small bowel is unremarkable in appearance. The stomach is within normal limits. No acute vascular abnormalities are seen. Scattered calcification is noted along the abdominal aorta.  The appendix is normal in caliber, without evidence of appendicitis. Trace fluid is noted along the paracolic gutters bilaterally, more prominent on the right. Minimal diverticulosis is noted at the proximal sigmoid colon. The colon is otherwise unremarkable.  The bladder is decompressed and not well seen. A small amount of free fluid is noted within the pelvis. The uterus is grossly unremarkable in appearance. The ovaries are relatively symmetric. No suspicious adnexal masses are seen. No inguinal lymphadenopathy is seen.  No acute osseous abnormalities are identified. There is mild chronic loss of height at the superior endplate of vertebral body L4. The patient is status post decompression at L3 and  L4.  IMPRESSION: 1. Pericholecystic fluid noted, with cholelithiasis. Though this is nonspecific given underlying trace ascites, it raises question for mild acute cholecystitis. Would correlate for associated symptoms. 2. Nonspecific minimal soft tissue stranding about the pancreas, without definite evidence for pancreatitis. Mild stranding is also noted within the adjacent small bowel mesentery, with associated borderline prominent nodes. This may reflect the patient's baseline. 3. Trace ascites noted about the liver and within the pelvis. 4. Small bilateral pleural effusions noted. 5. Mild cardiomegaly. Scattered coronary artery calcifications seen. 6. Nonspecific 1.2 cm cystic focus within the left hepatic lobe. 7. Mild bilateral renal atrophy noted. Scattered bilateral renal cysts seen. 8. Scattered calcification along the abdominal aorta. 9. Minimal diverticulosis of the proximal sigmoid colon, without evidence of diverticulitis. 10. Mild chronic loss of height at the superior endplate of vertebral body L4.   Electronically Signed   By: Garald Balding M.D.   On: 08/10/2014 22:30   US Abdomen Limited Ruq  08/11/2014   CLINICAL DATA:  81 year old female with right upper quadrant abdominal pain  EXAM: US ABDOMEN LIMITED - RIGHT UPPER QUADRANT  COMPARISON:  CT dated 08/10/2014  FINDINGS:  Gallbladder:  There is a 1.2 cm stone in the gallbladder. There is thickening of the anterior gallbladder wall measuring up to 1 cm. No significant pericholecystic fluid. No tenderness was elicited over the gallbladder area during scanning.  Common bile duct:  Diameter: 4 mm  Liver:  No focal lesion identified. Within normal limits in parenchymal echogenicity.  A right-sided pleural effusion is partially visualized.  IMPRESSION: Cholelithiasis with thickened anterior gallbladder wall. Findings may represent acute cholecystitis. A hepatobiliary scintigraphy may provide better evaluation of the gallbladder if an acute  cholecystitis is clinically suspected.   Electronically Signed   By: Anner Crete M.D.   On: 08/11/2014 00:05    ASSESSMENT/PLAN:  Physical deconditioning - for rehabilitation  Acute pulmonary embolism - continue Xarelto 15 mg 1 tab PO BID till 8/24 the Xarelto 20 mg 1 tab PO Q D (start on 8/25)  Chronic combined systolic and diastolic CHF - continue Lasix 20 mg 1 tab PO Q D and KCL 10 meq 1 tab PO Q D; follow-up with Dr. Marlou Porch, cardiology, on 8/31  HCAP -  Continue Levaquin 750 mg 1 PO Q other day   CKD stage III - creatinine 1.29; @ baseline; check BMP  Anemia of chronic disease - hgb 10.3; check CBC  PSVT - continue Coreg 3.125 mg 1 tab PO BID  Hypertension - continue Carvedilol 3.125 mg 1 tab PO BID and Lasix 20 mg 1 tab PO Q D  Anxiety - mood is stable; continue Xanax 0.25 mg 1 tab PO TID PRN  Hypothyroidism - tsh 2.534; continue Synthroid 25 mcg 1 tab PO Q D  GERD - continue Protonix 40 mg 1 tab PO Q D  Protein-calorie malnutrition - albumin 3.1; continue supplementation     Goals of care:  Short-term rehabilitation    [facility], NP [facility] [phone]

## 2014-09-17 ENCOUNTER — Encounter: Payer: Commercial Managed Care - HMO | Admitting: Neurology

## 2014-09-18 ENCOUNTER — Encounter: Payer: Self-pay | Admitting: Cardiology

## 2014-09-18 ENCOUNTER — Ambulatory Visit (INDEPENDENT_AMBULATORY_CARE_PROVIDER_SITE_OTHER): Payer: Commercial Managed Care - HMO | Admitting: Cardiology

## 2014-09-18 VITALS — BP 115/60 | HR 97 | Ht 64.0 in | Wt 194.0 lb

## 2014-09-18 DIAGNOSIS — R0902 Hypoxemia: Secondary | ICD-10-CM

## 2014-09-18 DIAGNOSIS — J189 Pneumonia, unspecified organism: Secondary | ICD-10-CM

## 2014-09-18 DIAGNOSIS — I2699 Other pulmonary embolism without acute cor pulmonale: Secondary | ICD-10-CM

## 2014-09-18 DIAGNOSIS — I5043 Acute on chronic combined systolic (congestive) and diastolic (congestive) heart failure: Secondary | ICD-10-CM

## 2014-09-18 DIAGNOSIS — N183 Chronic kidney disease, stage 3 unspecified: Secondary | ICD-10-CM

## 2014-09-18 DIAGNOSIS — I5181 Takotsubo syndrome: Secondary | ICD-10-CM

## 2014-09-18 DIAGNOSIS — I471 Supraventricular tachycardia: Secondary | ICD-10-CM

## 2014-09-18 MED ORDER — LISINOPRIL 2.5 MG PO TABS: 1.2500 mg | ORAL_TABLET | Freq: Every day | ORAL | Status: DC

## 2014-09-18 NOTE — Patient Instructions (Addendum)
Medication Instructions:  Your physician has recommended you make the following change in your medication:  Increase furosemide to 40 mg by mouth daily for 2 days and then return to 20 mg by mouth daily. Start lisinopril 1.25 mg by mouth daily on September 3,2016   Labwork: Your physician recommends that you return for lab work on September 7,2016.  BNP, BMP..    Testing/Procedures: Your physician has requested that you have an echocardiogram. Echocardiography is a painless test that uses sound waves to create images of your heart. It provides your doctor with information about the size and shape of your heart and how well your heart's chambers and valves are working. This procedure takes approximately one hour. There are no restrictions for this procedure.  To be done in 2 weeks.     Follow-Up: Your physician recommends that you schedule a follow-up appointment in 3 weeks with Dr. Marlou Porch or with NP or PA on day Dr.Skains is in office.   Weigh daily Call [phone] if weight climbs more than 3 pounds in a day or 5 pounds in a week. No salt to very little salt in your diet.  No more than 2000 mg in a day. Call if increased shortness of breath or increased swelling.

## 2014-09-18 NOTE — Progress Notes (Signed)
Cardiology Office Note   Date:  09/18/2014   ID:  Alexandra [name], DOB 04-08-1927, MRN [id number]  PCP:   Melinda Crutch, MD  Cardiologist:  Dr. Marlou Porch    Chief Complaint  Patient presents with  . Shortness of Breath      History of Present Illness: Alexandra [name] is a 81 y.o. 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 Last echogram 08/15/14 showed EF of 20% patient was seen during prior admission by cardiology consult who recommended starting low-dose Coreg patient was gently diuresed while hospitalized. There was concern of over diuresis the patient was discharged on as needed Lasix only. ACE inhibitor was held due to concern of history of chronic kidney disease and patient was supposed to follow-up with her primary cardiologist to see if she could tolerate it.  Hospital stay was complicated by SVT for which her Coreg dose has been adjusted up.  Her troponins were negative.  Dr. Burt Knack stated " It is possible that she has obstructive CAD but she doesn't have any history of angina and her cardiac markers are negative. Favor treatment with carvedilol as initiated"  Her PMH includes Asthma and LBBB. 2013 for chest pain with a nuclear stress test which was low risk and an echo which showed her EF to be 50-55%. She was seen again in 2014 for chest pain felt to be non cardiac. She was diagnosed with HTN at that time. She has no history of MI or dyslipidemia.   Pt was re-admitted withlin one day of discharge with PE./Lower ext DVT- Bil  And CHF and PNA. She was placed on Xarelto and diuresed.  Wt at discharge 196 down from 209 on admit. She was discharged to [facility] place. She was discharged on 02 which she has continued to need.    At [facility] place she had an episode of CHF on CXR on the 20th and given IV lasix.  Her breathing has improved since that time.  She feels stronger and plans are to discharge home  tomorrow.   Her coreg was decreased due to BP.   Today she is here for follow up.  She has some chest pain but much better than admit with PE.  +DOE.  She coughs up some blood just in the AM.  Her oxygen is with humidity at [facility].  She is not yet on ACE.  Her last Cr is 1.10.      Past Medical History  Diagnosis Date  . Arthritis   . Headache(784.0)   . Anxiety   . Depression   . Asthma   . CAP (community acquired pneumonia) 12/19/2012  . Intermittent LBBB (left bundle branch block) 12/21/2012  . Obesity   . Peripheral neuropathy   . Sacroiliitis, not elsewhere classified   . Osteopenia   . Hypercholesteremia   . Neuropathy   . Trigger finger     [name]  . Chest pain     NM stress test 05/2011 Normal  . DOE (dyspnea on exertion)     No SOB at rest  . Fatigue     excertional  . breast ca dx'd 2000    left  . Essential (primary) hypertension 04/02/2014  . Adult hypothyroidism 04/02/2014  . Hereditary and idiopathic peripheral neuropathy 07/02/2014  . Leg weakness   . CKD (chronic kidney disease) stage 3, GFR 30-59 ml/min 08/13/2014  . Anemia of chronic disease 08/13/2014  .  CHF (congestive heart failure) 08/19/2014    Past Surgical History  Procedure Laterality Date  . Leg surgery    . Breast lumpectomy      breast cancer  . Lumbar laminectomy    . Achilles tendon surgery    . Tonsillectomy    . Vesicovaginal fistula closure w/ tah      DC hysterectomy  . Polypectomy    . Cholecystectomy N/A 08/13/2014    Procedure: LAPAROSCOPIC CHOLECYSTECTOMY;  Surgeon: Stark Klein, MD;  Location: [facility] ORS;  Service: General;  Laterality: N/A;     Current Outpatient Prescriptions  Medication Sig Dispense Refill  . alendronate (FOSAMAX) 70 MG tablet Take 70 mg by mouth every Saturday. Take with a full glass of water on an empty stomach.    . ALPRAZolam (XANAX) 0.25 MG tablet Take 0.25 mg by mouth 3 (three) times daily as needed for anxiety.    Marland Kitchen aspirin EC 81 MG tablet Take 81 mg  by mouth daily.    Marland Kitchen b complex vitamins tablet Take 1 tablet by mouth daily.    . Calcium-Vitamin D (CALTRATE 600 PLUS-VIT D PO) Take 1 tablet by mouth 2 (two) times daily.    . carvedilol (COREG) 3.125 MG tablet Take 3.125 mg by mouth 2 (two) times daily with a meal.    . cholecalciferol (VITAMIN D) 1000 UNITS tablet Take 1,000 Units by mouth daily.    . furosemide (LASIX) 20 MG tablet Take 20 mg by mouth daily.    Marland Kitchen levothyroxine (SYNTHROID, LEVOTHROID) 25 MCG tablet Take one tablet by mouth once daily in the morning before breakfast for thyroid    . Multiple Vitamin (MULITIVITAMIN WITH MINERALS) TABS Take 1 tablet by mouth daily.    . nitroGLYCERIN (NITROSTAT) 0.4 MG SL tablet Place 0.4 mg under the tongue every 5 (five) minutes as needed for chest pain.    Marland Kitchen oxyCODONE-acetaminophen (PERCOCET/ROXICET) 5-325 MG per tablet Take 1 tablet by mouth every 6 (six) hours as needed for severe pain. 10 tablet 0  . pantoprazole (PROTONIX) 40 MG tablet Take 40 mg by mouth 2 (two) times daily.    . potassium chloride (K-DUR) 10 MEQ tablet Take 1 tablet (10 mEq total) by mouth daily as needed (to take with Lasix). 30 tablet 0  . promethazine (PHENERGAN) 25 MG tablet Take one tablet by mouth every 8 hours as needed for nausea    . Rivaroxaban (XARELTO) 15 MG TABS tablet Take 1 tablet (15 mg total) by mouth 2 (two) times daily with a meal. 30 tablet   . rivaroxaban (XARELTO) 20 MG TABS tablet Take 1 tablet (20 mg total) by mouth daily with supper. 30 tablet   . traMADol (ULTRAM) 50 MG tablet Take 1 tablet (50 mg total) by mouth every 6 (six) hours as needed for moderate pain. 60 tablet 3  . VENTOLIN HFA 108 (90 BASE) MCG/ACT inhaler INhaLe 2 PufFS by mouth every 4 hours as needed for shortness of breath or wheezing  3  . lisinopril (PRINIVIL,ZESTRIL) 2.5 MG tablet Take 0.5 tablets (1.25 mg total) by mouth daily. 15 tablet 6   No current facility-administered medications for this visit.    Allergies:    Cymbalta and Lyrica    Social History:  The patient  reports that she has never smoked. She has never used smokeless tobacco. She reports that she does not drink alcohol or use illicit drugs.   Family History:  The patient's family history includes Alzheimer's disease in her  sister; CVA in her daughter; Diabetes in her brother; Hypertension in her daughter; Pancreatic cancer (age of onset: 19) in her father; Pneumonia (age of onset: 8) in her mother; Stroke in her daughter. There is no history of Heart attack.    ROS:  General:no colds or fevers, no weight changes- down from discharge wt.  + weakness.   Skin:no rashes or ulcers HEENT:no blurred vision, no congestion blood with sputum in AM.  CV:see HPI PUL:see HPI- still on home oxygen GI:no diarrhea but freq stools, constipation or melena, no indigestion GU:no hematuria, no dysuria MS:no joint pain, no claudication Neuro:no syncope, no lightheadedness Endo:no diabetes, + thyroid disease  Wt Readings from Last 3 Encounters:  09/18/14 194 lb (87.998 kg)  08/28/14 197 lb (89.359 kg)  08/26/14 196 lb (88.905 kg)     PHYSICAL EXAM: VS:  BP 115/60 mmHg  Pulse 97  Ht 5\' 4"  (1.626 m)  Wt 194 lb (87.998 kg)  BMI 33.28 kg/m2  SpO2 99% , BMI Body mass index is 33.28 kg/(m^2). General:Pleasant affect, NAD Skin:Warm and dry, brisk capillary refill HEENT:normocephalic, sclera clear, mucus membranes moist, pharynx without ulcers are any blood.  Neck:supple, no JVD, no bruits  Heart:S1S2 RRR with 2/6 systolic murmur, no gallup, rub or click Lungs: with rales bibasilar , no rhonchi, or wheezes [id number], non tender, + BS, do not palpate liver spleen or masses Ext:no lower ext edema, 2+ pedal pulses, 2+ radial pulses Neuro:alert and oriented X 3, MAE, follows commands, + facial symmetry    EKG:  EKG is ordered today. The ekg ordered today demonstrates SR with LBBB since 2015.  No acute changes.    Recent Labs: 08/19/2014: B Natriuretic  Peptide 1627.6* 08/20/2014: ALT 18; Magnesium 1.4*; TSH 2.534 08/23/2014: BUN 19; Creatinine, Ser 1.29*; Potassium 3.9; Sodium 140 08/27/2014: Hemoglobin 9.3*; Platelets 170    Lipid Panel No results found for: CHOL, TRIG, HDL, CHOLHDL, VLDL, LDLCALC, LDLDIRECT     Other studies Reviewed: Additional studies/ records that were reviewed today include: CT scan of chest  With multiple emboli, vascular study, SNF note, both sets of hospital notes.  Echo.   ASSESSMENT AND PLAN:  1.  Taktosubo- cardiomyopathy most likely with EF 20% has been on coreg, difficult to increase dose.  Will add very low dose of lisinopril  On Sat. if BP tolerates.  Recheck BMP next week wed. Plan echo in 2 weeks.  Follow up with Dr. Marlou Porch in 3 weeks or on day he is in the office.  Will check BNP on day of labs.  2.  PSVT no further episodes.on coreg.  3. CHF, systolic with increase due to PE and PNA.  On lasix will increase for 2 days then back to 20 mg daily.   BNP BNP next week.  4. PE- multiple emboli and bil DVT on xarelto, completed 15 mg BID  Now on 20 mg daily.  5.  PNA resolved. Last CXR on the 20th with pul edema and treated.  6. Hypoxia, to go home with oxygen.  I recommended to family to continue until seen back in 3 weeks.  7. CKD-3  monitor closely with ACE.  8.  Streaks of blood in am mucus, pharynx clear, poss from sinus drainage at night and clears in AM. She will monitor, needs to continue humidity with oxygen.   9. Chest pain, EKG stable though she has LBBB, chronic.  Her pain seems related to PE. Was more significant on that admit. troponins with that  admit 08/19/14 and admit in July with neg troponins.  She may need lexiscan myoview but if possible would be better for pt to recover from multiple issues unless chest pain became an issue.   Discussed pt's condition with pt and 2 daughters one by face time.   Current medicines are reviewed with the patient today.  The patient Has no concerns  regarding medicines.  The following changes have been made:  See above Labs/ tests ordered today include:see above  Disposition:   FU:  see above  Lennie Muckle, NP  09/18/2014 6:08 PM    [facility] [place], [place], [place], [place] Phone: [phone]; Fax: [phone]

## 2014-09-19 ENCOUNTER — Non-Acute Institutional Stay (SKILLED_NURSING_FACILITY): Payer: Commercial Managed Care - HMO | Admitting: Adult Health

## 2014-09-19 ENCOUNTER — Encounter: Payer: Self-pay | Admitting: Adult Health

## 2014-09-19 DIAGNOSIS — I471 Supraventricular tachycardia, unspecified: Secondary | ICD-10-CM

## 2014-09-19 DIAGNOSIS — N183 Chronic kidney disease, stage 3 unspecified: Secondary | ICD-10-CM

## 2014-09-19 DIAGNOSIS — I10 Essential (primary) hypertension: Secondary | ICD-10-CM

## 2014-09-19 DIAGNOSIS — I5043 Acute on chronic combined systolic (congestive) and diastolic (congestive) heart failure: Secondary | ICD-10-CM

## 2014-09-19 DIAGNOSIS — K219 Gastro-esophageal reflux disease without esophagitis: Secondary | ICD-10-CM

## 2014-09-19 DIAGNOSIS — D638 Anemia in other chronic diseases classified elsewhere: Secondary | ICD-10-CM | POA: Diagnosis not present

## 2014-09-19 DIAGNOSIS — R5381 Other malaise: Secondary | ICD-10-CM

## 2014-09-19 DIAGNOSIS — E039 Hypothyroidism, unspecified: Secondary | ICD-10-CM | POA: Diagnosis not present

## 2014-09-19 DIAGNOSIS — J189 Pneumonia, unspecified organism: Secondary | ICD-10-CM | POA: Diagnosis not present

## 2014-09-19 DIAGNOSIS — I2699 Other pulmonary embolism without acute cor pulmonale: Secondary | ICD-10-CM

## 2014-09-19 DIAGNOSIS — E46 Unspecified protein-calorie malnutrition: Secondary | ICD-10-CM | POA: Diagnosis not present

## 2014-09-19 DIAGNOSIS — F419 Anxiety disorder, unspecified: Secondary | ICD-10-CM

## 2014-09-19 NOTE — Progress Notes (Signed)
Patient ID: Alexandra [name], female   DOB: [birth year], 81 y.o.   MRN: [id number]      DATE:  09/19/14 MRN:  [id number]  BIRTHDAY: 26-Dec-1927  Facility:  Nursing Home Location:  [facility] Room Number: 908-P  LEVEL OF CARE:  SNF [phone])  Contact Information    Name [place] Son [phone]  [place] Relative [phone]  [phone]   [place] Daughter   [phone]       Chief Complaint  Patient presents with  . Discharge Note    Physical deconditioning, PSVT, PE, Combined systolic and diastolic HF, Anemia, CKD stage III, Hypertension, Anxiety, Hypothyroidism, GERD and Protein-calorie malnutrition   standard  HISTORY OF PRESENT ILLNESS:  This is an 81 year old female who is for Home health PT, OT and CNA. DME: standard wheelchair with elevating leg rests, cushion, anti-tippers, oxygen portable and stationary. She has been admitted to [facility] on 08/23/14 from [facility]. She went to the hospital with abdominal pain and was found to have cholecystitis. She was suspected Takotsubo cardiomyopathy secondary to recent death of husband. Last echocardiogram showed EF 20%. Hospital stay was complicated by SVT and Coreg was adjusted. CT showed new PE. She was started on Xarelto. On discharge, she was found to have a new right base infiltrate.  Patient was admitted to this facility for short-term rehabilitation after the patient's recent hospitalization.  Patient has completed SNF rehabilitation and therapy has cleared the patient for discharge.  PAST MEDICAL HISTORY:  Past Medical History  Diagnosis Date  . Arthritis   . Headache(784.0)   . Anxiety   . Depression   . Asthma   . CAP (community acquired pneumonia) 12/19/2012  . Intermittent LBBB (left bundle branch block) 12/21/2012  . Obesity   . Peripheral neuropathy   . Sacroiliitis, not elsewhere classified   . Osteopenia   .  Hypercholesteremia   . Neuropathy   . Trigger finger     [name]  . Chest pain     NM stress test 05/2011 Normal  . DOE (dyspnea on exertion)     No SOB at rest  . Fatigue     excertional  . breast ca dx'd 2000    left  . Essential (primary) hypertension 04/02/2014  . Adult hypothyroidism 04/02/2014  . Hereditary and idiopathic peripheral neuropathy 07/02/2014  . Leg weakness   . CKD (chronic kidney disease) stage 3, GFR 30-59 ml/min 08/13/2014  . Anemia of chronic disease 08/13/2014  . CHF (congestive heart failure) 08/19/2014     CURRENT MEDICATIONS: Reviewed  Patient's Medications  New Prescriptions   No medications on file  Previous Medications   ALENDRONATE (FOSAMAX) 70 MG TABLET    Take 70 mg by mouth every Saturday. Take with a full glass of water on an empty stomach.   ALPRAZOLAM (XANAX) 0.25 MG TABLET    Take 0.25 mg by mouth 3 (three) times daily as needed for anxiety.   ASPIRIN EC 81 MG TABLET    Take 81 mg by mouth daily.   B COMPLEX VITAMINS TABLET    Take 1 tablet by mouth daily.   CALCIUM-VITAMIN D (CALTRATE 600 PLUS-VIT D PO)    Take 1 tablet by mouth 2 (two) times daily.   CARVEDILOL (COREG) 3.125 MG TABLET    Take 3.125 mg by mouth 2 (two) times daily with a meal.   CHOLECALCIFEROL (VITAMIN D) 1000 UNITS  TABLET    Take 1,000 Units by mouth daily.   FUROSEMIDE (LASIX) 20 MG TABLET    Take 20 mg by mouth daily.   LEVOTHYROXINE (SYNTHROID, LEVOTHROID) 25 MCG TABLET    Take one tablet by mouth once daily in the morning before breakfast for thyroid   LISINOPRIL (PRINIVIL,ZESTRIL) 2.5 MG TABLET    Take 0.5 tablets (1.25 mg total) by mouth daily.   MULTIPLE VITAMIN (MULITIVITAMIN WITH MINERALS) TABS    Take 1 tablet by mouth daily.   NITROGLYCERIN (NITROSTAT) 0.4 MG SL TABLET    Place 0.4 mg under the tongue every 5 (five) minutes as needed for chest pain.   OXYCODONE-ACETAMINOPHEN (PERCOCET/ROXICET) 5-325 MG PER TABLET    Take 1 tablet by mouth every 6 (six) hours as  needed for severe pain.   PANTOPRAZOLE (PROTONIX) 40 MG TABLET    Take 40 mg by mouth 2 (two) times daily.   POTASSIUM CHLORIDE (K-DUR) 10 MEQ TABLET    Take 1 tablet (10 mEq total) by mouth daily as needed (to take with Lasix).   PROMETHAZINE (PHENERGAN) 25 MG TABLET    Take one tablet by mouth every 8 hours as needed for nausea   RIVAROXABAN (XARELTO) 15 MG TABS TABLET    Take 1 tablet (15 mg total) by mouth 2 (two) times daily with a meal.   RIVAROXABAN (XARELTO) 20 MG TABS TABLET    Take 1 tablet (20 mg total) by mouth daily with supper.   TRAMADOL (ULTRAM) 50 MG TABLET    Take 1 tablet (50 mg total) by mouth every 6 (six) hours as needed for moderate pain.   VENTOLIN HFA 108 (90 BASE) MCG/ACT INHALER    INhaLe 2 PufFS by mouth every 4 hours as needed for shortness of breath or wheezing  Modified Medications   No medications on file  Discontinued Medications   No medications on file     Allergies  Allergen Reactions  . Cymbalta [Duloxetine Hcl] Other (See Comments)    Depression  . Lyrica [Pregabalin] Other (See Comments)    Blurry vision     REVIEW OF SYSTEMS:  GENERAL: no change in appetite, no fatigue EYES: Denies change in vision, dry eyes, eye pain, itching or discharge EARS: Denies change in hearing, ringing in ears, or earache NOSE: Denies nasal congestion or epistaxis MOUTH and THROAT: Denies oral discomfort, gingival pain or bleeding, pain from teeth or hoarseness   RESPIRATORY: no cough, SOB, DOE, wheezing, hemoptysis CARDIAC: no chest pain, or palpitations GI: no abdominal pain, diarrhea, constipation, heart burn, nausea or vomiting GU: Denies dysuria, frequency, hematuria, incontinence, or discharge PSYCHIATRIC: Denies feeling of depression or anxiety. No report of hallucinations, insomnia, paranoia, or agitation   PHYSICAL EXAMINATION  GENERAL APPEARANCE: In no acute distress. Normal body habitus HEAD: Normal in size and contour. No evidence of trauma EYES:  Lids open and close normally. No blepharitis, entropion or ectropion. PERRL. Conjunctivae are clear and sclerae are white. Lenses are without opacity EARS: Pinnae are normal. Patient hears normal voice tunes of the examiner MOUTH and THROAT: Lips are without lesions. Oral mucosa is moist and without lesions. Tongue is normal in shape, size, and color and without lesions NECK: supple, trachea midline, no neck masses, no thyroid tenderness, no thyromegaly LYMPHATICS: no LAN in the neck, no supraclavicular LAN RESPIRATORY: breathing is even & unlabored, BS CTAB CARDIAC: RRR, no extra heart sounds, BLE edema trace GI: abdomen soft, normal BS, no masses, no tenderness, no hepatomegaly,  no splenomegaly EXTREMITIES:  Able to move X 4 extremities PSYCHIATRIC: Alert and oriented X 3. Affect and behavior are appropriate  LABS/RADIOLOGY: Labs reviewed: 09/07/14  chest x-ray findings are most consistent with congestive heart failure and pulmonary edema, without focal consolidation WBC 6.5 hemoglobin 10.3 hematocrit 32.9 MCV 87.5 platelet count 204 sodium 140 potassium 5.0 glucose 136 BUN 20 creatinine 1.10 calcium 9.3 09/04/14  WBC 6.4 hemoglobin 10.1 hematocrit 31.7 MCV 88.3 platelet count 183 sodium 142 potassium 4.5 glucose 90 BUN 24 creatinine 1.07 calcium 8.8 09/03/14  WBC 6.8 hemoglobin 10.9 hematocrit 33.6 MCV 87.3 platelet count 187 sodium 140 potassium 4.6 glucose 91 BUN 23 creatinine 1.15 calcium 9.3 08/27/14  WBC 8.9 hemoglobin 9.3 hematocrit 29.2 MCV 87.2 platelet count [id number] Basic Metabolic Panel:  Recent Labs  08/16/14 0855  08/20/14 0623  08/22/14 0518 08/22/14 1540 08/23/14 0524  NA  --   < > 137  < > 140 138 140  K  --   < > 4.2  < > 3.7 4.0 3.9  CL  --   < > 105  < > 99* 100* 100*  CO2  --   < > 26  < > 31 30 29   GLUCOSE  --   < > 103*  < > 98 111* 97  BUN  --   < > 24*  < > 22* 23* 19  CREATININE  --   < > 1.16*  < > 1.60* 1.53* 1.29*  CALCIUM  --   < > 8.2*  < > 8.7* 8.3* 8.8*    MG 1.8  --  1.4*  --   --   --   --   PHOS  --   --  4.5  --   --   --   --   < > = values in this interval not displayed. Liver Function Tests:  Recent Labs  08/17/14 0450 08/19/14 2120 08/20/14 0623  AST 25 24 22   ALT 22 20 18   ALKPHOS 43 39 36*  BILITOT 0.6 0.8 0.8  PROT 6.4* 6.5 6.0*  ALBUMIN 3.4* 3.4* 3.1*    Recent Labs  08/10/14 2024  LIPASE 14*   CBC:  Recent Labs  02/19/14 1231  08/10/14 2024  08/21/14 0508 08/22/14 0518 08/23/14 0524 08/27/14  WBC 12.0*  < > 6.7  < > 7.6 8.3 7.3 8.9  NEUTROABS 8.9*  --  4.2  --   --   --   --   --   HGB 13.8  < > 11.2*  < > 10.2* 10.1* 10.3* 9.3*  HCT 41.2  < > 34.9*  < > 31.8* 32.2* 33.2* 29*  MCV 95.8  < > 91.8  < > 91.1 90.4 91.5  --   PLT 186  < > 193  < > 159 160 165 170  < > = values in this interval not displayed.  Cardiac Enzymes:  Recent Labs  08/19/14 2301 08/20/14 0623 08/20/14 1225  TROPONINI 0.03 0.03 0.03   CBG:  Recent Labs  08/19/14 1528 08/22/14 1659  GLUCAP 109* 105*     ASSESSMENT/PLAN:  Physical deconditioning - for home health PT, OT and CNA  Acute pulmonary embolism - continueXarelto 20 mg 1 tab PO Q D   Chronic combined systolic and diastolic CHF - continue Lasix 20 mg 1 tab PO Q D, recently started on Lisinopril 1.25 mg daily and KCL 10 meq 1 tab PO Q D; follow-up with Dr. Marlou Porch, cardiology  HCAP -  resolved  CKD stage III - creatinine 1.10; @ baseline  Anemia of chronic disease - hgb 10.3; stable  PSVT - continue Coreg   Hypertension - continue Carvedilol  and Lasix 20 mg 1 tab PO Q D  Anxiety - mood is stable; continue Xanax 0.25 mg 1 tab PO TID PRN  Hypothyroidism - tsh 2.534; continue Synthroid 25 mcg 1 tab PO Q D  GERD - continue Protonix 40 mg 1 tab PO Q D  Protein-calorie malnutrition - albumin 3.1; continue supplementation      I have filled out patient's discharge paperwork and written prescriptions.  Patient will receive home health PT, OT and  CNA.  DME provided: standard wheelchair with elevating leg rests, cushion, anti-tippers, oxygen portable and stationary  Total discharge time: Greater than 30 minutes  Discharge time involved coordination of the discharge process with social worker, nursing staff and therapy department. Medical justification for home health services/DME verified.     [facility], NP [employer] [phone]

## 2014-09-20 DIAGNOSIS — I509 Heart failure, unspecified: Secondary | ICD-10-CM | POA: Diagnosis not present

## 2014-09-20 DIAGNOSIS — Z86711 Personal history of pulmonary embolism: Secondary | ICD-10-CM | POA: Diagnosis not present

## 2014-09-20 DIAGNOSIS — Z7901 Long term (current) use of anticoagulants: Secondary | ICD-10-CM | POA: Diagnosis not present

## 2014-09-20 DIAGNOSIS — Z9981 Dependence on supplemental oxygen: Secondary | ICD-10-CM | POA: Diagnosis not present

## 2014-09-20 DIAGNOSIS — R531 Weakness: Secondary | ICD-10-CM | POA: Diagnosis not present

## 2014-09-24 DIAGNOSIS — Z86711 Personal history of pulmonary embolism: Secondary | ICD-10-CM | POA: Diagnosis not present

## 2014-09-24 DIAGNOSIS — I509 Heart failure, unspecified: Secondary | ICD-10-CM | POA: Diagnosis not present

## 2014-09-24 DIAGNOSIS — R531 Weakness: Secondary | ICD-10-CM | POA: Diagnosis not present

## 2014-09-24 DIAGNOSIS — Z7901 Long term (current) use of anticoagulants: Secondary | ICD-10-CM | POA: Diagnosis not present

## 2014-09-24 DIAGNOSIS — Z9981 Dependence on supplemental oxygen: Secondary | ICD-10-CM | POA: Diagnosis not present

## 2014-09-25 ENCOUNTER — Other Ambulatory Visit (INDEPENDENT_AMBULATORY_CARE_PROVIDER_SITE_OTHER): Payer: Commercial Managed Care - HMO

## 2014-09-25 DIAGNOSIS — I5181 Takotsubo syndrome: Secondary | ICD-10-CM

## 2014-09-25 DIAGNOSIS — I5043 Acute on chronic combined systolic (congestive) and diastolic (congestive) heart failure: Secondary | ICD-10-CM | POA: Diagnosis not present

## 2014-09-25 LAB — BASIC METABOLIC PANEL
BUN: 22 mg/dL (ref 6–23)
CHLORIDE: 97 meq/L (ref 96–112)
CO2: 33 mEq/L — ABNORMAL HIGH (ref 19–32)
CREATININE: 1.21 mg/dL — AB (ref 0.40–1.20)
Calcium: 9.4 mg/dL (ref 8.4–10.5)
GFR: 44.92 mL/min — ABNORMAL LOW (ref >60.00)
Glucose, Bld: 120 mg/dL — ABNORMAL HIGH (ref 70–99)
Potassium: 3.9 mEq/L (ref 3.5–5.1)
Sodium: 138 mEq/L (ref 135–145)

## 2014-09-25 LAB — BRAIN NATRIURETIC PEPTIDE: Pro B Natriuretic peptide (BNP): 2219 pg/mL — ABNORMAL HIGH (ref 0.0–100.0)

## 2014-09-26 DIAGNOSIS — R531 Weakness: Secondary | ICD-10-CM | POA: Diagnosis not present

## 2014-09-26 DIAGNOSIS — Z7901 Long term (current) use of anticoagulants: Secondary | ICD-10-CM | POA: Diagnosis not present

## 2014-09-26 DIAGNOSIS — Z86711 Personal history of pulmonary embolism: Secondary | ICD-10-CM | POA: Diagnosis not present

## 2014-09-26 DIAGNOSIS — Z9981 Dependence on supplemental oxygen: Secondary | ICD-10-CM | POA: Diagnosis not present

## 2014-09-26 DIAGNOSIS — I509 Heart failure, unspecified: Secondary | ICD-10-CM | POA: Diagnosis not present

## 2014-09-27 ENCOUNTER — Telehealth: Payer: Self-pay | Admitting: Cardiology

## 2014-09-27 NOTE — Telephone Encounter (Signed)
Spoke with daughter at length re: pt feeling poorly, not sleeping well at night, fatigue, increased HR this AM, constipation and still coughing up some blood at times. Pt is napping frequently during the day because she is not sleeping well at night.  Advised to attempt to increase activity during the day and limit naps as much as possible.  Pt is participating in PT.  Daughter asked if pt can take Melatonin at bedtime and as advised this is OK.  She can also try Benadryl at bedtime for sleep.  Daughter states pt did not receive AM dose of carvedilol yesterday.  Advised that can be part of the reason her HR is elevated.  Daughter mentions recent constipation.  Advised to increase water and fiber intake and pt can use OTC Senokot as needed.  Dehydration can also be a reason for increased HR as well as constipation.  Reviewed the report of "spitting up blood" with Dr Marlou Porch and daughter was advised this is part of the normal healing process with PE.  She is not coughing large amounts only small streaks.  Daughter states understanding of all that was discussed.  She will call back if further questions or concerns.

## 2014-09-27 NOTE — Telephone Encounter (Signed)
NewMessage  Pt daughter calling to speak w/ RN concerning pt's current condition. Pt daughter stated pt has episodes of "coldness and brief hotness" instances of spitting up blood and overall not feeling well. Pt daughter recorded most recent BP as 118/81 and HR as 101. Please call back and discuss.

## 2014-09-30 DIAGNOSIS — Z7901 Long term (current) use of anticoagulants: Secondary | ICD-10-CM | POA: Diagnosis not present

## 2014-09-30 DIAGNOSIS — Z86711 Personal history of pulmonary embolism: Secondary | ICD-10-CM | POA: Diagnosis not present

## 2014-09-30 DIAGNOSIS — Z9981 Dependence on supplemental oxygen: Secondary | ICD-10-CM | POA: Diagnosis not present

## 2014-09-30 DIAGNOSIS — R531 Weakness: Secondary | ICD-10-CM | POA: Diagnosis not present

## 2014-09-30 DIAGNOSIS — I509 Heart failure, unspecified: Secondary | ICD-10-CM | POA: Diagnosis not present

## 2014-10-01 ENCOUNTER — Ambulatory Visit: Payer: Medicare HMO

## 2014-10-01 DIAGNOSIS — Z9981 Dependence on supplemental oxygen: Secondary | ICD-10-CM | POA: Diagnosis not present

## 2014-10-01 DIAGNOSIS — Z86711 Personal history of pulmonary embolism: Secondary | ICD-10-CM | POA: Diagnosis not present

## 2014-10-01 DIAGNOSIS — R531 Weakness: Secondary | ICD-10-CM | POA: Diagnosis not present

## 2014-10-01 DIAGNOSIS — Z7901 Long term (current) use of anticoagulants: Secondary | ICD-10-CM | POA: Diagnosis not present

## 2014-10-01 DIAGNOSIS — I509 Heart failure, unspecified: Secondary | ICD-10-CM | POA: Diagnosis not present

## 2014-10-02 ENCOUNTER — Other Ambulatory Visit: Payer: Self-pay

## 2014-10-02 ENCOUNTER — Ambulatory Visit (HOSPITAL_COMMUNITY): Payer: Commercial Managed Care - HMO | Attending: Cardiology

## 2014-10-02 DIAGNOSIS — I5043 Acute on chronic combined systolic (congestive) and diastolic (congestive) heart failure: Secondary | ICD-10-CM | POA: Diagnosis not present

## 2014-10-02 DIAGNOSIS — I517 Cardiomegaly: Secondary | ICD-10-CM | POA: Insufficient documentation

## 2014-10-02 DIAGNOSIS — N189 Chronic kidney disease, unspecified: Secondary | ICD-10-CM | POA: Diagnosis not present

## 2014-10-02 DIAGNOSIS — I340 Nonrheumatic mitral (valve) insufficiency: Secondary | ICD-10-CM | POA: Diagnosis not present

## 2014-10-02 DIAGNOSIS — I272 Other secondary pulmonary hypertension: Secondary | ICD-10-CM | POA: Insufficient documentation

## 2014-10-02 DIAGNOSIS — I5181 Takotsubo syndrome: Secondary | ICD-10-CM | POA: Diagnosis not present

## 2014-10-02 DIAGNOSIS — I129 Hypertensive chronic kidney disease with stage 1 through stage 4 chronic kidney disease, or unspecified chronic kidney disease: Secondary | ICD-10-CM | POA: Diagnosis not present

## 2014-10-03 DIAGNOSIS — Z86711 Personal history of pulmonary embolism: Secondary | ICD-10-CM | POA: Diagnosis not present

## 2014-10-03 DIAGNOSIS — I509 Heart failure, unspecified: Secondary | ICD-10-CM | POA: Diagnosis not present

## 2014-10-03 DIAGNOSIS — Z9981 Dependence on supplemental oxygen: Secondary | ICD-10-CM | POA: Diagnosis not present

## 2014-10-03 DIAGNOSIS — R531 Weakness: Secondary | ICD-10-CM | POA: Diagnosis not present

## 2014-10-03 DIAGNOSIS — Z7901 Long term (current) use of anticoagulants: Secondary | ICD-10-CM | POA: Diagnosis not present

## 2014-10-07 ENCOUNTER — Telehealth: Payer: Self-pay | Admitting: Cardiology

## 2014-10-07 NOTE — Telephone Encounter (Signed)
Pt called in stating that she would like to speak with Mickel Baas to see what type of medication she can take for her heart. She would not elaborate on what type of problems she was having she just wanted to speak with her. Please f/u with pt  Thanks

## 2014-10-07 NOTE — Telephone Encounter (Signed)
Left message for [name] that I routed her message to Cecilie Kicks NP:  Pt called in stating that she would like to speak with Mickel Baas to see what type of medication she can take for her heart. She would not elaborate on what type of problems she was having she just wanted to speak with her

## 2014-10-08 ENCOUNTER — Telehealth: Payer: Self-pay | Admitting: Cardiology

## 2014-10-08 DIAGNOSIS — I509 Heart failure, unspecified: Secondary | ICD-10-CM | POA: Diagnosis not present

## 2014-10-08 DIAGNOSIS — R531 Weakness: Secondary | ICD-10-CM | POA: Diagnosis not present

## 2014-10-08 DIAGNOSIS — Z7901 Long term (current) use of anticoagulants: Secondary | ICD-10-CM | POA: Diagnosis not present

## 2014-10-08 DIAGNOSIS — Z86711 Personal history of pulmonary embolism: Secondary | ICD-10-CM | POA: Diagnosis not present

## 2014-10-08 DIAGNOSIS — Z9981 Dependence on supplemental oxygen: Secondary | ICD-10-CM | POA: Diagnosis not present

## 2014-10-08 NOTE — Telephone Encounter (Signed)
Pt c/o BP issue: STAT if pt c/o blurred vision, one-sided weakness or slurred speech  1. What are your last 5 BP readings?     94/60  Pt couldn't give me the list of home recording BP  2. Are you having any other symptoms (ex. Dizziness, headache, blurred vision, passed out)?    Pt gets dizzy when walking  3. What is your BP issue?    low

## 2014-10-08 NOTE — Telephone Encounter (Signed)
error 

## 2014-10-09 ENCOUNTER — Telehealth: Payer: Self-pay | Admitting: Cardiology

## 2014-10-09 DIAGNOSIS — Z9981 Dependence on supplemental oxygen: Secondary | ICD-10-CM | POA: Diagnosis not present

## 2014-10-09 DIAGNOSIS — N189 Chronic kidney disease, unspecified: Secondary | ICD-10-CM | POA: Diagnosis not present

## 2014-10-09 DIAGNOSIS — R531 Weakness: Secondary | ICD-10-CM | POA: Diagnosis not present

## 2014-10-09 DIAGNOSIS — J189 Pneumonia, unspecified organism: Secondary | ICD-10-CM | POA: Diagnosis not present

## 2014-10-09 DIAGNOSIS — I509 Heart failure, unspecified: Secondary | ICD-10-CM | POA: Diagnosis not present

## 2014-10-09 DIAGNOSIS — I10 Essential (primary) hypertension: Secondary | ICD-10-CM | POA: Diagnosis not present

## 2014-10-09 DIAGNOSIS — G4700 Insomnia, unspecified: Secondary | ICD-10-CM | POA: Diagnosis not present

## 2014-10-09 DIAGNOSIS — Z86711 Personal history of pulmonary embolism: Secondary | ICD-10-CM | POA: Diagnosis not present

## 2014-10-09 DIAGNOSIS — Z7901 Long term (current) use of anticoagulants: Secondary | ICD-10-CM | POA: Diagnosis not present

## 2014-10-09 DIAGNOSIS — E039 Hypothyroidism, unspecified: Secondary | ICD-10-CM | POA: Diagnosis not present

## 2014-10-09 DIAGNOSIS — D649 Anemia, unspecified: Secondary | ICD-10-CM | POA: Diagnosis not present

## 2014-10-09 DIAGNOSIS — Z23 Encounter for immunization: Secondary | ICD-10-CM | POA: Diagnosis not present

## 2014-10-09 NOTE — Telephone Encounter (Signed)
Follow up   Claiborne Billings is returning call about message left 10-08-14

## 2014-10-09 NOTE — Telephone Encounter (Signed)
Called, pt back- her PCP saw today and CXR done today.  Her BP is low and per other phone call meds adjusted.  Dr. Hulan Fess, will send his note to OV for tomorrow and send the CXR results.  I talked with Precious Bard the daughter, and with the Pt.

## 2014-10-09 NOTE — Telephone Encounter (Signed)
Spoke with Claiborne Billings, PT, and she states that pt's BP has been running low each time her vitals are checked by either her, OT or the [facility] nurse that comes in. States pt is fatigued, weak and occasionally SOB. Spoke with pt and received permission to speak with daughter Precious Bard. Daughter states that she was there with pt all weekend and BP stayed 90/60's or 80/50's, HR 90-100. Moved pt's appt to tomorrow with Dr. Marlou Porch. Spoke with Dr. Marlou Porch and he said to have pt stop Lisinopril and hold Lasix. Spoke with daughter and she states that pt has taken both of these medications today but they will hold them tomorrow and plan to see Dr. Marlou Porch at [place].

## 2014-10-10 ENCOUNTER — Encounter: Payer: Self-pay | Admitting: Cardiology

## 2014-10-10 ENCOUNTER — Other Ambulatory Visit: Payer: Self-pay | Admitting: Adult Health

## 2014-10-10 ENCOUNTER — Ambulatory Visit (INDEPENDENT_AMBULATORY_CARE_PROVIDER_SITE_OTHER): Payer: Commercial Managed Care - HMO | Admitting: Cardiology

## 2014-10-10 VITALS — BP 128/68 | HR 104 | Ht 64.5 in | Wt 179.4 lb

## 2014-10-10 DIAGNOSIS — I2699 Other pulmonary embolism without acute cor pulmonale: Secondary | ICD-10-CM

## 2014-10-10 DIAGNOSIS — I5023 Acute on chronic systolic (congestive) heart failure: Secondary | ICD-10-CM | POA: Diagnosis not present

## 2014-10-10 DIAGNOSIS — Z7901 Long term (current) use of anticoagulants: Secondary | ICD-10-CM | POA: Insufficient documentation

## 2014-10-10 NOTE — Patient Instructions (Addendum)
Medication Instructions:  STOP LISINOPRIL  CONTINUE YOUR LASIX (FUROSEMIDE) 20 MG DAILY   KEEP ALL OTHER MEDICATIONS THE SAME   Labwork: NONE  Testing/Procedures: NONE  Follow-Up: Your physician recommends that you schedule a follow-up appointment in: 10/17/14 AT 11:15 AM

## 2014-10-10 NOTE — Progress Notes (Addendum)
[facility]. [address], [place]  [address] Phone: [phone] Fax:  [phone]  Date:  10/10/2014   ID:  Alexandra [name], DOB 1927/05/14, MRN [id number]  PCP:   Melinda Crutch, MD   History of Present Illness: Alexandra [name] is a 81 y.o. female here for follow-up of systolic heart failure. Echo on 08/15/14 showed EF 20%. There were thoughts that she may have stress induced cardiomyopathy. Previously in 2013 she had left bundle branch block, EF was low normal. She had pulmonary embolism. Multiple emboli noted in bilateral DVT, on Xarelto. The cardiomyopathy discovery surrounded the death of her husband after several months of hospice.  Recently called in with low blood pressures 80s over 50s, heart rate 90-100. I had her stop her lisinopril and hold her Lasix. She then saw Dr. Rex Kras. Chest x-ray performed. Her blood pressure was improved at 100/62 but still quite low.  A repeat echo on 10/02/14 showed markedly reduced ejection fraction of 15% with diffuse hypokinesis.  She has been experiencing difficulty laying flat, orthopnea, sitting up in recliner to sleep. She feels awful. Nausea, dyspnea, other HF symptoms.   Creatinine was 1.28, potassium 4.2, BNP 1760 still elevated, hemoglobin 11.2 mildly reduced TSH is normal. Her weight is down significant weight from 194-179.     She had a previous hospitalization in December 2014 for pneumonia and was noted to have an intermittent left bundle branch block.  She had a stress test that was negative in 2013. Echo in 2013 showed ejection fraction of 50-55%   Wt Readings from Last 3 Encounters:  10/10/14 179 lb 6.4 oz (81.375 kg)  09/19/14 194 lb (87.998 kg)  09/18/14 194 lb (87.998 kg)     Past Medical History  Diagnosis Date  . Arthritis   . Headache(784.0)   . Anxiety   . Depression   . Asthma   . CAP (community acquired pneumonia) 12/19/2012  . Intermittent LBBB (left bundle branch block) 12/21/2012  . Obesity     . Peripheral neuropathy   . Sacroiliitis, not elsewhere classified   . Osteopenia   . Hypercholesteremia   . Neuropathy   . Trigger finger     [name]  . Chest pain     NM stress test 05/2011 Normal  . DOE (dyspnea on exertion)     No SOB at rest  . Fatigue     excertional  . breast ca dx'd 2000    left  . Essential (primary) hypertension 04/02/2014  . Adult hypothyroidism 04/02/2014  . Hereditary and idiopathic peripheral neuropathy 07/02/2014  . Leg weakness   . CKD (chronic kidney disease) stage 3, GFR 30-59 ml/min 08/13/2014  . Anemia of chronic disease 08/13/2014  . CHF (congestive heart failure) 08/19/2014    Past Surgical History  Procedure Laterality Date  . Leg surgery    . Breast lumpectomy      breast cancer  . Lumbar laminectomy    . Achilles tendon surgery    . Tonsillectomy    . Vesicovaginal fistula closure w/ tah      DC hysterectomy  . Polypectomy    . Cholecystectomy N/A 08/13/2014    Procedure: LAPAROSCOPIC CHOLECYSTECTOMY;  Surgeon: Stark Klein, MD;  Location: [facility];  Service: General;  Laterality: N/A;    Current Outpatient Prescriptions  Medication Sig Dispense Refill  . alendronate (FOSAMAX) 70 MG tablet Take 70 mg by mouth every Saturday. Take with a full glass  of water on an empty stomach.    . ALPRAZolam (XANAX) 0.25 MG tablet Take 0.25 mg by mouth 3 (three) times daily as needed for anxiety.    Marland Kitchen aspirin EC 81 MG tablet Take 81 mg by mouth daily.    Marland Kitchen b complex vitamins tablet Take 1 tablet by mouth daily.    . Calcium-Vitamin D (CALTRATE 600 PLUS-VIT D PO) Take 1 tablet by mouth 2 (two) times daily.    . carvedilol (COREG) 6.25 MG tablet TK 1 T PO BID  0  . cholecalciferol (VITAMIN D) 1000 UNITS tablet Take 1,000 Units by mouth daily.    . furosemide (LASIX) 20 MG tablet Take 20 mg by mouth daily.    Marland Kitchen levothyroxine (SYNTHROID, LEVOTHROID) 25 MCG tablet Take one tablet by mouth once daily in the morning before breakfast for thyroid    .  lisinopril (PRINIVIL,ZESTRIL) 2.5 MG tablet Take 0.5 tablets (1.25 mg total) by mouth daily. 15 tablet 6  . Multiple Vitamin (MULITIVITAMIN WITH MINERALS) TABS Take 1 tablet by mouth daily.    . nitroGLYCERIN (NITROSTAT) 0.4 MG SL tablet Place 0.4 mg under the tongue every 5 (five) minutes as needed for chest pain.    Marland Kitchen oxyCODONE-acetaminophen (PERCOCET/ROXICET) 5-325 MG per tablet Take 1 tablet by mouth every 6 (six) hours as needed for severe pain. 10 tablet 0  . pantoprazole (PROTONIX) 40 MG tablet Take 40 mg by mouth 2 (two) times daily.    . potassium chloride (K-DUR) 10 MEQ tablet Take 1 tablet (10 mEq total) by mouth daily as needed (to take with Lasix). 30 tablet 0  . promethazine (PHENERGAN) 25 MG tablet Take one tablet by mouth every 8 hours as needed for nausea    . rivaroxaban (XARELTO) 20 MG TABS tablet Take 1 tablet (20 mg total) by mouth daily with supper. (Patient not taking: Reported on 10/10/2014) 30 tablet   . traMADol (ULTRAM) 50 MG tablet Take 1 tablet (50 mg total) by mouth every 6 (six) hours as needed for moderate pain. 60 tablet 3  . VENTOLIN HFA 108 (90 BASE) MCG/ACT inhaler INhaLe 2 PufFS by mouth every 4 hours as needed for shortness of breath or wheezing  3   No current facility-administered medications for this visit.    Allergies:    Allergies  Allergen Reactions  . Cymbalta [Duloxetine Hcl] Other (See Comments)    Depression  . Lyrica [Pregabalin] Other (See Comments)    Blurry vision    Social History:  The patient  reports that she has never smoked. She has never used smokeless tobacco. She reports that she does not drink alcohol or use illicit drugs.   Family History  Problem Relation Age of Onset  . Diabetes Brother   . Alzheimer's disease Sister   . CVA Daughter     01/21/09  . Pancreatic cancer Father 78  . Pneumonia Mother 102    cause of death  . Heart attack Neg Hx   . Stroke Daughter   . Hypertension Daughter     ROS:  Please see the  history of present illness.  Denies syncope. Denies any fevers, chills, orthopnea, PND   All other systems reviewed and negative.   PHYSICAL EXAM: VS:  BP 128/68 mmHg  Pulse 104  Ht 5' 4.5" (1.638 m)  Wt 179 lb 6.4 oz (81.375 kg)  BMI 30.33 kg/m2  SpO2 99% Well nourished, sitting in wheelchair, home oxygen, no significant increased work of breathing at rest  HEENT: normal, [place]/AT, EOMI Neck: + JVD upper neck, normal carotid upstroke, no bruit Cardiac:  normal S1, S2; RRR +S3; 1/6 S apical murmur Lungs:  Blunted bases, no wheezing, rhonchi or rales Abd: soft, nontender, no hepatomegaly, no bruits Ext: no edema, 2+ distal pulses Skin: cool and dry GU: deferred Neuro: no focal abnormalities noted, AAO x 3  EKG:  Sinus rhythm rate 91 with no other abnormalities   Prior hospital records reviewed. Prior stress test, echocardiogram reviewed, lab work reviewed.   ASSESSMENT AND PLAN:  1. 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 She has completed nearly 2 months of therapy. Clinically, she has struggled over these past several days. Her low-dose lisinopril has been held secondary to hypotension. We have resumed her Lasix 20 mg. In looking at her echo, she has a global decrease in function including RV dysfunction as well. Her CVP is elevated. Her CT scan, personally reviewed, shows some coronary calcification but not a significant amount. Realistically, if she had severe triple-vessel coronary disease, she would not be a candidate for bypass surgery and there is a likelihood that she has a nonischemic cardiomyopathy at this point. I have discussed with Dr. Haroldine Laws. If she and her family are willing, we could direct admit her tomorrow to the heart failure service, place PICC line and provide with inotropic,  milrinone to improve cardiac output and to help decongest her overall hopefully help her current symptoms. If necessary, we could proceed with left and right heart catheterization. She would need to hold her Xarelto one dose prior. I spoke to her son Wille Glaser, he will discuss this with her. She family understand the severity of the situation, increased morbidity, mortality. If aggressive palliative measures are desired, we will help facilitate hospice care. Her husband was on hospice prior to his death.  2. Pulmonary embolism-currently on Xarelto. We will stop aspirin 81 mg. 3. DVT bilateral-Xarelto 4. We will arbitrarily set up follow-up in one week. Further discussion with colleagues.   Signed, Candee Furbish, MD [facility]  10/10/2014 11:58 AM    Addendum: Talked to Joe, her son, again about hospitalization and they are amenable to plan. Will direct admit to CHF service on Friday Sept 23. Discussed PICC line, CVP, Co-ox, Milrinone (potential adverse effects). IF needed in the future, one could consider R and L heart cath (would need to hold Xarelto night before). She is amenable to plan. Ultimately she states she just wants to feel better. I appreciate Dr. Haroldine Laws and CHF team assistance.  Candee Furbish, MD

## 2014-10-10 NOTE — Telephone Encounter (Signed)
Has this been taken care of?

## 2014-10-11 ENCOUNTER — Inpatient Hospital Stay (HOSPITAL_COMMUNITY)
Admission: AD | Admit: 2014-10-11 | Discharge: 2014-10-15 | DRG: 292 | Disposition: A | Discharge status: Home Health | Payer: Commercial Managed Care - HMO | Source: Ambulatory Visit | Attending: Internal Medicine | Admitting: Internal Medicine

## 2014-10-11 ENCOUNTER — Encounter (HOSPITAL_COMMUNITY): Payer: Self-pay | Admitting: *Deleted

## 2014-10-11 DIAGNOSIS — F419 Anxiety disorder, unspecified: Secondary | ICD-10-CM | POA: Diagnosis present

## 2014-10-11 DIAGNOSIS — R570 Cardiogenic shock: Secondary | ICD-10-CM | POA: Diagnosis not present

## 2014-10-11 DIAGNOSIS — Z86711 Personal history of pulmonary embolism: Secondary | ICD-10-CM

## 2014-10-11 DIAGNOSIS — Z90710 Acquired absence of both cervix and uterus: Secondary | ICD-10-CM | POA: Diagnosis not present

## 2014-10-11 DIAGNOSIS — N183 Chronic kidney disease, stage 3 (moderate): Secondary | ICD-10-CM | POA: Diagnosis not present

## 2014-10-11 DIAGNOSIS — Z86718 Personal history of other venous thrombosis and embolism: Secondary | ICD-10-CM | POA: Diagnosis not present

## 2014-10-11 DIAGNOSIS — I471 Supraventricular tachycardia: Secondary | ICD-10-CM | POA: Diagnosis present

## 2014-10-11 DIAGNOSIS — F329 Major depressive disorder, single episode, unspecified: Secondary | ICD-10-CM | POA: Diagnosis present

## 2014-10-11 DIAGNOSIS — Z888 Allergy status to other drugs, medicaments and biological substances status: Secondary | ICD-10-CM

## 2014-10-11 DIAGNOSIS — R51 Headache: Secondary | ICD-10-CM | POA: Diagnosis not present

## 2014-10-11 DIAGNOSIS — F4321 Adjustment disorder with depressed mood: Secondary | ICD-10-CM | POA: Insufficient documentation

## 2014-10-11 DIAGNOSIS — I5023 Acute on chronic systolic (congestive) heart failure: Secondary | ICD-10-CM | POA: Diagnosis not present

## 2014-10-11 DIAGNOSIS — I129 Hypertensive chronic kidney disease with stage 1 through stage 4 chronic kidney disease, or unspecified chronic kidney disease: Secondary | ICD-10-CM | POA: Diagnosis not present

## 2014-10-11 DIAGNOSIS — J45909 Unspecified asthma, uncomplicated: Secondary | ICD-10-CM | POA: Diagnosis present

## 2014-10-11 DIAGNOSIS — Y92239 Unspecified place in hospital as the place of occurrence of the external cause: Secondary | ICD-10-CM

## 2014-10-11 DIAGNOSIS — Z79899 Other long term (current) drug therapy: Secondary | ICD-10-CM | POA: Diagnosis not present

## 2014-10-11 DIAGNOSIS — G609 Hereditary and idiopathic neuropathy, unspecified: Secondary | ICD-10-CM | POA: Diagnosis present

## 2014-10-11 DIAGNOSIS — R5383 Other fatigue: Secondary | ICD-10-CM | POA: Diagnosis present

## 2014-10-11 DIAGNOSIS — D638 Anemia in other chronic diseases classified elsewhere: Secondary | ICD-10-CM | POA: Diagnosis not present

## 2014-10-11 DIAGNOSIS — R519 Headache, unspecified: Secondary | ICD-10-CM | POA: Insufficient documentation

## 2014-10-11 DIAGNOSIS — Z9049 Acquired absence of other specified parts of digestive tract: Secondary | ICD-10-CM | POA: Diagnosis present

## 2014-10-11 DIAGNOSIS — Z7901 Long term (current) use of anticoagulants: Secondary | ICD-10-CM | POA: Diagnosis not present

## 2014-10-11 DIAGNOSIS — I447 Left bundle-branch block, unspecified: Secondary | ICD-10-CM | POA: Diagnosis not present

## 2014-10-11 DIAGNOSIS — R0602 Shortness of breath: Secondary | ICD-10-CM | POA: Diagnosis not present

## 2014-10-11 DIAGNOSIS — M8580 Other specified disorders of bone density and structure, unspecified site: Secondary | ICD-10-CM | POA: Diagnosis present

## 2014-10-11 DIAGNOSIS — Z6828 Body mass index (BMI) 28.0-28.9, adult: Secondary | ICD-10-CM

## 2014-10-11 DIAGNOSIS — T462X5A Adverse effect of other antidysrhythmic drugs, initial encounter: Secondary | ICD-10-CM | POA: Diagnosis not present

## 2014-10-11 DIAGNOSIS — I5043 Acute on chronic combined systolic (congestive) and diastolic (congestive) heart failure: Secondary | ICD-10-CM | POA: Diagnosis not present

## 2014-10-11 DIAGNOSIS — I2699 Other pulmonary embolism without acute cor pulmonale: Secondary | ICD-10-CM | POA: Diagnosis not present

## 2014-10-11 DIAGNOSIS — Z853 Personal history of malignant neoplasm of breast: Secondary | ICD-10-CM | POA: Diagnosis not present

## 2014-10-11 DIAGNOSIS — I429 Cardiomyopathy, unspecified: Secondary | ICD-10-CM | POA: Diagnosis not present

## 2014-10-11 DIAGNOSIS — E039 Hypothyroidism, unspecified: Secondary | ICD-10-CM | POA: Diagnosis present

## 2014-10-11 DIAGNOSIS — E669 Obesity, unspecified: Secondary | ICD-10-CM | POA: Diagnosis present

## 2014-10-11 DIAGNOSIS — M1990 Unspecified osteoarthritis, unspecified site: Secondary | ICD-10-CM | POA: Diagnosis not present

## 2014-10-11 DIAGNOSIS — J189 Pneumonia, unspecified organism: Secondary | ICD-10-CM | POA: Diagnosis not present

## 2014-10-11 HISTORY — DX: Adjustment disorder with depressed mood: F43.21

## 2014-10-11 LAB — CBC WITH DIFFERENTIAL/PLATELET
BASOS ABS: 0 10*3/uL (ref 0.0–0.1)
Basophils Relative: 0 %
Eosinophils Absolute: 0.1 10*3/uL (ref 0.0–0.7)
Eosinophils Relative: 2 %
HEMATOCRIT: 33 % — AB (ref 36.0–46.0)
HEMOGLOBIN: 10.2 g/dL — AB (ref 12.0–15.0)
LYMPHS PCT: 29 %
Lymphs Abs: 2 10*3/uL (ref 0.7–4.0)
MCH: 27.4 pg (ref 26.0–34.0)
MCHC: 30.9 g/dL (ref 30.0–36.0)
MCV: 88.7 fL (ref 78.0–100.0)
Monocytes Absolute: 0.4 10*3/uL (ref 0.1–1.0)
Monocytes Relative: 5 %
NEUTROS ABS: 4.4 10*3/uL (ref 1.7–7.7)
NEUTROS PCT: 64 %
Platelets: 185 10*3/uL (ref 150–400)
RBC: 3.72 MIL/uL — AB (ref 3.87–5.11)
RDW: 18.1 % — ABNORMAL HIGH (ref 11.5–15.5)
WBC: 6.8 10*3/uL (ref 4.0–10.5)

## 2014-10-11 LAB — COMPREHENSIVE METABOLIC PANEL
ALBUMIN: 3.7 g/dL (ref 3.5–5.0)
ALT: 29 U/L (ref 14–54)
ANION GAP: 8 (ref 5–15)
AST: 40 U/L (ref 15–41)
Alkaline Phosphatase: 39 U/L (ref 38–126)
BILIRUBIN TOTAL: 0.9 mg/dL (ref 0.3–1.2)
BUN: 22 mg/dL — ABNORMAL HIGH (ref 6–20)
CO2: 26 mmol/L (ref 22–32)
Calcium: 9.6 mg/dL (ref 8.9–10.3)
Chloride: 104 mmol/L (ref 101–111)
Creatinine, Ser: 1.2 mg/dL — ABNORMAL HIGH (ref 0.44–1.00)
GFR calc Af Amer: 46 mL/min — ABNORMAL LOW (ref >60)
GFR, EST NON AFRICAN AMERICAN: 40 mL/min — AB (ref >60)
GLUCOSE: 111 mg/dL — AB (ref 65–99)
POTASSIUM: 4.1 mmol/L (ref 3.5–5.1)
Sodium: 138 mmol/L (ref 135–145)
TOTAL PROTEIN: 7.8 g/dL (ref 6.5–8.1)

## 2014-10-11 LAB — TROPONIN I
TROPONIN I: 0.03 ng/mL (ref <0.031)
Troponin I: 0.03 ng/mL (ref <0.031)

## 2014-10-11 LAB — MAGNESIUM: MAGNESIUM: 1.8 mg/dL (ref 1.7–2.4)

## 2014-10-11 LAB — CARBOXYHEMOGLOBIN
Carboxyhemoglobin: 1.3 % (ref 0.5–1.5)
Methemoglobin: 0.8 % (ref 0.0–1.5)
O2 Saturation: 60.6 %
TOTAL HEMOGLOBIN: 9.7 g/dL — AB (ref 12.0–16.0)

## 2014-10-11 LAB — MRSA PCR SCREENING: MRSA BY PCR: POSITIVE — AB

## 2014-10-11 LAB — BRAIN NATRIURETIC PEPTIDE: B NATRIURETIC PEPTIDE 5: 2298.4 pg/mL — AB (ref 0.0–100.0)

## 2014-10-11 LAB — PROTIME-INR
INR: 2.07 — ABNORMAL HIGH (ref 0.00–1.49)
Prothrombin Time: 23.2 seconds — ABNORMAL HIGH (ref 11.6–15.2)

## 2014-10-11 MED ORDER — SPIRONOLACTONE 25 MG PO TABS
12.5000 mg | ORAL_TABLET | Freq: Every day | ORAL | Status: DC
  Administered 2014-10-11: 12.5 mg via ORAL
  Filled 2014-10-11: qty 1

## 2014-10-11 MED ORDER — ONDANSETRON HCL 4 MG/2ML IJ SOLN
4.0000 mg | Freq: Four times a day (QID) | INTRAMUSCULAR | Status: DC | PRN
Start: 2014-10-11 — End: 2014-10-15
  Administered 2014-10-14: 4 mg via INTRAVENOUS
  Filled 2014-10-11: qty 2

## 2014-10-11 MED ORDER — RIVAROXABAN 20 MG PO TABS
20.0000 mg | ORAL_TABLET | Freq: Every day | ORAL | Status: DC
  Administered 2014-10-11 – 2014-10-15 (×5): 20 mg via ORAL
  Filled 2014-10-11 (×6): qty 1

## 2014-10-11 MED ORDER — VITAMIN D 1000 UNITS PO TABS
1000.0000 [IU] | ORAL_TABLET | Freq: Every day | ORAL | Status: DC
  Administered 2014-10-12 – 2014-10-15 (×4): 1000 [IU] via ORAL
  Filled 2014-10-11 (×5): qty 1

## 2014-10-11 MED ORDER — SODIUM CHLORIDE 0.9 % IJ SOLN
3.0000 mL | Freq: Two times a day (BID) | INTRAMUSCULAR | Status: DC
  Administered 2014-10-11 – 2014-10-15 (×4): 3 mL via INTRAVENOUS

## 2014-10-11 MED ORDER — ALBUTEROL SULFATE (2.5 MG/3ML) 0.083% IN NEBU: 2.5000 mg | INHALATION_SOLUTION | RESPIRATORY_TRACT | Status: DC | PRN

## 2014-10-11 MED ORDER — CHLORHEXIDINE GLUCONATE CLOTH 2 % EX PADS
6.0000 | MEDICATED_PAD | Freq: Every day | CUTANEOUS | Status: DC
  Administered 2014-10-12 – 2014-10-15 (×4): 6 via TOPICAL

## 2014-10-11 MED ORDER — ALPRAZOLAM 0.25 MG PO TABS
0.2500 mg | ORAL_TABLET | Freq: Three times a day (TID) | ORAL | Status: DC | PRN
  Administered 2014-10-12 – 2014-10-15 (×4): 0.25 mg via ORAL
  Filled 2014-10-11 (×4): qty 1

## 2014-10-11 MED ORDER — PANTOPRAZOLE SODIUM 40 MG PO TBEC
40.0000 mg | DELAYED_RELEASE_TABLET | Freq: Two times a day (BID) | ORAL | Status: DC
  Administered 2014-10-11 – 2014-10-15 (×8): 40 mg via ORAL
  Filled 2014-10-11 (×9): qty 1

## 2014-10-11 MED ORDER — LEVOTHYROXINE SODIUM 50 MCG PO TABS
25.0000 ug | ORAL_TABLET | Freq: Every day | ORAL | Status: DC
  Administered 2014-10-12 – 2014-10-15 (×3): 25 ug via ORAL
  Filled 2014-10-11 (×4): qty 1

## 2014-10-11 MED ORDER — ACETAMINOPHEN 325 MG PO TABS
650.0000 mg | ORAL_TABLET | ORAL | Status: DC | PRN
  Administered 2014-10-12 – 2014-10-15 (×5): 650 mg via ORAL
  Filled 2014-10-11 (×6): qty 2

## 2014-10-11 MED ORDER — POTASSIUM CHLORIDE CRYS ER 10 MEQ PO TBCR
10.0000 meq | EXTENDED_RELEASE_TABLET | Freq: Every day | ORAL | Status: DC
  Administered 2014-10-12 – 2014-10-15 (×4): 10 meq via ORAL
  Filled 2014-10-11 (×5): qty 1

## 2014-10-11 MED ORDER — TRAMADOL HCL 50 MG PO TABS
50.0000 mg | ORAL_TABLET | Freq: Four times a day (QID) | ORAL | Status: DC | PRN
  Administered 2014-10-12: 50 mg via ORAL
  Filled 2014-10-11 (×2): qty 1

## 2014-10-11 MED ORDER — SODIUM CHLORIDE 0.9 % IJ SOLN
10.0000 mL | Freq: Two times a day (BID) | INTRAMUSCULAR | Status: DC
  Administered 2014-10-11 – 2014-10-12 (×2): 10 mL
  Administered 2014-10-13: 20 mL
  Administered 2014-10-13 – 2014-10-15 (×4): 10 mL

## 2014-10-11 MED ORDER — SODIUM CHLORIDE 0.9 % IJ SOLN: 10.0000 mL | INTRAMUSCULAR | Status: DC | PRN

## 2014-10-11 MED ORDER — ADULT MULTIVITAMIN W/MINERALS CH
1.0000 | ORAL_TABLET | Freq: Every day | ORAL | Status: DC
  Administered 2014-10-12 – 2014-10-15 (×4): 1 via ORAL
  Filled 2014-10-11 (×5): qty 1

## 2014-10-11 MED ORDER — FUROSEMIDE 10 MG/ML IJ SOLN
80.0000 mg | Freq: Two times a day (BID) | INTRAMUSCULAR | Status: DC
  Administered 2014-10-11 – 2014-10-12 (×2): 80 mg via INTRAVENOUS
  Filled 2014-10-11 (×3): qty 8

## 2014-10-11 MED ORDER — HEPARIN (PORCINE) IN NACL 100-0.45 UNIT/ML-% IJ SOLN
INTRAMUSCULAR | Status: AC
  Filled 2014-10-11: qty 250

## 2014-10-11 MED ORDER — MUPIROCIN 2 % EX OINT
1.0000 "application " | TOPICAL_OINTMENT | Freq: Two times a day (BID) | CUTANEOUS | Status: DC
  Administered 2014-10-11 – 2014-10-15 (×8): 1 via NASAL
  Filled 2014-10-11: qty 22

## 2014-10-11 MED ORDER — OXYCODONE-ACETAMINOPHEN 5-325 MG PO TABS
1.0000 | ORAL_TABLET | Freq: Four times a day (QID) | ORAL | Status: DC | PRN
  Administered 2014-10-13: 1 via ORAL
  Filled 2014-10-11: qty 1

## 2014-10-11 MED ORDER — FUROSEMIDE 20 MG PO TABS: 20.0000 mg | ORAL_TABLET | Freq: Every day | ORAL | Status: DC

## 2014-10-11 MED ORDER — SODIUM CHLORIDE 0.9 % IJ SOLN: 3.0000 mL | INTRAMUSCULAR | Status: DC | PRN

## 2014-10-11 MED ORDER — SODIUM CHLORIDE 0.9 % IV SOLN: 250.0000 mL | INTRAVENOUS | Status: DC | PRN

## 2014-10-11 NOTE — Progress Notes (Signed)
IV team made aware about the  PICC line that needs to be inserted as soon as possible as per MD.

## 2014-10-11 NOTE — Care Management Note (Signed)
Case Management Note  Patient Details  Name: Alexandra [name] MRN: [id number] Date of Birth: 07-08-1927  Subjective/Objective:    Pt lives alone but adult children have been taking turns staying with her.  [facility] is active with RN, PT, and OT and pt wishes to resume services when she is medically stable for discharge.  [facility] liaison notified of admission.               Action/Plan: CM will continue to follow for discharge needs.          Expected Discharge Plan:  [place]  In-House Referral:     Discharge planning Services  CM Consult  Post Acute Care Choice:  Resumption of Svcs/PTA Provider  HH Arranged:  RN, PT, OT [facility] Agency:  [facility]  Status of Service:  In process, will continue to follow  Girard Cooter, RN 10/11/2014, 3:40 PM

## 2014-10-11 NOTE — Progress Notes (Signed)
ANTICOAGULATION CONSULT NOTE - Initial Consult  Pharmacy Consult for rivaroxaban  Indication: Hx of PE/ multiple DVT  Allergies  Allergen Reactions  . Cymbalta [Duloxetine Hcl] Other (See Comments)    Depression  . Lyrica [Pregabalin] Other (See Comments)    Blurry vision    Patient Measurements: Height: 5' 4.5" (163.8 cm) Weight: 179 lb 14.3 oz (81.6 kg) IBW/kg (Calculated) : 55.85   Vital Signs: Temp: 98.4 F (36.9 C) (09/23 1315) Temp Source: Oral (09/23 1315) BP: 122/63 mmHg (09/23 1500) Pulse Rate: 99 (09/23 1500)  Labs:  Recent Labs  10/11/14 1459  HGB 10.2*  HCT 33.0*  PLT 185  LABPROT 23.2*  INR 2.07*    Estimated Creatinine Clearance: 35.5 mL/min (by C-G formula based on Cr of 1.21).   Medical History: Past Medical History  Diagnosis Date  . Arthritis   . Headache(784.0)   . Anxiety   . Depression   . Asthma   . CAP (community acquired pneumonia) 12/19/2012  . Intermittent LBBB (left bundle branch block) 12/21/2012  . Obesity   . Peripheral neuropathy   . Sacroiliitis, not elsewhere classified   . Osteopenia   . Hypercholesteremia   . Neuropathy   . Trigger finger     [name]  . Chest pain     NM stress test 05/2011 Normal  . DOE (dyspnea on exertion)     No SOB at rest  . Fatigue     excertional  . breast ca dx'd 2000    left  . Essential (primary) hypertension 04/02/2014  . Adult hypothyroidism 04/02/2014  . Hereditary and idiopathic peripheral neuropathy 07/02/2014  . Leg weakness   . CKD (chronic kidney disease) stage 3, GFR 30-59 ml/min 08/13/2014  . Anemia of chronic disease 08/13/2014  . CHF (congestive heart failure) 08/19/2014     Assessment: 87yof with hx PE/multiple DVT admitted with HF exacerbation and low BP.  Plan to place PICC line and evaluate CO and volume status.   AC pta rivaroxaban 20mg  daily - dose appropriate for VTE, CBC stable.  Last dose 9/22 PTA will restart tonight after PICC line placed.  Goal of Therapy:    Monitor platelets by anticoagulation protocol: Yes   Plan:  Continue home dose Rivaroxaban 20mg  daily Monitor CBC, renal function, s/s bleeding  Bonnita Nasuti Pharm.D. CPP, BCPS Clinical Pharmacist [phone] 10/11/2014 4:09 PM

## 2014-10-11 NOTE — H&P (Signed)
Advanced Heart Failure Team History and Physical Note   Primary Physician:   Melinda Crutch, MD Primary Cardiologist:  Dr. Candee Furbish HF: (New) Dr. Haroldine Laws   Reason for Admission: Low output HF   HPI:    Alexandra [name] is a 81 y.o. 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   Previously in 2013 she had left bundle branch block, EF was low normal. Discharge weight was 196.  She was admitted in July 2016 for Abdominal pain and SOB. Her Gallbladder was thought to be causing her pain, so a lap choley was performed that admission. Echo on 08/15/14 showed EF 20%. There were thoughts that she may have stress induced CMP due to the death of her husband in 05-17-22 after several months of hospice care.   A repeat echo on 10/02/14 showed markedly reduced ejection fraction of 15% with diffuse hypokinesis.  Recently called in to Dr Marlou Porch office with hypotension 80s over 69s, heart rate 90-100. Lisinopril and lasix were stopped. She was seen in his office on 10/10/14 and thought to be in low output HF with worsening fatigue, DOE, and orthopnea. Dr Marlou Porch had long discussion with family and they do not want to seek palliative care at this point, but would not necessarily like to be very aggressive (No transplant/VAD).  They agreed on having her directly admitted and evaluated by the HF team for low output.   She says that she has been feeling worse overall over the past 1-2 months.  She is not SOB at rest, but becomes so with minimal exertion such as getting ready in the mornings.  Her primary complaint is lack of energy. + Orthopnea and sleeps in a recliner. She is not lightheaded or dizzy. She states that her fluid has been stable on her current dose of lasix. She has also been  having Nausea and abdominal pain as far back as June, that was initially thought to be her Gallbladder, which was removed in July. Her weight has been trending down over the last 1-2 months as well, from 194 to 179 at home.   ECHO 10/02/14 LVEF 15% with diffuse hypokinesis, RV mildly dilated, normal function, PA peak pressure 59 mmHg   Review of Systems: [y] = yes, [ ]  = no   General: Weight gain [ ] ; Weight loss [y]; Anorexia [ ] ; Fatigue [y]; Fever [ ] ; Chills [ ] ; Weakness [y]  Cardiac: Chest pain/pressure [y]; Resting SOB [ ] ; Exertional SOB [y]; Orthopnea [y]; Pedal Edema [ ] ; Palpitations [ ] ; Syncope [ ] ; Presyncope [ ] ; Paroxysmal nocturnal dyspnea[ ]   Pulmonary: Cough [ ] ; Wheezing[ ] ; Hemoptysis[ ] ; Sputum [ ] ; Snoring [ ]   GI: Vomiting[ ] ; Dysphagia[ ] ; Melena[ ] ; Hematochezia [ ] ; Heartburn[ ] ; Abdominal pain [ ] ; Constipation [ ] ; Diarrhea [ ] ; BRBPR [ ]   GU: Hematuria[ ] ; Dysuria [ ] ; Nocturia[ ]   Vascular: Pain in legs with walking [ ] ; Pain in feet with lying flat [ ] ; Non-healing sores [ ] ; Stroke [ ] ; TIA [ ] ; Slurred speech [ ] ;  Neuro: Headaches[ ] ; Vertigo[ ] ; Seizures[ ] ; Paresthesias[ ] ;Blurred vision [ ] ; Diplopia [ ] ; Vision changes [ ]   Ortho/Skin: Arthritis [y]; Joint pain [y]; Muscle pain [ ] ; Joint swelling [ ] ;  Back Pain [ ] ; Rash [ ]   Psych: Depression[ ] ; Anxiety[ ]   Heme: Bleeding problems [ ] ; Clotting disorders [ ] ; Anemia [ ]   Endocrine: Diabetes [ ] ; Thyroid dysfunction[ ]     Home Medications Prior to Admission medications   Medication Sig Start Date End Date Taking? Authorizing Provider  alendronate (FOSAMAX) 70 MG tablet Take 70 mg by mouth every Saturday. Take with a full glass of water on an empty stomach.    Historical Provider, MD  ALPRAZolam Duanne Moron) 0.25 MG tablet Take 0.25 mg by mouth 3 (three) times daily as needed for anxiety.    Historical Provider, MD  b complex vitamins tablet Take 1 tablet by mouth daily.    Historical Provider, MD   Calcium-Vitamin D (CALTRATE 600 PLUS-VIT D PO) Take 1 tablet by mouth 2 (two) times daily.    Historical Provider, MD  carvedilol (COREG) 6.25 MG tablet TK 1 T PO BID 09/19/14   Historical Provider, MD  cholecalciferol (VITAMIN D) 1000 UNITS tablet Take 1,000 Units by mouth daily.    Historical Provider, MD  furosemide (LASIX) 20 MG tablet Take 20 mg by mouth daily.    Historical Provider, MD  levothyroxine (SYNTHROID, LEVOTHROID) 25 MCG tablet Take one tablet by mouth once daily in the morning before breakfast for thyroid    Historical Provider, MD  Multiple Vitamin (MULITIVITAMIN WITH MINERALS) TABS Take 1 tablet by mouth daily.    Historical Provider, MD  nitroGLYCERIN (NITROSTAT) 0.4 MG SL tablet Place 0.4 mg under the tongue every 5 (five) minutes as needed for chest pain.    Historical Provider, MD  oxyCODONE-acetaminophen (PERCOCET/ROXICET) 5-325 MG per tablet Take 1 tablet by mouth every 6 (six) hours as needed for severe pain. 08/23/14   Charlynne Cousins, MD  pantoprazole (PROTONIX) 40 MG tablet Take 40 mg by mouth 2 (two) times daily.    Historical Provider, MD  potassium chloride (K-DUR) 10 MEQ tablet Take 1 tablet (10 mEq total) by mouth daily as needed (to take with Lasix). 08/17/14   Debbe Odea, MD  promethazine (PHENERGAN) 25 MG tablet Take one tablet by mouth every 8 hours as needed for nausea    Historical Provider, MD  rivaroxaban (XARELTO) 20 MG TABS tablet Take 1 tablet (20 mg total) by mouth daily with supper. Patient not taking: Reported on 10/10/2014 09/12/14   Charlynne Cousins, MD  traMADol (ULTRAM) 50 MG tablet Take 1 tablet (50 mg total) by mouth every 6 (six) hours as needed for moderate pain. 08/23/14   Charlynne Cousins, MD  VENTOLIN HFA 108 (90 BASE) MCG/ACT inhaler INhaLe 2 PufFS by mouth every 4 hours as needed for shortness of breath or wheezing 07/19/14   Historical Provider, MD    Past Medical History: Past Medical History  Diagnosis Date  . Arthritis   .  Headache(784.0)   . Anxiety   . Depression   . Asthma   . CAP (community acquired pneumonia) 12/19/2012  . Intermittent LBBB (left bundle branch block) 12/21/2012  . Obesity   . Peripheral neuropathy   . Sacroiliitis, not elsewhere classified   . Osteopenia   . Hypercholesteremia   . Neuropathy   . Trigger finger     [name]  . Chest pain     NM stress test 05/2011 Normal  . DOE (dyspnea on exertion)     No SOB at rest  . Fatigue     excertional  . breast ca dx'd 2000  left  . Essential (primary) hypertension 04/02/2014  . Adult hypothyroidism 04/02/2014  . Hereditary and idiopathic peripheral neuropathy 07/02/2014  . Leg weakness   . CKD (chronic kidney disease) stage 3, GFR 30-59 ml/min 08/13/2014  . Anemia of chronic disease 08/13/2014  . CHF (congestive heart failure) 08/19/2014    Past Surgical History: Past Surgical History  Procedure Laterality Date  . Leg surgery    . Breast lumpectomy      breast cancer  . Lumbar laminectomy    . Achilles tendon surgery    . Tonsillectomy    . Vesicovaginal fistula closure w/ tah      DC hysterectomy  . Polypectomy    . Cholecystectomy N/A 08/13/2014    Procedure: LAPAROSCOPIC CHOLECYSTECTOMY;  Surgeon: Stark Klein, MD;  Location: [facility];  Service: General;  Laterality: N/A;    Family History: Family History  Problem Relation Age of Onset  . Diabetes Brother   . Alzheimer's disease Sister   . CVA Daughter     01/21/09  . Pancreatic cancer Father 68  . Pneumonia Mother 65    cause of death  . Heart attack Neg Hx   . Stroke Daughter   . Hypertension Daughter    Family Status  Relation Status Death Age  . Mother Deceased 36    healthy, died of pneumonia  . Brother Alive   . Father Deceased 59  . Sister Alive   . Sister Alive     healthy  . Daughter Alive     healthy  . Daughter Alive     healthy  . Daughter Alive   . Maternal Grandmother Deceased   . Maternal Grandfather Deceased   . Paternal Grandmother  Deceased   . Paternal Grandfather Deceased     Social History: Social History   Social History  . Marital Status: Married    Spouse Name: N/A  . Number of Children: 5  . Years of Education: HS   Occupational History  . Retired    Social History Main Topics  . Smoking status: Never Smoker   . Smokeless tobacco: Never Used  . Alcohol Use: No  . Drug Use: No  . Sexual Activity: Not on file   Other Topics Concern  . Not on file   Social History Narrative   Lives at home alone.   Right-handed.   Two cups caffeine daily (coffee).    Allergies:  Allergies  Allergen Reactions  . Cymbalta [Duloxetine Hcl] Other (See Comments)    Depression  . Lyrica [Pregabalin] Other (See Comments)    Blurry vision    Objective:    Vital Signs:   Temp:  [98.4 F (36.9 C)] 98.4 F (36.9 C) (09/23 1315) Pulse Rate:  [92] 92 (09/23 1315) Resp:  [17] 17 (09/23 1315) BP: (120)/(59) 120/59 mmHg (09/23 1315) SpO2:  [100 %] 100 % (09/23 1315) Weight:  [179 lb 14.3 oz (81.6 kg)] 179 lb 14.3 oz (81.6 kg) (09/23 1315) Last BM Date: 10/11/14 Filed Weights   10/11/14 1315  Weight: 179 lb 14.3 oz (81.6 kg)    Physical Exam:  General: elderly appearing, NAD, but SOB with conversation HEENT: Normal, without mass or lesion. Neck: Supple, no bruits. JVD 6-7 cm. Carotids 2+. No lymphadenopathy/thyromegaly appreciated. Heart: PMI nondisplaced. Tachy regular, no s4. +S3 no murmur appreciated Lungs:Diminished at bases L>R. Bibasilar crackles. Abdomen: Soft, non-tender, non-distended, No HSM, BS + x 4.  Extremities: No clubbing or cyanosis. Trace to 1+ ankle edema.  Legs cool to the touch Neuro: Alert and oriented X 3. Moves all extremities spontaneously.  Psych: Affect flat, but appropriate  Telemetry: Sinus Tach 100  Labs: Basic Metabolic Panel: No results for input(s): NA, K, CL, CO2, GLUCOSE, BUN, CREATININE, CALCIUM, MG, PHOS in the last 168 hours.  Liver Function Tests: No  results for input(s): AST, ALT, ALKPHOS, BILITOT, PROT, ALBUMIN in the last 168 hours. No results for input(s): LIPASE, AMYLASE in the last 168 hours. No results for input(s): AMMONIA in the last 168 hours.  CBC: No results for input(s): WBC, NEUTROABS, HGB, HCT, MCV, PLT in the last 168 hours.  Cardiac Enzymes: No results for input(s): CKTOTAL, CKMB, CKMBINDEX, TROPONINI in the last 168 hours.  BNP: BNP (last 3 results)  Recent Labs  08/16/14 0855 08/19/14 1523  BNP 1169.9* 1627.6*    ProBNP (last 3 results)  Recent Labs  09/25/14 1332  PROBNP 2219.0*     CBG: No results for input(s): GLUCAP in the last 168 hours.  Coagulation Studies: No results for input(s): LABPROT, INR in the last 72 hours.  Other results: EKG: Pending  Imaging: No results found.   Assessment:   1. Acute on Chronic Systolic Heart failure - NYHA Class IV Echo 10/02/14 EF 15%.  ? Due to Takatsubo syndrome 2. Recent PE/ DVT bilateral  3. CKD Stage 3 4. Hx of LBBB 5. Hypertension 6. Hypothryoidism   Plan/Discussion:    Symptoms concerning for low output. Admit for hemodynamic support.  Place PICC, check coox this pm and daily. Considered for inotropes.  May be candidate for home inotropes, will need to discuss with family.  Volume status stable. Likely OK to keep on 20 mg daily lasix for now, will reassess once PICC placed.  Follow Cr closely with diuresis.  Labs Pending.  Will hold Xarelto for PICC placement and reassess.  With co-morbidities conversation with palliative care team would be helpful to establish goals of care.   Length of Stay: 0   Shirley Friar 10/11/2014, 1:51 PM  Advanced Heart Failure Team Pager [phone] (M-F; 7a - 4p)  Please contact [facility] for night-coverage after hours (4p -7a ) and weekends on [email or web address]  Patient seen and examined with Oda Kilts, PA-C. We discussed all aspects of the encounter. I agree with the assessment and plan  as stated above.   Echo and recent chest CT reviewed (+ PE, no coronary calcium). She has severe LV dysfunction and moderate RV dysfunction now with low output HF. Suspect NICM. Discussed possibility of coronary angio to assess for CAD as underlying cause but given her age and clinical suspicion for NICM and she is not surgical candidate even if we found severe 3v CAD I think the best plan is to defer cath and proceed with aggressive management of her HF. Will place PICC line to assess co-ox and CVPs. Suspect she may need inotropes.   Orrie Schubert,MD 3:05 PM

## 2014-10-11 NOTE — Progress Notes (Signed)
Peripherally Inserted Central Catheter/Midline Placement  The IV Nurse has discussed with the patient and/or persons authorized to consent for the patient, the purpose of this procedure and the potential benefits and risks involved with this procedure.  The benefits include less needle sticks, lab draws from the catheter and patient may be discharged home with the catheter.  Risks include, but not limited to, infection, bleeding, blood clot (thrombus formation), and puncture of an artery; nerve damage and irregular heat beat.  Alternatives to this procedure were also discussed.  PICC/Midline Placement Documentation  PICC / Midline Double Lumen 10/11/14 PICC Right Brachial 38 cm 1 cm (Active)  Indication for Insertion or Continuance of Line Prolonged intravenous therapies 10/11/2014  5:07 PM  Exposed Catheter (cm) 1 cm 10/11/2014  5:07 PM  Dressing Change Due 10/18/14 10/11/2014  5:07 PM       Cothren, Nicolette Bang 10/11/2014, 5:07 PM

## 2014-10-11 NOTE — Addendum Note (Signed)
Addended by: Alvina Filbert B on: 10/11/2014 10:14 AM   Modules accepted: Orders, Medications

## 2014-10-11 NOTE — Progress Notes (Signed)
  Follow-up on results:  Co-ox: 61% (much better than I expected) CVP: 16  Will start IV lasix. Follow co-ox.   Bensimhon, Daniel,MD 9:13 PM

## 2014-10-11 NOTE — Progress Notes (Signed)
[facility]  Patient Status: Active (receiving services up to time of hospitalization)  [facility] is providing the following services: RN, PT and OT  If patient discharges after hours, please call [phone].   Alexandra [name] 10/11/2014, 3:07 PM

## 2014-10-12 DIAGNOSIS — R570 Cardiogenic shock: Secondary | ICD-10-CM

## 2014-10-12 LAB — BASIC METABOLIC PANEL
Anion gap: 10 (ref 5–15)
BUN: 24 mg/dL — AB (ref 6–20)
CHLORIDE: 95 mmol/L — AB (ref 101–111)
CO2: 31 mmol/L (ref 22–32)
CREATININE: 1.37 mg/dL — AB (ref 0.44–1.00)
Calcium: 9.3 mg/dL (ref 8.9–10.3)
GFR calc non Af Amer: 34 mL/min — ABNORMAL LOW (ref >60)
GFR, EST AFRICAN AMERICAN: 40 mL/min — AB (ref >60)
GLUCOSE: 108 mg/dL — AB (ref 65–99)
Potassium: 3.5 mmol/L (ref 3.5–5.1)
Sodium: 136 mmol/L (ref 135–145)

## 2014-10-12 LAB — CARBOXYHEMOGLOBIN
Carboxyhemoglobin: 1.3 % (ref 0.5–1.5)
Methemoglobin: 0.8 % (ref 0.0–1.5)
O2 Saturation: 53.9 %
Total hemoglobin: 7.7 g/dL — ABNORMAL LOW (ref 12.0–16.0)

## 2014-10-12 MED ORDER — MAGNESIUM SULFATE 2 GM/50ML IV SOLN
2.0000 g | Freq: Once | INTRAVENOUS | Status: AC
  Administered 2014-10-12: 2 g via INTRAVENOUS
  Filled 2014-10-12: qty 50

## 2014-10-12 MED ORDER — SPIRONOLACTONE 25 MG PO TABS
25.0000 mg | ORAL_TABLET | Freq: Every day | ORAL | Status: DC
  Administered 2014-10-13 – 2014-10-15 (×3): 25 mg via ORAL
  Filled 2014-10-12 (×4): qty 1

## 2014-10-12 MED ORDER — MILRINONE IN DEXTROSE 20 MG/100ML IV SOLN
0.1250 ug/kg/min | INTRAVENOUS | Status: DC
  Administered 2014-10-12 – 2014-10-15 (×5): 0.25 ug/kg/min via INTRAVENOUS
  Filled 2014-10-12 (×5): qty 100

## 2014-10-12 MED ORDER — FUROSEMIDE 80 MG PO TABS
80.0000 mg | ORAL_TABLET | Freq: Every day | ORAL | Status: DC
Start: 2014-10-13 — End: 2014-10-13
  Administered 2014-10-13: 80 mg via ORAL
  Filled 2014-10-12 (×2): qty 1

## 2014-10-12 NOTE — Progress Notes (Addendum)
Advanced Heart Failure Rounding Note   Subjective:    Co-ox now 65% on milrinone. Feels better. CVP 5. Occasional headache     Objective:   Weight Range:  Vital Signs:   Temp:  [97.4 F (36.3 C)-98.7 F (37.1 C)] 97.4 F (36.3 C) (09/24 1105) Pulse Rate:  [85-106] 92 (09/24 1105) Resp:  [15-25] 21 (09/24 1105) BP: (100-129)/(48-82) 107/63 mmHg (09/24 1105) SpO2:  [91 %-100 %] 99 % (09/24 1105) Weight:  [79 kg (174 lb 2.6 oz)-81.6 kg (179 lb 14.3 oz)] 79 kg (174 lb 2.6 oz) (09/24 0400) Last BM Date: 10/11/14  Weight change: Filed Weights   10/11/14 1315 10/12/14 0400  Weight: 81.6 kg (179 lb 14.3 oz) 79 kg (174 lb 2.6 oz)    Intake/Output:   Intake/Output Summary (Last 24 hours) at 10/12/14 1109 Last data filed at 10/12/14 1100  Gross per 24 hour  Intake   1160 ml  Output   3275 ml  Net  -2115 ml     Physical Exam: General: sitting in chair NAD HEENT: Normal, without mass or lesion. Neck: Supple, no bruits. JVP 5 cm. Carotids 2+. No lymphadenopathy/thyromegaly appreciated. Heart: PMI nondisplaced. Tachy regular, no s4. +S3 no murmur appreciated Lungs:clear Abdomen: Soft, non-tender, non-distended, No HSM, BS + x 4.  Extremities: No clubbing or cyanosis or edema RUE picc Neuro: Alert and oriented X 3. Moves all extremities spontaneously.  Psych: Affect flat, but appropriate  Telemetry: sinus 90s  Labs: Basic Metabolic Panel:  Recent Labs Lab 10/11/14 1459 10/12/14 0425  NA 138 136  K 4.1 3.5  CL 104 95*  CO2 26 31  GLUCOSE 111* 108*  BUN 22* 24*  CREATININE 1.20* 1.37*  CALCIUM 9.6 9.3  MG 1.8  --     Liver Function Tests:  Recent Labs Lab 10/11/14 1459  AST 40  ALT 29  ALKPHOS 39  BILITOT 0.9  PROT 7.8  ALBUMIN 3.7   No results for input(s): LIPASE, AMYLASE in the last 168 hours. No results for input(s): AMMONIA in the last 168 hours.  CBC:  Recent Labs Lab 10/11/14 1459  WBC 6.8  NEUTROABS 4.4  HGB 10.2*  HCT  33.0*  MCV 88.7  PLT 185    Cardiac Enzymes:  Recent Labs Lab 10/11/14 1459 10/11/14 1957  TROPONINI 0.03 0.03    BNP: BNP (last 3 results)  Recent Labs  08/16/14 0855 08/19/14 1523 10/11/14 1459  BNP 1169.9* 1627.6* 2298.4*    ProBNP (last 3 results)  Recent Labs  09/25/14 1332  PROBNP 2219.0*      Other results:  Imaging:  No results found.   Medications:     Scheduled Medications: . Chlorhexidine Gluconate Cloth  6 each Topical Q0600  . cholecalciferol  1,000 Units Oral Daily  . furosemide  80 mg Intravenous BID  . levothyroxine  25 mcg Oral QAC breakfast  . multivitamin with minerals  1 tablet Oral Daily  . mupirocin ointment  1 application Nasal BID  . pantoprazole  40 mg Oral BID  . potassium chloride  10 mEq Oral Daily  . rivaroxaban  20 mg Oral Q supper  . sodium chloride  10-40 mL Intracatheter Q12H  . sodium chloride  3 mL Intravenous Q12H  . spironolactone  12.5 mg Oral Daily     Infusions:     PRN Medications:  sodium chloride, acetaminophen, albuterol, ALPRAZolam, ondansetron (ZOFRAN) IV, oxyCODONE-acetaminophen, sodium chloride, sodium chloride, traMADol   Assessment:   1. Acute on  Chronic Systolic Heart failure - NYHA Class IV Echo 10/02/14 EF 15% -> Cardiogenic shock 2. Recent PE/ DVT bilateral  3. CKD Stage 3 4. Hx of LBBB 5. Hypertension 6. Hypothryoidism 7. Depression  Plan/Discussion:    Improved with milrinone and diuresis.Willplan d/c home tomorrow with home milrinone. Give tramadol for HAs. Start Celexa 10 for depression. Watch renal function.    Length of Stay: 1 Bensimhon, Daniel MD 10/12/2014, 11:09 AM Advanced Heart Failure Team Pager [phone] (M-F; [place])  Please contact [facility] for night-coverage after hours (4p -7a ) and weekends on [email or web address]

## 2014-10-13 ENCOUNTER — Encounter (HOSPITAL_COMMUNITY): Payer: Self-pay | Admitting: Internal Medicine

## 2014-10-13 DIAGNOSIS — F4321 Adjustment disorder with depressed mood: Secondary | ICD-10-CM

## 2014-10-13 DIAGNOSIS — R519 Headache, unspecified: Secondary | ICD-10-CM | POA: Insufficient documentation

## 2014-10-13 DIAGNOSIS — R51 Headache: Secondary | ICD-10-CM

## 2014-10-13 LAB — BASIC METABOLIC PANEL
ANION GAP: 10 (ref 5–15)
BUN: 25 mg/dL — ABNORMAL HIGH (ref 6–20)
CALCIUM: 9.2 mg/dL (ref 8.9–10.3)
CO2: 32 mmol/L (ref 22–32)
Chloride: 95 mmol/L — ABNORMAL LOW (ref 101–111)
Creatinine, Ser: 1.47 mg/dL — ABNORMAL HIGH (ref 0.44–1.00)
GFR, EST AFRICAN AMERICAN: 36 mL/min — AB (ref >60)
GFR, EST NON AFRICAN AMERICAN: 31 mL/min — AB (ref >60)
Glucose, Bld: 127 mg/dL — ABNORMAL HIGH (ref 65–99)
POTASSIUM: 3.7 mmol/L (ref 3.5–5.1)
SODIUM: 137 mmol/L (ref 135–145)

## 2014-10-13 LAB — CARBOXYHEMOGLOBIN
Carboxyhemoglobin: 1.7 % — ABNORMAL HIGH (ref 0.5–1.5)
Methemoglobin: 0.8 % (ref 0.0–1.5)
O2 Saturation: 64.1 %
Total hemoglobin: 11.1 g/dL — ABNORMAL LOW (ref 12.0–16.0)

## 2014-10-13 MED ORDER — TRAMADOL HCL 50 MG PO TABS
50.0000 mg | ORAL_TABLET | Freq: Four times a day (QID) | ORAL | Status: DC | PRN
  Administered 2014-10-14: 50 mg via ORAL
  Filled 2014-10-13: qty 1

## 2014-10-13 MED ORDER — FUROSEMIDE 40 MG PO TABS
40.0000 mg | ORAL_TABLET | Freq: Every day | ORAL | Status: DC
  Administered 2014-10-14: 40 mg via ORAL
  Filled 2014-10-13: qty 1

## 2014-10-13 MED ORDER — CITALOPRAM HYDROBROMIDE 10 MG PO TABS
10.0000 mg | ORAL_TABLET | Freq: Every day | ORAL | Status: DC
  Administered 2014-10-14 – 2014-10-15 (×2): 10 mg via ORAL
  Filled 2014-10-13 (×3): qty 1

## 2014-10-14 ENCOUNTER — Inpatient Hospital Stay (HOSPITAL_COMMUNITY): Payer: Commercial Managed Care - HMO

## 2014-10-14 LAB — CARBOXYHEMOGLOBIN
CARBOXYHEMOGLOBIN: 1.3 % (ref 0.5–1.5)
METHEMOGLOBIN: 0.8 % (ref 0.0–1.5)
O2 SAT: 59.2 %
Total hemoglobin: 11.6 g/dL — ABNORMAL LOW (ref 12.0–16.0)

## 2014-10-14 LAB — BASIC METABOLIC PANEL
ANION GAP: 9 (ref 5–15)
BUN: 20 mg/dL (ref 6–20)
CO2: 35 mmol/L — ABNORMAL HIGH (ref 22–32)
Calcium: 9.3 mg/dL (ref 8.9–10.3)
Chloride: 94 mmol/L — ABNORMAL LOW (ref 101–111)
Creatinine, Ser: 1.29 mg/dL — ABNORMAL HIGH (ref 0.44–1.00)
GFR calc Af Amer: 43 mL/min — ABNORMAL LOW (ref >60)
GFR, EST NON AFRICAN AMERICAN: 37 mL/min — AB (ref >60)
GLUCOSE: 115 mg/dL — AB (ref 65–99)
POTASSIUM: 3.8 mmol/L (ref 3.5–5.1)
Sodium: 138 mmol/L (ref 135–145)

## 2014-10-14 MED ORDER — AMIODARONE LOAD VIA INFUSION
150.0000 mg | Freq: Once | INTRAVENOUS | Status: AC
  Administered 2014-10-14: 150 mg via INTRAVENOUS
  Filled 2014-10-14: qty 83.34

## 2014-10-14 MED ORDER — FUROSEMIDE 10 MG/ML IJ SOLN
INTRAMUSCULAR | Status: AC
  Filled 2014-10-14: qty 4

## 2014-10-14 MED ORDER — AMIODARONE HCL IN DEXTROSE 360-4.14 MG/200ML-% IV SOLN
60.0000 mg/h | INTRAVENOUS | Status: DC
  Administered 2014-10-14: 60 mg/h via INTRAVENOUS
  Filled 2014-10-14 (×2): qty 200

## 2014-10-14 MED ORDER — AMIODARONE HCL IN DEXTROSE 360-4.14 MG/200ML-% IV SOLN: 30.0000 mg/h | INTRAVENOUS | Status: DC

## 2014-10-14 MED ORDER — DIGOXIN 125 MCG PO TABS: 0.0625 mg | ORAL_TABLET | Freq: Every day | ORAL | Status: DC

## 2014-10-14 MED ORDER — FUROSEMIDE 10 MG/ML IJ SOLN
40.0000 mg | Freq: Once | INTRAMUSCULAR | Status: AC
  Administered 2014-10-14: 40 mg via INTRAVENOUS

## 2014-10-14 NOTE — Progress Notes (Signed)
ANTICOAGULATION CONSULT NOTE - Follow Up Consult  Pharmacy Consult for rivaroxaban Indication: Hx of PE/DVT  Allergies  Allergen Reactions  . Cymbalta [Duloxetine Hcl] Other (See Comments)    Depression  . Lyrica [Pregabalin] Other (See Comments)    Blurry vision    Patient Measurements: Height: 5' 4.5" (163.8 cm) Weight: 170 lb 1.6 oz (77.157 kg) IBW/kg (Calculated) : 55.85   Vital Signs: Temp: 97.5 F (36.4 C) (09/26 0741) Temp Source: Oral (09/26 0741) BP: 123/64 mmHg (09/26 0741) Pulse Rate: 115 (09/26 0741)  Labs:  Recent Labs  10/11/14 1459 10/11/14 1957 10/12/14 0425 10/13/14 0430 10/14/14 0308  HGB 10.2*  --   --   --   --   HCT 33.0*  --   --   --   --   PLT 185  --   --   --   --   LABPROT 23.2*  --   --   --   --   INR 2.07*  --   --   --   --   CREATININE 1.20*  --  1.37* 1.47* 1.29*  TROPONINI 0.03 0.03  --   --   --     Estimated Creatinine Clearance: 32.4 mL/min (by C-G formula based on Cr of 1.29).   Assessment: 87yoF admitted 10/11/2014 as a direct admit with hypotension, tachycardia, and worsening fatigue.   Hx of DVT/PE (08/2014) on Xarelto 20mg  daily PTA. SCr on borderline for continued use of Xarelto, will continue at this time. H/H stable, Plt wnl. No s/sx of bleeding noted.   Goal of Therapy:  Monitor platelets by anticoagulation protocol: Yes   Plan:  - Continue home dose Xarelto 20 mg daily - Monitor CBC, renal function, and s/sx of bleeding  Dimitri Ped, PharmD. Clinical Pharmacist Resident Pager: [phone] 10/14/2014,9:39 AM

## 2014-10-14 NOTE — Progress Notes (Signed)
Advanced Heart Failure Rounding Note   Subjective:    Remains on milrinone. Co-ox 59%. Feels better. CVP 7-8.     Objective:   Weight Range:  Vital Signs:   Temp:  [98 F (36.7 C)-98.2 F (36.8 C)] 98.1 F (36.7 C) (09/26 0012) Pulse Rate:  [92-122] 109 (09/26 0012) Resp:  [13-18] 14 (09/26 0012) BP: (99-117)/(46-67) 117/67 mmHg (09/26 0400) SpO2:  [94 %-100 %] 99 % (09/26 0012) Weight:  [77.157 kg (170 lb 1.6 oz)] 77.157 kg (170 lb 1.6 oz) (09/26 0303) Last BM Date: 10/12/14  Weight change: Filed Weights   10/13/14 0430 10/13/14 2339 10/14/14 0303  Weight: 77.474 kg (170 lb 12.8 oz) 77.157 kg (170 lb 1.6 oz) 77.157 kg (170 lb 1.6 oz)    Intake/Output:   Intake/Output Summary (Last 24 hours) at 10/14/14 0646 Last data filed at 10/14/14 0400  Gross per 24 hour  Intake  912.1 ml  Output   1775 ml  Net -862.9 ml     Physical Exam: General: sitting in bed  NAD HEENT: Normal, without mass or lesion. Neck: Supple, no bruits. JVP 7 cm. Carotids 2+. No lymphadenopathy/thyromegaly appreciated. Heart: PMI nondisplaced. Tachy regular, no s4. +S3 no murmur appreciated Lungs:clear Abdomen: Soft, non-tender, non-distended, No HSM, BS + x 4.  Extremities: No clubbing or cyanosis or edema RUE picc Neuro: Alert and oriented X 3. Moves all extremities spontaneously.  Psych: Affect flat, but appropriate  Telemetry: sinus tach 100-120  Labs: Basic Metabolic Panel:  Recent Labs Lab 10/11/14 1459 10/12/14 0425 10/13/14 0430 10/14/14 0308  NA 138 136 137 138  K 4.1 3.5 3.7 3.8  CL 104 95* 95* 94*  CO2 26 31 32 35*  GLUCOSE 111* 108* 127* 115*  BUN 22* 24* 25* 20  CREATININE 1.20* 1.37* 1.47* 1.29*  CALCIUM 9.6 9.3 9.2 9.3  MG 1.8  --   --   --     Liver Function Tests:  Recent Labs Lab 10/11/14 1459  AST 40  ALT 29  ALKPHOS 39  BILITOT 0.9  PROT 7.8  ALBUMIN 3.7   No results for input(s): LIPASE, AMYLASE in the last 168 hours. No results for  input(s): AMMONIA in the last 168 hours.  CBC:  Recent Labs Lab 10/11/14 1459  WBC 6.8  NEUTROABS 4.4  HGB 10.2*  HCT 33.0*  MCV 88.7  PLT 185    Cardiac Enzymes:  Recent Labs Lab 10/11/14 1459 10/11/14 1957  TROPONINI 0.03 0.03    BNP: BNP (last 3 results)  Recent Labs  08/16/14 0855 08/19/14 1523 10/11/14 1459  BNP 1169.9* 1627.6* 2298.4*    ProBNP (last 3 results)  Recent Labs  09/25/14 1332  PROBNP 2219.0*      Other results:  Imaging: No results found.   Medications:     Scheduled Medications: . Chlorhexidine Gluconate Cloth  6 each Topical Q0600  . cholecalciferol  1,000 Units Oral Daily  . citalopram  10 mg Oral Daily  . furosemide  40 mg Oral Daily  . levothyroxine  25 mcg Oral QAC breakfast  . multivitamin with minerals  1 tablet Oral Daily  . mupirocin ointment  1 application Nasal BID  . pantoprazole  40 mg Oral BID  . potassium chloride  10 mEq Oral Daily  . rivaroxaban  20 mg Oral Q supper  . sodium chloride  10-40 mL Intracatheter Q12H  . sodium chloride  3 mL Intravenous Q12H  . spironolactone  25 mg Oral Daily  Infusions: . milrinone 0.25 mcg/kg/min (10/14/14 0000)    PRN Medications: sodium chloride, acetaminophen, albuterol, ALPRAZolam, ondansetron (ZOFRAN) IV, oxyCODONE-acetaminophen, sodium chloride, sodium chloride, traMADol   Assessment:   1. Acute on Chronic Systolic Heart failure - NYHA Class IV Echo 10/02/14 EF 15% -> Cardiogenic shock 2. Recent PE/ DVT bilateral  3. CKD Stage 3 4. Hx of LBBB 5. Hypertension 6. Hypothryoidism 7. Depression  Plan/Discussion:    Improved with milrinone and diuresis. HR remains fast. Start digoxin 0.0625 daily   Possible d/c home today with home milrinone.  Celexa 10 started for depression.    Length of Stay: 3 Glori Bickers MD 10/14/2014, 6:46 AM Advanced Heart Failure Team Pager [phone] (M-F; [place])  Please contact [facility] for night-coverage  after hours (4p -7a ) and weekends on [email or web address]

## 2014-10-14 NOTE — Discharge Instructions (Signed)

## 2014-10-14 NOTE — Progress Notes (Signed)
Patient had several episodes of SVT thru the night, 0145 HR 120s sustaining, 0240 HR up to 170 not sustaining, 0342 HR 150's , and again 0422 HR 130/150 sustaining. Patient asymptomatic sitting in chair and sleeping. VSS remained stable. EKG done 0350 trying to catch episode of SVT. Patient annoyed at ringing of the monitor and she worried that something was wrong with her heart. Reassured pt and she stated she did not feel her heart racing.  Gave patient a dose of Xanax and she went to sleep.  Will continue to monitor closely.

## 2014-10-14 NOTE — Care Management Important Message (Signed)
Important Message  Patient Details  Name: Alexandra [name] MRN: [id number] Date of Birth: Aug 10, 1927   Medicare Important Message Given:  Yes-second notification given    Lacretia Leigh, RN 10/14/2014, 9:17 AM

## 2014-10-14 NOTE — Progress Notes (Signed)
[facility]  Patient Status: Active HH pt with [facility] prior to this readmission  [facility] is providing the following services: HHRN, PT and OT. [facility] team will support home Milrinone for pt upon DC. [facility] hospital infusion coordinator will provide in hospital teaching regarding home Milrinone program and [facility] "[facility]"  HF program to support transition back home.   If patient discharges after hours, please call [phone].   Alexandra [name] 10/14/2014, 2:21 PM

## 2014-10-14 NOTE — Progress Notes (Signed)
   Called by nursing staff for acute dyspnea.   Earlier this morning she devoloped episodes of SVT. Given amio bolus and started on amio drip about 1 hour ago.   Now with increased dyspnea and nausea. Sitting upright on the side of the bed. SBP 112/88 HR 109 and O2 sats 99%. CVP 9 . Crackles in the bases.  Give 40 mg IV lasix now.    Stop Amio and get stat CXR .    CLEGG,AMY NP-C  10:43 AM   Patient seen and examined with Darrick Grinder, NP. We discussed all aspects of the encounter. I agree with the assessment and plan as stated above.   Bensimhon, Quillian Quince

## 2014-10-14 NOTE — Progress Notes (Signed)
Called into room by patient's daughter, patient having acute dyspnea and nausea. Darrick Grinder, NP made aware and at bedside. Dr. Haroldine Laws also at bedside. New orders given to d/c amiodarone gtt, give IV lasix and obtain stat CXR.  Update 1330: Patient now resting comfortably in bed with family in room. Patient had one instance of NSVT around noon that lasted 1-2 minutes. Patient is now in NSR and VSS. Darrick Grinder, NP aware and at bedside to update family. Will continue to monitor.

## 2014-10-15 ENCOUNTER — Ambulatory Visit: Payer: Commercial Managed Care - HMO | Admitting: Cardiology

## 2014-10-15 DIAGNOSIS — I5043 Acute on chronic combined systolic (congestive) and diastolic (congestive) heart failure: Secondary | ICD-10-CM | POA: Diagnosis not present

## 2014-10-15 DIAGNOSIS — J189 Pneumonia, unspecified organism: Secondary | ICD-10-CM | POA: Diagnosis not present

## 2014-10-15 DIAGNOSIS — I429 Cardiomyopathy, unspecified: Secondary | ICD-10-CM | POA: Diagnosis not present

## 2014-10-15 DIAGNOSIS — I2699 Other pulmonary embolism without acute cor pulmonale: Secondary | ICD-10-CM | POA: Diagnosis not present

## 2014-10-15 DIAGNOSIS — I471 Supraventricular tachycardia: Secondary | ICD-10-CM

## 2014-10-15 LAB — CARBOXYHEMOGLOBIN
CARBOXYHEMOGLOBIN: 1.8 % — AB (ref 0.5–1.5)
Carboxyhemoglobin: 1.5 % (ref 0.5–1.5)
Methemoglobin: 0.6 % (ref 0.0–1.5)
Methemoglobin: 0.4 % (ref 0.0–1.5)
O2 SAT: 84.3 %
O2 SAT: 59.1 %
TOTAL HEMOGLOBIN: 9.8 g/dL — AB (ref 12.0–16.0)
Total hemoglobin: 8 g/dL — ABNORMAL LOW (ref 12.0–16.0)

## 2014-10-15 LAB — BASIC METABOLIC PANEL
Anion gap: 8 (ref 5–15)
BUN: 19 mg/dL (ref 6–20)
CO2: 34 mmol/L — AB (ref 22–32)
CREATININE: 1.32 mg/dL — AB (ref 0.44–1.00)
Calcium: 8.6 mg/dL — ABNORMAL LOW (ref 8.9–10.3)
Chloride: 91 mmol/L — ABNORMAL LOW (ref 101–111)
GFR calc Af Amer: 41 mL/min — ABNORMAL LOW (ref >60)
GFR calc non Af Amer: 36 mL/min — ABNORMAL LOW (ref >60)
Glucose, Bld: 113 mg/dL — ABNORMAL HIGH (ref 65–99)
Potassium: 3.7 mmol/L (ref 3.5–5.1)
SODIUM: 133 mmol/L — AB (ref 135–145)

## 2014-10-15 MED ORDER — MILRINONE IN DEXTROSE 20 MG/100ML IV SOLN: 0.1250 ug/kg/min | INTRAVENOUS | Status: DC

## 2014-10-15 MED ORDER — POTASSIUM CHLORIDE ER 10 MEQ PO TBCR: 10.0000 meq | EXTENDED_RELEASE_TABLET | Freq: Every day | ORAL | Status: DC

## 2014-10-15 MED ORDER — SPIRONOLACTONE 25 MG PO TABS
25.0000 mg | ORAL_TABLET | Freq: Every day | ORAL | Status: DC
Start: 2014-10-15 — End: 2015-05-10

## 2014-10-15 MED ORDER — DIGOXIN 62.5 MCG PO TABS: 0.0625 mg | ORAL_TABLET | Freq: Every day | ORAL | Status: DC

## 2014-10-15 MED ORDER — FUROSEMIDE 40 MG PO TABS
40.0000 mg | ORAL_TABLET | Freq: Every day | ORAL | Status: DC
  Administered 2014-10-15: 40 mg via ORAL
  Filled 2014-10-15: qty 1

## 2014-10-15 MED ORDER — FUROSEMIDE 40 MG PO TABS: 40.0000 mg | ORAL_TABLET | Freq: Every day | ORAL | Status: DC

## 2014-10-15 MED ORDER — POTASSIUM CHLORIDE CRYS ER 20 MEQ PO TBCR
20.0000 meq | EXTENDED_RELEASE_TABLET | Freq: Once | ORAL | Status: AC
  Administered 2014-10-15: 20 meq via ORAL
  Filled 2014-10-15: qty 1

## 2014-10-15 MED ORDER — DIGOXIN 125 MCG PO TABS
0.0625 mg | ORAL_TABLET | Freq: Every day | ORAL | Status: DC
  Administered 2014-10-15: 0.0625 mg via ORAL
  Filled 2014-10-15: qty 1

## 2014-10-15 MED ORDER — CITALOPRAM HYDROBROMIDE 10 MG PO TABS: 10.0000 mg | ORAL_TABLET | Freq: Every day | ORAL | Status: DC

## 2014-10-15 NOTE — Discharge Summary (Signed)
Advanced Heart Failure Team  Discharge Summary   Patient ID: Alexandra [name] MRN: [id number], DOB/AGE: October 27, 1927 81 y.o. Admit date: 10/11/2014 D/C date:     10/15/2014   Primary Discharge Diagnoses:  1. Acute on Chronic Systolic Heart failure - NYHA Class IV Echo 10/02/14 EF 15% -> Cardiogenic shock - Now on milrinone 0.125 mcg, to go home with same. 2. Recent PE/ DVT bilateral  3. CKD Stage 3  4. Hx of LBBB 5. Hypertension 6. Hypothryoidism 7. Depression 8. PSVT 9. Intolerance of amiodarone due to possible acute lung toxicity  Hospital Course:  Alexandra [name] is a 81 y.o. 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   PICC line was placed  with initial co-ox of 61%.CVP was 15. IV lasix was started. He co-ox dropped to 53% and milrinone was started.  Her symptoms improved on milrinone and lasix 80 mg IV BID. Co-ox improved marginally and decision was made to pursue trial of home milrinone. While on milrinone 0.25 she developed several episodes of SVT. IV amiodarone was started. Shortly after starting amio she developed acute SOB. CXR was ok.  She improved quickly with one dose of 40 mg IV lasix and d/c of the amio drip with no further issues. Amio added to allergies for possible acute lung toxicity. Milrinone decreased to 0.125 due to PSVT.   Overall the pt diuresed 2.6L and 9 lbs this admission with lasix 80 mg IV BID. Transitioned to 40 mg po Lasix daily for new home dose. Pt is not a good candidate for advanced therapies (LVAD or transplant) with advanced age and comorbidities.   On the day of discharge she was feeling better.   She will be discharged in stable condition to home on milrinone with [facility] services  provided by [facility]. She will have close follow up in the HF clinic as below.   Discharge Weight Range: 170 lb  Discharge Vitals: Blood pressure 103/48, pulse 104, temperature 98 F (36.7 C), temperature source Oral, resp. rate 15, height 5' 4.5" (1.638 m), weight 170 lb 10.2 oz (77.4 kg), SpO2 95 %.  Labs: Lab Results  Component Value Date   WBC 6.8 10/11/2014   HGB 10.2* 10/11/2014   HCT 33.0* 10/11/2014   MCV 88.7 10/11/2014   PLT 185 10/11/2014    Recent Labs Lab 10/11/14 1459  10/15/14 0505  NA 138  < > 133*  K 4.1  < > 3.7  CL 104  < > 91*  CO2 26  < > 34*  BUN 22*  < > 19  CREATININE 1.20*  < > 1.32*  CALCIUM 9.6  < > 8.6*  PROT 7.8  --   --   BILITOT 0.9  --   --   ALKPHOS 39  --   --   ALT 29  --   --   AST 40  --   --   GLUCOSE 111*  < > 113*  < > = values in this interval not displayed. No results found for: CHOL, HDL, LDLCALC, TRIG BNP (last 3 results)  Recent Labs  08/16/14 0855 08/19/14 1523 10/11/14 1459  BNP 1169.9* 1627.6* 2298.4*    ProBNP (last 3 results)  Recent Labs  09/25/14 1332  PROBNP 2219.0*     Diagnostic Studies/Procedures   Dg Chest Port 1 View  10/14/2014   CLINICAL DATA:  Shortness of breath  EXAM: PORTABLE CHEST 1 VIEW  COMPARISON:  10/09/2014  FINDINGS: There is chronic elevation of the left diaphragm. Bibasilar patchy airspace disease unchanged compared with multiple prior exams which may reflect atelectasis versus scarring versus less likely pneumonia. There is no pleural effusion or pneumothorax. There is stable cardiomegaly.  The osseous structures are unremarkable.  IMPRESSION: Bibasilar patchy airspace disease unchanged compared with multiple prior exams which may reflect atelectasis versus scarring versus less likely pneumonia.   Electronically Signed   By: Kathreen Devoid   On: 10/14/2014 11:50    Discharge Medications     Medication List    STOP taking these medications        carvedilol 6.25 MG tablet  Commonly  known as:  COREG      TAKE these medications        acetaminophen 500 MG tablet  Commonly known as:  TYLENOL  Take 500 mg by mouth every 6 (six) hours as needed for mild pain.     alendronate 70 MG tablet  Commonly known as:  FOSAMAX  Take 70 mg by mouth every Saturday. Take with a full glass of water on an empty stomach.     ALPRAZolam 0.25 MG tablet  Commonly known as:  XANAX  Take 0.25 mg by mouth 3 (three) times daily as needed for anxiety.     b complex vitamins tablet  Take 1 tablet by mouth every evening.     calcium carbonate 500 MG chewable tablet  Commonly known as:  TUMS - dosed in mg elemental calcium  Chew 2 tablets by mouth daily as needed for indigestion or heartburn.     CALTRATE 600 PLUS-VIT D PO  Take 1 tablet by mouth 2 (two) times daily.     cholecalciferol 1000 UNITS tablet  Commonly known as:  VITAMIN D  Take 1,000 Units by mouth daily.     citalopram 10 MG tablet  Commonly known as:  CELEXA  Take 1 tablet (10 mg total) by mouth daily.     Digoxin 62.5 MCG Tabs  Take 0.0625 mg by mouth daily.     furosemide 40 MG tablet  Commonly known as:  LASIX  Take 1 tablet (40 mg total) by mouth daily.     lactose free nutrition Liqd  Take 237 mLs by mouth 3 (three) times daily between meals.     levothyroxine 25 MCG tablet  Commonly known as:  SYNTHROID, LEVOTHROID  Take one tablet by mouth once daily in the morning before breakfast for thyroid     milrinone 20 MG/100ML Soln infusion  Commonly known as:  PRIMACOR  Inject 9.875 mcg/min into the vein continuous.     multivitamin with minerals Tabs tablet  Take 1 tablet by mouth daily.     nitroGLYCERIN 0.4 MG SL tablet  Commonly known as:  NITROSTAT  Place 0.4 mg under the tongue every 5 (five) minutes as needed for chest pain.     OXYGEN  Inhale 2 L into the lungs daily.     pantoprazole 40 MG tablet  Commonly known as:  PROTONIX  Take 40 mg by mouth 2 (two) times daily.     polyethylene  glycol packet  Commonly known as:  MIRALAX / GLYCOLAX  Take 17 g by mouth daily as needed for mild constipation.  potassium chloride 10 MEQ tablet  Commonly known as:  K-DUR  Take 1 tablet (10 mEq total) by mouth daily.     promethazine 25 MG tablet  Commonly known as:  PHENERGAN  Take one tablet by mouth every 8 hours as needed for nausea     rivaroxaban 20 MG Tabs tablet  Commonly known as:  XARELTO  Take 1 tablet (20 mg total) by mouth daily with supper.     sodium chloride 0.65 % Soln nasal spray  Commonly known as:  OCEAN  Place 1 spray into both nostrils as needed for congestion.     spironolactone 25 MG tablet  Commonly known as:  ALDACTONE  Take 1 tablet (25 mg total) by mouth daily.     traMADol 50 MG tablet  Commonly known as:  ULTRAM  Take 1 tablet (50 mg total) by mouth every 6 (six) hours as needed for moderate pain.     VENTOLIN HFA 108 (90 BASE) MCG/ACT inhaler  Generic drug:  albuterol  INhaLe 2 PufFS by mouth every 4 hours as needed for shortness of breath or wheezing        Disposition   The patient will be discharged in stable condition to home on milrinone with [facility] services provided by [facility]  Discharge Instructions    Contraindication to ACEI at discharge    Complete by:  As directed      Diet - low sodium heart healthy    Complete by:  As directed      Heart Failure patients record your daily weight using the same scale at the same time of day    Complete by:  As directed      Increase activity slowly    Complete by:  As directed           Follow-up Information    Follow up with Glori Bickers, MD On 10/23/2014.   Specialty:  Cardiology   Why:  at 200 pm for post hospital follow up.  Please bring all of your medications with you to your visit.  Please call the office for the patient parking code.   Contact information:   [address] [place] [address] [phone]         Duration of Discharge Encounter:  Greater than 35 minutes   Signed, Annamaria Helling 10/15/2014, 1:22 PM   Patient seen and examined with Oda Kilts, PA-C. We discussed all aspects of the encounter. I agree with the assessment and plan as stated above. I have edited the discharge summary with my changes.  Bensimhon, Daniel,MD 9:59 PM

## 2014-10-15 NOTE — Progress Notes (Signed)
SATURATION QUALIFICATIONS: (This note is used to comply with regulatory documentation for home oxygen)  Patient Saturations on Room Air at Rest = 93%  Patient Saturations on Room Air while Ambulating = 83%  Patient Saturations on 3-4 Liters of oxygen while Ambulating = >90%  Please briefly explain why patient needs home oxygen:Pt desats to low to mid 80's when ambulating without O2.  Will need home O2 as pt had previously.  Thanks.  [place] [phone] (pager)

## 2014-10-15 NOTE — Care Management Note (Signed)
Case Management Note  Patient Details  Name: JONISHA LEPPO MRN: [id number] Date of Birth: 1927/04/22  Subjective/Objective:      Adm w heart failure              Action/Plan: lives at home w fam, act w [facility]c   Expected Discharge Date:   10/15/14               Expected Discharge Plan:  [place]  In-House Referral:     Discharge planning Services  CM Consult  Post Acute Care Choice:  Resumption of Svcs/PTA Provider Choice offered to:     DME Arranged:  IV pump/equipment DME Agency:  [place]:  RN, PT, OT, Nurse's Aide [place] Agency:  [place]  Status of Service:  Completed, signed off  Medicare Important Message Given:  Yes-second notification given Date Medicare IM Given:    Medicare IM give by:    Date Additional Medicare IM Given:    Additional Medicare Important Message give by:     If discussed at [place] of Stay Meetings, dates discussed:    Additional Comments:adv homecare aware of dc home today. Pam w ahc to hook up home iv milrinone prior to disch. One of pt's da to bring home o2 for transport home.  Lacretia Marely Apgar, RN 10/15/2014, 2:27 PM

## 2014-10-15 NOTE — Progress Notes (Signed)
Called patients daughter, Precious Bard and son, Broadus John to let them know the patients copy of AVS was found in her room after discharge. Copy left at front desk of our unit 2H.   Loni Muse, RN

## 2014-10-15 NOTE — Telephone Encounter (Signed)
Has this been taken care of?

## 2014-10-15 NOTE — Progress Notes (Signed)
Reviewed AVS with patient and daughter in great detail. Patient sent home with cell phone, copy of signed AVS, and  milrinone equipment. Patient denies any further questions or complaints at this time.   Loni Muse, RN

## 2014-10-15 NOTE — Evaluation (Signed)
Physical Therapy Evaluation and D/C  Patient Details Name: Alexandra [name] MRN: [id number] DOB: 25-May-1927 Today's Date: 10/15/2014   History of Present Illness  Alexandra [name] is a 81 y.o. female with chronic systolic CHF Echo 2/75/17 EF 15% with diffuse hypokinesis initially thought to be takatsubos CMP, HX of PE/DVT 08/2014 on Xarelto, CKD stage 3, HTN, hx of LBBB, and hypothyroidism.  Admit with CHF.    Clinical Impression  Pt admitted with above diagnosis. Pt currently with functional limitations due to the deficits listed below (see PT Problem List). Pt able to ambulate with good safety withRW.  Would benefit from PT and OT in home to inprove endurance for activity and to work on strength as pts eventual goal is to no longer use the RW.  Will not follow pt as she is to d/c today per MD.  All arrangements to continue therapy at home made. Will sign off.    Follow Up Recommendations Home health PT;Supervision - Intermittent (HHAide, HHOT)    Equipment Recommendations  None recommended by PT (continued home O2)    Recommendations for Other Services       Precautions / Restrictions Precautions Precautions: Fall Restrictions Weight Bearing Restrictions: No      Mobility  Bed Mobility Overal bed mobility: Independent                Transfers Overall transfer level: Needs assistance Equipment used: Rolling walker (2 wheeled) Transfers: Sit to/from Stand Sit to Stand: Min guard         General transfer comment: Pt able to achieve sit to stand with guard assist. Pt was on 3N1 on arrival.  PT cleaned pt when she was done on toilet.  Pt was able to pull her undergarments up and maintained balance.   Ambulation/Gait Ambulation/Gait assistance: Min guard Ambulation Distance (Feet): 120 Feet Assistive device: Rolling walker (2 wheeled) Gait Pattern/deviations: Step-through pattern;Decreased stride length   Gait velocity interpretation: Below normal speed for  age/gender General Gait Details: Pt ambulated with good safety with RW.  No LOB with device.    Stairs            Wheelchair Mobility    Modified Rankin (Stroke Patients Only)       Balance Overall balance assessment: Needs assistance         Standing balance support: Bilateral upper extremity supported;During functional activity Standing balance-Leahy Scale: Fair Standing balance comment: can stand statically without UE support.               High level balance activites: Direction changes;Turns High Level Balance Comments: min guard assist.              Pertinent Vitals/Pain Pain Assessment: No/denies pain    SATURATION QUALIFICATIONS: (This note is used to comply with regulatory documentation for home oxygen)  Patient Saturations on Room Air at Rest = 93%  Patient Saturations on Room Air while Ambulating = 83%  Patient Saturations on 3-4 Liters of oxygen while Ambulating = >90%  Please briefly explain why patient needs home oxygen:Pt desats to low to mid 80's when ambulating without O2.  Will need home O2 as pt had previously.  Home Living Family/patient expects to be discharged to:: Private residence Living Arrangements: Alone Available Help at Discharge: Family;Available PRN/intermittently Type of Home: House Home Access: Stairs to enter Entrance Stairs-Rails: Can reach both Entrance Stairs-Number of Steps: 2 Home Layout: Two level;Able to live on main level with bedroom/bathroom Home Equipment: Gilford Rile -  2 wheels;Cane - quad;Bedside commode;Shower seat;Wheelchair - manual (recently O2 at home initiated) Additional Comments: daughter reports  family unable to provide 24/7 but they have arranged as much care as they can at home    Prior Function Level of Independence: Needs assistance   Gait / Transfers Assistance Needed: ambulates in home with RW.  ADL's / Homemaking Assistance Needed: able to perform on her own        Hand Dominance         Extremity/Trunk Assessment   Upper Extremity Assessment: Defer to OT evaluation           Lower Extremity Assessment: Generalized weakness      Cervical / Trunk Assessment: Normal  Communication   Communication: No difficulties  Cognition Arousal/Alertness: Awake/alert Behavior During Therapy: WFL for tasks assessed/performed Overall Cognitive Status: Within Functional Limits for tasks assessed                      General Comments      Exercises General Exercises - Lower Extremity Long Arc Quad: AROM;Both;5 reps;Seated Hip Flexion/Marching: AROM;Both;5 reps;Seated      Assessment/Plan    PT Assessment All further PT needs can be met in the next venue of care  PT Diagnosis Generalized weakness   PT Problem List Decreased balance;Decreased mobility;Decreased knowledge of use of DME;Decreased safety awareness;Decreased knowledge of precautions;Decreased activity tolerance  PT Treatment Interventions DME instruction;Gait training;Functional mobility training;Therapeutic activities;Therapeutic exercise;Balance training;Patient/family education   PT Goals (Current goals can be found in the Care Plan section) Acute Rehab PT Goals Patient Stated Goal: to go home today PT Goal Formulation: All assessment and education complete in hospital,Pt to  DC home and continue therapy In the home   Frequency Min 3X/week   Barriers to discharge Decreased caregiver support      Co-evaluation               End of Session Equipment Utilized During Treatment: Gait belt;Oxygen Activity Tolerance: Patient limited by fatigue Patient left: in chair;with call bell/phone within reach;with chair alarm set;with family/visitor present Nurse Communication: Mobility status         Time: 1133-1208 PT Time Calculation (min) (ACUTE ONLY): 35 min   Charges:   PT Evaluation $Initial PT Evaluation Tier I: 1 Procedure PT Treatments $Gait Training: 8-22 mins   PT G  CodesDenice Paradise 09-Nov-2014, 1:44 PM  Stanfield White,PT Acute Rehabilitation [phone] (pager)

## 2014-10-15 NOTE — Consult Note (Signed)
   [facility] CM Inpatient Consult   10/15/2014  MYRON SCHUE 12/13/1927 [id number] Referral received.  Patient evaluated for [facility] services.  Came by to see the patient and she was currently receiving nursing care.  Spoke with inpatient RNCM. Will continue to follow up for community needs for case/disease management for HF monitoring.  For questions, please contact: Natividad Brood, RN BSN [facility] Liaison  [phone] business mobile phone

## 2014-10-15 NOTE — Progress Notes (Signed)
Advanced Heart Failure Rounding Note   Subjective:    Placed on amio drip 10/14/14 after episodes of SVT.   After ~1 hr became acutely SOB. Amio d/c'd and given 1 dose 40 mg IV lasix.  Feels great today this morning.  No further SOB.  Has not been up out of bed apart from going to the bathroom, but no difficulties thus far. No lightheaded/dizziness with standing.  Remains on milrinone 0.25 Co-ox 59%. I/O relatively even. Weight stable. CVP 5   Objective:   Weight Range:  Vital Signs:   Temp:  [97.5 F (36.4 C)-98.1 F (36.7 C)] 97.7 F (36.5 C) (09/27 0359) Pulse Rate:  [102-115] 102 (09/26 2033) Resp:  [14-27] 18 (09/26 2033) BP: (97-123)/(43-67) 101/52 mmHg (09/27 0359) SpO2:  [97 %-100 %] 98 % (09/27 0359) Weight:  [170 lb 10.2 oz (77.4 kg)] 170 lb 10.2 oz (77.4 kg) (09/27 0500) Last BM Date: 10/14/14  Weight change: Filed Weights   10/13/14 2339 10/14/14 0303 10/15/14 0500  Weight: 170 lb 1.6 oz (77.157 kg) 170 lb 1.6 oz (77.157 kg) 170 lb 10.2 oz (77.4 kg)    Intake/Output:   Intake/Output Summary (Last 24 hours) at 10/15/14 [id number] Last data filed at 10/15/14 0500  Gross per 24 hour  Intake 1152.68 ml  Output   1200 ml  Net -47.32 ml     Physical Exam: General:lying in bed, NAD HEENT: Normal, without mass or lesion. Neck: Supple, no bruits. JVP 6-7 cm. Carotids 2+. No lymphadenopathy/thyromegaly noted. Heart: PMI nondisplaced. Tachy regular, no s4. +S3 no murmur appreciated Lungs:CTA Abdomen: Soft, NT, ND, No HSM, BS + x 4.  Extremities: No clubbing or cyanosis or edema RUE picc Neuro: Alert and oriented X 3. Moves all extremities spontaneously.  Psych: Affect flat, but appropriate  Telemetry: sinus tach 90-100s no further PSVT  Labs: Basic Metabolic Panel:  Recent Labs Lab 10/11/14 1459 10/12/14 0425 10/13/14 0430 10/14/14 0308 10/15/14 0505  NA 138 136 137 138 133*  K 4.1 3.5 3.7 3.8 3.7  CL 104 95* 95* 94* 91*  CO2 26 31 32 35* 34*   GLUCOSE 111* 108* 127* 115* 113*  BUN 22* 24* 25* 20 19  CREATININE 1.20* 1.37* 1.47* 1.29* 1.32*  CALCIUM 9.6 9.3 9.2 9.3 8.6*  MG 1.8  --   --   --   --     Liver Function Tests:  Recent Labs Lab 10/11/14 1459  AST 40  ALT 29  ALKPHOS 39  BILITOT 0.9  PROT 7.8  ALBUMIN 3.7   No results for input(s): LIPASE, AMYLASE in the last 168 hours. No results for input(s): AMMONIA in the last 168 hours.  CBC:  Recent Labs Lab 10/11/14 1459  WBC 6.8  NEUTROABS 4.4  HGB 10.2*  HCT 33.0*  MCV 88.7  PLT 185    Cardiac Enzymes:  Recent Labs Lab 10/11/14 1459 10/11/14 1957  TROPONINI 0.03 0.03    BNP: BNP (last 3 results)  Recent Labs  08/16/14 0855 08/19/14 1523 10/11/14 1459  BNP 1169.9* 1627.6* 2298.4*    ProBNP (last 3 results)  Recent Labs  09/25/14 1332  PROBNP 2219.0*      Other results:  Imaging: Dg Chest Port 1 View  10/14/2014   CLINICAL DATA:  Shortness of breath  EXAM: PORTABLE CHEST 1 VIEW  COMPARISON:  10/09/2014  FINDINGS: There is chronic elevation of the left diaphragm. Bibasilar patchy airspace disease unchanged compared with multiple prior exams which may reflect atelectasis  versus scarring versus less likely pneumonia. There is no pleural effusion or pneumothorax. There is stable cardiomegaly.  The osseous structures are unremarkable.  IMPRESSION: Bibasilar patchy airspace disease unchanged compared with multiple prior exams which may reflect atelectasis versus scarring versus less likely pneumonia.   Electronically Signed   By: Kathreen Devoid   On: 10/14/2014 11:50     Medications:     Scheduled Medications: . Chlorhexidine Gluconate Cloth  6 each Topical Q0600  . cholecalciferol  1,000 Units Oral Daily  . citalopram  10 mg Oral Daily  . levothyroxine  25 mcg Oral QAC breakfast  . multivitamin with minerals  1 tablet Oral Daily  . mupirocin ointment  1 application Nasal BID  . pantoprazole  40 mg Oral BID  . potassium chloride   10 mEq Oral Daily  . rivaroxaban  20 mg Oral Q supper  . sodium chloride  10-40 mL Intracatheter Q12H  . sodium chloride  3 mL Intravenous Q12H  . spironolactone  25 mg Oral Daily    Infusions: . milrinone 0.25 mcg/kg/min (10/15/14 0635)    PRN Medications: sodium chloride, acetaminophen, albuterol, ALPRAZolam, ondansetron (ZOFRAN) IV, oxyCODONE-acetaminophen, sodium chloride, sodium chloride, traMADol   Assessment:   1. Acute on Chronic Systolic Heart failure - NYHA Class IV Echo 10/02/14 EF 15% -> Cardiogenic shock 2. Recent PE/ DVT bilateral  3. CKD Stage 3 4. Hx of LBBB 5. Hypertension 6. Hypothryoidism 7. Depression 8. PSVT 9. Intolerance of amiodarone due to possible acute lung toxicity  Plan/Discussion:    Improved with milrinone and diuresis. She does not appear volume overloaded on exam.  HR better today. Start back on digoxin 0.0625 daily. Amio drip stopped after about an hour with acute SOB.   Weight stable after 1 dose 40 mg IV lasix yesterday. On Celexa 10 for depression.   Dispo: Today vs tomorrow with home milrinone.  PT/OT to see today.    Length of Stay: 4 Shirley Friar PA-C 10/15/2014, 7:12 AM  Advanced Heart Failure Team Pager [phone] (M-F; 7a - 4p)  Please contact [facility] for night-coverage after hours (4p -7a ) and weekends on [email or web address]  Patient seen and examined with Oda Kilts, PA-C. We discussed all aspects of the encounter. I agree with the assessment and plan as stated above.   Mild benefit from milrinone. Volume status much improved. Given PSVT will drop milrinone dose to 0.125. Will send home today on:  Milrinone 0.125 Lasix 40 mg daily Spiro 25 daily Digoxin 0.0625 Celexa 10 Xarelto 20 daily Synthroid  Kcl 10 daily  Stop carvedilol.   F/U: 1) Advanced HHRN/PT         2) HF Clinic next week with BMET and dig level   Bensimhon, Daniel,MD 10:38 AM

## 2014-10-16 DIAGNOSIS — R531 Weakness: Secondary | ICD-10-CM | POA: Diagnosis not present

## 2014-10-16 DIAGNOSIS — I509 Heart failure, unspecified: Secondary | ICD-10-CM | POA: Diagnosis not present

## 2014-10-16 DIAGNOSIS — Z86711 Personal history of pulmonary embolism: Secondary | ICD-10-CM | POA: Diagnosis not present

## 2014-10-16 DIAGNOSIS — Z9981 Dependence on supplemental oxygen: Secondary | ICD-10-CM | POA: Diagnosis not present

## 2014-10-16 DIAGNOSIS — Z7901 Long term (current) use of anticoagulants: Secondary | ICD-10-CM | POA: Diagnosis not present

## 2014-10-17 ENCOUNTER — Ambulatory Visit: Payer: Commercial Managed Care - HMO | Admitting: Cardiology

## 2014-10-17 DIAGNOSIS — Z9981 Dependence on supplemental oxygen: Secondary | ICD-10-CM | POA: Diagnosis not present

## 2014-10-17 DIAGNOSIS — R531 Weakness: Secondary | ICD-10-CM | POA: Diagnosis not present

## 2014-10-17 DIAGNOSIS — I509 Heart failure, unspecified: Secondary | ICD-10-CM | POA: Diagnosis not present

## 2014-10-17 DIAGNOSIS — Z7901 Long term (current) use of anticoagulants: Secondary | ICD-10-CM | POA: Diagnosis not present

## 2014-10-17 DIAGNOSIS — Z86711 Personal history of pulmonary embolism: Secondary | ICD-10-CM | POA: Diagnosis not present

## 2014-10-18 DIAGNOSIS — Z86711 Personal history of pulmonary embolism: Secondary | ICD-10-CM | POA: Diagnosis not present

## 2014-10-18 DIAGNOSIS — R531 Weakness: Secondary | ICD-10-CM | POA: Diagnosis not present

## 2014-10-18 DIAGNOSIS — I509 Heart failure, unspecified: Secondary | ICD-10-CM | POA: Diagnosis not present

## 2014-10-18 DIAGNOSIS — Z9981 Dependence on supplemental oxygen: Secondary | ICD-10-CM | POA: Diagnosis not present

## 2014-10-18 DIAGNOSIS — Z7901 Long term (current) use of anticoagulants: Secondary | ICD-10-CM | POA: Diagnosis not present

## 2014-10-18 NOTE — Patient Outreach (Signed)
[facility]) Care Management  10/18/2014  Alexandra [name] 02-Jul-1927 [id number]   Referral from Natividad Brood, RN, assigned Dannielle Huh, RN Erenest Rasher, RN covering) for transition of care calls.  Thanks, Ronnell Freshwater. [place], [place] Assistant Phone: [phone] Fax: [phone]

## 2014-10-19 DIAGNOSIS — I5043 Acute on chronic combined systolic (congestive) and diastolic (congestive) heart failure: Secondary | ICD-10-CM | POA: Diagnosis not present

## 2014-10-19 DIAGNOSIS — Z7901 Long term (current) use of anticoagulants: Secondary | ICD-10-CM | POA: Diagnosis not present

## 2014-10-19 DIAGNOSIS — Z86711 Personal history of pulmonary embolism: Secondary | ICD-10-CM | POA: Diagnosis not present

## 2014-10-19 DIAGNOSIS — R531 Weakness: Secondary | ICD-10-CM | POA: Diagnosis not present

## 2014-10-19 DIAGNOSIS — I509 Heart failure, unspecified: Secondary | ICD-10-CM | POA: Diagnosis not present

## 2014-10-19 DIAGNOSIS — J189 Pneumonia, unspecified organism: Secondary | ICD-10-CM | POA: Diagnosis not present

## 2014-10-19 DIAGNOSIS — Z9981 Dependence on supplemental oxygen: Secondary | ICD-10-CM | POA: Diagnosis not present

## 2014-10-19 DIAGNOSIS — I2699 Other pulmonary embolism without acute cor pulmonale: Secondary | ICD-10-CM | POA: Diagnosis not present

## 2014-10-20 ENCOUNTER — Other Ambulatory Visit (HOSPITAL_COMMUNITY): Payer: Self-pay | Admitting: Internal Medicine

## 2014-10-21 ENCOUNTER — Other Ambulatory Visit: Payer: Self-pay

## 2014-10-21 DIAGNOSIS — R531 Weakness: Secondary | ICD-10-CM | POA: Diagnosis not present

## 2014-10-21 DIAGNOSIS — Z86711 Personal history of pulmonary embolism: Secondary | ICD-10-CM | POA: Diagnosis not present

## 2014-10-21 DIAGNOSIS — Z7901 Long term (current) use of anticoagulants: Secondary | ICD-10-CM | POA: Diagnosis not present

## 2014-10-21 DIAGNOSIS — I509 Heart failure, unspecified: Secondary | ICD-10-CM | POA: Diagnosis not present

## 2014-10-21 DIAGNOSIS — Z9981 Dependence on supplemental oxygen: Secondary | ICD-10-CM | POA: Diagnosis not present

## 2014-10-21 NOTE — Patient Outreach (Addendum)
Unsuccessful attempt made to contact patient via telephone for transition of care.  HIPPA compliant message left at [phone].  Will make another attempt to contact patient on October 22, 2014.  Before completion of this documentation, patient returned call to this RNCM. Patient identified herself using HIPPA identifiers by providing date of birth and address.  Patient initially agreed to telephonic assessment, however, after a few minutes, she stated she was going to see Dr. Nani Ravens on Wednesday, she would talk to him about [facility] and I could call back later.  Call made to [facility] to request introduction be made about [facility] so patient will have a familiar introduction. Will call Monday, October 10 for further community care coordination assessment of needs.

## 2014-10-22 ENCOUNTER — Ambulatory Visit: Payer: Commercial Managed Care - HMO

## 2014-10-22 DIAGNOSIS — I2699 Other pulmonary embolism without acute cor pulmonale: Secondary | ICD-10-CM | POA: Diagnosis not present

## 2014-10-22 DIAGNOSIS — I509 Heart failure, unspecified: Secondary | ICD-10-CM | POA: Diagnosis not present

## 2014-10-22 DIAGNOSIS — I429 Cardiomyopathy, unspecified: Secondary | ICD-10-CM | POA: Diagnosis not present

## 2014-10-22 DIAGNOSIS — Z86711 Personal history of pulmonary embolism: Secondary | ICD-10-CM | POA: Diagnosis not present

## 2014-10-22 DIAGNOSIS — Z9981 Dependence on supplemental oxygen: Secondary | ICD-10-CM | POA: Diagnosis not present

## 2014-10-22 DIAGNOSIS — J189 Pneumonia, unspecified organism: Secondary | ICD-10-CM | POA: Diagnosis not present

## 2014-10-22 DIAGNOSIS — R531 Weakness: Secondary | ICD-10-CM | POA: Diagnosis not present

## 2014-10-22 DIAGNOSIS — Z7901 Long term (current) use of anticoagulants: Secondary | ICD-10-CM | POA: Diagnosis not present

## 2014-10-22 DIAGNOSIS — I5043 Acute on chronic combined systolic (congestive) and diastolic (congestive) heart failure: Secondary | ICD-10-CM | POA: Diagnosis not present

## 2014-10-22 LAB — BASIC METABOLIC PANEL WITH GFR
BUN: 25 mg/dL — AB (ref 4–21)
Creatinine: 1.3 mg/dL — AB (ref <=1.1)
Glucose: 100 mg/dL
Potassium: 4.6 mmol/L (ref 3.4–5.3)
Sodium: 135 mmol/L — AB (ref 137–147)

## 2014-10-22 LAB — CBC AND DIFFERENTIAL
HCT: 36 % (ref 36–46)
Hemoglobin: 11.5 g/dL — AB (ref 12.0–16.0)
Platelets: 274 K/µL (ref 150–399)
WBC: 7.1 10*3/mL

## 2014-10-23 ENCOUNTER — Ambulatory Visit (HOSPITAL_COMMUNITY)
Admit: 2014-10-23 | Discharge: 2014-10-23 | Disposition: A | Discharge status: Home / Self Care | Payer: Commercial Managed Care - HMO | Source: Ambulatory Visit | Attending: Internal Medicine | Admitting: Internal Medicine

## 2014-10-23 ENCOUNTER — Encounter: Payer: Self-pay | Admitting: *Deleted

## 2014-10-23 ENCOUNTER — Encounter (HOSPITAL_COMMUNITY): Payer: Self-pay | Admitting: Internal Medicine

## 2014-10-23 VITALS — BP 108/60 | HR 93 | Wt 169.8 lb

## 2014-10-23 DIAGNOSIS — I447 Left bundle-branch block, unspecified: Secondary | ICD-10-CM | POA: Diagnosis not present

## 2014-10-23 DIAGNOSIS — E039 Hypothyroidism, unspecified: Secondary | ICD-10-CM | POA: Insufficient documentation

## 2014-10-23 DIAGNOSIS — N183 Chronic kidney disease, stage 3 (moderate): Secondary | ICD-10-CM | POA: Insufficient documentation

## 2014-10-23 DIAGNOSIS — J45909 Unspecified asthma, uncomplicated: Secondary | ICD-10-CM | POA: Insufficient documentation

## 2014-10-23 DIAGNOSIS — Z79899 Other long term (current) drug therapy: Secondary | ICD-10-CM | POA: Diagnosis not present

## 2014-10-23 DIAGNOSIS — F4321 Adjustment disorder with depressed mood: Secondary | ICD-10-CM | POA: Insufficient documentation

## 2014-10-23 DIAGNOSIS — I129 Hypertensive chronic kidney disease with stage 1 through stage 4 chronic kidney disease, or unspecified chronic kidney disease: Secondary | ICD-10-CM | POA: Insufficient documentation

## 2014-10-23 DIAGNOSIS — I5022 Chronic systolic (congestive) heart failure: Secondary | ICD-10-CM | POA: Insufficient documentation

## 2014-10-23 DIAGNOSIS — Z823 Family history of stroke: Secondary | ICD-10-CM | POA: Insufficient documentation

## 2014-10-23 DIAGNOSIS — Z86711 Personal history of pulmonary embolism: Secondary | ICD-10-CM | POA: Insufficient documentation

## 2014-10-23 DIAGNOSIS — Z8249 Family history of ischemic heart disease and other diseases of the circulatory system: Secondary | ICD-10-CM | POA: Insufficient documentation

## 2014-10-23 DIAGNOSIS — Z7902 Long term (current) use of antithrombotics/antiplatelets: Secondary | ICD-10-CM | POA: Diagnosis not present

## 2014-10-23 DIAGNOSIS — M8580 Other specified disorders of bone density and structure, unspecified site: Secondary | ICD-10-CM | POA: Insufficient documentation

## 2014-10-23 DIAGNOSIS — G629 Polyneuropathy, unspecified: Secondary | ICD-10-CM | POA: Insufficient documentation

## 2014-10-23 DIAGNOSIS — Z833 Family history of diabetes mellitus: Secondary | ICD-10-CM | POA: Insufficient documentation

## 2014-10-23 DIAGNOSIS — Z86718 Personal history of other venous thrombosis and embolism: Secondary | ICD-10-CM | POA: Insufficient documentation

## 2014-10-23 DIAGNOSIS — E7800 Pure hypercholesterolemia, unspecified: Secondary | ICD-10-CM | POA: Insufficient documentation

## 2014-10-23 LAB — CARBOXYHEMOGLOBIN
Carboxyhemoglobin: 1.2 % (ref 0.5–1.5)
METHEMOGLOBIN: 0.6 % (ref 0.0–1.5)
O2 Saturation: 52.9 %
Total hemoglobin: 12.1 g/dL (ref 12.0–16.0)

## 2014-10-23 LAB — DIGOXIN LEVEL: Digoxin Level: 0.3 ng/mL — ABNORMAL LOW (ref 0.8–2.0)

## 2014-10-23 NOTE — Patient Instructions (Signed)
Follow up:3-4 weeks  Routine lab work today. (CoOx Dig) Will notify you of abnormal results

## 2014-10-23 NOTE — Progress Notes (Signed)
Advanced Heart Failure Note   Patient ID: Alexandra [name], female   DOB: 12/06/1927, 81 y.o.   MRN: [id number]  Primary Care: Dr. Melinda Crutch Primary Cardiologist: Dr Candee Furbish Primary HF: Dr Haroldine Laws  HPI:  Alexandra [name] is a 81 y.o. 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 Discharge weight was 196.  She was admitted in July 2016 for Abdominal pain and SOB. Her Gallbladder was thought to be causing her pain, so a lap choley was performed that admission. Echo on 08/15/14 showed EF 20%. There were thoughts that she may have stress induced CMP due to the death of her husband in 05-19-22 after several months of hospice care.   She was directly admitted to [facility] on 10/11/14 with concerns for low output with SBPs in 80s and tachycardia.  PICC line was placed with initial co-ox of 61%.CVP was 15. Milrinone started when co-ox dropped to 53%. She had SVT on milrinone 0.25 so started on amio. Developed acute SOB that improved quickly with cessation of amio and extra dose of IV lasix. Felt to possibly have acute amio toxicity. Milrinone decreased to 0.125 and no further PSVT. Sent home on milrinone 0.125. Diuresed well on 80 mg lasix IB BID. Discharge weight was 170.  She returns today for post hospital follow up. She did not feel very good for the first few days after the hospital. She is down 1 lb from discharge weight. Energy level still pretty low but over past few days she is starting to feel better. Says breathing is ok, does not get dyspneic with her home PT, not doing very much otherwise.  Limiting her fluid and salt intake. No dizziness or lightheadedness.  Weights at home 164.  Chronic orthopnea on 2-3 pillows. Has some trouble sleeping, takes a xanax and that seems to  help.  ECHO 10/02/14 LVEF 15% with diffuse hypokinesis, RV mildly dilated, normal function, PA peak pressure 59 mmHg   Past Medical History  Diagnosis Date  . Arthritis   . Headache(784.0)   . Anxiety   . Depression   . Asthma   . CAP (community acquired pneumonia) 12/19/2012  . Intermittent LBBB (left bundle branch block) 12/21/2012  . Obesity   . Peripheral neuropathy ([place])   . Sacroiliitis, not elsewhere classified ([place])   . Osteopenia   . Hypercholesteremia   . Neuropathy ([place])   . Trigger finger     [name]  . Chest pain     NM stress test 05/2011 Normal  . DOE (dyspnea on exertion)     No SOB at rest  . Fatigue     excertional  . breast ca dx'd 2000    left  . Essential (primary) hypertension 04/02/2014  . Adult hypothyroidism 04/02/2014  . Hereditary and idiopathic peripheral neuropathy 07/02/2014  . Leg weakness   . CKD (chronic kidney disease) stage 3, GFR 30-59 ml/min 08/13/2014  . Anemia of chronic disease 08/13/2014  . CHF (congestive heart failure) ([place]) 08/19/2014  . Situational depression     Current Outpatient Prescriptions  Medication Sig Dispense Refill  . acetaminophen (TYLENOL) 500 MG tablet Take 500 mg by mouth every 6 (six) hours as needed for mild pain.    Marland Kitchen alendronate (FOSAMAX) 70 MG  tablet Take 70 mg by mouth every Saturday. Take with a full glass of water on an empty stomach.    . ALPRAZolam (XANAX) 0.25 MG tablet Take 0.25 mg by mouth 3 (three) times daily as needed for anxiety.    Marland Kitchen b complex vitamins tablet Take 1 tablet by mouth every evening.     . calcium carbonate (TUMS - DOSED IN MG ELEMENTAL CALCIUM) 500 MG chewable tablet Chew 2 tablets by mouth daily as needed for indigestion or heartburn.    . Calcium-Vitamin D (CALTRATE 600 PLUS-VIT D PO) Take 1 tablet by mouth 2 (two) times daily.    . cholecalciferol (VITAMIN D) 1000 UNITS tablet Take 1,000 Units by mouth daily.    . citalopram (CELEXA) 10 MG tablet Take 1 tablet (10 mg total) by  mouth daily. 30 tablet 6  . digoxin 62.5 MCG TABS Take 0.0625 mg by mouth daily. 30 tablet 6  . furosemide (LASIX) 40 MG tablet Take 1 tablet (40 mg total) by mouth daily. 30 tablet 6  . lactose free nutrition (BOOST) LIQD Take 237 mLs by mouth 3 (three) times daily between meals.    Marland Kitchen levothyroxine (SYNTHROID, LEVOTHROID) 25 MCG tablet Take one tablet by mouth once daily in the morning before breakfast for thyroid    . milrinone (PRIMACOR) 20 MG/100ML SOLN infusion Inject 9.875 mcg/min into the vein continuous. 100 mL 3  . Multiple Vitamin (MULITIVITAMIN WITH MINERALS) TABS Take 1 tablet by mouth daily.    . OXYGEN Inhale 2 L into the lungs daily.    . pantoprazole (PROTONIX) 40 MG tablet Take 40 mg by mouth 2 (two) times daily.    . polyethylene glycol (MIRALAX / GLYCOLAX) packet Take 17 g by mouth daily as needed for mild constipation.    . potassium chloride (K-DUR) 10 MEQ tablet Take 1 tablet (10 mEq total) by mouth daily. 30 tablet 6  . promethazine (PHENERGAN) 25 MG tablet Take one tablet by mouth every 8 hours as needed for nausea    . sodium chloride (OCEAN) 0.65 % SOLN nasal spray Place 1 spray into both nostrils as needed for congestion.    Marland Kitchen spironolactone (ALDACTONE) 25 MG tablet Take 1 tablet (25 mg total) by mouth daily. 30 tablet 6  . VENTOLIN HFA 108 (90 BASE) MCG/ACT inhaler INhaLe 2 PufFS by mouth every 4 hours as needed for shortness of breath or wheezing  3  . XARELTO 20 MG TABS tablet TAKE 1 TABLET BY MOUTH EVERY DAY 30 tablet 0  . nitroGLYCERIN (NITROSTAT) 0.4 MG SL tablet Place 0.4 mg under the tongue every 5 (five) minutes as needed for chest pain.    . traMADol (ULTRAM) 50 MG tablet Take 1 tablet (50 mg total) by mouth every 6 (six) hours as needed for moderate pain. (Patient not taking: Reported on 10/23/2014) 60 tablet 3   No current facility-administered medications for this encounter.    Allergies  Allergen Reactions  . Amiodarone Shortness Of Breath and Nausea  And Vomiting  . Cymbalta [Duloxetine Hcl] Other (See Comments)    Depression  . Lyrica [Pregabalin] Other (See Comments)    Blurry vision      Social History   Social History  . Marital Status: Married    Spouse Name: N/A  . Number of Children: 5  . Years of Education: HS   Occupational History  . Retired    Social History Main Topics  . Smoking status: Never Smoker   . Smokeless  tobacco: Never Used  . Alcohol Use: No  . Drug Use: No  . Sexual Activity: Not on file   Other Topics Concern  . Not on file   Social History Narrative   Lives at home alone.   Right-handed.   Two cups caffeine daily (coffee).      Family History  Problem Relation Age of Onset  . Diabetes Brother   . Alzheimer's disease Sister   . CVA Daughter     01/21/09  . Pancreatic cancer Father 1  . Pneumonia Mother 42    cause of death  . Heart attack Neg Hx   . Stroke Daughter   . Hypertension Daughter     Danley Danker Vitals:   10/23/14 1357  BP: 108/60  Pulse: 93  Weight: 169 lb 12 oz (76.998 kg)  SpO2: 99%    PHYSICAL EXAM: General: In wheelchair, NAD HEENT: Normal, without mass or lesion. Neck: Supple, no bruits. JVP 6-7 cm. Carotids 2+. No lymphadenopathy/thyromegaly. Heart: PMI nondisplaced. Regular, no s4. +S3 no murmur. Lungs: CTA Abdomen: Soft, NT, ND, No HSM, BS + x 4.  Extremities: No clubbing or cyanosis or edema. RUE picc Neuro: Alert and oriented X 3. Moves all extremities spontaneously.  Psych: Affect pleaseant  ASSESSMENT & PLAN:  1. Chronic Systolic Heart failure - NYHA Class IV Echo 10/02/14 EF 15%  2. Recent PE/ DVT bilateral  3. CKD Stage 3  4. Hx of LBBB 5. Hypertension - stable on current regimen 6. Intolerance of amiodarone due to possible acute lung toxicity  Doing well on current regimen.  No BB in low output. No room to up-titrate meds at this time with soft BP.  BMET stable 10/22/14. Coox and dig level today. Follow up 3-4 weeks.   Legrand Como  "Jonni Sanger" Chalmers Cater, PA-C 10/23/2014   Patient seen and examined with Oda Kilts, PA-C. We discussed all aspects of the encounter. I agree with the assessment and plan as stated above.   She is relatively stable. With NYHA III HF. Volume status much improved but co-ox remains low despite milrinone. I am reluctant to titrate to higher dose in setting of SVT on milrinone 0.25. Options very limited. Will continue current regimen and see back in 3-4 weeks with echo. I encouraged her to get around as much as possible. We will continue to follow closely though not many other options at this point. Only other option might be to consider CRT (has wide LBBB). We will see how she does.   Aniqa Hare,MD 6:16 PM

## 2014-10-24 DIAGNOSIS — I509 Heart failure, unspecified: Secondary | ICD-10-CM | POA: Diagnosis not present

## 2014-10-24 DIAGNOSIS — Z9981 Dependence on supplemental oxygen: Secondary | ICD-10-CM | POA: Diagnosis not present

## 2014-10-24 DIAGNOSIS — Z86711 Personal history of pulmonary embolism: Secondary | ICD-10-CM | POA: Diagnosis not present

## 2014-10-24 DIAGNOSIS — R531 Weakness: Secondary | ICD-10-CM | POA: Diagnosis not present

## 2014-10-24 DIAGNOSIS — Z7901 Long term (current) use of anticoagulants: Secondary | ICD-10-CM | POA: Diagnosis not present

## 2014-10-25 DIAGNOSIS — Z9981 Dependence on supplemental oxygen: Secondary | ICD-10-CM | POA: Diagnosis not present

## 2014-10-25 DIAGNOSIS — Z7901 Long term (current) use of anticoagulants: Secondary | ICD-10-CM | POA: Diagnosis not present

## 2014-10-25 DIAGNOSIS — Z86711 Personal history of pulmonary embolism: Secondary | ICD-10-CM | POA: Diagnosis not present

## 2014-10-25 DIAGNOSIS — R531 Weakness: Secondary | ICD-10-CM | POA: Diagnosis not present

## 2014-10-25 DIAGNOSIS — I509 Heart failure, unspecified: Secondary | ICD-10-CM | POA: Diagnosis not present

## 2014-10-28 ENCOUNTER — Other Ambulatory Visit: Payer: Self-pay

## 2014-10-28 DIAGNOSIS — Z9981 Dependence on supplemental oxygen: Secondary | ICD-10-CM | POA: Diagnosis not present

## 2014-10-28 DIAGNOSIS — Z86711 Personal history of pulmonary embolism: Secondary | ICD-10-CM | POA: Diagnosis not present

## 2014-10-28 DIAGNOSIS — R531 Weakness: Secondary | ICD-10-CM | POA: Diagnosis not present

## 2014-10-28 DIAGNOSIS — Z7901 Long term (current) use of anticoagulants: Secondary | ICD-10-CM | POA: Diagnosis not present

## 2014-10-28 DIAGNOSIS — I509 Heart failure, unspecified: Secondary | ICD-10-CM | POA: Diagnosis not present

## 2014-10-28 NOTE — Patient Outreach (Signed)
Unsuccessful attempt made to contact patient via telephone at number [phone]. HIPPA compliant message left with this RNCM's contact information.    Plan: Make another attempt to contact patient on Friday, October 14.

## 2014-10-29 ENCOUNTER — Ambulatory Visit: Payer: Commercial Managed Care - HMO

## 2014-10-29 DIAGNOSIS — I509 Heart failure, unspecified: Secondary | ICD-10-CM | POA: Diagnosis not present

## 2014-10-29 DIAGNOSIS — I2699 Other pulmonary embolism without acute cor pulmonale: Secondary | ICD-10-CM | POA: Diagnosis not present

## 2014-10-29 DIAGNOSIS — Z9981 Dependence on supplemental oxygen: Secondary | ICD-10-CM | POA: Diagnosis not present

## 2014-10-29 DIAGNOSIS — R531 Weakness: Secondary | ICD-10-CM | POA: Diagnosis not present

## 2014-10-29 DIAGNOSIS — I429 Cardiomyopathy, unspecified: Secondary | ICD-10-CM | POA: Diagnosis not present

## 2014-10-29 DIAGNOSIS — Z7901 Long term (current) use of anticoagulants: Secondary | ICD-10-CM | POA: Diagnosis not present

## 2014-10-29 DIAGNOSIS — I5043 Acute on chronic combined systolic (congestive) and diastolic (congestive) heart failure: Secondary | ICD-10-CM | POA: Diagnosis not present

## 2014-10-29 DIAGNOSIS — J189 Pneumonia, unspecified organism: Secondary | ICD-10-CM | POA: Diagnosis not present

## 2014-10-29 DIAGNOSIS — Z452 Encounter for adjustment and management of vascular access device: Secondary | ICD-10-CM | POA: Diagnosis not present

## 2014-10-29 DIAGNOSIS — Z86711 Personal history of pulmonary embolism: Secondary | ICD-10-CM | POA: Diagnosis not present

## 2014-10-30 ENCOUNTER — Telehealth (HOSPITAL_COMMUNITY): Payer: Self-pay

## 2014-10-30 ENCOUNTER — Other Ambulatory Visit: Payer: Self-pay

## 2014-10-30 DIAGNOSIS — I471 Supraventricular tachycardia: Secondary | ICD-10-CM

## 2014-10-30 NOTE — Patient Outreach (Signed)
This  RNCM made telephone contact with patient, returning

## 2014-10-30 NOTE — Telephone Encounter (Signed)
Daughter called.  Daughter was helping her mother get situated for bed 69mn  Patient was sitting on edge of bed, daughter out the room when she heard a thump.  When the daughter got into mother's room her mother was face forward on floor.  Mother doesn't recall event so daughter assumes that she stood up and fell face forward  She called 9-1-1 who assessed her and felt everything was fine.  Feels generalized achiness today.    10am: BP 122/66, HR 109. O2 sat 95% without O2

## 2014-10-31 DIAGNOSIS — R531 Weakness: Secondary | ICD-10-CM | POA: Diagnosis not present

## 2014-10-31 DIAGNOSIS — Z7901 Long term (current) use of anticoagulants: Secondary | ICD-10-CM | POA: Diagnosis not present

## 2014-10-31 DIAGNOSIS — Z9981 Dependence on supplemental oxygen: Secondary | ICD-10-CM | POA: Diagnosis not present

## 2014-10-31 DIAGNOSIS — I509 Heart failure, unspecified: Secondary | ICD-10-CM | POA: Diagnosis not present

## 2014-10-31 DIAGNOSIS — Z86711 Personal history of pulmonary embolism: Secondary | ICD-10-CM | POA: Diagnosis not present

## 2014-10-31 NOTE — Telephone Encounter (Signed)
Please place 30-d event monitor

## 2014-11-01 ENCOUNTER — Ambulatory Visit: Payer: Commercial Managed Care - HMO

## 2014-11-01 DIAGNOSIS — Z7901 Long term (current) use of anticoagulants: Secondary | ICD-10-CM | POA: Diagnosis not present

## 2014-11-01 DIAGNOSIS — Z9981 Dependence on supplemental oxygen: Secondary | ICD-10-CM | POA: Diagnosis not present

## 2014-11-01 DIAGNOSIS — Z86711 Personal history of pulmonary embolism: Secondary | ICD-10-CM | POA: Diagnosis not present

## 2014-11-01 DIAGNOSIS — I509 Heart failure, unspecified: Secondary | ICD-10-CM | POA: Diagnosis not present

## 2014-11-01 DIAGNOSIS — R531 Weakness: Secondary | ICD-10-CM | POA: Diagnosis not present

## 2014-11-01 NOTE — Telephone Encounter (Signed)
Called daughter about the 30 day event monitor and placed order for it to be done at [address]

## 2014-11-04 ENCOUNTER — Telehealth (HOSPITAL_COMMUNITY): Payer: Self-pay

## 2014-11-04 DIAGNOSIS — Z86711 Personal history of pulmonary embolism: Secondary | ICD-10-CM | POA: Diagnosis not present

## 2014-11-04 DIAGNOSIS — Z9981 Dependence on supplemental oxygen: Secondary | ICD-10-CM | POA: Diagnosis not present

## 2014-11-04 DIAGNOSIS — I509 Heart failure, unspecified: Secondary | ICD-10-CM | POA: Diagnosis not present

## 2014-11-04 DIAGNOSIS — Z7901 Long term (current) use of anticoagulants: Secondary | ICD-10-CM | POA: Diagnosis not present

## 2014-11-04 DIAGNOSIS — R531 Weakness: Secondary | ICD-10-CM | POA: Diagnosis not present

## 2014-11-04 NOTE — Telephone Encounter (Signed)
Daughter Tessie Fass said that her mother seems depressed a little bit depressed and tired out.  Said her reading on her O2 have been 95-96% without the O2 on since 1000am this morning  Thinks she might still need the O2.  Daughter states she is passing water fine, no swelling or shortness of breath.  Told her that her mother does have O2 ordered and if she feels better with it on there is no reason for her not to have it is she thinks it makes her feel better

## 2014-11-05 ENCOUNTER — Other Ambulatory Visit (HOSPITAL_COMMUNITY): Payer: Self-pay | Admitting: Internal Medicine

## 2014-11-05 DIAGNOSIS — J189 Pneumonia, unspecified organism: Secondary | ICD-10-CM | POA: Diagnosis not present

## 2014-11-05 DIAGNOSIS — Z7901 Long term (current) use of anticoagulants: Secondary | ICD-10-CM | POA: Diagnosis not present

## 2014-11-05 DIAGNOSIS — I429 Cardiomyopathy, unspecified: Secondary | ICD-10-CM | POA: Diagnosis not present

## 2014-11-05 DIAGNOSIS — R531 Weakness: Secondary | ICD-10-CM | POA: Diagnosis not present

## 2014-11-05 DIAGNOSIS — Z86711 Personal history of pulmonary embolism: Secondary | ICD-10-CM | POA: Diagnosis not present

## 2014-11-05 DIAGNOSIS — Z9981 Dependence on supplemental oxygen: Secondary | ICD-10-CM | POA: Diagnosis not present

## 2014-11-05 DIAGNOSIS — Z452 Encounter for adjustment and management of vascular access device: Secondary | ICD-10-CM | POA: Diagnosis not present

## 2014-11-05 DIAGNOSIS — I471 Supraventricular tachycardia: Secondary | ICD-10-CM

## 2014-11-05 DIAGNOSIS — I509 Heart failure, unspecified: Secondary | ICD-10-CM | POA: Diagnosis not present

## 2014-11-05 DIAGNOSIS — I2699 Other pulmonary embolism without acute cor pulmonale: Secondary | ICD-10-CM | POA: Diagnosis not present

## 2014-11-05 DIAGNOSIS — I5043 Acute on chronic combined systolic (congestive) and diastolic (congestive) heart failure: Secondary | ICD-10-CM | POA: Diagnosis not present

## 2014-11-05 NOTE — Telephone Encounter (Signed)
[employer] and made arrangements for appointment for application of 30 day event monitor  She has other obligations and can not make the 11am appointment made for her tomorrow.  Number of [employer] given to call and schedule an appointment that works for her.  She is concerned about having too many devices to haul around with her

## 2014-11-06 ENCOUNTER — Ambulatory Visit: Payer: Commercial Managed Care - HMO

## 2014-11-06 ENCOUNTER — Encounter: Payer: Self-pay | Admitting: *Deleted

## 2014-11-06 ENCOUNTER — Other Ambulatory Visit: Payer: Self-pay

## 2014-11-06 DIAGNOSIS — I509 Heart failure, unspecified: Secondary | ICD-10-CM | POA: Diagnosis not present

## 2014-11-06 DIAGNOSIS — R531 Weakness: Secondary | ICD-10-CM | POA: Diagnosis not present

## 2014-11-06 DIAGNOSIS — Z7901 Long term (current) use of anticoagulants: Secondary | ICD-10-CM | POA: Diagnosis not present

## 2014-11-06 DIAGNOSIS — Z86711 Personal history of pulmonary embolism: Secondary | ICD-10-CM | POA: Diagnosis not present

## 2014-11-06 DIAGNOSIS — Z9981 Dependence on supplemental oxygen: Secondary | ICD-10-CM | POA: Diagnosis not present

## 2014-11-06 NOTE — Progress Notes (Signed)
Patient ID: Alexandra [name], female   DOB: 01/08/1928, 81 y.o.   MRN: [id number] Patient did not show up for 12/07/2014 11:00AM appointment to have a 30 day cardiac event monitor applied.

## 2014-11-06 NOTE — Patient Outreach (Signed)
Unsuccessful attempt made to contact patient via telephone for community care coordination. Will make another attempt to contact patient on tomorrow, October 20

## 2014-11-07 ENCOUNTER — Other Ambulatory Visit: Payer: Self-pay

## 2014-11-07 DIAGNOSIS — Z9981 Dependence on supplemental oxygen: Secondary | ICD-10-CM | POA: Diagnosis not present

## 2014-11-07 DIAGNOSIS — I509 Heart failure, unspecified: Secondary | ICD-10-CM | POA: Diagnosis not present

## 2014-11-07 DIAGNOSIS — R531 Weakness: Secondary | ICD-10-CM | POA: Diagnosis not present

## 2014-11-07 DIAGNOSIS — Z86711 Personal history of pulmonary embolism: Secondary | ICD-10-CM | POA: Diagnosis not present

## 2014-11-07 DIAGNOSIS — Z7901 Long term (current) use of anticoagulants: Secondary | ICD-10-CM | POA: Diagnosis not present

## 2014-11-07 NOTE — Patient Outreach (Signed)
Patient remains reluctant to having a home visit. Patient states she will talk to her doctor about [facility] tomorrow.  This RNCM supported patient in this decision by stating that it pays to be careful these days.  Further, patient advised that [facility] is partly owned by [employer] associated with [employer], including her primary care physician.    This RNCM and patient agreed to telephone contact on Monday, October 24.

## 2014-11-08 ENCOUNTER — Telehealth: Payer: Self-pay | Admitting: Cardiology

## 2014-11-08 ENCOUNTER — Other Ambulatory Visit (HOSPITAL_COMMUNITY): Payer: Self-pay

## 2014-11-08 ENCOUNTER — Other Ambulatory Visit (HOSPITAL_COMMUNITY): Payer: Self-pay | Admitting: Internal Medicine

## 2014-11-08 DIAGNOSIS — Z9981 Dependence on supplemental oxygen: Secondary | ICD-10-CM | POA: Diagnosis not present

## 2014-11-08 DIAGNOSIS — M8580 Other specified disorders of bone density and structure, unspecified site: Secondary | ICD-10-CM | POA: Diagnosis not present

## 2014-11-08 DIAGNOSIS — Z86711 Personal history of pulmonary embolism: Secondary | ICD-10-CM | POA: Diagnosis not present

## 2014-11-08 DIAGNOSIS — I509 Heart failure, unspecified: Secondary | ICD-10-CM | POA: Diagnosis not present

## 2014-11-08 DIAGNOSIS — R531 Weakness: Secondary | ICD-10-CM | POA: Diagnosis not present

## 2014-11-08 DIAGNOSIS — Z7901 Long term (current) use of anticoagulants: Secondary | ICD-10-CM | POA: Diagnosis not present

## 2014-11-08 NOTE — Telephone Encounter (Signed)
Patient and her dtr on phone. Asking about cost of event monitor.  Informed I would send a message to billing department to call them Monday to discuss this question. They also have more questions about this monitor and how often/long Dr. Haroldine Laws wants her wear this monitor.  Can she take off for shower?  How is she to handle this along with Milrinone infusion (alot to carry around)?  Advised that I would forward this to Dr. Clayborne Dana nurse to call them Monday to discuss. (Bensimhon ordered monitor) Aware that I will follow up on Tuesday (Im off Monday) to make sure they have questions addressed so that we may proceed with scheduling event monitor appt. Patient and dtr verbalized understanding and agreeable to plan.

## 2014-11-08 NOTE — Telephone Encounter (Signed)
New Message  Pt calling to speak w/ RN concerning what the details of the heart monitor. Please call back and discuss.

## 2014-11-11 ENCOUNTER — Other Ambulatory Visit: Payer: Self-pay

## 2014-11-11 ENCOUNTER — Other Ambulatory Visit (HOSPITAL_COMMUNITY): Payer: Self-pay | Admitting: Internal Medicine

## 2014-11-11 DIAGNOSIS — Z9981 Dependence on supplemental oxygen: Secondary | ICD-10-CM | POA: Diagnosis not present

## 2014-11-11 DIAGNOSIS — R531 Weakness: Secondary | ICD-10-CM | POA: Diagnosis not present

## 2014-11-11 DIAGNOSIS — Z7901 Long term (current) use of anticoagulants: Secondary | ICD-10-CM | POA: Diagnosis not present

## 2014-11-11 DIAGNOSIS — I509 Heart failure, unspecified: Secondary | ICD-10-CM | POA: Diagnosis not present

## 2014-11-11 DIAGNOSIS — Z86711 Personal history of pulmonary embolism: Secondary | ICD-10-CM | POA: Diagnosis not present

## 2014-11-11 NOTE — Patient Outreach (Signed)
Call made to discuss community care coordination and schedule transition of care home visit. Lady who answered the telephone advised this RNCM patient was getting a bath and could not talk at this time.  Will make another attempt to contact patient on tomorrow, October 25

## 2014-11-12 ENCOUNTER — Telehealth (HOSPITAL_COMMUNITY): Payer: Self-pay

## 2014-11-12 ENCOUNTER — Other Ambulatory Visit: Payer: Self-pay

## 2014-11-12 DIAGNOSIS — Z9981 Dependence on supplemental oxygen: Secondary | ICD-10-CM | POA: Diagnosis not present

## 2014-11-12 DIAGNOSIS — Z86711 Personal history of pulmonary embolism: Secondary | ICD-10-CM | POA: Diagnosis not present

## 2014-11-12 DIAGNOSIS — I5043 Acute on chronic combined systolic (congestive) and diastolic (congestive) heart failure: Secondary | ICD-10-CM | POA: Diagnosis not present

## 2014-11-12 DIAGNOSIS — I429 Cardiomyopathy, unspecified: Secondary | ICD-10-CM | POA: Diagnosis not present

## 2014-11-12 DIAGNOSIS — Z7901 Long term (current) use of anticoagulants: Secondary | ICD-10-CM | POA: Diagnosis not present

## 2014-11-12 DIAGNOSIS — R531 Weakness: Secondary | ICD-10-CM | POA: Diagnosis not present

## 2014-11-12 DIAGNOSIS — J189 Pneumonia, unspecified organism: Secondary | ICD-10-CM | POA: Diagnosis not present

## 2014-11-12 DIAGNOSIS — I509 Heart failure, unspecified: Secondary | ICD-10-CM | POA: Diagnosis not present

## 2014-11-12 DIAGNOSIS — I2699 Other pulmonary embolism without acute cor pulmonale: Secondary | ICD-10-CM | POA: Diagnosis not present

## 2014-11-12 DIAGNOSIS — Z452 Encounter for adjustment and management of vascular access device: Secondary | ICD-10-CM | POA: Diagnosis not present

## 2014-11-12 NOTE — Telephone Encounter (Signed)
Alexandra [name], PT would like an extension of her PT because they are just starting therapy and her 60 days will be up after two visits  [phone]

## 2014-11-13 ENCOUNTER — Other Ambulatory Visit (HOSPITAL_COMMUNITY): Payer: Self-pay | Admitting: Internal Medicine

## 2014-11-13 NOTE — Telephone Encounter (Signed)
F/u w/ patient.   She states that Bensimhon's office has not called her yet. Informed her that I would attempt to reach them and see if they can call her today. Pt's dtr is going to call and talk to our billing department about cost of this monitor. Explained that I would follow up with them by the end of the week to ensure they have been called by Dr. Clayborne Dana nurse. Patient and dtr verbalized understanding and agreeable to plan.

## 2014-11-13 NOTE — Telephone Encounter (Signed)
F/u   Pt waiting on call back from nurse. Please call pt today b/c she stated Sherri was suppose to call her back on yesterday.

## 2014-11-13 NOTE — Telephone Encounter (Signed)
Left message for Alexandra [name] PT

## 2014-11-13 NOTE — Telephone Encounter (Signed)
PT ok

## 2014-11-14 DIAGNOSIS — I509 Heart failure, unspecified: Secondary | ICD-10-CM | POA: Diagnosis not present

## 2014-11-14 DIAGNOSIS — Z9981 Dependence on supplemental oxygen: Secondary | ICD-10-CM | POA: Diagnosis not present

## 2014-11-14 DIAGNOSIS — I5043 Acute on chronic combined systolic (congestive) and diastolic (congestive) heart failure: Secondary | ICD-10-CM | POA: Diagnosis not present

## 2014-11-14 DIAGNOSIS — J189 Pneumonia, unspecified organism: Secondary | ICD-10-CM | POA: Diagnosis not present

## 2014-11-14 DIAGNOSIS — Z86711 Personal history of pulmonary embolism: Secondary | ICD-10-CM | POA: Diagnosis not present

## 2014-11-14 DIAGNOSIS — R531 Weakness: Secondary | ICD-10-CM | POA: Diagnosis not present

## 2014-11-14 DIAGNOSIS — Z7901 Long term (current) use of anticoagulants: Secondary | ICD-10-CM | POA: Diagnosis not present

## 2014-11-14 DIAGNOSIS — I2699 Other pulmonary embolism without acute cor pulmonale: Secondary | ICD-10-CM | POA: Diagnosis not present

## 2014-11-14 DIAGNOSIS — I429 Cardiomyopathy, unspecified: Secondary | ICD-10-CM | POA: Diagnosis not present

## 2014-11-14 NOTE — Telephone Encounter (Signed)
Called patient whom has received answers to her questions. She has made an appt for the event monitor.

## 2014-11-15 DIAGNOSIS — Z86711 Personal history of pulmonary embolism: Secondary | ICD-10-CM | POA: Diagnosis not present

## 2014-11-15 DIAGNOSIS — Z7901 Long term (current) use of anticoagulants: Secondary | ICD-10-CM | POA: Diagnosis not present

## 2014-11-15 DIAGNOSIS — I509 Heart failure, unspecified: Secondary | ICD-10-CM | POA: Diagnosis not present

## 2014-11-15 DIAGNOSIS — R531 Weakness: Secondary | ICD-10-CM | POA: Diagnosis not present

## 2014-11-15 DIAGNOSIS — Z9981 Dependence on supplemental oxygen: Secondary | ICD-10-CM | POA: Diagnosis not present

## 2014-11-18 ENCOUNTER — Ambulatory Visit: Payer: Commercial Managed Care - HMO

## 2014-11-18 ENCOUNTER — Telehealth (HOSPITAL_COMMUNITY): Payer: Self-pay

## 2014-11-18 NOTE — Telephone Encounter (Signed)
Verbal order given to Claiborne Billings PT from [place] for extension of PT from Dr. Haroldine Laws

## 2014-11-19 ENCOUNTER — Other Ambulatory Visit: Payer: Self-pay | Admitting: Adult Health

## 2014-11-19 ENCOUNTER — Other Ambulatory Visit (HOSPITAL_COMMUNITY): Payer: Self-pay | Admitting: Internal Medicine

## 2014-11-19 DIAGNOSIS — N183 Chronic kidney disease, stage 3 (moderate): Secondary | ICD-10-CM | POA: Diagnosis not present

## 2014-11-19 DIAGNOSIS — G609 Hereditary and idiopathic neuropathy, unspecified: Secondary | ICD-10-CM | POA: Diagnosis not present

## 2014-11-19 DIAGNOSIS — I509 Heart failure, unspecified: Secondary | ICD-10-CM | POA: Diagnosis not present

## 2014-11-19 DIAGNOSIS — I5023 Acute on chronic systolic (congestive) heart failure: Secondary | ICD-10-CM | POA: Diagnosis not present

## 2014-11-19 DIAGNOSIS — E669 Obesity, unspecified: Secondary | ICD-10-CM | POA: Diagnosis not present

## 2014-11-19 DIAGNOSIS — J189 Pneumonia, unspecified organism: Secondary | ICD-10-CM | POA: Diagnosis not present

## 2014-11-19 DIAGNOSIS — I429 Cardiomyopathy, unspecified: Secondary | ICD-10-CM | POA: Diagnosis not present

## 2014-11-19 DIAGNOSIS — I5043 Acute on chronic combined systolic (congestive) and diastolic (congestive) heart failure: Secondary | ICD-10-CM | POA: Diagnosis not present

## 2014-11-19 DIAGNOSIS — Z79899 Other long term (current) drug therapy: Secondary | ICD-10-CM | POA: Diagnosis not present

## 2014-11-19 DIAGNOSIS — Z9981 Dependence on supplemental oxygen: Secondary | ICD-10-CM | POA: Diagnosis not present

## 2014-11-19 DIAGNOSIS — I2699 Other pulmonary embolism without acute cor pulmonale: Secondary | ICD-10-CM | POA: Diagnosis not present

## 2014-11-19 DIAGNOSIS — R531 Weakness: Secondary | ICD-10-CM | POA: Diagnosis not present

## 2014-11-19 DIAGNOSIS — I129 Hypertensive chronic kidney disease with stage 1 through stage 4 chronic kidney disease, or unspecified chronic kidney disease: Secondary | ICD-10-CM | POA: Diagnosis not present

## 2014-11-19 DIAGNOSIS — Z452 Encounter for adjustment and management of vascular access device: Secondary | ICD-10-CM | POA: Diagnosis not present

## 2014-11-19 DIAGNOSIS — Z5181 Encounter for therapeutic drug level monitoring: Secondary | ICD-10-CM | POA: Diagnosis not present

## 2014-11-21 ENCOUNTER — Telehealth: Payer: Self-pay | Admitting: Cardiology

## 2014-11-21 ENCOUNTER — Encounter: Payer: Self-pay | Admitting: *Deleted

## 2014-11-21 ENCOUNTER — Telehealth (HOSPITAL_COMMUNITY): Payer: Self-pay | Admitting: *Deleted

## 2014-11-21 DIAGNOSIS — G609 Hereditary and idiopathic neuropathy, unspecified: Secondary | ICD-10-CM | POA: Diagnosis not present

## 2014-11-21 DIAGNOSIS — Z5181 Encounter for therapeutic drug level monitoring: Secondary | ICD-10-CM | POA: Diagnosis not present

## 2014-11-21 DIAGNOSIS — Z79899 Other long term (current) drug therapy: Secondary | ICD-10-CM | POA: Diagnosis not present

## 2014-11-21 DIAGNOSIS — Z9981 Dependence on supplemental oxygen: Secondary | ICD-10-CM | POA: Diagnosis not present

## 2014-11-21 DIAGNOSIS — I129 Hypertensive chronic kidney disease with stage 1 through stage 4 chronic kidney disease, or unspecified chronic kidney disease: Secondary | ICD-10-CM | POA: Diagnosis not present

## 2014-11-21 DIAGNOSIS — I5023 Acute on chronic systolic (congestive) heart failure: Secondary | ICD-10-CM | POA: Diagnosis not present

## 2014-11-21 DIAGNOSIS — Z452 Encounter for adjustment and management of vascular access device: Secondary | ICD-10-CM | POA: Diagnosis not present

## 2014-11-21 DIAGNOSIS — E669 Obesity, unspecified: Secondary | ICD-10-CM | POA: Diagnosis not present

## 2014-11-21 DIAGNOSIS — N183 Chronic kidney disease, stage 3 (moderate): Secondary | ICD-10-CM | POA: Diagnosis not present

## 2014-11-21 NOTE — Progress Notes (Signed)
Patient ID: Alexandra [name], female   DOB: September 11, 1927, 81 y.o.   MRN: [id number] Patient came in with daughter on 11/18/2014 to have a cardiac event monitor applied. After enrolling and reviewing monitor functions and instructions, patient declined to have it applied. The reason stated was that she had too much to handle at this time with the PICC line, Oxygen, and Therapy.  Patient was encouraged to discuss this with Dr. Haroldine Laws, and, possibly reschedule at a later date.

## 2014-11-21 NOTE — Telephone Encounter (Signed)
     Patient ID: Alexandra [name], female DOB: 1928/01/05, 81 y.o. MRN: [id number]  Patient came in with daughter on 11/18/2014 to have a cardiac event monitor applied.  After enrolling and reviewing monitor functions and instructions, patient declined to have it applied.  The reason stated was that she had too much to handle at this time with the PICC line, Oxygen, and Therapy. Patient was encouraged to discuss this with Dr. Haroldine Laws, and, possibly reschedule at a later date.        Received above message from Lollie Marrow, will send to Dr Haroldine Laws for review, he had ordered 10/12 b/c had a fall

## 2014-11-21 NOTE — Telephone Encounter (Signed)
New Message  Pt requesting to speak w/ RN concernign outisde referral. Please call back and discuss.

## 2014-11-21 NOTE — Telephone Encounter (Signed)
Follow up  Tantoprazole  40mg  pt daughter is calling to get a refill but Dr.Skains did not write the prescription   She wants to know if Dr.Skains will send one in to the pharmacy   Pt c/o of Chest Pain: STAT if CP now or developed within 24 hours  1. Are you having CP right now? no  2. Are you experiencing any other symptoms (ex. SOB, nausea, vomiting, sweating)? No  3. How long have you been experiencing CP? week  4. Is your CP continuous or coming and going?    coming and going  5. Have you taken Nitroglycerin? no ?

## 2014-11-21 NOTE — Telephone Encounter (Signed)
Thank you for update. Agree with Pam's assessment. Candee Furbish, MD

## 2014-11-21 NOTE — Telephone Encounter (Signed)
Spoke with both pt and daughter.   First concern was for getting a refill for Protonix.  Advised to contact PCP. Second concern was pt is not sure Milrinone is helping her and she is asking if she should get a second opinion at a different health care facility.  Advised while that is certainly her right as a patient, I feel Dr Haroldine Laws is an exceptional specialist in the care and treatment of pts with CHF and I doubt she could be treated anymore thoroughly elsewhere.  She is asking if her insurance considers her seeing him as a second opinion or if she could see someone else as a second opinion.  Advised she will need to check with her insurance company for that to be determined.   Third concern was a dull, not deep or sharp chest pain that comes and goes without radiation, no relation to movement or eating.  No N/v or SOB.  Chest discomfort occurs under both right and left breast from time to time and can last for a couple of "minutes."  She does report recent arm exercises with PT but no known injury.  Advised to continue to monitor.  If chest pain reoccurs and or worsens she can use SL Ntg but to be very careful due to already lower BP.  They will let us know if CP/discomfort reoccurs.

## 2014-11-22 ENCOUNTER — Telehealth (HOSPITAL_COMMUNITY): Payer: Self-pay

## 2014-11-22 DIAGNOSIS — Z452 Encounter for adjustment and management of vascular access device: Secondary | ICD-10-CM | POA: Diagnosis not present

## 2014-11-22 DIAGNOSIS — G609 Hereditary and idiopathic neuropathy, unspecified: Secondary | ICD-10-CM | POA: Diagnosis not present

## 2014-11-22 DIAGNOSIS — Z79899 Other long term (current) drug therapy: Secondary | ICD-10-CM | POA: Diagnosis not present

## 2014-11-22 DIAGNOSIS — I129 Hypertensive chronic kidney disease with stage 1 through stage 4 chronic kidney disease, or unspecified chronic kidney disease: Secondary | ICD-10-CM | POA: Diagnosis not present

## 2014-11-22 DIAGNOSIS — Z9981 Dependence on supplemental oxygen: Secondary | ICD-10-CM | POA: Diagnosis not present

## 2014-11-22 DIAGNOSIS — E669 Obesity, unspecified: Secondary | ICD-10-CM | POA: Diagnosis not present

## 2014-11-22 DIAGNOSIS — Z5181 Encounter for therapeutic drug level monitoring: Secondary | ICD-10-CM | POA: Diagnosis not present

## 2014-11-22 DIAGNOSIS — N183 Chronic kidney disease, stage 3 (moderate): Secondary | ICD-10-CM | POA: Diagnosis not present

## 2014-11-22 DIAGNOSIS — I5023 Acute on chronic systolic (congestive) heart failure: Secondary | ICD-10-CM | POA: Diagnosis not present

## 2014-11-22 MED ORDER — NITROGLYCERIN 0.4 MG SL SUBL: 0.4000 mg | SUBLINGUAL_TABLET | SUBLINGUAL | Status: DC | PRN

## 2014-11-22 NOTE — Telephone Encounter (Signed)
Patient canceled her appointment with Dr. Rinaldo Cloud at [facility]

## 2014-11-22 NOTE — Telephone Encounter (Signed)
Kaitlyn from [place] called and said that patient had an episode of chest pain relieved by an old nitro.  Needs refill; sent in

## 2014-11-25 DIAGNOSIS — Z452 Encounter for adjustment and management of vascular access device: Secondary | ICD-10-CM | POA: Diagnosis not present

## 2014-11-25 DIAGNOSIS — G609 Hereditary and idiopathic neuropathy, unspecified: Secondary | ICD-10-CM | POA: Diagnosis not present

## 2014-11-25 DIAGNOSIS — E669 Obesity, unspecified: Secondary | ICD-10-CM | POA: Diagnosis not present

## 2014-11-25 DIAGNOSIS — I129 Hypertensive chronic kidney disease with stage 1 through stage 4 chronic kidney disease, or unspecified chronic kidney disease: Secondary | ICD-10-CM | POA: Diagnosis not present

## 2014-11-25 DIAGNOSIS — N183 Chronic kidney disease, stage 3 (moderate): Secondary | ICD-10-CM | POA: Diagnosis not present

## 2014-11-25 DIAGNOSIS — Z9981 Dependence on supplemental oxygen: Secondary | ICD-10-CM | POA: Diagnosis not present

## 2014-11-25 DIAGNOSIS — I5023 Acute on chronic systolic (congestive) heart failure: Secondary | ICD-10-CM | POA: Diagnosis not present

## 2014-11-25 DIAGNOSIS — Z79899 Other long term (current) drug therapy: Secondary | ICD-10-CM | POA: Diagnosis not present

## 2014-11-25 DIAGNOSIS — Z5181 Encounter for therapeutic drug level monitoring: Secondary | ICD-10-CM | POA: Diagnosis not present

## 2014-11-26 ENCOUNTER — Other Ambulatory Visit (HOSPITAL_COMMUNITY): Payer: Self-pay | Admitting: Internal Medicine

## 2014-11-26 DIAGNOSIS — Z5181 Encounter for therapeutic drug level monitoring: Secondary | ICD-10-CM | POA: Diagnosis not present

## 2014-11-26 DIAGNOSIS — I5043 Acute on chronic combined systolic (congestive) and diastolic (congestive) heart failure: Secondary | ICD-10-CM | POA: Diagnosis not present

## 2014-11-26 DIAGNOSIS — I129 Hypertensive chronic kidney disease with stage 1 through stage 4 chronic kidney disease, or unspecified chronic kidney disease: Secondary | ICD-10-CM | POA: Diagnosis not present

## 2014-11-26 DIAGNOSIS — E669 Obesity, unspecified: Secondary | ICD-10-CM | POA: Diagnosis not present

## 2014-11-26 DIAGNOSIS — Z452 Encounter for adjustment and management of vascular access device: Secondary | ICD-10-CM | POA: Diagnosis not present

## 2014-11-26 DIAGNOSIS — I509 Heart failure, unspecified: Secondary | ICD-10-CM | POA: Diagnosis not present

## 2014-11-26 DIAGNOSIS — Z79899 Other long term (current) drug therapy: Secondary | ICD-10-CM | POA: Diagnosis not present

## 2014-11-26 DIAGNOSIS — I429 Cardiomyopathy, unspecified: Secondary | ICD-10-CM | POA: Diagnosis not present

## 2014-11-26 DIAGNOSIS — N183 Chronic kidney disease, stage 3 (moderate): Secondary | ICD-10-CM | POA: Diagnosis not present

## 2014-11-26 DIAGNOSIS — G609 Hereditary and idiopathic neuropathy, unspecified: Secondary | ICD-10-CM | POA: Diagnosis not present

## 2014-11-26 DIAGNOSIS — I5023 Acute on chronic systolic (congestive) heart failure: Secondary | ICD-10-CM | POA: Diagnosis not present

## 2014-11-26 DIAGNOSIS — J189 Pneumonia, unspecified organism: Secondary | ICD-10-CM | POA: Diagnosis not present

## 2014-11-26 DIAGNOSIS — I2699 Other pulmonary embolism without acute cor pulmonale: Secondary | ICD-10-CM | POA: Diagnosis not present

## 2014-11-26 DIAGNOSIS — Z9981 Dependence on supplemental oxygen: Secondary | ICD-10-CM | POA: Diagnosis not present

## 2014-11-26 DIAGNOSIS — R531 Weakness: Secondary | ICD-10-CM | POA: Diagnosis not present

## 2014-11-27 ENCOUNTER — Ambulatory Visit (HOSPITAL_COMMUNITY)
Admission: RE | Admit: 2014-11-27 | Discharge: 2014-11-27 | Disposition: A | Discharge status: Home / Self Care | Payer: Commercial Managed Care - HMO | Source: Ambulatory Visit | Attending: Internal Medicine | Admitting: Internal Medicine

## 2014-11-27 VITALS — BP 118/64 | HR 120 | Wt 164.8 lb

## 2014-11-27 DIAGNOSIS — Z79899 Other long term (current) drug therapy: Secondary | ICD-10-CM | POA: Diagnosis not present

## 2014-11-27 DIAGNOSIS — Z86711 Personal history of pulmonary embolism: Secondary | ICD-10-CM | POA: Diagnosis not present

## 2014-11-27 DIAGNOSIS — Z7902 Long term (current) use of antithrombotics/antiplatelets: Secondary | ICD-10-CM | POA: Insufficient documentation

## 2014-11-27 DIAGNOSIS — Z823 Family history of stroke: Secondary | ICD-10-CM | POA: Insufficient documentation

## 2014-11-27 DIAGNOSIS — I5022 Chronic systolic (congestive) heart failure: Secondary | ICD-10-CM | POA: Insufficient documentation

## 2014-11-27 DIAGNOSIS — F329 Major depressive disorder, single episode, unspecified: Secondary | ICD-10-CM | POA: Diagnosis not present

## 2014-11-27 DIAGNOSIS — E039 Hypothyroidism, unspecified: Secondary | ICD-10-CM | POA: Insufficient documentation

## 2014-11-27 DIAGNOSIS — I130 Hypertensive heart and chronic kidney disease with heart failure and stage 1 through stage 4 chronic kidney disease, or unspecified chronic kidney disease: Secondary | ICD-10-CM | POA: Diagnosis not present

## 2014-11-27 DIAGNOSIS — Z86718 Personal history of other venous thrombosis and embolism: Secondary | ICD-10-CM | POA: Diagnosis not present

## 2014-11-27 DIAGNOSIS — F419 Anxiety disorder, unspecified: Secondary | ICD-10-CM | POA: Insufficient documentation

## 2014-11-27 DIAGNOSIS — Z7983 Long term (current) use of bisphosphonates: Secondary | ICD-10-CM | POA: Diagnosis not present

## 2014-11-27 DIAGNOSIS — Z8249 Family history of ischemic heart disease and other diseases of the circulatory system: Secondary | ICD-10-CM | POA: Insufficient documentation

## 2014-11-27 DIAGNOSIS — J45909 Unspecified asthma, uncomplicated: Secondary | ICD-10-CM | POA: Insufficient documentation

## 2014-11-27 DIAGNOSIS — Z853 Personal history of malignant neoplasm of breast: Secondary | ICD-10-CM | POA: Insufficient documentation

## 2014-11-27 DIAGNOSIS — Z833 Family history of diabetes mellitus: Secondary | ICD-10-CM | POA: Diagnosis not present

## 2014-11-27 DIAGNOSIS — I447 Left bundle-branch block, unspecified: Secondary | ICD-10-CM | POA: Diagnosis not present

## 2014-11-27 DIAGNOSIS — N183 Chronic kidney disease, stage 3 (moderate): Secondary | ICD-10-CM | POA: Diagnosis not present

## 2014-11-27 DIAGNOSIS — E7800 Pure hypercholesterolemia, unspecified: Secondary | ICD-10-CM | POA: Diagnosis not present

## 2014-11-27 DIAGNOSIS — G629 Polyneuropathy, unspecified: Secondary | ICD-10-CM | POA: Insufficient documentation

## 2014-11-27 LAB — CARBOXYHEMOGLOBIN
CARBOXYHEMOGLOBIN: 1.3 % (ref 0.5–1.5)
Methemoglobin: 0.7 % (ref 0.0–1.5)
O2 SAT: 48.8 %
TOTAL HEMOGLOBIN: 12.3 g/dL (ref 12.0–16.0)

## 2014-11-27 MED ORDER — CITALOPRAM HYDROBROMIDE 20 MG PO TABS: 20.0000 mg | ORAL_TABLET | Freq: Every day | ORAL | Status: DC

## 2014-11-27 NOTE — Patient Instructions (Signed)
INCREASE Celexa to 20 mg, one tab daily  You have been referred to [place] following things EVERYDAY: 1) Weigh yourself in the morning before breakfast. Write it down and keep it in a log. 2) Take your medicines as prescribed 3) Eat low salt foods-Limit salt (sodium) to 2000 mg per day.  4) Stay as active as you can everyday 5) Limit all fluids for the day to less than 2 liters 6)

## 2014-11-27 NOTE — Progress Notes (Signed)
Advanced Heart Failure Medication Review by a Pharmacist  Does the patient  feel that his/her medications are working for him/her?  yes  Has the patient been experiencing any side effects to the medications prescribed?  no  Does the patient measure his/her own blood pressure or blood glucose at home?  yes   Does the patient have any problems obtaining medications due to transportation or finances?   no  Understanding of regimen: good Understanding of indications: good Potential of compliance: good Patient understands to avoid NSAIDs. Patient understands to avoid decongestants.  Issues to address at subsequent visits: None   Pharmacist comments:  Ms. Heidebrecht is a pleasant 81 yo F presenting with her daughter and son and a current medication list. She reports excellent compliance with her medications. We did review the indications for each of her medications and directions for use of her NTG tablets. She did not have any other medication-related questions or concerns at this time.   [name]. Velva Harman, PharmD, BCPS, CPP Clinical Pharmacist Pager: [phone] Phone: [phone] 11/27/2014 2:46 PM      Time with patient: 14 minutes Preparation and documentation time: 2 minutes Total time: 16 minutes

## 2014-11-27 NOTE — Progress Notes (Signed)
Advanced Heart Failure Clinic Note   Patient ID: Alexandra [name], female   DOB: Oct 15, 1927, 81 y.o.   MRN: [id number]  Primary Care: Dr. Melinda Crutch Primary Cardiologist: Dr Candee Furbish Primary HF: Dr Haroldine Laws  HPI: Alexandra [name] is a 81 y.o. 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 Discharge weight was 196.  She was admitted in July 2016 for Abdominal pain and SOB. Her Gallbladder was thought to be causing her pain, so a lap choley was performed that admission. Echo on 08/15/14 showed EF 20%. There were thoughts that she may have stress induced CMP due to the death of her husband in May 23, 2022 after several months of hospice care.   She was directly admitted to [facility] on 10/11/14 with concerns for low output with SBPs in 80s and tachycardia.  PICC line was placed with initial co-ox of 61%.CVP was 15. Milrinone started when co-ox dropped to 53%. She had SVT on milrinone 0.25 so started on amio. Developed acute SOB that improved quickly with cessation of amio and extra dose of IV lasix. Felt to possibly have acute amio toxicity. Milrinone decreased to 0.125 and no further PSVT. Sent home on milrinone 0.125. Diuresed well on 80 mg lasix IB BID. Discharge weight was 170.  She returns today for regular follow up. She is down ~ 6 lbs from discharge. Coox 52.9% 10/23/14. Still feels pretty fatigued at home, especially in the morning, but is progressing with PT. Has been able to wean off of 02. We had scheduled pt to get cardiac event monitor applied, but she refused, saying she had too much going on right now. Has had occasional CP, dull, on either side, recently refilled her NTG.  She has also asked for a second opinion concerning her CHF care, with poor improvement of symptoms on  milrinone. Limiting her fluid and salt intake. Occasional lightheadedness unrelated to activity except for stress, thinks may be anxiety. Weights at home 161.  Able to sleep more flat, on 2 small pillows. Has been sleeping better overall.  ECHO 10/02/14 LVEF 15% with diffuse hypokinesis, RV mildly dilated, normal function, PA peak pressure 59 mmHg   Past Medical History  Diagnosis Date  . Arthritis   . Headache(784.0)   . Anxiety   . Depression   . Asthma   . CAP (community acquired pneumonia) 12/19/2012  . Intermittent LBBB (left bundle branch block) 12/21/2012  . Obesity   . Peripheral neuropathy ([place])   . Sacroiliitis, not elsewhere classified ([place])   . Osteopenia   . Hypercholesteremia   . Neuropathy ([place])   . Trigger finger     [name]  . Chest pain     NM stress test 05/2011 Normal  . DOE (dyspnea on exertion)     No SOB at rest  . Fatigue     excertional  . breast ca dx'd 2000    left  . Essential (primary) hypertension 04/02/2014  . Adult hypothyroidism 04/02/2014  . Hereditary and idiopathic peripheral neuropathy 07/02/2014  . Leg weakness   . CKD (chronic kidney disease) stage 3, GFR 30-59 ml/min 08/13/2014  . Anemia of chronic disease 08/13/2014  . CHF (congestive heart failure) ([place]) 08/19/2014  . Situational depression     Current Outpatient  Prescriptions  Medication Sig Dispense Refill  . acetaminophen (TYLENOL) 500 MG tablet Take 500 mg by mouth every 6 (six) hours as needed for mild pain.    Marland Kitchen alendronate (FOSAMAX) 70 MG tablet Take 70 mg by mouth every Saturday. Take with a full glass of water on an empty stomach.    . ALPRAZolam (XANAX) 0.25 MG tablet Take 0.25 mg by mouth 3 (three) times daily as needed for anxiety.    Marland Kitchen b complex vitamins tablet Take 1 tablet by mouth every evening.     . calcium carbonate (TUMS - DOSED IN MG ELEMENTAL CALCIUM) 500 MG chewable tablet Chew 2 tablets by mouth daily as needed for indigestion or heartburn.    . Calcium-Vitamin  D (CALTRATE 600 PLUS-VIT D PO) Take 1 tablet by mouth 2 (two) times daily.    . cholecalciferol (VITAMIN D) 1000 UNITS tablet Take 1,000 Units by mouth daily.    . citalopram (CELEXA) 10 MG tablet Take 1 tablet (10 mg total) by mouth daily. 30 tablet 6  . digoxin 62.5 MCG TABS Take 0.0625 mg by mouth daily. 30 tablet 6  . furosemide (LASIX) 40 MG tablet Take 1 tablet (40 mg total) by mouth daily. 30 tablet 6  . lactose free nutrition (BOOST) LIQD Take 237 mLs by mouth 3 (three) times daily between meals.    Marland Kitchen levothyroxine (SYNTHROID, LEVOTHROID) 25 MCG tablet Take one tablet by mouth once daily in the morning before breakfast for thyroid    . milrinone (PRIMACOR) 20 MG/100ML SOLN infusion Inject 9.875 mcg/min into the vein continuous. 100 mL 3  . Multiple Vitamin (MULITIVITAMIN WITH MINERALS) TABS Take 1 tablet by mouth daily.    . nitroGLYCERIN (NITROSTAT) 0.4 MG SL tablet Place 1 tablet (0.4 mg total) under the tongue every 5 (five) minutes as needed for chest pain. 25 tablet 4  . OXYGEN Inhale 2 L into the lungs daily.    . pantoprazole (PROTONIX) 40 MG tablet Take 40 mg by mouth 2 (two) times daily.    . polyethylene glycol (MIRALAX / GLYCOLAX) packet Take 17 g by mouth daily as needed for mild constipation.    . potassium chloride (K-DUR) 10 MEQ tablet Take 1 tablet (10 mEq total) by mouth daily. 30 tablet 6  . promethazine (PHENERGAN) 25 MG tablet Take one tablet by mouth every 8 hours as needed for nausea    . sodium chloride (OCEAN) 0.65 % SOLN nasal spray Place 1 spray into both nostrils as needed for congestion.    Marland Kitchen spironolactone (ALDACTONE) 25 MG tablet Take 1 tablet (25 mg total) by mouth daily. 30 tablet 6  . traMADol (ULTRAM) 50 MG tablet Take 1 tablet (50 mg total) by mouth every 6 (six) hours as needed for moderate pain. (Patient not taking: Reported on 10/23/2014) 60 tablet 3  . VENTOLIN HFA 108 (90 BASE) MCG/ACT inhaler INhaLe 2 PufFS by mouth every 4 hours as needed for  shortness of breath or wheezing  3  . XARELTO 20 MG TABS tablet TAKE 1 TABLET BY MOUTH EVERY DAY 30 tablet 0   No current facility-administered medications for this encounter.    Allergies  Allergen Reactions  . Amiodarone Shortness Of Breath and Nausea And Vomiting  . Cymbalta [Duloxetine Hcl] Other (See Comments)    Depression  . Lyrica [Pregabalin] Other (See Comments)    Blurry vision      Social History   Social History  . Marital Status: Married  Spouse Name: N/A  . Number of Children: 5  . Years of Education: HS   Occupational History  . Retired    Social History Main Topics  . Smoking status: Never Smoker   . Smokeless tobacco: Never Used  . Alcohol Use: No  . Drug Use: No  . Sexual Activity: Not on file   Other Topics Concern  . Not on file   Social History Narrative   Lives at home alone.   Right-handed.   Two cups caffeine daily (coffee).      Family History  Problem Relation Age of Onset  . Diabetes Brother   . Alzheimer's disease Sister   . CVA Daughter     01/21/09  . Pancreatic cancer Father 68  . Pneumonia Mother 47    cause of death  . Heart attack Neg Hx   . Stroke Daughter   . Hypertension Daughter     Alexandra [name] Vitals:   11/27/14 1411  BP: 118/64  Pulse: 120  Weight: 164 lb 12.8 oz (74.753 kg)  SpO2: 94%    PHYSICAL EXAM: General: In wheelchair, NAD HEENT: Normal, without mass or lesion. Neck: Supple, no bruits. JVP 6-7 cm. Carotids 2+. No thyromegaly or nodule. Heart: PMI nondisplaced  tachy, no s4. +S3, no murmur. Lungs: Slightly diminished R base Abdomen: Soft, NT, ND, no HSM +BS x 4.  Extremities: No clubbing, cyanosis. Trace ankle edema. RUE picc Neuro: Alert and oriented X 3. Moves all extremities spontaneously.  Psych: Affect pleaseant  EKG: Sinus tach 114, LAD, LBBB 145 ms ASSESSMENT & PLAN:  1. Chronic Systolic Heart failure - NYHA Class IV Echo 10/02/14 EF 15%  2. Recent PE/ DVT bilateral  3. CKD  Stage 3  4. Hx of LBBB 5. Hypertension - stable on current regimen 6. Intolerance of amiodarone due to possible acute lung toxicity  Doing OK on current regimen, but overall feels little to no improvement. Little room to increase. BP improved. No BB in low output. Don't want to uptitrate milrinone with SVT at higher doses. Could consider switch to dobutamine but pt unwilling at this time.  Discussed consideration for BiV pacemaker, pt very hesitant. Willing to be referred to EP.  BMET stable 11/116. Coox today. Follow up 3-4 weeks.   Legrand Como "Jonni Sanger" [place], PA-C 11/27/2014    Patient seen and examined with Oda Kilts, PA-C. We discussed all aspects of the encounter. I agree with the assessment and plan as stated above.   She remains quite symptomatic (NYHA IIIB) with end-stage HF despite initiation of milrinone and control of her volume status. Extensive discussion with her and her family about multiple issues and options. We discussed the fact that she has Stage D HF and given her age the only options at this point are home inotropes and consideration of Hospice. However given LBBB CRT-P may be an option though utility may be compromised by how advanced her HF is.   I told them we could increase her inotropes or considering switching milrinone to dobutamine but this was not a durable option and did carry some risk of high-grade arrhythmias. They will discuss and see if they would like to increase. We also discussed role of cardiac cath but given suspicion for NICM, lack of angina and not surgical candidate I told them I wasn't sure that this would change our management.  For now they are interested in talking to EP about the possibility of CRT device. They also raised the possibility of getting a  2nd opinion from Dr. Cloretta Ned at [facility] or another HF doctor. I told them that I knew Dr. Edwin Dada well and I think he is superb and I would completely support this decision if they wanted to go  that route. I also gave them several names of other docs they could also consider as her insurance doesn't cover [facility] referrals.   Total time spent 50 minutes. Over half that time spent discussing above.   Esli Jernigan,MD 6:34 PM   Addendum; Co-ox 49% on milrinone 0.125. Confirms low-output.   Kanin Lia,MD 6:36 PM

## 2014-11-28 DIAGNOSIS — Z5181 Encounter for therapeutic drug level monitoring: Secondary | ICD-10-CM | POA: Diagnosis not present

## 2014-11-28 DIAGNOSIS — E669 Obesity, unspecified: Secondary | ICD-10-CM | POA: Diagnosis not present

## 2014-11-28 DIAGNOSIS — I5023 Acute on chronic systolic (congestive) heart failure: Secondary | ICD-10-CM | POA: Diagnosis not present

## 2014-11-28 DIAGNOSIS — G609 Hereditary and idiopathic neuropathy, unspecified: Secondary | ICD-10-CM | POA: Diagnosis not present

## 2014-11-28 DIAGNOSIS — Z452 Encounter for adjustment and management of vascular access device: Secondary | ICD-10-CM | POA: Diagnosis not present

## 2014-11-28 DIAGNOSIS — I129 Hypertensive chronic kidney disease with stage 1 through stage 4 chronic kidney disease, or unspecified chronic kidney disease: Secondary | ICD-10-CM | POA: Diagnosis not present

## 2014-11-28 DIAGNOSIS — N183 Chronic kidney disease, stage 3 (moderate): Secondary | ICD-10-CM | POA: Diagnosis not present

## 2014-11-28 DIAGNOSIS — Z79899 Other long term (current) drug therapy: Secondary | ICD-10-CM | POA: Diagnosis not present

## 2014-11-28 DIAGNOSIS — Z9981 Dependence on supplemental oxygen: Secondary | ICD-10-CM | POA: Diagnosis not present

## 2014-11-29 DIAGNOSIS — Z452 Encounter for adjustment and management of vascular access device: Secondary | ICD-10-CM | POA: Diagnosis not present

## 2014-11-29 DIAGNOSIS — G609 Hereditary and idiopathic neuropathy, unspecified: Secondary | ICD-10-CM | POA: Diagnosis not present

## 2014-11-29 DIAGNOSIS — Z79899 Other long term (current) drug therapy: Secondary | ICD-10-CM | POA: Diagnosis not present

## 2014-11-29 DIAGNOSIS — Z9981 Dependence on supplemental oxygen: Secondary | ICD-10-CM | POA: Diagnosis not present

## 2014-11-29 DIAGNOSIS — Z5181 Encounter for therapeutic drug level monitoring: Secondary | ICD-10-CM | POA: Diagnosis not present

## 2014-11-29 DIAGNOSIS — I5023 Acute on chronic systolic (congestive) heart failure: Secondary | ICD-10-CM | POA: Diagnosis not present

## 2014-11-29 DIAGNOSIS — E669 Obesity, unspecified: Secondary | ICD-10-CM | POA: Diagnosis not present

## 2014-11-29 DIAGNOSIS — N183 Chronic kidney disease, stage 3 (moderate): Secondary | ICD-10-CM | POA: Diagnosis not present

## 2014-11-29 DIAGNOSIS — I129 Hypertensive chronic kidney disease with stage 1 through stage 4 chronic kidney disease, or unspecified chronic kidney disease: Secondary | ICD-10-CM | POA: Diagnosis not present

## 2014-12-03 ENCOUNTER — Telehealth (HOSPITAL_COMMUNITY): Payer: Self-pay

## 2014-12-03 DIAGNOSIS — Z9981 Dependence on supplemental oxygen: Secondary | ICD-10-CM | POA: Diagnosis not present

## 2014-12-03 DIAGNOSIS — I129 Hypertensive chronic kidney disease with stage 1 through stage 4 chronic kidney disease, or unspecified chronic kidney disease: Secondary | ICD-10-CM | POA: Diagnosis not present

## 2014-12-03 DIAGNOSIS — Z79899 Other long term (current) drug therapy: Secondary | ICD-10-CM | POA: Diagnosis not present

## 2014-12-03 DIAGNOSIS — I509 Heart failure, unspecified: Secondary | ICD-10-CM | POA: Diagnosis not present

## 2014-12-03 DIAGNOSIS — R531 Weakness: Secondary | ICD-10-CM | POA: Diagnosis not present

## 2014-12-03 DIAGNOSIS — G609 Hereditary and idiopathic neuropathy, unspecified: Secondary | ICD-10-CM | POA: Diagnosis not present

## 2014-12-03 DIAGNOSIS — N183 Chronic kidney disease, stage 3 (moderate): Secondary | ICD-10-CM | POA: Diagnosis not present

## 2014-12-03 DIAGNOSIS — I5023 Acute on chronic systolic (congestive) heart failure: Secondary | ICD-10-CM | POA: Diagnosis not present

## 2014-12-03 DIAGNOSIS — E669 Obesity, unspecified: Secondary | ICD-10-CM | POA: Diagnosis not present

## 2014-12-03 DIAGNOSIS — Z452 Encounter for adjustment and management of vascular access device: Secondary | ICD-10-CM | POA: Diagnosis not present

## 2014-12-03 DIAGNOSIS — Z5181 Encounter for therapeutic drug level monitoring: Secondary | ICD-10-CM | POA: Diagnosis not present

## 2014-12-03 NOTE — Telephone Encounter (Signed)
Daughter reports patient is very tired past several days.  Wonders if it could be from changing Celexa from 10 mg to 20 mg last week Need to call daughter back

## 2014-12-04 DIAGNOSIS — N183 Chronic kidney disease, stage 3 (moderate): Secondary | ICD-10-CM | POA: Diagnosis not present

## 2014-12-04 DIAGNOSIS — Z452 Encounter for adjustment and management of vascular access device: Secondary | ICD-10-CM | POA: Diagnosis not present

## 2014-12-04 DIAGNOSIS — Z9981 Dependence on supplemental oxygen: Secondary | ICD-10-CM | POA: Diagnosis not present

## 2014-12-04 DIAGNOSIS — Z5181 Encounter for therapeutic drug level monitoring: Secondary | ICD-10-CM | POA: Diagnosis not present

## 2014-12-04 DIAGNOSIS — I129 Hypertensive chronic kidney disease with stage 1 through stage 4 chronic kidney disease, or unspecified chronic kidney disease: Secondary | ICD-10-CM | POA: Diagnosis not present

## 2014-12-04 DIAGNOSIS — G609 Hereditary and idiopathic neuropathy, unspecified: Secondary | ICD-10-CM | POA: Diagnosis not present

## 2014-12-04 DIAGNOSIS — I5023 Acute on chronic systolic (congestive) heart failure: Secondary | ICD-10-CM | POA: Diagnosis not present

## 2014-12-04 DIAGNOSIS — Z79899 Other long term (current) drug therapy: Secondary | ICD-10-CM | POA: Diagnosis not present

## 2014-12-04 DIAGNOSIS — E669 Obesity, unspecified: Secondary | ICD-10-CM | POA: Diagnosis not present

## 2014-12-05 NOTE — Telephone Encounter (Signed)
Spoke with patient's daughter, Precious Bard, who states she has become more drowsy since her citalopram was increased. This is a side effect of this medication and I recommended that she take the medication in the evening instead of in the morning to see if this helps. Daughter verbalized understanding and was grateful for the advice.   [name]. Velva Harman, PharmD, BCPS, CPP Clinical Pharmacist Pager: [phone] Phone: [phone] 12/05/2014 3:09 PM

## 2014-12-06 ENCOUNTER — Encounter: Payer: Self-pay | Admitting: Internal Medicine

## 2014-12-06 ENCOUNTER — Other Ambulatory Visit (HOSPITAL_COMMUNITY): Payer: Self-pay | Admitting: Internal Medicine

## 2014-12-06 ENCOUNTER — Ambulatory Visit (INDEPENDENT_AMBULATORY_CARE_PROVIDER_SITE_OTHER): Payer: Commercial Managed Care - HMO | Admitting: Internal Medicine

## 2014-12-06 ENCOUNTER — Encounter: Payer: Self-pay | Admitting: *Deleted

## 2014-12-06 VITALS — BP 102/60 | HR 101 | Ht 64.5 in | Wt 164.4 lb

## 2014-12-06 DIAGNOSIS — I471 Supraventricular tachycardia: Secondary | ICD-10-CM

## 2014-12-06 DIAGNOSIS — E669 Obesity, unspecified: Secondary | ICD-10-CM | POA: Diagnosis not present

## 2014-12-06 DIAGNOSIS — I129 Hypertensive chronic kidney disease with stage 1 through stage 4 chronic kidney disease, or unspecified chronic kidney disease: Secondary | ICD-10-CM | POA: Diagnosis not present

## 2014-12-06 DIAGNOSIS — Z5181 Encounter for therapeutic drug level monitoring: Secondary | ICD-10-CM | POA: Diagnosis not present

## 2014-12-06 DIAGNOSIS — Z9981 Dependence on supplemental oxygen: Secondary | ICD-10-CM | POA: Diagnosis not present

## 2014-12-06 DIAGNOSIS — R0989 Other specified symptoms and signs involving the circulatory and respiratory systems: Secondary | ICD-10-CM

## 2014-12-06 DIAGNOSIS — I429 Cardiomyopathy, unspecified: Secondary | ICD-10-CM | POA: Diagnosis not present

## 2014-12-06 DIAGNOSIS — Z01812 Encounter for preprocedural laboratory examination: Secondary | ICD-10-CM

## 2014-12-06 DIAGNOSIS — I447 Left bundle-branch block, unspecified: Secondary | ICD-10-CM

## 2014-12-06 DIAGNOSIS — I428 Other cardiomyopathies: Secondary | ICD-10-CM

## 2014-12-06 DIAGNOSIS — R0689 Other abnormalities of breathing: Secondary | ICD-10-CM

## 2014-12-06 DIAGNOSIS — G609 Hereditary and idiopathic neuropathy, unspecified: Secondary | ICD-10-CM | POA: Diagnosis not present

## 2014-12-06 DIAGNOSIS — N183 Chronic kidney disease, stage 3 (moderate): Secondary | ICD-10-CM | POA: Diagnosis not present

## 2014-12-06 DIAGNOSIS — Z452 Encounter for adjustment and management of vascular access device: Secondary | ICD-10-CM | POA: Diagnosis not present

## 2014-12-06 DIAGNOSIS — I5023 Acute on chronic systolic (congestive) heart failure: Secondary | ICD-10-CM | POA: Diagnosis not present

## 2014-12-06 DIAGNOSIS — Z79899 Other long term (current) drug therapy: Secondary | ICD-10-CM | POA: Diagnosis not present

## 2014-12-06 NOTE — Patient Instructions (Addendum)
Medication Instructions: - no changes  Labwork: - see pre-procedure instruction sheet  Procedures/Testing: - Your physician has recommended that you have a CRT/ Bi-V pacemaker inserted. A pacemaker is a small device that is placed under the skin of your chest or abdomen to help control abnormal heart rhythms. This device uses electrical pulses to prompt the heart to beat at a normal rate. Pacemakers are used to treat heart rhythms that are too slow. Wire (leads) are attached to the pacemaker that goes into the chambers of you heart. This is done in the hospital and usually requires and overnight stay. Please see the instruction sheet given to you today for more information.  - A chest x-ray takes a picture of the organs and structures inside the chest, including the heart, lungs, and blood vessels. This test can show several things, including, whether the heart is enlarges; whether fluid is building up in the lungs; and whether pacemaker / defibrillator leads are still in place.- [facility] @ [address]- [phone].  Follow-Up: - Your physician recommends that you schedule a follow-up appointment in: 10 days (from 12/25/14) for a wound check with the [facility].  Any Additional Special Instructions Will Be Listed Below (If Applicable). - none

## 2014-12-06 NOTE — Progress Notes (Signed)
ELECTROPHYSIOLOGY CONSULT NOTE  Patient ID: Alexandra [name], MRN: [id number], DOB/AGE: August 17, 1927 81 y.o. Admit date: (Not on file) Date of Consult: 12/06/2014  Primary Physician:  Melinda Crutch, MD Primary Cardiologist: DB  Chief Complaint: CRT-P   HPI Alexandra [name] is a 81 y.o. female  Referred for consideration of CRT P.   She has a history of nonischemic cardiomyopathy and left bundle branch block. It was initially thought to be related to [place] when she presented 7/16 EF at that time was 20%. She was admitted 9/16 with low output and was treated with milrinone down titration of which was required because of tachycardia. 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  Monitor strips were reviewed;  Some tachy was noted incl VT NS  Milrinone has been continued at very low doses. She remains functionally very impaired able to walk less than 50 feet. She is able to sleep mostly flat. She has scant edema.  She had an episode of syncope a few months ago. She has had no interval palpitations.  There've been variety of thoughts as to whether she should undergo catheterization to exclude coronary artery disease.  Notes were reviewed including the family's desire for other pieces.  The patient comes today with her daughter from [place].        Past Medical History  Diagnosis Date  . Arthritis   . Headache(784.0)   . Anxiety   . Depression   . Asthma   . CAP (community acquired pneumonia) 12/19/2012  . Intermittent LBBB (left bundle branch block) 12/21/2012  . Obesity   . Peripheral neuropathy ([place])   . Sacroiliitis, not elsewhere classified ([place])   . Osteopenia   . Hypercholesteremia   . Neuropathy ([place])   . Trigger finger     [name]  . Chest pain     NM stress test 05/2011 Normal  . DOE (dyspnea on exertion)     No SOB at rest  . Fatigue     excertional    . breast ca dx'd 2000    left  . Essential (primary) hypertension 04/02/2014  . Adult hypothyroidism 04/02/2014  . Hereditary and idiopathic peripheral neuropathy 07/02/2014  . Leg weakness   . CKD (chronic kidney disease) stage 3, GFR 30-59 ml/min 08/13/2014  . Anemia of chronic disease 08/13/2014  . CHF (congestive heart failure) ([place]) 08/19/2014  . Situational depression       Surgical History:  Past Surgical History  Procedure Laterality Date  . Leg surgery    . Breast lumpectomy      breast cancer  . Lumbar laminectomy    . Achilles tendon surgery    . Tonsillectomy    . Vesicovaginal fistula closure w/ tah      DC hysterectomy  . Polypectomy    . Cholecystectomy N/A 08/13/2014    Procedure: LAPAROSCOPIC CHOLECYSTECTOMY;  Surgeon: Stark Klein, MD;  Location: [facility] ORS;  Service: General;  Laterality: N/A;     Home Meds: Prior to Admission medications   Medication Sig Start Date End Date Taking? Authorizing Provider  acetaminophen (TYLENOL) 500 MG tablet Take 500 mg by mouth every 6 (six) hours as needed for mild pain.   Yes Historical Provider, MD  ALPRAZolam (XANAX) 0.25 MG tablet Take 0.25 mg by mouth 3 (three) times daily as needed for anxiety.   Yes Historical Provider, MD  b complex vitamins tablet Take  1 tablet by mouth every evening.    Yes Historical Provider, MD  Calcium Carbonate (CALTRATE 600 PO) Take 600 mg by mouth 2 (two) times daily.   Yes Historical Provider, MD  calcium carbonate (TUMS - DOSED IN MG ELEMENTAL CALCIUM) 500 MG chewable tablet Chew 2 tablets by mouth daily as needed for indigestion or heartburn.   Yes Historical Provider, MD  cholecalciferol (VITAMIN D) 1000 UNITS tablet Take 1,000 Units by mouth daily.   Yes Historical Provider, MD  citalopram (CELEXA) 20 MG tablet Take 1 tablet (20 mg total) by mouth daily. 11/27/14  Yes Jolaine Artist, MD  digoxin 62.5 MCG TABS Take 0.0625 mg by mouth daily. 10/15/14  Yes Shirley Friar, PA-C  furosemide  (LASIX) 40 MG tablet Take 1 tablet (40 mg total) by mouth daily. 10/15/14  Yes Shirley Friar, PA-C  lactose free nutrition (BOOST) LIQD Take 237 mLs by mouth daily with breakfast.    Yes Historical Provider, MD  levothyroxine (SYNTHROID, LEVOTHROID) 25 MCG tablet Take one tablet by mouth once daily in the morning before breakfast for thyroid   Yes Historical Provider, MD  milrinone (PRIMACOR) 20 MG/100ML SOLN infusion Inject 9.875 mcg/min into the vein continuous. 10/15/14  Yes Shirley Friar, PA-C  Multiple Vitamin (MULITIVITAMIN WITH MINERALS) TABS Take 1 tablet by mouth daily.   Yes Historical Provider, MD  nitroGLYCERIN (NITROSTAT) 0.4 MG SL tablet Place 1 tablet (0.4 mg total) under the tongue every 5 (five) minutes as needed for chest pain. 11/22/14  Yes Jolaine Artist, MD  OXYGEN Inhale 2 L into the lungs daily.   Yes Historical Provider, MD  pantoprazole (PROTONIX) 40 MG tablet Take 40 mg by mouth 2 (two) times daily.   Yes Historical Provider, MD  polyethylene glycol (MIRALAX / GLYCOLAX) packet Take 17 g by mouth daily as needed for mild constipation.   Yes Historical Provider, MD  potassium chloride (K-DUR) 10 MEQ tablet Take 1 tablet (10 mEq total) by mouth daily. 10/15/14  Yes Shirley Friar, PA-C  promethazine (PHENERGAN) 25 MG tablet Take one tablet by mouth every 8 hours as needed for nausea   Yes Historical Provider, MD  sodium chloride (OCEAN) 0.65 % SOLN nasal spray Place 1 spray into both nostrils as needed for congestion.   Yes Historical Provider, MD  spironolactone (ALDACTONE) 25 MG tablet Take 1 tablet (25 mg total) by mouth daily. 10/15/14  Yes Shirley Friar, PA-C  traMADol (ULTRAM) 50 MG tablet Take 1 tablet (50 mg total) by mouth every 6 (six) hours as needed for moderate pain. 08/23/14  Yes Charlynne Cousins, MD  VENTOLIN HFA 108 (90 BASE) MCG/ACT inhaler INhaLe 2 PufFS by mouth every 4 hours as needed for shortness of breath or wheezing  07/19/14  Yes Historical Provider, MD  XARELTO 20 MG TABS tablet TAKE 1 TABLET BY MOUTH EVERY DAY 11/13/14  Yes Jolaine Artist, MD    Allergies:  Allergies  Allergen Reactions  . Amiodarone Shortness Of Breath and Nausea And Vomiting  . Cymbalta [Duloxetine Hcl] Other (See Comments)    Depression  . Lyrica [Pregabalin] Other (See Comments)    Blurry vision    Social History   Social History  . Marital Status: Married    Spouse Name: N/A  . Number of Children: 5  . Years of Education: HS   Occupational History  . Retired    Social History Main Topics  . Smoking status: Never Smoker   .  Smokeless tobacco: Never Used  . Alcohol Use: No  . Drug Use: No  . Sexual Activity: Not on file   Other Topics Concern  . Not on file   Social History Narrative   Lives at home alone.   Right-handed.   Two cups caffeine daily (coffee).     Family History  Problem Relation Age of Onset  . Diabetes Brother   . Alzheimer's disease Sister   . CVA Daughter     01/21/09  . Pancreatic cancer Father 35  . Pneumonia Mother 58    cause of death  . Heart attack Neg Hx   . Stroke Daughter   . Hypertension Daughter      ROS:  Please see the history of present illness.     All other systems reviewed and negative.    Physical Exam: Pulse 101, height 5' 4.5" (1.638 m), weight 164 lb 6.4 oz (74.571 kg). General: Well developed, well nourished female in no acute distress. Head: Normocephalic, atraumatic, sclera non-icteric, no xanthomas, nares are without discharge. EENT: normal  Lymph Nodes:  none Neck: Negative for carotid bruits. JVD not elevated. Back:without scoliosis kyphosis Lungs: Clear bilaterally to auscultation without wheezes, rales, or rhonchi. Breathing is unlabored. Heart: RRR with S1 S2. 2/6 systolic murmur . No rubs, or gallops appreciated. Abdomen: Soft, non-tender, non-distended with normoactive bowel sounds. No hepatomegaly. No rebound/guarding. No obvious abdominal  masses. Msk:  Strength and tone appear normal for age. Extremities: No clubbing or cyanosis.  tr edema.  Distal pedal pulses are 2+ and equal bilaterally. Skin: Warm and Dry Neuro: Alert and oriented X 3. CN III-XII intact Grossly normal sensory and motor function . Psych:  Responds to questions appropriately with a normal affect.      Labs: Cardiac Enzymes No results for input(s): CKTOTAL, CKMB, TROPONINI in the last 72 hours. CBC Lab Results  Component Value Date   WBC 7.1 10/22/2014   HGB 11.5* 10/22/2014   HCT 36 10/22/2014   MCV 88.7 10/11/2014   PLT 274 10/22/2014   PROTIME: No results for input(s): LABPROT, INR in the last 72 hours. Chemistry No results for input(s): NA, K, CL, CO2, BUN, CREATININE, CALCIUM, PROT, BILITOT, ALKPHOS, ALT, AST, GLUCOSE in the last 168 hours.  Invalid input(s): LABALBU Lipids No results found for: CHOL, HDL, LDLCALC, TRIG BNP PRO B NATRIURETIC PEPTIDE (BNP)  Date/Time Value Ref Range Status  09/25/2014 01:32 PM 2219.0* 0.0 - 100.0 pg/mL Final  12/18/2012 11:00 PM 173.5 0 - 450 pg/mL Final   Thyroid Function Tests: No results for input(s): TSH, T4TOTAL, T3FREE, THYROIDAB in the last 72 hours.  Invalid input(s): FREET3 Miscellaneous Lab Results  Component Value Date   DDIMER 8.14* 08/19/2014    Radiology/Studies:  No results found.  EKG:  11/27/14 demonstrated sinus rhythm at 114 Intervals 17/15/44   Assessment and Plan:  Presumed nonischemic cardiomyopathy  CHF chronic systolic  LBBB  Sinus tachycardia   The patient has persistent left ventricular dysfunction with left bundle branch block and without discrete wall motion abnormalities. It is presumed nonischemic although when she presented there was a significant associated arm pain/right shoulder pain which was ultimately attributed to her gallbladder (I guess)  She's now on milrinone. Higher doses were associated with tachycardia I have raised the possibility of  putting her on either Ivabradine. There are papers from the [place] suggesting that this might be of some value. I will defer this decision to Dr. Reine Just. I also wonder  whether it is prudent to exclude coronary artery disease as it would have a major impact on strategy; however, I appreciate that her cardiomyopathy is global and a left bundle branch block is long-standing.  It is reasonable not withstanding the QRS duration of only 146 to pursue CRT P in this lady; there is some potential mortality reduction based on CARE HF in addition to the hopes that there will be in functional improvement. We have discussed risks and benefits including but not limited to perforation of heart and her lung, diaphragmatic stimulation and infection.  In anticipation of this, with her abnormal lung exam, we will undertake a chest x-ray.  She would like to defer this until after Thanksgiving. We will schedule at the first possible time. She is aware of the potential for mortality with her severe cardiomyopathy.      Virl Axe

## 2014-12-09 DIAGNOSIS — Z79899 Other long term (current) drug therapy: Secondary | ICD-10-CM | POA: Diagnosis not present

## 2014-12-09 DIAGNOSIS — Z452 Encounter for adjustment and management of vascular access device: Secondary | ICD-10-CM | POA: Diagnosis not present

## 2014-12-09 DIAGNOSIS — G609 Hereditary and idiopathic neuropathy, unspecified: Secondary | ICD-10-CM | POA: Diagnosis not present

## 2014-12-09 DIAGNOSIS — I129 Hypertensive chronic kidney disease with stage 1 through stage 4 chronic kidney disease, or unspecified chronic kidney disease: Secondary | ICD-10-CM | POA: Diagnosis not present

## 2014-12-09 DIAGNOSIS — I5023 Acute on chronic systolic (congestive) heart failure: Secondary | ICD-10-CM | POA: Diagnosis not present

## 2014-12-09 DIAGNOSIS — Z9981 Dependence on supplemental oxygen: Secondary | ICD-10-CM | POA: Diagnosis not present

## 2014-12-09 DIAGNOSIS — N183 Chronic kidney disease, stage 3 (moderate): Secondary | ICD-10-CM | POA: Diagnosis not present

## 2014-12-09 DIAGNOSIS — Z5181 Encounter for therapeutic drug level monitoring: Secondary | ICD-10-CM | POA: Diagnosis not present

## 2014-12-09 DIAGNOSIS — E669 Obesity, unspecified: Secondary | ICD-10-CM | POA: Diagnosis not present

## 2014-12-10 DIAGNOSIS — N183 Chronic kidney disease, stage 3 (moderate): Secondary | ICD-10-CM | POA: Diagnosis not present

## 2014-12-10 DIAGNOSIS — Z9981 Dependence on supplemental oxygen: Secondary | ICD-10-CM | POA: Diagnosis not present

## 2014-12-10 DIAGNOSIS — Z5181 Encounter for therapeutic drug level monitoring: Secondary | ICD-10-CM | POA: Diagnosis not present

## 2014-12-10 DIAGNOSIS — G609 Hereditary and idiopathic neuropathy, unspecified: Secondary | ICD-10-CM | POA: Diagnosis not present

## 2014-12-10 DIAGNOSIS — R531 Weakness: Secondary | ICD-10-CM | POA: Diagnosis not present

## 2014-12-10 DIAGNOSIS — Z452 Encounter for adjustment and management of vascular access device: Secondary | ICD-10-CM | POA: Diagnosis not present

## 2014-12-10 DIAGNOSIS — E669 Obesity, unspecified: Secondary | ICD-10-CM | POA: Diagnosis not present

## 2014-12-10 DIAGNOSIS — I129 Hypertensive chronic kidney disease with stage 1 through stage 4 chronic kidney disease, or unspecified chronic kidney disease: Secondary | ICD-10-CM | POA: Diagnosis not present

## 2014-12-10 DIAGNOSIS — Z79899 Other long term (current) drug therapy: Secondary | ICD-10-CM | POA: Diagnosis not present

## 2014-12-10 DIAGNOSIS — I509 Heart failure, unspecified: Secondary | ICD-10-CM | POA: Diagnosis not present

## 2014-12-10 DIAGNOSIS — I5023 Acute on chronic systolic (congestive) heart failure: Secondary | ICD-10-CM | POA: Diagnosis not present

## 2014-12-13 DIAGNOSIS — E669 Obesity, unspecified: Secondary | ICD-10-CM | POA: Diagnosis not present

## 2014-12-13 DIAGNOSIS — Z452 Encounter for adjustment and management of vascular access device: Secondary | ICD-10-CM | POA: Diagnosis not present

## 2014-12-13 DIAGNOSIS — I129 Hypertensive chronic kidney disease with stage 1 through stage 4 chronic kidney disease, or unspecified chronic kidney disease: Secondary | ICD-10-CM | POA: Diagnosis not present

## 2014-12-13 DIAGNOSIS — N183 Chronic kidney disease, stage 3 (moderate): Secondary | ICD-10-CM | POA: Diagnosis not present

## 2014-12-13 DIAGNOSIS — I5023 Acute on chronic systolic (congestive) heart failure: Secondary | ICD-10-CM | POA: Diagnosis not present

## 2014-12-13 DIAGNOSIS — Z9981 Dependence on supplemental oxygen: Secondary | ICD-10-CM | POA: Diagnosis not present

## 2014-12-13 DIAGNOSIS — Z5181 Encounter for therapeutic drug level monitoring: Secondary | ICD-10-CM | POA: Diagnosis not present

## 2014-12-13 DIAGNOSIS — Z79899 Other long term (current) drug therapy: Secondary | ICD-10-CM | POA: Diagnosis not present

## 2014-12-13 DIAGNOSIS — G609 Hereditary and idiopathic neuropathy, unspecified: Secondary | ICD-10-CM | POA: Diagnosis not present

## 2014-12-14 DIAGNOSIS — Z79899 Other long term (current) drug therapy: Secondary | ICD-10-CM | POA: Diagnosis not present

## 2014-12-14 DIAGNOSIS — N183 Chronic kidney disease, stage 3 (moderate): Secondary | ICD-10-CM | POA: Diagnosis not present

## 2014-12-14 DIAGNOSIS — E669 Obesity, unspecified: Secondary | ICD-10-CM | POA: Diagnosis not present

## 2014-12-14 DIAGNOSIS — G609 Hereditary and idiopathic neuropathy, unspecified: Secondary | ICD-10-CM | POA: Diagnosis not present

## 2014-12-14 DIAGNOSIS — Z5181 Encounter for therapeutic drug level monitoring: Secondary | ICD-10-CM | POA: Diagnosis not present

## 2014-12-14 DIAGNOSIS — Z9981 Dependence on supplemental oxygen: Secondary | ICD-10-CM | POA: Diagnosis not present

## 2014-12-14 DIAGNOSIS — I129 Hypertensive chronic kidney disease with stage 1 through stage 4 chronic kidney disease, or unspecified chronic kidney disease: Secondary | ICD-10-CM | POA: Diagnosis not present

## 2014-12-14 DIAGNOSIS — Z452 Encounter for adjustment and management of vascular access device: Secondary | ICD-10-CM | POA: Diagnosis not present

## 2014-12-14 DIAGNOSIS — I5023 Acute on chronic systolic (congestive) heart failure: Secondary | ICD-10-CM | POA: Diagnosis not present

## 2014-12-15 DIAGNOSIS — I429 Cardiomyopathy, unspecified: Secondary | ICD-10-CM | POA: Diagnosis not present

## 2014-12-15 DIAGNOSIS — I2699 Other pulmonary embolism without acute cor pulmonale: Secondary | ICD-10-CM | POA: Diagnosis not present

## 2014-12-15 DIAGNOSIS — J189 Pneumonia, unspecified organism: Secondary | ICD-10-CM | POA: Diagnosis not present

## 2014-12-15 DIAGNOSIS — I5043 Acute on chronic combined systolic (congestive) and diastolic (congestive) heart failure: Secondary | ICD-10-CM | POA: Diagnosis not present

## 2014-12-16 ENCOUNTER — Telehealth (HOSPITAL_COMMUNITY): Payer: Self-pay

## 2014-12-16 ENCOUNTER — Telehealth: Payer: Self-pay | Admitting: Physician Assistant

## 2014-12-16 ENCOUNTER — Ambulatory Visit
Admission: RE | Admit: 2014-12-16 | Discharge: 2014-12-16 | Disposition: A | Discharge status: Home / Self Care | Payer: Commercial Managed Care - HMO | Source: Ambulatory Visit | Attending: Internal Medicine | Admitting: Internal Medicine

## 2014-12-16 DIAGNOSIS — N183 Chronic kidney disease, stage 3 (moderate): Secondary | ICD-10-CM | POA: Diagnosis not present

## 2014-12-16 DIAGNOSIS — R05 Cough: Secondary | ICD-10-CM | POA: Diagnosis not present

## 2014-12-16 DIAGNOSIS — E669 Obesity, unspecified: Secondary | ICD-10-CM | POA: Diagnosis not present

## 2014-12-16 DIAGNOSIS — I129 Hypertensive chronic kidney disease with stage 1 through stage 4 chronic kidney disease, or unspecified chronic kidney disease: Secondary | ICD-10-CM | POA: Diagnosis not present

## 2014-12-16 DIAGNOSIS — Z452 Encounter for adjustment and management of vascular access device: Secondary | ICD-10-CM | POA: Diagnosis not present

## 2014-12-16 DIAGNOSIS — G609 Hereditary and idiopathic neuropathy, unspecified: Secondary | ICD-10-CM | POA: Diagnosis not present

## 2014-12-16 DIAGNOSIS — R509 Fever, unspecified: Secondary | ICD-10-CM

## 2014-12-16 DIAGNOSIS — I5023 Acute on chronic systolic (congestive) heart failure: Secondary | ICD-10-CM | POA: Diagnosis not present

## 2014-12-16 DIAGNOSIS — Z9981 Dependence on supplemental oxygen: Secondary | ICD-10-CM | POA: Diagnosis not present

## 2014-12-16 DIAGNOSIS — Z5181 Encounter for therapeutic drug level monitoring: Secondary | ICD-10-CM | POA: Diagnosis not present

## 2014-12-16 DIAGNOSIS — Z79899 Other long term (current) drug therapy: Secondary | ICD-10-CM | POA: Diagnosis not present

## 2014-12-16 DIAGNOSIS — R0689 Other abnormalities of breathing: Secondary | ICD-10-CM

## 2014-12-16 NOTE — Telephone Encounter (Signed)
Patient has 3 week F/U OV with [facility] on 12/01 and a procedure for pacemaker on 12/07  Alexandra [name] would like to know if the appointment with Dr. Haroldine Laws is really necessary on 12/01 prior to pacer insertion or should she reschedule for after PM insertion  Talking to daughter, her mother wasn't feeling good over the weekend with a temperature.  I told her I think it would be a good idea to keep the appointment. Chong Sicilian will work out a better time and day for herself to bring her in then what is currently scheduled.

## 2014-12-16 NOTE — Telephone Encounter (Signed)
Physical Therapy Assistant made home visit today Reports temp 100.3 O2 sat 90% (typically runs 96-97%  Called and gave a verbal order from Dr. Haroldine Laws to do blood cultures today from PICC line along with UA and Urine Culture

## 2014-12-16 NOTE — Telephone Encounter (Signed)
[facility] will try to get someone there this evening to get [facility] x 2 along with UA with culture. Daughter said that chest x-ray has been completed

## 2014-12-16 NOTE — Telephone Encounter (Signed)
Is running 100 temp today.  Blood cultures added to lab at [facility] for Wednesday 11/30 Appointment with Dr. Haroldine Laws appointment for 12/01 cancelled and rescheduled for 12/27,  a week after Dr. Caryl Comes FU OV for pacer

## 2014-12-16 NOTE — Telephone Encounter (Signed)
Whatever works best for her. I don't have much else to offer at this point. Pacer is next step. If she is still febrile today needs blood cultures x 2.

## 2014-12-16 NOTE — Telephone Encounter (Signed)
Needs blood cultures today including one from the PICC. Also need UA and culture.

## 2014-12-16 NOTE — Telephone Encounter (Signed)
Paged from [place], Ronalee Belts, requesting to defers to do blood culture until tomorrow as they do not have supplies today. No plan to start abx.   Ok to draw blood culture tomorrow, start abx afterwards if needed.   Martrell Eguia, [place]

## 2014-12-17 ENCOUNTER — Other Ambulatory Visit (HOSPITAL_COMMUNITY): Payer: Self-pay | Admitting: Internal Medicine

## 2014-12-17 ENCOUNTER — Other Ambulatory Visit: Payer: Self-pay | Admitting: *Deleted

## 2014-12-17 ENCOUNTER — Telehealth: Payer: Self-pay | Admitting: Internal Medicine

## 2014-12-17 DIAGNOSIS — Z79899 Other long term (current) drug therapy: Secondary | ICD-10-CM | POA: Diagnosis not present

## 2014-12-17 DIAGNOSIS — I5043 Acute on chronic combined systolic (congestive) and diastolic (congestive) heart failure: Secondary | ICD-10-CM | POA: Diagnosis not present

## 2014-12-17 DIAGNOSIS — I429 Cardiomyopathy, unspecified: Secondary | ICD-10-CM | POA: Diagnosis not present

## 2014-12-17 DIAGNOSIS — N183 Chronic kidney disease, stage 3 (moderate): Secondary | ICD-10-CM | POA: Diagnosis not present

## 2014-12-17 DIAGNOSIS — I2699 Other pulmonary embolism without acute cor pulmonale: Secondary | ICD-10-CM | POA: Diagnosis not present

## 2014-12-17 DIAGNOSIS — E669 Obesity, unspecified: Secondary | ICD-10-CM | POA: Diagnosis not present

## 2014-12-17 DIAGNOSIS — G609 Hereditary and idiopathic neuropathy, unspecified: Secondary | ICD-10-CM | POA: Diagnosis not present

## 2014-12-17 DIAGNOSIS — I129 Hypertensive chronic kidney disease with stage 1 through stage 4 chronic kidney disease, or unspecified chronic kidney disease: Secondary | ICD-10-CM | POA: Diagnosis not present

## 2014-12-17 DIAGNOSIS — R531 Weakness: Secondary | ICD-10-CM | POA: Diagnosis not present

## 2014-12-17 DIAGNOSIS — Z9981 Dependence on supplemental oxygen: Secondary | ICD-10-CM | POA: Diagnosis not present

## 2014-12-17 DIAGNOSIS — I509 Heart failure, unspecified: Secondary | ICD-10-CM | POA: Diagnosis not present

## 2014-12-17 DIAGNOSIS — J189 Pneumonia, unspecified organism: Secondary | ICD-10-CM | POA: Diagnosis not present

## 2014-12-17 DIAGNOSIS — Z5181 Encounter for therapeutic drug level monitoring: Secondary | ICD-10-CM | POA: Diagnosis not present

## 2014-12-17 DIAGNOSIS — I5023 Acute on chronic systolic (congestive) heart failure: Secondary | ICD-10-CM | POA: Diagnosis not present

## 2014-12-17 DIAGNOSIS — Z452 Encounter for adjustment and management of vascular access device: Secondary | ICD-10-CM | POA: Diagnosis not present

## 2014-12-17 NOTE — Telephone Encounter (Signed)
[facility] drew routine weekly lab today CBC and BMET. Kevan Rosebush RN will send results of today's lab to Dr. Aquilla Hacker office when we receive  BMP and CBC results later today If [facility] doesn't draw blood cultures as ordered by Dr. Haroldine Laws by tomorrow morning patient will come to [facility] to have them drawn. Urine and Urine Culture completed by [place]. Chest xray results in chart.  Daughter said patient is very tired and still running a 100.3 temp  Will route to [employer], [employer] and Dr. Haroldine Laws

## 2014-12-17 NOTE — Telephone Encounter (Addendum)
Called Alexandra [name] back and received verbal permission from patient to speak with her. Patient is suppose to have pre-cath labs. Patient has had some complications in getting lab work done, but [employer] informed our office that labs would be done at Dr. Haroldine Laws office tomorrow. [name] wanted our office to call [facility] from Dr. Clayborne Dana office and make sure all labs would be done tomorrow. Will forward to [facility] to make sure she is aware, message was left via phone call also.

## 2014-12-17 NOTE — Patient Outreach (Signed)
Initial outreach for HIGH RISK PATIENT and referred by Dr. Harrington Challenger. There was no answer but I left a message and my number for requested call back.  [name] [facility] [place] [phone]

## 2014-12-17 NOTE — Telephone Encounter (Signed)
Pt's dtr Alexandra [name] calling to see if the blood work pt had done yesterday will suffice for her procedure with Caryl Comes on 12-25-14, pls advise

## 2014-12-18 ENCOUNTER — Other Ambulatory Visit (HOSPITAL_COMMUNITY): Payer: Self-pay | Admitting: Internal Medicine

## 2014-12-18 ENCOUNTER — Other Ambulatory Visit: Payer: Commercial Managed Care - HMO

## 2014-12-18 ENCOUNTER — Ambulatory Visit (HOSPITAL_BASED_OUTPATIENT_CLINIC_OR_DEPARTMENT_OTHER)
Admission: RE | Admit: 2014-12-18 | Discharge: 2014-12-18 | Disposition: A | Discharge status: Home / Self Care | Payer: Commercial Managed Care - HMO | Source: Ambulatory Visit | Attending: Cardiology | Admitting: Cardiology

## 2014-12-18 DIAGNOSIS — Z9049 Acquired absence of other specified parts of digestive tract: Secondary | ICD-10-CM | POA: Diagnosis not present

## 2014-12-18 DIAGNOSIS — N289 Disorder of kidney and ureter, unspecified: Secondary | ICD-10-CM | POA: Diagnosis not present

## 2014-12-18 DIAGNOSIS — T80211A Bloodstream infection due to central venous catheter, initial encounter: Secondary | ICD-10-CM | POA: Diagnosis present

## 2014-12-18 DIAGNOSIS — F329 Major depressive disorder, single episode, unspecified: Secondary | ICD-10-CM | POA: Diagnosis present

## 2014-12-18 DIAGNOSIS — I447 Left bundle-branch block, unspecified: Secondary | ICD-10-CM | POA: Diagnosis present

## 2014-12-18 DIAGNOSIS — R7881 Bacteremia: Secondary | ICD-10-CM | POA: Diagnosis present

## 2014-12-18 DIAGNOSIS — I130 Hypertensive heart and chronic kidney disease with heart failure and stage 1 through stage 4 chronic kidney disease, or unspecified chronic kidney disease: Secondary | ICD-10-CM | POA: Diagnosis present

## 2014-12-18 DIAGNOSIS — B9689 Other specified bacterial agents as the cause of diseases classified elsewhere: Secondary | ICD-10-CM | POA: Diagnosis present

## 2014-12-18 DIAGNOSIS — N390 Urinary tract infection, site not specified: Secondary | ICD-10-CM | POA: Diagnosis present

## 2014-12-18 DIAGNOSIS — N183 Chronic kidney disease, stage 3 (moderate): Secondary | ICD-10-CM | POA: Diagnosis present

## 2014-12-18 DIAGNOSIS — K529 Noninfective gastroenteritis and colitis, unspecified: Secondary | ICD-10-CM | POA: Diagnosis present

## 2014-12-18 DIAGNOSIS — J189 Pneumonia, unspecified organism: Secondary | ICD-10-CM | POA: Diagnosis not present

## 2014-12-18 DIAGNOSIS — Z9981 Dependence on supplemental oxygen: Secondary | ICD-10-CM | POA: Diagnosis not present

## 2014-12-18 DIAGNOSIS — R509 Fever, unspecified: Secondary | ICD-10-CM | POA: Diagnosis present

## 2014-12-18 DIAGNOSIS — I129 Hypertensive chronic kidney disease with stage 1 through stage 4 chronic kidney disease, or unspecified chronic kidney disease: Secondary | ICD-10-CM | POA: Diagnosis not present

## 2014-12-18 DIAGNOSIS — F419 Anxiety disorder, unspecified: Secondary | ICD-10-CM | POA: Diagnosis present

## 2014-12-18 DIAGNOSIS — Z79899 Other long term (current) drug therapy: Secondary | ICD-10-CM | POA: Diagnosis not present

## 2014-12-18 DIAGNOSIS — G609 Hereditary and idiopathic neuropathy, unspecified: Secondary | ICD-10-CM | POA: Diagnosis not present

## 2014-12-18 DIAGNOSIS — D638 Anemia in other chronic diseases classified elsewhere: Secondary | ICD-10-CM | POA: Diagnosis present

## 2014-12-18 DIAGNOSIS — I10 Essential (primary) hypertension: Secondary | ICD-10-CM | POA: Diagnosis not present

## 2014-12-18 DIAGNOSIS — Y848 Other medical procedures as the cause of abnormal reaction of the patient, or of later complication, without mention of misadventure at the time of the procedure: Secondary | ICD-10-CM | POA: Diagnosis present

## 2014-12-18 DIAGNOSIS — I2699 Other pulmonary embolism without acute cor pulmonale: Secondary | ICD-10-CM | POA: Diagnosis not present

## 2014-12-18 DIAGNOSIS — Z452 Encounter for adjustment and management of vascular access device: Secondary | ICD-10-CM | POA: Diagnosis not present

## 2014-12-18 DIAGNOSIS — I5043 Acute on chronic combined systolic (congestive) and diastolic (congestive) heart failure: Secondary | ICD-10-CM | POA: Diagnosis not present

## 2014-12-18 DIAGNOSIS — Z86711 Personal history of pulmonary embolism: Secondary | ICD-10-CM | POA: Diagnosis not present

## 2014-12-18 DIAGNOSIS — Z86718 Personal history of other venous thrombosis and embolism: Secondary | ICD-10-CM | POA: Diagnosis not present

## 2014-12-18 DIAGNOSIS — I5022 Chronic systolic (congestive) heart failure: Secondary | ICD-10-CM

## 2014-12-18 DIAGNOSIS — T80219A Unspecified infection due to central venous catheter, initial encounter: Secondary | ICD-10-CM | POA: Diagnosis not present

## 2014-12-18 DIAGNOSIS — Z5181 Encounter for therapeutic drug level monitoring: Secondary | ICD-10-CM | POA: Diagnosis not present

## 2014-12-18 DIAGNOSIS — Z7901 Long term (current) use of anticoagulants: Secondary | ICD-10-CM | POA: Diagnosis not present

## 2014-12-18 DIAGNOSIS — E43 Unspecified severe protein-calorie malnutrition: Secondary | ICD-10-CM | POA: Diagnosis present

## 2014-12-18 DIAGNOSIS — E039 Hypothyroidism, unspecified: Secondary | ICD-10-CM | POA: Diagnosis present

## 2014-12-18 DIAGNOSIS — E669 Obesity, unspecified: Secondary | ICD-10-CM | POA: Diagnosis not present

## 2014-12-18 DIAGNOSIS — E876 Hypokalemia: Secondary | ICD-10-CM | POA: Diagnosis present

## 2014-12-18 DIAGNOSIS — I429 Cardiomyopathy, unspecified: Secondary | ICD-10-CM | POA: Diagnosis present

## 2014-12-18 DIAGNOSIS — Z888 Allergy status to other drugs, medicaments and biological substances status: Secondary | ICD-10-CM | POA: Diagnosis not present

## 2014-12-18 DIAGNOSIS — Z853 Personal history of malignant neoplasm of breast: Secondary | ICD-10-CM | POA: Diagnosis not present

## 2014-12-18 DIAGNOSIS — I5023 Acute on chronic systolic (congestive) heart failure: Secondary | ICD-10-CM | POA: Diagnosis not present

## 2014-12-18 LAB — BASIC METABOLIC PANEL
Anion gap: 11 (ref 5–15)
BUN: 15 mg/dL (ref 6–20)
CHLORIDE: 97 mmol/L — AB (ref 101–111)
CO2: 27 mmol/L (ref 22–32)
CREATININE: 1.2 mg/dL — AB (ref 0.44–1.00)
Calcium: 9.1 mg/dL (ref 8.9–10.3)
GFR, EST AFRICAN AMERICAN: 46 mL/min — AB (ref >60)
GFR, EST NON AFRICAN AMERICAN: 40 mL/min — AB (ref >60)
Glucose, Bld: 154 mg/dL — ABNORMAL HIGH (ref 65–99)
POTASSIUM: 3.8 mmol/L (ref 3.5–5.1)
SODIUM: 135 mmol/L (ref 135–145)

## 2014-12-18 LAB — CBC
HCT: 37.2 % (ref 36.0–46.0)
Hemoglobin: 12.1 g/dL (ref 12.0–15.0)
MCH: 29.7 pg (ref 26.0–34.0)
MCHC: 32.5 g/dL (ref 30.0–36.0)
MCV: 91.2 fL (ref 78.0–100.0)
Platelets: 202 K/uL (ref 150–400)
RBC: 4.08 MIL/uL (ref 3.87–5.11)
RDW: 16.8 % — ABNORMAL HIGH (ref 11.5–15.5)
WBC: 6 K/uL (ref 4.0–10.5)

## 2014-12-18 LAB — PROTIME-INR
INR: 2.54 — AB (ref 0.00–1.49)
Prothrombin Time: 27 seconds — ABNORMAL HIGH (ref 11.6–15.2)

## 2014-12-18 NOTE — Telephone Encounter (Signed)
Answered phone call. Person on the other end exclaimed, "I will have to call you back." And hung up.

## 2014-12-18 NOTE — Telephone Encounter (Signed)
Pt's dtr calling re labs at Dr. Clayborne Dana office and it is not going well, wanted to speak to a nurse asap, Dr. Olin Pia nurse not in, sent to traige-spoke with Dorris Fetch take call

## 2014-12-19 ENCOUNTER — Inpatient Hospital Stay (HOSPITAL_COMMUNITY)
Admission: EM | Admit: 2014-12-19 | Discharge: 2014-12-24 | DRG: 314 | Disposition: A | Discharge status: Home Health | Payer: Commercial Managed Care - HMO | Source: Ambulatory Visit | Attending: Internal Medicine | Admitting: Internal Medicine

## 2014-12-19 ENCOUNTER — Telehealth (HOSPITAL_COMMUNITY): Payer: Self-pay

## 2014-12-19 ENCOUNTER — Telehealth (HOSPITAL_COMMUNITY): Payer: Self-pay | Admitting: *Deleted

## 2014-12-19 ENCOUNTER — Emergency Department (HOSPITAL_COMMUNITY): Payer: Commercial Managed Care - HMO

## 2014-12-19 ENCOUNTER — Other Ambulatory Visit: Payer: Self-pay | Admitting: *Deleted

## 2014-12-19 ENCOUNTER — Encounter (HOSPITAL_COMMUNITY): Payer: Commercial Managed Care - HMO | Admitting: Internal Medicine

## 2014-12-19 ENCOUNTER — Encounter (HOSPITAL_COMMUNITY): Payer: Self-pay

## 2014-12-19 DIAGNOSIS — N183 Chronic kidney disease, stage 3 unspecified: Secondary | ICD-10-CM | POA: Diagnosis present

## 2014-12-19 DIAGNOSIS — Z86711 Personal history of pulmonary embolism: Secondary | ICD-10-CM | POA: Diagnosis not present

## 2014-12-19 DIAGNOSIS — I429 Cardiomyopathy, unspecified: Secondary | ICD-10-CM | POA: Diagnosis present

## 2014-12-19 DIAGNOSIS — N390 Urinary tract infection, site not specified: Secondary | ICD-10-CM | POA: Diagnosis present

## 2014-12-19 DIAGNOSIS — Z7901 Long term (current) use of anticoagulants: Secondary | ICD-10-CM

## 2014-12-19 DIAGNOSIS — I10 Essential (primary) hypertension: Secondary | ICD-10-CM | POA: Diagnosis not present

## 2014-12-19 DIAGNOSIS — I5023 Acute on chronic systolic (congestive) heart failure: Secondary | ICD-10-CM | POA: Diagnosis not present

## 2014-12-19 DIAGNOSIS — T80219A Unspecified infection due to central venous catheter, initial encounter: Secondary | ICD-10-CM | POA: Diagnosis not present

## 2014-12-19 DIAGNOSIS — E669 Obesity, unspecified: Secondary | ICD-10-CM | POA: Diagnosis not present

## 2014-12-19 DIAGNOSIS — Z86718 Personal history of other venous thrombosis and embolism: Secondary | ICD-10-CM

## 2014-12-19 DIAGNOSIS — E876 Hypokalemia: Secondary | ICD-10-CM | POA: Diagnosis present

## 2014-12-19 DIAGNOSIS — Z888 Allergy status to other drugs, medicaments and biological substances status: Secondary | ICD-10-CM

## 2014-12-19 DIAGNOSIS — K529 Noninfective gastroenteritis and colitis, unspecified: Secondary | ICD-10-CM | POA: Diagnosis present

## 2014-12-19 DIAGNOSIS — F329 Major depressive disorder, single episode, unspecified: Secondary | ICD-10-CM | POA: Diagnosis present

## 2014-12-19 DIAGNOSIS — Z9981 Dependence on supplemental oxygen: Secondary | ICD-10-CM | POA: Diagnosis not present

## 2014-12-19 DIAGNOSIS — R7881 Bacteremia: Secondary | ICD-10-CM

## 2014-12-19 DIAGNOSIS — I5022 Chronic systolic (congestive) heart failure: Secondary | ICD-10-CM | POA: Diagnosis present

## 2014-12-19 DIAGNOSIS — Z9049 Acquired absence of other specified parts of digestive tract: Secondary | ICD-10-CM | POA: Diagnosis not present

## 2014-12-19 DIAGNOSIS — B9689 Other specified bacterial agents as the cause of diseases classified elsewhere: Secondary | ICD-10-CM | POA: Diagnosis present

## 2014-12-19 DIAGNOSIS — E43 Unspecified severe protein-calorie malnutrition: Secondary | ICD-10-CM | POA: Diagnosis present

## 2014-12-19 DIAGNOSIS — I447 Left bundle-branch block, unspecified: Secondary | ICD-10-CM | POA: Diagnosis present

## 2014-12-19 DIAGNOSIS — I129 Hypertensive chronic kidney disease with stage 1 through stage 4 chronic kidney disease, or unspecified chronic kidney disease: Secondary | ICD-10-CM | POA: Diagnosis not present

## 2014-12-19 DIAGNOSIS — R509 Fever, unspecified: Secondary | ICD-10-CM

## 2014-12-19 DIAGNOSIS — F419 Anxiety disorder, unspecified: Secondary | ICD-10-CM | POA: Diagnosis present

## 2014-12-19 DIAGNOSIS — I130 Hypertensive heart and chronic kidney disease with heart failure and stage 1 through stage 4 chronic kidney disease, or unspecified chronic kidney disease: Secondary | ICD-10-CM | POA: Diagnosis present

## 2014-12-19 DIAGNOSIS — Y848 Other medical procedures as the cause of abnormal reaction of the patient, or of later complication, without mention of misadventure at the time of the procedure: Secondary | ICD-10-CM | POA: Diagnosis present

## 2014-12-19 DIAGNOSIS — T80211A Bloodstream infection due to central venous catheter, initial encounter: Secondary | ICD-10-CM | POA: Diagnosis present

## 2014-12-19 DIAGNOSIS — Z853 Personal history of malignant neoplasm of breast: Secondary | ICD-10-CM

## 2014-12-19 DIAGNOSIS — G609 Hereditary and idiopathic neuropathy, unspecified: Secondary | ICD-10-CM | POA: Diagnosis not present

## 2014-12-19 DIAGNOSIS — Z79899 Other long term (current) drug therapy: Secondary | ICD-10-CM | POA: Diagnosis not present

## 2014-12-19 DIAGNOSIS — Z5181 Encounter for therapeutic drug level monitoring: Secondary | ICD-10-CM | POA: Diagnosis not present

## 2014-12-19 DIAGNOSIS — D638 Anemia in other chronic diseases classified elsewhere: Secondary | ICD-10-CM | POA: Diagnosis present

## 2014-12-19 DIAGNOSIS — A369 Diphtheria, unspecified: Secondary | ICD-10-CM

## 2014-12-19 DIAGNOSIS — E039 Hypothyroidism, unspecified: Secondary | ICD-10-CM | POA: Diagnosis present

## 2014-12-19 DIAGNOSIS — Z452 Encounter for adjustment and management of vascular access device: Secondary | ICD-10-CM | POA: Diagnosis not present

## 2014-12-19 LAB — CBC WITH DIFFERENTIAL/PLATELET
BASOS ABS: 0 10*3/uL (ref 0.0–0.1)
BASOS PCT: 0 %
EOS ABS: 0.1 10*3/uL (ref 0.0–0.7)
Eosinophils Relative: 1 %
HEMATOCRIT: 38.4 % (ref 36.0–46.0)
HEMOGLOBIN: 12.5 g/dL (ref 12.0–15.0)
Lymphocytes Relative: 29 %
Lymphs Abs: 2.3 10*3/uL (ref 0.7–4.0)
MCH: 29.7 pg (ref 26.0–34.0)
MCHC: 32.6 g/dL (ref 30.0–36.0)
MCV: 91.2 fL (ref 78.0–100.0)
Monocytes Absolute: 0.4 10*3/uL (ref 0.1–1.0)
Monocytes Relative: 5 %
NEUTROS ABS: 5 10*3/uL (ref 1.7–7.7)
NEUTROS PCT: 65 %
Platelets: 232 10*3/uL (ref 150–400)
RBC: 4.21 MIL/uL (ref 3.87–5.11)
RDW: 16.8 % — ABNORMAL HIGH (ref 11.5–15.5)
WBC: 7.8 10*3/uL (ref 4.0–10.5)

## 2014-12-19 LAB — COMPREHENSIVE METABOLIC PANEL
ALBUMIN: 3.6 g/dL (ref 3.5–5.0)
ALT: 34 U/L (ref 14–54)
ANION GAP: 11 (ref 5–15)
AST: 38 U/L (ref 15–41)
Alkaline Phosphatase: 57 U/L (ref 38–126)
BUN: 16 mg/dL (ref 6–20)
CALCIUM: 9.5 mg/dL (ref 8.9–10.3)
CO2: 31 mmol/L (ref 22–32)
Chloride: 94 mmol/L — ABNORMAL LOW (ref 101–111)
Creatinine, Ser: 1.22 mg/dL — ABNORMAL HIGH (ref 0.44–1.00)
GFR calc Af Amer: 45 mL/min — ABNORMAL LOW (ref >60)
GFR calc non Af Amer: 39 mL/min — ABNORMAL LOW (ref >60)
Glucose, Bld: 106 mg/dL — ABNORMAL HIGH (ref 65–99)
POTASSIUM: 3.5 mmol/L (ref 3.5–5.1)
SODIUM: 136 mmol/L (ref 135–145)
TOTAL PROTEIN: 7.8 g/dL (ref 6.5–8.1)
Total Bilirubin: 0.5 mg/dL (ref 0.3–1.2)

## 2014-12-19 LAB — URINE MICROSCOPIC-ADD ON

## 2014-12-19 LAB — I-STAT CG4 LACTIC ACID, ED
LACTIC ACID, VENOUS: 1.06 mmol/L (ref 0.5–2.0)
LACTIC ACID, VENOUS: 1.46 mmol/L (ref 0.5–2.0)

## 2014-12-19 LAB — URINALYSIS, ROUTINE W REFLEX MICROSCOPIC
Bilirubin Urine: NEGATIVE
Glucose, UA: NEGATIVE mg/dL
Hgb urine dipstick: NEGATIVE
KETONES UR: NEGATIVE mg/dL
NITRITE: NEGATIVE
PH: 5.5 (ref 5.0–8.0)
Protein, ur: NEGATIVE mg/dL
SPECIFIC GRAVITY, URINE: 1.012 (ref 1.005–1.030)

## 2014-12-19 MED ORDER — ADULT MULTIVITAMIN W/MINERALS CH
1.0000 | ORAL_TABLET | Freq: Every day | ORAL | Status: DC
  Administered 2014-12-20 – 2014-12-24 (×5): 1 via ORAL
  Filled 2014-12-19 (×5): qty 1

## 2014-12-19 MED ORDER — CALCIUM CARBONATE ANTACID 500 MG PO CHEW: 2.0000 | CHEWABLE_TABLET | Freq: Every day | ORAL | Status: DC | PRN

## 2014-12-19 MED ORDER — VITAMIN D 1000 UNITS PO TABS
1000.0000 [IU] | ORAL_TABLET | Freq: Every day | ORAL | Status: DC
  Administered 2014-12-20 – 2014-12-24 (×5): 1000 [IU] via ORAL
  Filled 2014-12-19 (×5): qty 1

## 2014-12-19 MED ORDER — NITROGLYCERIN 0.4 MG SL SUBL
0.4000 mg | SUBLINGUAL_TABLET | SUBLINGUAL | Status: DC | PRN
Start: 2014-12-19 — End: 2014-12-25

## 2014-12-19 MED ORDER — ACETAMINOPHEN 325 MG PO TABS
650.0000 mg | ORAL_TABLET | Freq: Four times a day (QID) | ORAL | Status: DC | PRN
  Administered 2014-12-21: 650 mg via ORAL
  Filled 2014-12-19: qty 2

## 2014-12-19 MED ORDER — MILRINONE IN DEXTROSE 20 MG/100ML IV SOLN: 0.1250 ug/kg/min | INTRAVENOUS | Status: DC

## 2014-12-19 MED ORDER — FUROSEMIDE 40 MG PO TABS
40.0000 mg | ORAL_TABLET | Freq: Every day | ORAL | Status: DC
  Administered 2014-12-20 – 2014-12-24 (×5): 40 mg via ORAL
  Filled 2014-12-19 (×5): qty 1

## 2014-12-19 MED ORDER — SPIRONOLACTONE 25 MG PO TABS
25.0000 mg | ORAL_TABLET | Freq: Every day | ORAL | Status: DC
  Administered 2014-12-20 – 2014-12-24 (×5): 25 mg via ORAL
  Filled 2014-12-19 (×7): qty 1

## 2014-12-19 MED ORDER — MILRINONE IN DEXTROSE 20 MG/100ML IV SOLN
0.1250 ug/kg/min | INTRAVENOUS | Status: DC
  Administered 2014-12-20 – 2014-12-23 (×4): 0.125 ug/kg/min via INTRAVENOUS
  Filled 2014-12-19 (×4): qty 100

## 2014-12-19 MED ORDER — SODIUM CHLORIDE 0.9 % IJ SOLN: 3.0000 mL | INTRAMUSCULAR | Status: DC | PRN

## 2014-12-19 MED ORDER — LEVOTHYROXINE SODIUM 25 MCG PO TABS
25.0000 ug | ORAL_TABLET | Freq: Every day | ORAL | Status: DC
  Administered 2014-12-20 – 2014-12-24 (×5): 25 ug via ORAL
  Filled 2014-12-19 (×5): qty 1

## 2014-12-19 MED ORDER — ALPRAZOLAM 0.25 MG PO TABS
0.2500 mg | ORAL_TABLET | Freq: Three times a day (TID) | ORAL | Status: DC | PRN
  Administered 2014-12-22 – 2014-12-23 (×3): 0.25 mg via ORAL
  Filled 2014-12-19 (×3): qty 1

## 2014-12-19 MED ORDER — SODIUM CHLORIDE 0.9 % IV SOLN: 250.0000 mL | INTRAVENOUS | Status: DC | PRN

## 2014-12-19 MED ORDER — VANCOMYCIN HCL IN DEXTROSE 1-5 GM/200ML-% IV SOLN
1000.0000 mg | Freq: Once | INTRAVENOUS | Status: AC
  Administered 2014-12-19: 1000 mg via INTRAVENOUS
  Filled 2014-12-19: qty 200

## 2014-12-19 MED ORDER — MILRINONE IN DEXTROSE 20 MG/100ML IV SOLN
0.1250 ug/kg/min | INTRAVENOUS | Status: DC
  Filled 2014-12-19: qty 100

## 2014-12-19 MED ORDER — DIGOXIN 125 MCG PO TABS
0.0625 mg | ORAL_TABLET | Freq: Every day | ORAL | Status: DC
  Administered 2014-12-20 – 2014-12-24 (×5): 0.0625 mg via ORAL
  Filled 2014-12-19 (×6): qty 1

## 2014-12-19 MED ORDER — BOOST PO LIQD
237.0000 mL | Freq: Every day | ORAL | Status: DC
  Administered 2014-12-20: 237 mL via ORAL
  Filled 2014-12-19 (×2): qty 237

## 2014-12-19 MED ORDER — CALCIUM CARBONATE 1500 (600 CA) MG PO TABS: 600.0000 mg | ORAL_TABLET | Freq: Two times a day (BID) | ORAL | Status: DC

## 2014-12-19 MED ORDER — SODIUM CHLORIDE 0.9 % IJ SOLN
3.0000 mL | Freq: Two times a day (BID) | INTRAMUSCULAR | Status: DC
  Administered 2014-12-20 – 2014-12-23 (×5): 3 mL via INTRAVENOUS

## 2014-12-19 MED ORDER — ACETAMINOPHEN 650 MG RE SUPP: 650.0000 mg | Freq: Four times a day (QID) | RECTAL | Status: DC | PRN

## 2014-12-19 MED ORDER — B COMPLEX-C PO TABS
1.0000 | ORAL_TABLET | Freq: Every evening | ORAL | Status: DC
  Administered 2014-12-20 – 2014-12-24 (×5): 1 via ORAL
  Filled 2014-12-19 (×5): qty 1

## 2014-12-19 MED ORDER — VANCOMYCIN HCL IN DEXTROSE 1-5 GM/200ML-% IV SOLN
1000.0000 mg | INTRAVENOUS | Status: DC
  Administered 2014-12-20 – 2014-12-22 (×3): 1000 mg via INTRAVENOUS
  Filled 2014-12-19 (×4): qty 200

## 2014-12-19 MED ORDER — SALINE SPRAY 0.65 % NA SOLN: 1.0000 | NASAL | Status: DC | PRN

## 2014-12-19 MED ORDER — POTASSIUM CHLORIDE ER 10 MEQ PO TBCR
10.0000 meq | EXTENDED_RELEASE_TABLET | Freq: Every day | ORAL | Status: DC
  Administered 2014-12-20: 10 meq via ORAL
  Filled 2014-12-19 (×3): qty 1

## 2014-12-19 MED ORDER — SODIUM CHLORIDE 0.9 % IJ SOLN
3.0000 mL | Freq: Two times a day (BID) | INTRAMUSCULAR | Status: DC
  Administered 2014-12-20 – 2014-12-21 (×4): 3 mL via INTRAVENOUS

## 2014-12-19 MED ORDER — POLYETHYLENE GLYCOL 3350 17 G PO PACK: 17.0000 g | PACK | Freq: Every day | ORAL | Status: DC | PRN

## 2014-12-19 MED ORDER — ALBUTEROL SULFATE (2.5 MG/3ML) 0.083% IN NEBU: 2.5000 mg | INHALATION_SOLUTION | Freq: Four times a day (QID) | RESPIRATORY_TRACT | Status: DC | PRN

## 2014-12-19 MED ORDER — PANTOPRAZOLE SODIUM 40 MG PO TBEC
40.0000 mg | DELAYED_RELEASE_TABLET | Freq: Two times a day (BID) | ORAL | Status: DC
  Administered 2014-12-20 – 2014-12-24 (×10): 40 mg via ORAL
  Filled 2014-12-19 (×10): qty 1

## 2014-12-19 MED ORDER — MILRINONE IN DEXTROSE 20 MG/100ML IV SOLN
0.1250 ug/kg/min | INTRAVENOUS | Status: DC
Start: 2014-12-19 — End: 2014-12-19

## 2014-12-19 MED ORDER — CALCIUM CARBONATE 1250 (500 CA) MG PO TABS
1.0000 | ORAL_TABLET | Freq: Two times a day (BID) | ORAL | Status: DC
  Administered 2014-12-20 – 2014-12-24 (×9): 500 mg via ORAL
  Filled 2014-12-19 (×10): qty 1

## 2014-12-19 MED ORDER — B COMPLEX PO TABS: 1.0000 | ORAL_TABLET | Freq: Every evening | ORAL | Status: DC

## 2014-12-19 MED ORDER — ACETAMINOPHEN 500 MG PO TABS: 500.0000 mg | ORAL_TABLET | Freq: Four times a day (QID) | ORAL | Status: DC | PRN

## 2014-12-19 MED ORDER — PROMETHAZINE HCL 25 MG PO TABS: 25.0000 mg | ORAL_TABLET | Freq: Three times a day (TID) | ORAL | Status: DC | PRN

## 2014-12-19 MED ORDER — CITALOPRAM HYDROBROMIDE 20 MG PO TABS
20.0000 mg | ORAL_TABLET | Freq: Every day | ORAL | Status: DC
  Administered 2014-12-20 – 2014-12-24 (×5): 20 mg via ORAL
  Filled 2014-12-19 (×5): qty 1

## 2014-12-19 MED ORDER — RIVAROXABAN 20 MG PO TABS
20.0000 mg | ORAL_TABLET | Freq: Every day | ORAL | Status: DC
  Administered 2014-12-20 (×2): 20 mg via ORAL
  Filled 2014-12-19 (×2): qty 1

## 2014-12-19 MED ORDER — TRAMADOL HCL 50 MG PO TABS: 50.0000 mg | ORAL_TABLET | Freq: Four times a day (QID) | ORAL | Status: DC | PRN

## 2014-12-19 NOTE — Telephone Encounter (Signed)
Received BC that were drawn by [facility] on 11/29 PM:  Gram positive rods  Per Dr Haroldine Laws pt needs to go to ER to be admitted and started on IV antibiotics.  Called and spoke w/pt, she is aware, she states her children are at work at this time and she does not have a ride currently.  She will call her son and see if he can take a little later today.  Also called and spoke w/Arlee Bossard Sallee Provencal, RN w/Dr Caryl Comes and notified her of findings as pt is sch for CRT-D on 12/7.

## 2014-12-19 NOTE — Telephone Encounter (Signed)
Ms. Alexandra [name] called and said she had a friend who will be taking her to to the ED to be admitted.

## 2014-12-19 NOTE — ED Notes (Signed)
Pt sent to ED by her PCP due to her PICC line being infected. She reports they took blood cultures and it was found to be infected. She is scheduled to have a pacemaker placed next week. Pt states she had a fever over the weekend and has been feeling weak.

## 2014-12-19 NOTE — ED Notes (Signed)
Site of PICC line to right arm appears WDL, no redness, no swelling noted. Pt denies pain.

## 2014-12-19 NOTE — Consult Note (Signed)
Advanced Heart Failure Team Consult Note   Primary Cardiologist:  [place] HF Cardiologist: [place]  Reason for Consultation: + blood culture  HPI:    Alexandra [name] is a 81 y.o. female with chronic systolic CHF Echo 0000000 EF 15%, HX of PE/DVT 08/2014 on Xarelto, CKD stage 3, LBBB, and hypothyroidism. Who we are seeing due to + blood culture  She was admitted in July 2016 for Abdominal pain and SOB. Her Gallbladder was thought to be causing her pain, so a lap chole was performed that admission. Echo on 08/15/14 showed EF 20%. There were thoughts that she may have stress induced CMP due to the death of her husband in 06/02/22 after several months of hospice care. However EF did not improve.   She was directly admitted to [facility] on 10/11/14 with concerns for low output with SBPs in 80s and tachycardia. PICC line was placed with initial co-ox of 61%.CVP was 15. Milrinone started when co-ox dropped to 53%. Has been maintained at home on milrinone with minimal improvement in her symptoms. Recently saw Dr. Caryl Comes and plan was to proceed with CRT upgrade to try and improve her HF.   Over past 5 days has had fever to 100 degrees without localizing features. Blood cultures drawn earlier this week by [facility]. Today 1/2 bck back for GP rods and patient sent to ER for evaluation.   Continues to feel fatigued. No dysuria, cough or ab pain. + soft stools. WBC 7.0 with normal differential. PiCC site ok.    Review of Systems: [y] = yes, [ ]  = no   General: Weight gain [ ] ; Weight loss [ ] ; Anorexia [ ] ; Fatigue Blue.Reese ]; Fever Blue.Reese ]; Chills [ ] ; Weakness [ y]  Cardiac: Chest pain/pressure [ ] ; Resting SOB [ ] ; Exertional SOB [ ] ; Orthopnea [ ] ; Pedal Edema [ ] ; Palpitations [ ] ; Syncope [ ] ; Presyncope [ ] ; Paroxysmal nocturnal dyspnea[ ]   Pulmonary: Cough [ ] ; Wheezing[ ] ; Hemoptysis[ ] ; Sputum [ ] ; Snoring [ ]   GI: Vomiting[ ] ; Dysphagia[ ] ; Melena[ ] ; Hematochezia [ ] ; Heartburn[ ] ; Abdominal pain [ ] ; Constipation  [ ] ; Diarrhea [ ] ; BRBPR [ ]   GU: Hematuria[ ] ; Dysuria [ ] ; Nocturia[ ]   Vascular: Pain in legs with walking [ ] ; Pain in feet with lying flat [ ] ; Non-healing sores [ ] ; Stroke [ ] ; TIA [ ] ; Slurred speech [ ] ;  Neuro: Headaches[ ] ; Vertigo[ ] ; Seizures[ ] ; Paresthesias[ ] ;Blurred vision [ ] ; Diplopia [ ] ; Vision changes [ ]   Ortho/Skin: Arthritis Blue.Reese ]; Joint pain [ y]; Muscle pain [ ] ; Joint swelling [ ] ; Back Pain [ ] ; Rash [ ]   Psych: Depression[ ] ; Anxiety[ ]   Heme: Bleeding problems [ ] ; Clotting disorders [ ] ; Anemia [ ]   Endocrine: Diabetes [ ] ; Thyroid dysfunction[ ]   Home Medications Prior to Admission medications   Medication Sig Start Date End Date Taking? Authorizing Provider  acetaminophen (TYLENOL) 500 MG tablet Take 500 mg by mouth every 6 (six) hours as needed for mild pain.    Historical Provider, MD  ALPRAZolam Duanne Moron) 0.25 MG tablet Take 0.25 mg by mouth 3 (three) times daily as needed for anxiety.    Historical Provider, MD  b complex vitamins tablet Take 1 tablet by mouth every evening.     Historical Provider, MD  Calcium Carbonate (CALTRATE 600 PO) Take 600 mg by mouth 2 (two) times daily.    Historical Provider, MD  calcium carbonate (TUMS - DOSED  IN MG ELEMENTAL CALCIUM) 500 MG chewable tablet Chew 2 tablets by mouth daily as needed for indigestion or heartburn.    Historical Provider, MD  cholecalciferol (VITAMIN D) 1000 UNITS tablet Take 1,000 Units by mouth daily.    Historical Provider, MD  citalopram (CELEXA) 20 MG tablet Take 1 tablet (20 mg total) by mouth daily. 11/27/14   Jolaine Artist, MD  digoxin 62.5 MCG TABS Take 0.0625 mg by mouth daily. 10/15/14   Shirley Friar, PA-C  furosemide (LASIX) 40 MG tablet Take 1 tablet (40 mg total) by mouth daily. 10/15/14   Shirley Friar, PA-C  lactose free nutrition (BOOST) LIQD Take 237 mLs by mouth daily with breakfast.     Historical Provider, MD  levothyroxine (SYNTHROID, LEVOTHROID) 25 MCG tablet  Take one tablet by mouth once daily in the morning before breakfast for thyroid    Historical Provider, MD  milrinone (PRIMACOR) 20 MG/100ML SOLN infusion Inject 9.875 mcg/min into the vein continuous. 10/15/14   Shirley Friar, PA-C  Multiple Vitamin (MULITIVITAMIN WITH MINERALS) TABS Take 1 tablet by mouth daily.    Historical Provider, MD  nitroGLYCERIN (NITROSTAT) 0.4 MG SL tablet Place 1 tablet (0.4 mg total) under the tongue every 5 (five) minutes as needed for chest pain. 11/22/14   Jolaine Artist, MD  OXYGEN Inhale 2 L into the lungs daily.    Historical Provider, MD  pantoprazole (PROTONIX) 40 MG tablet Take 40 mg by mouth 2 (two) times daily.    Historical Provider, MD  polyethylene glycol (MIRALAX / GLYCOLAX) packet Take 17 g by mouth daily as needed for mild constipation.    Historical Provider, MD  potassium chloride (K-DUR) 10 MEQ tablet Take 1 tablet (10 mEq total) by mouth daily. 10/15/14   Shirley Friar, PA-C  promethazine (PHENERGAN) 25 MG tablet Take one tablet by mouth every 8 hours as needed for nausea    Historical Provider, MD  sodium chloride (OCEAN) 0.65 % SOLN nasal spray Place 1 spray into both nostrils as needed for congestion.    Historical Provider, MD  spironolactone (ALDACTONE) 25 MG tablet Take 1 tablet (25 mg total) by mouth daily. 10/15/14   Shirley Friar, PA-C  traMADol (ULTRAM) 50 MG tablet Take 1 tablet (50 mg total) by mouth every 6 (six) hours as needed for moderate pain. 08/23/14   Charlynne Cousins, MD  VENTOLIN HFA 108 (90 BASE) MCG/ACT inhaler INhaLe 2 PufFS by mouth every 4 hours as needed for shortness of breath or wheezing 07/19/14   Historical Provider, MD  XARELTO 20 MG TABS tablet TAKE 1 TABLET BY MOUTH EVERY DAY 12/19/14   Jolaine Artist, MD    Past Medical History: Past Medical History  Diagnosis Date  . Arthritis   . Headache(784.0)   . Anxiety   . Depression   . Asthma   . CAP (community acquired pneumonia)  12/19/2012  . Intermittent LBBB (left bundle branch block) 12/21/2012  . Obesity   . Peripheral neuropathy ([place])   . Sacroiliitis, not elsewhere classified ([place])   . Osteopenia   . Hypercholesteremia   . Neuropathy ([place])   . Trigger finger     [name]  . Chest pain     NM stress test 05/2011 Normal  . DOE (dyspnea on exertion)     No SOB at rest  . Fatigue     excertional  . breast ca dx'd 2000    left  . Essential (  primary) hypertension 04/02/2014  . Adult hypothyroidism 04/02/2014  . Hereditary and idiopathic peripheral neuropathy 07/02/2014  . Leg weakness   . CKD (chronic kidney disease) stage 3, GFR 30-59 ml/min 08/13/2014  . Anemia of chronic disease 08/13/2014  . CHF (congestive heart failure) ([place]) 08/19/2014  . Situational depression     Past Surgical History: Past Surgical History  Procedure Laterality Date  . Leg surgery    . Breast lumpectomy      breast cancer  . Lumbar laminectomy    . Achilles tendon surgery    . Tonsillectomy    . Vesicovaginal fistula closure w/ tah      DC hysterectomy  . Polypectomy    . Cholecystectomy N/A 08/13/2014    Procedure: LAPAROSCOPIC CHOLECYSTECTOMY;  Surgeon: Stark Klein, MD;  Location: [facility] ORS;  Service: General;  Laterality: N/A;    Family History: Family History  Problem Relation Age of Onset  . Diabetes Brother   . Alzheimer's disease Sister   . CVA Daughter     01/21/09  . Pancreatic cancer Father 29  . Pneumonia Mother 17    cause of death  . Heart attack Neg Hx   . Stroke Daughter   . Hypertension Daughter     Social History: Social History   Social History  . Marital Status: Married    Spouse Name: N/A  . Number of Children: 5  . Years of Education: HS   Occupational History  . Retired    Social History Main Topics  . Smoking status: Never Smoker   . Smokeless tobacco: Never Used  . Alcohol Use: No  . Drug Use: No  . Sexual Activity: Not Asked   Other Topics Concern  . None   Social History  Narrative   Lives at home alone.   Right-handed.   Two cups caffeine daily (coffee).    Allergies:  Allergies  Allergen Reactions  . Amiodarone Shortness Of Breath and Nausea And Vomiting  . Cymbalta [Duloxetine Hcl] Other (See Comments)    Depression  . Lyrica [Pregabalin] Other (See Comments)    Blurry vision    Objective:    Vital Signs:   Temp:  [100 F (37.8 C)-101 F (38.3 C)] 100 F (37.8 C) (12/01 1857) Pulse Rate:  [112-114] 112 (12/01 1849) Resp:  [11-20] 13 (12/01 1849) BP: (110-128)/(59-68) 116/64 mmHg (12/01 1849) SpO2:  [94 %-96 %] 96 % (12/01 1849) Weight:  [164 lb (74.39 kg)] 164 lb (74.39 kg) (12/01 1708)    Weight change: Filed Weights   12/19/14 1708  Weight: 164 lb (74.39 kg)    Intake/Output:  No intake or output data in the 24 hours ending 12/19/14 1934   Physical Exam: General: In wheelchair, NAD HEENT: Normal, without mass or lesion. Neck: Supple, no bruits. JVP 6-7 cm. Carotids 2+. No thyromegaly or nodule. Heart: PMI nondisplaced tachy, no s4. +S3, no murmur. Lungs: Slightly diminished R base Abdomen: Soft, NT, ND, no HSM +BS x 4.  Extremities: No clubbing, cyanosis. Trace ankle edema. RUE picc looks fine. No exudate or erythema Neuro: Alert and oriented X 3. Moves all extremities spontaneously.  Psych: Affect pleasant  Telemetry: Sinus tach with LBBB  Labs: Basic Metabolic Panel:  Recent Labs Lab 12/18/14 1315 12/19/14 1747  NA 135 136  K 3.8 3.5  CL 97* 94*  CO2 27 31  GLUCOSE 154* 106*  BUN 15 16  CREATININE 1.20* 1.22*  CALCIUM 9.1 9.5    Liver Function Tests:  Recent Labs Lab 12/19/14 1747  AST 38  ALT 34  ALKPHOS 57  BILITOT 0.5  PROT 7.8  ALBUMIN 3.6   No results for input(s): LIPASE, AMYLASE in the last 168 hours. No results for input(s): AMMONIA in the last 168 hours.  CBC:  Recent Labs Lab 12/18/14 1315 12/19/14 1747  WBC 6.0 7.8  NEUTROABS  --  5.0  HGB 12.1 12.5  HCT 37.2 38.4   MCV 91.2 91.2  PLT 202 232    Cardiac Enzymes: No results for input(s): CKTOTAL, CKMB, CKMBINDEX, TROPONINI in the last 168 hours.  BNP: BNP (last 3 results)  Recent Labs  08/16/14 0855 08/19/14 1523 10/11/14 1459  BNP 1169.9* 1627.6* 2298.4*    ProBNP (last 3 results)  Recent Labs  09/25/14 1332  PROBNP 2219.0*     CBG: No results for input(s): GLUCAP in the last 168 hours.  Coagulation Studies:  Recent Labs  12/18/14 1315  LABPROT 27.0*  INR 2.54*    Other results:   Imaging: Dg Chest 2 View  12/19/2014  CLINICAL DATA:  Fever 5 days EXAM: CHEST  2 VIEW COMPARISON:  12/16/2014 FINDINGS: There is elevation of the right diaphragm. There is discoid left basilar atelectasis. There is no focal parenchymal opacity. There is no pleural effusion or pneumothorax. There is cardiomegaly. There is a right-sided PICC line with the tip projecting over the SVC. The osseous structures are unremarkable. IMPRESSION: No active cardiopulmonary disease. Electronically Signed   By: Kathreen Devoid   On: 12/19/2014 18:04      Medications:     Current Medications:     Infusions: . vancomycin        Assessment:   1. Fever with Bcx 1/2 Gram positive rods 2. Chronic Systolic Heart failure - NYHA Class IV Echo 10/02/14 EF 15%     -requiring milrinone 3. Recent PE/ DVT bilateral on Xarelto 4. CKD Stage 3  5. Hx of LBBB 6. Intolerance of amiodarone due to possible acute lung toxicity   Plan/Discussion:     She has had persistent fevers on unclear source. Now with bcx 1/1 GP rods. Concern obviously for bacteremia related to PICC line but GP rods are usually diphtheroids which is skin contaminant. Bcx have been drawn in ER and will start vancomycin until we have speciation back on first set of cultures and we get results of f/u cx. Would also check UA. Check stool for C.diff. Will d/w ID in am. Would not pull PICC line yet. Continue milrinone.   Have asked [facility] team  to admit to help sort out fevers. We will continue to follow to manage HF and milrinone. Continue Xarelto for h/o PE/DVT  Length of Stay:   Glori Bickers 12/19/2014, 7:34 PM  Advanced Heart Failure Team Pager [phone] (M-F; [place])  Please contact [facility] for night-coverage after hours (4p -7a ) and weekends on [email or web address]

## 2014-12-19 NOTE — ED Provider Notes (Signed)
CSN: [id number]     Arrival date & time 12/19/14  1645 History   None    Chief Complaint  Patient presents with  . Vascular Access Problem    Complaint fever (Consider location/radiation/quality/duration/timing/severity/associated sxs/prior Treatment) HPI She noted to have fever intermittent onset 5 days ago. She is otherwise asymptomatic. She was noted to have infected PICC line with bacteremia and gram-positive rods today and told to come to the emergency department for further treatment. She is presently asymptomatic, without treatment. Currently receiving milrinone for congestive heart failure. Other associated symptoms Past Medical History  Diagnosis Date  . Arthritis   . Headache(784.0)   . Anxiety   . Depression   . Asthma   . CAP (community acquired pneumonia) 12/19/2012  . Intermittent LBBB (left bundle branch block) 12/21/2012  . Obesity   . Peripheral neuropathy ([place])   . Sacroiliitis, not elsewhere classified ([place])   . Osteopenia   . Hypercholesteremia   . Neuropathy ([place])   . Trigger finger     [name]  . Chest pain     NM stress test 05/2011 Normal  . DOE (dyspnea on exertion)     No SOB at rest  . Fatigue     excertional  . breast ca dx'd 2000    left  . Essential (primary) hypertension 04/02/2014  . Adult hypothyroidism 04/02/2014  . Hereditary and idiopathic peripheral neuropathy 07/02/2014  . Leg weakness   . CKD (chronic kidney disease) stage 3, GFR 30-59 ml/min 08/13/2014  . Anemia of chronic disease 08/13/2014  . CHF (congestive heart failure) ([place]) 08/19/2014  . Situational depression    Past Surgical History  Procedure Laterality Date  . Leg surgery    . Breast lumpectomy      breast cancer  . Lumbar laminectomy    . Achilles tendon surgery    . Tonsillectomy    . Vesicovaginal fistula closure w/ tah      DC hysterectomy  . Polypectomy    . Cholecystectomy N/A 08/13/2014    Procedure: LAPAROSCOPIC CHOLECYSTECTOMY;  Surgeon: Stark Klein, MD;   Location: [facility];  Service: General;  Laterality: N/A;   Family History  Problem Relation Age of Onset  . Diabetes Brother   . Alzheimer's disease Sister   . CVA Daughter     01/21/09  . Pancreatic cancer Father 41  . Pneumonia Mother 69    cause of death  . Heart attack Neg Hx   . Stroke Daughter   . Hypertension Daughter    Social History  Substance Use Topics  . Smoking status: Never Smoker   . Smokeless tobacco: Never Used  . Alcohol Use: No   OB History    No data available     Review of Systems  Constitutional: Positive for fever.  HENT: Negative.   Respiratory: Negative.   Cardiovascular: Negative.   Gastrointestinal: Negative.   Musculoskeletal: Negative.   Skin: Negative.   Neurological: Negative.   Psychiatric/Behavioral: Negative.   All other systems reviewed and are negative.     Allergies  Amiodarone; Cymbalta; and Lyrica  Home Medications   Prior to Admission medications   Medication Sig Start Date End Date Taking? Authorizing Provider  acetaminophen (TYLENOL) 500 MG tablet Take 500 mg by mouth every 6 (six) hours as needed for mild pain.    Historical Provider, MD  ALPRAZolam Duanne Moron) 0.25 MG tablet Take 0.25 mg by mouth 3 (three) times daily as needed for anxiety.  Historical Provider, MD  b complex vitamins tablet Take 1 tablet by mouth every evening.     Historical Provider, MD  Calcium Carbonate (CALTRATE 600 PO) Take 600 mg by mouth 2 (two) times daily.    Historical Provider, MD  calcium carbonate (TUMS - DOSED IN MG ELEMENTAL CALCIUM) 500 MG chewable tablet Chew 2 tablets by mouth daily as needed for indigestion or heartburn.    Historical Provider, MD  cholecalciferol (VITAMIN D) 1000 UNITS tablet Take 1,000 Units by mouth daily.    Historical Provider, MD  citalopram (CELEXA) 20 MG tablet Take 1 tablet (20 mg total) by mouth daily. 11/27/14   Jolaine Artist, MD  digoxin 62.5 MCG TABS Take 0.0625 mg by mouth daily. 10/15/14   Shirley Friar, PA-C  furosemide (LASIX) 40 MG tablet Take 1 tablet (40 mg total) by mouth daily. 10/15/14   Shirley Friar, PA-C  lactose free nutrition (BOOST) LIQD Take 237 mLs by mouth daily with breakfast.     Historical Provider, MD  levothyroxine (SYNTHROID, LEVOTHROID) 25 MCG tablet Take one tablet by mouth once daily in the morning before breakfast for thyroid    Historical Provider, MD  milrinone (PRIMACOR) 20 MG/100ML SOLN infusion Inject 9.875 mcg/min into the vein continuous. 10/15/14   Shirley Friar, PA-C  Multiple Vitamin (MULITIVITAMIN WITH MINERALS) TABS Take 1 tablet by mouth daily.    Historical Provider, MD  nitroGLYCERIN (NITROSTAT) 0.4 MG SL tablet Place 1 tablet (0.4 mg total) under the tongue every 5 (five) minutes as needed for chest pain. 11/22/14   Jolaine Artist, MD  OXYGEN Inhale 2 L into the lungs daily.    Historical Provider, MD  pantoprazole (PROTONIX) 40 MG tablet Take 40 mg by mouth 2 (two) times daily.    Historical Provider, MD  polyethylene glycol (MIRALAX / GLYCOLAX) packet Take 17 g by mouth daily as needed for mild constipation.    Historical Provider, MD  potassium chloride (K-DUR) 10 MEQ tablet Take 1 tablet (10 mEq total) by mouth daily. 10/15/14   Shirley Friar, PA-C  promethazine (PHENERGAN) 25 MG tablet Take one tablet by mouth every 8 hours as needed for nausea    Historical Provider, MD  sodium chloride (OCEAN) 0.65 % SOLN nasal spray Place 1 spray into both nostrils as needed for congestion.    Historical Provider, MD  spironolactone (ALDACTONE) 25 MG tablet Take 1 tablet (25 mg total) by mouth daily. 10/15/14   Shirley Friar, PA-C  traMADol (ULTRAM) 50 MG tablet Take 1 tablet (50 mg total) by mouth every 6 (six) hours as needed for moderate pain. 08/23/14   Charlynne Cousins, MD  VENTOLIN HFA 108 (90 BASE) MCG/ACT inhaler INhaLe 2 PufFS by mouth every 4 hours as needed for shortness of breath or wheezing 07/19/14    Historical Provider, MD  XARELTO 20 MG TABS tablet TAKE 1 TABLET BY MOUTH EVERY DAY 12/19/14   Shaune Pascal Bensimhon, MD   BP 110/60 mmHg  Pulse 112  Temp(Src) 101 F (38.3 C) (Oral)  Resp 18  Ht 5' 4.5" (1.638 m)  Wt 164 lb (74.39 kg)  BMI 27.73 kg/m2  SpO2 96% Physical Exam  Constitutional: She appears well-developed and well-nourished.  HENT:  Head: Normocephalic and atraumatic.  Eyes: Conjunctivae are normal. Pupils are equal, round, and reactive to light.  Neck: Neck supple. No tracheal deviation present. No thyromegaly present.  Cardiovascular: Normal rate and regular rhythm.   No murmur  heard. Pulmonary/Chest: Effort normal and breath sounds normal.  Abdominal: Soft. Bowel sounds are normal. She exhibits no distension. There is no tenderness.  Musculoskeletal: Normal range of motion. She exhibits no edema or tenderness.  Right upper extremity with PICC line in place. Not red warm or tender at insertion site  Neurological: She is alert. Coordination normal.  Skin: Skin is warm and dry. No rash noted.  Psychiatric: She has a normal mood and affect.  Nursing note and vitals reviewed.   ED Course  Procedures (including critical care time) Labs Review Labs Reviewed  CULTURE, BLOOD (ROUTINE X 2)  CULTURE, BLOOD (ROUTINE X 2)  URINE CULTURE  COMPREHENSIVE METABOLIC PANEL  CBC WITH DIFFERENTIAL/PLATELET  URINALYSIS, ROUTINE W REFLEX MICROSCOPIC (NOT AT [facility])  I-STAT CG4 LACTIC ACID, ED    Imaging Review No results found. I have personally reviewed and evaluated these images and lab results as part of my medical decision-making.   EKG Interpretation None     Results for orders placed or performed during the hospital encounter of 12/19/14  Comprehensive metabolic panel  Result Value Ref Range   Sodium 136 135 - 145 mmol/L   Potassium 3.5 3.5 - 5.1 mmol/L   Chloride 94 (L) 101 - 111 mmol/L   CO2 31 22 - 32 mmol/L   Glucose, Bld 106 (H) 65 - 99 mg/dL   BUN 16 6 - 20  mg/dL   Creatinine, Ser 1.22 (H) 0.44 - 1.00 mg/dL   Calcium 9.5 8.9 - 10.3 mg/dL   Total Protein 7.8 6.5 - 8.1 g/dL   Albumin 3.6 3.5 - 5.0 g/dL   AST 38 15 - 41 U/L   ALT 34 14 - 54 U/L   Alkaline Phosphatase 57 38 - 126 U/L   Total Bilirubin 0.5 0.3 - 1.2 mg/dL   GFR calc non Af Amer 39 (L) >60 mL/min   GFR calc Af Amer 45 (L) >60 mL/min   Anion gap 11 5 - 15  CBC with Differential  Result Value Ref Range   WBC 7.8 4.0 - 10.5 K/uL   RBC 4.21 3.87 - 5.11 MIL/uL   Hemoglobin 12.5 12.0 - 15.0 g/dL   HCT 38.4 36.0 - 46.0 %   MCV 91.2 78.0 - 100.0 fL   MCH 29.7 26.0 - 34.0 pg   MCHC 32.6 30.0 - 36.0 g/dL   RDW 16.8 (H) 11.5 - 15.5 %   Platelets 232 150 - 400 K/uL   Neutrophils Relative % 65 %   Neutro Abs 5.0 1.7 - 7.7 K/uL   Lymphocytes Relative 29 %   Lymphs Abs 2.3 0.7 - 4.0 K/uL   Monocytes Relative 5 %   Monocytes Absolute 0.4 0.1 - 1.0 K/uL   Eosinophils Relative 1 %   Eosinophils Absolute 0.1 0.0 - 0.7 K/uL   Basophils Relative 0 %   Basophils Absolute 0.0 0.0 - 0.1 K/uL  I-Stat CG4 Lactic Acid, ED (Not at [facility])  Result Value Ref Range   Lactic Acid, Venous 1.06 0.5 - 2.0 mmol/L   Dg Chest 2 View  12/19/2014  CLINICAL DATA:  Fever 5 days EXAM: CHEST  2 VIEW COMPARISON:  12/16/2014 FINDINGS: There is elevation of the right diaphragm. There is discoid left basilar atelectasis. There is no focal parenchymal opacity. There is no pleural effusion or pneumothorax. There is cardiomegaly. There is a right-sided PICC line with the tip projecting over the SVC. The osseous structures are unremarkable. IMPRESSION: No active cardiopulmonary disease. Electronically  Signed   By: Kathreen Devoid   On: 12/19/2014 18:04   Dg Chest 2 View  12/16/2014  CLINICAL DATA:  Decreased breath sounds on the left, Productive cough x 5 months, CHF, asthma, HTN, SOB, there are no other chest complaints EXAM: CHEST  2 VIEW COMPARISON:  10/14/2014 and 10/09/2014 FINDINGS: Cardiac silhouette is mildly  enlarged. Aorta is mildly uncoiled. No mediastinal or hilar masses or convincing adenopathy. Elevation of left hemidiaphragm is stable. There is linear opacity left lung base consistent with atelectasis. Lungs are otherwise clear. No pleural effusion or pneumothorax. Right sided PICC has its tip in the lower superior vena cava. Bony thorax is demineralized but grossly intact. IMPRESSION: 1. No acute cardiopulmonary disease. 2. Elevated left hemidiaphragm. Left lung base opacity similar to the prior studies consistent with atelectasis, scarring or a combination. Electronically Signed   By: Lajean Manes M.D.   On: 12/16/2014 16:37   Chest xray viewed by me MDM  Spoke with Dr.Bensihmon requests admission to medical service for intravenous vancomycin. Pharmacy consulted to dose vancomycin. Etiology will consult on case Final diagnoses:  None   spoke with Dr.Khan who arranged for inpatient stay. Dr. Ronna Polio suggests that we do not pull PICC line out as gram-positive cocci may be consistent with skin contaminant. Patient is asymptomatic  Diagnosis bacteremia #2 renal insuffiicency     Orlie Dakin, MD 12/19/14 2043

## 2014-12-19 NOTE — Progress Notes (Addendum)
ANTIBIOTIC CONSULT NOTE - INITIAL  Pharmacy Consult for Vancomycin Indication: bacteremia  Allergies  Allergen Reactions  . Amiodarone Shortness Of Breath and Nausea And Vomiting  . Cymbalta [Duloxetine Hcl] Other (See Comments)    Depression  . Lyrica [Pregabalin] Other (See Comments)    Blurry vision    Patient Measurements: Height: 5' 4.5" (163.8 cm) Weight: 164 lb (74.39 kg) IBW/kg (Calculated) : 55.85 Adjusted Body Weight:   Vital Signs: Temp: 100 F (37.8 C) (12/01 1857) Temp Source: Oral (12/01 1857) BP: 116/64 mmHg (12/01 1849) Pulse Rate: 112 (12/01 1849) Intake/Output from previous day:   Intake/Output from this shift:    Labs:  Recent Labs  12/18/14 1315 12/19/14 1747  WBC 6.0 7.8  HGB 12.1 12.5  PLT 202 232  CREATININE 1.20* 1.22*   Estimated Creatinine Clearance: 33.7 mL/min (by C-G formula based on Cr of 1.22). No results for input(s): VANCOTROUGH, VANCOPEAK, VANCORANDOM, GENTTROUGH, GENTPEAK, GENTRANDOM, TOBRATROUGH, TOBRAPEAK, TOBRARND, AMIKACINPEAK, AMIKACINTROU, AMIKACIN in the last 72 hours.   Microbiology: Recent Results (from the past 720 hour(s))  Culture, blood (single)     Status: None (Preliminary result)   Collection Time: 12/18/14  1:10 PM  Result Value Ref Range Status   Specimen Description BLOOD LEFT ARM  Final   Special Requests BOTTLES DRAWN AEROBIC AND ANAEROBIC 5CC  Final   Culture NO GROWTH 1 DAY  Final   Report Status PENDING  Incomplete    Medical History: Past Medical History  Diagnosis Date  . Arthritis   . Headache(784.0)   . Anxiety   . Depression   . Asthma   . CAP (community acquired pneumonia) 12/19/2012  . Intermittent LBBB (left bundle branch block) 12/21/2012  . Obesity   . Peripheral neuropathy ([place])   . Sacroiliitis, not elsewhere classified ([place])   . Osteopenia   . Hypercholesteremia   . Neuropathy ([place])   . Trigger finger     [name]  . Chest pain     NM stress test 05/2011 Normal  . DOE  (dyspnea on exertion)     No SOB at rest  . Fatigue     excertional  . breast ca dx'd 2000    left  . Essential (primary) hypertension 04/02/2014  . Adult hypothyroidism 04/02/2014  . Hereditary and idiopathic peripheral neuropathy 07/02/2014  . Leg weakness   . CKD (chronic kidney disease) stage 3, GFR 30-59 ml/min 08/13/2014  . Anemia of chronic disease 08/13/2014  . CHF (congestive heart failure) ([place]) 08/19/2014  . Situational depression     Medications:   (Not in a hospital admission) Scheduled:   Infusions:  . vancomycin     Assessment: 81yo female with history of CKD3, CHF and HTN has PICC line for milrinone presents with intermittent fevers for 5 days and line infection. Pharmacy is consulted to dose vancomycin for bacteremia. Tmax 100, WBC 7.8, sCr 1.22.  Pt received vancomycin 1g IV once in the ED.   Goal of Therapy:  Vancomycin trough level 15-20 mcg/ml  Plan:  Vancomycin 1g IV q24h Measure antibiotic drug levels at steady state Follow up culture results, renal function and clinical course  Andrey Cota. Diona Foley, PharmD, BCPS Clinical Pharmacist Pager [phone] 12/19/2014,7:28 PM    ADDENDUM: Now to add Zosyn coverage.  Will add Zosyn 3.375g IV Q8H and monitor CBC and Cx. Wynona Neat, PharmD, BCPS 12/20/2014 8:25 AM

## 2014-12-19 NOTE — ED Notes (Signed)
Pt transported to xray 

## 2014-12-19 NOTE — H&P (Signed)
[facility] History and Physical  Alexandra [name] [id number] DOB: 07-12-1927 DOA: 12/19/2014  Referring physician: Orlie Dakin, MD PCP:  Melinda Crutch, MD   Chief Complaint: Fever  HPI: Alexandra [name] is a 81 y.o. female with prior history of CHF CKD HTN HLD Asthma Obesity presents with fevers and positive blood cultures. Patient has been followed in the heart failure clinic and was noted over the last week or so to be having fevers. The temperatures were low grade to about 100F and she has had no chills noted. She had blood cultures drawn as an outpatient and today the results came back as positive for gram positive rods. She was asked to come to the ED for admission. It was initially felt that she may have an infected line (PICC) and was told that this was going to be taken out. Dr Haroldine Laws called and after discussion decided to keep the line in for now. She is going to be admitted for IV vancocin however due to her risk factors. Patient is otherwise asymptomatic,   Review of Systems:  Constitutional:  No weight loss, night sweats, +Fevers, +chills, +fatigue.  HEENT:  No headaches, post nasal drip,  Cardio-vascular:  No chest pain, dizziness, palpitations  GI:  No heartburn, indigestion, abdominal pain, nausea, vomiting, +diarrhea  Resp:  No shortness of breath with exertion or at rest. No coughing up of blood Skin:  no rash or lesions.  GU:  no dysuria, change in color of urine.  Musculoskeletal:  No joint pain or swelling. No decreased range of motion  Psych:  No change in mood or affect   Past Medical History  Diagnosis Date  . Arthritis   . Headache(784.0)   . Anxiety   . Depression   . Asthma   . CAP (community acquired pneumonia) 12/19/2012  . Intermittent LBBB (left bundle branch block) 12/21/2012  . Obesity   . Peripheral neuropathy ([place])   . Sacroiliitis, not elsewhere classified ([place])   . Osteopenia   . Hypercholesteremia   . Neuropathy ([place])     . Trigger finger     [name]  . Chest pain     NM stress test 05/2011 Normal  . DOE (dyspnea on exertion)     No SOB at rest  . Fatigue     excertional  . breast ca dx'd 2000    left  . Essential (primary) hypertension 04/02/2014  . Adult hypothyroidism 04/02/2014  . Hereditary and idiopathic peripheral neuropathy 07/02/2014  . Leg weakness   . CKD (chronic kidney disease) stage 3, GFR 30-59 ml/min 08/13/2014  . Anemia of chronic disease 08/13/2014  . CHF (congestive heart failure) ([place]) 08/19/2014  . Situational depression    Past Surgical History  Procedure Laterality Date  . Leg surgery    . Breast lumpectomy      breast cancer  . Lumbar laminectomy    . Achilles tendon surgery    . Tonsillectomy    . Vesicovaginal fistula closure w/ tah      DC hysterectomy  . Polypectomy    . Cholecystectomy N/A 08/13/2014    Procedure: LAPAROSCOPIC CHOLECYSTECTOMY;  Surgeon: Stark Klein, MD;  Location: [facility];  Service: General;  Laterality: N/A;   Social History:  reports that she has never smoked. She has never used smokeless tobacco. She reports that she does not drink alcohol or use illicit drugs.  Allergies  Allergen Reactions  . Amiodarone Shortness Of Breath and Nausea And Vomiting  .  Cymbalta [Duloxetine Hcl] Other (See Comments)    Depression  . Lyrica [Pregabalin] Other (See Comments)    Blurry vision    Family History  Problem Relation Age of Onset  . Diabetes Brother   . Alzheimer's disease Sister   . CVA Daughter     01/21/09  . Pancreatic cancer Father 59  . Pneumonia Mother 28    cause of death  . Heart attack Neg Hx   . Stroke Daughter   . Hypertension Daughter      Prior to Admission medications   Medication Sig Start Date End Date Taking? Authorizing Provider  acetaminophen (TYLENOL) 500 MG tablet Take 500 mg by mouth every 6 (six) hours as needed for mild pain.    Historical Provider, MD  ALPRAZolam Duanne Moron) 0.25 MG tablet Take 0.25 mg by mouth 3  (three) times daily as needed for anxiety.    Historical Provider, MD  b complex vitamins tablet Take 1 tablet by mouth every evening.     Historical Provider, MD  Calcium Carbonate (CALTRATE 600 PO) Take 600 mg by mouth 2 (two) times daily.    Historical Provider, MD  calcium carbonate (TUMS - DOSED IN MG ELEMENTAL CALCIUM) 500 MG chewable tablet Chew 2 tablets by mouth daily as needed for indigestion or heartburn.    Historical Provider, MD  cholecalciferol (VITAMIN D) 1000 UNITS tablet Take 1,000 Units by mouth daily.    Historical Provider, MD  citalopram (CELEXA) 20 MG tablet Take 1 tablet (20 mg total) by mouth daily. 11/27/14   Jolaine Artist, MD  digoxin 62.5 MCG TABS Take 0.0625 mg by mouth daily. 10/15/14   Shirley Friar, PA-C  furosemide (LASIX) 40 MG tablet Take 1 tablet (40 mg total) by mouth daily. 10/15/14   Shirley Friar, PA-C  lactose free nutrition (BOOST) LIQD Take 237 mLs by mouth daily with breakfast.     Historical Provider, MD  levothyroxine (SYNTHROID, LEVOTHROID) 25 MCG tablet Take one tablet by mouth once daily in the morning before breakfast for thyroid    Historical Provider, MD  milrinone (PRIMACOR) 20 MG/100ML SOLN infusion Inject 9.875 mcg/min into the vein continuous. 10/15/14   Shirley Friar, PA-C  Multiple Vitamin (MULITIVITAMIN WITH MINERALS) TABS Take 1 tablet by mouth daily.    Historical Provider, MD  nitroGLYCERIN (NITROSTAT) 0.4 MG SL tablet Place 1 tablet (0.4 mg total) under the tongue every 5 (five) minutes as needed for chest pain. 11/22/14   Jolaine Artist, MD  OXYGEN Inhale 2 L into the lungs daily.    Historical Provider, MD  pantoprazole (PROTONIX) 40 MG tablet Take 40 mg by mouth 2 (two) times daily.    Historical Provider, MD  polyethylene glycol (MIRALAX / GLYCOLAX) packet Take 17 g by mouth daily as needed for mild constipation.    Historical Provider, MD  potassium chloride (K-DUR) 10 MEQ tablet Take 1 tablet (10 mEq  total) by mouth daily. 10/15/14   Shirley Friar, PA-C  promethazine (PHENERGAN) 25 MG tablet Take one tablet by mouth every 8 hours as needed for nausea    Historical Provider, MD  sodium chloride (OCEAN) 0.65 % SOLN nasal spray Place 1 spray into both nostrils as needed for congestion.    Historical Provider, MD  spironolactone (ALDACTONE) 25 MG tablet Take 1 tablet (25 mg total) by mouth daily. 10/15/14   Shirley Friar, PA-C  traMADol (ULTRAM) 50 MG tablet Take 1 tablet (50 mg total) by mouth  every 6 (six) hours as needed for moderate pain. 08/23/14   Charlynne Cousins, MD  VENTOLIN HFA 108 (90 BASE) MCG/ACT inhaler INhaLe 2 PufFS by mouth every 4 hours as needed for shortness of breath or wheezing 07/19/14   Historical Provider, MD  XARELTO 20 MG TABS tablet TAKE 1 TABLET BY MOUTH EVERY DAY 12/19/14   Jolaine Artist, MD   Physical Exam: Filed Vitals:   12/19/14 1857 12/19/14 1900 12/19/14 1915 12/19/14 1930  BP:  112/63 113/59 111/60  Pulse:  114 111 110  Temp: 100 F (37.8 C)     TempSrc: Oral     Resp:  19 14 16   Height:      Weight:      SpO2:  93% 93% 92%    Wt Readings from Last 3 Encounters:  12/19/14 74.39 kg (164 lb)  12/06/14 74.571 kg (164 lb 6.4 oz)  11/27/14 74.753 kg (164 lb 12.8 oz)    General:  Appears calm and comfortable Eyes: PERRL, normal lids, irises & conjunctiva ENT: grossly normal hearing, lips & tongue Neck: no LAD, masses or thyromegaly Cardiovascular: RRR, no m/r/g. No LE edema Respiratory: CTA bilaterally, no w/r/r. Normal respiratory effort. Abdomen: soft, ntnd Skin: no rash or induration seen on limited exam Musculoskeletal: grossly normal tone BUE/BLE Psychiatric: grossly normal mood and affect Neurologic: grossly non-focal.          Labs on Admission:  Basic Metabolic Panel:  Recent Labs Lab 12/18/14 1315 12/19/14 1747  NA 135 136  K 3.8 3.5  CL 97* 94*  CO2 27 31  GLUCOSE 154* 106*  BUN 15 16  CREATININE 1.20*  1.22*  CALCIUM 9.1 9.5   Liver Function Tests:  Recent Labs Lab 12/19/14 1747  AST 38  ALT 34  ALKPHOS 57  BILITOT 0.5  PROT 7.8  ALBUMIN 3.6   No results for input(s): LIPASE, AMYLASE in the last 168 hours. No results for input(s): AMMONIA in the last 168 hours. CBC:  Recent Labs Lab 12/18/14 1315 12/19/14 1747  WBC 6.0 7.8  NEUTROABS  --  5.0  HGB 12.1 12.5  HCT 37.2 38.4  MCV 91.2 91.2  PLT 202 232   Cardiac Enzymes: No results for input(s): CKTOTAL, CKMB, CKMBINDEX, TROPONINI in the last 168 hours.  BNP (last 3 results)  Recent Labs  08/16/14 0855 08/19/14 1523 10/11/14 1459  BNP 1169.9* 1627.6* 2298.4*    ProBNP (last 3 results)  Recent Labs  09/25/14 1332  PROBNP 2219.0*    CBG: No results for input(s): GLUCAP in the last 168 hours.  Radiological Exams on Admission: Dg Chest 2 View  12/19/2014  CLINICAL DATA:  Fever 5 days EXAM: CHEST  2 VIEW COMPARISON:  12/16/2014 FINDINGS: There is elevation of the right diaphragm. There is discoid left basilar atelectasis. There is no focal parenchymal opacity. There is no pleural effusion or pneumothorax. There is cardiomegaly. There is a right-sided PICC line with the tip projecting over the SVC. The osseous structures are unremarkable. IMPRESSION: No active cardiopulmonary disease. Electronically Signed   By: Kathreen Devoid   On: 12/19/2014 18:04      Assessment/Plan Principal Problem:   Fever Active Problems:   Essential (primary) hypertension   CKD (chronic kidney disease) stage 3, GFR 30-59 ml/min   Hypothyroidism   Chronic systolic CHF (congestive heart failure), NYHA class 4 ([place])   1. Fever -initially felt to be due to PICC line however ED discussed with Cards and are  going to leave the line in for now -will go ahead and start on vancocin however due to the prior cultures obtained (these were gr+ rods may be contaminant) -repeat cultures have been ordered -will also get c diff assay due to  diarrhea  2. HTN -will monitor pressures -will continue with home meds  3. CKD III -will monitor labs  4. CHF Stage 4 -currently appears to be compensated -will continue with milrinone drip and diuresis as ordered -continue with digoxin  5. Hypothyroid -will continue with synthroid -check TSH    Code Status: full code DVT Prophylaxis:heparin Family Communication: none Disposition Plan: home    [facility] Pager [phone]

## 2014-12-19 NOTE — ED Notes (Signed)
Pt in x-ray from triage, brought pt's family back

## 2014-12-19 NOTE — Telephone Encounter (Signed)
Call received from [place] in the CHF clinic this afternoon. She reports the patient's blood cultures are coming back positive for Gram + rods. The patient has a PICC line for Milrinone - per Nira Conn, the patient has been advised to come to the ER for treatment. She is scheduled for a Bi-V PPM implant on 12/25/14. Reviewed the situation with Dr. Lovena Le in Dr. Olin Pia absence- per Dr. Lovena Le, cancel Bi-V implant for 12/25/14. Will forward the message to Dr. Caryl Comes so he is aware when he returns next week.

## 2014-12-19 NOTE — Patient Outreach (Signed)
HIGH RISK PATIENT - Called for [facility] screening. Pt was screened earlier this fall by Thora Lance, RN, [facility] care manager. I called and talked with pt and she said she was already receiving services from [place] and she did not care to talk to me further. She did refer me to call another time when one of her children could be present and talk to them. I asked when that might be and she could not tell me. Her children are listed as her emergency contacts so I called her son, Tenaya Kraynik and left him a message to call me.  In reviewing her chart it does appear that Pam continues to be active with this pt. I will collaborate with her to ensure one of [place] has ongoing contact with her.  [name] [facility] [place] [phone]

## 2014-12-20 DIAGNOSIS — N183 Chronic kidney disease, stage 3 (moderate): Secondary | ICD-10-CM

## 2014-12-20 DIAGNOSIS — R7881 Bacteremia: Secondary | ICD-10-CM | POA: Insufficient documentation

## 2014-12-20 LAB — CBC
HCT: 34.3 % — ABNORMAL LOW (ref 36.0–46.0)
HEMOGLOBIN: 10.9 g/dL — AB (ref 12.0–15.0)
MCH: 28.9 pg (ref 26.0–34.0)
MCHC: 31.8 g/dL (ref 30.0–36.0)
MCV: 91 fL (ref 78.0–100.0)
PLATELETS: 216 10*3/uL (ref 150–400)
RBC: 3.77 MIL/uL — AB (ref 3.87–5.11)
RDW: 16.7 % — ABNORMAL HIGH (ref 11.5–15.5)
WBC: 7.6 10*3/uL (ref 4.0–10.5)

## 2014-12-20 LAB — COMPREHENSIVE METABOLIC PANEL
ALK PHOS: 45 U/L (ref 38–126)
ALT: 26 U/L (ref 14–54)
AST: 30 U/L (ref 15–41)
Albumin: 3 g/dL — ABNORMAL LOW (ref 3.5–5.0)
Anion gap: 11 (ref 5–15)
BUN: 12 mg/dL (ref 6–20)
CALCIUM: 9 mg/dL (ref 8.9–10.3)
CO2: 29 mmol/L (ref 22–32)
CREATININE: 1.1 mg/dL — AB (ref 0.44–1.00)
Chloride: 96 mmol/L — ABNORMAL LOW (ref 101–111)
GFR, EST AFRICAN AMERICAN: 52 mL/min — AB (ref >60)
GFR, EST NON AFRICAN AMERICAN: 44 mL/min — AB (ref >60)
Glucose, Bld: 111 mg/dL — ABNORMAL HIGH (ref 65–99)
Potassium: 3.5 mmol/L (ref 3.5–5.1)
Sodium: 136 mmol/L (ref 135–145)
TOTAL PROTEIN: 6.7 g/dL (ref 6.5–8.1)
Total Bilirubin: 0.6 mg/dL (ref 0.3–1.2)

## 2014-12-20 LAB — URINE CULTURE

## 2014-12-20 LAB — TSH: TSH: 0.95 u[IU]/mL (ref 0.350–4.500)

## 2014-12-20 LAB — C DIFFICILE QUICK SCREEN W PCR REFLEX
C DIFFICILE (CDIFF) INTERP: NEGATIVE
C Diff antigen: NEGATIVE
C Diff toxin: NEGATIVE

## 2014-12-20 LAB — MRSA PCR SCREENING: MRSA by PCR: POSITIVE — AB

## 2014-12-20 LAB — PROTIME-INR
INR: 3.86 — AB (ref 0.00–1.49)
PROTHROMBIN TIME: 37 s — AB (ref 11.6–15.2)

## 2014-12-20 MED ORDER — RIVAROXABAN 20 MG PO TABS
20.0000 mg | ORAL_TABLET | Freq: Every day | ORAL | Status: DC
  Administered 2014-12-21 – 2014-12-24 (×4): 20 mg via ORAL
  Filled 2014-12-20 (×4): qty 1

## 2014-12-20 MED ORDER — SACCHAROMYCES BOULARDII 250 MG PO CAPS
250.0000 mg | ORAL_CAPSULE | Freq: Two times a day (BID) | ORAL | Status: DC
  Administered 2014-12-20 – 2014-12-24 (×9): 250 mg via ORAL
  Filled 2014-12-20 (×9): qty 1

## 2014-12-20 MED ORDER — DIPHENOXYLATE-ATROPINE 2.5-0.025 MG PO TABS: 1.0000 | ORAL_TABLET | Freq: Four times a day (QID) | ORAL | Status: DC | PRN

## 2014-12-20 MED ORDER — PIPERACILLIN-TAZOBACTAM 3.375 G IVPB 30 MIN
3.3750 g | Freq: Once | INTRAVENOUS | Status: AC
  Administered 2014-12-20: 3.375 g via INTRAVENOUS
  Filled 2014-12-20: qty 50

## 2014-12-20 MED ORDER — PIPERACILLIN-TAZOBACTAM 3.375 G IVPB
3.3750 g | Freq: Three times a day (TID) | INTRAVENOUS | Status: DC
  Administered 2014-12-20 – 2014-12-22 (×6): 3.375 g via INTRAVENOUS
  Filled 2014-12-20 (×9): qty 50

## 2014-12-20 MED ORDER — ENSURE ENLIVE PO LIQD
237.0000 mL | Freq: Two times a day (BID) | ORAL | Status: DC
  Administered 2014-12-20 – 2014-12-23 (×5): 237 mL via ORAL

## 2014-12-20 NOTE — Progress Notes (Signed)
Advanced Heart Failure Rounding Note   Subjective:     Febrile up to 101 again today.  UA with mild amount of leuks. Otherwise negative   Objective:   Weight Range:  Vital Signs:   Temp:  [98.2 F (36.8 C)-101 F (38.3 C)] 98.2 F (36.8 C) (12/02 0700) Pulse Rate:  [95-116] 95 (12/02 0647) Resp:  [11-22] 14 (12/02 0647) BP: (100-128)/(52-69) 118/53 mmHg (12/02 0647) SpO2:  [91 %-100 %] 95 % (12/02 0647) Weight:  [73.9 kg (162 lb 14.7 oz)-74.39 kg (164 lb)] 73.9 kg (162 lb 14.7 oz) (12/02 0407) Last BM Date: 12/19/14  Weight change: Filed Weights   12/19/14 1708 12/19/14 2258 12/20/14 0407  Weight: 74.39 kg (164 lb) 73.9 kg (162 lb 14.7 oz) 73.9 kg (162 lb 14.7 oz)    Intake/Output:   Intake/Output Summary (Last 24 hours) at 12/20/14 0845 Last data filed at 12/20/14 0700  Gross per 24 hour  Intake  14.69 ml  Output      0 ml  Net  14.69 ml     Physical Exam: General: In bed NAD HEENT: Normal, without mass or lesion. Neck: Supple, no bruits. JVP 6-7 cm. Carotids 2+. No thyromegaly or nodule. Heart: PMI nondisplaced tachy, no s4. +S3, no murmur. Lungs: Slightly diminished R base Abdomen: Soft, NT, ND, no HSM +BS x 4.  Extremities: No clubbing, cyanosis. Trace ankle edema. RUE picc looks fine. No exudate or erythema Neuro: Alert and oriented X 3. Moves all extremities spontaneously.  Telemetry: sinus tach with LBBB  Labs: Basic Metabolic Panel:  Recent Labs Lab 12/18/14 1315 12/19/14 1747 12/20/14 0431  NA 135 136 136  K 3.8 3.5 3.5  CL 97* 94* 96*  CO2 27 31 29   GLUCOSE 154* 106* 111*  BUN 15 16 12   CREATININE 1.20* 1.22* 1.10*  CALCIUM 9.1 9.5 9.0    Liver Function Tests:  Recent Labs Lab 12/19/14 1747 12/20/14 0431  AST 38 30  ALT 34 26  ALKPHOS 57 45  BILITOT 0.5 0.6  PROT 7.8 6.7  ALBUMIN 3.6 3.0*   No results for input(s): LIPASE, AMYLASE in the last 168 hours. No results for input(s): AMMONIA in the last 168  hours.  CBC:  Recent Labs Lab 12/18/14 1315 12/19/14 1747 12/20/14 0431  WBC 6.0 7.8 7.6  NEUTROABS  --  5.0  --   HGB 12.1 12.5 10.9*  HCT 37.2 38.4 34.3*  MCV 91.2 91.2 91.0  PLT 202 232 216    Cardiac Enzymes: No results for input(s): CKTOTAL, CKMB, CKMBINDEX, TROPONINI in the last 168 hours.  BNP: BNP (last 3 results)  Recent Labs  08/16/14 0855 08/19/14 1523 10/11/14 1459  BNP 1169.9* 1627.6* 2298.4*    ProBNP (last 3 results)  Recent Labs  09/25/14 1332  PROBNP 2219.0*      Other results:  Imaging: Dg Chest 2 View  12/19/2014  CLINICAL DATA:  Fever 5 days EXAM: CHEST  2 VIEW COMPARISON:  12/16/2014 FINDINGS: There is elevation of the right diaphragm. There is discoid left basilar atelectasis. There is no focal parenchymal opacity. There is no pleural effusion or pneumothorax. There is cardiomegaly. There is a right-sided PICC line with the tip projecting over the SVC. The osseous structures are unremarkable. IMPRESSION: No active cardiopulmonary disease. Electronically Signed   By: Kathreen Devoid   On: 12/19/2014 18:04      Medications:     Scheduled Medications: . B-complex with vitamin C  1 tablet  Oral QPM  . calcium carbonate  1 tablet Oral BID WC  . cholecalciferol  1,000 Units Oral Daily  . citalopram  20 mg Oral Daily  . digoxin  0.0625 mg Oral Daily  . furosemide  40 mg Oral Daily  . lactose free nutrition  237 mL Oral Q breakfast  . levothyroxine  25 mcg Oral QAC breakfast  . multivitamin with minerals  1 tablet Oral Daily  . pantoprazole  40 mg Oral BID  . piperacillin-tazobactam  3.375 g Intravenous Once  . piperacillin-tazobactam (ZOSYN)  IV  3.375 g Intravenous Q8H  . potassium chloride  10 mEq Oral Daily  . rivaroxaban  20 mg Oral Q supper  . sodium chloride  3 mL Intravenous Q12H  . sodium chloride  3 mL Intravenous Q12H  . spironolactone  25 mg Oral Daily  . vancomycin  1,000 mg Intravenous Q24H     Infusions: . milrinone  0.125 mcg/kg/min (12/20/14 0156)     PRN Medications:  sodium chloride, acetaminophen **OR** acetaminophen, albuterol, ALPRAZolam, calcium carbonate, nitroGLYCERIN, polyethylene glycol, promethazine, sodium chloride, sodium chloride, traMADol   Assessment:   1. Fever with Bcx 1/2 Gram positive rods 2. Chronic Systolic Heart failure - NYHA Class IV Echo 10/02/14 EF 15%   -requiring milrinone 3. Recent PE/ DVT bilateral on Xarelto 4. CKD Stage 3  5. Hx of LBBB 6. Intolerance of amiodarone due to possible acute lung toxicity  Plan/Discussion:    She has had persistent fevers on unclear source. Now with bcx 1/1 GP rods. Concern obviously for bacteremia related to PICC line but GP rods are usually diphtheroids which is skin contaminant. Bcx have been drawn in ER and will start vancomycin until we have speciation back on first set of cultures and we get results of f/u cx. UA borderline.  Check stool for C.diff. Would not pull PICC line yet. Continue milrinone. Would ask ID to see to help guide management,    Length of Stay: 1   Bensimhon, Daniel MD 12/20/2014, 8:45 AM  Advanced Heart Failure Team Pager [phone] (M-F; [place])  Please contact [facility] for night-coverage after hours (4p -7a ) and weekends on [email or web address]

## 2014-12-20 NOTE — Progress Notes (Signed)
Initial Nutrition Assessment  DOCUMENTATION CODES:   Severe malnutrition in context of chronic illness  INTERVENTION:    Ensure Enlive PO BID, each supplement provides 350 kcal and 20 grams of protein  NUTRITION DIAGNOSIS:   Malnutrition related to chronic illness as evidenced by energy intake < or equal to 75% for > or equal to 1 month, percent weight loss (17% weight loss x 6 months).  GOAL:   Patient will meet greater than or equal to 90% of their needs  MONITOR:   PO intake, Supplement acceptance, Labs, Weight trends  REASON FOR ASSESSMENT:   Malnutrition Screening Tool    ASSESSMENT:   81 y.o. female with prior history of CHF CKD HTN HLD Asthma Obesity presents with fevers and positive blood cultures. Patient has been followed in the heart failure clinic and was noted over the last week or so to be having fevers.  Nutrition-Focused physical exam completed. Findings are no fat depletion, mild-moderate muscle depletion, and no edema. Patient reports usual weight in the 200's. She recently had to start taking Lasix to remove some fluids, now weight down to 162 lbs. Suspect some weight loss from fluids and some from muscle loss. She has been eating very poorly over the past several months. She likes chocolate Boost, agreed to try chocolate Ensure Enlive.   Diet Order:  Diet heart healthy/carb modified Room service appropriate?: Yes; Fluid consistency:: Thin  Skin:  Reviewed, no issues  Last BM:  12/1  Height:   Ht Readings from Last 1 Encounters:  12/19/14 5\' 4"  (1.626 m)    Weight:   Wt Readings from Last 1 Encounters:  12/20/14 162 lb 14.7 oz (73.9 kg)    Ideal Body Weight:  54.5 kg  BMI:  Body mass index is 27.95 kg/(m^2).  Estimated Nutritional Needs:   Kcal:  1650-1850  Protein:  100-120 gm  Fluid:  1.7-1.9 L  EDUCATION NEEDS:   No education needs identified at this time  Molli Barrows, [place], [place], [place] Pager [phone] After Hours Pager  [phone]

## 2014-12-20 NOTE — Progress Notes (Signed)
UR COMPLETED  

## 2014-12-20 NOTE — Progress Notes (Signed)
[facility] PROGRESS NOTE  Alexandra [name] [id number] DOB: 11-May-1927 DOA: 12/19/2014 PCP:  Melinda Crutch, MD  Brief Summary  Alexandra [name] is a 81 y.o. 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 Patient has been followed in the heart failure clinic and was noted over the last week or so to be having fevers. The temperatures were low grade to about 100F so she had blood cultures drawn by her home health agency on 11/29.  One of two cultures grew possible gram positive rods.  She was called at home and advised to come to the hospital for further evaluation/concern for PICC line infection.    Assessment/Plan  Fever, possibly secondary to bacteremia or UTI -  Continue PICC line as long as patient is hemodynamically at her baseline for now pending further culture data -  F/u urine culture -  F/u [facility] labs BCx from 11/29 -  F/u in house BCx -  Continue vancomycin -  Add zosyn in case gram stain was inaccurate  -  Add florastor  Chronic systolic heart failure, NICM NYHA IV, on milrinone infusion -  Continue milrinone and digoxin -  Continue spironolactone and once daily oral lasix -  Daily weights and strict I/O  CKD stage 3, creatinine stable near 1.1.-1.2  Anemia of chronic disease, hemoglobin stable  Hx of DVT/PE, stable on xarelto  Chronic diarrhea since cholecystectomy -  C. Diff negative -  Start lomotil prn  Hypothyroidism, stable, continue synthroid  Depression/anxiety, currently anxious.  Continue prn xanax and citalopram  Diet:  Low sodium Access:  Right arm PICC line IVF:  off Proph:  xarelto  Code Status: full Family Communication: patient and her son, answered many questions Disposition Plan: pending results of culture data   Consultants:  Heart failure, Dr.  Haroldine Laws  Procedures:  none  Antibiotics:  Vancomycin 12/1 >  Zosyn 12/2 >   HPI/Subjective:  States she had one looser than normal stool this morning.  She has had fevers and chills at home with a mild cough, but nonproductive.  Denies dysuria, frequency, urgency, or pain at her PICC site.  PICC has been working fine.      Objective: Filed Vitals:   12/20/14 0647 12/20/14 0700 12/20/14 1243 12/20/14 1600  BP: 118/53     Pulse: 95 93    Temp:  98.2 F (36.8 C) 98 F (36.7 C) 98.2 F (36.8 C)  TempSrc:  Oral Oral Oral  Resp: 14 11    Height:      Weight:      SpO2: 95% 96%      Intake/Output Summary (Last 24 hours) at 12/20/14 1810 Last data filed at 12/20/14 0800  Gross per 24 hour  Intake  17.59 ml  Output      0 ml  Net  17.59 ml   Filed Weights   12/19/14 1708 12/19/14 2258 12/20/14 0407  Weight: 74.39 kg (164 lb) 73.9 kg (162 lb 14.7 oz) 73.9 kg (162 lb 14.7 oz)   Body mass index is 27.95 kg/(m^2).  Exam:   General:  Adult female, No acute distress  HEENT:  NCAT, MMM  Cardiovascular:  RRR, nl S1, S2 no mrg, 2+ pulses, warm extremities  Respiratory:  CTAB, no increased WOB  Abdomen:   Hyperactive BS, soft, NT/ND  MSK:  Normal tone and bulk, no LEE  Neuro:  Grossly intact  Derm:  PICC line site appears c/d/i, no erythema or purulence  Data Reviewed: Basic Metabolic Panel:  Recent Labs Lab 12/18/14 1315 12/19/14 1747 12/20/14 0431  NA 135 136 136  K 3.8 3.5 3.5  CL 97* 94* 96*  CO2 27 31 29   GLUCOSE 154* 106* 111*  BUN 15 16 12   CREATININE 1.20* 1.22* 1.10*  CALCIUM 9.1 9.5 9.0   Liver Function Tests:  Recent Labs Lab 12/19/14 1747 12/20/14 0431  AST 38 30  ALT 34 26  ALKPHOS 57 45  BILITOT 0.5 0.6  PROT 7.8 6.7  ALBUMIN 3.6 3.0*   No results for input(s): LIPASE, AMYLASE in the last 168 hours. No results for input(s): AMMONIA in the last 168 hours. CBC:  Recent Labs Lab 12/18/14 1315 12/19/14 1747 12/20/14 0431   WBC 6.0 7.8 7.6  NEUTROABS  --  5.0  --   HGB 12.1 12.5 10.9*  HCT 37.2 38.4 34.3*  MCV 91.2 91.2 91.0  PLT 202 232 216    Recent Results (from the past 240 hour(s))  Culture, blood (single)     Status: None (Preliminary result)   Collection Time: 12/18/14  1:10 PM  Result Value Ref Range Status   Specimen Description BLOOD LEFT ARM  Final   Special Requests BOTTLES DRAWN AEROBIC AND ANAEROBIC 5CC  Final   Culture NO GROWTH 2 DAYS  Final   Report Status PENDING  Incomplete  Culture, blood (routine x 2)     Status: None (Preliminary result)   Collection Time: 12/19/14  5:22 PM  Result Value Ref Range Status   Specimen Description BLOOD LEFT ARM  Final   Special Requests BOTTLES DRAWN AEROBIC AND ANAEROBIC 5CC  Final   Culture NO GROWTH < 24 HOURS  Final   Report Status PENDING  Incomplete  Culture, blood (routine x 2)     Status: None (Preliminary result)   Collection Time: 12/19/14  7:07 PM  Result Value Ref Range Status   Specimen Description BLOOD LEFT ARM  Final   Special Requests BOTTLES DRAWN AEROBIC AND ANAEROBIC 5CC  Final   Culture NO GROWTH < 24 HOURS  Final   Report Status PENDING  Incomplete  Urine culture     Status: None   Collection Time: 12/19/14  8:15 PM  Result Value Ref Range Status   Specimen Description URINE, CLEAN CATCH  Final   Special Requests NONE  Final   Culture 1,000 COLONIES/mL INSIGNIFICANT GROWTH  Final   Report Status 12/20/2014 FINAL  Final  MRSA PCR Screening     Status: Abnormal   Collection Time: 12/19/14 11:03 PM  Result Value Ref Range Status   MRSA by PCR POSITIVE (A) NEGATIVE Final    Comment:        The GeneXpert MRSA Assay (FDA approved for NASAL specimens only), is one component of a comprehensive MRSA colonization surveillance program. It is not intended to diagnose MRSA infection nor to guide or monitor treatment for MRSA infections. RESULT CALLED TO, READ BACK BY AND VERIFIED WITH: R BROWN @0047  12/20/14 MKELLY   C  difficile quick scan w PCR reflex     Status: None   Collection Time: 12/20/14 11:32 AM  Result Value Ref Range Status   C Diff antigen NEGATIVE NEGATIVE Final   C Diff toxin NEGATIVE NEGATIVE Final   C Diff interpretation Negative for toxigenic C. difficile  Final  Studies: Dg Chest 2 View  12/19/2014  CLINICAL DATA:  Fever 5 days EXAM: CHEST  2 VIEW COMPARISON:  12/16/2014 FINDINGS: There is elevation of the right diaphragm. There is discoid left basilar atelectasis. There is no focal parenchymal opacity. There is no pleural effusion or pneumothorax. There is cardiomegaly. There is a right-sided PICC line with the tip projecting over the SVC. The osseous structures are unremarkable. IMPRESSION: No active cardiopulmonary disease. Electronically Signed   By: Kathreen Devoid   On: 12/19/2014 18:04    Scheduled Meds: . B-complex with vitamin C  1 tablet Oral QPM  . calcium carbonate  1 tablet Oral BID WC  . cholecalciferol  1,000 Units Oral Daily  . citalopram  20 mg Oral Daily  . digoxin  0.0625 mg Oral Daily  . feeding supplement (ENSURE ENLIVE)  237 mL Oral BID BM  . furosemide  40 mg Oral Daily  . levothyroxine  25 mcg Oral QAC breakfast  . multivitamin with minerals  1 tablet Oral Daily  . pantoprazole  40 mg Oral BID  . piperacillin-tazobactam (ZOSYN)  IV  3.375 g Intravenous Q8H  . potassium chloride  10 mEq Oral Daily  . rivaroxaban  20 mg Oral Q supper  . saccharomyces boulardii  250 mg Oral BID  . sodium chloride  3 mL Intravenous Q12H  . sodium chloride  3 mL Intravenous Q12H  . spironolactone  25 mg Oral Daily  . vancomycin  1,000 mg Intravenous Q24H   Continuous Infusions: . milrinone 0.125 mcg/kg/min (12/20/14 0156)    Principal Problem:   Fever Active Problems:   Essential (primary) hypertension   CKD (chronic kidney disease) stage 3, GFR 30-59 ml/min   Hypothyroidism   Chronic systolic CHF (congestive heart failure), NYHA class 4 ([place])    Time spent: 30  min    Thamar Holik, [facility] Pager [phone]. If 7PM-7AM, please contact night-coverage at [email or web address], password [facility] 12/20/2014, 6:10 PM  LOS: 1 day

## 2014-12-21 DIAGNOSIS — I10 Essential (primary) hypertension: Secondary | ICD-10-CM

## 2014-12-21 LAB — CBC
HEMATOCRIT: 33.4 % — AB (ref 36.0–46.0)
Hemoglobin: 10.9 g/dL — ABNORMAL LOW (ref 12.0–15.0)
MCH: 29.4 pg (ref 26.0–34.0)
MCHC: 32.6 g/dL (ref 30.0–36.0)
MCV: 90 fL (ref 78.0–100.0)
Platelets: 222 10*3/uL (ref 150–400)
RBC: 3.71 MIL/uL — ABNORMAL LOW (ref 3.87–5.11)
RDW: 16.8 % — AB (ref 11.5–15.5)
WBC: 8.9 10*3/uL (ref 4.0–10.5)

## 2014-12-21 LAB — BASIC METABOLIC PANEL
ANION GAP: 10 (ref 5–15)
BUN: 16 mg/dL (ref 6–20)
CALCIUM: 8.8 mg/dL — AB (ref 8.9–10.3)
CO2: 30 mmol/L (ref 22–32)
Chloride: 95 mmol/L — ABNORMAL LOW (ref 101–111)
Creatinine, Ser: 1.28 mg/dL — ABNORMAL HIGH (ref 0.44–1.00)
GFR calc Af Amer: 43 mL/min — ABNORMAL LOW (ref >60)
GFR, EST NON AFRICAN AMERICAN: 37 mL/min — AB (ref >60)
GLUCOSE: 112 mg/dL — AB (ref 65–99)
POTASSIUM: 3.4 mmol/L — AB (ref 3.5–5.1)
SODIUM: 135 mmol/L (ref 135–145)

## 2014-12-21 LAB — HEMOGLOBIN A1C
HEMOGLOBIN A1C: 6.4 % — AB (ref 4.8–5.6)
MEAN PLASMA GLUCOSE: 137 mg/dL

## 2014-12-21 MED ORDER — POTASSIUM CHLORIDE ER 10 MEQ PO TBCR
20.0000 meq | EXTENDED_RELEASE_TABLET | Freq: Every day | ORAL | Status: DC
  Administered 2014-12-21: 20 meq via ORAL
  Filled 2014-12-21 (×3): qty 2

## 2014-12-21 MED ORDER — IVABRADINE HCL 5 MG PO TABS
2.5000 mg | ORAL_TABLET | Freq: Two times a day (BID) | ORAL | Status: DC
  Administered 2014-12-21 – 2014-12-22 (×3): 2.5 mg via ORAL
  Filled 2014-12-21 (×6): qty 1

## 2014-12-21 NOTE — Progress Notes (Signed)
Called 3 east to give report , RN unable to take report at this time. Left my name and number for call back. Consuelo Pandy RN

## 2014-12-21 NOTE — Progress Notes (Signed)
Advanced Heart Failure Rounding Note   Subjective:     Tmax 99.6 this am. Ucx negative. F/u Bcx negative. On vanc and zosyn. Denies dyspnea. + fatigue.    Objective:   Weight Range:  Vital Signs:   Temp:  [98 F (36.7 C)-99.6 F (37.6 C)] 98 F (36.7 C) (12/03 0723) Pulse Rate:  [87-119] 97 (12/03 0723) Resp:  [13-20] 18 (12/03 0723) BP: (106-116)/(54-73) 111/62 mmHg (12/03 0723) SpO2:  [92 %-98 %] 96 % (12/03 0723) Weight:  [73 kg (160 lb 15 oz)] 73 kg (160 lb 15 oz) (12/03 0520) Last BM Date: 12/20/14  Weight change: Filed Weights   12/19/14 2258 12/20/14 0407 12/21/14 0520  Weight: 73.9 kg (162 lb 14.7 oz) 73.9 kg (162 lb 14.7 oz) 73 kg (160 lb 15 oz)    Intake/Output:   Intake/Output Summary (Last 24 hours) at 12/21/14 1011 Last data filed at 12/21/14 0800  Gross per 24 hour  Intake  599.6 ml  Output    475 ml  Net  124.6 ml     Physical Exam: General: In bed NAD HEENT: Normal, without mass or lesion. Neck: Supple, no bruits. JVP 6-7 cm. Carotids 2+. No thyromegaly or nodule. Heart: PMI nondisplaced tachy, no s4. +S3, no murmur. Lungs: Slightly diminished R base Abdomen: Soft, NT, ND, no HSM +BS x 4.  Extremities: No clubbing, cyanosis. Trace ankle edema. RUE picc looks fine. No exudate or erythema Neuro: Alert and oriented X 3. Moves all extremities spontaneously.  Telemetry: sinus tach with LBBB  Labs: Basic Metabolic Panel:  Recent Labs Lab 12/18/14 1315 12/19/14 1747 12/20/14 0431 12/21/14 0525  NA 135 136 136 135  K 3.8 3.5 3.5 3.4*  CL 97* 94* 96* 95*  CO2 27 31 29 30   GLUCOSE 154* 106* 111* 112*  BUN 15 16 12 16   CREATININE 1.20* 1.22* 1.10* 1.28*  CALCIUM 9.1 9.5 9.0 8.8*    Liver Function Tests:  Recent Labs Lab 12/19/14 1747 12/20/14 0431  AST 38 30  ALT 34 26  ALKPHOS 57 45  BILITOT 0.5 0.6  PROT 7.8 6.7  ALBUMIN 3.6 3.0*   No results for input(s): LIPASE, AMYLASE in the last 168 hours. No results for  input(s): AMMONIA in the last 168 hours.  CBC:  Recent Labs Lab 12/18/14 1315 12/19/14 1747 12/20/14 0431 12/21/14 0525  WBC 6.0 7.8 7.6 8.9  NEUTROABS  --  5.0  --   --   HGB 12.1 12.5 10.9* 10.9*  HCT 37.2 38.4 34.3* 33.4*  MCV 91.2 91.2 91.0 90.0  PLT 202 232 216 222    Cardiac Enzymes: No results for input(s): CKTOTAL, CKMB, CKMBINDEX, TROPONINI in the last 168 hours.  BNP: BNP (last 3 results)  Recent Labs  08/16/14 0855 08/19/14 1523 10/11/14 1459  BNP 1169.9* 1627.6* 2298.4*    ProBNP (last 3 results)  Recent Labs  09/25/14 1332  PROBNP 2219.0*      Other results:  Imaging: Dg Chest 2 View  12/19/2014  CLINICAL DATA:  Fever 5 days EXAM: CHEST  2 VIEW COMPARISON:  12/16/2014 FINDINGS: There is elevation of the right diaphragm. There is discoid left basilar atelectasis. There is no focal parenchymal opacity. There is no pleural effusion or pneumothorax. There is cardiomegaly. There is a right-sided PICC line with the tip projecting over the SVC. The osseous structures are unremarkable. IMPRESSION: No active cardiopulmonary disease. Electronically Signed   By: Kathreen Devoid   On: 12/19/2014 18:04  Medications:     Scheduled Medications: . B-complex with vitamin C  1 tablet Oral QPM  . calcium carbonate  1 tablet Oral BID WC  . cholecalciferol  1,000 Units Oral Daily  . citalopram  20 mg Oral Daily  . digoxin  0.0625 mg Oral Daily  . feeding supplement (ENSURE ENLIVE)  237 mL Oral BID BM  . furosemide  40 mg Oral Daily  . levothyroxine  25 mcg Oral QAC breakfast  . multivitamin with minerals  1 tablet Oral Daily  . pantoprazole  40 mg Oral BID  . piperacillin-tazobactam (ZOSYN)  IV  3.375 g Intravenous Q8H  . potassium chloride  20 mEq Oral Daily  . rivaroxaban  20 mg Oral Q supper  . saccharomyces boulardii  250 mg Oral BID  . sodium chloride  3 mL Intravenous Q12H  . sodium chloride  3 mL Intravenous Q12H  . spironolactone  25 mg Oral Daily   . vancomycin  1,000 mg Intravenous Q24H    Infusions: . milrinone 0.125 mcg/kg/min (12/21/14 0800)    PRN Medications: sodium chloride, acetaminophen **OR** acetaminophen, albuterol, ALPRAZolam, calcium carbonate, diphenoxylate-atropine, nitroGLYCERIN, polyethylene glycol, promethazine, sodium chloride, sodium chloride, traMADol   Assessment:   1. Fever with Bcx 1/2 Gram positive rods 2. Chronic Systolic Heart failure - NYHA Class IV Echo 10/02/14 EF 15%   -requiring milrinone 3. Recent PE/ DVT bilateral on Xarelto 4. CKD Stage 3  5. Hx of LBBB 6. Intolerance of amiodarone due to possible acute lung toxicity 7. UTI 8. Hypokalemia  Plan/Discussion:    She has had persistent fevers on unclear source. Now with bcx 1/1 GP rods on outside cx. Concern obviously for bacteremia related to PICC line but GP rods are usually diphtheroids which is skin contaminant. UA mildly positive. Now on vanc/zosyn. F/u cultures negative. Primary team managing (thank you)   HF stable. PICC line still in. Continue milrinone. Given tachycardia will try low-dose [place]. Supp K+.  Length of Stay: 2   Glori Bickers MD 12/21/2014, 10:11 AM  Advanced Heart Failure Team Pager [phone] (M-F; [place])  Please contact [facility] for night-coverage after hours (4p -7a ) and weekends on [email or web address]

## 2014-12-21 NOTE — Progress Notes (Addendum)
[facility] PROGRESS NOTE  Alexandra [name] [id number] DOB: [birth year] DOA: 12/19/2014 PCP:  Melinda Crutch, MD  Brief Summary  Alexandra [name] is a 81 y.o. 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 Patient has been followed in the heart failure clinic and was noted over the last week or so to be having fevers. The temperatures were low grade to about 100F so she had blood cultures drawn by her home health agency on 11/29.  One of two cultures grew possible gram positive rods.  She was called at home and advised to come to the hospital for further evaluation/concern for PICC line infection.    Assessment/Plan  Fever, possibly secondary to bacteremia or viral illness.  Trending down rapidly with antibiotics -  Continue PICC line as long as patient is hemodynamically at her baseline for now pending further culture data -  Urine culture neg -  F/u [facility] BCx from 11/29:  Peripheral culture contaminated.  One small colony growing today from PICC line -  BCx from our lab:  NGTD -  Continue vancomycin -  Continue zosyn in case gram stain was inaccurate  -  Add florastor  Chronic systolic heart failure, NICM NYHA IV, on milrinone infusion -  Continue milrinone and digoxin -  Continue spironolactone and once daily oral lasix -  Wt stable, I/O not strictly recorded -  corlanor added 12/3 for persistent tachycardia  CKD stage 3, creatinine stable near 1.1.-1.2  Anemia of chronic disease, hemoglobin stable  Hx of DVT/PE, stable on xarelto  Chronic diarrhea since cholecystectomy -  C. Diff negative -  Continue lomotil prn  Hypothyroidism, stable, continue synthroid  Depression/anxiety, currently anxious.  Continue prn xanax and citalopram  Diet:  Low sodium Access:  Right arm PICC  line IVF:  off Proph:  xarelto  Code Status: full Family Communication: patient and her son, answered many questions Disposition Plan: pending results of culture data   Consultants:  Heart failure, Dr. Haroldine Laws  Procedures:  none  Antibiotics:  Vancomycin 12/1 >  Zosyn 12/2 >   HPI/Subjective:  Fevers and chills are improving.  Denies dysuria, frequency, urgency, or pain at her PICC site.  PICC has been working fine.      Objective: Filed Vitals:   12/21/14 1203 12/21/14 1211 12/21/14 1600 12/21/14 1643  BP: 108/56   113/52  Pulse:    99  Temp:  97.9 F (36.6 C) 99.1 F (37.3 C) 98 F (36.7 C)  TempSrc:  Oral Oral Oral  Resp: 18   20  Height:    5\' 4"  (1.626 m)  Weight:    72.3 kg (159 lb 6.3 oz)  SpO2:    96%    Intake/Output Summary (Last 24 hours) at 12/21/14 1833 Last data filed at 12/21/14 1502  Gross per 24 hour  Intake   1200 ml  Output   1127 ml  Net     73 ml   Filed Weights   12/20/14 0407 12/21/14 0520 12/21/14 1643  Weight: 73.9 kg (162 lb 14.7 oz) 73 kg (160 lb 15 oz) 72.3 kg (159 lb 6.3 oz)   Body mass index is 27.35 kg/(m^2).  Exam:   General:  Adult female, No acute distress  HEENT:  NCAT, MMM  Cardiovascular:  RRR, nl S1, S2 no mrg, 2+  pulses, warm extremities  Respiratory:  CTAB, no increased WOB  Abdomen:   NABS, soft, NT/ND  MSK:   Normal tone and bulk, no LEE  Derm:  PICC line site appears c/d/i, no erythema or purulence again today  Data Reviewed: Basic Metabolic Panel:  Recent Labs Lab 12/18/14 1315 12/19/14 1747 12/20/14 0431 12/21/14 0525  NA 135 136 136 135  K 3.8 3.5 3.5 3.4*  CL 97* 94* 96* 95*  CO2 27 31 29 30   GLUCOSE 154* 106* 111* 112*  BUN 15 16 12 16   CREATININE 1.20* 1.22* 1.10* 1.28*  CALCIUM 9.1 9.5 9.0 8.8*   Liver Function Tests:  Recent Labs Lab 12/19/14 1747 12/20/14 0431  AST 38 30  ALT 34 26  ALKPHOS 57 45  BILITOT 0.5 0.6  PROT 7.8 6.7  ALBUMIN 3.6 3.0*   No results for  input(s): LIPASE, AMYLASE in the last 168 hours. No results for input(s): AMMONIA in the last 168 hours. CBC:  Recent Labs Lab 12/18/14 1315 12/19/14 1747 12/20/14 0431 12/21/14 0525  WBC 6.0 7.8 7.6 8.9  NEUTROABS  --  5.0  --   --   HGB 12.1 12.5 10.9* 10.9*  HCT 37.2 38.4 34.3* 33.4*  MCV 91.2 91.2 91.0 90.0  PLT 202 232 216 222    Recent Results (from the past 240 hour(s))  Culture, blood (single)     Status: None (Preliminary result)   Collection Time: 12/18/14  1:10 PM  Result Value Ref Range Status   Specimen Description BLOOD LEFT ARM  Final   Special Requests BOTTLES DRAWN AEROBIC AND ANAEROBIC 5CC  Final   Culture NO GROWTH 3 DAYS  Final   Report Status PENDING  Incomplete  Culture, blood (routine x 2)     Status: None (Preliminary result)   Collection Time: 12/19/14  5:22 PM  Result Value Ref Range Status   Specimen Description BLOOD LEFT ARM  Final   Special Requests BOTTLES DRAWN AEROBIC AND ANAEROBIC 5CC  Final   Culture NO GROWTH 2 DAYS  Final   Report Status PENDING  Incomplete  Culture, blood (routine x 2)     Status: None (Preliminary result)   Collection Time: 12/19/14  7:07 PM  Result Value Ref Range Status   Specimen Description BLOOD LEFT ARM  Final   Special Requests BOTTLES DRAWN AEROBIC AND ANAEROBIC 5CC  Final   Culture NO GROWTH 2 DAYS  Final   Report Status PENDING  Incomplete  Urine culture     Status: None   Collection Time: 12/19/14  8:15 PM  Result Value Ref Range Status   Specimen Description URINE, CLEAN CATCH  Final   Special Requests NONE  Final   Culture 1,000 COLONIES/mL INSIGNIFICANT GROWTH  Final   Report Status 12/20/2014 FINAL  Final  MRSA PCR Screening     Status: Abnormal   Collection Time: 12/19/14 11:03 PM  Result Value Ref Range Status   MRSA by PCR POSITIVE (A) NEGATIVE Final    Comment:        The GeneXpert MRSA Assay (FDA approved for NASAL specimens only), is one component of a comprehensive MRSA  colonization surveillance program. It is not intended to diagnose MRSA infection nor to guide or monitor treatment for MRSA infections. RESULT CALLED TO, READ BACK BY AND VERIFIED WITH: R BROWN @0047  12/20/14 MKELLY   C difficile quick scan w PCR reflex     Status: None   Collection Time: 12/20/14 11:32 AM  Result Value Ref Range Status   C Diff antigen NEGATIVE NEGATIVE Final   C Diff toxin NEGATIVE NEGATIVE Final   C Diff interpretation Negative for toxigenic C. difficile  Final     Studies: No results found.  Scheduled Meds: . B-complex with vitamin C  1 tablet Oral QPM  . calcium carbonate  1 tablet Oral BID WC  . cholecalciferol  1,000 Units Oral Daily  . citalopram  20 mg Oral Daily  . digoxin  0.0625 mg Oral Daily  . feeding supplement (ENSURE ENLIVE)  237 mL Oral BID BM  . furosemide  40 mg Oral Daily  . ivabradine  2.5 mg Oral BID WC  . levothyroxine  25 mcg Oral QAC breakfast  . multivitamin with minerals  1 tablet Oral Daily  . pantoprazole  40 mg Oral BID  . piperacillin-tazobactam (ZOSYN)  IV  3.375 g Intravenous Q8H  . potassium chloride  20 mEq Oral Daily  . rivaroxaban  20 mg Oral Q supper  . saccharomyces boulardii  250 mg Oral BID  . sodium chloride  3 mL Intravenous Q12H  . sodium chloride  3 mL Intravenous Q12H  . spironolactone  25 mg Oral Daily  . vancomycin  1,000 mg Intravenous Q24H   Continuous Infusions: . milrinone 0.125 mcg/kg/min (12/21/14 1502)    Principal Problem:   Fever Active Problems:   Essential (primary) hypertension   CKD (chronic kidney disease) stage 3, GFR 30-59 ml/min   Hypothyroidism   Chronic systolic CHF (congestive heart failure), NYHA class 4 ([place])   Bacteremia    Time spent: 30 min    Dillin Lofgren, [facility] Pager [phone]. If 7PM-7AM, please contact night-coverage at [email or web address], password [email or web address] [place] 12/21/2014, 6:33 PM  LOS: 2 days

## 2014-12-21 NOTE — Progress Notes (Signed)
Transferred pt to [place] on telemonitoring. Medications and chart given to Network engineer. Consuelo Pandy RN

## 2014-12-21 NOTE — Progress Notes (Signed)
Patient arrived to 3E18 from 3S accompanied by daughter. Safety precautions and orders reviewed with patient/family. TELE applied and confirmed. No distress noted at this time. Will continue to monitor.   Ave Filter, RN

## 2014-12-22 ENCOUNTER — Encounter: Payer: Self-pay | Admitting: *Deleted

## 2014-12-22 DIAGNOSIS — B9689 Other specified bacterial agents as the cause of diseases classified elsewhere: Secondary | ICD-10-CM

## 2014-12-22 DIAGNOSIS — A369 Diphtheria, unspecified: Secondary | ICD-10-CM

## 2014-12-22 DIAGNOSIS — E43 Unspecified severe protein-calorie malnutrition: Secondary | ICD-10-CM

## 2014-12-22 DIAGNOSIS — T80219A Unspecified infection due to central venous catheter, initial encounter: Secondary | ICD-10-CM

## 2014-12-22 LAB — CBC
HEMATOCRIT: 33.1 % — AB (ref 36.0–46.0)
HEMOGLOBIN: 10.6 g/dL — AB (ref 12.0–15.0)
MCH: 29.1 pg (ref 26.0–34.0)
MCHC: 32 g/dL (ref 30.0–36.0)
MCV: 90.9 fL (ref 78.0–100.0)
Platelets: 236 10*3/uL (ref 150–400)
RBC: 3.64 MIL/uL — AB (ref 3.87–5.11)
RDW: 16.7 % — ABNORMAL HIGH (ref 11.5–15.5)
WBC: 7.5 10*3/uL (ref 4.0–10.5)

## 2014-12-22 LAB — BASIC METABOLIC PANEL
ANION GAP: 8 (ref 5–15)
BUN: 15 mg/dL (ref 6–20)
CHLORIDE: 96 mmol/L — AB (ref 101–111)
CO2: 32 mmol/L (ref 22–32)
Calcium: 8.8 mg/dL — ABNORMAL LOW (ref 8.9–10.3)
Creatinine, Ser: 1.25 mg/dL — ABNORMAL HIGH (ref 0.44–1.00)
GFR, EST AFRICAN AMERICAN: 44 mL/min — AB (ref >60)
GFR, EST NON AFRICAN AMERICAN: 38 mL/min — AB (ref >60)
Glucose, Bld: 105 mg/dL — ABNORMAL HIGH (ref 65–99)
POTASSIUM: 3.3 mmol/L — AB (ref 3.5–5.1)
SODIUM: 136 mmol/L (ref 135–145)

## 2014-12-22 MED ORDER — IVABRADINE HCL 5 MG PO TABS
5.0000 mg | ORAL_TABLET | Freq: Two times a day (BID) | ORAL | Status: DC
  Administered 2014-12-22 – 2014-12-24 (×5): 5 mg via ORAL
  Filled 2014-12-22 (×7): qty 1

## 2014-12-22 MED ORDER — SODIUM CHLORIDE 0.9 % IJ SOLN
10.0000 mL | INTRAMUSCULAR | Status: DC | PRN
  Administered 2014-12-22: 10 mL
  Filled 2014-12-22: qty 40

## 2014-12-22 MED ORDER — MUPIROCIN 2 % EX OINT
1.0000 "application " | TOPICAL_OINTMENT | Freq: Two times a day (BID) | CUTANEOUS | Status: DC
  Administered 2014-12-22 – 2014-12-24 (×4): 1 via NASAL
  Filled 2014-12-22: qty 22

## 2014-12-22 MED ORDER — POTASSIUM CHLORIDE ER 10 MEQ PO TBCR
40.0000 meq | EXTENDED_RELEASE_TABLET | Freq: Every day | ORAL | Status: DC
  Administered 2014-12-22 – 2014-12-24 (×3): 40 meq via ORAL
  Filled 2014-12-22 (×6): qty 4

## 2014-12-22 MED ORDER — CHLORHEXIDINE GLUCONATE CLOTH 2 % EX PADS
6.0000 | MEDICATED_PAD | Freq: Every day | CUTANEOUS | Status: DC
  Administered 2014-12-23 – 2014-12-24 (×2): 6 via TOPICAL

## 2014-12-22 NOTE — Progress Notes (Signed)
Advanced Heart Failure Rounding Note   Subjective:     Last fever was yesterday at 4p. Afebrile since. Feeling better. Apparently [place] from [facility] drawn from PICC is growing one colony. Awaiting speciation. On vanc and zosyn. Denies dyspnea. + fatigue. Corlanor started yesterday. Tolerating well. HR 90-100s   Objective:   Weight Range:  Vital Signs:   Temp:  [97.2 F (36.2 C)-99.1 F (37.3 C)] 97.2 F (36.2 C) (12/04 0602) Pulse Rate:  [84-99] 84 (12/04 0602) Resp:  [18-20] 18 (12/04 0602) BP: (104-113)/(52-56) 112/53 mmHg (12/04 0602) SpO2:  [96 %-98 %] 98 % (12/04 0602) Weight:  [71.4 kg (157 lb 6.5 oz)-72.3 kg (159 lb 6.3 oz)] 71.4 kg (157 lb 6.5 oz) (12/04 0602) Last BM Date: 12/20/14  Weight change: Filed Weights   12/21/14 0520 12/21/14 1643 12/22/14 0602  Weight: 73 kg (160 lb 15 oz) 72.3 kg (159 lb 6.3 oz) 71.4 kg (157 lb 6.5 oz)    Intake/Output:   Intake/Output Summary (Last 24 hours) at 12/22/14 1107 Last data filed at 12/22/14 0905  Gross per 24 hour  Intake 1393.07 ml  Output   1252 ml  Net 141.07 ml     Physical Exam: General: In bed NAD HEENT: Normal, without mass or lesion. Neck: Supple, no bruits. JVP 6 cm. Carotids 2+. No thyromegaly or nodule. Heart: PMI nondisplaced tachy, no s4. +S3, no murmur. Lungs: Slightly diminished R base Abdomen: Soft, NT, ND, no HSM +BS x 4.  Extremities: No clubbing, cyanosis. Trace ankle edema. RUE picc looks fine. No exudate or erythema Neuro: Alert and oriented X 3. Moves all extremities spontaneously.  Telemetry: sinus 90-100 with LBBB  Labs: Basic Metabolic Panel:  Recent Labs Lab 12/18/14 1315 12/19/14 1747 12/20/14 0431 12/21/14 0525 12/22/14 0504  NA 135 136 136 135 136  K 3.8 3.5 3.5 3.4* 3.3*  CL 97* 94* 96* 95* 96*  CO2 27 31 29 30  32  GLUCOSE 154* 106* 111* 112* 105*  BUN 15 16 12 16 15   CREATININE 1.20* 1.22* 1.10* 1.28* 1.25*  CALCIUM 9.1 9.5 9.0 8.8* 8.8*    Liver  Function Tests:  Recent Labs Lab 12/19/14 1747 12/20/14 0431  AST 38 30  ALT 34 26  ALKPHOS 57 45  BILITOT 0.5 0.6  PROT 7.8 6.7  ALBUMIN 3.6 3.0*   No results for input(s): LIPASE, AMYLASE in the last 168 hours. No results for input(s): AMMONIA in the last 168 hours.  CBC:  Recent Labs Lab 12/18/14 1315 12/19/14 1747 12/20/14 0431 12/21/14 0525 12/22/14 0504  WBC 6.0 7.8 7.6 8.9 7.5  NEUTROABS  --  5.0  --   --   --   HGB 12.1 12.5 10.9* 10.9* 10.6*  HCT 37.2 38.4 34.3* 33.4* 33.1*  MCV 91.2 91.2 91.0 90.0 90.9  PLT 202 232 216 222 236    Cardiac Enzymes: No results for input(s): CKTOTAL, CKMB, CKMBINDEX, TROPONINI in the last 168 hours.  BNP: BNP (last 3 results)  Recent Labs  08/16/14 0855 08/19/14 1523 10/11/14 1459  BNP 1169.9* 1627.6* 2298.4*    ProBNP (last 3 results)  Recent Labs  09/25/14 1332  PROBNP 2219.0*      Other results:  Imaging: No results found.   Medications:     Scheduled Medications: . B-complex with vitamin C  1 tablet Oral QPM  . calcium carbonate  1 tablet Oral BID WC  . cholecalciferol  1,000 Units Oral Daily  . citalopram  20  mg Oral Daily  . digoxin  0.0625 mg Oral Daily  . feeding supplement (ENSURE ENLIVE)  237 mL Oral BID BM  . furosemide  40 mg Oral Daily  . ivabradine  2.5 mg Oral BID WC  . levothyroxine  25 mcg Oral QAC breakfast  . multivitamin with minerals  1 tablet Oral Daily  . pantoprazole  40 mg Oral BID  . piperacillin-tazobactam (ZOSYN)  IV  3.375 g Intravenous Q8H  . potassium chloride  40 mEq Oral Daily  . rivaroxaban  20 mg Oral Q supper  . saccharomyces boulardii  250 mg Oral BID  . sodium chloride  3 mL Intravenous Q12H  . sodium chloride  3 mL Intravenous Q12H  . spironolactone  25 mg Oral Daily  . vancomycin  1,000 mg Intravenous Q24H    Infusions: . milrinone 0.125 mcg/kg/min (12/21/14 1502)    PRN Medications: sodium chloride, acetaminophen **OR** acetaminophen, albuterol,  ALPRAZolam, calcium carbonate, diphenoxylate-atropine, nitroGLYCERIN, polyethylene glycol, promethazine, sodium chloride, sodium chloride, sodium chloride, traMADol   Assessment:   1. Fever with Bcx 1/2 Gram positive rods 2. Chronic Systolic Heart failure - NYHA Class IV Echo 10/02/14 EF 15%   -requiring milrinone 3. Recent PE/ DVT bilateral on Xarelto 4. CKD Stage 3  5. Hx of LBBB 6. Intolerance of amiodarone due to possible acute lung toxicity 7. UTI 8. Hypokalemia  Plan/Discussion:    She has had persistent fevers on unclear source. Bcx here are negative. Await further culture info from [place]. Concern obviously for bacteremia related to PICC line. UA mildly positive. Now on vanc/zosyn.Primary team managing (thank you)   HF stable. PICC line still in. Continue milrinone. Tolerating Corlanor will increase to 5 bid. Supp K+.  Length of Stay: 3   Bensimhon, Daniel MD 12/22/2014, 11:07 AM  Advanced Heart Failure Team Pager [phone] (M-F; [place])  Please contact [facility] for night-coverage after hours (4p -7a ) and weekends on [email or web address]

## 2014-12-22 NOTE — Consult Note (Signed)
[name] for Infectious Disease  Date of Admission:  12/19/2014  Date of Consult:  12/22/2014  Reason for Consult: Bacteremia Referring Physician: Short  Impression/Recommendation Bacteremia-diphtheroids PIC line infection Would pull line Give her vanco for 1 week after line removal Stop zosyn Replace line 24-48 after removal.  Repeat BCx 1 week after completing vanco.   CKD 3 She denies conversations regarding starting HD.  She denies issues with IV access She is at baseline re: her Cr  Stage IV CHF Minimize time with [facility] out so that she can continue milrinone.   Thank you so much for this interesting consult,  Available as needed.   [name] (pager) [phone] [email or web address]  Alexandra [name] is an 81 y.o. female.  HPI: 81 yo F with hx home IV milrinone, Class IV CHF (EF 15%). Comes to hospital on 12-1 with fevers at home for ~ 1 week. Her outpt BCx grew diphtheroids in 2/2 BCx (11-29). She was started on vanco/zosyn.   She had temp 101 on adm. She has been afebrile in since adm. WBC normal on adm and has remained normal.  Her BCx at [facility] have been (-).   Past Medical History  Diagnosis Date  . Arthritis   . Headache(784.0)   . Anxiety   . Depression   . Asthma   . CAP (community acquired pneumonia) 12/19/2012  . Intermittent LBBB (left bundle branch block) 12/21/2012  . Obesity   . Peripheral neuropathy ([place])   . Sacroiliitis, not elsewhere classified ([place])   . Osteopenia   . Hypercholesteremia   . Neuropathy ([place])   . Trigger finger     [name]  . Chest pain     NM stress test 05/2011 Normal  . DOE (dyspnea on exertion)     No SOB at rest  . Fatigue     excertional  . breast ca dx'd 2000    left  . Essential (primary) hypertension 04/02/2014  . Adult hypothyroidism 04/02/2014  . Hereditary and idiopathic peripheral neuropathy 07/02/2014  . Leg weakness   . CKD (chronic kidney disease) stage 3, GFR 30-59 ml/min 08/13/2014  .  Anemia of chronic disease 08/13/2014  . CHF (congestive heart failure) ([place]) 08/19/2014  . Situational depression     Past Surgical History  Procedure Laterality Date  . Leg surgery    . Breast lumpectomy      breast cancer  . Lumbar laminectomy    . Achilles tendon surgery    . Tonsillectomy    . Vesicovaginal fistula closure w/ tah      DC hysterectomy  . Polypectomy    . Cholecystectomy N/A 08/13/2014    Procedure: LAPAROSCOPIC CHOLECYSTECTOMY;  Surgeon: Stark Klein, MD;  Location: [facility] ORS;  Service: General;  Laterality: N/A;     Allergies  Allergen Reactions  . Amiodarone Shortness Of Breath and Nausea And Vomiting  . Cymbalta [Duloxetine Hcl] Other (See Comments)    Depression  . Lyrica [Pregabalin] Other (See Comments)    Blurry vision    Medications:  Scheduled: . B-complex with vitamin C  1 tablet Oral QPM  . calcium carbonate  1 tablet Oral BID WC  . cholecalciferol  1,000 Units Oral Daily  . citalopram  20 mg Oral Daily  . digoxin  0.0625 mg Oral Daily  . feeding supplement (ENSURE ENLIVE)  237 mL Oral BID BM  . furosemide  40 mg Oral Daily  . ivabradine  5 mg Oral BID WC  .  levothyroxine  25 mcg Oral QAC breakfast  . multivitamin with minerals  1 tablet Oral Daily  . pantoprazole  40 mg Oral BID  . piperacillin-tazobactam (ZOSYN)  IV  3.375 g Intravenous Q8H  . potassium chloride  40 mEq Oral Daily  . rivaroxaban  20 mg Oral Q supper  . saccharomyces boulardii  250 mg Oral BID  . sodium chloride  3 mL Intravenous Q12H  . sodium chloride  3 mL Intravenous Q12H  . spironolactone  25 mg Oral Daily  . vancomycin  1,000 mg Intravenous Q24H    Abtx:  Anti-infectives    Start     Dose/Rate Route Frequency Ordered Stop   12/20/14 1900  vancomycin (VANCOCIN) IVPB 1000 mg/200 mL premix     1,000 mg 200 mL/hr over 60 Minutes Intravenous Every 24 hours 12/19/14 1941     12/20/14 1400  piperacillin-tazobactam (ZOSYN) IVPB 3.375 g     3.375 g 12.5 mL/hr over 240  Minutes Intravenous Every 8 hours 12/20/14 0827     12/20/14 0830  piperacillin-tazobactam (ZOSYN) IVPB 3.375 g     3.375 g 100 mL/hr over 30 Minutes Intravenous  Once 12/20/14 0827 12/20/14 1122   12/19/14 1930  vancomycin (VANCOCIN) IVPB 1000 mg/200 mL premix     1,000 mg 200 mL/hr over 60 Minutes Intravenous  Once 12/19/14 1916 12/19/14 2007      Total days of antibiotics:   Vancomycin 12/1   Zosyn 12/2           Social History:  reports that she has never smoked. She has never used smokeless tobacco. She reports that she does not drink alcohol or use illicit drugs.  Family History  Problem Relation Age of Onset  . Diabetes Brother   . Alzheimer's disease Sister   . CVA Daughter     01/21/09  . Pancreatic cancer Father 100  . Pneumonia Mother 68    cause of death  . Heart attack Neg Hx   . Stroke Daughter   . Hypertension Daughter     General ROS: normal urination. has had diarrhea since September. no problems with PIC- no pain, no d/c. no SOB.   Blood pressure 112/53, pulse 105, temperature 97.2 F (36.2 C), temperature source Oral, resp. rate 18, height '5\' 4"'  (1.626 m), weight 71.4 kg (157 lb 6.5 oz), SpO2 98 %. General appearance: alert, cooperative and no distress Eyes: negative findings: conjunctivae and sclerae normal and pupils equal, round, reactive to light and accomodation Throat: normal findings: oropharynx pink & moist without lesions or evidence of thrush and abnormal findings: dentition: multiple carries Neck: no adenopathy and supple, symmetrical, trachea midline Lungs: clear to auscultation bilaterally Heart: regular rate and rhythm Abdomen: normal findings: bowel sounds normal and soft, non-tender Extremities: edema none. RUE PIC is clean. non-tender, no cordis, no d/c.    Results for orders placed or performed during the hospital encounter of 12/19/14 (from the past 48 hour(s))  Basic metabolic panel     Status: Abnormal   Collection Time: 12/21/14   5:25 AM  Result Value Ref Range   Sodium 135 135 - 145 mmol/L   Potassium 3.4 (L) 3.5 - 5.1 mmol/L   Chloride 95 (L) 101 - 111 mmol/L   CO2 30 22 - 32 mmol/L   Glucose, Bld 112 (H) 65 - 99 mg/dL   BUN 16 6 - 20 mg/dL   Creatinine, Ser 1.28 (H) 0.44 - 1.00 mg/dL   Calcium 8.8 (L) 8.9 -  10.3 mg/dL   GFR calc non Af Amer 37 (L) >60 mL/min   GFR calc Af Amer 43 (L) >60 mL/min    Comment: (NOTE) The eGFR has been calculated using the CKD EPI equation. This calculation has not been validated in all clinical situations. eGFR's persistently <60 mL/min signify possible Chronic Kidney Disease.    Anion gap 10 5 - 15  CBC     Status: Abnormal   Collection Time: 12/21/14  5:25 AM  Result Value Ref Range   WBC 8.9 4.0 - 10.5 K/uL   RBC 3.71 (L) 3.87 - 5.11 MIL/uL   Hemoglobin 10.9 (L) 12.0 - 15.0 g/dL   HCT 33.4 (L) 36.0 - 46.0 %   MCV 90.0 78.0 - 100.0 fL   MCH 29.4 26.0 - 34.0 pg   MCHC 32.6 30.0 - 36.0 g/dL   RDW 16.8 (H) 11.5 - 15.5 %   Platelets 222 150 - 400 K/uL  Basic metabolic panel     Status: Abnormal   Collection Time: 12/22/14  5:04 AM  Result Value Ref Range   Sodium 136 135 - 145 mmol/L   Potassium 3.3 (L) 3.5 - 5.1 mmol/L   Chloride 96 (L) 101 - 111 mmol/L   CO2 32 22 - 32 mmol/L   Glucose, Bld 105 (H) 65 - 99 mg/dL   BUN 15 6 - 20 mg/dL   Creatinine, Ser 1.25 (H) 0.44 - 1.00 mg/dL   Calcium 8.8 (L) 8.9 - 10.3 mg/dL   GFR calc non Af Amer 38 (L) >60 mL/min   GFR calc Af Amer 44 (L) >60 mL/min    Comment: (NOTE) The eGFR has been calculated using the CKD EPI equation. This calculation has not been validated in all clinical situations. eGFR's persistently <60 mL/min signify possible Chronic Kidney Disease.    Anion gap 8 5 - 15  CBC     Status: Abnormal   Collection Time: 12/22/14  5:04 AM  Result Value Ref Range   WBC 7.5 4.0 - 10.5 K/uL   RBC 3.64 (L) 3.87 - 5.11 MIL/uL   Hemoglobin 10.6 (L) 12.0 - 15.0 g/dL   HCT 33.1 (L) 36.0 - 46.0 %   MCV 90.9 78.0 -  100.0 fL   MCH 29.1 26.0 - 34.0 pg   MCHC 32.0 30.0 - 36.0 g/dL   RDW 16.7 (H) 11.5 - 15.5 %   Platelets 236 150 - 400 K/uL      Component Value Date/Time   SDES URINE, CLEAN CATCH 12/19/2014 2015   SPECREQUEST NONE 12/19/2014 2015   CULT 1,000 COLONIES/mL INSIGNIFICANT GROWTH 12/19/2014 2015   REPTSTATUS 12/20/2014 FINAL 12/19/2014 2015   No results found. Recent Results (from the past 240 hour(s))  Culture, blood (single)     Status: None (Preliminary result)   Collection Time: 12/18/14  1:10 PM  Result Value Ref Range Status   Specimen Description BLOOD LEFT ARM  Final   Special Requests BOTTLES DRAWN AEROBIC AND ANAEROBIC 5CC  Final   Culture NO GROWTH 3 DAYS  Final   Report Status PENDING  Incomplete  Culture, blood (routine x 2)     Status: None (Preliminary result)   Collection Time: 12/19/14  5:22 PM  Result Value Ref Range Status   Specimen Description BLOOD LEFT ARM  Final   Special Requests BOTTLES DRAWN AEROBIC AND ANAEROBIC 5CC  Final   Culture NO GROWTH 2 DAYS  Final   Report Status PENDING  Incomplete  Culture, blood (  routine x 2)     Status: None (Preliminary result)   Collection Time: 12/19/14  7:07 PM  Result Value Ref Range Status   Specimen Description BLOOD LEFT ARM  Final   Special Requests BOTTLES DRAWN AEROBIC AND ANAEROBIC 5CC  Final   Culture NO GROWTH 2 DAYS  Final   Report Status PENDING  Incomplete  Urine culture     Status: None   Collection Time: 12/19/14  8:15 PM  Result Value Ref Range Status   Specimen Description URINE, CLEAN CATCH  Final   Special Requests NONE  Final   Culture 1,000 COLONIES/mL INSIGNIFICANT GROWTH  Final   Report Status 12/20/2014 FINAL  Final  MRSA PCR Screening     Status: Abnormal   Collection Time: 12/19/14 11:03 PM  Result Value Ref Range Status   MRSA by PCR POSITIVE (A) NEGATIVE Final    Comment:        The GeneXpert MRSA Assay (FDA approved for NASAL specimens only), is one component of a comprehensive  MRSA colonization surveillance program. It is not intended to diagnose MRSA infection nor to guide or monitor treatment for MRSA infections. RESULT CALLED TO, READ BACK BY AND VERIFIED WITH: R BROWN '@0047'  12/20/14 MKELLY   C difficile quick scan w PCR reflex     Status: None   Collection Time: 12/20/14 11:32 AM  Result Value Ref Range Status   C Diff antigen NEGATIVE NEGATIVE Final   C Diff toxin NEGATIVE NEGATIVE Final   C Diff interpretation Negative for toxigenic C. difficile  Final      12/22/2014, 11:47 AM     LOS: 3 days    Records and images were personally reviewed where available.

## 2014-12-22 NOTE — Progress Notes (Signed)
[facility] PROGRESS NOTE  Alexandra [name] [id number] DOB: [birth year] DOA: 12/19/2014 PCP:  Melinda Crutch, MD  Brief Summary  Alexandra [name] is a 81 y.o. 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 Patient has been followed in the heart failure clinic and was noted over the last week or so to be having fevers. The temperatures were low grade to about 100F so she had blood cultures drawn by her home health agency on 11/29.  One of two cultures grew possible gram positive rods.  She was called at home and advised to come to the hospital for further evaluation/concern for PICC line infection.    Assessment/Plan  PICC line infection.  BOTH blood cultures from PICC line reported as SAME bacteria (Corynebacterium) suggesting that this is not a contaminant but rather a true line infection -  D/c PICC line for 24 hours -  D/c zosyn and continue monotherapy vancomycin -  Continue vancomycin for 1 week after PICC line removed (day 1 on 12/5) -  Repeat blood culture 1 week after stopping vancomycin -  Continue florastor  Chronic systolic heart failure, NICM NYHA IV, on milrinone infusion -  Continue milrinone and digoxin -  Continue spironolactone and once daily oral lasix -  Wt trending down slightly, I/O not strictly recorded -  corlanor added 12/3 for persistent tachycardia -  Tele:  NSR, rate in 80s-90s   CKD stage 3, creatinine stable near 1.1.-1.2  Anemia of chronic disease, hemoglobin stable  Hx of DVT/PE, stable on xarelto  Chronic diarrhea since cholecystectomy -  C. Diff negative -  Continue lomotil prn  Hypothyroidism, stable, continue synthroid  Depression/anxiety, currently anxious.  Continue prn xanax and max dose citalopram based on age  Hypokalemia, increase oral  potassium  Diet:  Low sodium Access:  Right arm PICC line IVF:  off Proph:  xarelto  Code Status: full Family Communication: patient and her son, answered many questions Disposition Plan: pending 24 hour PICC line holiday, then replacement with [facility] services for IV vancomycin in addition to milrinone   Consultants:  Heart failure, Dr. Haroldine Laws  Procedures:  none  Antibiotics:  Vancomycin 12/1 >   Zosyn 12/2 > 12/4  HPI/Subjective:  Fevers and chills are improving.  Denies dysuria, frequency, urgency, or pain at her PICC site.  PICC has been working fine.   Depressed and wants to go home.  Objective: Filed Vitals:   12/22/14 0011 12/22/14 0602 12/22/14 1106 12/22/14 1230  BP: 110/52 112/53  110/55  Pulse: 85 84 105 97  Temp: 98 F (36.7 C) 97.2 F (36.2 C)  98 F (36.7 C)  TempSrc: Oral Oral  Oral  Resp: 18 18  20   Height:      Weight:  71.4 kg (157 lb 6.5 oz)    SpO2: 96% 98%  100%    Intake/Output Summary (Last 24 hours) at 12/22/14 1626 Last data filed at 12/22/14 0905  Gross per 24 hour  Intake 782.67 ml  Output    600 ml  Net 182.67 ml   Filed Weights   12/21/14 0520 12/21/14 1643 12/22/14 0602  Weight: 73 kg (160 lb 15 oz) 72.3 kg (159 lb 6.3 oz) 71.4 kg (157 lb 6.5 oz)   Body mass index is 27.01 kg/(m^2).  Exam:   General:  Adult female, No  acute distress  HEENT:  NCAT, MMM  Cardiovascular:  RRR, nl S1, S2 no mrg, 2+ pulses, warm extremities  Respiratory:  CTAB, no increased WOB  Abdomen:   NABS, soft, NT/ND  MSK:   Normal tone and bulk, no LEE  Derm:  PICC line site appears c/d/i, no erythema or purulence again today  Data Reviewed: Basic Metabolic Panel:  Recent Labs Lab 12/18/14 1315 12/19/14 1747 12/20/14 0431 12/21/14 0525 12/22/14 0504  NA 135 136 136 135 136  K 3.8 3.5 3.5 3.4* 3.3*  CL 97* 94* 96* 95* 96*  CO2 27 31 29 30  32  GLUCOSE 154* 106* 111* 112* 105*  BUN 15 16 12 16 15   CREATININE 1.20* 1.22* 1.10* 1.28*  1.25*  CALCIUM 9.1 9.5 9.0 8.8* 8.8*   Liver Function Tests:  Recent Labs Lab 12/19/14 1747 12/20/14 0431  AST 38 30  ALT 34 26  ALKPHOS 57 45  BILITOT 0.5 0.6  PROT 7.8 6.7  ALBUMIN 3.6 3.0*   No results for input(s): LIPASE, AMYLASE in the last 168 hours. No results for input(s): AMMONIA in the last 168 hours. CBC:  Recent Labs Lab 12/18/14 1315 12/19/14 1747 12/20/14 0431 12/21/14 0525 12/22/14 0504  WBC 6.0 7.8 7.6 8.9 7.5  NEUTROABS  --  5.0  --   --   --   HGB 12.1 12.5 10.9* 10.9* 10.6*  HCT 37.2 38.4 34.3* 33.4* 33.1*  MCV 91.2 91.2 91.0 90.0 90.9  PLT 202 232 216 222 236    Recent Results (from the past 240 hour(s))  Culture, blood (single)     Status: None (Preliminary result)   Collection Time: 12/18/14  1:10 PM  Result Value Ref Range Status   Specimen Description BLOOD LEFT ARM  Final   Special Requests BOTTLES DRAWN AEROBIC AND ANAEROBIC 5CC  Final   Culture NO GROWTH 4 DAYS  Final   Report Status PENDING  Incomplete  Culture, blood (routine x 2)     Status: None (Preliminary result)   Collection Time: 12/19/14  5:22 PM  Result Value Ref Range Status   Specimen Description BLOOD LEFT ARM  Final   Special Requests BOTTLES DRAWN AEROBIC AND ANAEROBIC 5CC  Final   Culture NO GROWTH 3 DAYS  Final   Report Status PENDING  Incomplete  Culture, blood (routine x 2)     Status: None (Preliminary result)   Collection Time: 12/19/14  7:07 PM  Result Value Ref Range Status   Specimen Description BLOOD LEFT ARM  Final   Special Requests BOTTLES DRAWN AEROBIC AND ANAEROBIC 5CC  Final   Culture NO GROWTH 3 DAYS  Final   Report Status PENDING  Incomplete  Urine culture     Status: None   Collection Time: 12/19/14  8:15 PM  Result Value Ref Range Status   Specimen Description URINE, CLEAN CATCH  Final   Special Requests NONE  Final   Culture 1,000 COLONIES/mL INSIGNIFICANT GROWTH  Final   Report Status 12/20/2014 FINAL  Final  MRSA PCR Screening      Status: Abnormal   Collection Time: 12/19/14 11:03 PM  Result Value Ref Range Status   MRSA by PCR POSITIVE (A) NEGATIVE Final    Comment:        The GeneXpert MRSA Assay (FDA approved for NASAL specimens only), is one component of a comprehensive MRSA colonization surveillance program. It is not intended to diagnose MRSA infection nor to guide or monitor treatment for MRSA infections.  RESULT CALLED TO, READ BACK BY AND VERIFIED WITH: R BROWN @0047  12/20/14 MKELLY   C difficile quick scan w PCR reflex     Status: None   Collection Time: 12/20/14 11:32 AM  Result Value Ref Range Status   C Diff antigen NEGATIVE NEGATIVE Final   C Diff toxin NEGATIVE NEGATIVE Final   C Diff interpretation Negative for toxigenic C. difficile  Final     Studies: No results found.  Scheduled Meds: . B-complex with vitamin C  1 tablet Oral QPM  . calcium carbonate  1 tablet Oral BID WC  . cholecalciferol  1,000 Units Oral Daily  . citalopram  20 mg Oral Daily  . digoxin  0.0625 mg Oral Daily  . feeding supplement (ENSURE ENLIVE)  237 mL Oral BID BM  . furosemide  40 mg Oral Daily  . ivabradine  5 mg Oral BID WC  . levothyroxine  25 mcg Oral QAC breakfast  . multivitamin with minerals  1 tablet Oral Daily  . pantoprazole  40 mg Oral BID  . potassium chloride  40 mEq Oral Daily  . rivaroxaban  20 mg Oral Q supper  . saccharomyces boulardii  250 mg Oral BID  . sodium chloride  3 mL Intravenous Q12H  . sodium chloride  3 mL Intravenous Q12H  . spironolactone  25 mg Oral Daily  . vancomycin  1,000 mg Intravenous Q24H   Continuous Infusions: . milrinone 0.125 mcg/kg/min (12/22/14 1120)    Principal Problem:   Fever Active Problems:   Essential (primary) hypertension   CKD (chronic kidney disease) stage 3, GFR 30-59 ml/min   Hypothyroidism   Chronic systolic CHF (congestive heart failure), NYHA class 4 ([place])   Bacteremia    Time spent: 30 min    Alonso Gapinski, [place]  [facility] Pager [phone]. If 7PM-7AM, please contact night-coverage at [email or web address], password [facility] 12/22/2014, 4:26 PM  LOS: 3 days

## 2014-12-23 ENCOUNTER — Encounter: Payer: Self-pay | Admitting: Internal Medicine

## 2014-12-23 LAB — BASIC METABOLIC PANEL
Anion gap: 9 (ref 5–15)
BUN: 14 mg/dL (ref 6–20)
CALCIUM: 8.9 mg/dL (ref 8.9–10.3)
CO2: 30 mmol/L (ref 22–32)
CREATININE: 1.2 mg/dL — AB (ref 0.44–1.00)
Chloride: 98 mmol/L — ABNORMAL LOW (ref 101–111)
GFR calc non Af Amer: 40 mL/min — ABNORMAL LOW (ref >60)
GFR, EST AFRICAN AMERICAN: 46 mL/min — AB (ref >60)
Glucose, Bld: 109 mg/dL — ABNORMAL HIGH (ref 65–99)
Potassium: 3.6 mmol/L (ref 3.5–5.1)
Sodium: 137 mmol/L (ref 135–145)

## 2014-12-23 LAB — CBC
HCT: 33.7 % — ABNORMAL LOW (ref 36.0–46.0)
Hemoglobin: 11 g/dL — ABNORMAL LOW (ref 12.0–15.0)
MCH: 29.9 pg (ref 26.0–34.0)
MCHC: 32.6 g/dL (ref 30.0–36.0)
MCV: 91.6 fL (ref 78.0–100.0)
PLATELETS: 253 10*3/uL (ref 150–400)
RBC: 3.68 MIL/uL — AB (ref 3.87–5.11)
RDW: 16.7 % — ABNORMAL HIGH (ref 11.5–15.5)
WBC: 7.5 10*3/uL (ref 4.0–10.5)

## 2014-12-23 LAB — CULTURE, BLOOD (SINGLE): CULTURE: NO GROWTH

## 2014-12-23 LAB — VANCOMYCIN, TROUGH: Vancomycin Tr: 14 ug/mL (ref 10.0–20.0)

## 2014-12-23 MED ORDER — VANCOMYCIN HCL 500 MG IV SOLR
500.0000 mg | Freq: Two times a day (BID) | INTRAVENOUS | Status: DC
  Administered 2014-12-23 – 2014-12-24 (×3): 500 mg via INTRAVENOUS
  Filled 2014-12-23 (×4): qty 500

## 2014-12-23 MED ORDER — VANCOMYCIN HCL 10 G IV SOLR
1250.0000 mg | INTRAVENOUS | Status: DC
  Filled 2014-12-23: qty 1250

## 2014-12-23 NOTE — Progress Notes (Signed)
CARDIAC REHAB PHASE I   PRE:  Rate/Rhythm: 98 by dinamapp (off monitor)    BP: sitting 112/55    SaO2: 98 RA  MODE:  Ambulation: 120 ft   POST:  Rate/Rhythm: 108 by dinamapp    BP: sitting 120/63     SaO2: 98 RA  Pt able to stand with min assist and walk with RW. No major c/o but did not want to go far. Fairly particular and anxious. Seemed to relax the more we talked. Gave HF booklet and reminders of low sodium and daily wts. Voiced understanding. VSS. Return to recliner with chair alarm. Will f/u as time permits. [place], [place], ACSM 12/23/2014 3:05 PM

## 2014-12-23 NOTE — Progress Notes (Signed)
[facility] PROGRESS NOTE  LOVADA CASERTA [id number] DOB: [birth year] DOA: 12/19/2014 PCP:  Melinda Crutch, MD  Brief Summary  Alexandra [name] is a 81 y.o. 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 Patient has been followed in the heart failure clinic and was noted over the last week or so to be having fevers. The temperatures were low grade to about 100F so she had blood cultures drawn by her home health agency on 11/29.  One of two cultures grew possible gram positive rods.  She was called at home and advised to come to the hospital for further evaluation/concern for PICC line infection.    Assessment/Plan  PICC line infection.  BOTH blood cultures from PICC line reported as SAME bacteria (Corynebacterium) suggesting that this is not a contaminant but rather a true line infection -  Blood culture from 12/1 growing GPR, likely also Corynebacterium  -  PICC line d/c'd on 12/4 -  Blood culture 12/5 -  Replace PICC tomorrow -  Continue vancomycin -  Continue vancomycin for 1 week after PICC line removed 12/5 -  Repeat blood culture 1 week after stopping vancomycin -  Continue florastor  Chronic systolic heart failure, NICM NYHA IV, on milrinone infusion -  Continue milrinone and digoxin -  Continue spironolactone and once daily oral lasix -  Wt trending down slightly, I/O not strictly recorded -  corlanor added 12/3 for persistent tachycardia -  Tele:  NSR, rate in 80s-90s   CKD stage 3, creatinine stable near 1.1.-1.2  Anemia of chronic disease, hemoglobin stable  Hx of DVT/PE, stable on xarelto  Chronic diarrhea since cholecystectomy -  C. Diff negative -  Continue lomotil prn  Hypothyroidism, stable, continue synthroid  Depression/anxiety, currently anxious.  Continue prn xanax  and max dose citalopram based on age  Hypokalemia, increase oral potassium  Diet:  Low sodium Access:  Right arm PICC line IVF:  off Proph:  xarelto  Code Status: full Family Communication: patient and her son, answered many questions Disposition Plan:   Possibly home tomorrow if culture is negative, PICC line placed, on IV vancomycin   Consultants:  Heart failure, Dr. Haroldine Laws  Procedures:  none  Antibiotics:  Vancomycin 12/1 >   Zosyn 12/2 > 12/4  HPI/Subjective:  Upset about her procedure getting pushed off until later in the month.  Denies fevers, chills, cough, dyspnea, swelling.    Objective: Filed Vitals:   12/23/14 0001 12/23/14 0635 12/23/14 0846 12/23/14 1217  BP: 109/48 106/72 112/53 120/53  Pulse: 87 84 87 88  Temp: 97.8 F (36.6 C) 97.6 F (36.4 C) 97.8 F (36.6 C) 97.6 F (36.4 C)  TempSrc: Oral Oral Oral Oral  Resp: 16 18 18 18   Height:      Weight:  70.98 kg (156 lb 7.7 oz)    SpO2: 95% 98% 98% 97%    Intake/Output Summary (Last 24 hours) at 12/23/14 1806 Last data filed at 12/23/14 1350  Gross per 24 hour  Intake 1183.51 ml  Output   1001 ml  Net 182.51 ml   Filed Weights   12/21/14 1643 12/22/14 0602 12/23/14 0635  Weight: 72.3 kg (159 lb 6.3 oz) 71.4 kg (157 lb 6.5 oz) 70.98 kg (156 lb 7.7 oz)   Body mass index is 26.85 kg/(m^2).  Exam:   General:  Adult female, No acute distress  HEENT:  NCAT, MMM  Cardiovascular:  RRR, nl S1, S2 no mrg, 2+ pulses, warm extremities  Respiratory:  CTAB, no increased WOB  Abdomen:   NABS, soft, NT/ND  MSK:   Normal tone and bulk, no LEE  Data Reviewed: Basic Metabolic Panel:  Recent Labs Lab 12/19/14 1747 12/20/14 0431 12/21/14 0525 12/22/14 0504 12/23/14 0418  NA 136 136 135 136 137  K 3.5 3.5 3.4* 3.3* 3.6  CL 94* 96* 95* 96* 98*  CO2 31 29 30  32 30  GLUCOSE 106* 111* 112* 105* 109*  BUN 16 12 16 15 14   CREATININE 1.22* 1.10* 1.28* 1.25* 1.20*  CALCIUM 9.5 9.0 8.8* 8.8*  8.9   Liver Function Tests:  Recent Labs Lab 12/19/14 1747 12/20/14 0431  AST 38 30  ALT 34 26  ALKPHOS 57 45  BILITOT 0.5 0.6  PROT 7.8 6.7  ALBUMIN 3.6 3.0*   No results for input(s): LIPASE, AMYLASE in the last 168 hours. No results for input(s): AMMONIA in the last 168 hours. CBC:  Recent Labs Lab 12/19/14 1747 12/20/14 0431 12/21/14 0525 12/22/14 0504 12/23/14 0418  WBC 7.8 7.6 8.9 7.5 7.5  NEUTROABS 5.0  --   --   --   --   HGB 12.5 10.9* 10.9* 10.6* 11.0*  HCT 38.4 34.3* 33.4* 33.1* 33.7*  MCV 91.2 91.0 90.0 90.9 91.6  PLT 232 216 222 236 253    Recent Results (from the past 240 hour(s))  Culture, blood (single)     Status: None   Collection Time: 12/18/14  1:10 PM  Result Value Ref Range Status   Specimen Description BLOOD LEFT ARM  Final   Special Requests BOTTLES DRAWN AEROBIC AND ANAEROBIC 5CC  Final   Culture NO GROWTH 5 DAYS  Final   Report Status 12/23/2014 FINAL  Final  Culture, blood (routine x 2)     Status: None (Preliminary result)   Collection Time: 12/19/14  5:22 PM  Result Value Ref Range Status   Specimen Description BLOOD LEFT ARM  Final   Special Requests BOTTLES DRAWN AEROBIC AND ANAEROBIC 5CC  Final   Culture NO GROWTH 4 DAYS  Final   Report Status PENDING  Incomplete  Culture, blood (routine x 2)     Status: None (Preliminary result)   Collection Time: 12/19/14  7:07 PM  Result Value Ref Range Status   Specimen Description BLOOD LEFT ARM  Final   Special Requests BOTTLES DRAWN AEROBIC AND ANAEROBIC 5CC  Final   Culture  Setup Time   Final    GRAM POSITIVE RODS AEROBIC BOTTLE ONLY CRITICAL RESULT CALLED TO, READ BACK BY AND VERIFIED WITH: Kathrin Greathouse AT [id number] 12/22/14 BY V WILKINS CONFIRMED BY Rosa Sanchez    Culture   Final    GRAM POSITIVE RODS CULTURE REINCUBATED FOR BETTER GROWTH    Report Status PENDING  Incomplete  Urine culture     Status: None   Collection Time: 12/19/14  8:15 PM  Result Value Ref Range Status    Specimen Description URINE, CLEAN CATCH  Final   Special Requests NONE  Final   Culture 1,000 COLONIES/mL INSIGNIFICANT GROWTH  Final   Report Status 12/20/2014 FINAL  Final  MRSA PCR Screening     Status: Abnormal   Collection Time: 12/19/14 11:03 PM  Result Value Ref Range Status   MRSA by PCR POSITIVE (A) NEGATIVE Final    Comment:  The GeneXpert MRSA Assay (FDA approved for NASAL specimens only), is one component of a comprehensive MRSA colonization surveillance program. It is not intended to diagnose MRSA infection nor to guide or monitor treatment for MRSA infections. RESULT CALLED TO, READ BACK BY AND VERIFIED WITH: R BROWN @0047  12/20/14 MKELLY   C difficile quick scan w PCR reflex     Status: None   Collection Time: 12/20/14 11:32 AM  Result Value Ref Range Status   C Diff antigen NEGATIVE NEGATIVE Final   C Diff toxin NEGATIVE NEGATIVE Final   C Diff interpretation Negative for toxigenic C. difficile  Final     Studies: No results found.  Scheduled Meds: . B-complex with vitamin C  1 tablet Oral QPM  . calcium carbonate  1 tablet Oral BID WC  . Chlorhexidine Gluconate Cloth  6 each Topical Q0600  . cholecalciferol  1,000 Units Oral Daily  . citalopram  20 mg Oral Daily  . digoxin  0.0625 mg Oral Daily  . feeding supplement (ENSURE ENLIVE)  237 mL Oral BID BM  . furosemide  40 mg Oral Daily  . ivabradine  5 mg Oral BID WC  . levothyroxine  25 mcg Oral QAC breakfast  . multivitamin with minerals  1 tablet Oral Daily  . mupirocin ointment  1 application Nasal BID  . pantoprazole  40 mg Oral BID  . potassium chloride  40 mEq Oral Daily  . rivaroxaban  20 mg Oral Q supper  . saccharomyces boulardii  250 mg Oral BID  . sodium chloride  3 mL Intravenous Q12H  . sodium chloride  3 mL Intravenous Q12H  . spironolactone  25 mg Oral Daily  . vancomycin  1,000 mg Intravenous Q24H   Continuous Infusions: . milrinone 0.125 mcg/kg/min (12/22/14 1120)     Principal Problem:   Fever Active Problems:   Essential (primary) hypertension   CKD (chronic kidney disease) stage 3, GFR 30-59 ml/min   Hypothyroidism   Chronic systolic CHF (congestive heart failure), NYHA class 4 ([place])   Bacteremia    Time spent: 30 min    Kirra Verga, [place] [facility] Pager [phone]. If 7PM-7AM, please contact night-coverage at [email or web address], password [facility] 12/23/2014, 6:06 PM  LOS: 4 days

## 2014-12-23 NOTE — Clinical Documentation Improvement (Signed)
Internal Medicine  Please clarify nutritional status in progress notes and discharge summary:    Document Severity - Severe(third degree), Moderate (second degree), Mild (first degree)  Form - [place]or (rarely seen in the [place]asm[place], Other Condition, Unable to Determine  Other condition  Unable to clinically determine  Document any associated diagnoses/conditions   Supporting Information: :   12/2 RD Initial Nutrition Assessment DOCUMENTATION CODES:  Severe malnutrition in context of chronic illness NUTRITION DIAGNOSIS:  Malnutrition related to chronic illness as evidenced by energy intake < or equal to 75% for > or equal to 1 month, percent weight loss (17% weight loss x 6 months). Nutrition-Focused physical exam completed. Findings are no fat depletion, mild-moderate muscle depletion, and no edema. Patient reports usual weight in the 200's. She recently had to start taking Lasix to remove some fluids, now weight down to 162 lbs. Suspect some weight loss from fluids and some from muscle loss. She has been eating very poorly over the past several months.   Height: 5'4"  Weight 162 lbs    BMI 27.95  Treatment: Diet 2 gram sodium Multivitamin daily Daily wts Strict I&O     Please exercise your independent, professional judgment when responding. A specific answer is not anticipated or expected.   Thank You, [place] [phone]

## 2014-12-23 NOTE — Progress Notes (Signed)
Pt a/o, no c/o pain, pt requested to move room d/t feeling claustrophobic, pt will be moved from rm 3e18 to 3e22, VSS, pt stable

## 2014-12-23 NOTE — Progress Notes (Signed)
[facility]  Patient Status:  Active pt with [facility] prior to this admission  [facility] is providing the following services: HHRN and Home [facility]ro[facility] Pharmacy Team for home Milrinone.  [facility],The hospital team will follow Mrs. Schrier and support DC to home when ordered.   If patient discharges after hours, please call [phone].   Larry Sierras 12/23/2014, 10:55 AM

## 2014-12-23 NOTE — Progress Notes (Addendum)
ANTIBIOTIC CONSULT NOTE - FOLLOW UP  Pharmacy Consult for vancomycin Indication: Bacteremia  Allergies  Allergen Reactions  . Amiodarone Shortness Of Breath and Nausea And Vomiting  . Cymbalta [Duloxetine Hcl] Other (See Comments)    Depression  . Lyrica [Pregabalin] Other (See Comments)    Blurry vision    Patient Measurements: Height: 5\' 4"  (162.6 cm) Weight: 156 lb 7.7 oz (70.98 kg) IBW/kg (Calculated) : 54.7 Vital Signs: Temp: 97.6 F (36.4 C) (12/05 1217) Temp Source: Oral (12/05 1217) BP: 120/53 mmHg (12/05 1217) Pulse Rate: 88 (12/05 1217) Intake/Output from previous day: 12/04 0701 - 12/05 0700 In: 1110.3 [P.O.:820; I.V.:90.3; IV Piggyback:200] Out: 350 [Urine:350] Intake/Output from this shift:    Labs:  Recent Labs  12/21/14 0525 12/22/14 0504 12/23/14 0418  WBC 8.9 7.5 7.5  HGB 10.9* 10.6* 11.0*  PLT 222 236 253  CREATININE 1.28* 1.25* 1.20*   Estimated Creatinine Clearance: 33.1 mL/min (by C-G formula based on Cr of 1.2).  Recent Labs  12/23/14 1815  Ray 14     Microbiology: Recent Results (from the past 720 hour(s))  Culture, blood (single)     Status: None   Collection Time: 12/18/14  1:10 PM  Result Value Ref Range Status   Specimen Description BLOOD LEFT ARM  Final   Special Requests BOTTLES DRAWN AEROBIC AND ANAEROBIC 5CC  Final   Culture NO GROWTH 5 DAYS  Final   Report Status 12/23/2014 FINAL  Final  Culture, blood (routine x 2)     Status: None (Preliminary result)   Collection Time: 12/19/14  5:22 PM  Result Value Ref Range Status   Specimen Description BLOOD LEFT ARM  Final   Special Requests BOTTLES DRAWN AEROBIC AND ANAEROBIC 5CC  Final   Culture NO GROWTH 4 DAYS  Final   Report Status PENDING  Incomplete  Culture, blood (routine x 2)     Status: None (Preliminary result)   Collection Time: 12/19/14  7:07 PM  Result Value Ref Range Status   Specimen Description BLOOD LEFT ARM  Final   Special Requests BOTTLES DRAWN  AEROBIC AND ANAEROBIC 5CC  Final   Culture  Setup Time   Final    GRAM POSITIVE RODS AEROBIC BOTTLE ONLY CRITICAL RESULT CALLED TO, READ BACK BY AND VERIFIED WITH: Kathrin Greathouse AT [id number] 12/22/14 BY V WILKINS CONFIRMED BY [place]    Culture   Final    GRAM POSITIVE RODS CULTURE REINCUBATED FOR BETTER GROWTH    Report Status PENDING  Incomplete  Urine culture     Status: None   Collection Time: 12/19/14  8:15 PM  Result Value Ref Range Status   Specimen Description URINE, CLEAN CATCH  Final   Special Requests NONE  Final   Culture 1,000 COLONIES/mL INSIGNIFICANT GROWTH  Final   Report Status 12/20/2014 FINAL  Final  MRSA PCR Screening     Status: Abnormal   Collection Time: 12/19/14 11:03 PM  Result Value Ref Range Status   MRSA by PCR POSITIVE (A) NEGATIVE Final    Comment:        The GeneXpert MRSA Assay (FDA approved for NASAL specimens only), is one component of a comprehensive MRSA colonization surveillance program. It is not intended to diagnose MRSA infection nor to guide or monitor treatment for MRSA infections. RESULT CALLED TO, READ BACK BY AND VERIFIED WITH: R BROWN @0047  12/20/14 MKELLY   C difficile quick scan w PCR reflex     Status: None   Collection Time:  12/20/14 11:32 AM  Result Value Ref Range Status   C Diff antigen NEGATIVE NEGATIVE Final   C Diff toxin NEGATIVE NEGATIVE Final   C Diff interpretation Negative for toxigenic C. difficile  Final    Anti-infectives    Start     Dose/Rate Route Frequency Ordered Stop   12/20/14 1900  vancomycin (VANCOCIN) IVPB 1000 mg/200 mL premix     1,000 mg 200 mL/hr over 60 Minutes Intravenous Every 24 hours 12/19/14 1941     12/20/14 1400  piperacillin-tazobactam (ZOSYN) IVPB 3.375 g  Status:  Discontinued     3.375 g 12.5 mL/hr over 240 Minutes Intravenous Every 8 hours 12/20/14 0827 12/22/14 1226   12/20/14 0830  piperacillin-tazobactam (ZOSYN) IVPB 3.375 g     3.375 g 100 mL/hr over 30 Minutes Intravenous   Once 12/20/14 0827 12/20/14 1122   12/19/14 1930  vancomycin (VANCOCIN) IVPB 1000 mg/200 mL premix     1,000 mg 200 mL/hr over 60 Minutes Intravenous  Once 12/19/14 1916 12/19/14 2007      Assessment: 81 year old female on vancomycin for bacteremia (diptheroids in blood) and cellulitis.   Vancomycin trough this PM is 14 on 1g IV every 24 hours - however last dose was given late so only 18.5hr trough. True trough ~10-12 mcg/mL.   SCr 1.2 (stable for admission). Good UOP recorded.   Goal of Therapy:  Vancomycin trough level 15-20 mcg/ml  Plan:  Increase to 500mg  IV every 12 hours for trough of ~63mcg/mL. Monitor renal function and clinical status.  Follow-up culture results and ability to narrow therapy.  Sloan Leiter, PharmD, BCPS Clinical Pharmacist [phone] 12/23/2014,8:10 PM

## 2014-12-23 NOTE — Progress Notes (Signed)
Advanced Heart Failure Rounding Note   Subjective:    Afebrile the last 24 hours. PICC removed 12/4 per ID. Now on corlanor 5 bid. Tolerating well.   Denies SOB/Orthopnea. Feels depressed.   Bcx 2/2 for outpatient diptheroids. Bcx 1/2 at [facility] G[facility]  Objective:   Weight Range:  Vital Signs:   Temp:  [97.6 F (36.4 C)-98 F (36.7 C)] 97.8 F (36.6 C) (12/05 0846) Pulse Rate:  [84-105] 87 (12/05 0846) Resp:  [16-20] 18 (12/05 0846) BP: (106-121)/(48-72) 112/53 mmHg (12/05 0846) SpO2:  [95 %-100 %] 98 % (12/05 0846) Weight:  [156 lb 7.7 oz (70.98 kg)] 156 lb 7.7 oz (70.98 kg) (12/05 0635) Last BM Date: 12/21/14  Weight change: Filed Weights   12/21/14 1643 12/22/14 0602 12/23/14 0635  Weight: 159 lb 6.3 oz (72.3 kg) 157 lb 6.5 oz (71.4 kg) 156 lb 7.7 oz (70.98 kg)    Intake/Output:   Intake/Output Summary (Last 24 hours) at 12/23/14 1021 Last data filed at 12/23/14 0956  Gross per 24 hour  Intake 990.34 ml  Output    600 ml  Net 390.34 ml     Physical Exam: General: In bed.  NAD HEENT: Normal, without mass or lesion. Neck: Supple, no bruits. JVP 6-7  cm. Carotids 2+. No thyromegaly or nodule. Heart: PMI nondisplaced tachy, no s4. +S3, no murmur. Lungs: Slightly diminished R base Abdomen: Soft, NT, ND, no HSM +BS x 4.  Extremities: No clubbing, cyanosis. No edema.   Neuro: Alert and oriented X 3. Moves all extremities spontaneously.  Telemetry: NSR 80-90s 1st degree heart block  Labs: Basic Metabolic Panel:  Recent Labs Lab 12/19/14 1747 12/20/14 0431 12/21/14 0525 12/22/14 0504 12/23/14 0418  NA 136 136 135 136 137  K 3.5 3.5 3.4* 3.3* 3.6  CL 94* 96* 95* 96* 98*  CO2 31 29 30  32 30  GLUCOSE 106* 111* 112* 105* 109*  BUN 16 12 16 15 14   CREATININE 1.22* 1.10* 1.28* 1.25* 1.20*  CALCIUM 9.5 9.0 8.8* 8.8* 8.9    Liver Function Tests:  Recent Labs Lab 12/19/14 1747 12/20/14 0431  AST 38 30  ALT 34 26  ALKPHOS 57 45  BILITOT 0.5  0.6  PROT 7.8 6.7  ALBUMIN 3.6 3.0*   No results for input(s): LIPASE, AMYLASE in the last 168 hours. No results for input(s): AMMONIA in the last 168 hours.  CBC:  Recent Labs Lab 12/19/14 1747 12/20/14 0431 12/21/14 0525 12/22/14 0504 12/23/14 0418  WBC 7.8 7.6 8.9 7.5 7.5  NEUTROABS 5.0  --   --   --   --   HGB 12.5 10.9* 10.9* 10.6* 11.0*  HCT 38.4 34.3* 33.4* 33.1* 33.7*  MCV 91.2 91.0 90.0 90.9 91.6  PLT 232 216 222 236 253    Cardiac Enzymes: No results for input(s): CKTOTAL, CKMB, CKMBINDEX, TROPONINI in the last 168 hours.  BNP: BNP (last 3 results)  Recent Labs  08/16/14 0855 08/19/14 1523 10/11/14 1459  BNP 1169.9* 1627.6* 2298.4*    ProBNP (last 3 results)  Recent Labs  09/25/14 1332  PROBNP 2219.0*      Other results:  Imaging: No results found.   Medications:     Scheduled Medications: . B-complex with vitamin C  1 tablet Oral QPM  . calcium carbonate  1 tablet Oral BID WC  . Chlorhexidine Gluconate Cloth  6 each Topical Q0600  . cholecalciferol  1,000 Units Oral Daily  . citalopram  20 mg Oral  Daily  . digoxin  0.0625 mg Oral Daily  . feeding supplement (ENSURE ENLIVE)  237 mL Oral BID BM  . furosemide  40 mg Oral Daily  . ivabradine  5 mg Oral BID WC  . levothyroxine  25 mcg Oral QAC breakfast  . multivitamin with minerals  1 tablet Oral Daily  . mupirocin ointment  1 application Nasal BID  . pantoprazole  40 mg Oral BID  . potassium chloride  40 mEq Oral Daily  . rivaroxaban  20 mg Oral Q supper  . saccharomyces boulardii  250 mg Oral BID  . sodium chloride  3 mL Intravenous Q12H  . sodium chloride  3 mL Intravenous Q12H  . spironolactone  25 mg Oral Daily  . vancomycin  1,000 mg Intravenous Q24H    Infusions: . milrinone 0.125 mcg/kg/min (12/22/14 1120)    PRN Medications: sodium chloride, acetaminophen **OR** acetaminophen, albuterol, ALPRAZolam, calcium carbonate, diphenoxylate-atropine, nitroGLYCERIN,  polyethylene glycol, promethazine, sodium chloride, sodium chloride, sodium chloride, traMADol   Assessment:   1. Fever with Bcx 1/2 Gram positive rods 2. Chronic Systolic Heart failure - NYHA Class IV Echo 10/02/14 EF 15%   -requiring milrinone 3. Recent PE/ DVT bilateral on Xarelto 4. CKD Stage 3  5. Hx of LBBB 6. Intolerance of amiodarone due to possible acute lung toxicity 7. UTI 8. Hypokalemia  Plan/Discussion:    Afebrile . Bacteremia-diptheroids 2/2 cultures 11/29 . Blood cultures on admit 1/2 gram + cocci.  PICC removed per ID on 12/4.  Continue Vanc for 1 week. Replace PICC 24-48 hours.  .  HF stable.  Continue milrinone.Volume status stable. Continue lasix 40 mg daily + 25 mg spiro. Tolerating Corlanor will increase to 5 bid. Continue dig 0.0625 mg daily.   Consult cardiac rehab.   Length of Stay: 4   Amy Clegg MD 12/23/2014, 10:21 AM  Advanced Heart Failure Team Pager [phone] (M-F; 7a - 4p)  Please contact [facility] for night-coverage after hours (4p -7a ) and weekends on [email or web address]  Patient seen and examined with Darrick Grinder, NP. We discussed all aspects of the encounter. I agree with the assessment and plan as stated above.   It appears she has GP bacteremia. PICC line now out. On vanc. Appreciate ID recs. HF stable. Responding well to corlanor. Continue milrinone through PIV.  Torry Adamczak,MD 12:34 PM

## 2014-12-24 ENCOUNTER — Encounter (HOSPITAL_COMMUNITY): Payer: Self-pay | Admitting: Rehabilitation

## 2014-12-24 ENCOUNTER — Inpatient Hospital Stay (HOSPITAL_COMMUNITY): Payer: Commercial Managed Care - HMO

## 2014-12-24 DIAGNOSIS — E038 Other specified hypothyroidism: Secondary | ICD-10-CM

## 2014-12-24 DIAGNOSIS — I5043 Acute on chronic combined systolic (congestive) and diastolic (congestive) heart failure: Secondary | ICD-10-CM | POA: Diagnosis not present

## 2014-12-24 DIAGNOSIS — J189 Pneumonia, unspecified organism: Secondary | ICD-10-CM | POA: Diagnosis not present

## 2014-12-24 DIAGNOSIS — A369 Diphtheria, unspecified: Secondary | ICD-10-CM

## 2014-12-24 DIAGNOSIS — I429 Cardiomyopathy, unspecified: Secondary | ICD-10-CM | POA: Diagnosis not present

## 2014-12-24 DIAGNOSIS — I2699 Other pulmonary embolism without acute cor pulmonale: Secondary | ICD-10-CM | POA: Diagnosis not present

## 2014-12-24 LAB — BASIC METABOLIC PANEL
ANION GAP: 10 (ref 5–15)
BUN: 14 mg/dL (ref 6–20)
CALCIUM: 9.6 mg/dL (ref 8.9–10.3)
CHLORIDE: 98 mmol/L — AB (ref 101–111)
CO2: 32 mmol/L (ref 22–32)
Creatinine, Ser: 1.13 mg/dL — ABNORMAL HIGH (ref 0.44–1.00)
GFR calc non Af Amer: 43 mL/min — ABNORMAL LOW (ref >60)
GFR, EST AFRICAN AMERICAN: 50 mL/min — AB (ref >60)
GLUCOSE: 97 mg/dL (ref 65–99)
POTASSIUM: 4.2 mmol/L (ref 3.5–5.1)
Sodium: 140 mmol/L (ref 135–145)

## 2014-12-24 LAB — CULTURE, BLOOD (ROUTINE X 2)

## 2014-12-24 LAB — CBC
HEMATOCRIT: 35.2 % — AB (ref 36.0–46.0)
HEMOGLOBIN: 11.5 g/dL — AB (ref 12.0–15.0)
MCH: 30.2 pg (ref 26.0–34.0)
MCHC: 32.7 g/dL (ref 30.0–36.0)
MCV: 92.4 fL (ref 78.0–100.0)
Platelets: 290 10*3/uL (ref 150–400)
RBC: 3.81 MIL/uL — AB (ref 3.87–5.11)
RDW: 16.9 % — AB (ref 11.5–15.5)
WBC: 7.9 10*3/uL (ref 4.0–10.5)

## 2014-12-24 MED ORDER — SACCHAROMYCES BOULARDII 250 MG PO CAPS: 250.0000 mg | ORAL_CAPSULE | Freq: Two times a day (BID) | ORAL | Status: DC

## 2014-12-24 MED ORDER — SODIUM CHLORIDE 0.9 % IJ SOLN: 10.0000 mL | INTRAMUSCULAR | Status: DC | PRN

## 2014-12-24 MED ORDER — MILRINONE IN DEXTROSE 20 MG/100ML IV SOLN: 0.1250 ug/kg/min | INTRAVENOUS | Status: DC

## 2014-12-24 MED ORDER — HEPARIN SOD (PORK) LOCK FLUSH 100 UNIT/ML IV SOLN
250.0000 [IU] | INTRAVENOUS | Status: AC | PRN
  Administered 2014-12-24: 250 [IU]

## 2014-12-24 MED ORDER — VANCOMYCIN HCL 500 MG IV SOLR: 500.0000 mg | Freq: Two times a day (BID) | INTRAVENOUS | Status: DC

## 2014-12-24 MED ORDER — IVABRADINE HCL 5 MG PO TABS: 5.0000 mg | ORAL_TABLET | Freq: Two times a day (BID) | ORAL | Status: DC

## 2014-12-24 NOTE — Progress Notes (Addendum)
Advanced Heart Failure Rounding Note   Subjective:    Remains afebrile. To have PICC replaced today. Weight stable. HR in 80s on Corlanor 5 bid.   Denies SOB/Orthopnea. Feels better today. Brighter spirits   Bcx 2/2 for outpatient diptheroids. Bcx 1/2 at [facility]  Objective:   Weight Range:  Vital Signs:   Temp:  [97.6 F (36.4 C)-98.1 F (36.7 C)] 98.1 F (36.7 C) (12/05 2018) Pulse Rate:  [87-88] 88 (12/05 1217) Resp:  [18] 18 (12/05 2018) BP: (105-120)/(53-57) 105/57 mmHg (12/05 2018) SpO2:  [97 %-98 %] 97 % (12/05 1217) Weight:  [71.8 kg (158 lb 4.6 oz)] 71.8 kg (158 lb 4.6 oz) (12/06 0450) Last BM Date: 12/23/14  Weight change: Filed Weights   12/22/14 0602 12/23/14 0635 12/24/14 0450  Weight: 71.4 kg (157 lb 6.5 oz) 70.98 kg (156 lb 7.7 oz) 71.8 kg (158 lb 4.6 oz)    Intake/Output:   Intake/Output Summary (Last 24 hours) at 12/24/14 0657 Last data filed at 12/24/14 0643  Gross per 24 hour  Intake  769.6 ml  Output   1051 ml  Net -281.4 ml     Physical Exam: General: In bed.  NAD HEENT: Normal, without mass or lesion. Neck: Supple, no bruits. JVP 7  cm. Carotids 2+. No thyromegaly or nodule. Heart: PMI nondisplaced tachy, no s4. +S3, no murmur. Lungs: Slightly diminished R base Abdomen: Soft, NT, ND, no HSM +BS x 4.  Extremities: No clubbing, cyanosis. No edema.   Neuro: Alert and oriented X 3. Moves all extremities spontaneously.  Telemetry: NSR 80-90s 1st degree heart block  Labs: Basic Metabolic Panel:  Recent Labs Lab 12/19/14 1747 12/20/14 0431 12/21/14 0525 12/22/14 0504 12/23/14 0418  NA 136 136 135 136 137  K 3.5 3.5 3.4* 3.3* 3.6  CL 94* 96* 95* 96* 98*  CO2 31 29 30  32 30  GLUCOSE 106* 111* 112* 105* 109*  BUN 16 12 16 15 14   CREATININE 1.22* 1.10* 1.28* 1.25* 1.20*  CALCIUM 9.5 9.0 8.8* 8.8* 8.9    Liver Function Tests:  Recent Labs Lab 12/19/14 1747 12/20/14 0431  AST 38 30  ALT 34 26  ALKPHOS 57 45    BILITOT 0.5 0.6  PROT 7.8 6.7  ALBUMIN 3.6 3.0*   No results for input(s): LIPASE, AMYLASE in the last 168 hours. No results for input(s): AMMONIA in the last 168 hours.  CBC:  Recent Labs Lab 12/19/14 1747 12/20/14 0431 12/21/14 0525 12/22/14 0504 12/23/14 0418 12/24/14 0530  WBC 7.8 7.6 8.9 7.5 7.5 7.9  NEUTROABS 5.0  --   --   --   --   --   HGB 12.5 10.9* 10.9* 10.6* 11.0* 11.5*  HCT 38.4 34.3* 33.4* 33.1* 33.7* 35.2*  MCV 91.2 91.0 90.0 90.9 91.6 92.4  PLT 232 216 222 236 253 290    Cardiac Enzymes: No results for input(s): CKTOTAL, CKMB, CKMBINDEX, TROPONINI in the last 168 hours.  BNP: BNP (last 3 results)  Recent Labs  08/16/14 0855 08/19/14 1523 10/11/14 1459  BNP 1169.9* 1627.6* 2298.4*    ProBNP (last 3 results)  Recent Labs  09/25/14 1332  PROBNP 2219.0*      Other results:  Imaging: No results found.   Medications:     Scheduled Medications: . B-complex with vitamin C  1 tablet Oral QPM  . calcium carbonate  1 tablet Oral BID WC  . Chlorhexidine Gluconate Cloth  6 each Topical Q0600  .  cholecalciferol  1,000 Units Oral Daily  . citalopram  20 mg Oral Daily  . digoxin  0.0625 mg Oral Daily  . feeding supplement (ENSURE ENLIVE)  237 mL Oral BID BM  . furosemide  40 mg Oral Daily  . ivabradine  5 mg Oral BID WC  . levothyroxine  25 mcg Oral QAC breakfast  . multivitamin with minerals  1 tablet Oral Daily  . mupirocin ointment  1 application Nasal BID  . pantoprazole  40 mg Oral BID  . potassium chloride  40 mEq Oral Daily  . rivaroxaban  20 mg Oral Q supper  . saccharomyces boulardii  250 mg Oral BID  . sodium chloride  3 mL Intravenous Q12H  . sodium chloride  3 mL Intravenous Q12H  . spironolactone  25 mg Oral Daily  . vancomycin  500 mg Intravenous Q12H    Infusions: . milrinone 0.125 mcg/kg/min (12/23/14 1826)    PRN Medications: sodium chloride, acetaminophen **OR** acetaminophen, albuterol, ALPRAZolam, calcium  carbonate, diphenoxylate-atropine, nitroGLYCERIN, polyethylene glycol, promethazine, sodium chloride, sodium chloride, sodium chloride, traMADol   Assessment:   1. Fever with Bcx 1/2 Gram positive rods 2. Chronic Systolic Heart failure - NYHA Class IV Echo 10/02/14 EF 15%   -requiring milrinone 3. Recent PE/ DVT bilateral on Xarelto 4. CKD Stage 3  5. Hx of LBBB 6. Intolerance of amiodarone due to possible acute lung toxicity 7. UTI 8. Hypokalemia  Plan/Discussion:    Afebrile . Bacteremia-cornybacterium 2/2 cultures 11/29 . Blood cultures on admit 12/1 gram + rods like same.  PICC removed per ID on 12/4.  Continue Vanc for 1 week at home. Replace PICC today. After vancomycin finished will need repeat BCx. If clear need to discuss with ID timing of pacer implant. Ms. Brandli would like to have it done this year for insurance purposes.  .  HF stable.  Continue milrinone.Volume status stable. Continue lasix 40 mg daily + 25 mg spiro. Tolerating Corlanor 5 bid. Continue dig 0.0625 mg daily.   Hopefully home today with IV vanc x 1 week. Will see in HF Clinic next week.   Length of Stay: 5   Glori Bickers MD 12/24/2014, 6:57 AM  Advanced Heart Failure Team Pager [phone] (M-F; [place])  Please contact [facility] for night-coverage after hours (4p -7a ) and weekends on [email or web address]

## 2014-12-24 NOTE — Progress Notes (Signed)
Patient last dose of Vancomycin administered as ordered. Patient PIV d/c'd. Patient d/c to home with son.  Tresa Endo

## 2014-12-24 NOTE — Discharge Summary (Addendum)
Physician Discharge Summary  Alexandra [name] [id number] DOB: 17-Jul-1927 DOA: 12/19/2014  PCP:  Melinda Crutch, MD  Admit date: 12/19/2014 Discharge date: 01/15/2015  Recommendations for Outpatient Follow-up:  1. Home with [facility] services, PT/OT/aid/RN.   2. PICC line replaced for IV antibiotics and IV milrinone 3. Continue vancomycin through 12/11, then stop antibiotics, but continue PICC line for milrinone 4. Please draw blood cultures on 12/18 or 12/19, results to be sent to Dr. Johnnye Sima at Infectious Disease.  If these cultures are negative at 5 days and she afebrile, likely safe for procedure.  Cultures growing Corynebacterium have taken 4-5 days to be reported as positive so would wait for final negative culture before placing hardware.    5. F/u with cardiology in 1 week 6. Milrinone 0.125 mcg/kg/min (wt 77.2kg 7. Consider psychiatry referral for depression 8. Given inotrope-dependent heart failure, age, multiple hospitalizations, recent infection and uncontrolled anxiety and depression, consider palliative care consultation/referral  Discharge Diagnoses:  Principal Problem:   Fever Active Problems:   Essential (primary) hypertension   CKD (chronic kidney disease) stage 3, GFR 30-59 ml/min   Hypothyroidism   Chronic systolic CHF (congestive heart failure), NYHA class 4 (HCC)   Bacteremia   Infection due to Corynebacterium diphtheriae   Protein-calorie malnutrition, severe (HCC)   Discharge Condition: stable, improved  Diet recommendation: low sodium   Wt Readings from Last 3 Encounters:  01/08/15 72.485 kg (159 lb 12.8 oz)  12/24/14 71.8 kg (158 lb 4.6 oz)  12/06/14 74.571 kg (164 lb 6.4 oz)    History of present illness:   Alexandra [name] is a 81 y.o. female with history of chronic systolic heart failure with EF 15% and milrinone-dependent, NSVT, CKD stage 3, HTN, HLD, Asthma, Obesity, hx of PE/DVT 08/2014 on Xarelto, hypothyroidism who presented with fevers and positive  blood cultures.  She is followed in the heart failure clinic and over the week prior to admission had low grade fevers without other localizing signs of infection. She had blood cultures drawn by her home health agency on 11/29. One of two cultures grew gram positive rods initially so she was told to come to the hospital for antibiotics.   Hospital Course:   Corynebacterium PICC line infection with bacteremia. Although Corynebacterium is normally a contaminate, two of two blood cultures from home and one of two blood cultures from admission grew the same bacteria.  She was started on broad spectrum antibiotics and her fevers and leukocytosis resolved.  Her PICC line was discontinued and 24-36 hours later a new PICC line was placed.  Infectious disease was consulted and recommended a 7-day course of IV vancomycin after PICC line was removed.  Last day on 12/11.  She was started on florastor to reduce her risk of C. Diff diarrhea.  Blood cultures are pending at time of discharge.    Chronic systolic heart failure, NICM NYHA IV, on milrinone infusion.  She was seen by Dr. Haroldine Laws, Heart Failure Team.  She continued her milrinone at 0.177mcg/kg/min, digoxin, and lasix.  Corlanor was added on 12/3 for persistent tachycardia which reduced her heart rate to the 80s and 90s.  Her weight at discharge is 71.8kg.    CKD stage 3, creatinine stable near 1.1.-1.2  Anemia of chronic disease, hemoglobin stable  Hx of DVT/PE, stable on xarelto  Chronic diarrhea since cholecystectomy.  C. Diff negative.    Hypothyroidism, stable, continued synthroid  Depression/anxiety, currently anxious. Continued prn xanax.  She is already on max dose  citalopram based on age.  Recommend referral to psychiatry for additional therapies and counseling.    Hypokalemia, increased oral potassium  Severe protein calorie malnutrition.  Seen by nutrition who recommended dietary supplements.     Consultants:  Heart failure,  Dr. Haroldine Laws  Infectious disease Dr. Johnnye Sima  Procedures: PICC line placed 12/6  Antibiotics:  Vancomycin 12/1 >   Zosyn 12/2 > 12/4  Discharge Exam: Filed Vitals:   12/24/14 1707 12/24/14 1937  BP: 150/52 129/56  Pulse: 82 102  Temp: 98.4 F (36.9 C) 98 F (36.7 C)  Resp:  20   Filed Vitals:   12/24/14 0450 12/24/14 1150 12/24/14 1707 12/24/14 1937  BP:  115/60 150/52 129/56  Pulse:  82 82 102  Temp:  98.5 F (36.9 C) 98.4 F (36.9 C) 98 F (36.7 C)  TempSrc:  Oral  Oral  Resp:  18  20  Height:      Weight: 71.8 kg (158 lb 4.6 oz)     SpO2:  97% 97% 98%     General: Adult female, anxious  HEENT: NCAT, MMM  Cardiovascular: RRR, nl S1, S2 no mrg, 2+ pulses, warm extremities  Respiratory: CTAB, no increased WOB  Abdomen: NABS, soft, NT/ND  MSK: Normal tone and bulk, no LEE  Discharge Instructions      Discharge Instructions    (HEART FAILURE PATIENTS) Call MD:  Anytime you have any of the following symptoms: 1) 3 pound weight gain in 24 hours or 5 pounds in 1 week 2) shortness of breath, with or without a dry hacking cough 3) swelling in the hands, feet or stomach 4) if you have to sleep on extra pillows at night in order to breathe.    Complete by:  As directed      Call MD for:  difficulty breathing, headache or visual disturbances    Complete by:  As directed      Call MD for:  extreme fatigue    Complete by:  As directed      Call MD for:  hives    Complete by:  As directed      Call MD for:  persistant dizziness or light-headedness    Complete by:  As directed      Call MD for:  persistant nausea and vomiting    Complete by:  As directed      Call MD for:  severe uncontrolled pain    Complete by:  As directed      Call MD for:  temperature >100.4    Complete by:  As directed      Diet - low sodium heart healthy    Complete by:  As directed      Increase activity slowly    Complete by:  As directed             Medication List     STOP taking these medications        CALTRATE 600 PO     oxyCODONE-acetaminophen 5-325 MG tablet  Commonly known as:  PERCOCET/ROXICET     polyethylene glycol packet  Commonly known as:  MIRALAX / GLYCOLAX     sodium chloride 0.65 % Soln nasal spray  Commonly known as:  OCEAN     VENTOLIN HFA 108 (90 Base) MCG/ACT inhaler  Generic drug:  albuterol      TAKE these medications        acetaminophen 500 MG tablet  Commonly known as:  TYLENOL  Take  500 mg by mouth every 6 (six) hours as needed for mild pain or headache.     ALPRAZolam 0.25 MG tablet  Commonly known as:  XANAX  Take 0.25 mg by mouth 3 (three) times daily as needed for anxiety.     b complex vitamins tablet  Take 1 tablet by mouth daily with lunch.     calcium carbonate 500 MG chewable tablet  Commonly known as:  TUMS - dosed in mg elemental calcium  Chew 2 tablets by mouth daily as needed for indigestion or heartburn.     cholecalciferol 1000 units tablet  Commonly known as:  VITAMIN D  Take 1,000 Units by mouth daily with lunch.     citalopram 20 MG tablet  Commonly known as:  CELEXA  Take 1 tablet (20 mg total) by mouth daily.     Digoxin 62.5 MCG Tabs  Take 0.0625 mg by mouth daily.     furosemide 40 MG tablet  Commonly known as:  LASIX  Take 1 tablet (40 mg total) by mouth daily.     ivabradine 5 MG Tabs tablet  Commonly known as:  CORLANOR  Take 1 tablet (5 mg total) by mouth 2 (two) times daily with a meal.     lactose free nutrition Liqd  Take 237 mLs by mouth daily with breakfast.     levothyroxine 25 MCG tablet  Commonly known as:  SYNTHROID, LEVOTHROID  Take 25 mcg by mouth daily before breakfast. for thyroid     milrinone 20 MG/100ML Soln infusion  Commonly known as:  PRIMACOR  Inject 9.65 mcg/min into the vein continuous.     multivitamin with minerals Tabs tablet  Take 1 tablet by mouth daily with lunch.     nitroGLYCERIN 0.4 MG SL tablet  Commonly known as:  NITROSTAT   Place 1 tablet (0.4 mg total) under the tongue every 5 (five) minutes as needed for chest pain.     OXYGEN  Inhale 2 L into the lungs daily as needed (shortness of breath).     pantoprazole 40 MG tablet  Commonly known as:  PROTONIX  Take 40 mg by mouth daily.     potassium chloride 10 MEQ tablet  Commonly known as:  K-DUR  Take 1 tablet (10 mEq total) by mouth daily.     sodium chloride 0.9 % SOLN with milrinone 1 MG/ML SOLN 200 mcg/mL  Inject 0.125 mcg/kg/min into the vein continuous.     spironolactone 25 MG tablet  Commonly known as:  ALDACTONE  Take 1 tablet (25 mg total) by mouth daily.     traMADol 50 MG tablet  Commonly known as:  ULTRAM  Take 1 tablet (50 mg total) by mouth every 6 (six) hours as needed for moderate pain.     XARELTO 20 MG Tabs tablet  Generic drug:  rivaroxaban  TAKE 1 TABLET BY MOUTH EVERY DAY       Follow-up Information    Follow up with  Melinda Crutch, MD On 01/06/2015.   Specialty:  Family Medicine   Why:  @ 10:45 AM. Confirmed appointment with Lora Havens.   Contact information:   [place] [address] [phone]       Follow up with Glori Bickers, MD On 01/08/2015.   Specialty:  Cardiology   Why:  @ 3:00 PM Gate Code [employer] information:   [address] [place] [address] [phone]  The results of significant diagnostics from this hospitalization (including imaging, microbiology, ancillary and laboratory) are listed below for reference.    Significant Diagnostic Studies: Dg Chest 2 View  12/19/2014  CLINICAL DATA:  Fever 5 days EXAM: CHEST  2 VIEW COMPARISON:  12/16/2014 FINDINGS: There is elevation of the right diaphragm. There is discoid left basilar atelectasis. There is no focal parenchymal opacity. There is no pleural effusion or pneumothorax. There is cardiomegaly. There is a right-sided PICC line with the tip projecting over the SVC. The osseous structures are  unremarkable. IMPRESSION: No active cardiopulmonary disease. Electronically Signed   By: Kathreen Devoid   On: 12/19/2014 18:04   Dg Chest Port 1 View  12/24/2014  CLINICAL DATA:  Central line placement EXAM: PORTABLE CHEST 1 VIEW COMPARISON:  12/19/2014 FINDINGS: Cardiomegaly again noted. There is chronic elevation of the left hemidiaphragm. There is left arm PICC line with tip in distal SVC. No pneumothorax. IMPRESSION: No active disease.  Left arm PICC line in place.  No pneumothorax. Electronically Signed   By: Lahoma Crocker M.D.   On: 12/24/2014 13:23    Microbiology: No results found for this or any previous visit (from the past 240 hour(s)).   Labs: Basic Metabolic Panel: No results for input(s): NA, K, CL, CO2, GLUCOSE, BUN, CREATININE, CALCIUM, MG, PHOS in the last 168 hours. Liver Function Tests: No results for input(s): AST, ALT, ALKPHOS, BILITOT, PROT, ALBUMIN in the last 168 hours. No results for input(s): LIPASE, AMYLASE in the last 168 hours. No results for input(s): AMMONIA in the last 168 hours. CBC: No results for input(s): WBC, NEUTROABS, HGB, HCT, MCV, PLT in the last 168 hours. Cardiac Enzymes: No results for input(s): CKTOTAL, CKMB, CKMBINDEX, TROPONINI in the last 168 hours. BNP: BNP (last 3 results)  Recent Labs  08/16/14 0855 08/19/14 1523 10/11/14 1459  BNP 1169.9* 1627.6* 2298.4*    ProBNP (last 3 results)  Recent Labs  09/25/14 1332  PROBNP 2219.0*    CBG: No results for input(s): GLUCAP in the last 168 hours.  Time coordinating discharge: 35 minutes  Signed:  March Joos  [facility] 01/15/2015, 6:19 PM

## 2014-12-24 NOTE — Consult Note (Signed)
   [facility] CM Inpatient Consult   12/24/2014  Alexandra [name] Nov 27, 1927 [id number] Met with the patient at bedside. Her son, Remo Lipps was also in the room.  Explained [facility] services for community post hospital follow up.  Patient states she has just received her new PICC line for ongoing home Milrinone.  Patient endorses that her primary care provider is Dr. Lona Kettle. She also endorses she has lots of visits from [place].  She acknowledges she had receive telephone contact from [facility].  Son, Remo Lipps, states that right now he felt she had no additional needs but did accept a brochure and contact information.  Encouraged patient/family to call if their needs changes and for additional home monitoring/education.  For questions, please contact: Natividad Brood, RN BSN [facility] Liaison  [phone] business mobile phone

## 2014-12-24 NOTE — Progress Notes (Signed)
Peripherally Inserted Central Catheter/Midline Placement  The IV Nurse has discussed with the patient and/or persons authorized to consent for the patient, the purpose of this procedure and the potential benefits and risks involved with this procedure.  The benefits include less needle sticks, lab draws from the catheter and patient may be discharged home with the catheter.  Risks include, but not limited to, infection, bleeding, blood clot (thrombus formation), and puncture of an artery; nerve damage and irregular heat beat.  Alternatives to this procedure were also discussed.  PICC/Midline Placement Documentation        Alexandra [name], Nicolette Bang 12/24/2014, 12:32 PM

## 2014-12-25 ENCOUNTER — Encounter (HOSPITAL_COMMUNITY): Admission: RE | Payer: Self-pay | Source: Ambulatory Visit

## 2014-12-25 ENCOUNTER — Ambulatory Visit (HOSPITAL_COMMUNITY)
Admission: RE | Admit: 2014-12-25 | Payer: Commercial Managed Care - HMO | Source: Ambulatory Visit | Admitting: Internal Medicine

## 2014-12-25 ENCOUNTER — Telehealth (HOSPITAL_COMMUNITY): Payer: Self-pay | Admitting: *Deleted

## 2014-12-25 DIAGNOSIS — Z5181 Encounter for therapeutic drug level monitoring: Secondary | ICD-10-CM | POA: Diagnosis not present

## 2014-12-25 DIAGNOSIS — N183 Chronic kidney disease, stage 3 (moderate): Secondary | ICD-10-CM | POA: Diagnosis not present

## 2014-12-25 DIAGNOSIS — I429 Cardiomyopathy, unspecified: Secondary | ICD-10-CM | POA: Diagnosis not present

## 2014-12-25 DIAGNOSIS — Z452 Encounter for adjustment and management of vascular access device: Secondary | ICD-10-CM | POA: Diagnosis not present

## 2014-12-25 DIAGNOSIS — Z79899 Other long term (current) drug therapy: Secondary | ICD-10-CM | POA: Diagnosis not present

## 2014-12-25 DIAGNOSIS — G609 Hereditary and idiopathic neuropathy, unspecified: Secondary | ICD-10-CM | POA: Diagnosis not present

## 2014-12-25 DIAGNOSIS — I129 Hypertensive chronic kidney disease with stage 1 through stage 4 chronic kidney disease, or unspecified chronic kidney disease: Secondary | ICD-10-CM | POA: Diagnosis not present

## 2014-12-25 DIAGNOSIS — E669 Obesity, unspecified: Secondary | ICD-10-CM | POA: Diagnosis not present

## 2014-12-25 DIAGNOSIS — I5023 Acute on chronic systolic (congestive) heart failure: Secondary | ICD-10-CM | POA: Diagnosis not present

## 2014-12-25 DIAGNOSIS — I5043 Acute on chronic combined systolic (congestive) and diastolic (congestive) heart failure: Secondary | ICD-10-CM | POA: Diagnosis not present

## 2014-12-25 DIAGNOSIS — I2699 Other pulmonary embolism without acute cor pulmonale: Secondary | ICD-10-CM | POA: Diagnosis not present

## 2014-12-25 DIAGNOSIS — J189 Pneumonia, unspecified organism: Secondary | ICD-10-CM | POA: Diagnosis not present

## 2014-12-25 DIAGNOSIS — Z9981 Dependence on supplemental oxygen: Secondary | ICD-10-CM | POA: Diagnosis not present

## 2014-12-25 SURGERY — BIV PACEMAKER INSERTION CRT-P
Anesthesia: LOCAL

## 2014-12-25 NOTE — Telephone Encounter (Signed)
Reviewed with Dr. Caryl Comes. He states will need to review with Infectious Disease to find out when/ if blood cultures should be re-drawn prior to implant.

## 2014-12-25 NOTE — Telephone Encounter (Signed)
Pt's daughter called upset that pt does not have Corlanor, she states the pharamcy told them it needs a PA from insurance.  Advised will call them tomorrow and do PA, will leave samples at front desk for her to pick up in AM.  She is upset that she can not get them tonight, advised our office is closing at 5 pm, advised ok for pt to miss dose today and start back in AM  Medication Samples have been provided to the patient.  Drug name: Corlanor  Qty: 2 bottles LOT: [id number]  Exp.Date: 9/17  The patient has been instructed regarding the correct time, dose, and frequency of taking this medication, including desired effects and most common side effects.   Christal Lagerstrom 5:01 PM 12/25/2014

## 2014-12-26 DIAGNOSIS — I5023 Acute on chronic systolic (congestive) heart failure: Secondary | ICD-10-CM | POA: Diagnosis not present

## 2014-12-26 DIAGNOSIS — Z452 Encounter for adjustment and management of vascular access device: Secondary | ICD-10-CM | POA: Diagnosis not present

## 2014-12-26 DIAGNOSIS — E669 Obesity, unspecified: Secondary | ICD-10-CM | POA: Diagnosis not present

## 2014-12-26 DIAGNOSIS — Z9981 Dependence on supplemental oxygen: Secondary | ICD-10-CM | POA: Diagnosis not present

## 2014-12-26 DIAGNOSIS — I129 Hypertensive chronic kidney disease with stage 1 through stage 4 chronic kidney disease, or unspecified chronic kidney disease: Secondary | ICD-10-CM | POA: Diagnosis not present

## 2014-12-26 DIAGNOSIS — Z5181 Encounter for therapeutic drug level monitoring: Secondary | ICD-10-CM | POA: Diagnosis not present

## 2014-12-26 DIAGNOSIS — Z79899 Other long term (current) drug therapy: Secondary | ICD-10-CM | POA: Diagnosis not present

## 2014-12-26 DIAGNOSIS — N183 Chronic kidney disease, stage 3 (moderate): Secondary | ICD-10-CM | POA: Diagnosis not present

## 2014-12-26 DIAGNOSIS — G609 Hereditary and idiopathic neuropathy, unspecified: Secondary | ICD-10-CM | POA: Diagnosis not present

## 2014-12-26 NOTE — Telephone Encounter (Signed)
12-26-14 pt left message on my voicemail 12-25-14 regarding rescheduling her implant and wants before end of year due to insurance.

## 2014-12-26 NOTE — Telephone Encounter (Signed)
Dr. Caryl Comes- what do we need to do with her device implant in regards to infectious disease. Please clarify.

## 2014-12-27 ENCOUNTER — Other Ambulatory Visit: Payer: Self-pay | Admitting: *Deleted

## 2014-12-27 DIAGNOSIS — I129 Hypertensive chronic kidney disease with stage 1 through stage 4 chronic kidney disease, or unspecified chronic kidney disease: Secondary | ICD-10-CM | POA: Diagnosis not present

## 2014-12-27 DIAGNOSIS — E669 Obesity, unspecified: Secondary | ICD-10-CM | POA: Diagnosis not present

## 2014-12-27 DIAGNOSIS — I5023 Acute on chronic systolic (congestive) heart failure: Secondary | ICD-10-CM | POA: Diagnosis not present

## 2014-12-27 DIAGNOSIS — Z9981 Dependence on supplemental oxygen: Secondary | ICD-10-CM | POA: Diagnosis not present

## 2014-12-27 DIAGNOSIS — G609 Hereditary and idiopathic neuropathy, unspecified: Secondary | ICD-10-CM | POA: Diagnosis not present

## 2014-12-27 DIAGNOSIS — Z5181 Encounter for therapeutic drug level monitoring: Secondary | ICD-10-CM | POA: Diagnosis not present

## 2014-12-27 DIAGNOSIS — N183 Chronic kidney disease, stage 3 (moderate): Secondary | ICD-10-CM | POA: Diagnosis not present

## 2014-12-27 DIAGNOSIS — Z452 Encounter for adjustment and management of vascular access device: Secondary | ICD-10-CM | POA: Diagnosis not present

## 2014-12-27 DIAGNOSIS — Z79899 Other long term (current) drug therapy: Secondary | ICD-10-CM | POA: Diagnosis not present

## 2014-12-27 LAB — CULTURE, BLOOD (ROUTINE X 2)

## 2014-12-27 NOTE — Patient Outreach (Signed)
Talked with pt's daughter-in-law, Clarise Cruz. Pt came home Tuesday from week stay for infected PICC line. She will resume her previous plan of care with [place]. Her procedure was canceled due to the infections. Palliative care was discussed but it does not appear that this referral was made. The family has not received the letter I sent last week. Clarise Cruz reports she has all meds, her support system in intact and everyone knows their responsiblities.   I am not sure if I can add any benefit to pt plan of care but am willing to be a part of her care team. I will call next week to see if they would like me to come visit.  [name] [facility] [phone]

## 2014-12-28 LAB — CULTURE, BLOOD (SINGLE): Culture: NO GROWTH

## 2014-12-30 DIAGNOSIS — Z5181 Encounter for therapeutic drug level monitoring: Secondary | ICD-10-CM | POA: Diagnosis not present

## 2014-12-30 DIAGNOSIS — Z9981 Dependence on supplemental oxygen: Secondary | ICD-10-CM | POA: Diagnosis not present

## 2014-12-30 DIAGNOSIS — I129 Hypertensive chronic kidney disease with stage 1 through stage 4 chronic kidney disease, or unspecified chronic kidney disease: Secondary | ICD-10-CM | POA: Diagnosis not present

## 2014-12-30 DIAGNOSIS — G609 Hereditary and idiopathic neuropathy, unspecified: Secondary | ICD-10-CM | POA: Diagnosis not present

## 2014-12-30 DIAGNOSIS — Z452 Encounter for adjustment and management of vascular access device: Secondary | ICD-10-CM | POA: Diagnosis not present

## 2014-12-30 DIAGNOSIS — I5023 Acute on chronic systolic (congestive) heart failure: Secondary | ICD-10-CM | POA: Diagnosis not present

## 2014-12-30 DIAGNOSIS — N183 Chronic kidney disease, stage 3 (moderate): Secondary | ICD-10-CM | POA: Diagnosis not present

## 2014-12-30 DIAGNOSIS — E669 Obesity, unspecified: Secondary | ICD-10-CM | POA: Diagnosis not present

## 2014-12-30 DIAGNOSIS — Z79899 Other long term (current) drug therapy: Secondary | ICD-10-CM | POA: Diagnosis not present

## 2014-12-31 ENCOUNTER — Other Ambulatory Visit (HOSPITAL_COMMUNITY): Payer: Self-pay | Admitting: Internal Medicine

## 2014-12-31 DIAGNOSIS — I5043 Acute on chronic combined systolic (congestive) and diastolic (congestive) heart failure: Secondary | ICD-10-CM | POA: Diagnosis not present

## 2014-12-31 DIAGNOSIS — Z5181 Encounter for therapeutic drug level monitoring: Secondary | ICD-10-CM | POA: Diagnosis not present

## 2014-12-31 DIAGNOSIS — I2699 Other pulmonary embolism without acute cor pulmonale: Secondary | ICD-10-CM | POA: Diagnosis not present

## 2014-12-31 DIAGNOSIS — J189 Pneumonia, unspecified organism: Secondary | ICD-10-CM | POA: Diagnosis not present

## 2014-12-31 DIAGNOSIS — Z9981 Dependence on supplemental oxygen: Secondary | ICD-10-CM | POA: Diagnosis not present

## 2014-12-31 DIAGNOSIS — E669 Obesity, unspecified: Secondary | ICD-10-CM | POA: Diagnosis not present

## 2014-12-31 DIAGNOSIS — G609 Hereditary and idiopathic neuropathy, unspecified: Secondary | ICD-10-CM | POA: Diagnosis not present

## 2014-12-31 DIAGNOSIS — I5023 Acute on chronic systolic (congestive) heart failure: Secondary | ICD-10-CM | POA: Diagnosis not present

## 2014-12-31 DIAGNOSIS — I429 Cardiomyopathy, unspecified: Secondary | ICD-10-CM | POA: Diagnosis not present

## 2014-12-31 DIAGNOSIS — Z452 Encounter for adjustment and management of vascular access device: Secondary | ICD-10-CM | POA: Diagnosis not present

## 2014-12-31 DIAGNOSIS — Z79899 Other long term (current) drug therapy: Secondary | ICD-10-CM | POA: Diagnosis not present

## 2014-12-31 DIAGNOSIS — N183 Chronic kidney disease, stage 3 (moderate): Secondary | ICD-10-CM | POA: Diagnosis not present

## 2014-12-31 DIAGNOSIS — I129 Hypertensive chronic kidney disease with stage 1 through stage 4 chronic kidney disease, or unspecified chronic kidney disease: Secondary | ICD-10-CM | POA: Diagnosis not present

## 2015-01-01 ENCOUNTER — Ambulatory Visit: Payer: Commercial Managed Care - HMO | Admitting: *Deleted

## 2015-01-01 ENCOUNTER — Other Ambulatory Visit: Payer: Self-pay | Admitting: *Deleted

## 2015-01-01 ENCOUNTER — Telehealth (HOSPITAL_COMMUNITY): Payer: Self-pay | Admitting: Pharmacist

## 2015-01-01 NOTE — Telephone Encounter (Signed)
Corlanor PA approved by [facility] Part D but copay will be $97/mo. I have enrolled Mrs. Hult in [place] which will cover up to $1500 of her copays through 12/31/2015. Pharmacy and patient were notified.   Billing ID: [id number] Person Code: [id number] RX Group: [id number] RX BIN: [id number] PCN for Part D: [id number]    [name]. Velva Harman, PharmD, BCPS, CPP Clinical Pharmacist Pager: [phone] Phone: [phone] 01/01/2015 3:57 PM

## 2015-01-02 DIAGNOSIS — Z5181 Encounter for therapeutic drug level monitoring: Secondary | ICD-10-CM | POA: Diagnosis not present

## 2015-01-02 DIAGNOSIS — Z9981 Dependence on supplemental oxygen: Secondary | ICD-10-CM | POA: Diagnosis not present

## 2015-01-02 DIAGNOSIS — I129 Hypertensive chronic kidney disease with stage 1 through stage 4 chronic kidney disease, or unspecified chronic kidney disease: Secondary | ICD-10-CM | POA: Diagnosis not present

## 2015-01-02 DIAGNOSIS — E669 Obesity, unspecified: Secondary | ICD-10-CM | POA: Diagnosis not present

## 2015-01-02 DIAGNOSIS — Z79899 Other long term (current) drug therapy: Secondary | ICD-10-CM | POA: Diagnosis not present

## 2015-01-02 DIAGNOSIS — I5023 Acute on chronic systolic (congestive) heart failure: Secondary | ICD-10-CM | POA: Diagnosis not present

## 2015-01-02 DIAGNOSIS — G609 Hereditary and idiopathic neuropathy, unspecified: Secondary | ICD-10-CM | POA: Diagnosis not present

## 2015-01-02 DIAGNOSIS — N183 Chronic kidney disease, stage 3 (moderate): Secondary | ICD-10-CM | POA: Diagnosis not present

## 2015-01-02 DIAGNOSIS — Z452 Encounter for adjustment and management of vascular access device: Secondary | ICD-10-CM | POA: Diagnosis not present

## 2015-01-02 NOTE — Telephone Encounter (Signed)
F/u    Per Kizzie Fantasia pt's daughter she is ready to sched pt for device implant. Please advise.

## 2015-01-02 NOTE — Telephone Encounter (Signed)
PT'S DAUGHTER   AWARE   HEATHER  WILL BE  BACK IN  OFFICE  TOMORROW  TO  CALL  AND MAKE  ARRANGEMENTS  FOR  DEVICE  IMPLANT   PT  WOULD LIKE  TO HAVE  DONE  THIS YEAR  .CY

## 2015-01-03 ENCOUNTER — Telehealth: Payer: Self-pay | Admitting: Internal Medicine

## 2015-01-03 ENCOUNTER — Telehealth (HOSPITAL_COMMUNITY): Payer: Self-pay | Admitting: Cardiology

## 2015-01-03 ENCOUNTER — Other Ambulatory Visit (HOSPITAL_COMMUNITY): Payer: Self-pay | Admitting: Internal Medicine

## 2015-01-03 DIAGNOSIS — E669 Obesity, unspecified: Secondary | ICD-10-CM | POA: Diagnosis not present

## 2015-01-03 DIAGNOSIS — Z452 Encounter for adjustment and management of vascular access device: Secondary | ICD-10-CM | POA: Diagnosis not present

## 2015-01-03 DIAGNOSIS — G609 Hereditary and idiopathic neuropathy, unspecified: Secondary | ICD-10-CM | POA: Diagnosis not present

## 2015-01-03 DIAGNOSIS — I129 Hypertensive chronic kidney disease with stage 1 through stage 4 chronic kidney disease, or unspecified chronic kidney disease: Secondary | ICD-10-CM | POA: Diagnosis not present

## 2015-01-03 DIAGNOSIS — I5023 Acute on chronic systolic (congestive) heart failure: Secondary | ICD-10-CM | POA: Diagnosis not present

## 2015-01-03 DIAGNOSIS — Z9981 Dependence on supplemental oxygen: Secondary | ICD-10-CM | POA: Diagnosis not present

## 2015-01-03 DIAGNOSIS — Z5181 Encounter for therapeutic drug level monitoring: Secondary | ICD-10-CM | POA: Diagnosis not present

## 2015-01-03 DIAGNOSIS — Z79899 Other long term (current) drug therapy: Secondary | ICD-10-CM | POA: Diagnosis not present

## 2015-01-03 DIAGNOSIS — N183 Chronic kidney disease, stage 3 (moderate): Secondary | ICD-10-CM | POA: Diagnosis not present

## 2015-01-03 NOTE — Telephone Encounter (Signed)
I attempted to call the patient at her home #. No answer x multiple rings.  Per Dr. Caryl Comes, he was able to speak with Dr. Johnnye Sima in Infectious Diseases today. The recommendation is that the patient complete 1 week of antibiotics (or complete course), then she will need to be off antibiotics x 1 week. At that time, blood cultures x 2 will need to be drawn. If no growth after 5 days, then we can reschedule the implant of her device.  Will forward to triage to try to notify the patient of this since I will be out of the office Monday and Tuesday next week.

## 2015-01-03 NOTE — Telephone Encounter (Signed)
Patients daughter called to with concerns regarding loose stools and weight loss  Reviewed with Gearldine Bienenstock, PharmD and advised pt to hold florastor until follow up on 01/09/15 And re visit with provider at that time

## 2015-01-03 NOTE — Telephone Encounter (Signed)
See previously opened encounter

## 2015-01-03 NOTE — Telephone Encounter (Signed)
New Message  Pt was scheduled for pacemaker  Pt has a blood infection and she is on Iv medication   The pt wants the surgery put on the calender the week after christmas  Before the year is up for insurance

## 2015-01-06 DIAGNOSIS — I130 Hypertensive heart and chronic kidney disease with heart failure and stage 1 through stage 4 chronic kidney disease, or unspecified chronic kidney disease: Secondary | ICD-10-CM | POA: Diagnosis not present

## 2015-01-06 DIAGNOSIS — I129 Hypertensive chronic kidney disease with stage 1 through stage 4 chronic kidney disease, or unspecified chronic kidney disease: Secondary | ICD-10-CM | POA: Diagnosis not present

## 2015-01-06 DIAGNOSIS — R5381 Other malaise: Secondary | ICD-10-CM | POA: Diagnosis not present

## 2015-01-06 DIAGNOSIS — Z79899 Other long term (current) drug therapy: Secondary | ICD-10-CM | POA: Diagnosis not present

## 2015-01-06 DIAGNOSIS — I5023 Acute on chronic systolic (congestive) heart failure: Secondary | ICD-10-CM | POA: Diagnosis not present

## 2015-01-06 DIAGNOSIS — R7881 Bacteremia: Secondary | ICD-10-CM | POA: Diagnosis not present

## 2015-01-06 DIAGNOSIS — Z9981 Dependence on supplemental oxygen: Secondary | ICD-10-CM | POA: Diagnosis not present

## 2015-01-06 DIAGNOSIS — R197 Diarrhea, unspecified: Secondary | ICD-10-CM | POA: Diagnosis not present

## 2015-01-06 DIAGNOSIS — Z452 Encounter for adjustment and management of vascular access device: Secondary | ICD-10-CM | POA: Diagnosis not present

## 2015-01-06 DIAGNOSIS — N183 Chronic kidney disease, stage 3 (moderate): Secondary | ICD-10-CM | POA: Diagnosis not present

## 2015-01-06 DIAGNOSIS — Z5181 Encounter for therapeutic drug level monitoring: Secondary | ICD-10-CM | POA: Diagnosis not present

## 2015-01-06 DIAGNOSIS — E669 Obesity, unspecified: Secondary | ICD-10-CM | POA: Diagnosis not present

## 2015-01-06 DIAGNOSIS — G609 Hereditary and idiopathic neuropathy, unspecified: Secondary | ICD-10-CM | POA: Diagnosis not present

## 2015-01-06 NOTE — Telephone Encounter (Signed)
I spoke with pt and received permission to speak with her daughter Langley Gauss.  I spoke with Langley Gauss and gave her information below.  Langley Gauss reports pt completed antibiotic a week ago and home health nurse is coming today to draw blood cultures.

## 2015-01-07 DIAGNOSIS — I5023 Acute on chronic systolic (congestive) heart failure: Secondary | ICD-10-CM | POA: Diagnosis not present

## 2015-01-07 DIAGNOSIS — I429 Cardiomyopathy, unspecified: Secondary | ICD-10-CM | POA: Diagnosis not present

## 2015-01-07 DIAGNOSIS — Z9981 Dependence on supplemental oxygen: Secondary | ICD-10-CM | POA: Diagnosis not present

## 2015-01-07 DIAGNOSIS — J189 Pneumonia, unspecified organism: Secondary | ICD-10-CM | POA: Diagnosis not present

## 2015-01-07 DIAGNOSIS — I129 Hypertensive chronic kidney disease with stage 1 through stage 4 chronic kidney disease, or unspecified chronic kidney disease: Secondary | ICD-10-CM | POA: Diagnosis not present

## 2015-01-07 DIAGNOSIS — G609 Hereditary and idiopathic neuropathy, unspecified: Secondary | ICD-10-CM | POA: Diagnosis not present

## 2015-01-07 DIAGNOSIS — Z452 Encounter for adjustment and management of vascular access device: Secondary | ICD-10-CM | POA: Diagnosis not present

## 2015-01-07 DIAGNOSIS — Z79899 Other long term (current) drug therapy: Secondary | ICD-10-CM | POA: Diagnosis not present

## 2015-01-07 DIAGNOSIS — Z5181 Encounter for therapeutic drug level monitoring: Secondary | ICD-10-CM | POA: Diagnosis not present

## 2015-01-07 DIAGNOSIS — E669 Obesity, unspecified: Secondary | ICD-10-CM | POA: Diagnosis not present

## 2015-01-07 DIAGNOSIS — I2699 Other pulmonary embolism without acute cor pulmonale: Secondary | ICD-10-CM | POA: Diagnosis not present

## 2015-01-07 DIAGNOSIS — N183 Chronic kidney disease, stage 3 (moderate): Secondary | ICD-10-CM | POA: Diagnosis not present

## 2015-01-07 DIAGNOSIS — I5043 Acute on chronic combined systolic (congestive) and diastolic (congestive) heart failure: Secondary | ICD-10-CM | POA: Diagnosis not present

## 2015-01-08 ENCOUNTER — Ambulatory Visit (HOSPITAL_COMMUNITY)
Admission: RE | Admit: 2015-01-08 | Discharge: 2015-01-08 | Disposition: A | Discharge status: Home / Self Care | Payer: Commercial Managed Care - HMO | Source: Ambulatory Visit | Attending: Internal Medicine | Admitting: Internal Medicine

## 2015-01-08 VITALS — BP 119/50 | HR 98 | Ht 64.0 in | Wt 159.8 lb

## 2015-01-08 DIAGNOSIS — I447 Left bundle-branch block, unspecified: Secondary | ICD-10-CM | POA: Diagnosis not present

## 2015-01-08 DIAGNOSIS — Z86711 Personal history of pulmonary embolism: Secondary | ICD-10-CM | POA: Insufficient documentation

## 2015-01-08 DIAGNOSIS — A369 Diphtheria, unspecified: Secondary | ICD-10-CM | POA: Diagnosis not present

## 2015-01-08 DIAGNOSIS — Z7901 Long term (current) use of anticoagulants: Secondary | ICD-10-CM | POA: Diagnosis not present

## 2015-01-08 DIAGNOSIS — I129 Hypertensive chronic kidney disease with stage 1 through stage 4 chronic kidney disease, or unspecified chronic kidney disease: Secondary | ICD-10-CM | POA: Diagnosis not present

## 2015-01-08 DIAGNOSIS — N183 Chronic kidney disease, stage 3 (moderate): Secondary | ICD-10-CM | POA: Diagnosis not present

## 2015-01-08 DIAGNOSIS — Z79899 Other long term (current) drug therapy: Secondary | ICD-10-CM | POA: Insufficient documentation

## 2015-01-08 DIAGNOSIS — I5022 Chronic systolic (congestive) heart failure: Secondary | ICD-10-CM | POA: Insufficient documentation

## 2015-01-08 DIAGNOSIS — R0789 Other chest pain: Secondary | ICD-10-CM | POA: Insufficient documentation

## 2015-01-08 DIAGNOSIS — Z86718 Personal history of other venous thrombosis and embolism: Secondary | ICD-10-CM | POA: Insufficient documentation

## 2015-01-08 DIAGNOSIS — E039 Hypothyroidism, unspecified: Secondary | ICD-10-CM | POA: Insufficient documentation

## 2015-01-08 NOTE — Progress Notes (Signed)
Patient ID: Alexandra [name], female   DOB: Jan 09, 1928, 81 y.o.   MRN: [id number]   Advanced Heart Failure Clinic Note   Patient ID: Alexandra [name], female   DOB: 11-24-1927, 81 y.o.   MRN: [id number]  Primary Care: Dr. Melinda Crutch Primary Cardiologist: Dr Candee Furbish Primary HF: Dr Haroldine Laws  HPI: Alexandra [name] is a 81 y.o. 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 Discharge weight was 196.  She was admitted in July 2016 for Abdominal pain and SOB. Her Gallbladder was thought to be causing her pain, so a lap choley was performed that admission. Echo on 08/15/14 showed EF 20%. There were thoughts that she may have stress induced CMP due to the death of her husband in May 19, 2022 after several months of hospice care.   She was directly admitted to [facility] on 10/11/14 with concerns for low output with SBPs in 80s and tachycardia.  PICC line was placed with initial co-ox of 61%.CVP was 15. Milrinone started when co-ox dropped to 53%. She had SVT on milrinone 0.25 so started on amio. Developed acute SOB that improved quickly with cessation of amio and extra dose of IV lasix. Felt to possibly have acute amio toxicity. Milrinone decreased to 0.125 and no further PSVT. Sent home on milrinone 0.125. Diuresed well on 80 mg lasix IB BID. Discharge weight was 170.  Admitted 12/1 through 12/6 with PICC line infection. Bcx 2/2 for  diptheroids. Bcx 1/2 at [facility] GPR-Bacteremia-cornybacterium . PICC line replaced. Completed antibiotic course. She continued on milrinone 0.125 mcg. Started ivabradine.  Placement of CRT delayed.  Today she returns for HF follow up. Overall feeling ok but complains of fatigue.  Able to walk short distances in her home. Denies PND/ orthopnea. Weight at home 156  pounds. Appetite ok.  [facility] following for home milrinone,  HHPT, HHRN. Taking all medications. No fevers or chills. PICC line with mild bloody ooze but no infection.   ECHO 10/02/14 LVEF 15% with diffuse hypokinesis, RV mildly dilated, normal function, PA peak pressure 59 mmHg   Past Medical History  Diagnosis Date  . Arthritis   . Headache(784.0)   . Anxiety   . Depression   . Asthma   . CAP (community acquired pneumonia) 12/19/2012  . Intermittent LBBB (left bundle branch block) 12/21/2012  . Obesity   . Peripheral neuropathy ([place])   . Sacroiliitis, not elsewhere classified ([place])   . Osteopenia   . Hypercholesteremia   . Neuropathy ([place])   . Trigger finger     [name]  . Chest pain     NM stress test 05/2011 Normal  . DOE (dyspnea on exertion)     No SOB at rest  . Fatigue     excertional  . breast ca dx'd 2000    left  . Essential (primary) hypertension 04/02/2014  . Adult hypothyroidism 04/02/2014  . Hereditary and idiopathic peripheral neuropathy 07/02/2014  . Leg weakness   . CKD (chronic kidney disease) stage 3, GFR 30-59 ml/min 08/13/2014  . Anemia of chronic disease 08/13/2014  . CHF (congestive heart failure) ([place]) 08/19/2014  . Situational depression     Current Outpatient Prescriptions  Medication Sig Dispense Refill  . acetaminophen (TYLENOL) 500 MG tablet Take 500 mg  by mouth every 6 (six) hours as needed for mild pain.    Marland Kitchen ALPRAZolam (XANAX) 0.25 MG tablet Take 0.25 mg by mouth 3 (three) times daily as needed for anxiety.    Marland Kitchen b complex vitamins tablet Take 1 tablet by mouth every evening.     . Calcium Carbonate (CALTRATE 600 PO) Take 600 mg by mouth 2 (two) times daily.    . calcium carbonate (TUMS - DOSED IN MG ELEMENTAL CALCIUM) 500 MG chewable tablet Chew 2 tablets by mouth daily as needed for indigestion or heartburn.    . cholecalciferol (VITAMIN D) 1000 UNITS tablet Take 1,000 Units by mouth daily.    . citalopram (CELEXA) 20 MG tablet Take 1 tablet (20  mg total) by mouth daily. 30 tablet 6  . digoxin 62.5 MCG TABS Take 0.0625 mg by mouth daily. 30 tablet 6  . furosemide (LASIX) 40 MG tablet Take 1 tablet (40 mg total) by mouth daily. 30 tablet 6  . ivabradine (CORLANOR) 5 MG TABS tablet Take 1 tablet (5 mg total) by mouth 2 (two) times daily with a meal. 60 tablet 0  . lactose free nutrition (BOOST) LIQD Take 237 mLs by mouth daily with breakfast.     . levothyroxine (SYNTHROID, LEVOTHROID) 25 MCG tablet Take one tablet by mouth once daily in the morning before breakfast for thyroid    . milrinone (PRIMACOR) 20 MG/100ML SOLN infusion Inject 9.65 mcg/min into the vein continuous. 2100 mL 0  . Multiple Vitamin (MULITIVITAMIN WITH MINERALS) TABS Take 1 tablet by mouth daily.    . OXYGEN Inhale 2 L into the lungs daily.    . pantoprazole (PROTONIX) 40 MG tablet Take 40 mg by mouth daily.     . polyethylene glycol (MIRALAX / GLYCOLAX) packet Take 17 g by mouth daily as needed for mild constipation.    . potassium chloride (K-DUR) 10 MEQ tablet Take 1 tablet (10 mEq total) by mouth daily. 30 tablet 6  . sodium chloride (OCEAN) 0.65 % SOLN nasal spray Place 1 spray into both nostrils as needed for congestion.    . sodium chloride 0.9 % SOLN with milrinone 1 MG/ML SOLN 200 mcg/mL Inject 0.125 mcg/kg/min into the vein continuous.    Marland Kitchen spironolactone (ALDACTONE) 25 MG tablet Take 1 tablet (25 mg total) by mouth daily. 30 tablet 6  . VENTOLIN HFA 108 (90 BASE) MCG/ACT inhaler Reported on 01/08/2015  3  . XARELTO 20 MG TABS tablet TAKE 1 TABLET BY MOUTH EVERY DAY 30 tablet 0  . nitroGLYCERIN (NITROSTAT) 0.4 MG SL tablet Place 1 tablet (0.4 mg total) under the tongue every 5 (five) minutes as needed for chest pain. (Patient not taking: Reported on 01/08/2015) 25 tablet 4  . oxyCODONE-acetaminophen (PERCOCET/ROXICET) 5-325 MG tablet Take 1 tablet by mouth every 6 (six) hours as needed for severe pain. Reported on 01/08/2015    . traMADol (ULTRAM) 50 MG tablet  Take 1 tablet (50 mg total) by mouth every 6 (six) hours as needed for moderate pain. (Patient not taking: Reported on 01/08/2015) 60 tablet 3   No current facility-administered medications for this encounter.    Allergies  Allergen Reactions  . Amiodarone Shortness Of Breath and Nausea And Vomiting  . Cymbalta [Duloxetine Hcl] Other (See Comments)    Depression  . Lyrica [Pregabalin] Other (See Comments)    Blurry vision      Social History   Social History  . Marital Status: Married    Spouse Name:  N/A  . Number of Children: 5  . Years of Education: HS   Occupational History  . Retired    Social History Main Topics  . Smoking status: Never Smoker   . Smokeless tobacco: Never Used  . Alcohol Use: No  . Drug Use: No  . Sexual Activity: Not on file   Other Topics Concern  . Not on file   Social History Narrative   Lives at home alone.   Right-handed.   Two cups caffeine daily (coffee).      Family History  Problem Relation Age of Onset  . Diabetes Brother   . Alzheimer's disease Sister   . CVA Daughter     01/21/09  . Pancreatic cancer Father 33  . Pneumonia Mother 34    cause of death  . Heart attack Neg Hx   . Stroke Daughter   . Hypertension Daughter     Danley Danker Vitals:   01/08/15 1510  BP: 119/50  Pulse: 98  Height: 5\' 4"  (1.626 m)  Weight: 159 lb 12.8 oz (72.485 kg)  SpO2: 100%    PHYSICAL EXAM: General: In wheelchair, NAD HEENT: Normal, without mass or lesion. Neck: Supple, no bruits. JVP 6-7 cm. Carotids 2+. No thyromegaly or nodule. Heart: PMI nondisplaced  tachy, no s4. +S3, no murmur. Lungs: Slightly diminished R base Abdomen: Soft, NT, ND, no HSM +BS x 4.  Extremities: No clubbing, cyanosis. Trace ankle edema.  LUE picc blood around PICC.  Neuro: Alert and oriented X 3. Moves all extremities spontaneously.  Psych: Affect pleaseant  ASSESSMENT & PLAN:  1. Chronic Systolic Heart failure - Echo 10/02/14 EF 15% NYHA IIIB. On  chronic milrinone 0.125 mcg. Continue digoxin 0.0625 mg daily . No bb. Volume status stable. Continue lasix 40 mg daily + 10 meq potassium daily .   Continue corlanor 5 mg twice a day.   2. Recent PE/ DVT bilateral - Continue Xarelto 20 mg daily  3. CKD Stage 3  4. Hx of LBBB 5. Hypertension - stable on current regimen 6. Intolerance of amiodarone due to possible acute lung toxicity 7. H/O Bacteremia- cornybacterium 2/2 cultures 11/29 . Completed antibiotic course with vancomycin. PICC replaced . Repeat blood cultures obtained 01/06/15.    I called [facility] to to assess  LUE PICC due to small amount of blood exudate noted under the dressing.   Follow up 6 weeks with Dr Haroldine Laws.    Amy Clegg NP-C   01/08/2015   Patient seen and examined with Darrick Grinder, NP. We discussed all aspects of the encounter. I agree with the assessment and plan as stated above.   She is stable after recent admit for PICC infection. PICC changed. Completed abx course. She seems improved on Corlanor. I left a VM for Dr. Caryl Comes regarding timing of CRT placement. Will continue milrinone.   Idella Lamontagne,MD 11:21 PM

## 2015-01-08 NOTE — Progress Notes (Signed)
Advanced Heart Failure Medication Review by a Pharmacist  Does the patient  feel that his/her medications are working for him/her?  yes  Has the patient been experiencing any side effects to the medications prescribed?  no  Does the patient measure his/her own blood pressure or blood glucose at home?  yes   Does the patient have any problems obtaining medications due to transportation or finances?   no  Understanding of regimen: good Understanding of indications: good Potential of compliance: excellent Patient understands to avoid NSAIDs. Patient understands to avoid decongestants.  Issues to address at subsequent visits: None   Pharmacist comments:  [name] is a pleasant 81 yo F presenting with her daughter and a current medication list. They report excellent compliance with her medications. They did ask if they should continue the probiotic she was on while she was on vancomycin. She has had loose stools chronically for months now which have not worsened. If she feels like the Florastor has been helping, she can continue to use it.  Alexandra [name]. Alexandra [name], PharmD, BCPS, CPP Clinical Pharmacist Pager: [phone] Phone: [phone] 01/08/2015 3:39 PM      Time with patient: 10 minutes Preparation and documentation time: 2 minutes Total time: 12 minutes

## 2015-01-08 NOTE — Patient Instructions (Signed)
FOLLOW UP: 4 weeks with Dr.Bensimhon  HAPPY HOLIDAYS!!!!!

## 2015-01-09 ENCOUNTER — Encounter: Payer: Self-pay | Admitting: *Deleted

## 2015-01-09 DIAGNOSIS — G609 Hereditary and idiopathic neuropathy, unspecified: Secondary | ICD-10-CM | POA: Diagnosis not present

## 2015-01-09 DIAGNOSIS — E669 Obesity, unspecified: Secondary | ICD-10-CM | POA: Diagnosis not present

## 2015-01-09 DIAGNOSIS — I5023 Acute on chronic systolic (congestive) heart failure: Secondary | ICD-10-CM | POA: Diagnosis not present

## 2015-01-09 DIAGNOSIS — N183 Chronic kidney disease, stage 3 (moderate): Secondary | ICD-10-CM | POA: Diagnosis not present

## 2015-01-09 DIAGNOSIS — Z9981 Dependence on supplemental oxygen: Secondary | ICD-10-CM | POA: Diagnosis not present

## 2015-01-09 DIAGNOSIS — Z452 Encounter for adjustment and management of vascular access device: Secondary | ICD-10-CM | POA: Diagnosis not present

## 2015-01-09 DIAGNOSIS — I129 Hypertensive chronic kidney disease with stage 1 through stage 4 chronic kidney disease, or unspecified chronic kidney disease: Secondary | ICD-10-CM | POA: Diagnosis not present

## 2015-01-09 DIAGNOSIS — Z5181 Encounter for therapeutic drug level monitoring: Secondary | ICD-10-CM | POA: Diagnosis not present

## 2015-01-09 DIAGNOSIS — Z79899 Other long term (current) drug therapy: Secondary | ICD-10-CM | POA: Diagnosis not present

## 2015-01-09 NOTE — Telephone Encounter (Signed)
I spoke with Dr. Caryl Comes yesterday evening- he received a message from Dr. Haroldine Laws that blood cultures so far are looking good.  We will go ahead and schedule the patient for 01/16/15 for her Bi-V pacer implant.   I spoke with the patient and her daughter and they are ok with her procedure on 12/29. This is scheduled at 1:30 pm with Dr. Caryl Comes. Verbal instructions for procedure given to the patient and her daughter and they verbalize understanding of these instructions.  I will call [facility] and see if they can draw a BMP/ CBC on her tomorrow when they come back to see her.    I called [facility] and gave a verbal order for a BMP/ CBC w/ diff with her next [facility] visit tomorrow.

## 2015-01-10 DIAGNOSIS — Z9981 Dependence on supplemental oxygen: Secondary | ICD-10-CM | POA: Diagnosis not present

## 2015-01-10 DIAGNOSIS — I5023 Acute on chronic systolic (congestive) heart failure: Secondary | ICD-10-CM | POA: Diagnosis not present

## 2015-01-10 DIAGNOSIS — Z452 Encounter for adjustment and management of vascular access device: Secondary | ICD-10-CM | POA: Diagnosis not present

## 2015-01-10 DIAGNOSIS — N183 Chronic kidney disease, stage 3 (moderate): Secondary | ICD-10-CM | POA: Diagnosis not present

## 2015-01-10 DIAGNOSIS — G609 Hereditary and idiopathic neuropathy, unspecified: Secondary | ICD-10-CM | POA: Diagnosis not present

## 2015-01-10 DIAGNOSIS — E669 Obesity, unspecified: Secondary | ICD-10-CM | POA: Diagnosis not present

## 2015-01-10 DIAGNOSIS — Z79899 Other long term (current) drug therapy: Secondary | ICD-10-CM | POA: Diagnosis not present

## 2015-01-10 DIAGNOSIS — I129 Hypertensive chronic kidney disease with stage 1 through stage 4 chronic kidney disease, or unspecified chronic kidney disease: Secondary | ICD-10-CM | POA: Diagnosis not present

## 2015-01-10 DIAGNOSIS — Z5181 Encounter for therapeutic drug level monitoring: Secondary | ICD-10-CM | POA: Diagnosis not present

## 2015-01-11 DIAGNOSIS — N183 Chronic kidney disease, stage 3 (moderate): Secondary | ICD-10-CM | POA: Diagnosis not present

## 2015-01-11 DIAGNOSIS — E669 Obesity, unspecified: Secondary | ICD-10-CM | POA: Diagnosis not present

## 2015-01-11 DIAGNOSIS — G609 Hereditary and idiopathic neuropathy, unspecified: Secondary | ICD-10-CM | POA: Diagnosis not present

## 2015-01-11 DIAGNOSIS — Z9981 Dependence on supplemental oxygen: Secondary | ICD-10-CM | POA: Diagnosis not present

## 2015-01-11 DIAGNOSIS — Z5181 Encounter for therapeutic drug level monitoring: Secondary | ICD-10-CM | POA: Diagnosis not present

## 2015-01-11 DIAGNOSIS — Z79899 Other long term (current) drug therapy: Secondary | ICD-10-CM | POA: Diagnosis not present

## 2015-01-11 DIAGNOSIS — I5023 Acute on chronic systolic (congestive) heart failure: Secondary | ICD-10-CM | POA: Diagnosis not present

## 2015-01-11 DIAGNOSIS — Z452 Encounter for adjustment and management of vascular access device: Secondary | ICD-10-CM | POA: Diagnosis not present

## 2015-01-11 DIAGNOSIS — I129 Hypertensive chronic kidney disease with stage 1 through stage 4 chronic kidney disease, or unspecified chronic kidney disease: Secondary | ICD-10-CM | POA: Diagnosis not present

## 2015-01-13 DIAGNOSIS — Z5181 Encounter for therapeutic drug level monitoring: Secondary | ICD-10-CM | POA: Diagnosis not present

## 2015-01-13 DIAGNOSIS — I129 Hypertensive chronic kidney disease with stage 1 through stage 4 chronic kidney disease, or unspecified chronic kidney disease: Secondary | ICD-10-CM | POA: Diagnosis not present

## 2015-01-13 DIAGNOSIS — Z9981 Dependence on supplemental oxygen: Secondary | ICD-10-CM | POA: Diagnosis not present

## 2015-01-13 DIAGNOSIS — G609 Hereditary and idiopathic neuropathy, unspecified: Secondary | ICD-10-CM | POA: Diagnosis not present

## 2015-01-13 DIAGNOSIS — Z452 Encounter for adjustment and management of vascular access device: Secondary | ICD-10-CM | POA: Diagnosis not present

## 2015-01-13 DIAGNOSIS — E669 Obesity, unspecified: Secondary | ICD-10-CM | POA: Diagnosis not present

## 2015-01-13 DIAGNOSIS — N183 Chronic kidney disease, stage 3 (moderate): Secondary | ICD-10-CM | POA: Diagnosis not present

## 2015-01-13 DIAGNOSIS — Z79899 Other long term (current) drug therapy: Secondary | ICD-10-CM | POA: Diagnosis not present

## 2015-01-13 DIAGNOSIS — I5023 Acute on chronic systolic (congestive) heart failure: Secondary | ICD-10-CM | POA: Diagnosis not present

## 2015-01-14 ENCOUNTER — Encounter (HOSPITAL_COMMUNITY): Payer: Commercial Managed Care - HMO | Admitting: Internal Medicine

## 2015-01-14 DIAGNOSIS — E669 Obesity, unspecified: Secondary | ICD-10-CM | POA: Diagnosis not present

## 2015-01-14 DIAGNOSIS — G609 Hereditary and idiopathic neuropathy, unspecified: Secondary | ICD-10-CM | POA: Diagnosis not present

## 2015-01-14 DIAGNOSIS — Z9981 Dependence on supplemental oxygen: Secondary | ICD-10-CM | POA: Diagnosis not present

## 2015-01-14 DIAGNOSIS — I2699 Other pulmonary embolism without acute cor pulmonale: Secondary | ICD-10-CM | POA: Diagnosis not present

## 2015-01-14 DIAGNOSIS — Z5181 Encounter for therapeutic drug level monitoring: Secondary | ICD-10-CM | POA: Diagnosis not present

## 2015-01-14 DIAGNOSIS — I129 Hypertensive chronic kidney disease with stage 1 through stage 4 chronic kidney disease, or unspecified chronic kidney disease: Secondary | ICD-10-CM | POA: Diagnosis not present

## 2015-01-14 DIAGNOSIS — Z79899 Other long term (current) drug therapy: Secondary | ICD-10-CM | POA: Diagnosis not present

## 2015-01-14 DIAGNOSIS — Z452 Encounter for adjustment and management of vascular access device: Secondary | ICD-10-CM | POA: Diagnosis not present

## 2015-01-14 DIAGNOSIS — J189 Pneumonia, unspecified organism: Secondary | ICD-10-CM | POA: Diagnosis not present

## 2015-01-14 DIAGNOSIS — I5043 Acute on chronic combined systolic (congestive) and diastolic (congestive) heart failure: Secondary | ICD-10-CM | POA: Diagnosis not present

## 2015-01-14 DIAGNOSIS — I5023 Acute on chronic systolic (congestive) heart failure: Secondary | ICD-10-CM | POA: Diagnosis not present

## 2015-01-14 DIAGNOSIS — N183 Chronic kidney disease, stage 3 (moderate): Secondary | ICD-10-CM | POA: Diagnosis not present

## 2015-01-14 DIAGNOSIS — I429 Cardiomyopathy, unspecified: Secondary | ICD-10-CM | POA: Diagnosis not present

## 2015-01-15 ENCOUNTER — Encounter: Payer: Self-pay | Admitting: Internal Medicine

## 2015-01-15 DIAGNOSIS — G609 Hereditary and idiopathic neuropathy, unspecified: Secondary | ICD-10-CM | POA: Diagnosis not present

## 2015-01-15 DIAGNOSIS — E669 Obesity, unspecified: Secondary | ICD-10-CM | POA: Diagnosis not present

## 2015-01-15 DIAGNOSIS — Z9981 Dependence on supplemental oxygen: Secondary | ICD-10-CM | POA: Diagnosis not present

## 2015-01-15 DIAGNOSIS — Z5181 Encounter for therapeutic drug level monitoring: Secondary | ICD-10-CM | POA: Diagnosis not present

## 2015-01-15 DIAGNOSIS — Z452 Encounter for adjustment and management of vascular access device: Secondary | ICD-10-CM | POA: Diagnosis not present

## 2015-01-15 DIAGNOSIS — N183 Chronic kidney disease, stage 3 (moderate): Secondary | ICD-10-CM | POA: Diagnosis not present

## 2015-01-15 DIAGNOSIS — E43 Unspecified severe protein-calorie malnutrition: Secondary | ICD-10-CM

## 2015-01-15 DIAGNOSIS — I129 Hypertensive chronic kidney disease with stage 1 through stage 4 chronic kidney disease, or unspecified chronic kidney disease: Secondary | ICD-10-CM | POA: Diagnosis not present

## 2015-01-15 DIAGNOSIS — I5023 Acute on chronic systolic (congestive) heart failure: Secondary | ICD-10-CM | POA: Diagnosis not present

## 2015-01-15 DIAGNOSIS — Z79899 Other long term (current) drug therapy: Secondary | ICD-10-CM | POA: Diagnosis not present

## 2015-01-16 ENCOUNTER — Encounter: Payer: Self-pay | Admitting: Internal Medicine

## 2015-01-16 ENCOUNTER — Encounter (HOSPITAL_COMMUNITY)
Admission: RE | Disposition: A | Discharge status: Home / Self Care | Payer: Commercial Managed Care - HMO | Source: Ambulatory Visit | Attending: Internal Medicine

## 2015-01-16 ENCOUNTER — Ambulatory Visit (HOSPITAL_COMMUNITY)
Admission: RE | Admit: 2015-01-16 | Discharge: 2015-01-17 | Disposition: A | Discharge status: Home / Self Care | Payer: Commercial Managed Care - HMO | Source: Ambulatory Visit | Attending: Internal Medicine | Admitting: Internal Medicine

## 2015-01-16 DIAGNOSIS — I5022 Chronic systolic (congestive) heart failure: Secondary | ICD-10-CM | POA: Diagnosis present

## 2015-01-16 DIAGNOSIS — Z86718 Personal history of other venous thrombosis and embolism: Secondary | ICD-10-CM | POA: Diagnosis not present

## 2015-01-16 DIAGNOSIS — J45909 Unspecified asthma, uncomplicated: Secondary | ICD-10-CM | POA: Insufficient documentation

## 2015-01-16 DIAGNOSIS — Z86711 Personal history of pulmonary embolism: Secondary | ICD-10-CM | POA: Insufficient documentation

## 2015-01-16 DIAGNOSIS — E7800 Pure hypercholesterolemia, unspecified: Secondary | ICD-10-CM | POA: Insufficient documentation

## 2015-01-16 DIAGNOSIS — F4321 Adjustment disorder with depressed mood: Secondary | ICD-10-CM | POA: Insufficient documentation

## 2015-01-16 DIAGNOSIS — F419 Anxiety disorder, unspecified: Secondary | ICD-10-CM | POA: Insufficient documentation

## 2015-01-16 DIAGNOSIS — Z79899 Other long term (current) drug therapy: Secondary | ICD-10-CM | POA: Diagnosis not present

## 2015-01-16 DIAGNOSIS — I447 Left bundle-branch block, unspecified: Secondary | ICD-10-CM | POA: Insufficient documentation

## 2015-01-16 DIAGNOSIS — G609 Hereditary and idiopathic neuropathy, unspecified: Secondary | ICD-10-CM | POA: Insufficient documentation

## 2015-01-16 DIAGNOSIS — Z6826 Body mass index (BMI) 26.0-26.9, adult: Secondary | ICD-10-CM | POA: Insufficient documentation

## 2015-01-16 DIAGNOSIS — N183 Chronic kidney disease, stage 3 (moderate): Secondary | ICD-10-CM | POA: Insufficient documentation

## 2015-01-16 DIAGNOSIS — I428 Other cardiomyopathies: Secondary | ICD-10-CM | POA: Diagnosis not present

## 2015-01-16 DIAGNOSIS — Z853 Personal history of malignant neoplasm of breast: Secondary | ICD-10-CM | POA: Diagnosis not present

## 2015-01-16 DIAGNOSIS — E039 Hypothyroidism, unspecified: Secondary | ICD-10-CM | POA: Insufficient documentation

## 2015-01-16 DIAGNOSIS — I130 Hypertensive heart and chronic kidney disease with heart failure and stage 1 through stage 4 chronic kidney disease, or unspecified chronic kidney disease: Secondary | ICD-10-CM | POA: Insufficient documentation

## 2015-01-16 DIAGNOSIS — Z950 Presence of cardiac pacemaker: Secondary | ICD-10-CM

## 2015-01-16 DIAGNOSIS — D638 Anemia in other chronic diseases classified elsewhere: Secondary | ICD-10-CM | POA: Insufficient documentation

## 2015-01-16 DIAGNOSIS — I509 Heart failure, unspecified: Secondary | ICD-10-CM | POA: Diagnosis present

## 2015-01-16 DIAGNOSIS — E669 Obesity, unspecified: Secondary | ICD-10-CM | POA: Diagnosis not present

## 2015-01-16 DIAGNOSIS — Z959 Presence of cardiac and vascular implant and graft, unspecified: Secondary | ICD-10-CM

## 2015-01-16 DIAGNOSIS — Z7901 Long term (current) use of anticoagulants: Secondary | ICD-10-CM | POA: Diagnosis not present

## 2015-01-16 HISTORY — PX: EP IMPLANTABLE DEVICE: SHX172B

## 2015-01-16 HISTORY — DX: Presence of cardiac pacemaker: Z95.0

## 2015-01-16 LAB — SURGICAL PCR SCREEN
MRSA, PCR: POSITIVE — AB
Staphylococcus aureus: POSITIVE — AB

## 2015-01-16 SURGERY — BIV PACEMAKER INSERTION CRT-P

## 2015-01-16 MED ORDER — CEFAZOLIN SODIUM-DEXTROSE 2-3 GM-% IV SOLR
INTRAVENOUS | Status: AC
  Filled 2015-01-16: qty 50

## 2015-01-16 MED ORDER — SODIUM CHLORIDE 0.9 % IJ SOLN
10.0000 mL | Freq: Two times a day (BID) | INTRAMUSCULAR | Status: DC
Start: 2015-01-16 — End: 2015-01-17

## 2015-01-16 MED ORDER — FUROSEMIDE 40 MG PO TABS
40.0000 mg | ORAL_TABLET | Freq: Every day | ORAL | Status: DC
  Administered 2015-01-16 – 2015-01-17 (×2): 40 mg via ORAL
  Filled 2015-01-16 (×2): qty 1

## 2015-01-16 MED ORDER — DIPHENHYDRAMINE HCL 50 MG/ML IJ SOLN
INTRAMUSCULAR | Status: DC | PRN
  Administered 2015-01-16: 50 mg via INTRAVENOUS

## 2015-01-16 MED ORDER — CEFAZOLIN SODIUM-DEXTROSE 2-3 GM-% IV SOLR
2.0000 g | INTRAVENOUS | Status: AC
  Administered 2015-01-16: 2 g via INTRAVENOUS

## 2015-01-16 MED ORDER — SODIUM CHLORIDE 0.9 % IR SOLN
Status: AC
  Filled 2015-01-16: qty 2

## 2015-01-16 MED ORDER — LIDOCAINE HCL (PF) 1 % IJ SOLN
INTRAMUSCULAR | Status: AC
  Filled 2015-01-16: qty 60

## 2015-01-16 MED ORDER — YOU HAVE A PACEMAKER BOOK
Freq: Once | Status: AC
  Administered 2015-01-16: 22:00:00 1
  Filled 2015-01-16: qty 1

## 2015-01-16 MED ORDER — LIDOCAINE HCL (PF) 1 % IJ SOLN
INTRAMUSCULAR | Status: DC | PRN
  Administered 2015-01-16: 31 mL via INTRADERMAL

## 2015-01-16 MED ORDER — MUPIROCIN 2 % EX OINT
TOPICAL_OINTMENT | CUTANEOUS | Status: AC
  Administered 2015-01-16: 13:00:00
  Filled 2015-01-16: qty 22

## 2015-01-16 MED ORDER — IVABRADINE HCL 5 MG PO TABS
5.0000 mg | ORAL_TABLET | Freq: Two times a day (BID) | ORAL | Status: DC
  Administered 2015-01-16 – 2015-01-17 (×2): 5 mg via ORAL
  Filled 2015-01-16 (×4): qty 1

## 2015-01-16 MED ORDER — MILRINONE LACTATE 1 MG/ML IV SOLN: 0.1250 ug/kg/min | INTRAVENOUS | Status: DC

## 2015-01-16 MED ORDER — MILRINONE IN DEXTROSE 20 MG/100ML IV SOLN
0.1250 ug/kg/min | INTRAVENOUS | Status: DC
  Filled 2015-01-16: qty 100

## 2015-01-16 MED ORDER — FAMOTIDINE IN NACL 20-0.9 MG/50ML-% IV SOLN
INTRAVENOUS | Status: DC | PRN
  Administered 2015-01-16: 20 mg via INTRAVENOUS

## 2015-01-16 MED ORDER — VITAMIN D 1000 UNITS PO TABS: 1000.0000 [IU] | ORAL_TABLET | Freq: Every day | ORAL | Status: DC

## 2015-01-16 MED ORDER — DIPHENHYDRAMINE HCL 50 MG/ML IJ SOLN
INTRAMUSCULAR | Status: AC
  Filled 2015-01-16: qty 1

## 2015-01-16 MED ORDER — ALPRAZOLAM 0.25 MG PO TABS: 0.2500 mg | ORAL_TABLET | Freq: Three times a day (TID) | ORAL | Status: DC | PRN

## 2015-01-16 MED ORDER — SODIUM CHLORIDE 0.9 % IJ SOLN
10.0000 mL | INTRAMUSCULAR | Status: DC | PRN
  Administered 2015-01-17: 04:00:00 20 mL
  Filled 2015-01-16: qty 40

## 2015-01-16 MED ORDER — MILRINONE IN DEXTROSE 20 MG/100ML IV SOLN: 0.1250 ug/kg/min | INTRAVENOUS | Status: DC

## 2015-01-16 MED ORDER — POTASSIUM CHLORIDE ER 10 MEQ PO TBCR
10.0000 meq | EXTENDED_RELEASE_TABLET | Freq: Every day | ORAL | Status: DC
  Administered 2015-01-17: 10:00:00 10 meq via ORAL
  Filled 2015-01-16 (×2): qty 1

## 2015-01-16 MED ORDER — SODIUM CHLORIDE 0.9 % IR SOLN
80.0000 mg | Status: AC
  Administered 2015-01-16: 80 mg

## 2015-01-16 MED ORDER — SPIRONOLACTONE 25 MG PO TABS
25.0000 mg | ORAL_TABLET | Freq: Every day | ORAL | Status: DC
  Administered 2015-01-17: 25 mg via ORAL
  Filled 2015-01-16: qty 1

## 2015-01-16 MED ORDER — SODIUM CHLORIDE 0.9 % IV SOLN: INTRAVENOUS | Status: AC

## 2015-01-16 MED ORDER — MIDAZOLAM HCL 5 MG/5ML IJ SOLN
INTRAMUSCULAR | Status: DC | PRN
  Administered 2015-01-16 (×3): 1 mg via INTRAVENOUS

## 2015-01-16 MED ORDER — DIGOXIN 0.0625 MG HALF TABLET
0.0625 mg | ORAL_TABLET | Freq: Every day | ORAL | Status: DC
  Administered 2015-01-17: 10:00:00 0.0625 mg via ORAL
  Filled 2015-01-16: qty 1

## 2015-01-16 MED ORDER — CEFAZOLIN SODIUM 1-5 GM-% IV SOLN
1.0000 g | Freq: Four times a day (QID) | INTRAVENOUS | Status: AC
  Administered 2015-01-16 – 2015-01-17 (×3): 1 g via INTRAVENOUS
  Filled 2015-01-16 (×3): qty 50

## 2015-01-16 MED ORDER — SODIUM CHLORIDE 0.9 % IV SOLN
INTRAVENOUS | Status: DC | PRN
  Administered 2015-01-16: 50 mL/h via INTRAVENOUS

## 2015-01-16 MED ORDER — HEPARIN (PORCINE) IN NACL 2-0.9 UNIT/ML-% IJ SOLN
INTRAMUSCULAR | Status: AC
  Filled 2015-01-16: qty 1000

## 2015-01-16 MED ORDER — ACETAMINOPHEN 500 MG PO TABS
500.0000 mg | ORAL_TABLET | Freq: Four times a day (QID) | ORAL | Status: DC | PRN
  Administered 2015-01-16 – 2015-01-17 (×2): 500 mg via ORAL
  Filled 2015-01-16 (×2): qty 1

## 2015-01-16 MED ORDER — BOOST PO LIQD
237.0000 mL | Freq: Every day | ORAL | Status: DC
  Filled 2015-01-16 (×2): qty 237

## 2015-01-16 MED ORDER — FENTANYL CITRATE (PF) 100 MCG/2ML IJ SOLN
INTRAMUSCULAR | Status: DC | PRN
  Administered 2015-01-16: 25 ug via INTRAVENOUS

## 2015-01-16 MED ORDER — NITROGLYCERIN 0.4 MG SL SUBL: 0.4000 mg | SUBLINGUAL_TABLET | SUBLINGUAL | Status: DC | PRN

## 2015-01-16 MED ORDER — METHYLPREDNISOLONE SODIUM SUCC 125 MG IJ SOLR
INTRAMUSCULAR | Status: DC | PRN
  Administered 2015-01-16: 125 mg via INTRAVENOUS

## 2015-01-16 MED ORDER — CHLORHEXIDINE GLUCONATE 4 % EX LIQD: 60.0000 mL | Freq: Once | CUTANEOUS | Status: DC

## 2015-01-16 MED ORDER — B COMPLEX-C PO TABS
1.0000 | ORAL_TABLET | Freq: Every day | ORAL | Status: DC
  Filled 2015-01-16: qty 1

## 2015-01-16 MED ORDER — CITALOPRAM HYDROBROMIDE 20 MG PO TABS
20.0000 mg | ORAL_TABLET | Freq: Every day | ORAL | Status: DC
  Administered 2015-01-17: 20 mg via ORAL
  Filled 2015-01-16: qty 1

## 2015-01-16 MED ORDER — MIDAZOLAM HCL 5 MG/5ML IJ SOLN
INTRAMUSCULAR | Status: AC
  Filled 2015-01-16: qty 5

## 2015-01-16 MED ORDER — ADULT MULTIVITAMIN W/MINERALS CH: 1.0000 | ORAL_TABLET | Freq: Every day | ORAL | Status: DC

## 2015-01-16 MED ORDER — SODIUM CHLORIDE 0.9 % IV SOLN
INTRAVENOUS | Status: DC
  Administered 2015-01-16: 13:00:00 via INTRAVENOUS

## 2015-01-16 MED ORDER — FENTANYL CITRATE (PF) 100 MCG/2ML IJ SOLN
INTRAMUSCULAR | Status: AC
  Filled 2015-01-16: qty 2

## 2015-01-16 MED ORDER — PANTOPRAZOLE SODIUM 40 MG PO TBEC
40.0000 mg | DELAYED_RELEASE_TABLET | Freq: Every day | ORAL | Status: DC
  Administered 2015-01-17: 10:00:00 40 mg via ORAL
  Filled 2015-01-16: qty 1

## 2015-01-16 MED ORDER — LEVOTHYROXINE SODIUM 50 MCG PO TABS
25.0000 ug | ORAL_TABLET | Freq: Every day | ORAL | Status: DC
  Administered 2015-01-17: 06:00:00 25 ug via ORAL
  Filled 2015-01-16: qty 1

## 2015-01-16 MED ORDER — HEPARIN (PORCINE) IN NACL 2-0.9 UNIT/ML-% IJ SOLN
INTRAMUSCULAR | Status: DC | PRN
  Administered 2015-01-16: 15:00:00

## 2015-01-16 MED ORDER — RIVAROXABAN 20 MG PO TABS
20.0000 mg | ORAL_TABLET | Freq: Every day | ORAL | Status: DC
  Administered 2015-01-17: 10:00:00 20 mg via ORAL
  Filled 2015-01-16: qty 1

## 2015-01-16 MED ORDER — MUPIROCIN 2 % EX OINT: 1.0000 "application " | TOPICAL_OINTMENT | Freq: Once | CUTANEOUS | Status: DC

## 2015-01-16 MED ORDER — ALBUTEROL SULFATE (2.5 MG/3ML) 0.083% IN NEBU: 2.5000 mg | INHALATION_SOLUTION | Freq: Four times a day (QID) | RESPIRATORY_TRACT | Status: DC | PRN

## 2015-01-16 MED ORDER — TRAMADOL HCL 50 MG PO TABS: 50.0000 mg | ORAL_TABLET | Freq: Four times a day (QID) | ORAL | Status: DC | PRN

## 2015-01-16 MED ORDER — CALCIUM CARBONATE ANTACID 500 MG PO CHEW: 2.0000 | CHEWABLE_TABLET | Freq: Every day | ORAL | Status: DC | PRN

## 2015-01-16 MED ORDER — ONDANSETRON HCL 4 MG/2ML IJ SOLN: 4.0000 mg | Freq: Four times a day (QID) | INTRAMUSCULAR | Status: DC | PRN

## 2015-01-16 SURGICAL SUPPLY — 18 items
ALLURE CRT PM3262 (Pacemaker) ×2 IMPLANT
CABLE SURGICAL S-101-97-12 (CABLE) ×1 IMPLANT
CATH CPS DIRECT 135 DS2C020 (CATHETERS) ×1 IMPLANT
HEMOSTAT SURGICEL 2X4 FIBR (HEMOSTASIS) ×1 IMPLANT
KIT ESSENTIALS PG (KITS) ×1 IMPLANT
LEAD QUARTET 1456Q-86 (Lead) IMPLANT
LEAD TENDRIL SDX 1688TC-52CM (Lead) ×1 IMPLANT
LEAD TENDRIL SDX 1688TC-58CM (Lead) ×1 IMPLANT
PACEMAKER ALLURE CRT (Pacemaker) IMPLANT
PAD DEFIB LIFELINK (PAD) ×1 IMPLANT
QUARTET 1456Q-86 (Lead) ×2 IMPLANT
SHEATH CLASSIC 7F (SHEATH) ×2 IMPLANT
SHEATH CLASSIC 9.5F (SHEATH) ×1 IMPLANT
SHIELD RADPAD SCOOP 12X17 (MISCELLANEOUS) ×1 IMPLANT
SLITTER UNIVERSAL DS2A003 (MISCELLANEOUS) ×1 IMPLANT
TRAY PACEMAKER INSERTION (PACKS) ×1 IMPLANT
WIRE ACUITY WHISPER EDS 4648 (WIRE) ×1 IMPLANT
WIRE HI TORQ VERSACORE-J 145CM (WIRE) ×1 IMPLANT

## 2015-01-16 NOTE — Progress Notes (Signed)
Will resume Rivaroxaban in am at 20 mg   Not sure decision re duration  Aware of hx of DVT

## 2015-01-16 NOTE — Progress Notes (Signed)
    Paged by Lynelle Smoke, RN. Per Dr. Caryl Comes the patient is to continue her home Milrinone dose with the pump she has from home. Per pharmacy she needed an order from the APP/MD in order to continue this. I have put in a nursing care order to keep her on her home milrinone.    Angelena Form PA-C  [facility]

## 2015-01-16 NOTE — H&P (Signed)
Patient Care Team: Lona Kettle, MD as PCP - General (Family Medicine) Tobi Bastos, RN as [place], NP as [facility] (Gerontology)   HPI  Alexandra [name] is a 81 y.o. female here for CRT-P   She has a history of nonischemic cardiomyopathy and left bundle branch block. It was initially thought to be related to T[place] when she presented 7/16 EF at that time was 20%. She was admitted 9/16 with low output and was treated with milrinone down titration of which was required because of tachycardia. 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 Milrinone has been continued at very low doses. She remains functionally very impaired able to walk less than 50 feet.    Also on  Low dose ivabradine  She has intercurrently had PICC line assoc bacteremia and underwent removal  ABX and surveillance cultures one week after ABx are neg at 5 days After discussion with ID ok to proceed  Back on IV milrinone with PICC on L  Sob stable able to lie flat  No edema  Records and Results Reviewed out pr records  Past Medical History  Diagnosis Date  . Arthritis   . Headache(784.0)   . Anxiety   . Depression   . Asthma   . CAP (community acquired pneumonia) 12/19/2012  . Intermittent LBBB (left bundle branch block) 12/21/2012  . Obesity   . Peripheral neuropathy ([place])   . Sacroiliitis, not elsewhere classified ([place])   . Osteopenia   . Hypercholesteremia   . Neuropathy ([place])   . Trigger finger     [name]  . Chest pain     NM stress test 05/2011 Normal  . DOE (dyspnea on exertion)     No SOB at rest  . Fatigue     excertional  . breast ca dx'd 2000    left  . Essential (primary) hypertension 04/02/2014  . Adult hypothyroidism 04/02/2014  . Hereditary and idiopathic peripheral neuropathy 07/02/2014  . Leg  weakness   . CKD (chronic kidney disease) stage 3, GFR 30-59 ml/min 08/13/2014  . Anemia of chronic disease 08/13/2014  . CHF (congestive heart failure) ([place]) 08/19/2014  . Situational depression     Past Surgical History  Procedure Laterality Date  . Leg surgery    . Breast lumpectomy      breast cancer  . Lumbar laminectomy    . Achilles tendon surgery    . Tonsillectomy    . Vesicovaginal fistula closure w/ tah      DC hysterectomy  . Polypectomy    . Cholecystectomy N/A 08/13/2014    Procedure: LAPAROSCOPIC CHOLECYSTECTOMY;  Surgeon: Stark Klein, MD;  Location: [facility]S;  Service: General;  Laterality: N/A;    Current Facility-Administered Medications  Medication Dose Route Frequency Provider Last Rate Last Dose  . 0.9 %  sodium chloride infusion   Intravenous Continuous Amber Sena Slate, NP      . ceFAZolin (ANCEF) IVPB 2 g/50 mL premix  2 g Intravenous On Call Amber Sena Slate, NP      . chlorhexidine (HIBICLENS) 4 % liquid 4 application  60 mL Topical Once Amber Sena Slate, NP      . gentamicin (GARAMYCIN) 80 mg in sodium chloride irrigation 0.9 % 500 mL irrigation  80 mg Irrigation On Call Patsey Berthold, NP      .  mupirocin ointment (BACTROBAN) 2 %             Allergies  Allergen Reactions  . Amiodarone Shortness Of Breath and Nausea And Vomiting  . Cymbalta [Duloxetine Hcl] Other (See Comments)    Depression  . Lyrica [Pregabalin] Other (See Comments)    Blurry vision     Medication List    ASK your doctor about these medications        acetaminophen 500 MG tablet  Commonly known as:  TYLENOL  Take 500 mg by mouth every 6 (six) hours as needed for mild pain or headache.     albuterol 108 (90 Base) MCG/ACT inhaler  Commonly known as:  PROVENTIL HFA;VENTOLIN HFA  Inhale 2 puffs into the lungs every 6 (six) hours as needed for wheezing or shortness of breath.     ALPRAZolam 0.25 MG tablet  Commonly known as:  XANAX  Take 0.25 mg by mouth 3 (three) times daily as  needed for anxiety.     b complex vitamins tablet  Take 1 tablet by mouth daily with lunch.     calcium carbonate 500 MG chewable tablet  Commonly known as:  TUMS - dosed in mg elemental calcium  Chew 2 tablets by mouth daily as needed for indigestion or heartburn.     CALTRATE 600+D PO  Take 1 tablet by mouth daily with lunch.     cholecalciferol 1000 units tablet  Commonly known as:  VITAMIN D  Take 1,000 Units by mouth daily with lunch.     citalopram 20 MG tablet  Commonly known as:  CELEXA  Take 1 tablet (20 mg total) by mouth daily.     Digoxin 62.5 MCG Tabs  Take 0.0625 mg by mouth daily.     FIBERCON PO  Take 2.5-5 mLs by mouth daily as needed (constipation). Mix in 8 oz liquid and drink     furosemide 40 MG tablet  Commonly known as:  LASIX  Take 1 tablet (40 mg total) by mouth daily.     ivabradine 5 MG Tabs tablet  Commonly known as:  CORLANOR  Take 1 tablet (5 mg total) by mouth 2 (two) times daily with a meal.     lactose free nutrition Liqd  Take 237 mLs by mouth daily with breakfast.     levothyroxine 25 MCG tablet  Commonly known as:  SYNTHROID, LEVOTHROID  Take 25 mcg by mouth daily before breakfast. for thyroid     milrinone 20 MG/100ML Soln infusion  Commonly known as:  PRIMACOR  Inject 9.65 mcg/min into the vein continuous.     multivitamin with minerals Tabs tablet  Take 1 tablet by mouth daily with lunch.     nitroGLYCERIN 0.4 MG SL tablet  Commonly known as:  NITROSTAT  Place 1 tablet (0.4 mg total) under the tongue every 5 (five) minutes as needed for chest pain.     OXYGEN  Inhale 2 L into the lungs daily as needed (shortness of breath).     pantoprazole 40 MG tablet  Commonly known as:  PROTONIX  Take 40 mg by mouth daily.     potassium chloride 10 MEQ tablet  Commonly known as:  K-DUR  Take 1 tablet (10 mEq total) by mouth daily.     sodium chloride 0.9 % SOLN with milrinone 1 MG/ML SOLN 200 mcg/mL  Inject 0.125 mcg/kg/min  into the vein continuous.     spironolactone 25 MG tablet  Commonly known as:  ALDACTONE  Take 1 tablet (25 mg total) by mouth daily.     SYSTANE OP  Place 1 drop into both eyes 2 (two) times daily.     traMADol 50 MG tablet  Commonly known as:  ULTRAM  Take 1 tablet (50 mg total) by mouth every 6 (six) hours as needed for moderate pain.     XARELTO 20 MG Tabs tablet  Generic drug:  rivaroxaban  TAKE 1 TABLET BY MOUTH EVERY DAY          Review of Systems negative except from HPI and PMH  Physical Exam BP 120/60 mmHg  Pulse 91  Temp(Src) 98.1 F (36.7 C) (Oral)  Resp 18  Ht 5' 4.5" (1.638 m)  Wt 157 lb (71.215 kg)  BMI 26.54 kg/m2  SpO2 98% Well developed and well nourished in no acute distress HENT normal E scleral and icterus clear Neck Supple JVP8; carotids brisk and full Clear to ausculation  Regular rate and rhythm, no murmurs gallops or rub Soft with active bowel sounds No clubbing cyanosis Trace Edema Alert and oriented, grossly normal motor and sensory function Skin Warm and Dry  ECG 12/21  Sr 90 19/15/44 LBBB  Assessment and  Plan  LBBB NICM Ambualtorty class IV   Currently stable on milrinone   Have reviewed the potential benefits and risks of Ipcaer including but not limited to death, perforation of heart or lung, lead dislodgement, infection,  device malfunction and inability to place LV lead.  The patient and family express understanding  and are willing to proceed.

## 2015-01-16 NOTE — Op Note (Signed)
Alexandra [name], HERMRECK NO.:  [id number]  MEDICAL RECORD NO.:  [id number]  LOCATION:  [id number]                        FACILITY:  [facility]  PHYSICIAN:  Deboraha Sprang, MD, FACCDATE OF BIRTH:  May 22, 1927  DATE OF PROCEDURE: DATE OF DISCHARGE:                              OPERATIVE REPORT   PREOPERATIVE DIAGNOSES:  Ambulatory class 4 congestive heart failure with nonischemic cardiomyopathy, milrinone dependence, left bundle- branch block.  POSTOPERATIVE DIAGNOSES:  Ambulatory class 4 congestive heart failure with nonischemic cardiomyopathy, milrinone dependence, left bundle- branch block.  PROCEDURE:  Dual-chamber pacemaker implantation with left ventricular lead placement.  DESCRIPTION OF PROCEDURE:  Following obtained informed consent, the patient was brought to electrophysiology laboratory and placed on the fluoroscopic table in supine position.  After routine prep and drape of the left upper chest, lidocaine was infiltrated in prepectoral subclavicular region.  Incision was made and carried down to layer of the prepectoral fascia with electrocautery and sharp dissection.  A pocket was formed similarly.  Hemostasis was obtained.  Thereafter attention was turned to gain access to the extrathoracic left subclavian vein which was accomplished without difficulty without the aspiration or puncture of the artery.  Three separate venipunctures were accomplished.  Guidewires were placed and retained sequentially a 7- [place], 9-French, and 7-French sheath were placed which were passed.  A St. Jude 1680 mL 58 cm active fixation ventricular lead, serial [id number].  A St. Jude 135CS cannulation sheath and a St. Jude 1688TC 52 cm active fixation atrial lead serial [id number].  Under fluoroscopic guidance, the RV lead was manipulated to the apex where bipolar R-wave was 17.1 with a pace impedance of 860 to threshold 1.4 V at 0.4 milliseconds.  Current threshold 1.4 mA.   There is no diaphragmatic pacing at 10 V.  The current of injury was moderate.  This lead was secured to the prepectoral fascia.  We then turned our attention to gain access to the coronary sinus which turned out to be done with very little difficulty.  I turned out the patient also had a DYE ALLERGY which was on the notes prior to the case. She had received steroids, Benadryl, and Pepcid in anticipation using contrast, but we were able to obtain sub selection of a high lateral branch with our introduction Wooley wire.  We then placed the Whisper wire in this vein and then passed a St. Jude 1456Q serial [id number] lead to its junction between the mid and distal 3rd.  Unfortunately, pacing thresholds were not great in any of these though, we were able to get some threshold at about 3 V at 0.5 milliseconds.  We elected to leave the lead there with the anticipation that post implant, we would find optimal pacing thresholds and all of the locations were relatively reasonable.  We did note that more proximally, the 2 LV number was higher.  The 9-French sheath was removed at this time and the atrial lead was placed in the right atrial appendage where bipolar P-wave was 3.5 with a pace impedance of 552 with threshold 0.9 V at 0.4 milliseconds.  Current threshold was 1.5 mA.  There is no diaphragmatic pacing at 10 V.  The current of  injury was brisk.  This lead was secured to the prepectoral fascia.  We then slid off the CS delivery sheath, secured the CS lead.  The pocket was copiously irrigated with antibiotic containing saline solution.  The leads were attached to a McDuffie pacemaker, serial [id number].  Biventricular pacing was noted with significant narrowing of the QRS.  The leads and pulse generator were placed in the pocket.  Surgicel was placed also.  The device was secured to the prepectoral fascia.  The wound was then closed in 2 layers in normal fashion.  The wound  was washed, dried, and a benzoin Steri-Strip dressing was applied.  Needle counts, sponge counts, and instrument counts were correct at the end of procedure according to the staff.  The patient tolerated the procedure without apparent complication. Estimated blood loss was minimal.     Deboraha Sprang, MD, [facility]     SCK/MEDQ  D:  01/16/2015  T:  01/16/2015  Job:  [id number]

## 2015-01-16 NOTE — Progress Notes (Addendum)
Alexandra Form, PA contacted to discuss continuing home milrinone drip and pump. Pt has been receiving IV milrinone since September and has had it continued during pacer placement today. Pt should discharge home tomorrow, and will continue her milrinone. Daughter states her mother may get to come off the milrinone in a few weeks since pacer was placed.  Pt dose reading on pump at 0.70ml/hr.  Pt and daughter state new med bag and new battery placed on Tuesday 01/14/15 and last for 1 week.   Pharmacy states if pt has an order placed by MD to continue home milrinone and use her home health pump, then pt can continue to have her own milrinone and pump be in use.    Curt Bears, [place] contacted and explained above with her, order to be written to keep pt home health milrinone and pump.

## 2015-01-16 NOTE — Discharge Summary (Signed)
ELECTROPHYSIOLOGY PROCEDURE DISCHARGE SUMMARY    Patient ID: Alexandra [name],  MRN: [id number], DOB/AGE: 79/21/1929 79 y.o.  Admit date: 01/16/2015 Discharge date: 01/17/2015  Primary Care Physician:  Melinda Crutch, MD Primary Cardiologist: [place] Electrophysiologist: Caryl Comes  Primary Discharge Diagnosis:  Chronic systolic heart failure status post cardiac resynchronization therapy pacemaker implantation this admission  Secondary Discharge Diagnosis:  1.  PE/bilateral DVT on Xarelto 2.  CKD stage 3 3.  LBBB 4.  Hypertension 5.  Hypothyroidism 6.  Peripheral neuropathy  Allergies  Allergen Reactions  . Amiodarone Shortness Of Breath and Nausea And Vomiting  . Cymbalta [Duloxetine Hcl] Other (See Comments)    Depression  . Lyrica [Pregabalin] Other (See Comments)    Blurry vision     Procedures This Admission:  1.  Implantation of a STJ CRTP on 01/16/15 by Dr Caryl Comes.  See op note for full details.  There were no immediate post procedure complications. 2.  CXR on 01/17/15 demonstrated no pneumothorax status post device implantation.   Brief HPI: TAHMINA [name] is a 81 y.o. female was referred to electrophysiology in the outpatient setting for consideration of CRTP implantation.  Past medical history is as outlined above. Due to chronic systolic heart failure, LBBB, and cardiomyopathy it was felt that the patient could benefit from CRT therapy.  Risks, benefits, and alternatives to PPM implantation were reviewed with the patient who wished to proceed.   Hospital Course:  The patient was admitted and underwent implantation of a STJ CRTP with details as outlined above.  She  was monitored on telemetry overnight which demonstrated sinus rhythm with V pacing.  Left chest was without hematoma or ecchymosis.  The device was interrogated and found to be functioning normally.  CXR was obtained and demonstrated no pneumothorax status post device implantation.  Wound care, arm  mobility, and restrictions were reviewed with the patient.  The patient was examined and considered stable for discharge to home.   Co-ox was obtained prior to discharge and will be followed up by HF clinic to decide about continuation or change in home Milrinone.     Physical Exam: Filed Vitals:   01/16/15 2014 01/16/15 2100 01/17/15 0252 01/17/15 0815  BP: 122/49 101/47  99/82  Pulse: 90 92 78 75  Temp:   97.4 F (36.3 C) 97.3 F (36.3 C)  TempSrc:   Axillary Oral  Resp: 15 21 16 17   Height:      Weight:   169 lb 5 oz (76.8 kg)   SpO2: 96% 94% 96% 96%    GEN- The patient is well appearing, alert and oriented x 3 today.   HEENT: normocephalic, atraumatic; sclera clear, conjunctiva pink; hearing intact; oropharynx clear; neck supple  Lungs- Clear to ausculation bilaterally, normal work of breathing.  No wheezes, rales, rhonchi Heart- Regular rate and rhythm (paced) 2/6 SEM GI- soft, non-tender, non-distended, bowel sounds present  Extremities- no clubbing, cyanosis, or edema  MS- no significant deformity or atrophy Skin- warm and dry, no rash or lesion, left chest without hematoma/ecchymosis Psych- euthymic mood, full affect Neuro- strength and sensation are intact   Labs:   Lab Results  Component Value Date   WBC 7.9 12/24/2014   HGB 11.5* 12/24/2014   HCT 35.2* 12/24/2014   MCV 92.4 12/24/2014   PLT 290 12/24/2014     Recent Labs Lab 01/17/15 0406  NA 140  K 3.7  CL 101  CO2 30  BUN 16  CREATININE 1.34*  CALCIUM 9.3  GLUCOSE 153*    Discharge Medications:    Medication List    TAKE these medications        acetaminophen 500 MG tablet  Commonly known as:  TYLENOL  Take 500 mg by mouth every 6 (six) hours as needed for mild pain or headache.     albuterol 108 (90 Base) MCG/ACT inhaler  Commonly known as:  PROVENTIL HFA;VENTOLIN HFA  Inhale 2 puffs into the lungs every 6 (six) hours as needed for wheezing or shortness of breath.     ALPRAZolam 0.25  MG tablet  Commonly known as:  XANAX  Take 0.25 mg by mouth 3 (three) times daily as needed for anxiety.     b complex vitamins tablet  Take 1 tablet by mouth daily with lunch.     calcium carbonate 500 MG chewable tablet  Commonly known as:  TUMS - dosed in mg elemental calcium  Chew 2 tablets by mouth daily as needed for indigestion or heartburn.     CALTRATE 600+D PO  Take 1 tablet by mouth daily with lunch.     cholecalciferol 1000 units tablet  Commonly known as:  VITAMIN D  Take 1,000 Units by mouth daily with lunch.     citalopram 20 MG tablet  Commonly known as:  CELEXA  Take 1 tablet (20 mg total) by mouth daily.     Digoxin 62.5 MCG Tabs  Take 0.0625 mg by mouth daily.     FIBERCON PO  Take 2.5-5 mLs by mouth daily as needed (constipation). Mix in 8 oz liquid and drink     furosemide 40 MG tablet  Commonly known as:  LASIX  Take 1 tablet (40 mg total) by mouth daily.     ivabradine 5 MG Tabs tablet  Commonly known as:  CORLANOR  Take 1 tablet (5 mg total) by mouth 2 (two) times daily with a meal.     lactose free nutrition Liqd  Take 237 mLs by mouth daily with breakfast.     levothyroxine 25 MCG tablet  Commonly known as:  SYNTHROID, LEVOTHROID  Take 25 mcg by mouth daily before breakfast. for thyroid     milrinone 20 MG/100ML Soln infusion  Commonly known as:  PRIMACOR  Inject 9.65 mcg/min into the vein continuous.     multivitamin with minerals Tabs tablet  Take 1 tablet by mouth daily with lunch.     mupirocin ointment 2 %  Commonly known as:  BACTROBAN  Place 1 application into the nose 2 (two) times daily. Use for 5 days     nitroGLYCERIN 0.4 MG SL tablet  Commonly known as:  NITROSTAT  Place 1 tablet (0.4 mg total) under the tongue every 5 (five) minutes as needed for chest pain.     OXYGEN  Inhale 2 L into the lungs daily as needed (shortness of breath).     pantoprazole 40 MG tablet  Commonly known as:  PROTONIX  Take 40 mg by mouth  daily.     potassium chloride 10 MEQ tablet  Commonly known as:  K-DUR  Take 1 tablet (10 mEq total) by mouth daily.     sodium chloride 0.9 % SOLN with milrinone 1 MG/ML SOLN 200 mcg/mL  Inject 0.125 mcg/kg/min into the vein continuous.     spironolactone 25 MG tablet  Commonly known as:  ALDACTONE  Take 1 tablet (25 mg total) by mouth daily.     SYSTANE OP  Place 1 drop  into both eyes 2 (two) times daily.     traMADol 50 MG tablet  Commonly known as:  ULTRAM  Take 1 tablet (50 mg total) by mouth every 6 (six) hours as needed for moderate pain.     XARELTO 20 MG Tabs tablet  Generic drug:  rivaroxaban  TAKE 1 TABLET BY MOUTH EVERY DAY        Disposition:  Discharge Instructions    Diet - low sodium heart healthy    Complete by:  As directed      Increase activity slowly    Complete by:  As directed           Follow-up Information    Follow up with [facility] On 01/27/2015.   Specialty:  Cardiology   Why:  at 12noon   Contact information:   [address], [place]      Follow up with Glori Bickers, MD On 01/31/2015.   Specialty:  Cardiology   Why:  at 940 for post hospital follow up. Please bring your medications. Code for parking is 1000.   Contact information:   [place] [address] [phone]       Follow up with Patsey Berthold, NP On 03/05/2015.   Specialty:  Cardiology   Why:  at [facility] information:   [place] [address] [phone]       Follow up with Virl Axe, MD On 04/18/2015.   Specialty:  Cardiology   Why:  at 11:15AM   Contact information:   [address] [address] [phone]       Duration of Discharge Encounter: Greater than 30 minutes including physician time.  Signed, Chanetta Marshall, NP 01/17/2015 9:01 AM

## 2015-01-17 ENCOUNTER — Telehealth (HOSPITAL_COMMUNITY): Payer: Self-pay | Admitting: *Deleted

## 2015-01-17 ENCOUNTER — Encounter (HOSPITAL_COMMUNITY): Payer: Self-pay | Admitting: Internal Medicine

## 2015-01-17 ENCOUNTER — Ambulatory Visit (HOSPITAL_COMMUNITY): Payer: Commercial Managed Care - HMO

## 2015-01-17 ENCOUNTER — Other Ambulatory Visit (HOSPITAL_COMMUNITY): Payer: Self-pay | Admitting: Internal Medicine

## 2015-01-17 DIAGNOSIS — I447 Left bundle-branch block, unspecified: Secondary | ICD-10-CM | POA: Diagnosis not present

## 2015-01-17 DIAGNOSIS — I428 Other cardiomyopathies: Secondary | ICD-10-CM | POA: Diagnosis not present

## 2015-01-17 DIAGNOSIS — N183 Chronic kidney disease, stage 3 (moderate): Secondary | ICD-10-CM | POA: Diagnosis not present

## 2015-01-17 DIAGNOSIS — E669 Obesity, unspecified: Secondary | ICD-10-CM | POA: Diagnosis not present

## 2015-01-17 DIAGNOSIS — I130 Hypertensive heart and chronic kidney disease with heart failure and stage 1 through stage 4 chronic kidney disease, or unspecified chronic kidney disease: Secondary | ICD-10-CM | POA: Diagnosis not present

## 2015-01-17 DIAGNOSIS — Z950 Presence of cardiac pacemaker: Secondary | ICD-10-CM | POA: Diagnosis not present

## 2015-01-17 DIAGNOSIS — I5022 Chronic systolic (congestive) heart failure: Secondary | ICD-10-CM | POA: Diagnosis not present

## 2015-01-17 DIAGNOSIS — E039 Hypothyroidism, unspecified: Secondary | ICD-10-CM | POA: Diagnosis not present

## 2015-01-17 DIAGNOSIS — Z6826 Body mass index (BMI) 26.0-26.9, adult: Secondary | ICD-10-CM | POA: Diagnosis not present

## 2015-01-17 DIAGNOSIS — J45909 Unspecified asthma, uncomplicated: Secondary | ICD-10-CM | POA: Diagnosis not present

## 2015-01-17 LAB — BASIC METABOLIC PANEL
Anion gap: 9 (ref 5–15)
BUN: 16 mg/dL (ref 6–20)
CO2: 30 mmol/L (ref 22–32)
CREATININE: 1.34 mg/dL — AB (ref 0.44–1.00)
Calcium: 9.3 mg/dL (ref 8.9–10.3)
Chloride: 101 mmol/L (ref 101–111)
GFR calc Af Amer: 41 mL/min — ABNORMAL LOW (ref >60)
GFR, EST NON AFRICAN AMERICAN: 35 mL/min — AB (ref >60)
GLUCOSE: 153 mg/dL — AB (ref 65–99)
Potassium: 3.7 mmol/L (ref 3.5–5.1)
SODIUM: 140 mmol/L (ref 135–145)

## 2015-01-17 LAB — MAGNESIUM: MAGNESIUM: 1.7 mg/dL (ref 1.7–2.4)

## 2015-01-17 MED ORDER — MUPIROCIN 2 % EX OINT
1.0000 "application " | TOPICAL_OINTMENT | Freq: Two times a day (BID) | CUTANEOUS | Status: DC
  Administered 2015-01-17: 1 via NASAL

## 2015-01-17 MED ORDER — MAGNESIUM SULFATE 2 GM/50ML IV SOLN
2.0000 g | Freq: Once | INTRAVENOUS | Status: AC
  Administered 2015-01-17: 2 g via INTRAVENOUS
  Filled 2015-01-17: qty 50

## 2015-01-17 MED ORDER — POTASSIUM CHLORIDE CRYS ER 20 MEQ PO TBCR
20.0000 meq | EXTENDED_RELEASE_TABLET | Freq: Once | ORAL | Status: AC
  Administered 2015-01-17: 06:00:00 20 meq via ORAL
  Filled 2015-01-17: qty 1

## 2015-01-17 MED ORDER — HEPARIN SOD (PORK) LOCK FLUSH 100 UNIT/ML IV SOLN
250.0000 [IU] | INTRAVENOUS | Status: AC | PRN
  Administered 2015-01-17: 250 [IU]

## 2015-01-17 MED ORDER — CHLORHEXIDINE GLUCONATE CLOTH 2 % EX PADS
6.0000 | MEDICATED_PAD | Freq: Every day | CUTANEOUS | Status: DC
  Administered 2015-01-17: 06:00:00 6 via TOPICAL

## 2015-01-17 MED ORDER — MUPIROCIN 2 % EX OINT: 1.0000 "application " | TOPICAL_OINTMENT | Freq: Two times a day (BID) | CUTANEOUS | Status: DC

## 2015-01-17 MED FILL — Heparin Sodium (Porcine) 2 Unit/ML in Sodium Chloride 0.9%: INTRAMUSCULAR | Qty: 500 | Status: AC

## 2015-01-17 MED FILL — Sodium Chloride Irrigation Soln 0.9%: Qty: 500 | Status: AC

## 2015-01-17 MED FILL — Gentamicin Sulfate Inj 40 MG/ML: INTRAMUSCULAR | Qty: 2 | Status: AC

## 2015-01-17 NOTE — Telephone Encounter (Signed)
Ok more visits for ms Moffit for home health

## 2015-01-17 NOTE — Progress Notes (Signed)
Patient have 2 episode run of v-tach while asleep 20 beat run and 7 beat run. Patient denies any symptoms, BP stable Dr. Alejandro Mulling made aware with orders made. Will follow up blood work and continue to monitor.

## 2015-01-17 NOTE — Progress Notes (Signed)
Patient Name: Alexandra [name]      SUBJECTIVE:* mcuh better this am Tel some VT     Past Medical History  Diagnosis Date  . Arthritis   . Headache(784.0)   . Anxiety   . Depression   . Asthma   . CAP (community acquired pneumonia) 12/19/2012  . Intermittent LBBB (left bundle branch block) 12/21/2012  . Obesity   . Peripheral neuropathy ([place])   . Sacroiliitis, not elsewhere classified ([place])   . Osteopenia   . Hypercholesteremia   . Neuropathy ([place])   . Trigger finger     [name]  . Chest pain     NM stress test 05/2011 Normal  . DOE (dyspnea on exertion)     No SOB at rest  . Fatigue     excertional  . breast ca dx'd 2000    left  . Essential (primary) hypertension 04/02/2014  . Adult hypothyroidism 04/02/2014  . Hereditary and idiopathic peripheral neuropathy 07/02/2014  . Leg weakness   . CKD (chronic kidney disease) stage 3, GFR 30-59 ml/min 08/13/2014  . Anemia of chronic disease 08/13/2014  . CHF (congestive heart failure) ([place]) 08/19/2014  . Situational depression     Scheduled Meds:  Scheduled Meds: . B-complex with vitamin C  1 tablet Oral Q lunch  .  ceFAZolin (ANCEF) IV  1 g Intravenous Q6H  . Chlorhexidine Gluconate Cloth  6 each Topical Q0600  . cholecalciferol  1,000 Units Oral Q lunch  . citalopram  20 mg Oral Daily  . digoxin  0.0625 mg Oral Daily  . furosemide  40 mg Oral Daily  . ivabradine  5 mg Oral BID WC  . lactose free nutrition  237 mL Oral Q breakfast  . levothyroxine  25 mcg Oral QAC breakfast  . multivitamin with minerals  1 tablet Oral Q lunch  . mupirocin ointment  1 application Nasal BID  . pantoprazole  40 mg Oral Daily  . potassium chloride  10 mEq Oral Daily  . rivaroxaban  20 mg Oral Daily  . sodium chloride  10-40 mL Intracatheter Q12H  . spironolactone  25 mg Oral Daily   Continuous Infusions:  acetaminophen, albuterol, ALPRAZolam, calcium carbonate, nitroGLYCERIN, ondansetron (ZOFRAN) IV, sodium chloride,  traMADol    PHYSICAL EXAM Filed Vitals:   01/16/15 1909 01/16/15 2014 01/16/15 2100 01/17/15 0252  BP: 128/58 122/49 101/47   Pulse: 87 90 92 78  Temp: 97.7 F (36.5 C)   97.4 F (36.3 C)  TempSrc: Axillary   Axillary  Resp: 16 15 21 16   Height:      Weight:    169 lb 5 oz (76.8 kg)  SpO2: 94% 96% 94% 96%    Well developed and nourished in no acute distress HENT normal Neck supple with JVP-flat Clear Pocket without hematoma, swelling or tenderness  Regular rate and rhythm, no murmurs or gallops Abd-soft with active BS No Clubbing cyanosis edema Skin-warm and dry A & Oriented  Grossly normal sensory and motor function   TELEMETRY: Reviewed telemetry pt in P-synchronous/ AV  pacing wotj some coupled ectopy and some wide complex tachycardia not sure but suspect SVT:    Intake/Output Summary (Last 24 hours) at 01/17/15 0816 Last data filed at 01/17/15 0625  Gross per 24 hour  Intake      0 ml  Output   1100 ml  Net  -1100 ml    LABS: Basic Metabolic Panel:  Recent  Labs Lab 01/17/15 0406  NA 140  K 3.7  CL 101  CO2 30  GLUCOSE 153*  BUN 16  CREATININE 1.34*  CALCIUM 9.3  MG 1.7   Cardiac Enzymes: No results for input(s): CKTOTAL, CKMB, CKMBINDEX, TROPONINI in the last 72 hours. CBC: No results for input(s): WBC, NEUTROABS, HGB, HCT, MCV, PLT in the last 168 hours. PROTIME: No results for input(s): LABPROT, INR in the last 72 hours. Liver Function Tests: No results for input(s): AST, ALT, ALKPHOS, BILITOT, PROT, ALBUMIN in the last 72 hours. No results for input(s): LIPASE, AMYLASE in the last 72 hours. BNP: BNP (last 3 results)  Recent Labs  08/16/14 0855 08/19/14 1523 10/11/14 1459  BNP 1169.9* 1627.6* 2298.4*    ProBNP (last 3 results)  Recent Labs  09/25/14 1332  PROBNP 2219.0*       Device Interrogation: High outputs as previously noted Will use current polse 2-RV     ASSESSMENT AND PLAN:  Active Problems:   Chronic  systolic CHF (congestive heart failure), NYHA class 3 (HCC) sinus tach   Improved this am, slower HR is a great sign willl need early followup with CHF clinic  To decide re stopping milrinone  insturctions given  Resume Rivaroxaban tonight   Signed, Virl Axe MD  01/17/2015

## 2015-01-17 NOTE — Discharge Instructions (Addendum)
Supplemental Discharge Instructions for  Pacemaker/Defibrillator Patients  Activity No heavy lifting or vigorous activity with your left/right arm for 6 to 8 weeks.  Do not raise your left/right arm above your head for one week.  Gradually raise your affected arm as drawn below.           __      01/21/15                     01/22/15                    01/23/15                    01/23/15  NO DRIVING for  1 week   ; you may begin driving on  [id number]   .  WOUND CARE - Keep the wound area clean and dry.  Do not get this area wet for 2 days. You may shower on   01/19/15  . - The tape/steri-strips on your wound will fall off; do not pull them off.  No bandage is needed on the site.  DO  NOT apply any creams, oils, or ointments to the wound area. - If you notice any drainage or discharge from the wound, any swelling or bruising at the site, or you develop a fever > 101? F after you are discharged home, call the office at once.  Special Instructions - You are still able to use cellular telephones; use the ear opposite the side where you have your pacemaker/defibrillator.  Avoid carrying your cellular phone near your device. - When traveling through airports, show security personnel your identification card to avoid being screened in the metal detectors.  Ask the security personnel to use the hand wand. - Avoid arc welding equipment, MRI testing (magnetic resonance imaging), TENS units (transcutaneous nerve stimulators).  Call the office for questions about other devices. - Avoid electrical appliances that are in poor condition or are not properly grounded. - Microwave ovens are safe to be near or to operate.      Information on my medicine - XARELTO (rivaroxaban)  This medication education was reviewed with me or my healthcare representative as part of my discharge preparation.  The pharmacist that spoke with me during my hospital stay was:  Wayland Salinas, [place]? Xarelto was prescribed to treat blood clots that may have been found in the veins of your legs (deep vein thrombosis) or in your lungs (pulmonary embolism) and to reduce the risk of them occurring again.  What do you need to know about Xarelto? The dose is one 20 mg tablet taken ONCE A DAY with your evening meal.  DO NOT stop taking Xarelto without talking to the health care provider who prescribed the medication.  Refill your prescription for 20 mg tablets before you run out.  After discharge, you should have regular check-up appointments with your healthcare provider that is prescribing your Xarelto.  In the future your dose may need to be changed if your kidney function changes by a significant amount.  What do you do if you miss a dose? If you are taking Xarelto TWICE DAILY and you miss a dose, take it as soon as you remember. You may take two 15 mg tablets (total 30 mg) at the same time then resume your regularly scheduled 15 mg twice daily the next day.  If you are taking  Xarelto ONCE DAILY and you miss a dose, take it as soon as you remember on the same day then continue your regularly scheduled once daily regimen the next day. Do not take two doses of Xarelto at the same time.   Important Safety Information Xarelto is a blood thinner medicine that can cause bleeding. You should call your healthcare provider right away if you experience any of the following: ? Bleeding from an injury or your nose that does not stop. ? Unusual colored urine (red or dark brown) or unusual colored stools (red or black). ? Unusual bruising for unknown reasons. ? A serious fall or if you hit your head (even if there is no bleeding).  Some medicines may interact with Xarelto and might increase your risk of bleeding while on Xarelto. To help avoid this, consult your healthcare provider or pharmacist prior to using any new prescription or non-prescription medications, including  herbals, vitamins, non-steroidal anti-inflammatory drugs (NSAIDs) and supplements.  This website has more information on Xarelto: [email or web address]

## 2015-01-17 NOTE — Care Management Note (Addendum)
Case Management Note  Patient Details  Name: CLADIE RAMBERG MRN: [id number] Date of Birth: Jun 30, 1927  Subjective/Objective: Chronic systolic heart failure status post cardiac resynchronization therapy pacemaker implantation this admission.                   Action/Plan: CM did call [facility] to make them aware that pt will be d/c today with [facility]. SOC to begin within 24-48 hrs post d/c. Iv Milrinone connected to pt with pump. No further needs from CM @ this time.    Expected Discharge Date:                  Expected Discharge Plan:  [place]  In-House Referral:  NA  Discharge planning Services  CM Consult  Post Acute Care Choice:  Home Health, Resumption of Svcs/PTA Provider Choice offered to:  Patient  DME Arranged:  IV pump/equipment DME Agency:  [place]:  RN [facility] Agency:  [place]  Status of Service:  Completed, signed off  Medicare Important Message Given:    Date Medicare IM Given:    Medicare IM give by:    Date Additional Medicare IM Given:    Additional Medicare Important Message give by:     If discussed at [place] of Stay Meetings, dates discussed:    Additional Comments:  Bethena Roys, RN 01/17/2015, 10:06 AM

## 2015-01-18 DIAGNOSIS — Z48812 Encounter for surgical aftercare following surgery on the circulatory system: Secondary | ICD-10-CM | POA: Diagnosis not present

## 2015-01-18 DIAGNOSIS — Z950 Presence of cardiac pacemaker: Secondary | ICD-10-CM | POA: Diagnosis not present

## 2015-01-18 DIAGNOSIS — N183 Chronic kidney disease, stage 3 (moderate): Secondary | ICD-10-CM | POA: Diagnosis not present

## 2015-01-18 DIAGNOSIS — I5023 Acute on chronic systolic (congestive) heart failure: Secondary | ICD-10-CM | POA: Diagnosis not present

## 2015-01-18 DIAGNOSIS — I130 Hypertensive heart and chronic kidney disease with heart failure and stage 1 through stage 4 chronic kidney disease, or unspecified chronic kidney disease: Secondary | ICD-10-CM | POA: Diagnosis not present

## 2015-01-18 DIAGNOSIS — Z452 Encounter for adjustment and management of vascular access device: Secondary | ICD-10-CM | POA: Diagnosis not present

## 2015-01-18 DIAGNOSIS — E669 Obesity, unspecified: Secondary | ICD-10-CM | POA: Diagnosis not present

## 2015-01-18 DIAGNOSIS — F329 Major depressive disorder, single episode, unspecified: Secondary | ICD-10-CM | POA: Diagnosis not present

## 2015-01-18 DIAGNOSIS — G609 Hereditary and idiopathic neuropathy, unspecified: Secondary | ICD-10-CM | POA: Diagnosis not present

## 2015-01-19 DIAGNOSIS — Z452 Encounter for adjustment and management of vascular access device: Secondary | ICD-10-CM | POA: Diagnosis not present

## 2015-01-19 DIAGNOSIS — J189 Pneumonia, unspecified organism: Secondary | ICD-10-CM | POA: Diagnosis not present

## 2015-01-19 DIAGNOSIS — I130 Hypertensive heart and chronic kidney disease with heart failure and stage 1 through stage 4 chronic kidney disease, or unspecified chronic kidney disease: Secondary | ICD-10-CM | POA: Diagnosis not present

## 2015-01-19 DIAGNOSIS — N183 Chronic kidney disease, stage 3 (moderate): Secondary | ICD-10-CM | POA: Diagnosis not present

## 2015-01-19 DIAGNOSIS — Z950 Presence of cardiac pacemaker: Secondary | ICD-10-CM | POA: Diagnosis not present

## 2015-01-19 DIAGNOSIS — F329 Major depressive disorder, single episode, unspecified: Secondary | ICD-10-CM | POA: Diagnosis not present

## 2015-01-19 DIAGNOSIS — Z48812 Encounter for surgical aftercare following surgery on the circulatory system: Secondary | ICD-10-CM | POA: Diagnosis not present

## 2015-01-19 DIAGNOSIS — I2699 Other pulmonary embolism without acute cor pulmonale: Secondary | ICD-10-CM | POA: Diagnosis not present

## 2015-01-19 DIAGNOSIS — G609 Hereditary and idiopathic neuropathy, unspecified: Secondary | ICD-10-CM | POA: Diagnosis not present

## 2015-01-19 DIAGNOSIS — I5043 Acute on chronic combined systolic (congestive) and diastolic (congestive) heart failure: Secondary | ICD-10-CM | POA: Diagnosis not present

## 2015-01-19 DIAGNOSIS — E669 Obesity, unspecified: Secondary | ICD-10-CM | POA: Diagnosis not present

## 2015-01-19 DIAGNOSIS — I5023 Acute on chronic systolic (congestive) heart failure: Secondary | ICD-10-CM | POA: Diagnosis not present

## 2015-01-20 ENCOUNTER — Telehealth: Payer: Self-pay | Admitting: Cardiology

## 2015-01-20 NOTE — Telephone Encounter (Signed)
Pt called complaining of leg pain and wanted to know if it was related to her pacemaker. It sounded like sciatica to me. Advised to try the Tramadol she has at home.  Kerin Ransom PA-C 01/20/2015 6:33 PM

## 2015-01-21 DIAGNOSIS — N183 Chronic kidney disease, stage 3 (moderate): Secondary | ICD-10-CM | POA: Diagnosis not present

## 2015-01-21 DIAGNOSIS — Z950 Presence of cardiac pacemaker: Secondary | ICD-10-CM | POA: Diagnosis not present

## 2015-01-21 DIAGNOSIS — E669 Obesity, unspecified: Secondary | ICD-10-CM | POA: Diagnosis not present

## 2015-01-21 DIAGNOSIS — Z48812 Encounter for surgical aftercare following surgery on the circulatory system: Secondary | ICD-10-CM | POA: Diagnosis not present

## 2015-01-21 DIAGNOSIS — J189 Pneumonia, unspecified organism: Secondary | ICD-10-CM | POA: Diagnosis not present

## 2015-01-21 DIAGNOSIS — I2699 Other pulmonary embolism without acute cor pulmonale: Secondary | ICD-10-CM | POA: Diagnosis not present

## 2015-01-21 DIAGNOSIS — G609 Hereditary and idiopathic neuropathy, unspecified: Secondary | ICD-10-CM | POA: Diagnosis not present

## 2015-01-21 DIAGNOSIS — I5043 Acute on chronic combined systolic (congestive) and diastolic (congestive) heart failure: Secondary | ICD-10-CM | POA: Diagnosis not present

## 2015-01-21 DIAGNOSIS — I130 Hypertensive heart and chronic kidney disease with heart failure and stage 1 through stage 4 chronic kidney disease, or unspecified chronic kidney disease: Secondary | ICD-10-CM | POA: Diagnosis not present

## 2015-01-21 DIAGNOSIS — I429 Cardiomyopathy, unspecified: Secondary | ICD-10-CM | POA: Diagnosis not present

## 2015-01-21 DIAGNOSIS — F329 Major depressive disorder, single episode, unspecified: Secondary | ICD-10-CM | POA: Diagnosis not present

## 2015-01-21 DIAGNOSIS — Z452 Encounter for adjustment and management of vascular access device: Secondary | ICD-10-CM | POA: Diagnosis not present

## 2015-01-21 DIAGNOSIS — I5023 Acute on chronic systolic (congestive) heart failure: Secondary | ICD-10-CM | POA: Diagnosis not present

## 2015-01-22 ENCOUNTER — Telehealth (HOSPITAL_COMMUNITY): Payer: Self-pay | Admitting: *Deleted

## 2015-01-22 ENCOUNTER — Telehealth (HOSPITAL_COMMUNITY): Payer: Self-pay | Admitting: Cardiology

## 2015-01-22 MED ORDER — MAGNESIUM 200 MG PO TABS: 200.0000 mg | ORAL_TABLET | Freq: Every day | ORAL | Status: DC

## 2015-01-22 NOTE — Telephone Encounter (Signed)
Have had 2 VM from Amaya today and a VM from pt's daughter.  The message are all regarding pt having leg pain and wanting a refill of Xanax, as they feel this will help calm pt down.  Dr Haroldine Laws states he will not refill Xanax she needs to get from PCP.    I called and spoke w/pt's daughter she states sit is pt's left leg at calf to ankle hurst and pain shoots up leg, denies swelling, redness, bruising or injury.  She states pt has been taking Tramadol and it does help with pain.  Advised does not sound like dvt, pt should contact PCP for further eval and refill of Xanax, pt's daughter aware and agreeable and states she has left them a message as well.  I called and spoke w/Katlyn [facility] [phone]), she is aware we advised pt to contact pcp, she does ask for orders to have pt's PT resumed, gave VO

## 2015-01-22 NOTE — Telephone Encounter (Signed)
Abnormal results from [facility] Mg- 1.6  Per Oda Kilts, PA Patient should start Mag 200 mg, one daily  Patient aware and voiced udnerstanding

## 2015-01-23 DIAGNOSIS — M5416 Radiculopathy, lumbar region: Secondary | ICD-10-CM | POA: Diagnosis not present

## 2015-01-24 DIAGNOSIS — G609 Hereditary and idiopathic neuropathy, unspecified: Secondary | ICD-10-CM | POA: Diagnosis not present

## 2015-01-24 DIAGNOSIS — F329 Major depressive disorder, single episode, unspecified: Secondary | ICD-10-CM | POA: Diagnosis not present

## 2015-01-24 DIAGNOSIS — E669 Obesity, unspecified: Secondary | ICD-10-CM | POA: Diagnosis not present

## 2015-01-24 DIAGNOSIS — Z452 Encounter for adjustment and management of vascular access device: Secondary | ICD-10-CM | POA: Diagnosis not present

## 2015-01-24 DIAGNOSIS — N183 Chronic kidney disease, stage 3 (moderate): Secondary | ICD-10-CM | POA: Diagnosis not present

## 2015-01-24 DIAGNOSIS — Z950 Presence of cardiac pacemaker: Secondary | ICD-10-CM | POA: Diagnosis not present

## 2015-01-24 DIAGNOSIS — I130 Hypertensive heart and chronic kidney disease with heart failure and stage 1 through stage 4 chronic kidney disease, or unspecified chronic kidney disease: Secondary | ICD-10-CM | POA: Diagnosis not present

## 2015-01-24 DIAGNOSIS — Z48812 Encounter for surgical aftercare following surgery on the circulatory system: Secondary | ICD-10-CM | POA: Diagnosis not present

## 2015-01-24 DIAGNOSIS — I5023 Acute on chronic systolic (congestive) heart failure: Secondary | ICD-10-CM | POA: Diagnosis not present

## 2015-01-27 ENCOUNTER — Other Ambulatory Visit (HOSPITAL_COMMUNITY): Payer: Self-pay | Admitting: Internal Medicine

## 2015-01-27 ENCOUNTER — Other Ambulatory Visit: Payer: Self-pay | Admitting: *Deleted

## 2015-01-27 ENCOUNTER — Ambulatory Visit: Payer: Commercial Managed Care - HMO

## 2015-01-27 DIAGNOSIS — Z950 Presence of cardiac pacemaker: Secondary | ICD-10-CM | POA: Diagnosis not present

## 2015-01-27 DIAGNOSIS — E669 Obesity, unspecified: Secondary | ICD-10-CM | POA: Diagnosis not present

## 2015-01-27 DIAGNOSIS — Z48812 Encounter for surgical aftercare following surgery on the circulatory system: Secondary | ICD-10-CM | POA: Diagnosis not present

## 2015-01-27 DIAGNOSIS — Z452 Encounter for adjustment and management of vascular access device: Secondary | ICD-10-CM | POA: Diagnosis not present

## 2015-01-27 DIAGNOSIS — G609 Hereditary and idiopathic neuropathy, unspecified: Secondary | ICD-10-CM | POA: Diagnosis not present

## 2015-01-27 DIAGNOSIS — N183 Chronic kidney disease, stage 3 (moderate): Secondary | ICD-10-CM | POA: Diagnosis not present

## 2015-01-27 DIAGNOSIS — I130 Hypertensive heart and chronic kidney disease with heart failure and stage 1 through stage 4 chronic kidney disease, or unspecified chronic kidney disease: Secondary | ICD-10-CM | POA: Diagnosis not present

## 2015-01-27 DIAGNOSIS — F329 Major depressive disorder, single episode, unspecified: Secondary | ICD-10-CM | POA: Diagnosis not present

## 2015-01-27 DIAGNOSIS — I5023 Acute on chronic systolic (congestive) heart failure: Secondary | ICD-10-CM | POA: Diagnosis not present

## 2015-01-27 NOTE — Patient Outreach (Signed)
Today I have been able to catch up with Alexandra [name] who reports she had her pacermaker put in and then several days later started having symptoms of sciatica which she is getting treatment for (prednisone). She has 5 children that have all contributed to assisting her. She continues to have [facility] providing weekly IV services, PT and a bath aid. She asked me today if there is any way she could go to a rehab to give her family a break.  I then talked to Alexandra [name], pt's daughter-in-law and she also brought me up to date on my situation but also referred me to speak to Alexandra [name], the daughter that does all the medical coordination.  She and I discussed that there has been a lot of confusion on who is doing what and that having another agency involved may further complicate the situation. She is willing to talk with me at there mother's home on Thursday. I will call and see if I can visit in the am or pm that day.  [name] [facility] [phone]

## 2015-01-28 DIAGNOSIS — I2699 Other pulmonary embolism without acute cor pulmonale: Secondary | ICD-10-CM | POA: Diagnosis not present

## 2015-01-28 DIAGNOSIS — F329 Major depressive disorder, single episode, unspecified: Secondary | ICD-10-CM | POA: Diagnosis not present

## 2015-01-28 DIAGNOSIS — Z452 Encounter for adjustment and management of vascular access device: Secondary | ICD-10-CM | POA: Diagnosis not present

## 2015-01-28 DIAGNOSIS — I5023 Acute on chronic systolic (congestive) heart failure: Secondary | ICD-10-CM | POA: Diagnosis not present

## 2015-01-28 DIAGNOSIS — I429 Cardiomyopathy, unspecified: Secondary | ICD-10-CM | POA: Diagnosis not present

## 2015-01-28 DIAGNOSIS — E669 Obesity, unspecified: Secondary | ICD-10-CM | POA: Diagnosis not present

## 2015-01-28 DIAGNOSIS — I5043 Acute on chronic combined systolic (congestive) and diastolic (congestive) heart failure: Secondary | ICD-10-CM | POA: Diagnosis not present

## 2015-01-28 DIAGNOSIS — N183 Chronic kidney disease, stage 3 (moderate): Secondary | ICD-10-CM | POA: Diagnosis not present

## 2015-01-28 DIAGNOSIS — Z48812 Encounter for surgical aftercare following surgery on the circulatory system: Secondary | ICD-10-CM | POA: Diagnosis not present

## 2015-01-28 DIAGNOSIS — J189 Pneumonia, unspecified organism: Secondary | ICD-10-CM | POA: Diagnosis not present

## 2015-01-28 DIAGNOSIS — Z950 Presence of cardiac pacemaker: Secondary | ICD-10-CM | POA: Diagnosis not present

## 2015-01-28 DIAGNOSIS — G609 Hereditary and idiopathic neuropathy, unspecified: Secondary | ICD-10-CM | POA: Diagnosis not present

## 2015-01-28 DIAGNOSIS — I130 Hypertensive heart and chronic kidney disease with heart failure and stage 1 through stage 4 chronic kidney disease, or unspecified chronic kidney disease: Secondary | ICD-10-CM | POA: Diagnosis not present

## 2015-01-29 ENCOUNTER — Telehealth (HOSPITAL_COMMUNITY): Payer: Self-pay | Admitting: Cardiology

## 2015-01-29 DIAGNOSIS — Z950 Presence of cardiac pacemaker: Secondary | ICD-10-CM | POA: Diagnosis not present

## 2015-01-29 DIAGNOSIS — I5023 Acute on chronic systolic (congestive) heart failure: Secondary | ICD-10-CM | POA: Diagnosis not present

## 2015-01-29 DIAGNOSIS — G609 Hereditary and idiopathic neuropathy, unspecified: Secondary | ICD-10-CM | POA: Diagnosis not present

## 2015-01-29 DIAGNOSIS — F329 Major depressive disorder, single episode, unspecified: Secondary | ICD-10-CM | POA: Diagnosis not present

## 2015-01-29 DIAGNOSIS — N183 Chronic kidney disease, stage 3 (moderate): Secondary | ICD-10-CM | POA: Diagnosis not present

## 2015-01-29 DIAGNOSIS — I130 Hypertensive heart and chronic kidney disease with heart failure and stage 1 through stage 4 chronic kidney disease, or unspecified chronic kidney disease: Secondary | ICD-10-CM | POA: Diagnosis not present

## 2015-01-29 DIAGNOSIS — E669 Obesity, unspecified: Secondary | ICD-10-CM | POA: Diagnosis not present

## 2015-01-29 DIAGNOSIS — Z48812 Encounter for surgical aftercare following surgery on the circulatory system: Secondary | ICD-10-CM | POA: Diagnosis not present

## 2015-01-29 DIAGNOSIS — Z452 Encounter for adjustment and management of vascular access device: Secondary | ICD-10-CM | POA: Diagnosis not present

## 2015-01-29 NOTE — Telephone Encounter (Signed)
ABNORMAL LABS FROM [facility] WBC 12.3 HEMOGLOBIN 11.2 HCT 33.8  PER VO ANDY TILLERY, PA PATIENT WILL NEED BLOOD CULTURES X 2 STAT. IF POSITIVE WILL NEED TO COME INTO HOSPITAL FOR FURTHER EVALUATION IF PATIENT REPORTS FEVER/CHILLS, REPORT TO ED   Patient states she feels wonderful. Denies fever or chills Patient would prefer if nurse through [facility] came to home for cultures.  Order sent to [facility]

## 2015-01-30 ENCOUNTER — Ambulatory Visit (INDEPENDENT_AMBULATORY_CARE_PROVIDER_SITE_OTHER): Payer: Commercial Managed Care - HMO | Admitting: *Deleted

## 2015-01-30 ENCOUNTER — Encounter: Payer: Self-pay | Admitting: Internal Medicine

## 2015-01-30 DIAGNOSIS — E669 Obesity, unspecified: Secondary | ICD-10-CM | POA: Diagnosis not present

## 2015-01-30 DIAGNOSIS — I130 Hypertensive heart and chronic kidney disease with heart failure and stage 1 through stage 4 chronic kidney disease, or unspecified chronic kidney disease: Secondary | ICD-10-CM | POA: Diagnosis not present

## 2015-01-30 DIAGNOSIS — Z48812 Encounter for surgical aftercare following surgery on the circulatory system: Secondary | ICD-10-CM | POA: Diagnosis not present

## 2015-01-30 DIAGNOSIS — I447 Left bundle-branch block, unspecified: Secondary | ICD-10-CM

## 2015-01-30 DIAGNOSIS — G609 Hereditary and idiopathic neuropathy, unspecified: Secondary | ICD-10-CM | POA: Diagnosis not present

## 2015-01-30 DIAGNOSIS — Z950 Presence of cardiac pacemaker: Secondary | ICD-10-CM | POA: Diagnosis not present

## 2015-01-30 DIAGNOSIS — I5022 Chronic systolic (congestive) heart failure: Secondary | ICD-10-CM

## 2015-01-30 DIAGNOSIS — Z452 Encounter for adjustment and management of vascular access device: Secondary | ICD-10-CM | POA: Diagnosis not present

## 2015-01-30 DIAGNOSIS — N183 Chronic kidney disease, stage 3 (moderate): Secondary | ICD-10-CM | POA: Diagnosis not present

## 2015-01-30 DIAGNOSIS — I5023 Acute on chronic systolic (congestive) heart failure: Secondary | ICD-10-CM | POA: Diagnosis not present

## 2015-01-30 DIAGNOSIS — F329 Major depressive disorder, single episode, unspecified: Secondary | ICD-10-CM | POA: Diagnosis not present

## 2015-01-30 LAB — CUP PACEART INCLINIC DEVICE CHECK
Battery Remaining Longevity: 45.6
Battery Voltage: 3.01 V
Implantable Lead Implant Date: 20161229
Implantable Lead Location: 753859
Implantable Lead Location: 753860
Lead Channel Impedance Value: 425 Ohm
Lead Channel Impedance Value: 550 Ohm
Lead Channel Pacing Threshold Amplitude: 0.75 V
Lead Channel Pacing Threshold Amplitude: 0.75 V
Lead Channel Pacing Threshold Amplitude: 1.75 V
Lead Channel Pacing Threshold Pulse Width: 0.5 ms
Lead Channel Pacing Threshold Pulse Width: 1 ms
Lead Channel Pacing Threshold Pulse Width: 1 ms
Lead Channel Sensing Intrinsic Amplitude: 12 mV
Lead Channel Setting Pacing Amplitude: 2 V
Lead Channel Setting Pacing Amplitude: 3 V
Lead Channel Setting Pacing Pulse Width: 0.5 ms
Lead Channel Setting Pacing Pulse Width: 1 ms
MDC IDC LEAD IMPLANT DT: 20161229
MDC IDC LEAD IMPLANT DT: 20161229
MDC IDC LEAD LOCATION: 753858
MDC IDC MSMT LEADCHNL LV IMPEDANCE VALUE: 662.5 Ohm
MDC IDC MSMT LEADCHNL LV PACING THRESHOLD AMPLITUDE: 1.75 V
MDC IDC MSMT LEADCHNL RA PACING THRESHOLD AMPLITUDE: 0.75 V
MDC IDC MSMT LEADCHNL RA PACING THRESHOLD AMPLITUDE: 0.75 V
MDC IDC MSMT LEADCHNL RA PACING THRESHOLD PULSEWIDTH: 0.5 ms
MDC IDC MSMT LEADCHNL RA SENSING INTR AMPL: 5 mV
MDC IDC MSMT LEADCHNL RV PACING THRESHOLD PULSEWIDTH: 0.5 ms
MDC IDC MSMT LEADCHNL RV PACING THRESHOLD PULSEWIDTH: 0.5 ms
MDC IDC PG SERIAL: 7834528
MDC IDC SESS DTM: 20170112112121
MDC IDC SET LEADCHNL RA PACING AMPLITUDE: 1.625
MDC IDC SET LEADCHNL RV SENSING SENSITIVITY: 2 mV
MDC IDC STAT BRADY RA PERCENT PACED: 6.6 %
MDC IDC STAT BRADY RV PERCENT PACED: 99 %

## 2015-01-30 NOTE — Progress Notes (Signed)
Wound check appointment s/p CRTP placement 01/16/15. Dermabond present- some able to be removed today. Patient instructed to use washcloth to gently remove "glue" while bathing. Wound without redness or edema. Incision edges approximated, wound well healed. Normal device function. Thresholds, sensing, and impedances consistent with implant measurements. Device programmed with auto capture on for extra safety margin until 3 month visit. Histogram distribution appropriate for patient and level of activity. 99% BiV pacing. 4 mode switches- longest 3:40, pk A 187bpm. 3 high ventricular rates noted- SVT. Patient educated about wound care, arm mobility, lifting restrictions. ROV with AS 03/05/15 and SK 04/18/15.

## 2015-01-31 ENCOUNTER — Encounter (HOSPITAL_COMMUNITY): Payer: Self-pay | Admitting: Internal Medicine

## 2015-01-31 ENCOUNTER — Other Ambulatory Visit (HOSPITAL_COMMUNITY): Payer: Self-pay | Admitting: Internal Medicine

## 2015-01-31 ENCOUNTER — Telehealth: Payer: Self-pay | Admitting: Physician Assistant

## 2015-01-31 ENCOUNTER — Ambulatory Visit (HOSPITAL_COMMUNITY)
Admission: RE | Admit: 2015-01-31 | Discharge: 2015-01-31 | Disposition: A | Discharge status: Home / Self Care | Payer: Commercial Managed Care - HMO | Source: Ambulatory Visit | Attending: Internal Medicine | Admitting: Internal Medicine

## 2015-01-31 ENCOUNTER — Telehealth (HOSPITAL_COMMUNITY): Payer: Self-pay

## 2015-01-31 ENCOUNTER — Telehealth (HOSPITAL_COMMUNITY): Payer: Self-pay | Admitting: *Deleted

## 2015-01-31 VITALS — BP 134/68 | HR 77 | Wt 154.0 lb

## 2015-01-31 DIAGNOSIS — I5022 Chronic systolic (congestive) heart failure: Secondary | ICD-10-CM | POA: Diagnosis not present

## 2015-01-31 DIAGNOSIS — D72829 Elevated white blood cell count, unspecified: Secondary | ICD-10-CM | POA: Diagnosis not present

## 2015-01-31 DIAGNOSIS — I447 Left bundle-branch block, unspecified: Secondary | ICD-10-CM | POA: Insufficient documentation

## 2015-01-31 DIAGNOSIS — E039 Hypothyroidism, unspecified: Secondary | ICD-10-CM | POA: Insufficient documentation

## 2015-01-31 DIAGNOSIS — N183 Chronic kidney disease, stage 3 (moderate): Secondary | ICD-10-CM | POA: Diagnosis not present

## 2015-01-31 DIAGNOSIS — M25552 Pain in left hip: Secondary | ICD-10-CM | POA: Insufficient documentation

## 2015-01-31 DIAGNOSIS — I2699 Other pulmonary embolism without acute cor pulmonale: Secondary | ICD-10-CM | POA: Diagnosis not present

## 2015-01-31 DIAGNOSIS — Z7901 Long term (current) use of anticoagulants: Secondary | ICD-10-CM | POA: Insufficient documentation

## 2015-01-31 DIAGNOSIS — Z950 Presence of cardiac pacemaker: Secondary | ICD-10-CM | POA: Diagnosis not present

## 2015-01-31 DIAGNOSIS — E7800 Pure hypercholesterolemia, unspecified: Secondary | ICD-10-CM | POA: Insufficient documentation

## 2015-01-31 DIAGNOSIS — I129 Hypertensive chronic kidney disease with stage 1 through stage 4 chronic kidney disease, or unspecified chronic kidney disease: Secondary | ICD-10-CM | POA: Diagnosis not present

## 2015-01-31 DIAGNOSIS — Z79899 Other long term (current) drug therapy: Secondary | ICD-10-CM | POA: Insufficient documentation

## 2015-01-31 LAB — CARBOXYHEMOGLOBIN
CARBOXYHEMOGLOBIN: 0.9 % (ref 0.5–1.5)
Methemoglobin: 0.8 % (ref 0.0–1.5)
O2 SAT: 54.4 %
Total hemoglobin: 12.2 g/dL (ref 12.0–16.0)

## 2015-01-31 NOTE — Telephone Encounter (Signed)
Will return for repeat co-ox 1/19 @ 11 Verified RN available this day for labs/nurse visit

## 2015-01-31 NOTE — Telephone Encounter (Signed)
Iris Pert with [facility] called to report patient missed PT apt due to MD apt.  [name]

## 2015-01-31 NOTE — Telephone Encounter (Signed)
Per Dr Haroldine Laws, pt's co-ox drawn today is a little lower than he would like, pt should continue milrinone for now and repeat the end of next week.  Attempted to call pt and Left message to call back

## 2015-01-31 NOTE — Telephone Encounter (Signed)
Patient returned call and is aware of results

## 2015-01-31 NOTE — Patient Instructions (Signed)
Lab drawn today, we will let you know results  Your physician recommends that you schedule a follow-up appointment in: 4-6 weeks

## 2015-01-31 NOTE — Progress Notes (Signed)
Patient ID: Alexandra [name], female   DOB: 07/11/1927, 82 y.o.   MRN: [id number]     Advanced Heart Failure Clinic Note   Primary Care: Dr. Melinda Crutch Primary Cardiologist: Dr Candee Furbish Primary HF: Dr Haroldine Laws  HPI: [name] is a 82 y.o. 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 Discharge weight was 196.  She was admitted in July 2016 for Abdominal pain and SOB. Her Gallbladder was thought to be causing her pain, so a lap choley was performed that admission. Echo on 08/15/14 showed EF 20%. There were thoughts that she may have stress induced CMP due to the death of her husband in May 08, 2022 after several months of hospice care.   She was directly admitted to [facility] on 10/11/14 with concerns for low output with SBPs in 80s and tachycardia.  PICC line was placed with initial co-ox of 61%.CVP was 15. Milrinone started when co-ox dropped to 53%. She had SVT on milrinone 0.25 so started on amio. Developed acute SOB that improved quickly with cessation of amio and extra dose of IV lasix. Felt to possibly have acute amio toxicity. Milrinone decreased to 0.125 and no further PSVT. Sent home on milrinone 0.125. Diuresed well on 80 mg lasix IB BID. Discharge weight was 170.  Admitted 12/1 through 12/6 with PICC line infection. Bcx 2/2 for  diptheroids. Bcx 1/2 at [facility]-Bacteremia-cornybacterium . PICC line replaced. Completed antibiotic course. She continued on milrinone 0.125 mcg. Started ivabradine.  Placement of CRT delayed.  S/p CRT-D placement 01/16/15 with Dr Caryl Comes.   Labs 01/29/15  with WBCs trending up 7.9 -> 10 -> 12. BCx ordered.  She was called and felt great s/p CRT-d placement. Denied fevers or chills. Had close follow up with [place].   She returns today for  regular follow up. Since last visit now s/p CRT-D placement. Weight down 5 lbs since last visit. Had wound check yesterday and everything looks good. Has felt a lot better since CRT-D placed. Had a pinched nerve just before her surgery and has had pain from that. Less SOB, but less activity with leg pain. No PND/ orthopnea.  Appetite ok. Taking all medications as directed. Denies fever or chills or drainage from PICC line.  Blood cultures drawn yesterday.   [facility] following for home milrinone,  HHPT, HHRN.  ECHO 10/02/14 LVEF 15% with diffuse hypokinesis, RV mildly dilated, normal function, PA peak pressure 59 mmHg   Past Medical History  Diagnosis Date  . Arthritis   . Headache(784.0)   . Anxiety   . Depression   . Asthma   . CAP (community acquired pneumonia) 12/19/2012  . Obesity   . Peripheral neuropathy ([place])   . Sacroiliitis, not elsewhere classified ([place])   . Osteopenia   . Hypercholesteremia   . Neuropathy ([place])   . Trigger finger     [name]  . Chest pain     NM stress test 05/2011 Normal  . DOE (dyspnea on exertion)     No SOB at rest  . Fatigue     excertional  . breast ca dx'd 2000    left  . Essential (primary) hypertension 04/02/2014  . Adult hypothyroidism 04/02/2014  . Hereditary and idiopathic peripheral neuropathy 07/02/2014  .  Leg weakness   . CKD (chronic kidney disease) stage 3, GFR 30-59 ml/min 08/13/2014  . Anemia of chronic disease 08/13/2014  . CHF (congestive heart failure) ([place]) 08/19/2014  . Situational depression   . Presence of permanent cardiac pacemaker 01/16/2015    BIV  . Intermittent LBBB (left bundle branch block) 12/21/2012    Current Outpatient Prescriptions  Medication Sig Dispense Refill  . acetaminophen (TYLENOL) 500 MG tablet Take 500 mg by mouth every 6 (six) hours as needed for mild pain or headache.     . albuterol (PROVENTIL HFA;VENTOLIN HFA) 108 (90 BASE) MCG/ACT inhaler Inhale 2 puffs into the lungs every 6 (six) hours as needed for  wheezing or shortness of breath.    . ALPRAZolam (XANAX) 0.25 MG tablet Take 0.25 mg by mouth 3 (three) times daily as needed for anxiety.    Marland Kitchen b complex vitamins tablet Take 1 tablet by mouth daily with lunch.     . calcium carbonate (TUMS - DOSED IN MG ELEMENTAL CALCIUM) 500 MG chewable tablet Chew 2 tablets by mouth daily as needed for indigestion or heartburn.    . Calcium Carbonate-Vitamin D (CALTRATE 600+D PO) Take 1 tablet by mouth daily with lunch.    . Calcium Polycarbophil (FIBERCON PO) Take 2.5-5 mLs by mouth daily as needed (constipation). Mix in 8 oz liquid and drink    . cholecalciferol (VITAMIN D) 1000 UNITS tablet Take 1,000 Units by mouth daily with lunch.     . citalopram (CELEXA) 20 MG tablet Take 1 tablet (20 mg total) by mouth daily. 30 tablet 6  . digoxin 62.5 MCG TABS Take 0.0625 mg by mouth daily. 30 tablet 6  . furosemide (LASIX) 40 MG tablet Take 1 tablet (40 mg total) by mouth daily. 30 tablet 6  . ivabradine (CORLANOR) 5 MG TABS tablet Take 1 tablet (5 mg total) by mouth 2 (two) times daily with a meal. 60 tablet 0  . lactose free nutrition (BOOST) LIQD Take 237 mLs by mouth daily with breakfast.     . levothyroxine (SYNTHROID, LEVOTHROID) 25 MCG tablet Take 25 mcg by mouth daily before breakfast. for thyroid    . Magnesium 200 MG TABS Take 1 tablet (200 mg total) by mouth daily. (Patient not taking: Reported on 01/30/2015) 30 each 3  . MAGNESIUM-OXIDE 400 (241.3 Mg) MG tablet Take 400 mg by mouth daily.  3  . milrinone (PRIMACOR) 20 MG/100ML SOLN infusion Inject 9.65 mcg/min into the vein continuous. (Patient not taking: Reported on 01/10/2015) 2100 mL 0  . Multiple Vitamin (MULITIVITAMIN WITH MINERALS) TABS Take 1 tablet by mouth daily with lunch.     . mupirocin ointment (BACTROBAN) 2 % Place 1 application into the nose 2 (two) times daily. Use for 5 days 22 g 0  . nitroGLYCERIN (NITROSTAT) 0.4 MG SL tablet Place 1 tablet (0.4 mg total) under the tongue every 5 (five)  minutes as needed for chest pain. 25 tablet 4  . OXYGEN Inhale 2 L into the lungs daily as needed (shortness of breath).     . pantoprazole (PROTONIX) 40 MG tablet Take 40 mg by mouth daily.     Vladimir Faster Glycol-Propyl Glycol (SYSTANE OP) Place 1 drop into both eyes 2 (two) times daily.    . potassium chloride (K-DUR) 10 MEQ tablet Take 1 tablet (10 mEq total) by mouth daily. 30 tablet 6  . predniSONE (STERAPRED UNI-PAK 48 TAB) 10 MG (48) TBPK tablet Take 1 tablet by mouth daily.  0  . sodium chloride 0.9 % SOLN with milrinone 1 MG/ML SOLN 200 mcg/mL Inject 0.125 mcg/kg/min into the vein continuous.    Marland Kitchen spironolactone (ALDACTONE) 25 MG tablet Take 1 tablet (25 mg total) by mouth daily. 30 tablet 6  . traMADol (ULTRAM) 50 MG tablet Take 1 tablet (50 mg total) by mouth every 6 (six) hours as needed for moderate pain. 60 tablet 3  . XARELTO 20 MG TABS tablet TAKE 1 TABLET BY MOUTH EVERY DAY 30 tablet 0   No current facility-administered medications for this encounter.    Allergies  Allergen Reactions  . Amiodarone Shortness Of Breath and Nausea And Vomiting  . Cymbalta [Duloxetine Hcl] Other (See Comments)    Depression  . Lyrica [Pregabalin] Other (See Comments)    Blurry vision      Social History   Social History  . Marital Status: Married    Spouse Name: N/A  . Number of Children: 5  . Years of Education: HS   Occupational History  . Retired    Social History Main Topics  . Smoking status: Never Smoker   . Smokeless tobacco: Never Used  . Alcohol Use: No  . Drug Use: No  . Sexual Activity: Not on file   Other Topics Concern  . Not on file   Social History Narrative   Lives at home alone.   Right-handed.   Two cups caffeine daily (coffee).      Family History  Problem Relation Age of Onset  . Diabetes Brother   . Alzheimer's disease Sister   . CVA Daughter     01/21/09  . Pancreatic cancer Father 13  . Pneumonia Mother 27    cause of death  . Heart attack  Neg Hx   . Stroke Daughter   . Hypertension Daughter     Danley Danker Vitals:   01/31/15 0952  BP: 134/68  Pulse: 77  Weight: 154 lb (69.854 kg)  SpO2: 97%   Wt Readings from Last 3 Encounters:  01/31/15 154 lb (69.854 kg)  01/17/15 169 lb 5 oz (76.8 kg)  01/08/15 159 lb 12.8 oz (72.485 kg)    PHYSICAL EXAM: General: In wheelchair, Daughter present.  HEENT: Normal, without mass or lesion. Neck: Supple, no bruits. JVP 6-7 cm. Carotids 2+. No thyromegaly or nodule. Heart: PMI nondisplaced, Regular rate no s4. +S3, no murmur. ICD placement site well healing.  Lungs: Slightly diminished R base Abdomen: Soft, NT, ND, no HSM +BS x 4.  Extremities: No clubbing, cyanosis. Trace ankle edema.  LUE PICC site clean, dry, and intact. Left thigh musculature decreased compared to right.  Neuro: Alert and oriented X 3. Moves all extremities spontaneously.  Psych: Affect pleaseant  ASSESSMENT & PLAN:  1. Chronic Systolic Heart failure - Echo 10/02/14 EF 15% NYHA IIIB. On chronic milrinone 0.125 mcg.  - Now s/p St Jude CRT-D placement. QRS from 150 -> 132 by EKG 12/21 - 12/30.  - Next physical check on 03/05/15. - Volume status stable on exam. - Check co-ox today.  If > 60% can stop milrinone and remove PICC.  - Blood cultures pending. PICC site looks great. - Continue digoxin 0.0625 mg daily . No bb with low output - Continue lasix 40 mg daily + 10 meq potassium daily .   - Continue corlanor 5 mg BID - Consider repeat echo in 4-5 months with med optimization and now with CRT-D 2. Recent PE/ DVT bilateral by [place] 08/20/2014 - Continue Xarelto 20  mg daily for now. (will be 6 months on 02/20/15) 3. CKD Stage 3  - Stable on recent labs via [facility].  4. Hx of LBBB 5. Hypertension  - Mildly elevated today, but in pain from leg.  Will follow.  - Could consider adding ACE/ARB/ARNI at next visit.  Held off previously with hypotension.  6. Intolerance of amiodarone due to possible acute lung toxicity 7.  H/O Bacteremia -  - cornybacterium 2/2 cultures 11/29 . Completed antibiotic course with vancomycin. PICC replaced . Repeat blood cultures obtained 01/06/15.  - WBCs trending up on recent labs. Blood culture results pending from [facility].   Follow up 4-6 weeks as long as BCx return negative. As above, if coox today > 60%, can send [facility] to d/c and remove PICC.   Shirley Friar PA-C   01/31/2015    Patient seen and examined with Oda Kilts, PA-C. We discussed all aspects of the encounter. I agree with the assessment and plan as stated above.   She is improved with CRT upgrade but co-ox drawn in clinic remains 54% so I think we need to continue milrinone for now. Volume status is ok. QRS duration has gone from 140 to ~120 ms so hopefully will continue to see an improvement with CRT. She is now limited by left hip pain and we have encouraged her to see Sports Medicine or Orthopedics for this. Her WBC has been climbing. She has no infectious symptoms currently and PICC site looks fine but we have drawn [place] and will continue to follow closely. Finally, we discussed duration of therapy for DVT. At next visit he will be about 6 months and we discussed stopping therapy versus continuing Xarelto or switching to low-dose apixaban for ongoing treatment per the Amplify-EXT trial. We will discuss further at next visit.  Liston Thum,MD 9:52 PM

## 2015-01-31 NOTE — Telephone Encounter (Signed)
I was called by [facility] RN.  1/2 blood cultures positive for Gram positive cocci in clusters.  [name] really does not want to come to the hospital.  I spoke with Dr. Haroldine Laws and we will resume vancomycin.  He will speak to Infectious Disease over the weekend.  I called [facility] to start the abx.     Alexandra [name], PAC

## 2015-02-01 DIAGNOSIS — J189 Pneumonia, unspecified organism: Secondary | ICD-10-CM | POA: Diagnosis not present

## 2015-02-01 DIAGNOSIS — I130 Hypertensive heart and chronic kidney disease with heart failure and stage 1 through stage 4 chronic kidney disease, or unspecified chronic kidney disease: Secondary | ICD-10-CM | POA: Diagnosis not present

## 2015-02-01 DIAGNOSIS — Z452 Encounter for adjustment and management of vascular access device: Secondary | ICD-10-CM | POA: Diagnosis not present

## 2015-02-01 DIAGNOSIS — Z48812 Encounter for surgical aftercare following surgery on the circulatory system: Secondary | ICD-10-CM | POA: Diagnosis not present

## 2015-02-01 DIAGNOSIS — I2699 Other pulmonary embolism without acute cor pulmonale: Secondary | ICD-10-CM | POA: Diagnosis not present

## 2015-02-01 DIAGNOSIS — F329 Major depressive disorder, single episode, unspecified: Secondary | ICD-10-CM | POA: Diagnosis not present

## 2015-02-01 DIAGNOSIS — I429 Cardiomyopathy, unspecified: Secondary | ICD-10-CM | POA: Diagnosis not present

## 2015-02-01 DIAGNOSIS — Z950 Presence of cardiac pacemaker: Secondary | ICD-10-CM | POA: Diagnosis not present

## 2015-02-01 DIAGNOSIS — E669 Obesity, unspecified: Secondary | ICD-10-CM | POA: Diagnosis not present

## 2015-02-01 DIAGNOSIS — N183 Chronic kidney disease, stage 3 (moderate): Secondary | ICD-10-CM | POA: Diagnosis not present

## 2015-02-01 DIAGNOSIS — I5043 Acute on chronic combined systolic (congestive) and diastolic (congestive) heart failure: Secondary | ICD-10-CM | POA: Diagnosis not present

## 2015-02-01 DIAGNOSIS — G609 Hereditary and idiopathic neuropathy, unspecified: Secondary | ICD-10-CM | POA: Diagnosis not present

## 2015-02-01 DIAGNOSIS — I5023 Acute on chronic systolic (congestive) heart failure: Secondary | ICD-10-CM | POA: Diagnosis not present

## 2015-02-03 ENCOUNTER — Telehealth: Payer: Self-pay | Admitting: Internal Medicine

## 2015-02-03 DIAGNOSIS — F329 Major depressive disorder, single episode, unspecified: Secondary | ICD-10-CM | POA: Diagnosis not present

## 2015-02-03 DIAGNOSIS — I130 Hypertensive heart and chronic kidney disease with heart failure and stage 1 through stage 4 chronic kidney disease, or unspecified chronic kidney disease: Secondary | ICD-10-CM | POA: Diagnosis not present

## 2015-02-03 DIAGNOSIS — Z48812 Encounter for surgical aftercare following surgery on the circulatory system: Secondary | ICD-10-CM | POA: Diagnosis not present

## 2015-02-03 DIAGNOSIS — I5023 Acute on chronic systolic (congestive) heart failure: Secondary | ICD-10-CM | POA: Diagnosis not present

## 2015-02-03 DIAGNOSIS — E669 Obesity, unspecified: Secondary | ICD-10-CM | POA: Diagnosis not present

## 2015-02-03 DIAGNOSIS — Z950 Presence of cardiac pacemaker: Secondary | ICD-10-CM | POA: Diagnosis not present

## 2015-02-03 DIAGNOSIS — G609 Hereditary and idiopathic neuropathy, unspecified: Secondary | ICD-10-CM | POA: Diagnosis not present

## 2015-02-03 DIAGNOSIS — N183 Chronic kidney disease, stage 3 (moderate): Secondary | ICD-10-CM | POA: Diagnosis not present

## 2015-02-03 DIAGNOSIS — Z452 Encounter for adjustment and management of vascular access device: Secondary | ICD-10-CM | POA: Diagnosis not present

## 2015-02-03 NOTE — Telephone Encounter (Signed)
Follow Up  Pt daughter Ms. [name] called states that the heart rate is contining to drop it is currently 63 or 64 today. Request a call back to discuss if this is normal.

## 2015-02-04 ENCOUNTER — Encounter: Payer: Self-pay | Admitting: Internal Medicine

## 2015-02-04 DIAGNOSIS — Z950 Presence of cardiac pacemaker: Secondary | ICD-10-CM | POA: Diagnosis not present

## 2015-02-04 DIAGNOSIS — I5043 Acute on chronic combined systolic (congestive) and diastolic (congestive) heart failure: Secondary | ICD-10-CM | POA: Diagnosis not present

## 2015-02-04 DIAGNOSIS — I429 Cardiomyopathy, unspecified: Secondary | ICD-10-CM | POA: Diagnosis not present

## 2015-02-04 DIAGNOSIS — Z452 Encounter for adjustment and management of vascular access device: Secondary | ICD-10-CM | POA: Diagnosis not present

## 2015-02-04 DIAGNOSIS — I5023 Acute on chronic systolic (congestive) heart failure: Secondary | ICD-10-CM | POA: Diagnosis not present

## 2015-02-04 DIAGNOSIS — F329 Major depressive disorder, single episode, unspecified: Secondary | ICD-10-CM | POA: Diagnosis not present

## 2015-02-04 DIAGNOSIS — Z48812 Encounter for surgical aftercare following surgery on the circulatory system: Secondary | ICD-10-CM | POA: Diagnosis not present

## 2015-02-04 DIAGNOSIS — J189 Pneumonia, unspecified organism: Secondary | ICD-10-CM | POA: Diagnosis not present

## 2015-02-04 DIAGNOSIS — E669 Obesity, unspecified: Secondary | ICD-10-CM | POA: Diagnosis not present

## 2015-02-04 DIAGNOSIS — G609 Hereditary and idiopathic neuropathy, unspecified: Secondary | ICD-10-CM | POA: Diagnosis not present

## 2015-02-04 DIAGNOSIS — N183 Chronic kidney disease, stage 3 (moderate): Secondary | ICD-10-CM | POA: Diagnosis not present

## 2015-02-04 DIAGNOSIS — I130 Hypertensive heart and chronic kidney disease with heart failure and stage 1 through stage 4 chronic kidney disease, or unspecified chronic kidney disease: Secondary | ICD-10-CM | POA: Diagnosis not present

## 2015-02-04 DIAGNOSIS — I2699 Other pulmonary embolism without acute cor pulmonale: Secondary | ICD-10-CM | POA: Diagnosis not present

## 2015-02-04 NOTE — Telephone Encounter (Signed)
Informed dtr of normal HR ranges.  Explained that device lower setting is at 60 and what this means.  She verbalized understanding.

## 2015-02-05 ENCOUNTER — Ambulatory Visit (HOSPITAL_COMMUNITY)
Admission: RE | Admit: 2015-02-05 | Discharge: 2015-02-05 | Disposition: A | Discharge status: Home / Self Care | Payer: Commercial Managed Care - HMO | Source: Ambulatory Visit | Attending: Cardiology | Admitting: Cardiology

## 2015-02-05 ENCOUNTER — Other Ambulatory Visit: Payer: Self-pay | Admitting: *Deleted

## 2015-02-05 DIAGNOSIS — Z452 Encounter for adjustment and management of vascular access device: Secondary | ICD-10-CM | POA: Diagnosis not present

## 2015-02-05 DIAGNOSIS — I5022 Chronic systolic (congestive) heart failure: Secondary | ICD-10-CM | POA: Insufficient documentation

## 2015-02-05 DIAGNOSIS — N183 Chronic kidney disease, stage 3 (moderate): Secondary | ICD-10-CM | POA: Diagnosis not present

## 2015-02-05 DIAGNOSIS — E669 Obesity, unspecified: Secondary | ICD-10-CM | POA: Diagnosis not present

## 2015-02-05 DIAGNOSIS — G609 Hereditary and idiopathic neuropathy, unspecified: Secondary | ICD-10-CM | POA: Diagnosis not present

## 2015-02-05 DIAGNOSIS — Z48812 Encounter for surgical aftercare following surgery on the circulatory system: Secondary | ICD-10-CM | POA: Diagnosis not present

## 2015-02-05 DIAGNOSIS — F329 Major depressive disorder, single episode, unspecified: Secondary | ICD-10-CM | POA: Diagnosis not present

## 2015-02-05 DIAGNOSIS — I5023 Acute on chronic systolic (congestive) heart failure: Secondary | ICD-10-CM

## 2015-02-05 DIAGNOSIS — Z950 Presence of cardiac pacemaker: Secondary | ICD-10-CM | POA: Diagnosis not present

## 2015-02-05 DIAGNOSIS — I130 Hypertensive heart and chronic kidney disease with heart failure and stage 1 through stage 4 chronic kidney disease, or unspecified chronic kidney disease: Secondary | ICD-10-CM | POA: Diagnosis not present

## 2015-02-05 LAB — CARBOXYHEMOGLOBIN
CARBOXYHEMOGLOBIN: 0.9 % (ref 0.5–1.5)
METHEMOGLOBIN: 0.9 % (ref 0.0–1.5)
O2 Saturation: 50.1 %
Total hemoglobin: 13.3 g/dL (ref 12.0–16.0)

## 2015-02-05 NOTE — Patient Outreach (Signed)
[facility] has been trying to engage this pt and family for care management services for several months. I was able to meet with Alexandra [name] and [name] today. I provided our introductory patient folder and described our services. Pt and daughter will consider. They already have [facility] that provides weekly nursing visits to change her PICC line and change her milirone medication bag.   I will call [name] in 10 days to see if they have decided to participate.  [name] [facility] [phone]

## 2015-02-06 ENCOUNTER — Encounter (HOSPITAL_COMMUNITY): Payer: Commercial Managed Care - HMO

## 2015-02-06 ENCOUNTER — Other Ambulatory Visit: Payer: Self-pay | Admitting: Adult Health

## 2015-02-06 DIAGNOSIS — Z452 Encounter for adjustment and management of vascular access device: Secondary | ICD-10-CM | POA: Diagnosis not present

## 2015-02-06 DIAGNOSIS — Z48812 Encounter for surgical aftercare following surgery on the circulatory system: Secondary | ICD-10-CM | POA: Diagnosis not present

## 2015-02-06 DIAGNOSIS — E669 Obesity, unspecified: Secondary | ICD-10-CM | POA: Diagnosis not present

## 2015-02-06 DIAGNOSIS — I130 Hypertensive heart and chronic kidney disease with heart failure and stage 1 through stage 4 chronic kidney disease, or unspecified chronic kidney disease: Secondary | ICD-10-CM | POA: Diagnosis not present

## 2015-02-06 DIAGNOSIS — F329 Major depressive disorder, single episode, unspecified: Secondary | ICD-10-CM | POA: Diagnosis not present

## 2015-02-06 DIAGNOSIS — I5023 Acute on chronic systolic (congestive) heart failure: Secondary | ICD-10-CM | POA: Diagnosis not present

## 2015-02-06 DIAGNOSIS — N183 Chronic kidney disease, stage 3 (moderate): Secondary | ICD-10-CM | POA: Diagnosis not present

## 2015-02-06 DIAGNOSIS — G609 Hereditary and idiopathic neuropathy, unspecified: Secondary | ICD-10-CM | POA: Diagnosis not present

## 2015-02-06 DIAGNOSIS — Z950 Presence of cardiac pacemaker: Secondary | ICD-10-CM | POA: Diagnosis not present

## 2015-02-07 ENCOUNTER — Telehealth (HOSPITAL_COMMUNITY): Payer: Self-pay | Admitting: *Deleted

## 2015-02-07 ENCOUNTER — Ambulatory Visit (INDEPENDENT_AMBULATORY_CARE_PROVIDER_SITE_OTHER): Payer: Commercial Managed Care - HMO | Admitting: Family Medicine

## 2015-02-07 ENCOUNTER — Other Ambulatory Visit (HOSPITAL_COMMUNITY): Payer: Self-pay | Admitting: Internal Medicine

## 2015-02-07 ENCOUNTER — Encounter: Payer: Self-pay | Admitting: Family Medicine

## 2015-02-07 VITALS — BP 122/53 | Ht 64.5 in | Wt 151.0 lb

## 2015-02-07 DIAGNOSIS — Z950 Presence of cardiac pacemaker: Secondary | ICD-10-CM | POA: Diagnosis not present

## 2015-02-07 DIAGNOSIS — Z452 Encounter for adjustment and management of vascular access device: Secondary | ICD-10-CM | POA: Diagnosis not present

## 2015-02-07 DIAGNOSIS — M79662 Pain in left lower leg: Secondary | ICD-10-CM | POA: Diagnosis not present

## 2015-02-07 DIAGNOSIS — M79604 Pain in right leg: Secondary | ICD-10-CM | POA: Insufficient documentation

## 2015-02-07 DIAGNOSIS — F329 Major depressive disorder, single episode, unspecified: Secondary | ICD-10-CM | POA: Diagnosis not present

## 2015-02-07 DIAGNOSIS — Z48812 Encounter for surgical aftercare following surgery on the circulatory system: Secondary | ICD-10-CM | POA: Diagnosis not present

## 2015-02-07 DIAGNOSIS — N183 Chronic kidney disease, stage 3 (moderate): Secondary | ICD-10-CM | POA: Diagnosis not present

## 2015-02-07 DIAGNOSIS — I5023 Acute on chronic systolic (congestive) heart failure: Secondary | ICD-10-CM | POA: Diagnosis not present

## 2015-02-07 DIAGNOSIS — G609 Hereditary and idiopathic neuropathy, unspecified: Secondary | ICD-10-CM | POA: Diagnosis not present

## 2015-02-07 DIAGNOSIS — E669 Obesity, unspecified: Secondary | ICD-10-CM | POA: Diagnosis not present

## 2015-02-07 DIAGNOSIS — I130 Hypertensive heart and chronic kidney disease with heart failure and stage 1 through stage 4 chronic kidney disease, or unspecified chronic kidney disease: Secondary | ICD-10-CM | POA: Diagnosis not present

## 2015-02-07 NOTE — Progress Notes (Signed)
Patient ID: Alexandra [name], female   DOB: Mar 28, 1927, 82 y.o.   MRN: [id number]  Alexandra [name] - 82 y.o. female MRN [id number]  Date of birth: [birth year]    SUBJECTIVE:      Left lower leg pain. Has had multiple health issues recently been in the hospital quite a bit. On last discharge she came out with significant weakness and has been in a rehabilitation facility , mostly using a wheelchair. She is doing PT there. Her complaint is of left shin pain with weightbearing. She's had no specific injury that she's aware of. She does not have pain at rest. She has no foot pain , no ankle pain. ROS:      she's noted no rash or erythema of the left lower  Leg , no ankle swelling. She has not had fever, sweats, chills.  PERTINENT  PMH / PSH FH / / SH:  Past Medical, Surgical, Social, and Family History Reviewed & Updated in the EMR.  Pertinent findings include:   CHF  history of breast cancer  Recent placement of a pacemaker and is currently on milrinone  was recently treated outpatient with what sounds like a steroid taper for this left lower leg pain. She says it was improved while she was on the steroids but  Symptoms returned the minute she stopped them.  currently has a PICC line  history of idiopathic peripheral neuropathy   CKG stage III  OBJECTIVE: BP 122/53 mmHg  Ht 5' 4.5" (1.638 m)  Wt 151 lb (68.493 kg)  BMI 25.53 kg/m2  Physical Exam:  Vital signs are reviewed.  well-developed, frail-appearing female sitting in a wheelchair. She is accompanied by her son and daughter-in-law.    EXTREMITY: Bilaterally lower extremities reveal no edema. The left shin is mildly tender to deep palpation in the distal one third area. I see no defect , no swelling, no erythema, no lesions.  ANKLE: Left and right ankle have normal range of motion. Dorsiflexion and plantarflexion is 2+ bilaterally symmetrical  CALVES : Bilaterally soft.  NEURO: Intact sensation to soft touch bilateral lower leg ankle and  foot.  VASCULAR: normal Refill bilateral lower leg ankle and foot. Skin is warm with normal color.   ULTRASOUND: ultrasound of the tibia left lower leg revealed no sign of stress fracture. There is no cortical irregularities. There is no area of increased Doppler flow. Specific attention was pain to the distal third where she has some tenderness.  ASSESSMENT & PLAN: Greater than 50% of our 30 minute office visit was spent in counseling and education regarding my clinical exam and ultrasound findings, differential diagnoses excluded and likely improvement with continued PT..  See problem based charting & AVS for pt instructions.

## 2015-02-07 NOTE — Telephone Encounter (Signed)
[facility] RN called to get results for blood work that Alexandra [name] had on Wednesday. Told RN DB hasn't reviewed labs yet wasn't in office today. That the labs will be reviewed and we will contact them next week.  She was inquiring about her PICC being pulled also.

## 2015-02-07 NOTE — Assessment & Plan Note (Addendum)
I could find no abnormality on clinical or ultrasound examination other than the mild tenderness to palpation right over the tibia, distal 1/3.Marland Kitchen I see no sign of stress fracture and no increased Doppler activity that would indicate bone bruise etc. Suspect she's quite deconditioned after all of her recent health issues. I would encourage her to do some continued standing and walking with gradual increase in time spent weightbearing and with physical therapy.   Given her remote history of breast cancer, there is a very low the possibility that she has some type of bone lesion that is not apparent on ultrasound ---but I think that is very low possibility.  Should she have worsening pain , I would recommend plain x-rays of tib-fib on the left.  She also as history of some idiopathic peripheral neuropathy that might be contributing to this. I don't see any sign of lumbar radiculopathy. We offered her a compression sleeve to see if that would improve her pain and she did not feel it was helpful. She can follow-up when necessary. Certainly call with any worsening or new symptoms.

## 2015-02-10 ENCOUNTER — Telehealth: Payer: Self-pay | Admitting: Internal Medicine

## 2015-02-10 ENCOUNTER — Telehealth (HOSPITAL_COMMUNITY): Payer: Self-pay | Admitting: Cardiology

## 2015-02-10 ENCOUNTER — Ambulatory Visit: Payer: Commercial Managed Care - HMO

## 2015-02-10 ENCOUNTER — Telehealth: Payer: Self-pay | Admitting: *Deleted

## 2015-02-10 ENCOUNTER — Other Ambulatory Visit (HOSPITAL_COMMUNITY): Payer: Self-pay | Admitting: Internal Medicine

## 2015-02-10 DIAGNOSIS — Z48812 Encounter for surgical aftercare following surgery on the circulatory system: Secondary | ICD-10-CM | POA: Diagnosis not present

## 2015-02-10 DIAGNOSIS — I130 Hypertensive heart and chronic kidney disease with heart failure and stage 1 through stage 4 chronic kidney disease, or unspecified chronic kidney disease: Secondary | ICD-10-CM | POA: Diagnosis not present

## 2015-02-10 DIAGNOSIS — G609 Hereditary and idiopathic neuropathy, unspecified: Secondary | ICD-10-CM | POA: Diagnosis not present

## 2015-02-10 DIAGNOSIS — N183 Chronic kidney disease, stage 3 (moderate): Secondary | ICD-10-CM | POA: Diagnosis not present

## 2015-02-10 DIAGNOSIS — Z452 Encounter for adjustment and management of vascular access device: Secondary | ICD-10-CM | POA: Diagnosis not present

## 2015-02-10 DIAGNOSIS — I5023 Acute on chronic systolic (congestive) heart failure: Secondary | ICD-10-CM | POA: Diagnosis not present

## 2015-02-10 DIAGNOSIS — R7881 Bacteremia: Secondary | ICD-10-CM

## 2015-02-10 DIAGNOSIS — Z950 Presence of cardiac pacemaker: Secondary | ICD-10-CM | POA: Diagnosis not present

## 2015-02-10 DIAGNOSIS — E669 Obesity, unspecified: Secondary | ICD-10-CM | POA: Diagnosis not present

## 2015-02-10 DIAGNOSIS — F329 Major depressive disorder, single episode, unspecified: Secondary | ICD-10-CM | POA: Diagnosis not present

## 2015-02-10 NOTE — Telephone Encounter (Signed)
daughter wants to talk to doctor about her moms visit on friday   By Cyd Silence, CMA   Daughter [phone] [name] discussion---reviewed office visit with her Mom on Friday. Alexandra [name]'s brother and sister in law were at [facility] She is getting second hand info per her. Items: 1.  Alexandra [name] is still having left lower  eg pain (left shin). Her daughter mentioned that sometimes it also radiates up her leg--I did not get that history at ov; however, it does not seem consistent with pinched nerve as their is a large are of 'skip" of symptoms. I explained that. 2. I did [place] which showed no sign of stress fracture--we could do further workup with CT of leg (no MRI because of pacemaker presence). They do not really want to pursue that but will consider it. CT would rule out any metastatic lesion from her remote hx breast cancer--that is low on differential but possible---could also let us see integrity of bone etc. 3. Her Mom had pain so much last night she had difficulty sleeping---eventually she took one tramadol. Unclear how much that helped her. We discussed pain medicine management--getting ahead of pain with appropriate dosing. Of the pain meds she has I would recommend tramadol rather than oxycodone. They agreed to try some more regular dosing of tramadol and get back with me in a few days 4. She also asked about further workup for idiopathic peripheral neuropathy which we had discussed at her first ov over a year ago. I doubt that would have a great impact on treatment regimen and would be big workup (PNCV/ EMG) as well as a lot of blood work. She did not want to pursue that and I actually agree with that wholeheartedly given  her recent major health issues. 5. I am not sure why she is having L lower leg pain--I would be happy to refer to anyone they chose to see and I told her that. She is continuing in PT---was ordered prior to my ov--I suspect it is primarily for conditioning---I would not stop that.  I still think her L LE pain may be related in part to deconditioning so I do not think that will hurt--I explained that I am not saying that exercise will 'cure" her leg pain however.

## 2015-02-10 NOTE — Telephone Encounter (Signed)
Returned patient's daughter's Alexandra [name]) call.  She is concerned because patient's pacemaker "isn't helping".  She states "it's been weeks and she doesn't feel any better and they can't get her off the milrinone".  Alexandra [name] is aware of patient's upcoming appointments with Chanetta Marshall, NP on 03/05/15, Dr. Haroldine Laws on 03/13/15, and with Dr. Caryl Comes on 04/18/15, but she is concerned that these appointments "don't seem soon enough".  Alexandra [name] is also concerned because her mother's voice has become progressively more weak and it is not attributable to a cold or allergies.  She is concerned this is related to the patient's ongoing CHF symptoms.  Martie Round that I would relay this message to Dr. Caryl Comes and either myself or Dr. Olin Pia nurse would call her back with recommendations when he is back in the office later this week.  Alexandra [name] is agreeable to this plan and denies additional questions at this time.

## 2015-02-10 NOTE — Telephone Encounter (Signed)
Patients daughter called to  Review lab results (co-ox) and she wanted to know status of vancomycin treatment  Reviewed co-ox results from 02/05/15 and Dr.Bensimhons' reply to continue milrinone  Daughter then asked when will repeat labs be done for this Unsure how long Dr.Bensimhon would like to wait until this is rechecked, will follow up with provider and call back.  Daughter also questioned if she should restart vanc? If antibiotics therapy was enough this time? Again would have to speak to provider and call family back with answers

## 2015-02-10 NOTE — Telephone Encounter (Signed)
New Message  Pt dtr calling to speak w/ RN- stating that device is not working- Please call back and discuss.

## 2015-02-11 DIAGNOSIS — N183 Chronic kidney disease, stage 3 (moderate): Secondary | ICD-10-CM | POA: Diagnosis not present

## 2015-02-11 DIAGNOSIS — E669 Obesity, unspecified: Secondary | ICD-10-CM | POA: Diagnosis not present

## 2015-02-11 DIAGNOSIS — I5023 Acute on chronic systolic (congestive) heart failure: Secondary | ICD-10-CM | POA: Diagnosis not present

## 2015-02-11 DIAGNOSIS — I2699 Other pulmonary embolism without acute cor pulmonale: Secondary | ICD-10-CM | POA: Diagnosis not present

## 2015-02-11 DIAGNOSIS — Z950 Presence of cardiac pacemaker: Secondary | ICD-10-CM | POA: Diagnosis not present

## 2015-02-11 DIAGNOSIS — I5043 Acute on chronic combined systolic (congestive) and diastolic (congestive) heart failure: Secondary | ICD-10-CM | POA: Diagnosis not present

## 2015-02-11 DIAGNOSIS — Z48812 Encounter for surgical aftercare following surgery on the circulatory system: Secondary | ICD-10-CM | POA: Diagnosis not present

## 2015-02-11 DIAGNOSIS — F329 Major depressive disorder, single episode, unspecified: Secondary | ICD-10-CM | POA: Diagnosis not present

## 2015-02-11 DIAGNOSIS — I429 Cardiomyopathy, unspecified: Secondary | ICD-10-CM | POA: Diagnosis not present

## 2015-02-11 DIAGNOSIS — I130 Hypertensive heart and chronic kidney disease with heart failure and stage 1 through stage 4 chronic kidney disease, or unspecified chronic kidney disease: Secondary | ICD-10-CM | POA: Diagnosis not present

## 2015-02-11 DIAGNOSIS — J189 Pneumonia, unspecified organism: Secondary | ICD-10-CM | POA: Diagnosis not present

## 2015-02-11 DIAGNOSIS — Z452 Encounter for adjustment and management of vascular access device: Secondary | ICD-10-CM | POA: Diagnosis not present

## 2015-02-11 DIAGNOSIS — G609 Hereditary and idiopathic neuropathy, unspecified: Secondary | ICD-10-CM | POA: Diagnosis not present

## 2015-02-11 NOTE — Telephone Encounter (Signed)
Reviewed with Dr. Caryl ComesRaquel Sarna will call the patient's daughter back.

## 2015-02-11 NOTE — Telephone Encounter (Signed)
Dr. Caryl Comes noted that it may be beneficial to make programming changes to LV offset based on EKG.  Called Alexandra [name] back to make her aware that there may be additional device programming that we can do and to offer an appointment with Dr. Caryl Comes on either 02/14/15 at 12:30pm or 02/28/15 at 12:30pm.  Alexandra [name] states she would like to discuss with her brother and her mother and she will call back tomorrow with their choice of appointment.  Gave device clinic phone number.

## 2015-02-11 NOTE — Telephone Encounter (Signed)
Called Patti to let her know that Dr. Caryl Comes recommended keeping scheduled follow-up appointments in February with Chanetta Marshall, NP and Dr. Haroldine Laws to follow-up with patient's progress since BiV PPM was implanted.  She is aware to call our office with questions regarding the PPM and to call Dr. Clayborne Dana office with questions.  She denies additional questions at this time.

## 2015-02-12 ENCOUNTER — Other Ambulatory Visit: Payer: Self-pay | Admitting: *Deleted

## 2015-02-12 ENCOUNTER — Other Ambulatory Visit (HOSPITAL_COMMUNITY): Payer: Self-pay | Admitting: *Deleted

## 2015-02-12 NOTE — Patient Outreach (Signed)
Alexandra [name] called me today to advise me that they have decided they have a good support team in place and have decided not to participate at this time in our [employer] program. I have told her that if they change their mind they have my number and they just need to let me know.  [name] [facility] [place] [phone]

## 2015-02-12 NOTE — Telephone Encounter (Signed)
Reviewed labs with patient that were drawn 02/11/15 with [facility] Pt reports she feels fine, denies sob and swelling denied cough   Per Dr. Heath Lark, PA Pt will need consult with ID regarding vanc treatment  Patient aware and would like to speak to daughter regarding referral, partial to cost- may return call regarding cancelling referral Referral placed

## 2015-02-12 NOTE — Telephone Encounter (Signed)
Patti called back to schedule appointment 02/14/15 at 12:30pm.  Scheduler aware and will add to Dr. Olin Pia schedule.

## 2015-02-13 ENCOUNTER — Ambulatory Visit: Payer: Self-pay | Admitting: *Deleted

## 2015-02-13 ENCOUNTER — Encounter: Payer: Self-pay | Admitting: *Deleted

## 2015-02-13 DIAGNOSIS — Z950 Presence of cardiac pacemaker: Secondary | ICD-10-CM | POA: Diagnosis not present

## 2015-02-13 DIAGNOSIS — G609 Hereditary and idiopathic neuropathy, unspecified: Secondary | ICD-10-CM | POA: Diagnosis not present

## 2015-02-13 DIAGNOSIS — Z48812 Encounter for surgical aftercare following surgery on the circulatory system: Secondary | ICD-10-CM | POA: Diagnosis not present

## 2015-02-13 DIAGNOSIS — F329 Major depressive disorder, single episode, unspecified: Secondary | ICD-10-CM | POA: Diagnosis not present

## 2015-02-13 DIAGNOSIS — E669 Obesity, unspecified: Secondary | ICD-10-CM | POA: Diagnosis not present

## 2015-02-13 DIAGNOSIS — I130 Hypertensive heart and chronic kidney disease with heart failure and stage 1 through stage 4 chronic kidney disease, or unspecified chronic kidney disease: Secondary | ICD-10-CM | POA: Diagnosis not present

## 2015-02-13 DIAGNOSIS — Z452 Encounter for adjustment and management of vascular access device: Secondary | ICD-10-CM | POA: Diagnosis not present

## 2015-02-13 DIAGNOSIS — I5023 Acute on chronic systolic (congestive) heart failure: Secondary | ICD-10-CM | POA: Diagnosis not present

## 2015-02-13 DIAGNOSIS — N183 Chronic kidney disease, stage 3 (moderate): Secondary | ICD-10-CM | POA: Diagnosis not present

## 2015-02-14 ENCOUNTER — Other Ambulatory Visit (HOSPITAL_COMMUNITY): Payer: Self-pay | Admitting: Internal Medicine

## 2015-02-14 ENCOUNTER — Encounter: Payer: Self-pay | Admitting: Cardiovascular Disease

## 2015-02-14 ENCOUNTER — Encounter: Payer: Self-pay | Admitting: Internal Medicine

## 2015-02-14 ENCOUNTER — Telehealth: Payer: Self-pay | Admitting: *Deleted

## 2015-02-14 ENCOUNTER — Ambulatory Visit (INDEPENDENT_AMBULATORY_CARE_PROVIDER_SITE_OTHER): Payer: Commercial Managed Care - HMO | Admitting: Internal Medicine

## 2015-02-14 ENCOUNTER — Telehealth (HOSPITAL_COMMUNITY): Payer: Self-pay | Admitting: Cardiology

## 2015-02-14 VITALS — BP 108/50 | HR 76 | Ht 64.5 in | Wt 157.6 lb

## 2015-02-14 DIAGNOSIS — E669 Obesity, unspecified: Secondary | ICD-10-CM | POA: Diagnosis not present

## 2015-02-14 DIAGNOSIS — I5023 Acute on chronic systolic (congestive) heart failure: Secondary | ICD-10-CM | POA: Diagnosis not present

## 2015-02-14 DIAGNOSIS — I5022 Chronic systolic (congestive) heart failure: Secondary | ICD-10-CM

## 2015-02-14 DIAGNOSIS — N183 Chronic kidney disease, stage 3 (moderate): Secondary | ICD-10-CM | POA: Diagnosis not present

## 2015-02-14 DIAGNOSIS — G609 Hereditary and idiopathic neuropathy, unspecified: Secondary | ICD-10-CM | POA: Diagnosis not present

## 2015-02-14 DIAGNOSIS — F329 Major depressive disorder, single episode, unspecified: Secondary | ICD-10-CM | POA: Diagnosis not present

## 2015-02-14 DIAGNOSIS — Z48812 Encounter for surgical aftercare following surgery on the circulatory system: Secondary | ICD-10-CM | POA: Diagnosis not present

## 2015-02-14 DIAGNOSIS — I429 Cardiomyopathy, unspecified: Secondary | ICD-10-CM | POA: Diagnosis not present

## 2015-02-14 DIAGNOSIS — Z452 Encounter for adjustment and management of vascular access device: Secondary | ICD-10-CM | POA: Diagnosis not present

## 2015-02-14 DIAGNOSIS — R7881 Bacteremia: Secondary | ICD-10-CM

## 2015-02-14 DIAGNOSIS — I130 Hypertensive heart and chronic kidney disease with heart failure and stage 1 through stage 4 chronic kidney disease, or unspecified chronic kidney disease: Secondary | ICD-10-CM | POA: Diagnosis not present

## 2015-02-14 DIAGNOSIS — Z950 Presence of cardiac pacemaker: Secondary | ICD-10-CM

## 2015-02-14 DIAGNOSIS — I428 Other cardiomyopathies: Secondary | ICD-10-CM

## 2015-02-14 MED ORDER — DIGOXIN 125 MCG PO TABS: 0.0625 mg | ORAL_TABLET | Freq: Every day | ORAL | Status: DC

## 2015-02-14 NOTE — Patient Instructions (Signed)
Medication Instructions: - no changes  Labwork: - none  Procedures/Testing: - Your physician has recommended that you have an AV optimization echo ( [employer] ) - can we please do this on 2/15 when she is already scheduled to see Amber.. During this procedure, an echocardiogram is performed to optimize the timing of your device using ultrasound and a device programmer. Changes will be made to the device settings to help the heart chambers pump more efficiently. This procedure takes approximately one hour.  Follow-Up: - Your physician recommends that you keep your  follow-up appointment on :  Wednesday 03/05/15 at 10:00 am with Alexandra Marshall, NP for Dr. Caryl Comes.   Any Additional Special Instructions Will Be Listed Below (If Applicable).     If you need a refill on your cardiac medications before your next appointment, please call your pharmacy.

## 2015-02-14 NOTE — Telephone Encounter (Signed)
Daughter calling about stating she talked to someone with the help from [name] in the [facility] about her mother's rx for Corlanor and Lanoxin co-pay pricing. She states no one has got back with her to let her know if there has been a change for a more cost effective medication.  I let her know I would send this message to the [facility] Clinic for [name] and Heather to see. She thought she was calling [facility] because the [facility] [place] the number they have listed for Dr Haroldine Laws. I gave her the correct number to CHF and let her know I will send this message over. She prefers to talk to someone instead of me sending the message to call her back.

## 2015-02-14 NOTE — Telephone Encounter (Signed)
Verbal order given to continue home PT

## 2015-02-14 NOTE — Progress Notes (Signed)
ELECTROPHYSIOLOGY office  NOTE  Patient ID: Alexandra [name], MRN: [id number], DOB/AGE: 1927-02-21 82 y.o. Admit date: (Not on file) Date of Consult: 02/14/2015  Primary Physician:  Melinda Crutch, MD Primary Cardiologist: DB  Chief Complaint: CRT-P   HPI Alexandra [name] is a 82 y.o. female  Seen folowing  CRT  implant  She has a history of nonischemic cardiomyopathy and left bundle branch block. It was initially thought to be related to [place] when she presented 7/16 EF at that time was 20%. She was admitted 9/16 with low output and was treated with milrinone down titration of which was required because of tachycardia. 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  Milrinone has been continued at very low doses. She remains functionally very impaired able to walk less than 50 feet. She is able to sleep mostly flat. She has scant edema.   She has not felt better post implant  ECG had shown neg QRS 1 but also neg in V1   post implant COOX  have not changed appreciably       Past Medical History  Diagnosis Date  . Arthritis   . Headache(784.0)   . Anxiety   . Depression   . Asthma   . CAP (community acquired pneumonia) 12/19/2012  . Obesity   . Peripheral neuropathy ([place])   . Sacroiliitis, not elsewhere classified ([place])   . Osteopenia   . Hypercholesteremia   . Neuropathy ([place])   . Trigger finger     [name]  . Chest pain     NM stress test 05/2011 Normal  . DOE (dyspnea on exertion)     No SOB at rest  . Fatigue     excertional  . breast ca dx'd 2000    left  . Essential (primary) hypertension 04/02/2014  . Adult hypothyroidism 04/02/2014  . Hereditary and idiopathic peripheral neuropathy 07/02/2014  . Leg weakness   . CKD (chronic kidney disease) stage 3, GFR 30-59 ml/min 08/13/2014  . Anemia of chronic disease 08/13/2014  . CHF (congestive heart failure)  ([place]) 08/19/2014  . Situational depression   . Presence of permanent cardiac pacemaker 01/16/2015    BIV  . Intermittent LBBB (left bundle branch block) 12/21/2012      Surgical History:  Past Surgical History  Procedure Laterality Date  . Leg surgery    . Breast lumpectomy      breast cancer  . Lumbar laminectomy    . Achilles tendon surgery    . Tonsillectomy    . Vesicovaginal fistula closure w/ tah      DC hysterectomy  . Polypectomy    . Cholecystectomy N/A 08/13/2014    Procedure: LAPAROSCOPIC CHOLECYSTECTOMY;  Surgeon: Stark Klein, MD;  Location: [facility];  Service: General;  Laterality: N/A;  . Ep implantable device N/A 01/16/2015    Procedure: BiV Pacemaker Insertion CRT-P;  Surgeon: Deboraha Sprang, MD;  Location: [facility];  Service: Cardiovascular;  Laterality: N/A;     Home Meds: Prior to Admission medications   Medication Sig Start Date End Date Taking? Authorizing Provider  acetaminophen (TYLENOL) 500 MG tablet Take 500 mg by mouth every 6 (six) hours as needed for mild pain.   Yes Historical Provider, MD  ALPRAZolam (XANAX) 0.25 MG tablet Take 0.25 mg by mouth 3 (three) times daily as needed for anxiety.   Yes Historical Provider, MD  b complex vitamins tablet Take 1 tablet by mouth every evening.    Yes Historical Provider, MD  Calcium Carbonate (CALTRATE 600 PO) Take 600 mg by mouth 2 (two) times daily.   Yes Historical Provider, MD  calcium carbonate (TUMS - DOSED IN MG ELEMENTAL CALCIUM) 500 MG chewable tablet Chew 2 tablets by mouth daily as needed for indigestion or heartburn.   Yes Historical Provider, MD  cholecalciferol (VITAMIN D) 1000 UNITS tablet Take 1,000 Units by mouth daily.   Yes Historical Provider, MD  citalopram (CELEXA) 20 MG tablet Take 1 tablet (20 mg total) by mouth daily. 11/27/14  Yes Jolaine Artist, MD  digoxin 62.5 MCG TABS Take 0.0625 mg by mouth daily. 10/15/14  Yes Shirley Friar, PA-C  furosemide (LASIX) 40 MG tablet  Take 1 tablet (40 mg total) by mouth daily. 10/15/14  Yes Shirley Friar, PA-C  lactose free nutrition (BOOST) LIQD Take 237 mLs by mouth daily with breakfast.    Yes Historical Provider, MD  levothyroxine (SYNTHROID, LEVOTHROID) 25 MCG tablet Take one tablet by mouth once daily in the morning before breakfast for thyroid   Yes Historical Provider, MD  milrinone (PRIMACOR) 20 MG/100ML SOLN infusion Inject 9.875 mcg/min into the vein continuous. 10/15/14  Yes Shirley Friar, PA-C  Multiple Vitamin (MULITIVITAMIN WITH MINERALS) TABS Take 1 tablet by mouth daily.   Yes Historical Provider, MD  nitroGLYCERIN (NITROSTAT) 0.4 MG SL tablet Place 1 tablet (0.4 mg total) under the tongue every 5 (five) minutes as needed for chest pain. 11/22/14  Yes Jolaine Artist, MD  OXYGEN Inhale 2 L into the lungs daily.   Yes Historical Provider, MD  pantoprazole (PROTONIX) 40 MG tablet Take 40 mg by mouth 2 (two) times daily.   Yes Historical Provider, MD  polyethylene glycol (MIRALAX / GLYCOLAX) packet Take 17 g by mouth daily as needed for mild constipation.   Yes Historical Provider, MD  potassium chloride (K-DUR) 10 MEQ tablet Take 1 tablet (10 mEq total) by mouth daily. 10/15/14  Yes Shirley Friar, PA-C  promethazine (PHENERGAN) 25 MG tablet Take one tablet by mouth every 8 hours as needed for nausea   Yes Historical Provider, MD  sodium chloride (OCEAN) 0.65 % SOLN nasal spray Place 1 spray into both nostrils as needed for congestion.   Yes Historical Provider, MD  spironolactone (ALDACTONE) 25 MG tablet Take 1 tablet (25 mg total) by mouth daily. 10/15/14  Yes Shirley Friar, PA-C  traMADol (ULTRAM) 50 MG tablet Take 1 tablet (50 mg total) by mouth every 6 (six) hours as needed for moderate pain. 08/23/14  Yes Charlynne Cousins, MD  VENTOLIN HFA 108 (90 BASE) MCG/ACT inhaler INhaLe 2 PufFS by mouth every 4 hours as needed for shortness of breath or wheezing 07/19/14  Yes Historical  Provider, MD  XARELTO 20 MG TABS tablet TAKE 1 TABLET BY MOUTH EVERY DAY 11/13/14  Yes Jolaine Artist, MD    Allergies:  Allergies  Allergen Reactions  . Amiodarone Shortness Of Breath and Nausea And Vomiting  . Cymbalta [Duloxetine Hcl] Other (See Comments)    Depression  . Lyrica [Pregabalin] Other (See Comments)    Blurry vision    Social History   Social History  . Marital Status: Married    Spouse Name: N/A  . Number of Children: 5  . Years of Education: HS   Occupational History  . Retired    Social History Main Topics  . Smoking  status: Never Smoker   . Smokeless tobacco: Never Used  . Alcohol Use: No  . Drug Use: No  . Sexual Activity: Not on file   Other Topics Concern  . Not on file   Social History Narrative   Lives at home alone.   Right-handed.   Two cups caffeine daily (coffee).     Family History  Problem Relation Age of Onset  . Diabetes Brother   . Alzheimer's disease Sister   . CVA Daughter     01/21/09  . Pancreatic cancer Father 80  . Pneumonia Mother 32    cause of death  . Heart attack Neg Hx   . Stroke Daughter   . Hypertension Daughter      ROS:  Please see the history of present illness.     All other systems reviewed and negative.    Physical Exam: Blood pressure 108/50, pulse 76, height 5' 4.5" (1.638 m), weight 157 lb 9.6 oz (71.487 kg). General: Well developed, well nourished female in no acute distress. Head: Normocephalic, atraumatic, sclera non-icteric, no xanthomas, nares are without discharge. EENT: normal  Lymph Nodes:  none Neck: Negative for carotid bruits. JVD not elevated. Back:without scoliosis kyphosis Lungs: Clear bilaterally to auscultation without wheezes, rales, or rhonchi. Breathing is unlabored. Heart: RRR with S1 S2. 2/6 systolic murmur . No rubs, or gallops appreciated. Abdomen: Soft, non-tender, non-distended with normoactive bowel sounds. No hepatomegaly. No rebound/guarding. No obvious abdominal  masses. Msk:  Strength and tone appear normal for age. Extremities: No clubbing or cyanosis.  tr edema.  Distal pedal pulses are 2+ and equal bilaterally. Skin: Warm and Dry  capillary refill less than 1 second Milrinone infusing Neuro: Alert and oriented X 3. CN III-XII intact Grossly normal sensory and motor function . Psych:  Responds to questions appropriately with a normal affect.      Labs: Cardiac Enzymes No results for input(s): CKTOTAL, CKMB, TROPONINI in the last 72 hours. CBC Lab Results  Component Value Date   WBC 7.9 12/24/2014   HGB 11.5* 12/24/2014   HCT 35.2* 12/24/2014   MCV 92.4 12/24/2014   PLT 290 12/24/2014   PROTIME: No results for input(s): LABPROT, INR in the last 72 hours. Chemistry No results for input(s): NA, K, CL, CO2, BUN, CREATININE, CALCIUM, PROT, BILITOT, ALKPHOS, ALT, AST, GLUCOSE in the last 168 hours.  Invalid input(s): LABALBU Lipids No results found for: CHOL, HDL, LDLCALC, TRIG BNP PRO B NATRIURETIC PEPTIDE (BNP)  Date/Time Value Ref Range Status  09/25/2014 01:32 PM 2219.0* 0.0 - 100.0 pg/mL Final  12/18/2012 11:00 PM 173.5 0 - 450 pg/mL Final   Thyroid Function Tests: No results for input(s): TSH, T4TOTAL, T3FREE, THYROIDAB in the last 72 hours.  Invalid input(s): FREET3 Miscellaneous Lab Results  Component Value Date   DDIMER 8.14* 08/19/2014    Radiology/Studies:  Dg Chest 2 View  01/17/2015  CLINICAL DATA:  82 year old female status post cardiac pacemaker placement. Initial encounter. EXAM: CHEST  2 VIEW COMPARISON:  12/24/2014 and earlier. FINDINGS: Chronic elevation of the left hemidiaphragm re- demonstrated. Left chest 3 lead cardiac pacemaker device has been placed. Leads course to the right atrium, RV apex, and coronary sinus regions. Left subclavian approach PICC line also remains in place. No pneumothorax or pulmonary edema. Stable cardiac size and mediastinal contours. No acute pulmonary opacity. Stable surgical clips  along left chest wall and in the right upper quadrant. Stable visualized osseous structures. IMPRESSION: 1. Left chest cardiac pacemaker placed with  no adverse features. 2. Left subclavian approach PICC line remains in place and appears stable. Electronically Signed   By: Genevie Ann M.D.   On: 01/17/2015 08:01    EKG:  11/27/14 demonstrated sinus rhythm at 114 Intervals 17/15/44   Assessment and Plan:  Presumed nonischemic cardiomyopathy  CHF chronic systolic  LBBB  Sinus tachycardia  CRT  We have discussed the physiology of CRT extensively noted that with a change and offset for -40---60 there was an upright QRS in lead V1 and a negative QRS in lead 1. This was a change from the presenting ECG. We will anticipate an AV optimization echo when she returns 2/15 with A S NP in anticipation of her return visit to Dr. Reine Just  More than 50% of 25 min was spent in counseling related to the above   Virl Axe

## 2015-02-14 NOTE — Telephone Encounter (Signed)
ALTHOUGH I HAVE SPOKEN TO THE PATIENT/PATIENTS FAMILY MULTIPLE TIMES OVER THE PAST 1-2 WEEKS, NEVER DID WE DISCUSS COST FOR MEDICATIONS   WILL BE IN CONTACT WITH PATIENT/PATIENTS DAUGHTER ABOUT THIS ISSUE  **[facility] PENDING- SPOKE WITH DIANE SHE HAS REFERRAL PENDING**   SPOKE WITH PATTI- PATIENTS DAUGHTER FAMILY CONCERNED ABOUT COST OF DIGOXIN $300/MTH AS PHARMACY HAS BEEN FILLING LANOXIN, - WILL RESEND RX FOR DIGOXIN AND PT WILL HAVE TO CUT PILL IN HALF FOR THE CORRECT DOSE DAUGHTER REPLIES THIS WILL BE OK, AS SOMEONE IS HELPING MRS. TRACY WITH HER MEDS  ALSO COST OF CORLANOR IS UP TO $500 PER MONTH, BEGINNING OF THE YEAR AND PATIENT MUST MEET HER DEDUCTIBLE -ADVISED WILL SPEAK TO CHF PHARMD, ERIKA IS SEE OF ADDITIONAL OPTIONS ARE AVAILABLE, UNSURE WHAT WE CAN OFFER AT THIS TIME

## 2015-02-17 NOTE — Telephone Encounter (Signed)
Called daughter, Precious Bard, and left VM to call back. Pharmacy states Corlanor is too soon to fill until 03/05/15 but patient does have deductible to meet. She can use part of the $1500 allotted to her through the [employer] foundation but then this will run out quickly and she cannot reapply for further assistance until 12/2015.

## 2015-02-18 DIAGNOSIS — F329 Major depressive disorder, single episode, unspecified: Secondary | ICD-10-CM | POA: Diagnosis not present

## 2015-02-18 DIAGNOSIS — Z950 Presence of cardiac pacemaker: Secondary | ICD-10-CM | POA: Diagnosis not present

## 2015-02-18 DIAGNOSIS — Z48812 Encounter for surgical aftercare following surgery on the circulatory system: Secondary | ICD-10-CM | POA: Diagnosis not present

## 2015-02-18 DIAGNOSIS — I130 Hypertensive heart and chronic kidney disease with heart failure and stage 1 through stage 4 chronic kidney disease, or unspecified chronic kidney disease: Secondary | ICD-10-CM | POA: Diagnosis not present

## 2015-02-18 DIAGNOSIS — I429 Cardiomyopathy, unspecified: Secondary | ICD-10-CM | POA: Diagnosis not present

## 2015-02-18 DIAGNOSIS — I5023 Acute on chronic systolic (congestive) heart failure: Secondary | ICD-10-CM | POA: Diagnosis not present

## 2015-02-18 DIAGNOSIS — E669 Obesity, unspecified: Secondary | ICD-10-CM | POA: Diagnosis not present

## 2015-02-18 DIAGNOSIS — N183 Chronic kidney disease, stage 3 (moderate): Secondary | ICD-10-CM | POA: Diagnosis not present

## 2015-02-18 DIAGNOSIS — I2699 Other pulmonary embolism without acute cor pulmonale: Secondary | ICD-10-CM | POA: Diagnosis not present

## 2015-02-18 DIAGNOSIS — Z452 Encounter for adjustment and management of vascular access device: Secondary | ICD-10-CM | POA: Diagnosis not present

## 2015-02-18 DIAGNOSIS — J189 Pneumonia, unspecified organism: Secondary | ICD-10-CM | POA: Diagnosis not present

## 2015-02-18 DIAGNOSIS — I5043 Acute on chronic combined systolic (congestive) and diastolic (congestive) heart failure: Secondary | ICD-10-CM | POA: Diagnosis not present

## 2015-02-18 DIAGNOSIS — G609 Hereditary and idiopathic neuropathy, unspecified: Secondary | ICD-10-CM | POA: Diagnosis not present

## 2015-02-19 ENCOUNTER — Encounter (HOSPITAL_COMMUNITY): Payer: Commercial Managed Care - HMO | Admitting: Internal Medicine

## 2015-02-19 DIAGNOSIS — J189 Pneumonia, unspecified organism: Secondary | ICD-10-CM | POA: Diagnosis not present

## 2015-02-19 DIAGNOSIS — I5043 Acute on chronic combined systolic (congestive) and diastolic (congestive) heart failure: Secondary | ICD-10-CM | POA: Diagnosis not present

## 2015-02-19 DIAGNOSIS — I2699 Other pulmonary embolism without acute cor pulmonale: Secondary | ICD-10-CM | POA: Diagnosis not present

## 2015-02-20 ENCOUNTER — Telehealth (HOSPITAL_COMMUNITY): Payer: Self-pay

## 2015-02-20 DIAGNOSIS — I5023 Acute on chronic systolic (congestive) heart failure: Secondary | ICD-10-CM | POA: Diagnosis not present

## 2015-02-20 DIAGNOSIS — I130 Hypertensive heart and chronic kidney disease with heart failure and stage 1 through stage 4 chronic kidney disease, or unspecified chronic kidney disease: Secondary | ICD-10-CM | POA: Diagnosis not present

## 2015-02-20 DIAGNOSIS — Z452 Encounter for adjustment and management of vascular access device: Secondary | ICD-10-CM | POA: Diagnosis not present

## 2015-02-20 DIAGNOSIS — N183 Chronic kidney disease, stage 3 (moderate): Secondary | ICD-10-CM | POA: Diagnosis not present

## 2015-02-20 DIAGNOSIS — F329 Major depressive disorder, single episode, unspecified: Secondary | ICD-10-CM | POA: Diagnosis not present

## 2015-02-20 DIAGNOSIS — Z48812 Encounter for surgical aftercare following surgery on the circulatory system: Secondary | ICD-10-CM | POA: Diagnosis not present

## 2015-02-20 DIAGNOSIS — Z950 Presence of cardiac pacemaker: Secondary | ICD-10-CM | POA: Diagnosis not present

## 2015-02-20 DIAGNOSIS — J309 Allergic rhinitis, unspecified: Secondary | ICD-10-CM | POA: Diagnosis not present

## 2015-02-20 DIAGNOSIS — M5416 Radiculopathy, lumbar region: Secondary | ICD-10-CM | POA: Diagnosis not present

## 2015-02-20 DIAGNOSIS — E669 Obesity, unspecified: Secondary | ICD-10-CM | POA: Diagnosis not present

## 2015-02-20 DIAGNOSIS — G609 Hereditary and idiopathic neuropathy, unspecified: Secondary | ICD-10-CM | POA: Diagnosis not present

## 2015-02-20 NOTE — Telephone Encounter (Signed)
Patient's daughter called to speak to [name] She wanted to let [name] know that he mother will not be able to get into see Dr. Johnnye Sima until March Daughter voiced concerns to that office of it being so long until she is able in to their office.  The person at the office advised her to call back to [facility] to see what Dr. Haroldine Laws wanted to do

## 2015-02-21 ENCOUNTER — Other Ambulatory Visit (HOSPITAL_COMMUNITY): Payer: Self-pay | Admitting: Internal Medicine

## 2015-02-21 DIAGNOSIS — I5023 Acute on chronic systolic (congestive) heart failure: Secondary | ICD-10-CM | POA: Diagnosis not present

## 2015-02-21 DIAGNOSIS — Z452 Encounter for adjustment and management of vascular access device: Secondary | ICD-10-CM | POA: Diagnosis not present

## 2015-02-21 DIAGNOSIS — N183 Chronic kidney disease, stage 3 (moderate): Secondary | ICD-10-CM | POA: Diagnosis not present

## 2015-02-21 DIAGNOSIS — Z48812 Encounter for surgical aftercare following surgery on the circulatory system: Secondary | ICD-10-CM | POA: Diagnosis not present

## 2015-02-21 DIAGNOSIS — F329 Major depressive disorder, single episode, unspecified: Secondary | ICD-10-CM | POA: Diagnosis not present

## 2015-02-21 DIAGNOSIS — Z950 Presence of cardiac pacemaker: Secondary | ICD-10-CM | POA: Diagnosis not present

## 2015-02-21 DIAGNOSIS — E669 Obesity, unspecified: Secondary | ICD-10-CM | POA: Diagnosis not present

## 2015-02-21 DIAGNOSIS — I130 Hypertensive heart and chronic kidney disease with heart failure and stage 1 through stage 4 chronic kidney disease, or unspecified chronic kidney disease: Secondary | ICD-10-CM | POA: Diagnosis not present

## 2015-02-21 DIAGNOSIS — G609 Hereditary and idiopathic neuropathy, unspecified: Secondary | ICD-10-CM | POA: Diagnosis not present

## 2015-02-22 LAB — CUP PACEART INCLINIC DEVICE CHECK
Battery Remaining Longevity: 67.2
Brady Statistic RA Percent Paced: 4.7 %
Brady Statistic RV Percent Paced: 99 %
Date Time Interrogation Session: 20170127183116
Implantable Lead Implant Date: 20161229
Implantable Lead Implant Date: 20161229
Implantable Lead Location: 753858
Implantable Lead Location: 753859
Lead Channel Impedance Value: 650 Ohm
Lead Channel Pacing Threshold Amplitude: 0.75 V
Lead Channel Pacing Threshold Amplitude: 2.25 V
Lead Channel Sensing Intrinsic Amplitude: 12 mV
Lead Channel Setting Pacing Amplitude: 1.625
Lead Channel Setting Sensing Sensitivity: 2 mV
MDC IDC LEAD IMPLANT DT: 20161229
MDC IDC LEAD LOCATION: 753860
MDC IDC MSMT BATTERY VOLTAGE: 3.01 V
MDC IDC MSMT LEADCHNL LV IMPEDANCE VALUE: 837.5 Ohm
MDC IDC MSMT LEADCHNL LV PACING THRESHOLD PULSEWIDTH: 1 ms
MDC IDC MSMT LEADCHNL RA IMPEDANCE VALUE: 425 Ohm
MDC IDC MSMT LEADCHNL RA PACING THRESHOLD PULSEWIDTH: 0.5 ms
MDC IDC MSMT LEADCHNL RA SENSING INTR AMPL: 5 mV
MDC IDC MSMT LEADCHNL RV PACING THRESHOLD AMPLITUDE: 0.75 V
MDC IDC MSMT LEADCHNL RV PACING THRESHOLD PULSEWIDTH: 0.5 ms
MDC IDC SET LEADCHNL LV PACING AMPLITUDE: 3 V
MDC IDC SET LEADCHNL LV PACING PULSEWIDTH: 1 ms
MDC IDC SET LEADCHNL RV PACING AMPLITUDE: 2 V
MDC IDC SET LEADCHNL RV PACING PULSEWIDTH: 0.5 ms
Pulse Gen Model: 3262
Pulse Gen Serial Number: 7834528

## 2015-02-25 DIAGNOSIS — G609 Hereditary and idiopathic neuropathy, unspecified: Secondary | ICD-10-CM | POA: Diagnosis not present

## 2015-02-25 DIAGNOSIS — Z452 Encounter for adjustment and management of vascular access device: Secondary | ICD-10-CM | POA: Diagnosis not present

## 2015-02-25 DIAGNOSIS — I429 Cardiomyopathy, unspecified: Secondary | ICD-10-CM | POA: Diagnosis not present

## 2015-02-25 DIAGNOSIS — F329 Major depressive disorder, single episode, unspecified: Secondary | ICD-10-CM | POA: Diagnosis not present

## 2015-02-25 DIAGNOSIS — Z950 Presence of cardiac pacemaker: Secondary | ICD-10-CM | POA: Diagnosis not present

## 2015-02-25 DIAGNOSIS — I5023 Acute on chronic systolic (congestive) heart failure: Secondary | ICD-10-CM | POA: Diagnosis not present

## 2015-02-25 DIAGNOSIS — Z48812 Encounter for surgical aftercare following surgery on the circulatory system: Secondary | ICD-10-CM | POA: Diagnosis not present

## 2015-02-25 DIAGNOSIS — I2699 Other pulmonary embolism without acute cor pulmonale: Secondary | ICD-10-CM | POA: Diagnosis not present

## 2015-02-25 DIAGNOSIS — I5043 Acute on chronic combined systolic (congestive) and diastolic (congestive) heart failure: Secondary | ICD-10-CM | POA: Diagnosis not present

## 2015-02-25 DIAGNOSIS — N183 Chronic kidney disease, stage 3 (moderate): Secondary | ICD-10-CM | POA: Diagnosis not present

## 2015-02-25 DIAGNOSIS — I130 Hypertensive heart and chronic kidney disease with heart failure and stage 1 through stage 4 chronic kidney disease, or unspecified chronic kidney disease: Secondary | ICD-10-CM | POA: Diagnosis not present

## 2015-02-25 DIAGNOSIS — E669 Obesity, unspecified: Secondary | ICD-10-CM | POA: Diagnosis not present

## 2015-02-25 DIAGNOSIS — J189 Pneumonia, unspecified organism: Secondary | ICD-10-CM | POA: Diagnosis not present

## 2015-02-26 ENCOUNTER — Telehealth (HOSPITAL_COMMUNITY): Payer: Self-pay | Admitting: Pharmacist

## 2015-02-26 DIAGNOSIS — I130 Hypertensive heart and chronic kidney disease with heart failure and stage 1 through stage 4 chronic kidney disease, or unspecified chronic kidney disease: Secondary | ICD-10-CM | POA: Diagnosis not present

## 2015-02-26 DIAGNOSIS — E669 Obesity, unspecified: Secondary | ICD-10-CM | POA: Diagnosis not present

## 2015-02-26 DIAGNOSIS — Z950 Presence of cardiac pacemaker: Secondary | ICD-10-CM | POA: Diagnosis not present

## 2015-02-26 DIAGNOSIS — I5023 Acute on chronic systolic (congestive) heart failure: Secondary | ICD-10-CM | POA: Diagnosis not present

## 2015-02-26 DIAGNOSIS — F329 Major depressive disorder, single episode, unspecified: Secondary | ICD-10-CM | POA: Diagnosis not present

## 2015-02-26 DIAGNOSIS — Z452 Encounter for adjustment and management of vascular access device: Secondary | ICD-10-CM | POA: Diagnosis not present

## 2015-02-26 DIAGNOSIS — G609 Hereditary and idiopathic neuropathy, unspecified: Secondary | ICD-10-CM | POA: Diagnosis not present

## 2015-02-26 DIAGNOSIS — Z48812 Encounter for surgical aftercare following surgery on the circulatory system: Secondary | ICD-10-CM | POA: Diagnosis not present

## 2015-02-26 DIAGNOSIS — N183 Chronic kidney disease, stage 3 (moderate): Secondary | ICD-10-CM | POA: Diagnosis not present

## 2015-02-26 NOTE — Telephone Encounter (Signed)
Spoke with Ms. Austria's daughter who stated that she has been more sluggish and drowsy lately and was wondering if any of her medications could be causing this. She did state that she had been taking her Celexa in the evening but recently noted that she has been taking it in the morning. Since Celexa can cause drowsiness or insomnia, I suggested trying it in the evening to see if this makes any difference. She does not take alprazolam or tramadol often which could also cause these side effects. Her daughter also states that her thyroid will be checked again in March but that she has been stable on her current dose of levothyroxine for years. Daughter verbalized understanding and will call if further worsening.   [name]. Velva Harman, PharmD, BCPS, CPP Clinical Pharmacist Pager: [phone] Phone: [phone] 02/26/2015 10:40 AM

## 2015-02-27 ENCOUNTER — Telehealth (HOSPITAL_COMMUNITY): Payer: Self-pay | Admitting: Pharmacist

## 2015-02-27 DIAGNOSIS — Z48812 Encounter for surgical aftercare following surgery on the circulatory system: Secondary | ICD-10-CM | POA: Diagnosis not present

## 2015-02-27 DIAGNOSIS — G609 Hereditary and idiopathic neuropathy, unspecified: Secondary | ICD-10-CM | POA: Diagnosis not present

## 2015-02-27 DIAGNOSIS — F329 Major depressive disorder, single episode, unspecified: Secondary | ICD-10-CM | POA: Diagnosis not present

## 2015-02-27 DIAGNOSIS — Z950 Presence of cardiac pacemaker: Secondary | ICD-10-CM | POA: Diagnosis not present

## 2015-02-27 DIAGNOSIS — I130 Hypertensive heart and chronic kidney disease with heart failure and stage 1 through stage 4 chronic kidney disease, or unspecified chronic kidney disease: Secondary | ICD-10-CM | POA: Diagnosis not present

## 2015-02-27 DIAGNOSIS — I5023 Acute on chronic systolic (congestive) heart failure: Secondary | ICD-10-CM | POA: Diagnosis not present

## 2015-02-27 DIAGNOSIS — N183 Chronic kidney disease, stage 3 (moderate): Secondary | ICD-10-CM | POA: Diagnosis not present

## 2015-02-27 DIAGNOSIS — Z452 Encounter for adjustment and management of vascular access device: Secondary | ICD-10-CM | POA: Diagnosis not present

## 2015-02-27 DIAGNOSIS — E669 Obesity, unspecified: Secondary | ICD-10-CM | POA: Diagnosis not present

## 2015-02-27 NOTE — Telephone Encounter (Signed)
Spoke with patient's daughter Precious Bard who asked about LOT of Xarelto for her mother's history of PE in August 2016. She was told by Dr. Haroldine Laws that he would like her to complete 6 months of therapy. She is not having any issues with bleeding so I have advised to continue treatment until next appointment with Dr. Haroldine Laws on 2/23 at which point they can discuss the risks vs benefits of continuation.   [name]. Velva Harman, PharmD, BCPS, CPP Clinical Pharmacist Pager: [phone] Phone: [phone] 02/27/2015 3:37 PM

## 2015-02-28 DIAGNOSIS — N183 Chronic kidney disease, stage 3 (moderate): Secondary | ICD-10-CM | POA: Diagnosis not present

## 2015-02-28 DIAGNOSIS — G609 Hereditary and idiopathic neuropathy, unspecified: Secondary | ICD-10-CM | POA: Diagnosis not present

## 2015-02-28 DIAGNOSIS — I5023 Acute on chronic systolic (congestive) heart failure: Secondary | ICD-10-CM | POA: Diagnosis not present

## 2015-02-28 DIAGNOSIS — Z950 Presence of cardiac pacemaker: Secondary | ICD-10-CM | POA: Diagnosis not present

## 2015-02-28 DIAGNOSIS — Z452 Encounter for adjustment and management of vascular access device: Secondary | ICD-10-CM | POA: Diagnosis not present

## 2015-02-28 DIAGNOSIS — Z48812 Encounter for surgical aftercare following surgery on the circulatory system: Secondary | ICD-10-CM | POA: Diagnosis not present

## 2015-02-28 DIAGNOSIS — E669 Obesity, unspecified: Secondary | ICD-10-CM | POA: Diagnosis not present

## 2015-02-28 DIAGNOSIS — I130 Hypertensive heart and chronic kidney disease with heart failure and stage 1 through stage 4 chronic kidney disease, or unspecified chronic kidney disease: Secondary | ICD-10-CM | POA: Diagnosis not present

## 2015-02-28 DIAGNOSIS — F329 Major depressive disorder, single episode, unspecified: Secondary | ICD-10-CM | POA: Diagnosis not present

## 2015-03-03 DIAGNOSIS — I5023 Acute on chronic systolic (congestive) heart failure: Secondary | ICD-10-CM | POA: Diagnosis not present

## 2015-03-03 DIAGNOSIS — Z950 Presence of cardiac pacemaker: Secondary | ICD-10-CM | POA: Diagnosis not present

## 2015-03-03 DIAGNOSIS — I130 Hypertensive heart and chronic kidney disease with heart failure and stage 1 through stage 4 chronic kidney disease, or unspecified chronic kidney disease: Secondary | ICD-10-CM | POA: Diagnosis not present

## 2015-03-03 DIAGNOSIS — G609 Hereditary and idiopathic neuropathy, unspecified: Secondary | ICD-10-CM | POA: Diagnosis not present

## 2015-03-03 DIAGNOSIS — F329 Major depressive disorder, single episode, unspecified: Secondary | ICD-10-CM | POA: Diagnosis not present

## 2015-03-03 DIAGNOSIS — N183 Chronic kidney disease, stage 3 (moderate): Secondary | ICD-10-CM | POA: Diagnosis not present

## 2015-03-03 DIAGNOSIS — Z48812 Encounter for surgical aftercare following surgery on the circulatory system: Secondary | ICD-10-CM | POA: Diagnosis not present

## 2015-03-03 DIAGNOSIS — E669 Obesity, unspecified: Secondary | ICD-10-CM | POA: Diagnosis not present

## 2015-03-03 DIAGNOSIS — Z452 Encounter for adjustment and management of vascular access device: Secondary | ICD-10-CM | POA: Diagnosis not present

## 2015-03-04 ENCOUNTER — Other Ambulatory Visit (HOSPITAL_COMMUNITY): Payer: Self-pay | Admitting: Internal Medicine

## 2015-03-04 DIAGNOSIS — J189 Pneumonia, unspecified organism: Secondary | ICD-10-CM | POA: Diagnosis not present

## 2015-03-04 DIAGNOSIS — Z452 Encounter for adjustment and management of vascular access device: Secondary | ICD-10-CM | POA: Diagnosis not present

## 2015-03-04 DIAGNOSIS — I130 Hypertensive heart and chronic kidney disease with heart failure and stage 1 through stage 4 chronic kidney disease, or unspecified chronic kidney disease: Secondary | ICD-10-CM | POA: Diagnosis not present

## 2015-03-04 DIAGNOSIS — I429 Cardiomyopathy, unspecified: Secondary | ICD-10-CM | POA: Diagnosis not present

## 2015-03-04 DIAGNOSIS — F329 Major depressive disorder, single episode, unspecified: Secondary | ICD-10-CM | POA: Diagnosis not present

## 2015-03-04 DIAGNOSIS — I5043 Acute on chronic combined systolic (congestive) and diastolic (congestive) heart failure: Secondary | ICD-10-CM | POA: Diagnosis not present

## 2015-03-04 DIAGNOSIS — Z48812 Encounter for surgical aftercare following surgery on the circulatory system: Secondary | ICD-10-CM | POA: Diagnosis not present

## 2015-03-04 DIAGNOSIS — G609 Hereditary and idiopathic neuropathy, unspecified: Secondary | ICD-10-CM | POA: Diagnosis not present

## 2015-03-04 DIAGNOSIS — Z950 Presence of cardiac pacemaker: Secondary | ICD-10-CM | POA: Diagnosis not present

## 2015-03-04 DIAGNOSIS — I2699 Other pulmonary embolism without acute cor pulmonale: Secondary | ICD-10-CM | POA: Diagnosis not present

## 2015-03-04 DIAGNOSIS — N183 Chronic kidney disease, stage 3 (moderate): Secondary | ICD-10-CM | POA: Diagnosis not present

## 2015-03-04 DIAGNOSIS — I5023 Acute on chronic systolic (congestive) heart failure: Secondary | ICD-10-CM | POA: Diagnosis not present

## 2015-03-04 DIAGNOSIS — E669 Obesity, unspecified: Secondary | ICD-10-CM | POA: Diagnosis not present

## 2015-03-04 NOTE — Progress Notes (Signed)
    AV Optimization Echo Note Date: 03/04/2015  ID:  Alexandra [name], DOB September 29, 1927, MRN [id number]  PCP:  Melinda Crutch, MD Primary Cardiologist: [place] Electrophysiologist: Marjo Bicker is a 82 y.o. female is seen today for Dr Caryl Comes.  She presents today for AV optimization of CRT.  Since last being seen in our clinic, the patient reports that she is symptomatically improved.    The patient is predominately atrially sensed.  Device was programmed with a  PAV of 160 and SAV of 120 on presentation.  Sensed AV delays were evaluated from 100 msec to 150 msec.  Optimal separation of E/A waves was noted at 110 msec.    Device was reprogrammed today to a SAV of 110 msec and a PAV of 162msec.    The patient will keep follow up as scheduled with Dr Haroldine Laws, do remote transmissions with Merlin and follow up with Dr Caryl Comes in 6 months.   Fidel Levy, NP  03/04/2015 8:31 AM     [facility] [address] [place] [address] [phone] (office) [phone] (fax)

## 2015-03-05 ENCOUNTER — Other Ambulatory Visit: Payer: Self-pay

## 2015-03-05 ENCOUNTER — Encounter: Payer: Self-pay | Admitting: Internal Medicine

## 2015-03-05 ENCOUNTER — Ambulatory Visit (INDEPENDENT_AMBULATORY_CARE_PROVIDER_SITE_OTHER): Payer: Commercial Managed Care - HMO | Admitting: Nurse Practitioner

## 2015-03-05 ENCOUNTER — Other Ambulatory Visit (HOSPITAL_COMMUNITY): Payer: Commercial Managed Care - HMO

## 2015-03-05 ENCOUNTER — Ambulatory Visit (HOSPITAL_COMMUNITY): Payer: Commercial Managed Care - HMO | Attending: Cardiovascular Disease

## 2015-03-05 ENCOUNTER — Other Ambulatory Visit: Payer: Self-pay | Admitting: Internal Medicine

## 2015-03-05 DIAGNOSIS — I5022 Chronic systolic (congestive) heart failure: Secondary | ICD-10-CM

## 2015-03-05 DIAGNOSIS — I517 Cardiomegaly: Secondary | ICD-10-CM | POA: Insufficient documentation

## 2015-03-05 DIAGNOSIS — I429 Cardiomyopathy, unspecified: Secondary | ICD-10-CM | POA: Insufficient documentation

## 2015-03-05 DIAGNOSIS — I351 Nonrheumatic aortic (valve) insufficiency: Secondary | ICD-10-CM | POA: Diagnosis not present

## 2015-03-05 DIAGNOSIS — I428 Other cardiomyopathies: Secondary | ICD-10-CM

## 2015-03-05 DIAGNOSIS — I509 Heart failure, unspecified: Secondary | ICD-10-CM | POA: Diagnosis present

## 2015-03-05 LAB — CUP PACEART INCLINIC DEVICE CHECK
Implantable Lead Implant Date: 20161229
Implantable Lead Location: 753860
MDC IDC LEAD IMPLANT DT: 20161229
MDC IDC LEAD IMPLANT DT: 20161229
MDC IDC LEAD LOCATION: 753858
MDC IDC LEAD LOCATION: 753859
MDC IDC PG SERIAL: 7834528
MDC IDC SESS DTM: 20170215151056

## 2015-03-06 DIAGNOSIS — N183 Chronic kidney disease, stage 3 (moderate): Secondary | ICD-10-CM | POA: Diagnosis not present

## 2015-03-06 DIAGNOSIS — Z452 Encounter for adjustment and management of vascular access device: Secondary | ICD-10-CM | POA: Diagnosis not present

## 2015-03-06 DIAGNOSIS — Z950 Presence of cardiac pacemaker: Secondary | ICD-10-CM | POA: Diagnosis not present

## 2015-03-06 DIAGNOSIS — F329 Major depressive disorder, single episode, unspecified: Secondary | ICD-10-CM | POA: Diagnosis not present

## 2015-03-06 DIAGNOSIS — I5023 Acute on chronic systolic (congestive) heart failure: Secondary | ICD-10-CM | POA: Diagnosis not present

## 2015-03-06 DIAGNOSIS — G609 Hereditary and idiopathic neuropathy, unspecified: Secondary | ICD-10-CM | POA: Diagnosis not present

## 2015-03-06 DIAGNOSIS — Z48812 Encounter for surgical aftercare following surgery on the circulatory system: Secondary | ICD-10-CM | POA: Diagnosis not present

## 2015-03-06 DIAGNOSIS — I130 Hypertensive heart and chronic kidney disease with heart failure and stage 1 through stage 4 chronic kidney disease, or unspecified chronic kidney disease: Secondary | ICD-10-CM | POA: Diagnosis not present

## 2015-03-06 DIAGNOSIS — E669 Obesity, unspecified: Secondary | ICD-10-CM | POA: Diagnosis not present

## 2015-03-07 ENCOUNTER — Telehealth: Payer: Self-pay | Admitting: Internal Medicine

## 2015-03-07 DIAGNOSIS — R490 Dysphonia: Secondary | ICD-10-CM | POA: Diagnosis not present

## 2015-03-07 NOTE — Telephone Encounter (Signed)
New message      Pt had a pacemaker adjusted a few days ago.  She is still dizzy.  Please call

## 2015-03-07 NOTE — Telephone Encounter (Signed)
Daughter returned call. Pt c/o dizziness at night and very minimal dizziness during the day. Daughter says that Chanetta Marshall, NP told them that the patient may experience dizziness for a couple of days after the adjustment. BPs taken daily have not been abnormal. Reviewed with Chanetta Marshall- Patient advised to review symptoms with Dr. Haroldine Laws next week.  If further recommendations are made, I will contact the patient. She verbalizes understanding.

## 2015-03-07 NOTE — Telephone Encounter (Signed)
LMOM to call back to device clinic. 

## 2015-03-10 DIAGNOSIS — I5023 Acute on chronic systolic (congestive) heart failure: Secondary | ICD-10-CM | POA: Diagnosis not present

## 2015-03-10 DIAGNOSIS — F329 Major depressive disorder, single episode, unspecified: Secondary | ICD-10-CM | POA: Diagnosis not present

## 2015-03-10 DIAGNOSIS — Z950 Presence of cardiac pacemaker: Secondary | ICD-10-CM | POA: Diagnosis not present

## 2015-03-10 DIAGNOSIS — I130 Hypertensive heart and chronic kidney disease with heart failure and stage 1 through stage 4 chronic kidney disease, or unspecified chronic kidney disease: Secondary | ICD-10-CM | POA: Diagnosis not present

## 2015-03-10 DIAGNOSIS — N183 Chronic kidney disease, stage 3 (moderate): Secondary | ICD-10-CM | POA: Diagnosis not present

## 2015-03-10 DIAGNOSIS — E669 Obesity, unspecified: Secondary | ICD-10-CM | POA: Diagnosis not present

## 2015-03-10 DIAGNOSIS — G609 Hereditary and idiopathic neuropathy, unspecified: Secondary | ICD-10-CM | POA: Diagnosis not present

## 2015-03-10 DIAGNOSIS — Z452 Encounter for adjustment and management of vascular access device: Secondary | ICD-10-CM | POA: Diagnosis not present

## 2015-03-10 DIAGNOSIS — Z48812 Encounter for surgical aftercare following surgery on the circulatory system: Secondary | ICD-10-CM | POA: Diagnosis not present

## 2015-03-11 ENCOUNTER — Telehealth (HOSPITAL_COMMUNITY): Payer: Self-pay | Admitting: Pharmacist

## 2015-03-11 ENCOUNTER — Other Ambulatory Visit (HOSPITAL_COMMUNITY): Payer: Self-pay | Admitting: Internal Medicine

## 2015-03-11 DIAGNOSIS — I5043 Acute on chronic combined systolic (congestive) and diastolic (congestive) heart failure: Secondary | ICD-10-CM | POA: Diagnosis not present

## 2015-03-11 DIAGNOSIS — I2699 Other pulmonary embolism without acute cor pulmonale: Secondary | ICD-10-CM | POA: Diagnosis not present

## 2015-03-11 DIAGNOSIS — E669 Obesity, unspecified: Secondary | ICD-10-CM | POA: Diagnosis not present

## 2015-03-11 DIAGNOSIS — G609 Hereditary and idiopathic neuropathy, unspecified: Secondary | ICD-10-CM | POA: Diagnosis not present

## 2015-03-11 DIAGNOSIS — I130 Hypertensive heart and chronic kidney disease with heart failure and stage 1 through stage 4 chronic kidney disease, or unspecified chronic kidney disease: Secondary | ICD-10-CM | POA: Diagnosis not present

## 2015-03-11 DIAGNOSIS — J189 Pneumonia, unspecified organism: Secondary | ICD-10-CM | POA: Diagnosis not present

## 2015-03-11 DIAGNOSIS — Z950 Presence of cardiac pacemaker: Secondary | ICD-10-CM | POA: Diagnosis not present

## 2015-03-11 DIAGNOSIS — F329 Major depressive disorder, single episode, unspecified: Secondary | ICD-10-CM | POA: Diagnosis not present

## 2015-03-11 DIAGNOSIS — Z452 Encounter for adjustment and management of vascular access device: Secondary | ICD-10-CM | POA: Diagnosis not present

## 2015-03-11 DIAGNOSIS — N183 Chronic kidney disease, stage 3 (moderate): Secondary | ICD-10-CM | POA: Diagnosis not present

## 2015-03-11 DIAGNOSIS — I429 Cardiomyopathy, unspecified: Secondary | ICD-10-CM | POA: Diagnosis not present

## 2015-03-11 DIAGNOSIS — I5023 Acute on chronic systolic (congestive) heart failure: Secondary | ICD-10-CM | POA: Diagnosis not present

## 2015-03-11 DIAGNOSIS — Z48812 Encounter for surgical aftercare following surgery on the circulatory system: Secondary | ICD-10-CM | POA: Diagnosis not present

## 2015-03-11 NOTE — Telephone Encounter (Signed)
Received a call from Kathe Mariner, Ms. Aultman's daughter, stating that her Corlanor cost was >$400. I called Truman Hayward, pharmacist at [facility], who stated that this was because she had a deductible to meet. She still has >$1200 worth of [employer] funding available for use. Lee called [employer] help line and determined that if the copay cost of the medication is >50% of the cash price, the pharmacist needs to call [employer] and tell them the total copay cost. They will then process it (takes 6-8 hours) and the pharmacy can then re-process it and it should cover the cost. This information was relayed to [facility] who verbalized understanding.   [name]. Velva Harman, PharmD, BCPS, CPP Clinical Pharmacist Pager: [phone] Phone: [phone] 03/11/2015 12:34 PM

## 2015-03-12 DIAGNOSIS — F329 Major depressive disorder, single episode, unspecified: Secondary | ICD-10-CM | POA: Diagnosis not present

## 2015-03-12 DIAGNOSIS — I5023 Acute on chronic systolic (congestive) heart failure: Secondary | ICD-10-CM | POA: Diagnosis not present

## 2015-03-12 DIAGNOSIS — G609 Hereditary and idiopathic neuropathy, unspecified: Secondary | ICD-10-CM | POA: Diagnosis not present

## 2015-03-12 DIAGNOSIS — Z48812 Encounter for surgical aftercare following surgery on the circulatory system: Secondary | ICD-10-CM | POA: Diagnosis not present

## 2015-03-12 DIAGNOSIS — E669 Obesity, unspecified: Secondary | ICD-10-CM | POA: Diagnosis not present

## 2015-03-12 DIAGNOSIS — Z950 Presence of cardiac pacemaker: Secondary | ICD-10-CM | POA: Diagnosis not present

## 2015-03-12 DIAGNOSIS — Z452 Encounter for adjustment and management of vascular access device: Secondary | ICD-10-CM | POA: Diagnosis not present

## 2015-03-12 DIAGNOSIS — I130 Hypertensive heart and chronic kidney disease with heart failure and stage 1 through stage 4 chronic kidney disease, or unspecified chronic kidney disease: Secondary | ICD-10-CM | POA: Diagnosis not present

## 2015-03-12 DIAGNOSIS — N183 Chronic kidney disease, stage 3 (moderate): Secondary | ICD-10-CM | POA: Diagnosis not present

## 2015-03-13 ENCOUNTER — Ambulatory Visit (HOSPITAL_COMMUNITY)
Admission: RE | Admit: 2015-03-13 | Discharge: 2015-03-13 | Disposition: A | Discharge status: Home / Self Care | Payer: Commercial Managed Care - HMO | Source: Ambulatory Visit | Attending: Internal Medicine | Admitting: Internal Medicine

## 2015-03-13 VITALS — BP 128/52 | HR 77 | Wt 161.0 lb

## 2015-03-13 DIAGNOSIS — I130 Hypertensive heart and chronic kidney disease with heart failure and stage 1 through stage 4 chronic kidney disease, or unspecified chronic kidney disease: Secondary | ICD-10-CM | POA: Insufficient documentation

## 2015-03-13 DIAGNOSIS — F329 Major depressive disorder, single episode, unspecified: Secondary | ICD-10-CM | POA: Insufficient documentation

## 2015-03-13 DIAGNOSIS — J45909 Unspecified asthma, uncomplicated: Secondary | ICD-10-CM | POA: Diagnosis not present

## 2015-03-13 DIAGNOSIS — N183 Chronic kidney disease, stage 3 (moderate): Secondary | ICD-10-CM | POA: Insufficient documentation

## 2015-03-13 DIAGNOSIS — Z823 Family history of stroke: Secondary | ICD-10-CM | POA: Insufficient documentation

## 2015-03-13 DIAGNOSIS — Z86711 Personal history of pulmonary embolism: Secondary | ICD-10-CM | POA: Insufficient documentation

## 2015-03-13 DIAGNOSIS — E7800 Pure hypercholesterolemia, unspecified: Secondary | ICD-10-CM | POA: Insufficient documentation

## 2015-03-13 DIAGNOSIS — Z95810 Presence of automatic (implantable) cardiac defibrillator: Secondary | ICD-10-CM | POA: Insufficient documentation

## 2015-03-13 DIAGNOSIS — Z79899 Other long term (current) drug therapy: Secondary | ICD-10-CM | POA: Insufficient documentation

## 2015-03-13 DIAGNOSIS — I447 Left bundle-branch block, unspecified: Secondary | ICD-10-CM | POA: Insufficient documentation

## 2015-03-13 DIAGNOSIS — Z86718 Personal history of other venous thrombosis and embolism: Secondary | ICD-10-CM | POA: Insufficient documentation

## 2015-03-13 DIAGNOSIS — Z7902 Long term (current) use of antithrombotics/antiplatelets: Secondary | ICD-10-CM | POA: Insufficient documentation

## 2015-03-13 DIAGNOSIS — G609 Hereditary and idiopathic neuropathy, unspecified: Secondary | ICD-10-CM | POA: Insufficient documentation

## 2015-03-13 DIAGNOSIS — Z8249 Family history of ischemic heart disease and other diseases of the circulatory system: Secondary | ICD-10-CM | POA: Insufficient documentation

## 2015-03-13 DIAGNOSIS — Z888 Allergy status to other drugs, medicaments and biological substances status: Secondary | ICD-10-CM | POA: Insufficient documentation

## 2015-03-13 DIAGNOSIS — Z853 Personal history of malignant neoplasm of breast: Secondary | ICD-10-CM | POA: Diagnosis not present

## 2015-03-13 DIAGNOSIS — E039 Hypothyroidism, unspecified: Secondary | ICD-10-CM | POA: Diagnosis not present

## 2015-03-13 DIAGNOSIS — R7881 Bacteremia: Secondary | ICD-10-CM | POA: Diagnosis not present

## 2015-03-13 DIAGNOSIS — I5022 Chronic systolic (congestive) heart failure: Secondary | ICD-10-CM | POA: Diagnosis not present

## 2015-03-13 MED ORDER — HYDRALAZINE HCL 25 MG PO TABS: 12.5000 mg | ORAL_TABLET | Freq: Three times a day (TID) | ORAL | Status: DC

## 2015-03-13 NOTE — Progress Notes (Signed)
Patient ID: Alexandra [name], female   DOB: May 09, 1927, 82 y.o.   MRN: [id number] Patient ID: Alexandra [name], female   DOB: 01/09/1928, 82 y.o.   MRN: [id number]     Advanced Heart Failure Clinic Note   Primary Care: Dr. Melinda Crutch Primary Cardiologist: Dr Candee Furbish Primary HF: Dr Haroldine Laws  HPI: Alexandra [name] is a 81 y.o. 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 Discharge weight was 196.  She was admitted in July 2016 for Abdominal pain and SOB. Her Gallbladder was thought to be causing her pain, so a lap choley was performed that admission. Echo on 08/15/14 showed EF 20%. There were thoughts that she may have stress induced CMP due to the death of her husband in 2022/05/05 after several months of hospice care.   She was directly admitted to [facility] on 10/11/14 with concerns for low output with SBPs in 80s and tachycardia.  PICC line was placed with initial co-ox of 61%.CVP was 15. Milrinone started when co-ox dropped to 53%. She had SVT on milrinone 0.25 so started on amio. Developed acute SOB that improved quickly with cessation of amio and extra dose of IV lasix. Felt to possibly have acute amio toxicity. Milrinone decreased to 0.125 and no further PSVT. Sent home on milrinone 0.125. Diuresed well on 80 mg lasix IB BID. Discharge weight was 170.  Admitted 12/1 through 12/6 with PICC line infection. Bcx with [facility] 2/2 with for diptheroids. Bcx 1/2 at [facility]-Bacteremia-cornybacterium . PICC line replaced. Completed antibiotic course. She continued on milrinone 0.125 mcg. Started ivabradine.  Placement of CRT delayed.  S/p CRT-D placement 01/16/15 with Dr Caryl Comes.   Labs 01/29/15  with WBCs trending up 7.9 -> 10 -> 12. BCx ordered with [facility]. On 01/31/15 [facility] with 1/2 BCX + GPC  clusters. (I do not have final results from them) Refused admission. Treated with home vancomycin for 1 week. PICC line not changed.   She returns today for regular follow up. At last visit was feeling OK but co-ox 50% (02/05/15) so milrinone continued. Says she feels pretty good. Still tired. Struggles with ADLs but not as much as before. Appetite improved. No fevers or chills. Weight going up a bit. Underwent CRT optimization with EP last week. Had a pinched nerve in her left leg just before her surgery and has had pain from that after getting injection. No PND or orthopnea. HHRN is measures ankles and belly without change.    ECHO 10/02/14 LVEF 15% with diffuse hypokinesis, RV mildly dilated, normal function, PA peak pressure 59 mmHg   Past Medical History  Diagnosis Date  . Arthritis   . Headache(784.0)   . Anxiety   . Depression   . Asthma   . CAP (community acquired pneumonia) 12/19/2012  . Obesity   . Peripheral neuropathy ([place])   . Sacroiliitis, not elsewhere classified ([place])   . Osteopenia   . Hypercholesteremia   . Neuropathy ([place])   . Trigger finger     [name]  . Chest pain     NM stress test 05/2011 Normal  . DOE (dyspnea on exertion)     No SOB at rest  . Fatigue     excertional  . breast ca dx'd 2000  left  . Essential (primary) hypertension 04/02/2014  . Adult hypothyroidism 04/02/2014  . Hereditary and idiopathic peripheral neuropathy 07/02/2014  . Leg weakness   . CKD (chronic kidney disease) stage 3, GFR 30-59 ml/min 08/13/2014  . Anemia of chronic disease 08/13/2014  . CHF (congestive heart failure) ([place]) 08/19/2014  . Situational depression   . Presence of permanent cardiac pacemaker 01/16/2015    BIV  . Intermittent LBBB (left bundle branch block) 12/21/2012    Current Outpatient Prescriptions  Medication Sig Dispense Refill  . acetaminophen (TYLENOL) 500 MG tablet Take 500 mg by mouth every 6 (six) hours as needed for mild pain or headache.     .  albuterol (PROVENTIL HFA;VENTOLIN HFA) 108 (90 BASE) MCG/ACT inhaler Inhale 2 puffs into the lungs every 6 (six) hours as needed for wheezing or shortness of breath.    . ALPRAZolam (XANAX) 0.25 MG tablet Take 0.25 mg by mouth 3 (three) times daily as needed for anxiety.    Marland Kitchen b complex vitamins tablet Take 1 tablet by mouth daily with lunch.     . calcium carbonate (TUMS - DOSED IN MG ELEMENTAL CALCIUM) 500 MG chewable tablet Chew 2 tablets by mouth daily as needed for indigestion or heartburn.    . Calcium Carbonate-Vitamin D (CALTRATE 600+D PO) Take 1 tablet by mouth daily with lunch.    . cholecalciferol (VITAMIN D) 1000 UNITS tablet Take 1,000 Units by mouth daily with lunch.     . citalopram (CELEXA) 20 MG tablet Take 1 tablet (20 mg total) by mouth daily. 30 tablet 6  . CORLANOR 5 MG TABS tablet TAKE 1 TABLET BY MOUTH TWICE DAILY WITH A MEAL.. 60 tablet 3  . digoxin (LANOXIN) 0.125 MG tablet Take 0.5 tablets (0.0625 mg total) by mouth daily. 30 tablet 6  . furosemide (LASIX) 40 MG tablet Take 1 tablet (40 mg total) by mouth daily. 30 tablet 6  . lactose free nutrition (BOOST) LIQD Take 237 mLs by mouth daily with breakfast.     . levothyroxine (SYNTHROID, LEVOTHROID) 25 MCG tablet Take 25 mcg by mouth daily before breakfast. for thyroid    . MAGNESIUM-OXIDE 400 (241.3 Mg) MG tablet Take 400 mg by mouth daily.  3  . milrinone (PRIMACOR) 20 MG/100ML SOLN infusion Inject 9.65 mcg/min into the vein continuous. 2100 mL 0  . Multiple Vitamin (MULITIVITAMIN WITH MINERALS) TABS Take 1 tablet by mouth daily with lunch.     . nitroGLYCERIN (NITROSTAT) 0.4 MG SL tablet Place 1 tablet (0.4 mg total) under the tongue every 5 (five) minutes as needed for chest pain. 25 tablet 4  . oxyCODONE-acetaminophen (PERCOCET/ROXICET) 5-325 MG tablet Take 1 tablet by mouth every 6 (six) hours as needed for severe pain.    . OXYGEN Inhale 2 L into the lungs daily as needed (shortness of breath).     . pantoprazole  (PROTONIX) 40 MG tablet Take 40 mg by mouth daily.     Alexandra [name] Glycol-Propyl Glycol (SYSTANE OP) Place 1 drop into both eyes 2 (two) times daily.    . potassium chloride (K-DUR) 10 MEQ tablet Take 1 tablet (10 mEq total) by mouth daily. 30 tablet 6  . sodium chloride 0.9 % SOLN with milrinone 1 MG/ML SOLN 200 mcg/mL Inject 0.125 mcg/kg/min into the vein continuous.    Marland Kitchen spironolactone (ALDACTONE) 25 MG tablet Take 1 tablet (25 mg total) by mouth daily. 30 tablet 6  . traMADol (ULTRAM) 50 MG tablet Take 1 tablet (50 mg  total) by mouth every 6 (six) hours as needed for moderate pain. 60 tablet 3  . XARELTO 20 MG TABS tablet TAKE 1 TABLET BY MOUTH EVERY DAY 30 tablet 3   No current facility-administered medications for this encounter.    Allergies  Allergen Reactions  . Amiodarone Shortness Of Breath and Nausea And Vomiting  . Cymbalta [Duloxetine Hcl] Other (See Comments)    Depression  . Lyrica [Pregabalin] Other (See Comments)    Blurry vision      Social History   Social History  . Marital Status: Married    Spouse Name: N/A  . Number of Children: 5  . Years of Education: HS   Occupational History  . Retired    Social History Main Topics  . Smoking status: Never Smoker   . Smokeless tobacco: Never Used  . Alcohol Use: No  . Drug Use: No  . Sexual Activity: Not on file   Other Topics Concern  . Not on file   Social History Narrative   Lives at home alone.   Right-handed.   Two cups caffeine daily (coffee).      Family History  Problem Relation Age of Onset  . Diabetes Brother   . Alzheimer's disease Sister   . CVA Daughter     01/21/09  . Pancreatic cancer Father 50  . Pneumonia Mother 42    cause of death  . Heart attack Neg Hx   . Stroke Daughter   . Hypertension Daughter     Alexandra [name]:   03/13/15 1127  BP: 128/52  Pulse: 77  Weight: 161 lb (73.029 kg)  SpO2: 97%   Wt Readings from Last 3 Encounters:  03/13/15 161 lb (73.029 kg)  02/14/15  157 lb 9.6 oz (71.487 kg)  02/07/15 151 lb (68.493 kg)    PHYSICAL EXAM: General: In wheelchair, Daughter present.  HEENT: Normal, without mass or lesion. Neck: Supple, no bruits. JVP 6-7 cm. Carotids 2+. No thyromegaly or nodule. Heart: PMI nondisplaced, Regular rate no s4.  no murmur. ICD placement site well healing.  Lungs: Slightly diminished R base Abdomen: Soft, NT, ND, no HSM +BS x 4.  Extremities: No clubbing, cyanosis. No ankle edema.  LUE PICC site clean, dry, and intact. Left thigh musculature decreased compared to right.  Neuro: Alert and oriented X 3. Moves all extremities spontaneously.  Psych: Affect pleaseant  ASSESSMENT & PLAN:  1. Chronic Systolic Heart failure - Echo 10/02/14 EF 15% NYHA IIIB. On chronic milrinone 0.125 mcg.  - Now s/p St Jude CRT-D placement. 01/16/15 - Volume status stable on exam. - Overall improved. We discussed options of rechecking co-ox or just empirically stopping milrinone. We have decided to stop toda and see hw she feels. Leave PICC in for now - Start hydralazine 12.5 mg tid - Continue digoxin 0.0625 mg daily . No bb with low output. Has been off ACE/ARB due to CKD - Continue lasix 40 mg daily + 10 meq potassium daily .   - Continue corlanor 5 mg BID - Consider repeat echo in 4-5 months with med optimization and now with CRT-D 2. Recent PE/ DVT bilateral by [place] 08/20/2014 - Continue Xarelto 20 mg daily for now. (will be 6 months on 02/20/15). Would switch to apixaban 2.5 bid for long-term prevention of DVT 3. CKD Stage 3  - Stable on recent labs via [facility].  4. Hx of LBBB 5. Hypertension  - Stable. Starting hydralazine  6. Intolerance of amiodarone due to possible  acute lung toxicity 7. H/O Bacteremia -  - cornybacterium 2/2 cultures 11/29 . Completed antibiotic course with vancomycin. PICC replaced . Repeat blood cultures obtained 01/06/15 + staph aureus  - [place] 1/2 from [facility] in 1/17 +GPC. (post CRT-D upgrade) Treated with 1 week of  vanc. No febrile symptoms. PICC looks ok. Has f/u with ID next week, Hopefully we can pull PICC this week if tolerates milrinone wean.  Bensimhon, Daniel,MD 12:00 PM

## 2015-03-13 NOTE — Patient Instructions (Addendum)
Start Hydralazine 12.5mg  (1/2 tablet) three times daily.  Your provider has sent an order to [facility] to discontinue your Milrinone.  - [facility] will draw lab work next week (bmet cbc)  Follow up with Dr.Bensimhon in 4 weeks

## 2015-03-14 ENCOUNTER — Telehealth (HOSPITAL_COMMUNITY): Payer: Self-pay

## 2015-03-14 ENCOUNTER — Encounter: Payer: Self-pay | Admitting: Internal Medicine

## 2015-03-14 DIAGNOSIS — F329 Major depressive disorder, single episode, unspecified: Secondary | ICD-10-CM | POA: Diagnosis not present

## 2015-03-14 DIAGNOSIS — G609 Hereditary and idiopathic neuropathy, unspecified: Secondary | ICD-10-CM | POA: Diagnosis not present

## 2015-03-14 DIAGNOSIS — E669 Obesity, unspecified: Secondary | ICD-10-CM | POA: Diagnosis not present

## 2015-03-14 DIAGNOSIS — Z452 Encounter for adjustment and management of vascular access device: Secondary | ICD-10-CM | POA: Diagnosis not present

## 2015-03-14 DIAGNOSIS — I5023 Acute on chronic systolic (congestive) heart failure: Secondary | ICD-10-CM | POA: Diagnosis not present

## 2015-03-14 DIAGNOSIS — Z950 Presence of cardiac pacemaker: Secondary | ICD-10-CM | POA: Diagnosis not present

## 2015-03-14 DIAGNOSIS — Z48812 Encounter for surgical aftercare following surgery on the circulatory system: Secondary | ICD-10-CM | POA: Diagnosis not present

## 2015-03-14 DIAGNOSIS — I130 Hypertensive heart and chronic kidney disease with heart failure and stage 1 through stage 4 chronic kidney disease, or unspecified chronic kidney disease: Secondary | ICD-10-CM | POA: Diagnosis not present

## 2015-03-14 DIAGNOSIS — N183 Chronic kidney disease, stage 3 (moderate): Secondary | ICD-10-CM | POA: Diagnosis not present

## 2015-03-14 NOTE — Telephone Encounter (Signed)
VO given to [place] PT per Dr. Haroldine Laws to continue [facility] PT x 4-6 weeks for strengthening, gait, balance, and ADLs.

## 2015-03-16 ENCOUNTER — Other Ambulatory Visit: Payer: Self-pay | Admitting: Adult Health

## 2015-03-17 DIAGNOSIS — I5023 Acute on chronic systolic (congestive) heart failure: Secondary | ICD-10-CM | POA: Diagnosis not present

## 2015-03-17 DIAGNOSIS — Z950 Presence of cardiac pacemaker: Secondary | ICD-10-CM | POA: Diagnosis not present

## 2015-03-17 DIAGNOSIS — I130 Hypertensive heart and chronic kidney disease with heart failure and stage 1 through stage 4 chronic kidney disease, or unspecified chronic kidney disease: Secondary | ICD-10-CM | POA: Diagnosis not present

## 2015-03-17 DIAGNOSIS — Z452 Encounter for adjustment and management of vascular access device: Secondary | ICD-10-CM | POA: Diagnosis not present

## 2015-03-17 DIAGNOSIS — Z48812 Encounter for surgical aftercare following surgery on the circulatory system: Secondary | ICD-10-CM | POA: Diagnosis not present

## 2015-03-17 DIAGNOSIS — N183 Chronic kidney disease, stage 3 (moderate): Secondary | ICD-10-CM | POA: Diagnosis not present

## 2015-03-17 DIAGNOSIS — F329 Major depressive disorder, single episode, unspecified: Secondary | ICD-10-CM | POA: Diagnosis not present

## 2015-03-17 DIAGNOSIS — E669 Obesity, unspecified: Secondary | ICD-10-CM | POA: Diagnosis not present

## 2015-03-17 DIAGNOSIS — G609 Hereditary and idiopathic neuropathy, unspecified: Secondary | ICD-10-CM | POA: Diagnosis not present

## 2015-03-18 ENCOUNTER — Other Ambulatory Visit: Payer: Self-pay | Admitting: Adult Health

## 2015-03-18 DIAGNOSIS — Z950 Presence of cardiac pacemaker: Secondary | ICD-10-CM | POA: Diagnosis not present

## 2015-03-18 DIAGNOSIS — I5023 Acute on chronic systolic (congestive) heart failure: Secondary | ICD-10-CM | POA: Diagnosis not present

## 2015-03-18 DIAGNOSIS — E669 Obesity, unspecified: Secondary | ICD-10-CM | POA: Diagnosis not present

## 2015-03-18 DIAGNOSIS — N183 Chronic kidney disease, stage 3 (moderate): Secondary | ICD-10-CM | POA: Diagnosis not present

## 2015-03-18 DIAGNOSIS — I5043 Acute on chronic combined systolic (congestive) and diastolic (congestive) heart failure: Secondary | ICD-10-CM | POA: Diagnosis not present

## 2015-03-18 DIAGNOSIS — Z452 Encounter for adjustment and management of vascular access device: Secondary | ICD-10-CM | POA: Diagnosis not present

## 2015-03-18 DIAGNOSIS — G609 Hereditary and idiopathic neuropathy, unspecified: Secondary | ICD-10-CM | POA: Diagnosis not present

## 2015-03-18 DIAGNOSIS — I429 Cardiomyopathy, unspecified: Secondary | ICD-10-CM | POA: Diagnosis not present

## 2015-03-18 DIAGNOSIS — J189 Pneumonia, unspecified organism: Secondary | ICD-10-CM | POA: Diagnosis not present

## 2015-03-18 DIAGNOSIS — F329 Major depressive disorder, single episode, unspecified: Secondary | ICD-10-CM | POA: Diagnosis not present

## 2015-03-18 DIAGNOSIS — I130 Hypertensive heart and chronic kidney disease with heart failure and stage 1 through stage 4 chronic kidney disease, or unspecified chronic kidney disease: Secondary | ICD-10-CM | POA: Diagnosis not present

## 2015-03-18 DIAGNOSIS — Z48812 Encounter for surgical aftercare following surgery on the circulatory system: Secondary | ICD-10-CM | POA: Diagnosis not present

## 2015-03-18 DIAGNOSIS — I2699 Other pulmonary embolism without acute cor pulmonale: Secondary | ICD-10-CM | POA: Diagnosis not present

## 2015-03-19 DIAGNOSIS — I5043 Acute on chronic combined systolic (congestive) and diastolic (congestive) heart failure: Secondary | ICD-10-CM | POA: Diagnosis not present

## 2015-03-19 DIAGNOSIS — Z452 Encounter for adjustment and management of vascular access device: Secondary | ICD-10-CM | POA: Diagnosis not present

## 2015-03-19 DIAGNOSIS — I5023 Acute on chronic systolic (congestive) heart failure: Secondary | ICD-10-CM | POA: Diagnosis not present

## 2015-03-19 DIAGNOSIS — N183 Chronic kidney disease, stage 3 (moderate): Secondary | ICD-10-CM | POA: Diagnosis not present

## 2015-03-19 DIAGNOSIS — F329 Major depressive disorder, single episode, unspecified: Secondary | ICD-10-CM | POA: Diagnosis not present

## 2015-03-19 DIAGNOSIS — Z950 Presence of cardiac pacemaker: Secondary | ICD-10-CM | POA: Diagnosis not present

## 2015-03-19 DIAGNOSIS — E669 Obesity, unspecified: Secondary | ICD-10-CM | POA: Diagnosis not present

## 2015-03-19 DIAGNOSIS — I130 Hypertensive heart and chronic kidney disease with heart failure and stage 1 through stage 4 chronic kidney disease, or unspecified chronic kidney disease: Secondary | ICD-10-CM | POA: Diagnosis not present

## 2015-03-19 DIAGNOSIS — G609 Hereditary and idiopathic neuropathy, unspecified: Secondary | ICD-10-CM | POA: Diagnosis not present

## 2015-03-19 DIAGNOSIS — I2699 Other pulmonary embolism without acute cor pulmonale: Secondary | ICD-10-CM | POA: Diagnosis not present

## 2015-03-19 DIAGNOSIS — Z5181 Encounter for therapeutic drug level monitoring: Secondary | ICD-10-CM | POA: Diagnosis not present

## 2015-03-19 DIAGNOSIS — J189 Pneumonia, unspecified organism: Secondary | ICD-10-CM | POA: Diagnosis not present

## 2015-03-20 ENCOUNTER — Telehealth (HOSPITAL_COMMUNITY): Payer: Self-pay | Admitting: *Deleted

## 2015-03-20 DIAGNOSIS — G609 Hereditary and idiopathic neuropathy, unspecified: Secondary | ICD-10-CM | POA: Diagnosis not present

## 2015-03-20 DIAGNOSIS — I130 Hypertensive heart and chronic kidney disease with heart failure and stage 1 through stage 4 chronic kidney disease, or unspecified chronic kidney disease: Secondary | ICD-10-CM | POA: Diagnosis not present

## 2015-03-20 DIAGNOSIS — E669 Obesity, unspecified: Secondary | ICD-10-CM | POA: Diagnosis not present

## 2015-03-20 DIAGNOSIS — F329 Major depressive disorder, single episode, unspecified: Secondary | ICD-10-CM | POA: Diagnosis not present

## 2015-03-20 DIAGNOSIS — N183 Chronic kidney disease, stage 3 (moderate): Secondary | ICD-10-CM | POA: Diagnosis not present

## 2015-03-20 DIAGNOSIS — Z5181 Encounter for therapeutic drug level monitoring: Secondary | ICD-10-CM | POA: Diagnosis not present

## 2015-03-20 DIAGNOSIS — I5023 Acute on chronic systolic (congestive) heart failure: Secondary | ICD-10-CM | POA: Diagnosis not present

## 2015-03-20 DIAGNOSIS — Z452 Encounter for adjustment and management of vascular access device: Secondary | ICD-10-CM | POA: Diagnosis not present

## 2015-03-20 DIAGNOSIS — Z950 Presence of cardiac pacemaker: Secondary | ICD-10-CM | POA: Diagnosis not present

## 2015-03-20 NOTE — Telephone Encounter (Signed)
Alexandra [name] is seeing pt today and wanted to know if were planning to pull PICC line.  Pt states she feels the same as when she was on the milrinone, she does not feel any worse off of it and wished to have line out.  Discussed w/Dr Bensimhon he states ok to pull PICC, Alexandra [name] is aware and will pull at her next visit.  Also Alexandra [name] states pt is requesting [facility] remove her oxygen as she no longer uses it.  Per Dr Haroldine Laws if pt is not wearing ok to d/c, order faxed to [facility] at [phone]

## 2015-03-21 ENCOUNTER — Telehealth (HOSPITAL_COMMUNITY): Payer: Self-pay

## 2015-03-21 DIAGNOSIS — N183 Chronic kidney disease, stage 3 (moderate): Secondary | ICD-10-CM | POA: Diagnosis not present

## 2015-03-21 DIAGNOSIS — I5023 Acute on chronic systolic (congestive) heart failure: Secondary | ICD-10-CM | POA: Diagnosis not present

## 2015-03-21 DIAGNOSIS — I130 Hypertensive heart and chronic kidney disease with heart failure and stage 1 through stage 4 chronic kidney disease, or unspecified chronic kidney disease: Secondary | ICD-10-CM | POA: Diagnosis not present

## 2015-03-21 DIAGNOSIS — Z452 Encounter for adjustment and management of vascular access device: Secondary | ICD-10-CM | POA: Diagnosis not present

## 2015-03-21 DIAGNOSIS — Z950 Presence of cardiac pacemaker: Secondary | ICD-10-CM | POA: Diagnosis not present

## 2015-03-21 DIAGNOSIS — F329 Major depressive disorder, single episode, unspecified: Secondary | ICD-10-CM | POA: Diagnosis not present

## 2015-03-21 DIAGNOSIS — G609 Hereditary and idiopathic neuropathy, unspecified: Secondary | ICD-10-CM | POA: Diagnosis not present

## 2015-03-21 DIAGNOSIS — Z5181 Encounter for therapeutic drug level monitoring: Secondary | ICD-10-CM | POA: Diagnosis not present

## 2015-03-21 DIAGNOSIS — E669 Obesity, unspecified: Secondary | ICD-10-CM | POA: Diagnosis not present

## 2015-03-21 NOTE — Telephone Encounter (Signed)
Patient endorses feeling tired since stopping milrinone drip at home. Per yesterday's clinic phone note, patient had reported feeling fine as if she were on milrinone  When clarifying this with the patient she states" well I never really felt great on milrinone either, and ive been tired and not feeling well, but I don't know if this is the same way I felt bad on milrinone". BP 129/57 HR 89 Weight stable Afebrile Per Oda Kilts PA, advised she may restart milrinone or come to ED.  Patient prefers to wait and see how she feels over the weekend, and if her symptoms worsen she will come to ED to be evaluated.  Will put her on Monday lab schedule to check co-ox otherwise.  [name]

## 2015-03-23 ENCOUNTER — Telehealth: Payer: Self-pay | Admitting: Physician Assistant

## 2015-03-23 NOTE — Telephone Encounter (Signed)
The patient called wondering where to go for her labs.  Tarri Fuller PAC

## 2015-03-24 ENCOUNTER — Encounter: Payer: Self-pay | Admitting: Infectious Diseases

## 2015-03-24 ENCOUNTER — Ambulatory Visit (HOSPITAL_COMMUNITY)
Admission: RE | Admit: 2015-03-24 | Discharge: 2015-03-24 | Disposition: A | Discharge status: Home / Self Care | Payer: Commercial Managed Care - HMO | Source: Ambulatory Visit | Attending: Cardiology | Admitting: Cardiology

## 2015-03-24 ENCOUNTER — Ambulatory Visit (INDEPENDENT_AMBULATORY_CARE_PROVIDER_SITE_OTHER): Payer: Commercial Managed Care - HMO | Admitting: Infectious Diseases

## 2015-03-24 VITALS — BP 105/62 | HR 79 | Temp 98.1°F

## 2015-03-24 DIAGNOSIS — I5022 Chronic systolic (congestive) heart failure: Secondary | ICD-10-CM | POA: Diagnosis not present

## 2015-03-24 DIAGNOSIS — I5023 Acute on chronic systolic (congestive) heart failure: Secondary | ICD-10-CM | POA: Diagnosis not present

## 2015-03-24 DIAGNOSIS — A369 Diphtheria, unspecified: Secondary | ICD-10-CM

## 2015-03-24 LAB — CARBOXYHEMOGLOBIN
CARBOXYHEMOGLOBIN: 1 % (ref 0.5–1.5)
METHEMOGLOBIN: 0.9 % (ref 0.0–1.5)
O2 SAT: 50.3 %
TOTAL HEMOGLOBIN: 11.6 g/dL — AB (ref 12.0–16.0)

## 2015-03-24 NOTE — Assessment & Plan Note (Signed)
She is doing well. Will recheck for clearance.  It would seem unlikely as she is asx and doing well. Her recent TTE shows a significant improvement in her EF.  Will call her if any abn in her BCx Otherwise she will rtc prn

## 2015-03-24 NOTE — Progress Notes (Signed)
   Subjective:    Patient ID: Alexandra [name], female    DOB: 1927/05/14, 82 y.o.   MRN: [id number]  HPI 82 yo F with hx home IV milrinone, Class IV CHF (EF 15%, repeated 02-2015 EF 40-45%) and CKD 3. Adm to hospital on 12-1 with fevers at home for ~ 1 week. Her outpt BCx grew diphtheroids in 2/2 BCx (11-29). She had temp 101 in hosptial and  was started on vanco/zosyn.  She her PIC was removed and she received 1 week of vancomycin. She had a new pic placed and returned home on 12-24-14. She cxompleted her anbx on 12-11.  She had pacer placed 12-29. Wound healed well.  Has been off milrinone for last 10 days. Has been feeling very tired. Is to have her coags checked today.  No problems with her PIC- one port is hard to clean and hard to flush.  No fevers or chills, no erythema or d/c at site.   Review of Systems  HENT: Negative for nosebleeds.   Gastrointestinal: Negative for diarrhea, constipation and blood in stool.  Genitourinary: Negative for difficulty urinating.  occas gum bleeds- spitting up blood. Was seen by ENT.  Please see HPI. 12 point ROS o/w (-)     Objective:   Physical Exam  Constitutional: She appears well-developed and well-nourished.  HENT:  Mouth/Throat: No oropharyngeal exudate.  Eyes: EOM are normal. Pupils are equal, round, and reactive to light.  Neck: Neck supple.  Cardiovascular: Normal rate, regular rhythm and normal heart sounds.   Pulmonary/Chest: Effort normal and breath sounds normal.    Abdominal: Soft. Bowel sounds are normal. There is no tenderness. There is no rebound.  Musculoskeletal: She exhibits no edema.       Arms: Lymphadenopathy:    She has no cervical adenopathy.       Assessment & Plan:

## 2015-03-25 DIAGNOSIS — Z452 Encounter for adjustment and management of vascular access device: Secondary | ICD-10-CM | POA: Diagnosis not present

## 2015-03-25 DIAGNOSIS — I5023 Acute on chronic systolic (congestive) heart failure: Secondary | ICD-10-CM | POA: Diagnosis not present

## 2015-03-25 DIAGNOSIS — N183 Chronic kidney disease, stage 3 (moderate): Secondary | ICD-10-CM | POA: Diagnosis not present

## 2015-03-25 DIAGNOSIS — F329 Major depressive disorder, single episode, unspecified: Secondary | ICD-10-CM | POA: Diagnosis not present

## 2015-03-25 DIAGNOSIS — I130 Hypertensive heart and chronic kidney disease with heart failure and stage 1 through stage 4 chronic kidney disease, or unspecified chronic kidney disease: Secondary | ICD-10-CM | POA: Diagnosis not present

## 2015-03-25 DIAGNOSIS — Z5181 Encounter for therapeutic drug level monitoring: Secondary | ICD-10-CM | POA: Diagnosis not present

## 2015-03-25 DIAGNOSIS — Z950 Presence of cardiac pacemaker: Secondary | ICD-10-CM | POA: Diagnosis not present

## 2015-03-25 DIAGNOSIS — G609 Hereditary and idiopathic neuropathy, unspecified: Secondary | ICD-10-CM | POA: Diagnosis not present

## 2015-03-25 DIAGNOSIS — E669 Obesity, unspecified: Secondary | ICD-10-CM | POA: Diagnosis not present

## 2015-03-26 DIAGNOSIS — E669 Obesity, unspecified: Secondary | ICD-10-CM | POA: Diagnosis not present

## 2015-03-26 DIAGNOSIS — G609 Hereditary and idiopathic neuropathy, unspecified: Secondary | ICD-10-CM | POA: Diagnosis not present

## 2015-03-26 DIAGNOSIS — Z452 Encounter for adjustment and management of vascular access device: Secondary | ICD-10-CM | POA: Diagnosis not present

## 2015-03-26 DIAGNOSIS — Z5181 Encounter for therapeutic drug level monitoring: Secondary | ICD-10-CM | POA: Diagnosis not present

## 2015-03-26 DIAGNOSIS — F329 Major depressive disorder, single episode, unspecified: Secondary | ICD-10-CM | POA: Diagnosis not present

## 2015-03-26 DIAGNOSIS — Z950 Presence of cardiac pacemaker: Secondary | ICD-10-CM | POA: Diagnosis not present

## 2015-03-26 DIAGNOSIS — I5023 Acute on chronic systolic (congestive) heart failure: Secondary | ICD-10-CM | POA: Diagnosis not present

## 2015-03-26 DIAGNOSIS — N183 Chronic kidney disease, stage 3 (moderate): Secondary | ICD-10-CM | POA: Diagnosis not present

## 2015-03-26 DIAGNOSIS — I130 Hypertensive heart and chronic kidney disease with heart failure and stage 1 through stage 4 chronic kidney disease, or unspecified chronic kidney disease: Secondary | ICD-10-CM | POA: Diagnosis not present

## 2015-03-27 DIAGNOSIS — G609 Hereditary and idiopathic neuropathy, unspecified: Secondary | ICD-10-CM | POA: Diagnosis not present

## 2015-03-27 DIAGNOSIS — N183 Chronic kidney disease, stage 3 (moderate): Secondary | ICD-10-CM | POA: Diagnosis not present

## 2015-03-27 DIAGNOSIS — E669 Obesity, unspecified: Secondary | ICD-10-CM | POA: Diagnosis not present

## 2015-03-27 DIAGNOSIS — Z452 Encounter for adjustment and management of vascular access device: Secondary | ICD-10-CM | POA: Diagnosis not present

## 2015-03-27 DIAGNOSIS — F329 Major depressive disorder, single episode, unspecified: Secondary | ICD-10-CM | POA: Diagnosis not present

## 2015-03-27 DIAGNOSIS — Z950 Presence of cardiac pacemaker: Secondary | ICD-10-CM | POA: Diagnosis not present

## 2015-03-27 DIAGNOSIS — Z5181 Encounter for therapeutic drug level monitoring: Secondary | ICD-10-CM | POA: Diagnosis not present

## 2015-03-27 DIAGNOSIS — I5023 Acute on chronic systolic (congestive) heart failure: Secondary | ICD-10-CM | POA: Diagnosis not present

## 2015-03-27 DIAGNOSIS — I130 Hypertensive heart and chronic kidney disease with heart failure and stage 1 through stage 4 chronic kidney disease, or unspecified chronic kidney disease: Secondary | ICD-10-CM | POA: Diagnosis not present

## 2015-03-28 ENCOUNTER — Other Ambulatory Visit (HOSPITAL_COMMUNITY): Payer: Self-pay | Admitting: Internal Medicine

## 2015-03-28 DIAGNOSIS — I5023 Acute on chronic systolic (congestive) heart failure: Secondary | ICD-10-CM | POA: Diagnosis not present

## 2015-03-28 DIAGNOSIS — Z950 Presence of cardiac pacemaker: Secondary | ICD-10-CM | POA: Diagnosis not present

## 2015-03-28 DIAGNOSIS — G609 Hereditary and idiopathic neuropathy, unspecified: Secondary | ICD-10-CM | POA: Diagnosis not present

## 2015-03-28 DIAGNOSIS — Z5181 Encounter for therapeutic drug level monitoring: Secondary | ICD-10-CM | POA: Diagnosis not present

## 2015-03-28 DIAGNOSIS — I130 Hypertensive heart and chronic kidney disease with heart failure and stage 1 through stage 4 chronic kidney disease, or unspecified chronic kidney disease: Secondary | ICD-10-CM | POA: Diagnosis not present

## 2015-03-28 DIAGNOSIS — F329 Major depressive disorder, single episode, unspecified: Secondary | ICD-10-CM | POA: Diagnosis not present

## 2015-03-28 DIAGNOSIS — Z452 Encounter for adjustment and management of vascular access device: Secondary | ICD-10-CM | POA: Diagnosis not present

## 2015-03-28 DIAGNOSIS — N183 Chronic kidney disease, stage 3 (moderate): Secondary | ICD-10-CM | POA: Diagnosis not present

## 2015-03-28 DIAGNOSIS — E669 Obesity, unspecified: Secondary | ICD-10-CM | POA: Diagnosis not present

## 2015-03-30 LAB — CULTURE, BLOOD (SINGLE)
Organism ID, Bacteria: NO GROWTH
Organism ID, Bacteria: NO GROWTH

## 2015-03-31 ENCOUNTER — Other Ambulatory Visit (HOSPITAL_COMMUNITY): Payer: Commercial Managed Care - HMO

## 2015-03-31 DIAGNOSIS — Z950 Presence of cardiac pacemaker: Secondary | ICD-10-CM | POA: Diagnosis not present

## 2015-03-31 DIAGNOSIS — Z5181 Encounter for therapeutic drug level monitoring: Secondary | ICD-10-CM | POA: Diagnosis not present

## 2015-03-31 DIAGNOSIS — G609 Hereditary and idiopathic neuropathy, unspecified: Secondary | ICD-10-CM | POA: Diagnosis not present

## 2015-03-31 DIAGNOSIS — Z452 Encounter for adjustment and management of vascular access device: Secondary | ICD-10-CM | POA: Diagnosis not present

## 2015-03-31 DIAGNOSIS — F329 Major depressive disorder, single episode, unspecified: Secondary | ICD-10-CM | POA: Diagnosis not present

## 2015-03-31 DIAGNOSIS — I130 Hypertensive heart and chronic kidney disease with heart failure and stage 1 through stage 4 chronic kidney disease, or unspecified chronic kidney disease: Secondary | ICD-10-CM | POA: Diagnosis not present

## 2015-03-31 DIAGNOSIS — I5023 Acute on chronic systolic (congestive) heart failure: Secondary | ICD-10-CM | POA: Diagnosis not present

## 2015-03-31 DIAGNOSIS — N183 Chronic kidney disease, stage 3 (moderate): Secondary | ICD-10-CM | POA: Diagnosis not present

## 2015-03-31 DIAGNOSIS — E669 Obesity, unspecified: Secondary | ICD-10-CM | POA: Diagnosis not present

## 2015-04-01 DIAGNOSIS — Z950 Presence of cardiac pacemaker: Secondary | ICD-10-CM | POA: Diagnosis not present

## 2015-04-01 DIAGNOSIS — I130 Hypertensive heart and chronic kidney disease with heart failure and stage 1 through stage 4 chronic kidney disease, or unspecified chronic kidney disease: Secondary | ICD-10-CM | POA: Diagnosis not present

## 2015-04-01 DIAGNOSIS — Z452 Encounter for adjustment and management of vascular access device: Secondary | ICD-10-CM | POA: Diagnosis not present

## 2015-04-01 DIAGNOSIS — G609 Hereditary and idiopathic neuropathy, unspecified: Secondary | ICD-10-CM | POA: Diagnosis not present

## 2015-04-01 DIAGNOSIS — F329 Major depressive disorder, single episode, unspecified: Secondary | ICD-10-CM | POA: Diagnosis not present

## 2015-04-01 DIAGNOSIS — E669 Obesity, unspecified: Secondary | ICD-10-CM | POA: Diagnosis not present

## 2015-04-01 DIAGNOSIS — Z5181 Encounter for therapeutic drug level monitoring: Secondary | ICD-10-CM | POA: Diagnosis not present

## 2015-04-01 DIAGNOSIS — N183 Chronic kidney disease, stage 3 (moderate): Secondary | ICD-10-CM | POA: Diagnosis not present

## 2015-04-01 DIAGNOSIS — I5023 Acute on chronic systolic (congestive) heart failure: Secondary | ICD-10-CM | POA: Diagnosis not present

## 2015-04-02 DIAGNOSIS — Z950 Presence of cardiac pacemaker: Secondary | ICD-10-CM | POA: Diagnosis not present

## 2015-04-02 DIAGNOSIS — N183 Chronic kidney disease, stage 3 (moderate): Secondary | ICD-10-CM | POA: Diagnosis not present

## 2015-04-02 DIAGNOSIS — F329 Major depressive disorder, single episode, unspecified: Secondary | ICD-10-CM | POA: Diagnosis not present

## 2015-04-02 DIAGNOSIS — I5023 Acute on chronic systolic (congestive) heart failure: Secondary | ICD-10-CM | POA: Diagnosis not present

## 2015-04-02 DIAGNOSIS — Z452 Encounter for adjustment and management of vascular access device: Secondary | ICD-10-CM | POA: Diagnosis not present

## 2015-04-02 DIAGNOSIS — Z5181 Encounter for therapeutic drug level monitoring: Secondary | ICD-10-CM | POA: Diagnosis not present

## 2015-04-02 DIAGNOSIS — I130 Hypertensive heart and chronic kidney disease with heart failure and stage 1 through stage 4 chronic kidney disease, or unspecified chronic kidney disease: Secondary | ICD-10-CM | POA: Diagnosis not present

## 2015-04-02 DIAGNOSIS — G609 Hereditary and idiopathic neuropathy, unspecified: Secondary | ICD-10-CM | POA: Diagnosis not present

## 2015-04-02 DIAGNOSIS — E669 Obesity, unspecified: Secondary | ICD-10-CM | POA: Diagnosis not present

## 2015-04-03 DIAGNOSIS — I130 Hypertensive heart and chronic kidney disease with heart failure and stage 1 through stage 4 chronic kidney disease, or unspecified chronic kidney disease: Secondary | ICD-10-CM | POA: Diagnosis not present

## 2015-04-03 DIAGNOSIS — Z950 Presence of cardiac pacemaker: Secondary | ICD-10-CM | POA: Diagnosis not present

## 2015-04-03 DIAGNOSIS — N183 Chronic kidney disease, stage 3 (moderate): Secondary | ICD-10-CM | POA: Diagnosis not present

## 2015-04-03 DIAGNOSIS — G609 Hereditary and idiopathic neuropathy, unspecified: Secondary | ICD-10-CM | POA: Diagnosis not present

## 2015-04-03 DIAGNOSIS — E669 Obesity, unspecified: Secondary | ICD-10-CM | POA: Diagnosis not present

## 2015-04-03 DIAGNOSIS — Z5181 Encounter for therapeutic drug level monitoring: Secondary | ICD-10-CM | POA: Diagnosis not present

## 2015-04-03 DIAGNOSIS — I5023 Acute on chronic systolic (congestive) heart failure: Secondary | ICD-10-CM | POA: Diagnosis not present

## 2015-04-03 DIAGNOSIS — Z452 Encounter for adjustment and management of vascular access device: Secondary | ICD-10-CM | POA: Diagnosis not present

## 2015-04-03 DIAGNOSIS — F329 Major depressive disorder, single episode, unspecified: Secondary | ICD-10-CM | POA: Diagnosis not present

## 2015-04-04 DIAGNOSIS — I130 Hypertensive heart and chronic kidney disease with heart failure and stage 1 through stage 4 chronic kidney disease, or unspecified chronic kidney disease: Secondary | ICD-10-CM | POA: Diagnosis not present

## 2015-04-04 DIAGNOSIS — Z5181 Encounter for therapeutic drug level monitoring: Secondary | ICD-10-CM | POA: Diagnosis not present

## 2015-04-04 DIAGNOSIS — G609 Hereditary and idiopathic neuropathy, unspecified: Secondary | ICD-10-CM | POA: Diagnosis not present

## 2015-04-04 DIAGNOSIS — Z452 Encounter for adjustment and management of vascular access device: Secondary | ICD-10-CM | POA: Diagnosis not present

## 2015-04-04 DIAGNOSIS — Z950 Presence of cardiac pacemaker: Secondary | ICD-10-CM | POA: Diagnosis not present

## 2015-04-04 DIAGNOSIS — E669 Obesity, unspecified: Secondary | ICD-10-CM | POA: Diagnosis not present

## 2015-04-04 DIAGNOSIS — I5023 Acute on chronic systolic (congestive) heart failure: Secondary | ICD-10-CM | POA: Diagnosis not present

## 2015-04-04 DIAGNOSIS — F329 Major depressive disorder, single episode, unspecified: Secondary | ICD-10-CM | POA: Diagnosis not present

## 2015-04-04 DIAGNOSIS — N183 Chronic kidney disease, stage 3 (moderate): Secondary | ICD-10-CM | POA: Diagnosis not present

## 2015-04-06 ENCOUNTER — Other Ambulatory Visit (HOSPITAL_COMMUNITY): Payer: Self-pay | Admitting: Internal Medicine

## 2015-04-07 DIAGNOSIS — Z950 Presence of cardiac pacemaker: Secondary | ICD-10-CM | POA: Diagnosis not present

## 2015-04-07 DIAGNOSIS — I130 Hypertensive heart and chronic kidney disease with heart failure and stage 1 through stage 4 chronic kidney disease, or unspecified chronic kidney disease: Secondary | ICD-10-CM | POA: Diagnosis not present

## 2015-04-07 DIAGNOSIS — N183 Chronic kidney disease, stage 3 (moderate): Secondary | ICD-10-CM | POA: Diagnosis not present

## 2015-04-07 DIAGNOSIS — I5023 Acute on chronic systolic (congestive) heart failure: Secondary | ICD-10-CM | POA: Diagnosis not present

## 2015-04-07 DIAGNOSIS — E669 Obesity, unspecified: Secondary | ICD-10-CM | POA: Diagnosis not present

## 2015-04-07 DIAGNOSIS — F329 Major depressive disorder, single episode, unspecified: Secondary | ICD-10-CM | POA: Diagnosis not present

## 2015-04-07 DIAGNOSIS — G609 Hereditary and idiopathic neuropathy, unspecified: Secondary | ICD-10-CM | POA: Diagnosis not present

## 2015-04-07 DIAGNOSIS — Z5181 Encounter for therapeutic drug level monitoring: Secondary | ICD-10-CM | POA: Diagnosis not present

## 2015-04-07 DIAGNOSIS — Z452 Encounter for adjustment and management of vascular access device: Secondary | ICD-10-CM | POA: Diagnosis not present

## 2015-04-08 DIAGNOSIS — N183 Chronic kidney disease, stage 3 (moderate): Secondary | ICD-10-CM | POA: Diagnosis not present

## 2015-04-08 DIAGNOSIS — I130 Hypertensive heart and chronic kidney disease with heart failure and stage 1 through stage 4 chronic kidney disease, or unspecified chronic kidney disease: Secondary | ICD-10-CM | POA: Diagnosis not present

## 2015-04-08 DIAGNOSIS — I5023 Acute on chronic systolic (congestive) heart failure: Secondary | ICD-10-CM | POA: Diagnosis not present

## 2015-04-08 DIAGNOSIS — Z950 Presence of cardiac pacemaker: Secondary | ICD-10-CM | POA: Diagnosis not present

## 2015-04-08 DIAGNOSIS — G609 Hereditary and idiopathic neuropathy, unspecified: Secondary | ICD-10-CM | POA: Diagnosis not present

## 2015-04-08 DIAGNOSIS — F329 Major depressive disorder, single episode, unspecified: Secondary | ICD-10-CM | POA: Diagnosis not present

## 2015-04-08 DIAGNOSIS — Z5181 Encounter for therapeutic drug level monitoring: Secondary | ICD-10-CM | POA: Diagnosis not present

## 2015-04-08 DIAGNOSIS — Z452 Encounter for adjustment and management of vascular access device: Secondary | ICD-10-CM | POA: Diagnosis not present

## 2015-04-08 DIAGNOSIS — E669 Obesity, unspecified: Secondary | ICD-10-CM | POA: Diagnosis not present

## 2015-04-09 DIAGNOSIS — I130 Hypertensive heart and chronic kidney disease with heart failure and stage 1 through stage 4 chronic kidney disease, or unspecified chronic kidney disease: Secondary | ICD-10-CM | POA: Diagnosis not present

## 2015-04-09 DIAGNOSIS — Z452 Encounter for adjustment and management of vascular access device: Secondary | ICD-10-CM | POA: Diagnosis not present

## 2015-04-09 DIAGNOSIS — I5023 Acute on chronic systolic (congestive) heart failure: Secondary | ICD-10-CM | POA: Diagnosis not present

## 2015-04-09 DIAGNOSIS — Z5181 Encounter for therapeutic drug level monitoring: Secondary | ICD-10-CM | POA: Diagnosis not present

## 2015-04-09 DIAGNOSIS — F329 Major depressive disorder, single episode, unspecified: Secondary | ICD-10-CM | POA: Diagnosis not present

## 2015-04-09 DIAGNOSIS — Z950 Presence of cardiac pacemaker: Secondary | ICD-10-CM | POA: Diagnosis not present

## 2015-04-09 DIAGNOSIS — N183 Chronic kidney disease, stage 3 (moderate): Secondary | ICD-10-CM | POA: Diagnosis not present

## 2015-04-09 DIAGNOSIS — E669 Obesity, unspecified: Secondary | ICD-10-CM | POA: Diagnosis not present

## 2015-04-09 DIAGNOSIS — G609 Hereditary and idiopathic neuropathy, unspecified: Secondary | ICD-10-CM | POA: Diagnosis not present

## 2015-04-10 DIAGNOSIS — E669 Obesity, unspecified: Secondary | ICD-10-CM | POA: Diagnosis not present

## 2015-04-10 DIAGNOSIS — I130 Hypertensive heart and chronic kidney disease with heart failure and stage 1 through stage 4 chronic kidney disease, or unspecified chronic kidney disease: Secondary | ICD-10-CM | POA: Diagnosis not present

## 2015-04-10 DIAGNOSIS — Z950 Presence of cardiac pacemaker: Secondary | ICD-10-CM | POA: Diagnosis not present

## 2015-04-10 DIAGNOSIS — Z452 Encounter for adjustment and management of vascular access device: Secondary | ICD-10-CM | POA: Diagnosis not present

## 2015-04-10 DIAGNOSIS — I5023 Acute on chronic systolic (congestive) heart failure: Secondary | ICD-10-CM | POA: Diagnosis not present

## 2015-04-10 DIAGNOSIS — Z5181 Encounter for therapeutic drug level monitoring: Secondary | ICD-10-CM | POA: Diagnosis not present

## 2015-04-10 DIAGNOSIS — F329 Major depressive disorder, single episode, unspecified: Secondary | ICD-10-CM | POA: Diagnosis not present

## 2015-04-10 DIAGNOSIS — N183 Chronic kidney disease, stage 3 (moderate): Secondary | ICD-10-CM | POA: Diagnosis not present

## 2015-04-10 DIAGNOSIS — G609 Hereditary and idiopathic neuropathy, unspecified: Secondary | ICD-10-CM | POA: Diagnosis not present

## 2015-04-11 ENCOUNTER — Telehealth (HOSPITAL_COMMUNITY): Payer: Self-pay | Admitting: *Deleted

## 2015-04-11 NOTE — Telephone Encounter (Signed)
Alexandra [name] left a VM yesterday needing VO to extend PT for 4 weeks, called back and left her a VO ok to extend call back for more questions

## 2015-04-14 ENCOUNTER — Encounter (HOSPITAL_COMMUNITY): Payer: Commercial Managed Care - HMO | Admitting: Internal Medicine

## 2015-04-14 DIAGNOSIS — Z950 Presence of cardiac pacemaker: Secondary | ICD-10-CM | POA: Diagnosis not present

## 2015-04-14 DIAGNOSIS — N183 Chronic kidney disease, stage 3 (moderate): Secondary | ICD-10-CM | POA: Diagnosis not present

## 2015-04-14 DIAGNOSIS — Z452 Encounter for adjustment and management of vascular access device: Secondary | ICD-10-CM | POA: Diagnosis not present

## 2015-04-14 DIAGNOSIS — F329 Major depressive disorder, single episode, unspecified: Secondary | ICD-10-CM | POA: Diagnosis not present

## 2015-04-14 DIAGNOSIS — M8580 Other specified disorders of bone density and structure, unspecified site: Secondary | ICD-10-CM | POA: Diagnosis not present

## 2015-04-14 DIAGNOSIS — I130 Hypertensive heart and chronic kidney disease with heart failure and stage 1 through stage 4 chronic kidney disease, or unspecified chronic kidney disease: Secondary | ICD-10-CM | POA: Diagnosis not present

## 2015-04-14 DIAGNOSIS — I10 Essential (primary) hypertension: Secondary | ICD-10-CM | POA: Diagnosis not present

## 2015-04-14 DIAGNOSIS — Z5181 Encounter for therapeutic drug level monitoring: Secondary | ICD-10-CM | POA: Diagnosis not present

## 2015-04-14 DIAGNOSIS — E559 Vitamin D deficiency, unspecified: Secondary | ICD-10-CM | POA: Diagnosis not present

## 2015-04-14 DIAGNOSIS — E039 Hypothyroidism, unspecified: Secondary | ICD-10-CM | POA: Diagnosis not present

## 2015-04-14 DIAGNOSIS — Z0000 Encounter for general adult medical examination without abnormal findings: Secondary | ICD-10-CM | POA: Diagnosis not present

## 2015-04-14 DIAGNOSIS — G609 Hereditary and idiopathic neuropathy, unspecified: Secondary | ICD-10-CM | POA: Diagnosis not present

## 2015-04-14 DIAGNOSIS — E669 Obesity, unspecified: Secondary | ICD-10-CM | POA: Diagnosis not present

## 2015-04-14 DIAGNOSIS — I5023 Acute on chronic systolic (congestive) heart failure: Secondary | ICD-10-CM | POA: Diagnosis not present

## 2015-04-14 DIAGNOSIS — D649 Anemia, unspecified: Secondary | ICD-10-CM | POA: Diagnosis not present

## 2015-04-15 DIAGNOSIS — G609 Hereditary and idiopathic neuropathy, unspecified: Secondary | ICD-10-CM | POA: Diagnosis not present

## 2015-04-15 DIAGNOSIS — I130 Hypertensive heart and chronic kidney disease with heart failure and stage 1 through stage 4 chronic kidney disease, or unspecified chronic kidney disease: Secondary | ICD-10-CM | POA: Diagnosis not present

## 2015-04-15 DIAGNOSIS — E669 Obesity, unspecified: Secondary | ICD-10-CM | POA: Diagnosis not present

## 2015-04-15 DIAGNOSIS — N183 Chronic kidney disease, stage 3 (moderate): Secondary | ICD-10-CM | POA: Diagnosis not present

## 2015-04-15 DIAGNOSIS — Z452 Encounter for adjustment and management of vascular access device: Secondary | ICD-10-CM | POA: Diagnosis not present

## 2015-04-15 DIAGNOSIS — Z5181 Encounter for therapeutic drug level monitoring: Secondary | ICD-10-CM | POA: Diagnosis not present

## 2015-04-15 DIAGNOSIS — Z950 Presence of cardiac pacemaker: Secondary | ICD-10-CM | POA: Diagnosis not present

## 2015-04-15 DIAGNOSIS — F329 Major depressive disorder, single episode, unspecified: Secondary | ICD-10-CM | POA: Diagnosis not present

## 2015-04-15 DIAGNOSIS — I5023 Acute on chronic systolic (congestive) heart failure: Secondary | ICD-10-CM | POA: Diagnosis not present

## 2015-04-16 ENCOUNTER — Encounter (HOSPITAL_COMMUNITY): Payer: Commercial Managed Care - HMO | Admitting: Internal Medicine

## 2015-04-17 ENCOUNTER — Telehealth (HOSPITAL_COMMUNITY): Payer: Self-pay

## 2015-04-17 DIAGNOSIS — E669 Obesity, unspecified: Secondary | ICD-10-CM | POA: Diagnosis not present

## 2015-04-17 DIAGNOSIS — I5023 Acute on chronic systolic (congestive) heart failure: Secondary | ICD-10-CM | POA: Diagnosis not present

## 2015-04-17 DIAGNOSIS — F329 Major depressive disorder, single episode, unspecified: Secondary | ICD-10-CM | POA: Diagnosis not present

## 2015-04-17 DIAGNOSIS — I130 Hypertensive heart and chronic kidney disease with heart failure and stage 1 through stage 4 chronic kidney disease, or unspecified chronic kidney disease: Secondary | ICD-10-CM | POA: Diagnosis not present

## 2015-04-17 DIAGNOSIS — G609 Hereditary and idiopathic neuropathy, unspecified: Secondary | ICD-10-CM | POA: Diagnosis not present

## 2015-04-17 DIAGNOSIS — Z950 Presence of cardiac pacemaker: Secondary | ICD-10-CM | POA: Diagnosis not present

## 2015-04-17 DIAGNOSIS — Z452 Encounter for adjustment and management of vascular access device: Secondary | ICD-10-CM | POA: Diagnosis not present

## 2015-04-17 DIAGNOSIS — N183 Chronic kidney disease, stage 3 (moderate): Secondary | ICD-10-CM | POA: Diagnosis not present

## 2015-04-17 DIAGNOSIS — Z5181 Encounter for therapeutic drug level monitoring: Secondary | ICD-10-CM | POA: Diagnosis not present

## 2015-04-17 NOTE — Telephone Encounter (Signed)
[place] OT with [facility] left VM on CHF triage line to get VO to continue [facility] OT and [place] aid. VO given on secure VM per [place] to continue.  [name]

## 2015-04-18 ENCOUNTER — Other Ambulatory Visit (HOSPITAL_COMMUNITY): Payer: Self-pay | Admitting: Internal Medicine

## 2015-04-18 ENCOUNTER — Encounter: Payer: Commercial Managed Care - HMO | Admitting: Internal Medicine

## 2015-04-21 ENCOUNTER — Ambulatory Visit (HOSPITAL_COMMUNITY)
Admission: RE | Admit: 2015-04-21 | Discharge: 2015-04-21 | Disposition: A | Discharge status: Home / Self Care | Payer: Commercial Managed Care - HMO | Source: Ambulatory Visit | Attending: Internal Medicine | Admitting: Internal Medicine

## 2015-04-21 VITALS — BP 134/72 | HR 85 | Wt 156.0 lb

## 2015-04-21 DIAGNOSIS — I447 Left bundle-branch block, unspecified: Secondary | ICD-10-CM | POA: Insufficient documentation

## 2015-04-21 DIAGNOSIS — Z7901 Long term (current) use of anticoagulants: Secondary | ICD-10-CM | POA: Diagnosis not present

## 2015-04-21 DIAGNOSIS — Z95810 Presence of automatic (implantable) cardiac defibrillator: Secondary | ICD-10-CM | POA: Diagnosis not present

## 2015-04-21 DIAGNOSIS — I5022 Chronic systolic (congestive) heart failure: Secondary | ICD-10-CM | POA: Insufficient documentation

## 2015-04-21 DIAGNOSIS — Z833 Family history of diabetes mellitus: Secondary | ICD-10-CM | POA: Insufficient documentation

## 2015-04-21 DIAGNOSIS — G609 Hereditary and idiopathic neuropathy, unspecified: Secondary | ICD-10-CM | POA: Insufficient documentation

## 2015-04-21 DIAGNOSIS — Z888 Allergy status to other drugs, medicaments and biological substances status: Secondary | ICD-10-CM | POA: Insufficient documentation

## 2015-04-21 DIAGNOSIS — E039 Hypothyroidism, unspecified: Secondary | ICD-10-CM | POA: Diagnosis not present

## 2015-04-21 DIAGNOSIS — I2699 Other pulmonary embolism without acute cor pulmonale: Secondary | ICD-10-CM

## 2015-04-21 DIAGNOSIS — Z853 Personal history of malignant neoplasm of breast: Secondary | ICD-10-CM | POA: Insufficient documentation

## 2015-04-21 DIAGNOSIS — N183 Chronic kidney disease, stage 3 unspecified: Secondary | ICD-10-CM

## 2015-04-21 DIAGNOSIS — J45909 Unspecified asthma, uncomplicated: Secondary | ICD-10-CM | POA: Diagnosis not present

## 2015-04-21 DIAGNOSIS — Z8249 Family history of ischemic heart disease and other diseases of the circulatory system: Secondary | ICD-10-CM | POA: Diagnosis not present

## 2015-04-21 DIAGNOSIS — Z86718 Personal history of other venous thrombosis and embolism: Secondary | ICD-10-CM | POA: Diagnosis not present

## 2015-04-21 DIAGNOSIS — E7800 Pure hypercholesterolemia, unspecified: Secondary | ICD-10-CM | POA: Diagnosis not present

## 2015-04-21 DIAGNOSIS — F329 Major depressive disorder, single episode, unspecified: Secondary | ICD-10-CM | POA: Insufficient documentation

## 2015-04-21 DIAGNOSIS — Z823 Family history of stroke: Secondary | ICD-10-CM | POA: Insufficient documentation

## 2015-04-21 DIAGNOSIS — I130 Hypertensive heart and chronic kidney disease with heart failure and stage 1 through stage 4 chronic kidney disease, or unspecified chronic kidney disease: Secondary | ICD-10-CM | POA: Diagnosis not present

## 2015-04-21 DIAGNOSIS — F419 Anxiety disorder, unspecified: Secondary | ICD-10-CM | POA: Diagnosis not present

## 2015-04-21 DIAGNOSIS — Z86711 Personal history of pulmonary embolism: Secondary | ICD-10-CM | POA: Insufficient documentation

## 2015-04-21 DIAGNOSIS — Z79899 Other long term (current) drug therapy: Secondary | ICD-10-CM | POA: Insufficient documentation

## 2015-04-21 MED ORDER — HYDRALAZINE HCL 25 MG PO TABS: 25.0000 mg | ORAL_TABLET | Freq: Three times a day (TID) | ORAL | Status: DC

## 2015-04-21 MED ORDER — APIXABAN 2.5 MG PO TABS: 2.5000 mg | ORAL_TABLET | Freq: Two times a day (BID) | ORAL | Status: DC

## 2015-04-21 MED ORDER — APIXABAN 5 MG PO TABS: 5.0000 mg | ORAL_TABLET | Freq: Two times a day (BID) | ORAL | Status: DC

## 2015-04-21 MED ORDER — AMOXICILLIN 500 MG PO TABS: 2000.0000 mg | ORAL_TABLET | ORAL | Status: DC | PRN

## 2015-04-21 NOTE — Addendum Note (Signed)
Encounter addended by: Scarlette Calico, RN on: 04/21/2015  2:43 PM<BR>     Documentation filed: Medications, Orders, Patient Instructions Section

## 2015-04-21 NOTE — Patient Instructions (Addendum)
Increase Hydralazine to 25 mg (1 tab) Three times a day   Stop Xarelto  Start Eliquis 2.5 mg Twice daily   Take Amoxicillin 2000 mg (4 tabs) 1 hour before dental work  Labs with home health in 1-2 weeks  Your physician recommends that you schedule a follow-up appointment in: 2 months

## 2015-04-21 NOTE — Progress Notes (Signed)
Patient ID: Alexandra [name], female   DOB: 1927/03/18, 82 y.o.   MRN: [id number]     Advanced Heart Failure Clinic Note   Primary Care: Dr. Melinda Crutch Primary Cardiologist: Dr Candee Furbish Primary HF: Dr Haroldine Laws  HPI: TAHIRI KOFOED is a 82 y.o. 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 Discharge weight was 196.  She was admitted in July 2016 for Abdominal pain and SOB. Her Gallbladder was thought to be causing her pain, so a lap choley was performed that admission. Echo on 08/15/14 showed EF 20%. There were thoughts that she may have stress induced CMP due to the death of her husband in 2022-05-23 after several months of hospice care.   She was directly admitted to [facility] on 10/11/14 with concerns for low output with SBPs in 80s and tachycardia.  PICC line was placed with initial co-ox of 61%.CVP was 15. Milrinone started when co-ox dropped to 53%. She had SVT on milrinone 0.25 so started on amio. Developed acute SOB that improved quickly with cessation of amio and extra dose of IV lasix. Felt to possibly have acute amio toxicity. Milrinone decreased to 0.125 and no further PSVT. Sent home on milrinone 0.125. Diuresed well on 80 mg lasix IB BID. Discharge weight was 170.  Admitted 12/1 through 12/6 with PICC line infection. Bcx with [facility] 2/2 with for diptheroids. Bcx 1/2 at [facility]-Bacteremia-cornybacterium . PICC line replaced. Completed antibiotic course. She continued on milrinone 0.125 mcg. Started ivabradine.  Placement of CRT delayed.  S/p CRT-D placement 01/16/15 with Dr Caryl Comes.   Labs 01/29/15  with WBCs trending up 7.9 -> 10 -> 12. BCx ordered with [facility]. On 01/31/15 [facility] with 1/2 BCX + GPC clusters. (I do not have final results from them) Refused admission. Treated with  home vancomycin for 1 week. PICC line not changed.   She returns today for regular follow up. At last visit we stopped milrinone (02/2315). Initially she felt very tired but this has gotten much better although still fatigued at times. Able to do all ADLs. Continues with home PT. Limited by R knee pain. Weight stable. No orthopnea or PND. Appetite better. Saw Dr. Johnnye Sima in ID and BCx drawn which were negative.    ECHO 10/02/14 LVEF 15% with diffuse hypokinesis, RV mildly dilated, normal function, PA peak pressure 59 mmHg Echo 2/17 EF 40-45%  Labs 3/17: Hgb 10.8, K 4.6 Cr 1.38   Past Medical History  Diagnosis Date  . Arthritis   . Headache(784.0)   . Anxiety   . Depression   . Asthma   . CAP (community acquired pneumonia) 12/19/2012  . Obesity   . Peripheral neuropathy ([place])   . Sacroiliitis, not elsewhere classified ([place])   . Osteopenia   . Hypercholesteremia   . Neuropathy ([place])   . Trigger finger     [name]  . Chest pain     NM stress test 05/2011 Normal  . DOE (dyspnea on exertion)     No SOB at rest  . Fatigue     excertional  . breast ca dx'd 2000    left  . Essential (primary) hypertension 04/02/2014  . Adult hypothyroidism 04/02/2014  . Hereditary and idiopathic peripheral neuropathy 07/02/2014  . Leg weakness   .  CKD (chronic kidney disease) stage 3, GFR 30-59 ml/min 08/13/2014  . Anemia of chronic disease 08/13/2014  . CHF (congestive heart failure) ([place]) 08/19/2014  . Situational depression   . Presence of permanent cardiac pacemaker 01/16/2015    BIV  . Intermittent LBBB (left bundle branch block) 12/21/2012    Current Outpatient Prescriptions  Medication Sig Dispense Refill  . acetaminophen (TYLENOL) 500 MG tablet Take 500 mg by mouth every 6 (six) hours as needed for mild pain or headache.     . albuterol (PROVENTIL HFA;VENTOLIN HFA) 108 (90 BASE) MCG/ACT inhaler Inhale 2 puffs into the lungs every 6 (six) hours as needed for wheezing or shortness of breath.     . ALPRAZolam (XANAX) 0.25 MG tablet Take 0.25 mg by mouth 3 (three) times daily as needed for anxiety.    Marland Kitchen b complex vitamins tablet Take 1 tablet by mouth daily with lunch.     . calcium carbonate (TUMS - DOSED IN MG ELEMENTAL CALCIUM) 500 MG chewable tablet Chew 2 tablets by mouth daily as needed for indigestion or heartburn.    . Calcium Carbonate-Vitamin D (CALTRATE 600+D PO) Take 1 tablet by mouth daily with lunch.    . cholecalciferol (VITAMIN D) 1000 UNITS tablet Take 1,000 Units by mouth daily with lunch.     . citalopram (CELEXA) 20 MG tablet Take 1 tablet (20 mg total) by mouth daily. 30 tablet 6  . CORLANOR 5 MG TABS tablet TAKE 1 TABLET BY MOUTH TWICE DAILY WITH A MEAL.. 60 tablet 0  . digoxin (LANOXIN) 0.125 MG tablet Take 0.5 tablets (0.0625 mg total) by mouth daily. 30 tablet 6  . furosemide (LASIX) 40 MG tablet Take 1 tablet (40 mg total) by mouth daily. 30 tablet 6  . hydrALAZINE (APRESOLINE) 25 MG tablet Take 0.5 tablets (12.5 mg total) by mouth 3 (three) times daily. (Patient not taking: Reported on 03/24/2015) 45 tablet 3  . lactose free nutrition (BOOST) LIQD Take 237 mLs by mouth daily with breakfast.     . levothyroxine (SYNTHROID, LEVOTHROID) 25 MCG tablet Take 25 mcg by mouth daily before breakfast. for thyroid    . MAGNESIUM-OXIDE 400 (241.3 Mg) MG tablet Take 400 mg by mouth daily.  3  . milrinone (PRIMACOR) 20 MG/100ML SOLN infusion Inject 9.65 mcg/min into the vein continuous. 2100 mL 0  . Multiple Vitamin (MULITIVITAMIN WITH MINERALS) TABS Take 1 tablet by mouth daily with lunch.     . nitroGLYCERIN (NITROSTAT) 0.4 MG SL tablet Place 1 tablet (0.4 mg total) under the tongue every 5 (five) minutes as needed for chest pain. 25 tablet 4  . oxyCODONE-acetaminophen (PERCOCET/ROXICET) 5-325 MG tablet Take 1 tablet by mouth every 6 (six) hours as needed for severe pain. Reported on 03/24/2015    . OXYGEN Inhale 2 L into the lungs daily as needed (shortness of breath).     .  pantoprazole (PROTONIX) 40 MG tablet Take 40 mg by mouth daily.     Vladimir Faster Glycol-Propyl Glycol (SYSTANE OP) Place 1 drop into both eyes 2 (two) times daily.    . potassium chloride (K-DUR) 10 MEQ tablet Take 1 tablet (10 mEq total) by mouth daily. 30 tablet 6  . sodium chloride 0.9 % SOLN with milrinone 1 MG/ML SOLN 200 mcg/mL Inject 0.125 mcg/kg/min into the vein continuous.    Marland Kitchen spironolactone (ALDACTONE) 25 MG tablet Take 1 tablet (25 mg total) by mouth daily. 30 tablet 6  . traMADol (ULTRAM) 50 MG tablet Take  1 tablet (50 mg total) by mouth every 6 (six) hours as needed for moderate pain. 60 tablet 3  . XARELTO 20 MG TABS tablet TAKE 1 TABLET BY MOUTH EVERY DAY 30 tablet 3   No current facility-administered medications for this encounter.    Allergies  Allergen Reactions  . Amiodarone Shortness Of Breath and Nausea And Vomiting  . Cymbalta [Duloxetine Hcl] Other (See Comments)    Depression  . Lyrica [Pregabalin] Other (See Comments)    Blurry vision      Social History   Social History  . Marital Status: Married    Spouse Name: N/A  . Number of Children: 5  . Years of Education: HS   Occupational History  . Retired    Social History Main Topics  . Smoking status: Never Smoker   . Smokeless tobacco: Never Used  . Alcohol Use: No  . Drug Use: No  . Sexual Activity: Not on file   Other Topics Concern  . Not on file   Social History Narrative   Lives at home alone.   Right-handed.   Two cups caffeine daily (coffee).      Family History  Problem Relation Age of Onset  . Diabetes Brother   . Alzheimer's disease Sister   . CVA Daughter     01/21/09  . Pancreatic cancer Father 31  . Pneumonia Mother 96    cause of death  . Heart attack Neg Hx   . Stroke Daughter   . Hypertension Daughter     Danley Danker Vitals:   04/21/15 1413  BP: 134/72  Pulse: 85  Weight: 156 lb (70.761 kg)  SpO2: 93%   Wt Readings from Last 3 Encounters:  04/21/15 156 lb (70.761  kg)  03/13/15 161 lb (73.029 kg)  02/14/15 157 lb 9.6 oz (71.487 kg)    PHYSICAL EXAM: General: In wheelchair, Daughter present.  HEENT: Normal, without mass or lesion. Neck: Supple, no bruits. JVP 6-7 cm. Carotids 2+. No thyromegaly or nodule. Heart: PMI nondisplaced, Regular rate no s4.  no murmur. ICD site ok Lungs: Slightly diminished R base Abdomen: Soft, NT, ND, no HSM +BS x 4.  Extremities: No clubbing, cyanosis. No ankle edema.  LUE PICC site clean, dry, and intact. Left thigh musculature decreased compared to right.  Neuro: Alert and oriented X 3. Moves all extremities spontaneously.  Psych: Affect pleaseant  ASSESSMENT & PLAN:  1. Chronic Systolic Heart failure - Echo 10/02/14 EF 15% Recent echo 2/17 read as 40-45% I reviewed probably closer to 35%. Off milrinone  -NYHA III Volume status ok  - Now s/p St Jude CRT-D placement. 01/16/15 - Volume status stable on exam. - Increase hydralazine 25 mg tid - Continue digoxin 0.0625 mg daily . No bb with low output. Has been off ACE/ARB due to CKD - Continue lasix 40 mg daily + 10 meq potassium daily .   - Continue corlanor 5 mg BID. Can increase to 7.5 bid at next visit.  2. Recent PE/ DVT bilateral by [place] 08/20/2014 - On Xarelto - was 6 months on 02/20/15. Will switch to apixaban 2.5 bid for long-term prevention of DVT 3. CKD Stage 3  - Stable on recent labs via [facility].  4. Hx of LBBB 5. Hypertension  - Stable. Increasing hydralazine  6. Intolerance of amiodarone due to possible acute lung toxicity 7. H/O Bacteremia -  -Saw ID. Bcx negative. No further work-up at this ti,e   Bensimhon, Daniel,MD 2:17 PM

## 2015-04-22 ENCOUNTER — Encounter: Payer: Commercial Managed Care - HMO | Admitting: Internal Medicine

## 2015-04-22 ENCOUNTER — Other Ambulatory Visit (HOSPITAL_COMMUNITY): Payer: Self-pay | Admitting: *Deleted

## 2015-04-22 DIAGNOSIS — F329 Major depressive disorder, single episode, unspecified: Secondary | ICD-10-CM | POA: Diagnosis not present

## 2015-04-22 DIAGNOSIS — E669 Obesity, unspecified: Secondary | ICD-10-CM | POA: Diagnosis not present

## 2015-04-22 DIAGNOSIS — Z950 Presence of cardiac pacemaker: Secondary | ICD-10-CM | POA: Diagnosis not present

## 2015-04-22 DIAGNOSIS — I130 Hypertensive heart and chronic kidney disease with heart failure and stage 1 through stage 4 chronic kidney disease, or unspecified chronic kidney disease: Secondary | ICD-10-CM | POA: Diagnosis not present

## 2015-04-22 DIAGNOSIS — I5023 Acute on chronic systolic (congestive) heart failure: Secondary | ICD-10-CM | POA: Diagnosis not present

## 2015-04-22 DIAGNOSIS — G609 Hereditary and idiopathic neuropathy, unspecified: Secondary | ICD-10-CM | POA: Diagnosis not present

## 2015-04-22 DIAGNOSIS — Z5181 Encounter for therapeutic drug level monitoring: Secondary | ICD-10-CM | POA: Diagnosis not present

## 2015-04-22 DIAGNOSIS — N183 Chronic kidney disease, stage 3 (moderate): Secondary | ICD-10-CM | POA: Diagnosis not present

## 2015-04-22 DIAGNOSIS — Z452 Encounter for adjustment and management of vascular access device: Secondary | ICD-10-CM | POA: Diagnosis not present

## 2015-04-22 MED ORDER — AMOXICILLIN 500 MG PO TABS: 2000.0000 mg | ORAL_TABLET | ORAL | Status: DC | PRN

## 2015-04-24 DIAGNOSIS — I5023 Acute on chronic systolic (congestive) heart failure: Secondary | ICD-10-CM | POA: Diagnosis not present

## 2015-04-24 DIAGNOSIS — Z452 Encounter for adjustment and management of vascular access device: Secondary | ICD-10-CM | POA: Diagnosis not present

## 2015-04-24 DIAGNOSIS — G609 Hereditary and idiopathic neuropathy, unspecified: Secondary | ICD-10-CM | POA: Diagnosis not present

## 2015-04-24 DIAGNOSIS — Z5181 Encounter for therapeutic drug level monitoring: Secondary | ICD-10-CM | POA: Diagnosis not present

## 2015-04-24 DIAGNOSIS — F329 Major depressive disorder, single episode, unspecified: Secondary | ICD-10-CM | POA: Diagnosis not present

## 2015-04-24 DIAGNOSIS — I130 Hypertensive heart and chronic kidney disease with heart failure and stage 1 through stage 4 chronic kidney disease, or unspecified chronic kidney disease: Secondary | ICD-10-CM | POA: Diagnosis not present

## 2015-04-24 DIAGNOSIS — N183 Chronic kidney disease, stage 3 (moderate): Secondary | ICD-10-CM | POA: Diagnosis not present

## 2015-04-24 DIAGNOSIS — Z950 Presence of cardiac pacemaker: Secondary | ICD-10-CM | POA: Diagnosis not present

## 2015-04-24 DIAGNOSIS — E669 Obesity, unspecified: Secondary | ICD-10-CM | POA: Diagnosis not present

## 2015-04-25 ENCOUNTER — Other Ambulatory Visit (HOSPITAL_COMMUNITY): Payer: Self-pay | Admitting: Internal Medicine

## 2015-04-28 ENCOUNTER — Encounter: Payer: Self-pay | Admitting: Internal Medicine

## 2015-04-28 ENCOUNTER — Ambulatory Visit (INDEPENDENT_AMBULATORY_CARE_PROVIDER_SITE_OTHER): Payer: Commercial Managed Care - HMO | Admitting: Internal Medicine

## 2015-04-28 VITALS — BP 126/60 | HR 74 | Ht 64.45 in | Wt 166.0 lb

## 2015-04-28 DIAGNOSIS — I447 Left bundle-branch block, unspecified: Secondary | ICD-10-CM

## 2015-04-28 DIAGNOSIS — I5022 Chronic systolic (congestive) heart failure: Secondary | ICD-10-CM

## 2015-04-28 DIAGNOSIS — Z950 Presence of cardiac pacemaker: Secondary | ICD-10-CM

## 2015-04-28 DIAGNOSIS — I429 Cardiomyopathy, unspecified: Secondary | ICD-10-CM

## 2015-04-28 DIAGNOSIS — I428 Other cardiomyopathies: Secondary | ICD-10-CM

## 2015-04-28 NOTE — Patient Instructions (Signed)
Medication Instructions: - Your physician recommends that you continue on your current medications as directed. Please refer to the Current Medication list given to you today.  Labwork: - none  Procedures/Testing: - none  Follow-Up: - Remote monitoring is used to monitor your Pacemaker of ICD from home. This monitoring reduces the number of office visits required to check your device to one time per year. It allows [place] to keep an eye on the functioning of your device to ensure it is working properly. You are scheduled for a device check from home on 07/28/15. You may send your transmission at any time that day. If you have a wireless device, the transmission will be sent automatically. After your physician reviews your transmission, you will receive a postcard with your next transmission date.  - Your physician wants you to follow-up in: December 2017 with Dr. Caryl Comes. You will receive a reminder letter in the mail two months in advance. If you don't receive a letter, please call our office to schedule the follow-up appointment.  Any Additional Special Instructions Will Be Listed Below (If Applicable).     If you need a refill on your cardiac medications before your next appointment, please call your pharmacy.

## 2015-04-28 NOTE — Progress Notes (Signed)
ELECTROPHYSIOLOGY office  NOTE  Patient ID: Alexandra [name], MRN: [id number], DOB/AGE: November 18, 1927 82 y.o. Admit date: (Not on file) Date of Consult: 04/28/2015  Primary Physician:  Melinda Crutch, MD Primary Cardiologist: DB  Chief Complaint: CRT-P   HPI Alexandra [name] is a 82 y.o. female  Seen following  CRT  implant  She has a history of nonischemic cardiomyopathy and left bundle branch block. It was initially thought to be related to [place] when she presented 7/16 EF at that time was 20%. She was admitted 9/16 with low output and was treated with milrinone down titration of which was required because of tachycardia. 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  Milrinone has been continued at very low doses. She remains functionally very impaired able to walk less than 50 feet. She is able to sleep mostly flat. She has scant edema.   there is initiall not much of her response to CRT. We reprorammed her AV delays to produce an upright QRs in lead V1; she sbsequently underwent AV optimization by echo   she was subsequently able to have her milrinone discontinued  She is much much better. She is walking around the hou se    she has had no edema.   She does complain of fatigue.   Past Medical History  Diagnosis Date  . Arthritis   . Headache(784.0)   . Anxiety   . Depression   . Asthma   . CAP (community acquired pneumonia) 12/19/2012  . Obesity   . Peripheral neuropathy ([place])   . Sacroiliitis, not elsewhere classified ([place])   . Osteopenia   . Hypercholesteremia   . Neuropathy ([place])   . Trigger finger     [name]  . Chest pain     NM stress test 05/2011 Normal  . DOE (dyspnea on exertion)     No SOB at rest  . Fatigue     excertional  . breast ca dx'd 2000    left  . Essential (primary) hypertension 04/02/2014  . Adult hypothyroidism 04/02/2014  . Hereditary  and idiopathic peripheral neuropathy 07/02/2014  . Leg weakness   . CKD (chronic kidney disease) stage 3, GFR 30-59 ml/min 08/13/2014  . Anemia of chronic disease 08/13/2014  . CHF (congestive heart failure) ([place]) 08/19/2014  . Situational depression   . Presence of permanent cardiac pacemaker 01/16/2015    BIV  . Intermittent LBBB (left bundle branch block) 12/21/2012      Surgical History:  Past Surgical History  Procedure Laterality Date  . Leg surgery    . Breast lumpectomy      breast cancer  . Lumbar laminectomy    . Achilles tendon surgery    . Tonsillectomy    . Vesicovaginal fistula closure w/ tah      DC hysterectomy  . Polypectomy    . Cholecystectomy N/A 08/13/2014    Procedure: LAPAROSCOPIC CHOLECYSTECTOMY;  Surgeon: Stark Carmie Lanpher, MD;  Location: [facility];  Service: General;  Laterality: N/A;  . Ep implantable device N/A 01/16/2015    Procedure: BiV Pacemaker Insertion CRT-P;  Surgeon: Deboraha Sprang, MD;  Location: [facility];  Service: Cardiovascular;  Laterality: N/A;     Home Meds: Prior to Admission medications   Medication Sig Start Date End Date Taking? Authorizing Provider  acetaminophen (TYLENOL) 500 MG tablet Take 500 mg by mouth every 6 (six) hours as  needed for mild pain.   Yes Historical Provider, MD  ALPRAZolam (XANAX) 0.25 MG tablet Take 0.25 mg by mouth 3 (three) times daily as needed for anxiety.   Yes Historical Provider, MD  b complex vitamins tablet Take 1 tablet by mouth every evening.    Yes Historical Provider, MD  Calcium Carbonate (CALTRATE 600 PO) Take 600 mg by mouth 2 (two) times daily.   Yes Historical Provider, MD  calcium carbonate (TUMS - DOSED IN MG ELEMENTAL CALCIUM) 500 MG chewable tablet Chew 2 tablets by mouth daily as needed for indigestion or heartburn.   Yes Historical Provider, MD  cholecalciferol (VITAMIN D) 1000 UNITS tablet Take 1,000 Units by mouth daily.   Yes Historical Provider, MD  citalopram (CELEXA) 20 MG tablet Take  1 tablet (20 mg total) by mouth daily. 11/27/14  Yes Jolaine Artist, MD  digoxin 62.5 MCG TABS Take 0.0625 mg by mouth daily. 10/15/14  Yes Shirley Friar, PA-C  furosemide (LASIX) 40 MG tablet Take 1 tablet (40 mg total) by mouth daily. 10/15/14  Yes Shirley Friar, PA-C  lactose free nutrition (BOOST) LIQD Take 237 mLs by mouth daily with breakfast.    Yes Historical Provider, MD  levothyroxine (SYNTHROID, LEVOTHROID) 25 MCG tablet Take one tablet by mouth once daily in the morning before breakfast for thyroid   Yes Historical Provider, MD  milrinone (PRIMACOR) 20 MG/100ML SOLN infusion Inject 9.875 mcg/min into the vein continuous. 10/15/14  Yes Shirley Friar, PA-C  Multiple Vitamin (MULITIVITAMIN WITH MINERALS) TABS Take 1 tablet by mouth daily.   Yes Historical Provider, MD  nitroGLYCERIN (NITROSTAT) 0.4 MG SL tablet Place 1 tablet (0.4 mg total) under the tongue every 5 (five) minutes as needed for chest pain. 11/22/14  Yes Jolaine Artist, MD  OXYGEN Inhale 2 L into the lungs daily.   Yes Historical Provider, MD  pantoprazole (PROTONIX) 40 MG tablet Take 40 mg by mouth 2 (two) times daily.   Yes Historical Provider, MD  polyethylene glycol (MIRALAX / GLYCOLAX) packet Take 17 g by mouth daily as needed for mild constipation.   Yes Historical Provider, MD  potassium chloride (K-DUR) 10 MEQ tablet Take 1 tablet (10 mEq total) by mouth daily. 10/15/14  Yes Shirley Friar, PA-C  promethazine (PHENERGAN) 25 MG tablet Take one tablet by mouth every 8 hours as needed for nausea   Yes Historical Provider, MD  sodium chloride (OCEAN) 0.65 % SOLN nasal spray Place 1 spray into both nostrils as needed for congestion.   Yes Historical Provider, MD  spironolactone (ALDACTONE) 25 MG tablet Take 1 tablet (25 mg total) by mouth daily. 10/15/14  Yes Shirley Friar, PA-C  traMADol (ULTRAM) 50 MG tablet Take 1 tablet (50 mg total) by mouth every 6 (six) hours as needed for  moderate pain. 08/23/14  Yes Charlynne Cousins, MD  VENTOLIN HFA 108 (90 BASE) MCG/ACT inhaler INhaLe 2 PufFS by mouth every 4 hours as needed for shortness of breath or wheezing 07/19/14  Yes Historical Provider, MD  XARELTO 20 MG TABS tablet TAKE 1 TABLET BY MOUTH EVERY DAY 11/13/14  Yes Jolaine Artist, MD    Allergies:  Allergies  Allergen Reactions  . Amiodarone Shortness Of Breath and Nausea And Vomiting  . Cymbalta [Duloxetine Hcl] Other (See Comments)    Depression  . Lyrica [Pregabalin] Other (See Comments)    Blurry vision    Social History   Social History  . Marital Status:  Married    Spouse Name: N/A  . Number of Children: 5  . Years of Education: HS   Occupational History  . Retired    Social History Main Topics  . Smoking status: Never Smoker   . Smokeless tobacco: Never Used  . Alcohol Use: No  . Drug Use: No  . Sexual Activity: Not on file   Other Topics Concern  . Not on file   Social History Narrative   Lives at home alone.   Right-handed.   Two cups caffeine daily (coffee).     Family History  Problem Relation Age of Onset  . Diabetes Brother   . Alzheimer's disease Sister   . CVA Daughter     01/21/09  . Pancreatic cancer Father 84  . Pneumonia Mother 36    cause of death  . Heart attack Neg Hx   . Stroke Daughter   . Hypertension Daughter      ROS:  Please see the history of present illness.     All other systems reviewed and negative.    Physical Exam: Blood pressure 126/60, pulse 74, height 5' 4.45" (1.637 m), weight 166 lb (75.297 kg). General: Well developed, well nourished female in no acute distress. Head: Normocephalic, atraumatic, sclera non-icteric, no xanthomas, nares are without discharge. EENT: normal  Lymph Nodes:  none Neck: Negative for carotid bruits. JVD not elevated. Back:without scoliosis kyphosis Lungs: Clear bilaterally to auscultation without wheezes, rales, or rhonchi. Breathing is unlabored. Heart: RRR  with S1 S2. 2/6 systolic murmur . No rubs, or gallops appreciated. Abdomen: Soft, non-tender, non-distended with normoactive bowel sounds. No hepatomegaly. No rebound/guarding. No obvious abdominal masses. Msk:  Strength and tone appear normal for age. Extremities: No clubbing or cyanosis.  tr edema.  Distal pedal pulses are 2+ and equal bilaterally. Skin: Warm and Dry  capillary refill less than 1 second Milrinone infusing Neuro: Alert and oriented X 3. CN III-XII intact Grossly normal sensory and motor function . Psych:  Responds to questions appropriately with a normal affect.      Labs: Cardiac Enzymes No results for input(s): CKTOTAL, CKMB, TROPONINI in the last 72 hours. CBC Lab Results  Component Value Date   WBC 7.9 12/24/2014   HGB 11.5* 12/24/2014   HCT 35.2* 12/24/2014   MCV 92.4 12/24/2014   PLT 290 12/24/2014   PROTIME: No results for input(s): LABPROT, INR in the last 72 hours. Chemistry No results for input(s): NA, K, CL, CO2, BUN, CREATININE, CALCIUM, PROT, BILITOT, ALKPHOS, ALT, AST, GLUCOSE in the last 168 hours.  Invalid input(s): LABALBU Lipids No results found for: CHOL, HDL, LDLCALC, TRIG BNP PRO B NATRIURETIC PEPTIDE (BNP)  Date/Time Value Ref Range Status  09/25/2014 01:32 PM 2219.0* 0.0 - 100.0 pg/mL Final  12/18/2012 11:00 PM 173.5 0 - 450 pg/mL Final   Thyroid Function Tests: No results for input(s): TSH, T4TOTAL, T3FREE, THYROIDAB in the last 72 hours.  Invalid input(s): FREET3 Miscellaneous Lab Results  Component Value Date   DDIMER 8.14* 08/19/2014    Radiology/Studies:  No results found.  EKG:   Demonstrates sinus rhythm at 74 with PT synchronous biventricular pacing. Upright QRS in lead V1 negative QR   Assessment and Plan:  Presumed nonischemic cardiomyopathy  CHF chronic systolic  LBBB  Sinus tachycardia  CRT   she is euvolemic.   her function is very encouraging. Her fatigue may be medication related ; I do not think  thee furthr opportnities n device reprogramming to address  this.   I will talk with Dr. Reine Just about decreasing her Corlanor at this juncture given her much better heart rate.  He has declined      Virl Axe

## 2015-04-29 DIAGNOSIS — F329 Major depressive disorder, single episode, unspecified: Secondary | ICD-10-CM | POA: Diagnosis not present

## 2015-04-29 DIAGNOSIS — G609 Hereditary and idiopathic neuropathy, unspecified: Secondary | ICD-10-CM | POA: Diagnosis not present

## 2015-04-29 DIAGNOSIS — E669 Obesity, unspecified: Secondary | ICD-10-CM | POA: Diagnosis not present

## 2015-04-29 DIAGNOSIS — N183 Chronic kidney disease, stage 3 (moderate): Secondary | ICD-10-CM | POA: Diagnosis not present

## 2015-04-29 DIAGNOSIS — I5023 Acute on chronic systolic (congestive) heart failure: Secondary | ICD-10-CM | POA: Diagnosis not present

## 2015-04-29 DIAGNOSIS — I130 Hypertensive heart and chronic kidney disease with heart failure and stage 1 through stage 4 chronic kidney disease, or unspecified chronic kidney disease: Secondary | ICD-10-CM | POA: Diagnosis not present

## 2015-04-29 DIAGNOSIS — Z950 Presence of cardiac pacemaker: Secondary | ICD-10-CM | POA: Diagnosis not present

## 2015-04-29 DIAGNOSIS — Z452 Encounter for adjustment and management of vascular access device: Secondary | ICD-10-CM | POA: Diagnosis not present

## 2015-04-29 DIAGNOSIS — Z5181 Encounter for therapeutic drug level monitoring: Secondary | ICD-10-CM | POA: Diagnosis not present

## 2015-04-29 LAB — CUP PACEART INCLINIC DEVICE CHECK
Date Time Interrogation Session: 20170410155932
Implantable Lead Implant Date: 20161229
Implantable Lead Implant Date: 20161229
Implantable Lead Location: 753860
Lead Channel Impedance Value: 837.5 Ohm
Lead Channel Pacing Threshold Amplitude: 1 V
Lead Channel Pacing Threshold Amplitude: 1 V
Lead Channel Pacing Threshold Amplitude: 1.75 V
Lead Channel Pacing Threshold Amplitude: 1.75 V
Lead Channel Pacing Threshold Pulse Width: 0.5 ms
Lead Channel Pacing Threshold Pulse Width: 1 ms
Lead Channel Setting Pacing Amplitude: 1.75 V
Lead Channel Setting Pacing Pulse Width: 0.5 ms
MDC IDC LEAD IMPLANT DT: 20161229
MDC IDC LEAD LOCATION: 753858
MDC IDC LEAD LOCATION: 753859
MDC IDC MSMT BATTERY REMAINING LONGEVITY: 85.2
MDC IDC MSMT BATTERY VOLTAGE: 2.98 V
MDC IDC MSMT LEADCHNL LV PACING THRESHOLD PULSEWIDTH: 1 ms
MDC IDC MSMT LEADCHNL RA IMPEDANCE VALUE: 437.5 Ohm
MDC IDC MSMT LEADCHNL RA PACING THRESHOLD PULSEWIDTH: 0.5 ms
MDC IDC MSMT LEADCHNL RA SENSING INTR AMPL: 5 mV
MDC IDC MSMT LEADCHNL RV IMPEDANCE VALUE: 575 Ohm
MDC IDC MSMT LEADCHNL RV PACING THRESHOLD AMPLITUDE: 0.75 V
MDC IDC MSMT LEADCHNL RV PACING THRESHOLD AMPLITUDE: 0.75 V
MDC IDC MSMT LEADCHNL RV PACING THRESHOLD PULSEWIDTH: 0.5 ms
MDC IDC MSMT LEADCHNL RV PACING THRESHOLD PULSEWIDTH: 0.5 ms
MDC IDC MSMT LEADCHNL RV SENSING INTR AMPL: 12 mV
MDC IDC SET LEADCHNL LV PACING AMPLITUDE: 2.375
MDC IDC SET LEADCHNL LV PACING PULSEWIDTH: 1 ms
MDC IDC SET LEADCHNL RV PACING AMPLITUDE: 2 V
MDC IDC SET LEADCHNL RV SENSING SENSITIVITY: 2 mV
MDC IDC STAT BRADY RA PERCENT PACED: 1.5 %
MDC IDC STAT BRADY RV PERCENT PACED: 99.65 %
Pulse Gen Model: 3262
Pulse Gen Serial Number: 7834528

## 2015-04-30 ENCOUNTER — Encounter: Payer: Self-pay | Admitting: Internal Medicine

## 2015-04-30 DIAGNOSIS — Z452 Encounter for adjustment and management of vascular access device: Secondary | ICD-10-CM | POA: Diagnosis not present

## 2015-04-30 DIAGNOSIS — I5023 Acute on chronic systolic (congestive) heart failure: Secondary | ICD-10-CM | POA: Diagnosis not present

## 2015-04-30 DIAGNOSIS — G609 Hereditary and idiopathic neuropathy, unspecified: Secondary | ICD-10-CM | POA: Diagnosis not present

## 2015-04-30 DIAGNOSIS — Z5181 Encounter for therapeutic drug level monitoring: Secondary | ICD-10-CM | POA: Diagnosis not present

## 2015-04-30 DIAGNOSIS — I130 Hypertensive heart and chronic kidney disease with heart failure and stage 1 through stage 4 chronic kidney disease, or unspecified chronic kidney disease: Secondary | ICD-10-CM | POA: Diagnosis not present

## 2015-04-30 DIAGNOSIS — E669 Obesity, unspecified: Secondary | ICD-10-CM | POA: Diagnosis not present

## 2015-04-30 DIAGNOSIS — N183 Chronic kidney disease, stage 3 (moderate): Secondary | ICD-10-CM | POA: Diagnosis not present

## 2015-04-30 DIAGNOSIS — F329 Major depressive disorder, single episode, unspecified: Secondary | ICD-10-CM | POA: Diagnosis not present

## 2015-04-30 DIAGNOSIS — Z950 Presence of cardiac pacemaker: Secondary | ICD-10-CM | POA: Diagnosis not present

## 2015-05-01 DIAGNOSIS — Z452 Encounter for adjustment and management of vascular access device: Secondary | ICD-10-CM | POA: Diagnosis not present

## 2015-05-01 DIAGNOSIS — F329 Major depressive disorder, single episode, unspecified: Secondary | ICD-10-CM | POA: Diagnosis not present

## 2015-05-01 DIAGNOSIS — E669 Obesity, unspecified: Secondary | ICD-10-CM | POA: Diagnosis not present

## 2015-05-01 DIAGNOSIS — N183 Chronic kidney disease, stage 3 (moderate): Secondary | ICD-10-CM | POA: Diagnosis not present

## 2015-05-01 DIAGNOSIS — Z950 Presence of cardiac pacemaker: Secondary | ICD-10-CM | POA: Diagnosis not present

## 2015-05-01 DIAGNOSIS — Z5181 Encounter for therapeutic drug level monitoring: Secondary | ICD-10-CM | POA: Diagnosis not present

## 2015-05-01 DIAGNOSIS — I130 Hypertensive heart and chronic kidney disease with heart failure and stage 1 through stage 4 chronic kidney disease, or unspecified chronic kidney disease: Secondary | ICD-10-CM | POA: Diagnosis not present

## 2015-05-01 DIAGNOSIS — I5023 Acute on chronic systolic (congestive) heart failure: Secondary | ICD-10-CM | POA: Diagnosis not present

## 2015-05-01 DIAGNOSIS — G609 Hereditary and idiopathic neuropathy, unspecified: Secondary | ICD-10-CM | POA: Diagnosis not present

## 2015-05-02 ENCOUNTER — Other Ambulatory Visit (HOSPITAL_COMMUNITY): Payer: Self-pay | Admitting: Student

## 2015-05-06 ENCOUNTER — Encounter: Payer: Self-pay | Admitting: Internal Medicine

## 2015-05-06 DIAGNOSIS — G609 Hereditary and idiopathic neuropathy, unspecified: Secondary | ICD-10-CM | POA: Diagnosis not present

## 2015-05-06 DIAGNOSIS — Z5181 Encounter for therapeutic drug level monitoring: Secondary | ICD-10-CM | POA: Diagnosis not present

## 2015-05-06 DIAGNOSIS — E669 Obesity, unspecified: Secondary | ICD-10-CM | POA: Diagnosis not present

## 2015-05-06 DIAGNOSIS — F329 Major depressive disorder, single episode, unspecified: Secondary | ICD-10-CM | POA: Diagnosis not present

## 2015-05-06 DIAGNOSIS — N183 Chronic kidney disease, stage 3 (moderate): Secondary | ICD-10-CM | POA: Diagnosis not present

## 2015-05-06 DIAGNOSIS — I130 Hypertensive heart and chronic kidney disease with heart failure and stage 1 through stage 4 chronic kidney disease, or unspecified chronic kidney disease: Secondary | ICD-10-CM | POA: Diagnosis not present

## 2015-05-06 DIAGNOSIS — Z950 Presence of cardiac pacemaker: Secondary | ICD-10-CM | POA: Diagnosis not present

## 2015-05-06 DIAGNOSIS — I5023 Acute on chronic systolic (congestive) heart failure: Secondary | ICD-10-CM | POA: Diagnosis not present

## 2015-05-06 DIAGNOSIS — Z452 Encounter for adjustment and management of vascular access device: Secondary | ICD-10-CM | POA: Diagnosis not present

## 2015-05-08 DIAGNOSIS — G609 Hereditary and idiopathic neuropathy, unspecified: Secondary | ICD-10-CM | POA: Diagnosis not present

## 2015-05-08 DIAGNOSIS — Z452 Encounter for adjustment and management of vascular access device: Secondary | ICD-10-CM | POA: Diagnosis not present

## 2015-05-08 DIAGNOSIS — I130 Hypertensive heart and chronic kidney disease with heart failure and stage 1 through stage 4 chronic kidney disease, or unspecified chronic kidney disease: Secondary | ICD-10-CM | POA: Diagnosis not present

## 2015-05-08 DIAGNOSIS — E669 Obesity, unspecified: Secondary | ICD-10-CM | POA: Diagnosis not present

## 2015-05-08 DIAGNOSIS — N183 Chronic kidney disease, stage 3 (moderate): Secondary | ICD-10-CM | POA: Diagnosis not present

## 2015-05-08 DIAGNOSIS — I5023 Acute on chronic systolic (congestive) heart failure: Secondary | ICD-10-CM | POA: Diagnosis not present

## 2015-05-08 DIAGNOSIS — Z950 Presence of cardiac pacemaker: Secondary | ICD-10-CM | POA: Diagnosis not present

## 2015-05-08 DIAGNOSIS — Z5181 Encounter for therapeutic drug level monitoring: Secondary | ICD-10-CM | POA: Diagnosis not present

## 2015-05-08 DIAGNOSIS — F329 Major depressive disorder, single episode, unspecified: Secondary | ICD-10-CM | POA: Diagnosis not present

## 2015-05-09 ENCOUNTER — Other Ambulatory Visit (HOSPITAL_COMMUNITY): Payer: Self-pay | Admitting: Internal Medicine

## 2015-05-10 ENCOUNTER — Other Ambulatory Visit (HOSPITAL_COMMUNITY): Payer: Self-pay | Admitting: Internal Medicine

## 2015-05-10 ENCOUNTER — Other Ambulatory Visit (HOSPITAL_COMMUNITY): Payer: Self-pay | Admitting: Student

## 2015-05-14 DIAGNOSIS — F329 Major depressive disorder, single episode, unspecified: Secondary | ICD-10-CM | POA: Diagnosis not present

## 2015-05-14 DIAGNOSIS — Z950 Presence of cardiac pacemaker: Secondary | ICD-10-CM | POA: Diagnosis not present

## 2015-05-14 DIAGNOSIS — Z5181 Encounter for therapeutic drug level monitoring: Secondary | ICD-10-CM | POA: Diagnosis not present

## 2015-05-14 DIAGNOSIS — I5023 Acute on chronic systolic (congestive) heart failure: Secondary | ICD-10-CM | POA: Diagnosis not present

## 2015-05-14 DIAGNOSIS — N183 Chronic kidney disease, stage 3 (moderate): Secondary | ICD-10-CM | POA: Diagnosis not present

## 2015-05-14 DIAGNOSIS — G609 Hereditary and idiopathic neuropathy, unspecified: Secondary | ICD-10-CM | POA: Diagnosis not present

## 2015-05-14 DIAGNOSIS — I130 Hypertensive heart and chronic kidney disease with heart failure and stage 1 through stage 4 chronic kidney disease, or unspecified chronic kidney disease: Secondary | ICD-10-CM | POA: Diagnosis not present

## 2015-05-14 DIAGNOSIS — E669 Obesity, unspecified: Secondary | ICD-10-CM | POA: Diagnosis not present

## 2015-05-14 DIAGNOSIS — Z452 Encounter for adjustment and management of vascular access device: Secondary | ICD-10-CM | POA: Diagnosis not present

## 2015-05-19 ENCOUNTER — Telehealth (HOSPITAL_COMMUNITY): Payer: Self-pay | Admitting: *Deleted

## 2015-05-19 NOTE — Telephone Encounter (Signed)
Pt's daughter called concerned about pt, she states over the past 3 days or so pt has been much more fatigued than usual and has not had the energy to do anything, she states pt has just laid around all weekend.  She states pt's wt is stable, denies CP, edema, nausea or SOB.  Advised not sure anything to do at this point will send to Dr Haroldine Laws to review

## 2015-05-19 NOTE — Telephone Encounter (Signed)
If continues will need bloodwork and [facility] next week.

## 2015-05-20 NOTE — Telephone Encounter (Signed)
Spoke w/pt, she is aware and agreeable, she is a little hesitant about needing milrinone again.  She will call us back on Mon w/update on how she is feeling

## 2015-05-22 ENCOUNTER — Other Ambulatory Visit: Payer: Self-pay | Admitting: *Deleted

## 2015-05-22 NOTE — Patient Outreach (Signed)
[facility] office received request for [facility] services for a pt that was previously evaluated but had home health at the time and felt that another nurse was not needed at the time. I was advised to call pt's daughter, Keeona Liebelt.  Ms. Hayner updated me on her mothers health. She has CHF, was on a milrinone drip which has been discontinued. She now has a pacemaker. She no longer has home health services. She does have a caregiver 3 days a week for 24 hours but her last day will be May19. Mrs. Colfer lives alone otherwise. She has a son in town and her daughter lives in [place].  Mrs. Moorhouse is weighing daily and taking her BP. She can fill her own med box and take them as far as her daughter knows. The Family is concerned for her safety as the pt does have OA and uses either a cane or a walker. She does have a panic button she can use to call for help.  Mrs. Spatola sees Dr. Rebbeca Paul for her HF.  I told Ms. Barrientez I would be glad to start services. I also told her about [facility] which may be appropriate but I can start and then refer if appropriate.  We made an appt for me to see Mrs. Sacco next Tuesday morning at 10:00 am.  [name] [facility] [phone]

## 2015-05-24 ENCOUNTER — Other Ambulatory Visit (HOSPITAL_COMMUNITY): Payer: Self-pay | Admitting: Student

## 2015-05-27 ENCOUNTER — Encounter: Payer: Self-pay | Admitting: *Deleted

## 2015-05-27 ENCOUNTER — Other Ambulatory Visit: Payer: Self-pay | Admitting: *Deleted

## 2015-05-27 ENCOUNTER — Telehealth (HOSPITAL_COMMUNITY): Payer: Self-pay

## 2015-05-27 VITALS — BP 108/54 | HR 73 | Resp 13 | Ht 65.0 in | Wt 154.0 lb

## 2015-05-27 DIAGNOSIS — I5042 Chronic combined systolic (congestive) and diastolic (congestive) heart failure: Secondary | ICD-10-CM

## 2015-05-27 NOTE — Telephone Encounter (Signed)
Patient called CHF clinic triage line to report she is "feeling much better" after triage call with Nira Conn RN the other day (see phone note). Advised to call us if needed for returning/worsening s/s Aware and agreeable.  [name]

## 2015-05-27 NOTE — Patient Outreach (Addendum)
[facility]) Care Management   05/27/2015  Alexandra [name] 20-Nov-1927 [id number]  Alexandra [name] is an 82 y.o. female  Subjective: Pt's daughter referred pt back to [facility] for active care management as she no longer has home health coming out, milirone drip has been discontinued, caregiver being released. Precious Bard would like our services to help make sure her mother stays on track with her CHF self management and staying out of the hospital.  Objective:   Review of Systems  Constitutional: Negative.   HENT: Negative.   Eyes: Negative.   Respiratory: Negative.   Cardiovascular: Negative.   Gastrointestinal: Negative.   Genitourinary: Negative.   Musculoskeletal: Negative.   Skin: Negative.   Neurological: Negative.   Endo/Heme/Allergies: Negative.   Psychiatric/Behavioral: Negative.    BP 108/54 mmHg  Pulse 73  Resp 13  Ht 1.651 m (5\' 5" )  Wt 154 lb (69.854 kg)  BMI 25.63 kg/m2  SpO2 98%  Physical Exam  Constitutional: She is oriented to person, place, and time. She appears well-developed and well-nourished.  HENT:  Head: Normocephalic and atraumatic.  Cardiovascular: Normal rate, regular rhythm and normal heart sounds.   Respiratory: Effort normal and breath sounds normal.  GI: Soft. Bowel sounds are normal.  Musculoskeletal: Normal range of motion.  Neurological: She is alert and oriented to person, place, and time.  Skin: Skin is warm and dry.  Psychiatric: She has a normal mood and affect. Her behavior is normal. Judgment and thought content normal.    R ankle 25 cm L ankle 24 cm  Encounter Medications:   Outpatient Encounter Prescriptions as of 05/27/2015  Medication Sig Note  . acetaminophen (TYLENOL) 500 MG tablet Take 500 mg by mouth every 6 (six) hours as needed for mild pain or headache.    Marland Kitchen apixaban (ELIQUIS) 2.5 MG TABS tablet Take 1 tablet (2.5 mg total) by mouth 2 (two) times daily.   Marland Kitchen b complex vitamins tablet Take 1 tablet by mouth  daily with lunch.    . calcium carbonate (TUMS - DOSED IN MG ELEMENTAL CALCIUM) 500 MG chewable tablet Chew 2 tablets by mouth daily as needed for indigestion or heartburn.   . Calcium Carbonate-Vitamin D (CALTRATE 600+D PO) Take 1 tablet by mouth daily with lunch.   . cholecalciferol (VITAMIN D) 1000 UNITS tablet Take 1,000 Units by mouth daily with lunch.    . citalopram (CELEXA) 20 MG tablet Take 1 tablet (20 mg total) by mouth daily.   . CORLANOR 5 MG TABS tablet TAKE 1 TABLET BY MOUTH TWICE DAILY WITH A MEAL..   . DIGOX 125 MCG tablet TAKE 1/2 TABLET BY MOUTH EVERY DAY   . furosemide (LASIX) 40 MG tablet Take 1 tablet (40 mg total) by mouth daily.   . hydrALAZINE (APRESOLINE) 25 MG tablet Take 1 tablet (25 mg total) by mouth 3 (three) times daily.   Marland Kitchen lactose free nutrition (BOOST) LIQD Take 237 mLs by mouth daily with breakfast.    . levothyroxine (SYNTHROID, LEVOTHROID) 25 MCG tablet Take 25 mcg by mouth daily before breakfast. for thyroid   . MAGNESIUM-OXIDE 400 (241.3 Mg) MG tablet TAKE 1/2 TABLET BY MOUTH EVERY DAY   . Multiple Vitamin (MULITIVITAMIN WITH MINERALS) TABS Take 1 tablet by mouth daily with lunch.    . pantoprazole (PROTONIX) 40 MG tablet Take 40 mg by mouth daily.    Vladimir Faster Glycol-Propyl Glycol (SYSTANE OP) Place 1 drop into both eyes 2 (two) times daily.   Marland Kitchen  potassium chloride (K-DUR) 10 MEQ tablet TAKE 1 TABLET BY MOUTH DAILY   . spironolactone (ALDACTONE) 25 MG tablet TAKE 1 TABLET BY MOUTH DAILY   . albuterol (PROVENTIL HFA;VENTOLIN HFA) 108 (90 BASE) MCG/ACT inhaler Inhale 2 puffs into the lungs every 6 (six) hours as needed for wheezing or shortness of breath. Reported on 05/27/2015   . ALPRAZolam (XANAX) 0.25 MG tablet Take 0.25 mg by mouth 3 (three) times daily as needed for anxiety. Reported on 05/27/2015 05/27/2015: Only takes prn.  Marland Kitchen amoxicillin (AMOXIL) 500 MG tablet Take 4 tablets (2,000 mg total) by mouth as needed (1 hour before dental work). (Patient not  taking: Reported on 05/27/2015)   . nitroGLYCERIN (NITROSTAT) 0.4 MG SL tablet Place 1 tablet (0.4 mg total) under the tongue every 5 (five) minutes as needed for chest pain. (Patient not taking: Reported on 05/27/2015)   . oxyCODONE-acetaminophen (PERCOCET/ROXICET) 5-325 MG tablet Take 1 tablet by mouth every 6 (six) hours as needed for severe pain. Reported on 05/27/2015   . traMADol (ULTRAM) 50 MG tablet Take 1 tablet (50 mg total) by mouth every 6 (six) hours as needed for moderate pain. (Patient not taking: Reported on 05/27/2015) 05/27/2015: Takes prn.  . [DISCONTINUED] MAGNESIUM-OXIDE 400 (241.3 Mg) MG tablet Take 400 mg by mouth daily. Reported on 05/27/2015 01/30/2015: Received from: External Pharmacy Received Sig: TK 1/2 T PO QD  . [DISCONTINUED] milrinone (PRIMACOR) 20 MG/100ML SOLN infusion Inject 9.65 mcg/min into the vein continuous. 123XX123: See duplicate entry entered by pharmacist 12/19/14  . [DISCONTINUED] sodium chloride 0.9 % SOLN with milrinone 1 MG/ML SOLN 200 mcg/mL Inject 0.125 mcg/kg/min into the vein continuous. 12/19/2014: Home milrinone pump concentration 1mg /11ml - using dosing wt = 77.2 kg   No facility-administered encounter medications on file as of 05/27/2015.    Functional Status:   In your present state of health, do you have any difficulty performing the following activities: 05/27/2015 01/17/2015  Hearing? N -  Vision? N -  Difficulty concentrating or making decisions? N -  Walking or climbing stairs? Y -  Dressing or bathing? Y -  Doing errands, shopping? Tempie Donning  Preparing Food and eating ? N -  Using the Toilet? N -  In the past six months, have you accidently leaked urine? N -  Do you have problems with loss of bowel control? N -  Managing your Medications? N -  Managing your Finances? N -  Housekeeping or managing your Housekeeping? Y -    Fall/Depression Screening:    PHQ 2/9 Scores 05/27/2015 02/07/2015 12/27/2014 11/07/2014 10/21/2014 07/01/2014 04/01/2014  PHQ - 2  Score 1 0 4 0 0 0 0  Exception Documentation - Other- indicate reason in comment box - - - - Other- indicate reason in comment box   Fall Risk  05/27/2015 03/24/2015 02/07/2015 11/07/2014 10/21/2014  Falls in the past year? No Yes Yes Yes No  Number falls in past yr: - 1 1 1  -  Injury with Fall? - No No No -  Risk for fall due to : Impaired balance/gait - Other (Comment);History of fall(s) Impaired balance/gait;Impaired mobility;Impaired vision;Medication side effect Impaired mobility;Impaired balance/gait;Impaired vision;Medication side effect  Follow up - - Education provided Falls prevention discussed -     THN CM Care Plan Problem One        Most Recent Value   Care Plan Problem One  CHF   Role Documenting the Problem One  Care Management Coordinator   Care Plan for Problem  One  Active   THN Long Term Goal (31-90 days)  Pt will not admit to the hospital for CHF in the next 90 days.   THN Long Term Goal Start Date  05/27/15   Interventions for Problem One Long Term Goal  Reviewed CHF Action Plan, medications, daily activity and encouraged her continuation of these practices.   THN CM Short Term Goal #1 (0-30 days)  Pt will call me if she goes into the [place] for intervention to avoid complications or hospitalization over the next 30 days.Margie Billet CM Care Plan Problem Two        Most Recent Value   Care Plan Problem Two  Pt will weigh daily, take her meds as ordered, take her BP, and exercise 10 minutes daily over the next 30 days.   Role Documenting the Problem Two  Care Management Coordinator     Assessment:  CHF  Plan:   I will see pt in 2 weeks. I provided a MOST form for pt and family to contemplate filling out to post in her home. We discussed end of life issues for some time. I also mentioned a program that we are aligned with for palliative care and Mrs. Libertini said she would like more information which I also provided to her. I will contact Patti and fill her in on today's  visit.  [name] [facility] [phone]

## 2015-05-29 NOTE — Addendum Note (Signed)
Addended by: [name] on: 05/29/2015 04:18 PM   Modules accepted: Orders

## 2015-06-08 ENCOUNTER — Other Ambulatory Visit (HOSPITAL_COMMUNITY): Payer: Self-pay | Admitting: Student

## 2015-06-10 ENCOUNTER — Other Ambulatory Visit: Payer: Self-pay | Admitting: *Deleted

## 2015-06-10 NOTE — Patient Outreach (Signed)
[facility]) Care Management  06/10/2015  Alexandra [name] 10-Nov-1927 [id number]   S:  Routine home visit. Pt is doing very well. She has a new caregiver coming for 4 hours three days a week. She is feeling good and confident about this new arrangement.  She is filling her own med box. She will be doing her own meals now. She eats cereal for breakfast, sandwich for lunch and a frozen dinner for the evening meal. She denies SOB, CP or edema. She does complain about occasional insomnia. She was not able to sleep until 4 pm last night .  O:  BP 122/54 mmHg  Pulse 75  Resp 16  Wt 154 lb (69.854 kg)  SpO2 98%       RRR       Lungs are clear       No edema  A:  CHF stable      Occasional Insomnia  P:  Discussed good sleep hygiene: Avoid caffeine in afternoon, evening, don't watch TV in bed, if watching TV before bed watch calm movie or comedy, get some exercise during the day, drink camomille tea or warm milk to relax, deep breathing exercises. May take melatonin 4mg  if unable to fall asleep.      Encouraged to be activie as much as possible and increase activity as tolerated.      Encouraged safety precautions, to prevent falls.      Continue CHF Action Plan.       Call is any problems arise.      I will see pt in 3 weeks.  [name] [facility] [place] [phone]

## 2015-06-13 ENCOUNTER — Other Ambulatory Visit (HOSPITAL_COMMUNITY): Payer: Self-pay | Admitting: Student

## 2015-06-17 ENCOUNTER — Other Ambulatory Visit: Payer: Self-pay | Admitting: Pharmacist

## 2015-06-17 NOTE — Patient Outreach (Signed)
Alexandra [name]) Care Management  [place]   06/17/2015  [name] 18-Jun-1927 [id number]  Subjective:  Patient was referred to [place] for medication patient assistance by [name], [name] NP.  Placed call to patient who answered and verified her name and date of birth.  Explained purpose of call to patient.   Reviewed patient's medication list with her over the phone and she reports she is most concerned with affording Eliquis.   Patient reports she hasn't entered the coverage gap until this year and is interested in how long the coverage gap lasts on Medicare Part D.  Patient asked if [name] Pharmacist can do anything for her medication costs and it was explained to patient that there may be patient assistance programs that patient can apply for to attempt to gain assistance with her medication cost.     Objective:   Current Medications: Current Outpatient Prescriptions  Medication Sig Dispense Refill  . albuterol (PROVENTIL HFA;VENTOLIN HFA) 108 (90 BASE) MCG/ACT inhaler Inhale 2 puffs into the lungs every 6 (six) hours as needed for wheezing or shortness of breath. Reported on 05/27/2015    . ALPRAZolam (XANAX) 0.25 MG tablet Take 0.25 mg by mouth 3 (three) times daily as needed for anxiety. Reported on 05/27/2015    . amoxicillin (AMOXIL) 500 MG tablet Take 4 tablets (2,000 mg total) by mouth as needed (1 hour before dental work). 4 tablet 3  . apixaban (ELIQUIS) 2.5 MG TABS tablet Take 1 tablet (2.5 mg total) by mouth 2 (two) times daily. 60 tablet 6  . b complex vitamins tablet Take 1 tablet by mouth daily with lunch.     . calcium carbonate (TUMS - DOSED IN MG ELEMENTAL CALCIUM) 500 MG chewable tablet Chew 2 tablets by mouth daily as needed for indigestion or heartburn.    . Calcium Carbonate-Vitamin D (CALTRATE 600+D PO) Take 1 tablet by mouth daily with lunch.    . cholecalciferol (VITAMIN D) 1000 UNITS tablet Take 1,000 Units by mouth daily with  lunch.     . citalopram (CELEXA) 20 MG tablet Take 1 tablet (20 mg total) by mouth daily. 30 tablet 6  . CORLANOR 5 MG TABS tablet TAKE 1 TABLET BY MOUTH TWICE DAILY WITH A MEAL.. 60 tablet 3  . DIGOX 125 MCG tablet TAKE 1/2 TABLET BY MOUTH EVERY DAY 15 tablet 4  . furosemide (LASIX) 40 MG tablet TAKE 1 TABLET BY MOUTH DAILY 30 tablet 4  . hydrALAZINE (APRESOLINE) 25 MG tablet Take 1 tablet (25 mg total) by mouth 3 (three) times daily. 90 tablet 3  . lactose free nutrition (BOOST) LIQD Take 237 mLs by mouth daily with breakfast.     . levothyroxine (SYNTHROID, LEVOTHROID) 25 MCG tablet Take 25 mcg by mouth daily before breakfast. for thyroid    . MAGNESIUM-OXIDE 400 (241.3 Mg) MG tablet TAKE 1/2 TABLET BY MOUTH EVERY DAY 15 tablet 0  . Multiple Vitamin (MULITIVITAMIN WITH MINERALS) TABS Take 1 tablet by mouth daily with lunch.     . pantoprazole (PROTONIX) 40 MG tablet Take 40 mg by mouth daily.     Vladimir Faster Glycol-Propyl Glycol (SYSTANE OP) Place 1 drop into both eyes 2 (two) times daily.    . potassium chloride (K-DUR) 10 MEQ tablet TAKE 1 TABLET BY MOUTH DAILY 30 tablet 3  . spironolactone (ALDACTONE) 25 MG tablet TAKE 1 TABLET BY MOUTH DAILY 30 tablet 4  . acetaminophen (TYLENOL) 500 MG tablet Take 500  mg by mouth every 6 (six) hours as needed for mild pain or headache. Reported on 06/17/2015    . nitroGLYCERIN (NITROSTAT) 0.4 MG SL tablet Place 1 tablet (0.4 mg total) under the tongue every 5 (five) minutes as needed for chest pain. (Patient not taking: Reported on 05/27/2015) 25 tablet 4  . oxyCODONE-acetaminophen (PERCOCET/ROXICET) 5-325 MG tablet Take 1 tablet by mouth every 6 (six) hours as needed for severe pain. Reported on 06/17/2015    . traMADol (ULTRAM) 50 MG tablet Take 1 tablet (50 mg total) by mouth every 6 (six) hours as needed for moderate pain. (Patient not taking: Reported on 05/27/2015) 60 tablet 3   No current facility-administered medications for this visit.    Functional  Status: In your present state of health, do you have any difficulty performing the following activities: 05/27/2015 01/17/2015  Hearing? N -  Vision? N -  Difficulty concentrating or making decisions? N -  Walking or climbing stairs? Y -  Dressing or bathing? Y -  Doing errands, shopping? Tempie Donning  Preparing Food and eating ? N -  Using the Toilet? N -  In the past six months, have you accidently leaked urine? N -  Do you have problems with loss of bowel control? N -  Managing your Medications? N -  Managing your Finances? N -  Housekeeping or managing your Housekeeping? Y -    Fall/Depression Screening: PHQ 2/9 Scores 05/27/2015 02/07/2015 12/27/2014 11/07/2014 10/21/2014 07/01/2014 04/01/2014  PHQ - 2 Score 1 0 4 0 0 0 0  Exception Documentation - Other- indicate reason in comment box - - - - Other- indicate reason in comment box    Assessment:  Medication assistance:  Discussed [employer]SA Extra Help with patient, she reports she believes she will be over the income/resource requirements for [employer]SA Extra Help.   Discussed [place] patient assistance program with patient and reviewed the program's published income and out-of-pocket spend requirements with patient.  Explained this is a program that patient can apply for to see if she can get help with the cost of Eliquis.    Discussed how the Medicare Part D coverage gap works, when Part D beneficiaries enter it, exit it, and costs associated with the coverage gap for this year.    Plan:  Patient reports she wishes to discuss patient assistance with her daughter first before she tries to apply.  Provided patient with pharmacist phone number and she repeated it back, and she states she or her daughter will call back.    Offered to call patient in about 1-2 weeks if no response from her or her daughter and she accepted this.    Will route initial note and barriers letter to patient's PCP to make PCP aware of [place] involvement in  patient's care.    Karrie Meres, PharmD, [place] [phone]

## 2015-06-18 ENCOUNTER — Encounter: Payer: Self-pay | Admitting: Pharmacist

## 2015-06-23 ENCOUNTER — Encounter (HOSPITAL_COMMUNITY): Payer: Self-pay | Admitting: Internal Medicine

## 2015-06-23 ENCOUNTER — Ambulatory Visit (HOSPITAL_COMMUNITY)
Admission: RE | Admit: 2015-06-23 | Discharge: 2015-06-23 | Disposition: A | Discharge status: Home / Self Care | Payer: Commercial Managed Care - HMO | Source: Ambulatory Visit | Attending: Internal Medicine | Admitting: Internal Medicine

## 2015-06-23 VITALS — BP 128/58 | HR 77 | Wt 156.2 lb

## 2015-06-23 DIAGNOSIS — N183 Chronic kidney disease, stage 3 unspecified: Secondary | ICD-10-CM

## 2015-06-23 DIAGNOSIS — I130 Hypertensive heart and chronic kidney disease with heart failure and stage 1 through stage 4 chronic kidney disease, or unspecified chronic kidney disease: Secondary | ICD-10-CM | POA: Diagnosis not present

## 2015-06-23 DIAGNOSIS — Z853 Personal history of malignant neoplasm of breast: Secondary | ICD-10-CM | POA: Diagnosis not present

## 2015-06-23 DIAGNOSIS — Z86718 Personal history of other venous thrombosis and embolism: Secondary | ICD-10-CM | POA: Diagnosis not present

## 2015-06-23 DIAGNOSIS — E7800 Pure hypercholesterolemia, unspecified: Secondary | ICD-10-CM | POA: Diagnosis not present

## 2015-06-23 DIAGNOSIS — E039 Hypothyroidism, unspecified: Secondary | ICD-10-CM | POA: Diagnosis not present

## 2015-06-23 DIAGNOSIS — J45909 Unspecified asthma, uncomplicated: Secondary | ICD-10-CM | POA: Insufficient documentation

## 2015-06-23 DIAGNOSIS — I447 Left bundle-branch block, unspecified: Secondary | ICD-10-CM | POA: Insufficient documentation

## 2015-06-23 DIAGNOSIS — Z79899 Other long term (current) drug therapy: Secondary | ICD-10-CM | POA: Diagnosis not present

## 2015-06-23 DIAGNOSIS — Z833 Family history of diabetes mellitus: Secondary | ICD-10-CM | POA: Insufficient documentation

## 2015-06-23 DIAGNOSIS — G609 Hereditary and idiopathic neuropathy, unspecified: Secondary | ICD-10-CM | POA: Diagnosis not present

## 2015-06-23 DIAGNOSIS — Z823 Family history of stroke: Secondary | ICD-10-CM | POA: Diagnosis not present

## 2015-06-23 DIAGNOSIS — Z86711 Personal history of pulmonary embolism: Secondary | ICD-10-CM | POA: Insufficient documentation

## 2015-06-23 DIAGNOSIS — M1990 Unspecified osteoarthritis, unspecified site: Secondary | ICD-10-CM | POA: Diagnosis not present

## 2015-06-23 DIAGNOSIS — Z95810 Presence of automatic (implantable) cardiac defibrillator: Secondary | ICD-10-CM | POA: Insufficient documentation

## 2015-06-23 DIAGNOSIS — Z8249 Family history of ischemic heart disease and other diseases of the circulatory system: Secondary | ICD-10-CM | POA: Insufficient documentation

## 2015-06-23 DIAGNOSIS — I5022 Chronic systolic (congestive) heart failure: Secondary | ICD-10-CM | POA: Insufficient documentation

## 2015-06-23 DIAGNOSIS — Z7901 Long term (current) use of anticoagulants: Secondary | ICD-10-CM | POA: Insufficient documentation

## 2015-06-23 DIAGNOSIS — F329 Major depressive disorder, single episode, unspecified: Secondary | ICD-10-CM | POA: Insufficient documentation

## 2015-06-23 DIAGNOSIS — Z888 Allergy status to other drugs, medicaments and biological substances status: Secondary | ICD-10-CM | POA: Insufficient documentation

## 2015-06-23 DIAGNOSIS — F419 Anxiety disorder, unspecified: Secondary | ICD-10-CM | POA: Insufficient documentation

## 2015-06-23 LAB — BASIC METABOLIC PANEL
Anion gap: 9 (ref 5–15)
BUN: 34 mg/dL — AB (ref 6–20)
CALCIUM: 9.9 mg/dL (ref 8.9–10.3)
CO2: 30 mmol/L (ref 22–32)
Chloride: 100 mmol/L — ABNORMAL LOW (ref 101–111)
Creatinine, Ser: 1.61 mg/dL — ABNORMAL HIGH (ref 0.44–1.00)
GFR calc Af Amer: 32 mL/min — ABNORMAL LOW (ref >60)
GFR, EST NON AFRICAN AMERICAN: 28 mL/min — AB (ref >60)
GLUCOSE: 109 mg/dL — AB (ref 65–99)
Potassium: 4.1 mmol/L (ref 3.5–5.1)
Sodium: 139 mmol/L (ref 135–145)

## 2015-06-23 NOTE — Progress Notes (Signed)
Patient ID: Alexandra [name], female   DOB: [birth year], 82 y.o.   MRN: [id number] Patient ID: Alexandra [name], female   DOB: [birth year], 82 y.o.   MRN: [id number]     Advanced Heart Failure Clinic Note   Primary Care: Dr. Melinda [name] Primary Cardiologist: Dr Alexandra [name] Primary HF: Dr Alexandra [name]  HPI: Alexandra [name] is a 82 y.o. 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 Discharge weight was 196.  She was admitted in July 2016 for Abdominal pain and SOB. Her Gallbladder was thought to be causing her pain, so a lap choley was performed that admission. Echo on 08/15/14 showed EF 20%. There were thoughts that she may have stress induced CMP due to the death of her husband in 05/31/2022 after several months of hospice care.   She was directly admitted to [facility] on 10/11/14 with concerns for low output with SBPs in 80s and tachycardia.  PICC line was placed with initial co-ox of 61%.CVP was 15. Milrinone started when co-ox dropped to 53%. She had SVT on milrinone 0.25 so started on amio. Developed acute SOB that improved quickly with cessation of amio and extra dose of IV lasix. Felt to possibly have acute amio toxicity. Milrinone decreased to 0.125 and no further PSVT. Sent home on milrinone 0.125. Diuresed well on 80 mg lasix IB BID. Discharge weight was 170.  Admitted 12/1 through 12/6 with PICC line infection. Bcx with [facility] 2/2 with for diptheroids. Bcx 1/2 at [facility]-Bacteremia-cornybacterium . PICC line replaced. Completed antibiotic course. She continued on milrinone 0.125 mcg. Started ivabradine.  Placement of CRT delayed.  S/p CRT-D placement 01/16/15 with Dr Caryl Comes.   Labs 01/29/15  with WBCs trending up 7.9 -> 10 -> 12. BCx ordered with [facility]. On 01/31/15 [facility] with 1/2 BCX + GPC  clusters. (I do not have final results from them) Refused admission. Treated with home vancomycin for 1 week. PICC line not changed.   She returns today for HF follow up.  Complaining of fatigue. Now able to walk in the clinic with a walker or a cane. Says can walk around a little more at home. Does not want cardiac rehab. Weight at home 154 pounds. Mild dyspnea with exertion.Denies PND/Orthopnea. Taking all medications.     ECHO 10/02/14 LVEF 15% with diffuse hypokinesis, RV mildly dilated, normal function, PA peak pressure 59 mmHg Echo 2/17 EF 40-45%  Labs 3/17: Hgb 10.8, K 4.6 Cr 1.38   Past Medical History  Diagnosis Date  . Arthritis   . Headache(784.0)   . Anxiety   . Depression   . Asthma   . CAP (community acquired pneumonia) 12/19/2012  . Obesity   . Peripheral neuropathy ([place])   . Sacroiliitis, not elsewhere classified ([place])   . Osteopenia   . Hypercholesteremia   . Neuropathy ([place])   . Trigger finger     Dr. Charlestine [name]  . Chest pain     NM stress test 05/2011 Normal  . DOE (dyspnea on exertion)     No SOB at rest  . Fatigue     excertional  . breast ca dx'd 2000    left  . Essential (primary) hypertension 04/02/2014  . Adult hypothyroidism 04/02/2014  . Hereditary and idiopathic peripheral neuropathy  07/02/2014  . Leg weakness   . CKD (chronic kidney disease) stage 3, GFR 30-59 ml/min 08/13/2014  . Anemia of chronic disease 08/13/2014  . CHF (congestive heart failure) ([place]) 08/19/2014  . Situational depression   . Presence of permanent cardiac pacemaker 01/16/2015    BIV  . Intermittent LBBB (left bundle branch block) 12/21/2012    Current Outpatient Prescriptions  Medication Sig Dispense Refill  . acetaminophen (TYLENOL) 500 MG tablet Take 500 mg by mouth every 6 (six) hours as needed for mild pain or headache. Reported on 06/17/2015    . albuterol (PROVENTIL HFA;VENTOLIN HFA) 108 (90 BASE) MCG/ACT inhaler Inhale 2 puffs into the lungs every 6 (six) hours as needed for  wheezing or shortness of breath. Reported on 05/27/2015    . ALPRAZolam (XANAX) 0.25 MG tablet Take 0.25 mg by mouth 3 (three) times daily as needed for anxiety. Reported on 05/27/2015    . apixaban (ELIQUIS) 2.5 MG TABS tablet Take 1 tablet (2.5 mg total) by mouth 2 (two) times daily. 60 tablet 6  . b complex vitamins tablet Take 1 tablet by mouth daily with lunch.     . calcium carbonate (TUMS - DOSED IN MG ELEMENTAL CALCIUM) 500 MG chewable tablet Chew 2 tablets by mouth daily as needed for indigestion or heartburn.    . Calcium Carbonate-Vitamin D (CALTRATE 600+D PO) Take 1 tablet by mouth daily with lunch.    . cholecalciferol (VITAMIN D) 1000 UNITS tablet Take 1,000 Units by mouth daily with lunch.     . citalopram (CELEXA) 20 MG tablet Take 1 tablet (20 mg total) by mouth daily. 30 tablet 6  . CORLANOR 5 MG TABS tablet TAKE 1 TABLET BY MOUTH TWICE DAILY WITH A MEAL.. 60 tablet 3  . DIGOX 125 MCG tablet TAKE 1/2 TABLET BY MOUTH EVERY DAY 15 tablet 4  . furosemide (LASIX) 40 MG tablet TAKE 1 TABLET BY MOUTH DAILY 30 tablet 4  . hydrALAZINE (APRESOLINE) 25 MG tablet Take 1 tablet (25 mg total) by mouth 3 (three) times daily. 90 tablet 3  . lactose free nutrition (BOOST) LIQD Take 237 mLs by mouth daily with breakfast.     . levothyroxine (SYNTHROID, LEVOTHROID) 25 MCG tablet Take 25 mcg by mouth daily before breakfast. for thyroid    . MAGNESIUM-OXIDE 400 (241.3 Mg) MG tablet TAKE 1/2 TABLET BY MOUTH EVERY DAY 15 tablet 0  . Multiple Vitamin (MULITIVITAMIN WITH MINERALS) TABS Take 1 tablet by mouth daily with lunch.     . pantoprazole (PROTONIX) 40 MG tablet Take 40 mg by mouth daily.     Alexandra [name] Glycol-Propyl Glycol (SYSTANE OP) Place 1 drop into both eyes 2 (two) times daily.    . potassium chloride (K-DUR) 10 MEQ tablet TAKE 1 TABLET BY MOUTH DAILY 30 tablet 3  . spironolactone (ALDACTONE) 25 MG tablet TAKE 1 TABLET BY MOUTH DAILY 30 tablet 4  . nitroGLYCERIN (NITROSTAT) 0.4 MG SL tablet  Place 1 tablet (0.4 mg total) under the tongue every 5 (five) minutes as needed for chest pain. (Patient not taking: Reported on 05/27/2015) 25 tablet 4   No current facility-administered medications for this encounter.    Allergies  Allergen Reactions  . Amiodarone Shortness Of Breath and Nausea And Vomiting  . Cymbalta [Duloxetine Hcl] Other (See Comments)    Depression  . Lyrica [Pregabalin] Other (See Comments)    Blurry vision      Social History   Social History  . Marital  Status: Married    Spouse Name: N/A  . Number of Children: 5  . Years of Education: HS   Occupational History  . Retired    Social History Main Topics  . Smoking status: Never Smoker   . Smokeless tobacco: Never Used  . Alcohol Use: No  . Drug Use: No  . Sexual Activity: No   Other Topics Concern  . Not on file   Social History Narrative   Lives at home alone.   Right-handed.   Two cups caffeine daily (coffee).      Family History  Problem Relation Age of Onset  . Diabetes Brother   . Alzheimer's disease Sister   . CVA Daughter     01/21/09  . Pancreatic cancer Father 13  . Pneumonia Mother 40    cause of death  . Heart attack Neg Hx   . Stroke Daughter   . Hypertension Daughter     Danley Danker Vitals:   06/23/15 1350  BP: 128/58  Pulse: 77  Weight: 156 lb 4 oz (70.875 kg)  SpO2: 98%   Wt Readings from Last 3 Encounters:  06/23/15 156 lb 4 oz (70.875 kg)  06/10/15 154 lb (69.854 kg)  05/27/15 154 lb (69.854 kg)    PHYSICAL EXAM: General: Ambulated in the clinic with walker.  HEENT: Normal, without mass or lesion. Neck: Supple, no bruits. JVP 6-7 cm. Carotids 2+. No thyromegaly or nodule. Heart: PMI nondisplaced, Regular rate no s4.  no murmur. ICD site ok Lungs: Slightly diminished R base Abdomen: Soft, NT, ND, no HSM +BS x 4.  Extremities: No clubbing, cyanosis. No ankle edema.   Left thigh musculature decreased compared to right.  Neuro: Alert and oriented X 3.  Moves all extremities spontaneously.  Psych: Affect pleaseant  ASSESSMENT & PLAN:  1. Chronic Systolic Heart failure - Echo 10/02/14 EF 15% Recent echo 2/17 35-40%. Off milrinone  -NYHA III .  Functionally seems a little better. Offered cardiac rehab however she declines  Volume status ok. Continue lasix 40 mg daily + 10 meq potassium daily . - Now s/p [employer] CRT-D placement. 01/16/15 -Continue hydralazine 25 mg tid - Continue digoxin 0.0625 mg daily . No bb with low output. Has been off ACE/ARB due to CKD - Continue corlanor 5 mg BID. BMET today  2. Recent PE/ DVT bilateral by [place] 08/20/2014 - On Xarelto - was 6 months on 02/20/15. Will switch to apixaban 2.5 bid for long-term prevention of DVT 3. CKD Stage 3  4. Hx of LBBB 5. Hypertension  - Stable. 6. Intolerance of amiodarone due to possible acute lung toxicity 7. H/O Bacteremia -  -Saw ID. Bcx negative. No further work-up at this time   Follow up in 6 weeks.   Tab Rylee NP-C  1:58 PM

## 2015-06-23 NOTE — Patient Instructions (Signed)
Lab today  Your physician recommends that you schedule a follow-up appointment in: 6 weeks

## 2015-06-26 ENCOUNTER — Telehealth (HOSPITAL_COMMUNITY): Payer: Self-pay | Admitting: *Deleted

## 2015-06-26 NOTE — Telephone Encounter (Signed)
Notes Recorded by Harvie Junior, CMA on 06/26/2015 at 4:31 PM Pt aware of lab results and on the lab schedule 07/11/15 at 12pm Notes Recorded by Jolaine Artist, MD on 06/25/2015 at 11:31 PM Recheck 2 weeks. Notes Recorded by Scarlette Calico, RN on 06/23/2015 at 4:31 PM CR slightly elevated, Labs reviewed by RN, will forward to MD for review       Ref Range 3d ago (06/23/15) 3mo ago (01/17/15) 73mo ago (12/24/14) 43mo ago (12/23/14) 8mo ago (12/22/14)   Sodium 135 - 145 mmol/L 139 140 140 137 136   Potassium 3.5 - 5.1 mmol/L 4.1 3.7 4.2 3.6 3.3 (L)   Chloride 101 - 111 mmol/L 100 (L) 101 98 (L) 98 (L) 96 (L)   CO2 22 - 32 mmol/L 30 30 32 30 32   Glucose, Bld 65 - 99 mg/dL 109 (H) 153 (H) 97 109 (H) 105 (H)   BUN 6 - 20 mg/dL 34 (H) 16 14 14 15    Creatinine, Ser 0.44 - 1.00 mg/dL 1.61 (H) 1.34 (H) 1.13 (H) 1.20 (H) 1.25 (H)   Calcium 8.9 - 10.3 mg/dL 9.9 9.3 9.6 8.9 8.8 (L)   GFR calc non Af Amer >60 mL/min 28 (L) 60 mL/min" class="rz_1r" style="cursor: pointer; background-color: rgb(228, 223, 214);" onmouseover='jscript: var varStyle="underline"; var bgColor="#DCD7CE"; this.style.backgroundColor=bgColor; var children=this.getElementsByTagName("div"); for(var child=0;child 35 (L) 60 mL/min" class="rz_1r" style="cursor: pointer;" onmouseover='jscript: var varStyle="underline"; var bgColor="#DCD7CE"; this.style.backgroundColor=bgColor; var children=this.getElementsByTagName("div"); for(var child=0;child 43 (L) 60 mL/min" class="rz_1r" style="cursor: pointer;" onmouseover='jscript: var varStyle="underline"; var bgColor="#DCD7CE"; this.style.backgroundColor=bgColor; var children=this.getElementsByTagName("div"); for(var child=0;child 40 (L) 60 mL/min" class="rz_1s" style="cursor: pointer;" onmouseover='jscript: var varStyle="underline"; var bgColor="#DCD7CE"; this.style.backgroundColor=bgColor; var children=this.getElementsByTagName("div"); for(var child=0;child 38 (L)   GFR calc Af Amer >60 mL/min 32  (L) 60 mL/min" class="rz_1r" style="cursor: pointer; background-color: rgb(228, 223, 214);" onmouseover='jscript: var varStyle="underline"; var bgColor="#DCD7CE"; this.style.backgroundColor=bgColor; var children=this.getElementsByTagName("div"); for(var child=0;child 41 (L)CM 60 mL/min" class="rz_1r" style="cursor: pointer;" onmouseover='jscript: var varStyle="underline"; var bgColor="#DCD7CE"; this.style.backgroundColor=bgColor; var children=this.getElementsByTagName("div"); for(var child=0;child 50 (L)CM 60 mL/min" class="rz_1r" style="cursor: pointer;" onmouseover='jscript: var varStyle="underline"; var bgColor="#DCD7CE"; this.style.backgroundColor=bgColor; var children=this.getElementsByTagName("div"); for(var child=0;child 46 (L)CM 60 mL/min" class="rz_1s" style="cursor: pointer;" onmouseover='jscript: var varStyle="underline"; var bgColor="#DCD7CE"; this.style.backgroundColor=bgColor; var children=this.getElementsByTagName("div"); for(var child=0;child 44 (L)CM  Comments: (NOTE)

## 2015-07-01 ENCOUNTER — Encounter: Payer: Self-pay | Admitting: *Deleted

## 2015-07-01 ENCOUNTER — Other Ambulatory Visit: Payer: Self-pay | Admitting: *Deleted

## 2015-07-01 NOTE — Patient Outreach (Signed)
[facility] ([facility]) Care Management  07/01/2015  Alexandra [name] 11/26/1927 [id number]   S:  Pt is doing well. She no longer has an overnight sitter and is doing well on her own. She does have someone else that comes a few hours several times a week. She saw Dr. Benshimon on 06/23/15. They did labs and noted her BUN and creatine were elevated more than previous testing and she is to go back for reassessment next week. [facility] Pharmacist has advised pt she can apply for pharmacy assistance for her Eliquis. Her daughter is to call the pharmacist with her financial information.  O:  BP 110/50 mmHg  Pulse 79  Resp 16  Wt 156 lb (70.761 kg)  SpO2 96%        RRR       Lungs are clear       L ankle measurement: 20.5 per pt request to measure.        R ankle measurement: 21.5  A:  CHF      Chronic hip pain  P:  Discussed relationship between HF and CKD, medications and lab work.       Suggest she see her orthopedist about her hip pain. She does not want to pursue this at this time.       I will return in one month for follow up. Pt reminded to call me for any problems.  [facility] CM Care Plan Problem One        Most Recent Value   Care Plan Problem One  CHF   Role Documenting the Problem One  Care Management Coordinator   Care Plan for Problem One  Active   [facility] Long Term Goal (31-90 days)  Pt will not admit to the hospital for CHF in the next 90 days.   [facility] Long Term Goal Start Date  05/27/15   Interventions for Problem One Long Term Goal  Pt following daily management for CHF.   [facility] CM Short Term Goal #1 (0-30 days)  Pt will call me if she goes into the YELLOW ZONE for intervention to avoid complications or hospitalization over the next 30 days..   [facility] CM Short Term Goal #1 Start Date  05/27/15   Interventions for Short Term Goal #1  Has not been in the Yellow Zone.    [facility] CM Care Plan Problem Two        Most Recent Value   Care Plan Problem Two  Pt will weigh daily, take her meds as  ordered, take her BP, and exercise 10 minutes daily over the next 30 days.   Role Documenting the Problem Two  Care Management Coordinator   Care Plan for Problem Two  Active   [facility] CM Short Term Goal #1 Start Date  05/27/15   [facility] CM Short Term Goal #1 Met Date   07/01/15   Interventions for Short Term Goal #2   Encouraged to participate in the cardiac rehab program. I will call so someone from cardiac rehab will call pt to explain service.     Carroll Spinks GNP-BC [facility] Care Manager [phone]       

## 2015-07-07 ENCOUNTER — Other Ambulatory Visit: Payer: Self-pay | Admitting: Pharmacist

## 2015-07-07 ENCOUNTER — Encounter: Payer: Self-pay | Admitting: Pharmacist

## 2015-07-07 ENCOUNTER — Other Ambulatory Visit (HOSPITAL_COMMUNITY): Payer: Self-pay | Admitting: *Deleted

## 2015-07-07 DIAGNOSIS — I5022 Chronic systolic (congestive) heart failure: Secondary | ICD-10-CM

## 2015-07-07 MED ORDER — CITALOPRAM HYDROBROMIDE 20 MG PO TABS: 20.0000 mg | ORAL_TABLET | Freq: Every day | ORAL | Status: DC

## 2015-07-07 NOTE — Patient Outreach (Signed)
[facility]) Care Management  07/07/2015  Alexandra [name] 1927/09/19 [id number]  Follow-up call to patient regarding patient interest in applying to [employer] patient assistance foundation to attempt to gain assistance with Eliquis.  Patient verified name, address, date of birth.    Discussed income requirement that program website publishes as well as program's requirement that Medicare Part D Beneficiaries spend at least 3% of household income on out-of-pocket drug costs.   Offered to mail patient the application so she can review it and decide if she would like to apply with her daughter whom she reports she needs to discuss it with.   Plan:   Will mail patient copy of [place] patient assistance foundation application.    Will follow-up with patient in about 2 weeks via phone if no response received from patient by that time.    Karrie Meres, PharmD, [facility] [phone]

## 2015-07-08 ENCOUNTER — Other Ambulatory Visit (HOSPITAL_COMMUNITY): Payer: Self-pay | Admitting: *Deleted

## 2015-07-11 ENCOUNTER — Ambulatory Visit (HOSPITAL_COMMUNITY)
Admission: RE | Admit: 2015-07-11 | Discharge: 2015-07-11 | Disposition: A | Discharge status: Home / Self Care | Payer: Commercial Managed Care - HMO | Source: Ambulatory Visit | Attending: Cardiology | Admitting: Cardiology

## 2015-07-11 DIAGNOSIS — I5022 Chronic systolic (congestive) heart failure: Secondary | ICD-10-CM | POA: Diagnosis not present

## 2015-07-11 LAB — BASIC METABOLIC PANEL
Anion gap: 9 (ref 5–15)
BUN: 27 mg/dL — AB (ref 6–20)
CHLORIDE: 97 mmol/L — AB (ref 101–111)
CO2: 32 mmol/L (ref 22–32)
Calcium: 10 mg/dL (ref 8.9–10.3)
Creatinine, Ser: 1.65 mg/dL — ABNORMAL HIGH (ref 0.44–1.00)
GFR calc Af Amer: 31 mL/min — ABNORMAL LOW (ref >60)
GFR calc non Af Amer: 27 mL/min — ABNORMAL LOW (ref >60)
GLUCOSE: 79 mg/dL (ref 65–99)
POTASSIUM: 4 mmol/L (ref 3.5–5.1)
Sodium: 138 mmol/L (ref 135–145)

## 2015-07-16 ENCOUNTER — Encounter (HOSPITAL_COMMUNITY): Payer: Self-pay

## 2015-07-16 ENCOUNTER — Other Ambulatory Visit (HOSPITAL_COMMUNITY): Payer: Self-pay

## 2015-07-16 ENCOUNTER — Encounter (HOSPITAL_COMMUNITY): Payer: Self-pay | Admitting: Internal Medicine

## 2015-07-16 ENCOUNTER — Ambulatory Visit (HOSPITAL_COMMUNITY)
Admission: RE | Admit: 2015-07-16 | Discharge: 2015-07-16 | Disposition: A | Discharge status: Home / Self Care | Payer: Commercial Managed Care - HMO | Source: Ambulatory Visit | Attending: Internal Medicine | Admitting: Internal Medicine

## 2015-07-16 ENCOUNTER — Encounter: Payer: Self-pay | Admitting: Internal Medicine

## 2015-07-16 VITALS — BP 144/66 | HR 78 | Ht 65.0 in | Wt 154.0 lb

## 2015-07-16 DIAGNOSIS — M1990 Unspecified osteoarthritis, unspecified site: Secondary | ICD-10-CM | POA: Insufficient documentation

## 2015-07-16 DIAGNOSIS — Z7901 Long term (current) use of anticoagulants: Secondary | ICD-10-CM | POA: Diagnosis not present

## 2015-07-16 DIAGNOSIS — J45909 Unspecified asthma, uncomplicated: Secondary | ICD-10-CM | POA: Insufficient documentation

## 2015-07-16 DIAGNOSIS — D638 Anemia in other chronic diseases classified elsewhere: Secondary | ICD-10-CM | POA: Insufficient documentation

## 2015-07-16 DIAGNOSIS — Z853 Personal history of malignant neoplasm of breast: Secondary | ICD-10-CM | POA: Diagnosis not present

## 2015-07-16 DIAGNOSIS — N183 Chronic kidney disease, stage 3 (moderate): Secondary | ICD-10-CM | POA: Diagnosis not present

## 2015-07-16 DIAGNOSIS — M8580 Other specified disorders of bone density and structure, unspecified site: Secondary | ICD-10-CM | POA: Diagnosis not present

## 2015-07-16 DIAGNOSIS — Z86711 Personal history of pulmonary embolism: Secondary | ICD-10-CM | POA: Diagnosis not present

## 2015-07-16 DIAGNOSIS — Z86718 Personal history of other venous thrombosis and embolism: Secondary | ICD-10-CM | POA: Diagnosis not present

## 2015-07-16 DIAGNOSIS — F329 Major depressive disorder, single episode, unspecified: Secondary | ICD-10-CM | POA: Insufficient documentation

## 2015-07-16 DIAGNOSIS — I447 Left bundle-branch block, unspecified: Secondary | ICD-10-CM | POA: Insufficient documentation

## 2015-07-16 DIAGNOSIS — I471 Supraventricular tachycardia: Secondary | ICD-10-CM | POA: Diagnosis not present

## 2015-07-16 DIAGNOSIS — R0600 Dyspnea, unspecified: Secondary | ICD-10-CM

## 2015-07-16 DIAGNOSIS — I5022 Chronic systolic (congestive) heart failure: Secondary | ICD-10-CM | POA: Insufficient documentation

## 2015-07-16 DIAGNOSIS — E7800 Pure hypercholesterolemia, unspecified: Secondary | ICD-10-CM | POA: Insufficient documentation

## 2015-07-16 DIAGNOSIS — R000 Tachycardia, unspecified: Secondary | ICD-10-CM | POA: Diagnosis not present

## 2015-07-16 DIAGNOSIS — E039 Hypothyroidism, unspecified: Secondary | ICD-10-CM | POA: Insufficient documentation

## 2015-07-16 DIAGNOSIS — Z950 Presence of cardiac pacemaker: Secondary | ICD-10-CM | POA: Insufficient documentation

## 2015-07-16 DIAGNOSIS — F419 Anxiety disorder, unspecified: Secondary | ICD-10-CM | POA: Insufficient documentation

## 2015-07-16 DIAGNOSIS — E669 Obesity, unspecified: Secondary | ICD-10-CM | POA: Insufficient documentation

## 2015-07-16 DIAGNOSIS — I130 Hypertensive heart and chronic kidney disease with heart failure and stage 1 through stage 4 chronic kidney disease, or unspecified chronic kidney disease: Secondary | ICD-10-CM | POA: Insufficient documentation

## 2015-07-16 NOTE — Addendum Note (Signed)
Encounter addended by: Effie Berkshire, RN on: 07/16/2015  3:19 PM<BR>     Documentation filed: Patient Instructions Section

## 2015-07-16 NOTE — Patient Instructions (Addendum)
You have been scheduled for a right heart catheterization. See attached instruction sheet for additional details.  Follow up with Dr. Haroldine Laws in 3-4 weeks.  Do the following things EVERYDAY: 1) Weigh yourself in the morning before breakfast. Write it down and keep it in a log. 2) Take your medicines as prescribed 3) Eat low salt foods-Limit salt (sodium) to 2000 mg per day.  4) Stay as active as you can everyday 5) Limit all fluids for the day to less than 2 liters

## 2015-07-16 NOTE — Progress Notes (Signed)
Patient ID: Alexandra [name], female   DOB: [birth year], 82 y.o.   MRN: [id number] Patient ID: Alexandra [name], female   DOB: February 15, 1927, 82 y.o.   MRN: [id number]     Advanced Heart Failure Clinic Note   Primary Care: Dr. Melinda Crutch Primary Cardiologist: Dr Candee Furbish Primary HF: Dr Haroldine Laws  HPI: Alexandra [name] is a 82 y.o. 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 Discharge weight was 196.  She was admitted in July 2016 for Abdominal pain and SOB. Her Gallbladder was thought to be causing her pain, so a lap choley was performed that admission. Echo on 08/15/14 showed EF 20%. There were thoughts that she may have stress induced CMP due to the death of her husband in 05/17/22 after several months of hospice care.   She was directly admitted to [facility] on 10/11/14 with concerns for low output with SBPs in 80s and tachycardia.  PICC line was placed with initial co-ox of 61%.CVP was 15. Milrinone started when co-ox dropped to 53%. She had SVT on milrinone 0.25 so started on amio. Developed acute SOB that improved quickly with cessation of amio and extra dose of IV lasix. Felt to possibly have acute amio toxicity. Milrinone decreased to 0.125 and no further PSVT. Sent home on milrinone 0.125. Diuresed well on 80 mg lasix IB BID. Discharge weight was 170.  Admitted 12/1 through 12/6 with PICC line infection. Bcx with [facility] 2/2 with for diptheroids. Bcx 1/2 at [facility] G[facility]-Bacteremia-cornybacterium . PICC line replaced. Completed antibiotic course. She continued on milrinone 0.125 mcg. Started ivabradine.  Placement of CRT delayed.  S/p CRT-D placement 01/16/15 with Dr Caryl Comes.   Labs 01/29/15  with WBCs trending up 7.9 -> 10 -> 12. BCx ordered with [facility]. On 01/31/15 [facility] with 1/2 BCX + GPC  clusters. (I do not have final results from them) Refused admission. Treated with home vancomycin for 1 week. PICC line not changed.   She returns today for regular follow up. Milrinone stopped in February 2017. Says she feels terrible. But daughter clarifies that she is much more active during day able to do more activities. She says that she is very tired with all the activities and afterwards. Weight stable. No orthopnea or PND.    ECHO 10/02/14 LVEF 15% with diffuse hypokinesis, RV mildly dilated, normal function, PA peak pressure 59 mmHg Echo 2/17 EF 40-45%  Labs 3/17: Hgb 10.8, K 4.6 Cr 1.38   Past Medical History  Diagnosis Date  . Arthritis   . Headache(784.0)   . Anxiety   . Depression   . Asthma   . CAP (community acquired pneumonia) 12/19/2012  . Obesity   . Peripheral neuropathy ([place])   . Sacroiliitis, not elsewhere classified ([place])   . Osteopenia   . Hypercholesteremia   . Neuropathy ([place])   . Trigger finger     [name]  . Chest pain     NM stress test 05/2011 Normal  . DOE (dyspnea on exertion)     No SOB at rest  . Fatigue     excertional  . breast ca dx'd 2000    left  . Essential (primary) hypertension 04/02/2014  . Adult hypothyroidism 04/02/2014  . Hereditary and idiopathic peripheral neuropathy 07/02/2014  .  Leg weakness   . CKD (chronic kidney disease) stage 3, GFR 30-59 ml/min 08/13/2014  . Anemia of chronic disease 08/13/2014  . CHF (congestive heart failure) ([place]) 08/19/2014  . Situational depression   . Presence of permanent cardiac pacemaker 01/16/2015    BIV  . Intermittent LBBB (left bundle branch block) 12/21/2012    Current Outpatient Prescriptions  Medication Sig Dispense Refill  . acetaminophen (TYLENOL) 500 MG tablet Take 500 mg by mouth every 6 (six) hours as needed for mild pain or headache. Reported on 06/17/2015    . albuterol (PROVENTIL HFA;VENTOLIN HFA) 108 (90 BASE) MCG/ACT inhaler Inhale 2 puffs into the lungs every 6 (six) hours as  needed for wheezing or shortness of breath. Reported on 05/27/2015    . ALPRAZolam (XANAX) 0.25 MG tablet Take 0.25 mg by mouth 3 (three) times daily as needed for anxiety. Reported on 05/27/2015    . apixaban (ELIQUIS) 2.5 MG TABS tablet Take 1 tablet (2.5 mg total) by mouth 2 (two) times daily. 60 tablet 6  . b complex vitamins tablet Take 1 tablet by mouth daily with lunch.     . calcium carbonate (TUMS - DOSED IN MG ELEMENTAL CALCIUM) 500 MG chewable tablet Chew 2 tablets by mouth daily as needed for indigestion or heartburn. Reported on 07/01/2015    . Calcium Carbonate-Vitamin D (CALTRATE 600+D PO) Take 1 tablet by mouth daily with lunch.    . cholecalciferol (VITAMIN D) 1000 UNITS tablet Take 1,000 Units by mouth daily with lunch.     . citalopram (CELEXA) 20 MG tablet Take 1 tablet (20 mg total) by mouth daily. 30 tablet 6  . CORLANOR 5 MG TABS tablet TAKE 1 TABLET BY MOUTH TWICE DAILY WITH A MEAL.. 60 tablet 3  . DIGOX 125 MCG tablet TAKE 1/2 TABLET BY MOUTH EVERY DAY 15 tablet 4  . furosemide (LASIX) 40 MG tablet TAKE 1 TABLET BY MOUTH DAILY 30 tablet 4  . hydrALAZINE (APRESOLINE) 25 MG tablet Take 1 tablet (25 mg total) by mouth 3 (three) times daily. 90 tablet 3  . lactose free nutrition (BOOST) LIQD Take 237 mLs by mouth daily with breakfast.     . levothyroxine (SYNTHROID, LEVOTHROID) 25 MCG tablet Take 25 mcg by mouth daily before breakfast. for thyroid    . MAGNESIUM-OXIDE 400 (241.3 Mg) MG tablet TAKE 1/2 TABLET BY MOUTH EVERY DAY 15 tablet 0  . Multiple Vitamin (MULITIVITAMIN WITH MINERALS) TABS Take 1 tablet by mouth daily with lunch.     . nitroGLYCERIN (NITROSTAT) 0.4 MG SL tablet Place 1 tablet (0.4 mg total) under the tongue every 5 (five) minutes as needed for chest pain. 25 tablet 4  . pantoprazole (PROTONIX) 40 MG tablet Take 40 mg by mouth daily. Reported on 07/01/2015    . Polyethyl Glycol-Propyl Glycol (SYSTANE OP) Place 1 drop into both eyes 2 (two) times daily.    .  potassium chloride (K-DUR) 10 MEQ tablet TAKE 1 TABLET BY MOUTH DAILY 30 tablet 3  . spironolactone (ALDACTONE) 25 MG tablet TAKE 1 TABLET BY MOUTH DAILY 30 tablet 4   No current facility-administered medications for this encounter.    Allergies  Allergen Reactions  . Amiodarone Shortness Of Breath and Nausea And Vomiting  . Cymbalta [Duloxetine Hcl] Other (See Comments)    Depression  . Lyrica [Pregabalin] Other (See Comments)    Blurry vision      Social History   Social History  . Marital Status: Married  Spouse Name: N/A  . Number of Children: 5  . Years of Education: HS   Occupational History  . Retired    Social History Main Topics  . Smoking status: Never Smoker   . Smokeless tobacco: Never Used  . Alcohol Use: No  . Drug Use: No  . Sexual Activity: No   Other Topics Concern  . Not on file   Social History Narrative   Lives at home alone.   Right-handed.   Two cups caffeine daily (coffee).      Family History  Problem Relation Age of Onset  . Diabetes Brother   . Alzheimer's disease Sister   . CVA Daughter     01/21/09  . Pancreatic cancer Father 67  . Pneumonia Mother 59    cause of death  . Heart attack Neg Hx   . Stroke Daughter   . Hypertension Daughter     Danley Danker Vitals:   07/16/15 1428  BP: 144/66  Pulse: 78  Height: 5\' 5"  (1.651 m)  Weight: 154 lb (69.854 kg)  SpO2: 97%   Wt Readings from Last 3 Encounters:  07/16/15 154 lb (69.854 kg)  07/01/15 156 lb (70.761 kg)  06/23/15 156 lb 4 oz (70.875 kg)    PHYSICAL EXAM: General: In wheelchair, Daughter present.  HEENT: Normal, without mass or lesion. Neck: Supple, no bruits. JVP 6-7 cm. Carotids 2+. No thyromegaly or nodule. Heart: PMI nondisplaced, Regular rate no s4.  no murmur. ICD site ok Lungs: Slightly diminished R base Abdomen: Soft, NT, ND, no HSM +BS x 4.  Extremities: No clubbing, cyanosis. No ankle edema.  LUE PICC site clean, dry, and intact. Left thigh  musculature decreased compared to right.  Neuro: Alert and oriented X 3. Moves all extremities spontaneously.  Psych: Affect pleaseant  ASSESSMENT & PLAN:  1. Chronic Systolic Heart failure - Echo 10/02/14 EF 15% Recent echo 2/17 read as 40-45% I reviewed probably closer to 35%. Off milrinone  - She is worse NYHA IIIB. Volume status ok. Will repeat [facility] to assess need for restarting milrinone. - Now s/p St Jude CRT-D placement. 01/16/15 - Continue hydralazine 25 mg tid - Continue digoxin 0.0625 mg daily . No bb with low output. Has been off ACE/ARB due to CKD - Continue lasix 40 mg daily + 10 meq potassium daily .   - Continue corlanor 5 mg BID.  2. H/o PE/ DVT bilateral by [place] 08/20/2014 - Switched to apixaban 2.5 bid for long-term prevention of DVT 3. CKD Stage 3  - Stable on recent labs via [facility].  4. Hx of LBBB 5. Hypertension  - Stable. 6. Intolerance of amiodarone due to possible acute lung toxicity 7. H/O Bacteremia -  -Saw ID. Bcx negative. No further work-up at this time   Charisma Charlot,MD 2:55 PM

## 2015-07-19 ENCOUNTER — Other Ambulatory Visit (HOSPITAL_COMMUNITY): Payer: Self-pay | Admitting: Student

## 2015-07-21 ENCOUNTER — Encounter (HOSPITAL_COMMUNITY): Payer: Self-pay | Admitting: Internal Medicine

## 2015-07-21 ENCOUNTER — Ambulatory Visit (HOSPITAL_COMMUNITY)
Admission: RE | Admit: 2015-07-21 | Discharge: 2015-07-21 | Disposition: A | Discharge status: Home / Self Care | Payer: Commercial Managed Care - HMO | Source: Ambulatory Visit | Attending: Internal Medicine | Admitting: Internal Medicine

## 2015-07-21 ENCOUNTER — Other Ambulatory Visit (HOSPITAL_COMMUNITY): Payer: Self-pay | Admitting: Cardiology

## 2015-07-21 ENCOUNTER — Encounter (HOSPITAL_COMMUNITY)
Admission: RE | Disposition: A | Discharge status: Home / Self Care | Payer: Self-pay | Source: Ambulatory Visit | Attending: Internal Medicine

## 2015-07-21 DIAGNOSIS — Z7901 Long term (current) use of anticoagulants: Secondary | ICD-10-CM | POA: Insufficient documentation

## 2015-07-21 DIAGNOSIS — D631 Anemia in chronic kidney disease: Secondary | ICD-10-CM | POA: Diagnosis not present

## 2015-07-21 DIAGNOSIS — F419 Anxiety disorder, unspecified: Secondary | ICD-10-CM | POA: Insufficient documentation

## 2015-07-21 DIAGNOSIS — Z950 Presence of cardiac pacemaker: Secondary | ICD-10-CM | POA: Insufficient documentation

## 2015-07-21 DIAGNOSIS — F329 Major depressive disorder, single episode, unspecified: Secondary | ICD-10-CM | POA: Diagnosis not present

## 2015-07-21 DIAGNOSIS — M1990 Unspecified osteoarthritis, unspecified site: Secondary | ICD-10-CM | POA: Insufficient documentation

## 2015-07-21 DIAGNOSIS — I447 Left bundle-branch block, unspecified: Secondary | ICD-10-CM | POA: Diagnosis not present

## 2015-07-21 DIAGNOSIS — Z853 Personal history of malignant neoplasm of breast: Secondary | ICD-10-CM | POA: Diagnosis not present

## 2015-07-21 DIAGNOSIS — Z6826 Body mass index (BMI) 26.0-26.9, adult: Secondary | ICD-10-CM | POA: Diagnosis not present

## 2015-07-21 DIAGNOSIS — M8580 Other specified disorders of bone density and structure, unspecified site: Secondary | ICD-10-CM | POA: Diagnosis not present

## 2015-07-21 DIAGNOSIS — J45909 Unspecified asthma, uncomplicated: Secondary | ICD-10-CM | POA: Diagnosis not present

## 2015-07-21 DIAGNOSIS — E039 Hypothyroidism, unspecified: Secondary | ICD-10-CM | POA: Insufficient documentation

## 2015-07-21 DIAGNOSIS — Z86711 Personal history of pulmonary embolism: Secondary | ICD-10-CM | POA: Diagnosis not present

## 2015-07-21 DIAGNOSIS — I130 Hypertensive heart and chronic kidney disease with heart failure and stage 1 through stage 4 chronic kidney disease, or unspecified chronic kidney disease: Secondary | ICD-10-CM | POA: Diagnosis not present

## 2015-07-21 DIAGNOSIS — N183 Chronic kidney disease, stage 3 (moderate): Secondary | ICD-10-CM | POA: Insufficient documentation

## 2015-07-21 DIAGNOSIS — Z86718 Personal history of other venous thrombosis and embolism: Secondary | ICD-10-CM | POA: Insufficient documentation

## 2015-07-21 DIAGNOSIS — G609 Hereditary and idiopathic neuropathy, unspecified: Secondary | ICD-10-CM | POA: Insufficient documentation

## 2015-07-21 DIAGNOSIS — R0600 Dyspnea, unspecified: Secondary | ICD-10-CM | POA: Insufficient documentation

## 2015-07-21 DIAGNOSIS — E669 Obesity, unspecified: Secondary | ICD-10-CM | POA: Insufficient documentation

## 2015-07-21 DIAGNOSIS — I471 Supraventricular tachycardia: Secondary | ICD-10-CM | POA: Diagnosis not present

## 2015-07-21 DIAGNOSIS — Z833 Family history of diabetes mellitus: Secondary | ICD-10-CM | POA: Insufficient documentation

## 2015-07-21 DIAGNOSIS — Z823 Family history of stroke: Secondary | ICD-10-CM | POA: Insufficient documentation

## 2015-07-21 DIAGNOSIS — E7800 Pure hypercholesterolemia, unspecified: Secondary | ICD-10-CM | POA: Diagnosis not present

## 2015-07-21 DIAGNOSIS — I5022 Chronic systolic (congestive) heart failure: Secondary | ICD-10-CM | POA: Insufficient documentation

## 2015-07-21 HISTORY — PX: CARDIAC CATHETERIZATION: SHX172

## 2015-07-21 LAB — POCT I-STAT 3, VENOUS BLOOD GAS (G3P V)
ACID-BASE EXCESS: 5 mmol/L — AB (ref 0.0–2.0)
ACID-BASE EXCESS: 7 mmol/L — AB (ref 0.0–2.0)
Acid-Base Excess: 8 mmol/L — ABNORMAL HIGH (ref 0.0–2.0)
BICARBONATE: 30.7 meq/L — AB (ref 20.0–24.0)
BICARBONATE: 32.3 meq/L — AB (ref 20.0–24.0)
Bicarbonate: 33.7 mEq/L — ABNORMAL HIGH (ref 20.0–24.0)
O2 SAT: 68 %
O2 SAT: 70 %
O2 SAT: 71 %
PCO2 VEN: 49.7 mmHg (ref 45.0–50.0)
PO2 VEN: 35 mmHg (ref 31.0–45.0)
PO2 VEN: 37 mmHg (ref 31.0–45.0)
TCO2: 32 mmol/L (ref 0–100)
TCO2: 34 mmol/L (ref 0–100)
TCO2: 35 mmol/L (ref 0–100)
pCO2, Ven: 48.5 mmHg (ref 45.0–50.0)
pCO2, Ven: 49.1 mmHg (ref 45.0–50.0)
pH, Ven: 7.41 — ABNORMAL HIGH (ref 7.250–7.300)
pH, Ven: 7.427 — ABNORMAL HIGH (ref 7.250–7.300)
pH, Ven: 7.439 — ABNORMAL HIGH (ref 7.250–7.300)
pO2, Ven: 37 mmHg (ref 31.0–45.0)

## 2015-07-21 LAB — CBC
HEMATOCRIT: 34 % — AB (ref 36.0–46.0)
HEMOGLOBIN: 10.5 g/dL — AB (ref 12.0–15.0)
MCH: 29.8 pg (ref 26.0–34.0)
MCHC: 30.9 g/dL (ref 30.0–36.0)
MCV: 96.6 fL (ref 78.0–100.0)
Platelets: 201 10*3/uL (ref 150–400)
RBC: 3.52 MIL/uL — ABNORMAL LOW (ref 3.87–5.11)
RDW: 15.2 % (ref 11.5–15.5)
WBC: 6.4 10*3/uL (ref 4.0–10.5)

## 2015-07-21 LAB — BASIC METABOLIC PANEL
ANION GAP: 7 (ref 5–15)
BUN: 29 mg/dL — ABNORMAL HIGH (ref 6–20)
CHLORIDE: 101 mmol/L (ref 101–111)
CO2: 31 mmol/L (ref 22–32)
Calcium: 9.5 mg/dL (ref 8.9–10.3)
Creatinine, Ser: 1.55 mg/dL — ABNORMAL HIGH (ref 0.44–1.00)
GFR calc Af Amer: 34 mL/min — ABNORMAL LOW (ref >60)
GFR, EST NON AFRICAN AMERICAN: 29 mL/min — AB (ref >60)
GLUCOSE: 99 mg/dL (ref 65–99)
POTASSIUM: 3.8 mmol/L (ref 3.5–5.1)
SODIUM: 139 mmol/L (ref 135–145)

## 2015-07-21 LAB — PROTIME-INR
INR: 1.39 (ref 0.00–1.49)
Prothrombin Time: 17.2 seconds — ABNORMAL HIGH (ref 11.6–15.2)

## 2015-07-21 SURGERY — RIGHT HEART CATH

## 2015-07-21 MED ORDER — SODIUM CHLORIDE 0.9% FLUSH: 3.0000 mL | Freq: Two times a day (BID) | INTRAVENOUS | Status: DC

## 2015-07-21 MED ORDER — SODIUM CHLORIDE 0.9 % IV SOLN: 250.0000 mL | INTRAVENOUS | Status: DC | PRN

## 2015-07-21 MED ORDER — HEPARIN (PORCINE) IN NACL 2-0.9 UNIT/ML-% IJ SOLN
INTRAMUSCULAR | Status: AC
  Filled 2015-07-21: qty 500

## 2015-07-21 MED ORDER — SODIUM CHLORIDE 0.9 % IV SOLN
250.0000 mL | INTRAVENOUS | Status: DC | PRN
Start: 2015-07-21 — End: 2015-07-21

## 2015-07-21 MED ORDER — FENTANYL CITRATE (PF) 100 MCG/2ML IJ SOLN
INTRAMUSCULAR | Status: AC
  Filled 2015-07-21: qty 2

## 2015-07-21 MED ORDER — SODIUM CHLORIDE 0.9% FLUSH: 3.0000 mL | INTRAVENOUS | Status: DC | PRN

## 2015-07-21 MED ORDER — MIDAZOLAM HCL 2 MG/2ML IJ SOLN
INTRAMUSCULAR | Status: AC
  Filled 2015-07-21: qty 2

## 2015-07-21 MED ORDER — FENTANYL CITRATE (PF) 100 MCG/2ML IJ SOLN
INTRAMUSCULAR | Status: DC | PRN
  Administered 2015-07-21: 25 ug via INTRAVENOUS

## 2015-07-21 MED ORDER — SODIUM CHLORIDE 0.9 % IV SOLN
INTRAVENOUS | Status: DC
  Administered 2015-07-21: 08:00:00 via INTRAVENOUS

## 2015-07-21 MED ORDER — MAGNESIUM OXIDE 400 (241.3 MG) MG PO TABS: 0.5000 | ORAL_TABLET | Freq: Every day | ORAL | Status: DC

## 2015-07-21 MED ORDER — LIDOCAINE HCL (PF) 1 % IJ SOLN
INTRAMUSCULAR | Status: AC
  Filled 2015-07-21: qty 30

## 2015-07-21 MED ORDER — ACETAMINOPHEN 325 MG PO TABS: 650.0000 mg | ORAL_TABLET | ORAL | Status: DC | PRN

## 2015-07-21 MED ORDER — HEPARIN (PORCINE) IN NACL 2-0.9 UNIT/ML-% IJ SOLN
INTRAMUSCULAR | Status: DC | PRN
  Administered 2015-07-21: 500 mL

## 2015-07-21 MED ORDER — ONDANSETRON HCL 4 MG/2ML IJ SOLN: 4.0000 mg | Freq: Four times a day (QID) | INTRAMUSCULAR | Status: DC | PRN

## 2015-07-21 MED ORDER — MIDAZOLAM HCL 2 MG/2ML IJ SOLN
INTRAMUSCULAR | Status: DC | PRN
  Administered 2015-07-21: 1 mg via INTRAVENOUS

## 2015-07-21 MED ORDER — LIDOCAINE HCL (PF) 1 % IJ SOLN
INTRAMUSCULAR | Status: DC | PRN
  Administered 2015-07-21: 1 mL

## 2015-07-21 SURGICAL SUPPLY — 5 items
CATH BALLN WEDGE 5F 110CM (CATHETERS) ×1 IMPLANT
GUIDEWIRE .025 260CM (WIRE) ×1 IMPLANT
PACK CARDIAC CATHETERIZATION (CUSTOM PROCEDURE TRAY) ×2 IMPLANT
SHEATH FAST CATH BRACH 5F 5CM (SHEATH) ×1 IMPLANT
TRANSDUCER W/STOPCOCK (MISCELLANEOUS) ×3 IMPLANT

## 2015-07-21 NOTE — Interval H&P Note (Signed)
History and Physical Interval Note:  07/21/2015 9:56 AM  Alexandra [name]  has presented today for surgery, with the diagnosis of chf  The various methods of treatment have been discussed with the patient and family. After consideration of risks, benefits and other options for treatment, the patient has consented to  Procedure(s): Right Heart Cath and Coronary Angiography (N/A) as a surgical intervention .  The patient's history has been reviewed, patient examined, no change in status, stable for surgery.  I have reviewed the patient's chart and labs.  Questions were answered to the patient's satisfaction.     Hughey Rittenberry, Quillian Quince

## 2015-07-21 NOTE — Discharge Instructions (Signed)
Angiogram, Care After °Refer to this sheet in the next few weeks. These instructions provide you with information about caring for yourself after your procedure. Your health care provider may also give you more specific instructions. Your treatment has been planned according to current medical practices, but problems sometimes occur. Call your health care provider if you have any problems or questions after your procedure. °WHAT TO EXPECT AFTER THE PROCEDURE °After your procedure, it is typical to have the following: °· Bruising at the catheter insertion site that usually fades within 1-2 weeks. °· Blood collecting in the tissue (hematoma) that may be painful to the touch. It should usually decrease in size and tenderness within 1-2 weeks. °HOME CARE INSTRUCTIONS °· Take medicines only as directed by your health care provider. °· You may shower 24-48 hours after the procedure or as directed by your health care provider. Remove the bandage (dressing) and gently wash the site with plain soap and water. Pat the area dry with a clean towel. Do not rub the site, because this may cause bleeding. °· Do not take baths, swim, or use a hot tub until your health care provider approves. °· Check your insertion site every day for redness, swelling, or drainage. °· Do not apply powder or lotion to the site. °· Do not lift over 10 lb (4.5 kg) for 5 days after your procedure or as directed by your health care provider. °· Ask your health care provider when it is okay to: °¨ Return to work or school. °¨ Resume usual physical activities or sports. °¨ Resume sexual activity. °· Do not drive home if you are discharged the same day as the procedure. Have someone else drive you. °· You may drive 24 hours after the procedure unless otherwise instructed by your health care provider. °· Do not operate machinery or power tools for 24 hours after the procedure or as directed by your health care provider. °· If your procedure was done as an  outpatient procedure, which means that you went home the same day as your procedure, a responsible adult should be with you for the first 24 hours after you arrive home. °· Keep all follow-up visits as directed by your health care provider. This is important. °SEEK MEDICAL CARE IF: °· You have a fever. °· You have chills. °· You have increased bleeding from the catheter insertion site. Hold pressure on the site. °SEEK IMMEDIATE MEDICAL CARE IF: °· You have unusual pain at the catheter insertion site. °· You have redness, warmth, or swelling at the catheter insertion site. °· You have drainage (other than a small amount of blood on the dressing) from the catheter insertion site. °· The catheter insertion site is bleeding, and the bleeding does not stop after 30 minutes of holding steady pressure on the site. °· The area near or just beyond the catheter insertion site becomes pale, cool, tingly, or numb. °  °This information is not intended to replace advice given to you by your health care provider. Make sure you discuss any questions you have with your health care provider. °  °Document Released: 07/23/2004 Document Revised: 01/25/2014 Document Reviewed: 06/07/2012 °Elsevier Interactive Patient Education ©2016 Else[employer] Inc[employer] ° °

## 2015-07-21 NOTE — H&P (View-Only) (Signed)
Patient ID: Alexandra [name], female   DOB: [birth year], 82 y.o.   MRN: [id number] Patient ID: Alexandra [name], female   DOB: 06-16-1927, 82 y.o.   MRN: [id number]     Advanced Heart Failure Clinic Note   Primary Care: Dr. Melinda Crutch Primary Cardiologist: Dr Candee Furbish Primary HF: Dr Haroldine Laws  HPI: Alexandra [name] is a 82 y.o. 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 Discharge weight was 196.  She was admitted in July 2016 for Abdominal pain and SOB. Her Gallbladder was thought to be causing her pain, so a lap choley was performed that admission. Echo on 08/15/14 showed EF 20%. There were thoughts that she may have stress induced CMP due to the death of her husband in May 14, 2022 after several months of hospice care.   She was directly admitted to [facility] on 10/11/14 with concerns for low output with SBPs in 80s and tachycardia.  PICC line was placed with initial co-ox of 61%.CVP was 15. Milrinone started when co-ox dropped to 53%. She had SVT on milrinone 0.25 so started on amio. Developed acute SOB that improved quickly with cessation of amio and extra dose of IV lasix. Felt to possibly have acute amio toxicity. Milrinone decreased to 0.125 and no further PSVT. Sent home on milrinone 0.125. Diuresed well on 80 mg lasix IB BID. Discharge weight was 170.  Admitted 12/1 through 12/6 with PICC line infection. Bcx with [facility] 2/2 with for diptheroids. Bcx 1/2 at [facility]-Bacteremia-cornybacterium . PICC line replaced. Completed antibiotic course. She continued on milrinone 0.125 mcg. Started ivabradine.  Placement of CRT delayed.  S/p CRT-D placement 01/16/15 with Dr Caryl Comes.   Labs 01/29/15  with WBCs trending up 7.9 -> 10 -> 12. BCx ordered with [facility]. On 01/31/15 [facility] with 1/2 BCX + GPC  clusters. (I do not have final results from them) Refused admission. Treated with home vancomycin for 1 week. PICC line not changed.   She returns today for regular follow up. Milrinone stopped in February 2017. Says she feels terrible. But daughter clarifies that she is much more active during day able to do more activities. She says that she is very tired with all the activities and afterwards. Weight stable. No orthopnea or PND.    ECHO 10/02/14 LVEF 15% with diffuse hypokinesis, RV mildly dilated, normal function, PA peak pressure 59 mmHg Echo 2/17 EF 40-45%  Labs 3/17: Hgb 10.8, K 4.6 Cr 1.38   Past Medical History  Diagnosis Date  . Arthritis   . Headache(784.0)   . Anxiety   . Depression   . Asthma   . CAP (community acquired pneumonia) 12/19/2012  . Obesity   . Peripheral neuropathy ([place])   . Sacroiliitis, not elsewhere classified ([place])   . Osteopenia   . Hypercholesteremia   . Neuropathy ([place])   . Trigger finger     [name]  . Chest pain     NM stress test 05/2011 Normal  . DOE (dyspnea on exertion)     No SOB at rest  . Fatigue     excertional  . breast ca dx'd 2000    left  . Essential (primary) hypertension 04/02/2014  . Adult hypothyroidism 04/02/2014  . Hereditary and idiopathic peripheral neuropathy 07/02/2014  .  Leg weakness   . CKD (chronic kidney disease) stage 3, GFR 30-59 ml/min 08/13/2014  . Anemia of chronic disease 08/13/2014  . CHF (congestive heart failure) ([place]) 08/19/2014  . Situational depression   . Presence of permanent cardiac pacemaker 01/16/2015    BIV  . Intermittent LBBB (left bundle branch block) 12/21/2012    Current Outpatient Prescriptions  Medication Sig Dispense Refill  . acetaminophen (TYLENOL) 500 MG tablet Take 500 mg by mouth every 6 (six) hours as needed for mild pain or headache. Reported on 06/17/2015    . albuterol (PROVENTIL HFA;VENTOLIN HFA) 108 (90 BASE) MCG/ACT inhaler Inhale 2 puffs into the lungs every 6 (six) hours as  needed for wheezing or shortness of breath. Reported on 05/27/2015    . ALPRAZolam (XANAX) 0.25 MG tablet Take 0.25 mg by mouth 3 (three) times daily as needed for anxiety. Reported on 05/27/2015    . apixaban (ELIQUIS) 2.5 MG TABS tablet Take 1 tablet (2.5 mg total) by mouth 2 (two) times daily. 60 tablet 6  . b complex vitamins tablet Take 1 tablet by mouth daily with lunch.     . calcium carbonate (TUMS - DOSED IN MG ELEMENTAL CALCIUM) 500 MG chewable tablet Chew 2 tablets by mouth daily as needed for indigestion or heartburn. Reported on 07/01/2015    . Calcium Carbonate-Vitamin D (CALTRATE 600+D PO) Take 1 tablet by mouth daily with lunch.    . cholecalciferol (VITAMIN D) 1000 UNITS tablet Take 1,000 Units by mouth daily with lunch.     . citalopram (CELEXA) 20 MG tablet Take 1 tablet (20 mg total) by mouth daily. 30 tablet 6  . CORLANOR 5 MG TABS tablet TAKE 1 TABLET BY MOUTH TWICE DAILY WITH A MEAL.. 60 tablet 3  . DIGOX 125 MCG tablet TAKE 1/2 TABLET BY MOUTH EVERY DAY 15 tablet 4  . furosemide (LASIX) 40 MG tablet TAKE 1 TABLET BY MOUTH DAILY 30 tablet 4  . hydrALAZINE (APRESOLINE) 25 MG tablet Take 1 tablet (25 mg total) by mouth 3 (three) times daily. 90 tablet 3  . lactose free nutrition (BOOST) LIQD Take 237 mLs by mouth daily with breakfast.     . levothyroxine (SYNTHROID, LEVOTHROID) 25 MCG tablet Take 25 mcg by mouth daily before breakfast. for thyroid    . MAGNESIUM-OXIDE 400 (241.3 Mg) MG tablet TAKE 1/2 TABLET BY MOUTH EVERY DAY 15 tablet 0  . Multiple Vitamin (MULITIVITAMIN WITH MINERALS) TABS Take 1 tablet by mouth daily with lunch.     . nitroGLYCERIN (NITROSTAT) 0.4 MG SL tablet Place 1 tablet (0.4 mg total) under the tongue every 5 (five) minutes as needed for chest pain. 25 tablet 4  . pantoprazole (PROTONIX) 40 MG tablet Take 40 mg by mouth daily. Reported on 07/01/2015    . Polyethyl Glycol-Propyl Glycol (SYSTANE OP) Place 1 drop into both eyes 2 (two) times daily.    .  potassium chloride (K-DUR) 10 MEQ tablet TAKE 1 TABLET BY MOUTH DAILY 30 tablet 3  . spironolactone (ALDACTONE) 25 MG tablet TAKE 1 TABLET BY MOUTH DAILY 30 tablet 4   No current facility-administered medications for this encounter.    Allergies  Allergen Reactions  . Amiodarone Shortness Of Breath and Nausea And Vomiting  . Cymbalta [Duloxetine Hcl] Other (See Comments)    Depression  . Lyrica [Pregabalin] Other (See Comments)    Blurry vision      Social History   Social History  . Marital Status: Married  Spouse Name: N/A  . Number of Children: 5  . Years of Education: HS   Occupational History  . Retired    Social History Main Topics  . Smoking status: Never Smoker   . Smokeless tobacco: Never Used  . Alcohol Use: No  . Drug Use: No  . Sexual Activity: No   Other Topics Concern  . Not on file   Social History Narrative   Lives at home alone.   Right-handed.   Two cups caffeine daily (coffee).      Family History  Problem Relation Age of Onset  . Diabetes Brother   . Alzheimer's disease Sister   . CVA Daughter     01/21/09  . Pancreatic cancer Father 62  . Pneumonia Mother 28    cause of death  . Heart attack Neg Hx   . Stroke Daughter   . Hypertension Daughter     Danley Danker Vitals:   07/16/15 1428  BP: 144/66  Pulse: 78  Height: 5\' 5"  (1.651 m)  Weight: 154 lb (69.854 kg)  SpO2: 97%   Wt Readings from Last 3 Encounters:  07/16/15 154 lb (69.854 kg)  07/01/15 156 lb (70.761 kg)  06/23/15 156 lb 4 oz (70.875 kg)    PHYSICAL EXAM: General: In wheelchair, Daughter present.  HEENT: Normal, without mass or lesion. Neck: Supple, no bruits. JVP 6-7 cm. Carotids 2+. No thyromegaly or nodule. Heart: PMI nondisplaced, Regular rate no s4.  no murmur. ICD site ok Lungs: Slightly diminished R base Abdomen: Soft, NT, ND, no HSM +BS x 4.  Extremities: No clubbing, cyanosis. No ankle edema.  LUE PICC site clean, dry, and intact. Left thigh  musculature decreased compared to right.  Neuro: Alert and oriented X 3. Moves all extremities spontaneously.  Psych: Affect pleaseant  ASSESSMENT & PLAN:  1. Chronic Systolic Heart failure - Echo 10/02/14 EF 15% Recent echo 2/17 read as 40-45% I reviewed probably closer to 35%. Off milrinone  - She is worse NYHA IIIB. Volume status ok. Will repeat RHC to assess need for restarting milrinone. - Now s/p St Jude CRT-D placement. 01/16/15 - Continue hydralazine 25 mg tid - Continue digoxin 0.0625 mg daily . No bb with low output. Has been off ACE/ARB due to CKD - Continue lasix 40 mg daily + 10 meq potassium daily .   - Continue corlanor 5 mg BID.  2. H/o PE/ DVT bilateral by [place] 08/20/2014 - Switched to apixaban 2.5 bid for long-term prevention of DVT 3. CKD Stage 3  - Stable on recent labs via [facility].  4. Hx of LBBB 5. Hypertension  - Stable. 6. Intolerance of amiodarone due to possible acute lung toxicity 7. H/O Bacteremia -  -Saw ID. Bcx negative. No further work-up at this time   Alexandra Mabry,MD 2:55 PM

## 2015-07-23 ENCOUNTER — Other Ambulatory Visit: Payer: Self-pay | Admitting: *Deleted

## 2015-07-23 ENCOUNTER — Other Ambulatory Visit: Payer: Self-pay | Admitting: Pharmacist

## 2015-07-23 NOTE — Patient Outreach (Signed)
[facility]) Care Management  07/23/2015  AMANAT BEDNARIK 1927/01/20 [id number]  Received application from [place] in the mail from patient.  Placed call to patient to make her aware application was received, she verified her name, address, date of birth.    Patient denied other concerns at this time.  Will send prescriber portion of application to cardiologist for completion and send application to [place] once it is completed and returned.    Plan:  Will continue to follow patient to get patient assistance application completed.  Patient is aware of [facility] Pharmacist phone number and is aware she can call with pharmacy questions/concerns.    Karrie Meres, PharmD, [place] [phone]

## 2015-07-23 NOTE — Patient Outreach (Signed)
Pt had cardiac evaluation with Dr. Missy Sabins and they decided to do a R heart cath on Monday which revealed very satisfactory results. Pt is to continue her medical regimen and does not have to go back on milrinone. She says she feels reassured that her condition is not worsening. We had a prescheduled appt for Friday and we have moved that up to 9:00 am.  [name] [facility] [place] [phone]

## 2015-07-24 ENCOUNTER — Other Ambulatory Visit (HOSPITAL_COMMUNITY): Payer: Self-pay | Admitting: Student

## 2015-07-25 ENCOUNTER — Other Ambulatory Visit: Payer: Self-pay | Admitting: *Deleted

## 2015-07-25 NOTE — Patient Outreach (Signed)
[facility]) Care Management   07/25/2015  CESILIA [name] [birth year] [id number]  Alexandra [name] is an 82 y.o. female  Subjective:  Pt is doing well after having heart cath last week which revealed her condition is stable. No med changes. She will follow up with Dr. Missy Sabins on 7/24. She denies any HF sxs. She is still considering going to cardiac rehab. She also needs to see her orthopedist for R knee pain. She needs an eye exam also.   Objective:   Review of Systems  Constitutional: Negative.   HENT: Negative.   Eyes: Negative.   Respiratory: Negative.   Cardiovascular: Negative.   Gastrointestinal: Negative.   Genitourinary: Negative.   Musculoskeletal: Positive for joint pain.       R knee pain.  Skin: Negative.   Neurological: Negative.   Endo/Heme/Allergies: Negative.   Psychiatric/Behavioral: Negative.    BP 122/58 mmHg  Pulse 73  Resp 18  Wt 157 lb (71.215 kg)  SpO2 97%  Physical Exam  Constitutional: She appears well-developed and well-nourished.  HENT:  Head: Normocephalic and atraumatic.    Encounter Medications:   Outpatient Encounter Prescriptions as of 07/25/2015  Medication Sig  . acetaminophen (TYLENOL) 500 MG tablet Take 500 mg by mouth every 6 (six) hours as needed for mild pain or headache. Reported on 06/17/2015  . albuterol (PROVENTIL HFA;VENTOLIN HFA) 108 (90 BASE) MCG/ACT inhaler Inhale 2 puffs into the lungs every 6 (six) hours as needed for wheezing or shortness of breath. Reported on 05/27/2015  . ALPRAZolam (XANAX) 0.25 MG tablet Take 0.25 mg by mouth 3 (three) times daily as needed for anxiety. Reported on 05/27/2015  . apixaban (ELIQUIS) 2.5 MG TABS tablet Take 1 tablet (2.5 mg total) by mouth 2 (two) times daily.  Marland Kitchen b complex vitamins tablet Take 1 tablet by mouth daily with lunch.   . calcium carbonate (TUMS - DOSED IN MG ELEMENTAL CALCIUM) 500 MG chewable tablet Chew 2 tablets by mouth daily as needed for indigestion or  heartburn. Reported on 07/18/2015  . Calcium Carbonate-Vitamin D (CALTRATE 600+D PO) Take 1 tablet by mouth daily.   . cholecalciferol (VITAMIN D) 1000 UNITS tablet Take 1,000 Units by mouth daily with lunch.   . citalopram (CELEXA) 20 MG tablet Take 1 tablet (20 mg total) by mouth daily.  . CORLANOR 5 MG TABS tablet TAKE 1 TABLET BY MOUTH TWICE DAILY WITH A MEAL..  . DIGOX 125 MCG tablet TAKE 1/2 TABLET BY MOUTH EVERY DAY  . furosemide (LASIX) 40 MG tablet TAKE 1 TABLET BY MOUTH DAILY  . hydrALAZINE (APRESOLINE) 25 MG tablet Take 1 tablet (25 mg total) by mouth 3 (three) times daily.  Marland Kitchen lactose free nutrition (BOOST) LIQD Take 237 mLs by mouth daily with breakfast.   . levothyroxine (SYNTHROID, LEVOTHROID) 25 MCG tablet Take 25 mcg by mouth daily before breakfast. for thyroid  . MAGNESIUM-OXIDE 400 (241.3 Mg) MG tablet TAKE 1/2 TABLET(200 MG) BY MOUTH DAILY  . Multiple Vitamin (MULITIVITAMIN WITH MINERALS) TABS Take 1 tablet by mouth daily with lunch.   . nitroGLYCERIN (NITROSTAT) 0.4 MG SL tablet Place 1 tablet (0.4 mg total) under the tongue every 5 (five) minutes as needed for chest pain.  . pantoprazole (PROTONIX) 40 MG tablet Take 40 mg by mouth daily. Reported on 07/01/2015  . Polyethyl Glycol-Propyl Glycol (SYSTANE OP) Place 1 drop into both eyes 2 (two) times daily.  . potassium chloride (K-DUR) 10 MEQ tablet TAKE 1 TABLET BY MOUTH  DAILY  . spironolactone (ALDACTONE) 25 MG tablet TAKE 1 TABLET BY MOUTH DAILY   No facility-administered encounter medications on file as of 07/25/2015.    Functional Status:   In your present state of health, do you have any difficulty performing the following activities: 07/21/2015 05/27/2015  Hearing? N N  Vision? N N  Difficulty concentrating or making decisions? N N  Walking or climbing stairs? Y Y  Dressing or bathing? Y Y  Doing errands, shopping? - Y  Conservation officer, nature and eating ? - N  Using the Toilet? - N  In the past six months, have you accidently  leaked urine? - N  Do you have problems with loss of bowel control? - N  Managing your Medications? - N  Managing your Finances? - N  Housekeeping or managing your Housekeeping? - Y    Fall/Depression Screening:    PHQ 2/9 Scores 05/27/2015 02/07/2015 12/27/2014 11/07/2014 10/21/2014 07/01/2014 04/01/2014  PHQ - 2 Score 1 0 4 0 0 0 0  Exception Documentation - Other- indicate reason in comment box - - - - Other- indicate reason in comment box    Assessment:  HF stable                         R knee pain  Plan:  Encouraged her to make her appts before she is scheduled for cardiac rehab.            I will see her in one month.  [name] [facility] [place] [phone]

## 2015-07-28 ENCOUNTER — Telehealth: Payer: Self-pay | Admitting: Cardiology

## 2015-07-28 ENCOUNTER — Ambulatory Visit (INDEPENDENT_AMBULATORY_CARE_PROVIDER_SITE_OTHER): Payer: Commercial Managed Care - HMO | Admitting: *Deleted

## 2015-07-28 DIAGNOSIS — I429 Cardiomyopathy, unspecified: Secondary | ICD-10-CM | POA: Diagnosis not present

## 2015-07-28 DIAGNOSIS — Z950 Presence of cardiac pacemaker: Secondary | ICD-10-CM

## 2015-07-28 DIAGNOSIS — I428 Other cardiomyopathies: Secondary | ICD-10-CM

## 2015-07-28 NOTE — Telephone Encounter (Signed)
Spoke with pt and reminded pt of remote transmission that is due today. Pt verbalized understanding.   

## 2015-07-28 NOTE — Progress Notes (Signed)
Remote pacemaker transmission.   

## 2015-07-29 ENCOUNTER — Other Ambulatory Visit (HOSPITAL_COMMUNITY): Payer: Self-pay | Admitting: Pharmacist

## 2015-07-29 MED ORDER — APIXABAN 2.5 MG PO TABS
2.5000 mg | ORAL_TABLET | Freq: Two times a day (BID) | ORAL | Status: DC
Start: 2015-07-29 — End: 2016-04-01

## 2015-07-30 ENCOUNTER — Encounter: Payer: Self-pay | Admitting: Cardiology

## 2015-07-30 LAB — CUP PACEART REMOTE DEVICE CHECK
Battery Voltage: 2.96 V
Brady Statistic AS VP Percent: 90 %
Brady Statistic RA Percent Paced: 9.7 %
Implantable Lead Implant Date: 20161229
Implantable Lead Implant Date: 20161229
Implantable Lead Location: 753860
Lead Channel Impedance Value: 410 Ohm
Lead Channel Impedance Value: 590 Ohm
Lead Channel Pacing Threshold Amplitude: 0.75 V
Lead Channel Pacing Threshold Pulse Width: 0.5 ms
Lead Channel Pacing Threshold Pulse Width: 0.5 ms
Lead Channel Pacing Threshold Pulse Width: 1 ms
Lead Channel Setting Pacing Amplitude: 1.75 V
Lead Channel Setting Pacing Amplitude: 2 V
Lead Channel Setting Pacing Amplitude: 2.625
Lead Channel Setting Pacing Pulse Width: 0.5 ms
Lead Channel Setting Pacing Pulse Width: 1 ms
MDC IDC LEAD IMPLANT DT: 20161229
MDC IDC LEAD LOCATION: 753858
MDC IDC LEAD LOCATION: 753859
MDC IDC MSMT BATTERY REMAINING LONGEVITY: 68 mo
MDC IDC MSMT BATTERY REMAINING PERCENTAGE: 95 %
MDC IDC MSMT LEADCHNL LV IMPEDANCE VALUE: 860 Ohm
MDC IDC MSMT LEADCHNL LV PACING THRESHOLD AMPLITUDE: 1.875 V
MDC IDC MSMT LEADCHNL RA SENSING INTR AMPL: 4.7 mV
MDC IDC MSMT LEADCHNL RV PACING THRESHOLD AMPLITUDE: 1 V
MDC IDC MSMT LEADCHNL RV SENSING INTR AMPL: 12 mV
MDC IDC PG SERIAL: 7834528
MDC IDC SESS DTM: 20170710161115
MDC IDC SET LEADCHNL RV SENSING SENSITIVITY: 2 mV
MDC IDC STAT BRADY AP VP PERCENT: 10 %
MDC IDC STAT BRADY AP VS PERCENT: 1 %
MDC IDC STAT BRADY AS VS PERCENT: 1 %

## 2015-08-06 DIAGNOSIS — H10413 Chronic giant papillary conjunctivitis, bilateral: Secondary | ICD-10-CM | POA: Diagnosis not present

## 2015-08-06 DIAGNOSIS — H02403 Unspecified ptosis of bilateral eyelids: Secondary | ICD-10-CM | POA: Diagnosis not present

## 2015-08-06 DIAGNOSIS — H04123 Dry eye syndrome of bilateral lacrimal glands: Secondary | ICD-10-CM | POA: Diagnosis not present

## 2015-08-06 DIAGNOSIS — Z961 Presence of intraocular lens: Secondary | ICD-10-CM | POA: Diagnosis not present

## 2015-08-07 ENCOUNTER — Telehealth: Payer: Self-pay

## 2015-08-07 ENCOUNTER — Encounter: Payer: Self-pay | Admitting: Cardiology

## 2015-08-07 ENCOUNTER — Telehealth: Payer: Self-pay | Admitting: Cardiology

## 2015-08-07 NOTE — Telephone Encounter (Signed)
Returned patient call regarding billing.  Patient stated she spoke with [facility] regarding the billing code I provided and was told it requires preauthorization.  I advised that remote monitoring does not require preauthorization.  She stated the July remote monitoring was denied.  I provided number for her to call regarding the bill that was denied and offered to cancel the ICM remote transmission for August until she got the billing straightened out.  She stated she wants to keep the remote monitor for August.

## 2015-08-07 NOTE — Telephone Encounter (Signed)
Spoke with patient regarding ICM

## 2015-08-07 NOTE — Telephone Encounter (Signed)
New Message:    Please call,concerning a letter she received.

## 2015-08-07 NOTE — Telephone Encounter (Signed)
Received call from patient regarding ICM clinic.  Patient referred to [facility] clinic by [facility] Device RN/Dr Caryl Comes.  Explained ICM program and patient has agreed to monthly calls at this time but it will be dependent on her copay. Advised I would call billing department and call her back.  Spoke with billing department for coverage information.  Call back to patient.  Explained the service is normally covered by medicare which means [employer] should cover it billing unable to determine what her copay would be.  Provided billing code and explained she can call [employer] and provide the code [phone] with dx of CHF and find out if it is covered and copay amount.  She stated she would call.  She agreed to August ICM transmission and scheduled for 09/03/2015.  Provided ICM number and explained if she does not want monthly ICM calls just to call me back.  She gave permission to leave detailed message on phone.

## 2015-08-07 NOTE — Telephone Encounter (Signed)
Spoke w/ pt and gave her results from her remote transmission on 07-28-15. Call forwarded to [place].

## 2015-08-11 ENCOUNTER — Telehealth (HOSPITAL_COMMUNITY): Payer: Self-pay | Admitting: Pharmacist

## 2015-08-11 ENCOUNTER — Ambulatory Visit (HOSPITAL_COMMUNITY)
Admission: RE | Admit: 2015-08-11 | Discharge: 2015-08-11 | Disposition: A | Discharge status: Home / Self Care | Payer: Commercial Managed Care - HMO | Source: Ambulatory Visit | Attending: Internal Medicine | Admitting: Internal Medicine

## 2015-08-11 ENCOUNTER — Encounter (HOSPITAL_COMMUNITY): Payer: Commercial Managed Care - HMO | Admitting: Internal Medicine

## 2015-08-11 ENCOUNTER — Encounter (HOSPITAL_COMMUNITY): Payer: Self-pay | Admitting: Internal Medicine

## 2015-08-11 DIAGNOSIS — I10 Essential (primary) hypertension: Secondary | ICD-10-CM | POA: Diagnosis not present

## 2015-08-11 NOTE — Patient Instructions (Signed)
Your physician recommends that you schedule a follow-up appointment in: 2-3 months

## 2015-08-11 NOTE — Telephone Encounter (Signed)
[employer] patient assistance approved for Eliquis through 01/18/16 so will be receiving medication to her home every month at no charge to her.   [name]. Velva Harman, PharmD, BCPS, CPP Clinical Pharmacist Pager: [phone] Phone: [phone] 08/11/2015 12:14 PM

## 2015-08-15 ENCOUNTER — Other Ambulatory Visit: Payer: Self-pay | Admitting: Pharmacist

## 2015-08-15 NOTE — Patient Outreach (Signed)
[facility] [facility]) Care Management  08/15/2015  DARLISA SPRUIELL 16-Dec-1927 [id number]  Patient returned call to [place].    Patient verified HIPAA details.  Patient stated that she was approved for patient assistance with Eliquis from [place] through 01/18/16.    At this time, patient denies further pharmacy concerns or questions.    Patient asked if patient assistance went though her insurance company and she was counseled that manufacturer patient assistance for Eliquis was done through [employer].    Plan:  Given that patient denies further medication questions/concerns, will close pharmacy case as goal of patient assistance for Eliquis was met.   [place] is happy to assist if new pharmacy issues arise.   Will send in-basket message to [place], to make her aware of pharmacy case closure  Karrie Meres, PharmD, [place] [phone]

## 2015-08-15 NOTE — Patient Outreach (Signed)
[facility]) Care Management  08/15/2015  Alexandra [name] 1928-01-11 [id number]  Unsuccessful attempt to contact patient to follow-up with her about patient assistance application for Eliquis with [place] Patient [place].    Left a HIPAA compliant message requesting a call back.    Per review of chart, note from 08/11/15 by Doroteo Bradford, PharmD with Advanced Heart Failure Clinical indicates patient may have been approved.   Plan:  Will attempt to reach patient within the next week if no return call.    Karrie Meres, PharmD, [place] [phone]

## 2015-08-20 ENCOUNTER — Ambulatory Visit: Payer: Self-pay | Admitting: Pharmacist

## 2015-08-28 ENCOUNTER — Other Ambulatory Visit: Payer: Self-pay | Admitting: *Deleted

## 2015-08-28 ENCOUNTER — Encounter: Payer: Self-pay | Admitting: *Deleted

## 2015-08-28 NOTE — Patient Outreach (Signed)
[facility]) Care Management   08/28/2015  Alexandra [name] Jun 07, 1927 [id number]  Alexandra [name] is an 82 y.o. female  Subjective: Routine monthly visit. Pt is doing very well. She would like to be able to drive however her kids don't want her too and she does take meds that caution against driving. She still has not made an appt for her knee evaluation. She is waiting for a family member to be available. She also needs some dental work done.  She is following her heart failure action plan.   Objective:   Review of Systems  Constitutional: Negative.   HENT: Negative.   Eyes: Negative.   Respiratory: Positive for shortness of breath.        On exertion.  Cardiovascular: Negative.   Gastrointestinal: Negative.   Genitourinary: Negative.   Musculoskeletal: Positive for joint pain.       Right knee.  Skin: Negative.   Neurological: Negative.   Endo/Heme/Allergies: Negative.   Psychiatric/Behavioral: Negative.    BP (!) 108/58 (BP Location: Left Arm, Patient Position: Sitting, Cuff Size: Normal)   Pulse 74   Resp 16   Wt 154 lb (69.9 kg)   SpO2 96%   BMI 26.03 kg/m   Physical Exam  Constitutional: She is oriented to person, place, and time. She appears well-developed and well-nourished.  Cardiovascular: Normal rate, regular rhythm and normal heart sounds.   Respiratory: Effort normal and breath sounds normal.  GI: Soft. Bowel sounds are normal.  Musculoskeletal:  Uses her cane and walker.  Neurological: She is alert and oriented to person, place, and time.  Skin: Skin is warm.  Psychiatric: She has a normal mood and affect.    Encounter Medications:   Outpatient Encounter Prescriptions as of 08/28/2015  Medication Sig  . acetaminophen (TYLENOL) 500 MG tablet Take 500 mg by mouth every 6 (six) hours as needed for mild pain or headache. Reported on 06/17/2015  . albuterol (PROVENTIL HFA;VENTOLIN HFA) 108 (90 BASE) MCG/ACT inhaler Inhale 2 puffs into  the lungs every 6 (six) hours as needed for wheezing or shortness of breath. Reported on 05/27/2015  . ALPRAZolam (XANAX) 0.25 MG tablet Take 0.25 mg by mouth 3 (three) times daily as needed for anxiety. Reported on 05/27/2015  . apixaban (ELIQUIS) 2.5 MG TABS tablet Take 1 tablet (2.5 mg total) by mouth 2 (two) times daily.  Marland Kitchen b complex vitamins tablet Take 1 tablet by mouth daily with lunch.   . calcium carbonate (TUMS - DOSED IN MG ELEMENTAL CALCIUM) 500 MG chewable tablet Chew 2 tablets by mouth daily as needed for indigestion or heartburn. Reported on 07/18/2015  . Calcium Carbonate-Vitamin D (CALTRATE 600+D PO) Take 1 tablet by mouth daily.   . cholecalciferol (VITAMIN D) 1000 UNITS tablet Take 1,000 Units by mouth daily with lunch.   . citalopram (CELEXA) 20 MG tablet Take 1 tablet (20 mg total) by mouth daily.  . CORLANOR 5 MG TABS tablet TAKE 1 TABLET BY MOUTH TWICE DAILY WITH A MEAL..  . DIGOX 125 MCG tablet TAKE 1/2 TABLET BY MOUTH EVERY DAY  . furosemide (LASIX) 40 MG tablet TAKE 1 TABLET BY MOUTH DAILY  . hydrALAZINE (APRESOLINE) 25 MG tablet Take 1 tablet (25 mg total) by mouth 3 (three) times daily.  Marland Kitchen lactose free nutrition (BOOST) LIQD Take 237 mLs by mouth daily with breakfast.   . levothyroxine (SYNTHROID, LEVOTHROID) 25 MCG tablet Take 25 mcg by mouth daily before breakfast. for thyroid  .  MAGNESIUM-OXIDE 400 (241.3 Mg) MG tablet TAKE 1/2 TABLET(200 MG) BY MOUTH DAILY  . Multiple Vitamin (MULITIVITAMIN WITH MINERALS) TABS Take 1 tablet by mouth daily with lunch.   . nitroGLYCERIN (NITROSTAT) 0.4 MG SL tablet Place 1 tablet (0.4 mg total) under the tongue every 5 (five) minutes as needed for chest pain.  . pantoprazole (PROTONIX) 40 MG tablet Take 40 mg by mouth daily. Reported on 07/01/2015  . Polyethyl Glycol-Propyl Glycol (SYSTANE OP) Place 1 drop into both eyes 2 (two) times daily.  . potassium chloride (K-DUR) 10 MEQ tablet TAKE 1 TABLET BY MOUTH DAILY  . spironolactone  (ALDACTONE) 25 MG tablet TAKE 1 TABLET BY MOUTH DAILY   No facility-administered encounter medications on file as of 08/28/2015.     Functional Status:   In your present state of health, do you have any difficulty performing the following activities: 07/21/2015 05/27/2015  Hearing? N N  Vision? N N  Difficulty concentrating or making decisions? N N  Walking or climbing stairs? Y Y  Dressing or bathing? Y Y  Doing errands, shopping? - Y  Conservation officer, nature and eating ? - N  Using the Toilet? - N  In the past six months, have you accidently leaked urine? - N  Do you have problems with loss of bowel control? - N  Managing your Medications? - N  Managing your Finances? - N  Housekeeping or managing your Housekeeping? - Y  Some recent data might be hidden    Fall/Depression Screening:    PHQ 2/9 Scores 05/27/2015 02/07/2015 12/27/2014 11/07/2014 10/21/2014 07/01/2014 04/01/2014  PHQ - 2 Score 1 0 4 0 0 0 0  Exception Documentation - Other- indicate reason in comment box - - - - Other- indicate reason in comment box    Assessment:  CHF Stable                         R chronic knee pain  Plan: I am moving towards discharging Mrs. Klimaszewski but NOT QUIT YET. I will call her next month to see how she is doing and we will discuss where we go from there.  THN CM Care Plan Problem One   Flowsheet Row Most Recent Value  Care Plan Problem One  CHF  Role Documenting the Problem One  Care Management Coordinator  Care Plan for Problem One  Active  THN Long Term Goal (31-90 days)  Pt will not admit to the hospital for CHF in the next 90 days.  THN Long Term Goal Start Date  05/27/15  THN Long Term Goal Met Date  08/28/15  [facility] CM Short Term Goal #1 (0-30 days)  Pt will call me if she goes into the [facility] for intervention to avoid complications or hospitalization over the next 30 days..  THN CM Short Term Goal #1 Start Date  05/27/15  [facility] CM Short Term Goal #1 Met Date  07/25/15    [facility] CM Care Plan  Problem Two   Flowsheet Row Most Recent Value  Care Plan Problem Two  Pt will weigh daily, take her meds as ordered, take her BP, and exercise 10 minutes daily over the next 30 days.  Role Documenting the Problem Two  Care Management Coordinator  Care Plan for Problem Two  Active  THN CM Short Term Goal #1 Start Date  05/27/15  [facility] CM Short Term Goal #1 Met Date   07/01/15  Interventions for Short Term Goal #2  Encouraged to participate in the cardiac rehab program. I will call so someone from cardiac rehab will call pt to explain service.     [name] [facility] [place] [phone]

## 2015-09-01 ENCOUNTER — Other Ambulatory Visit (HOSPITAL_COMMUNITY): Payer: Self-pay | Admitting: *Deleted

## 2015-09-01 MED ORDER — HYDRALAZINE HCL 25 MG PO TABS: 25.0000 mg | ORAL_TABLET | Freq: Three times a day (TID) | ORAL | 3 refills | Status: DC

## 2015-09-03 ENCOUNTER — Ambulatory Visit (INDEPENDENT_AMBULATORY_CARE_PROVIDER_SITE_OTHER): Payer: Commercial Managed Care - HMO

## 2015-09-03 ENCOUNTER — Telehealth: Payer: Self-pay | Admitting: Cardiology

## 2015-09-03 DIAGNOSIS — I5022 Chronic systolic (congestive) heart failure: Secondary | ICD-10-CM | POA: Diagnosis not present

## 2015-09-03 DIAGNOSIS — Z950 Presence of cardiac pacemaker: Secondary | ICD-10-CM | POA: Diagnosis not present

## 2015-09-03 NOTE — Telephone Encounter (Signed)
Spoke with pt and reminded pt of remote transmission that is due today. Pt verbalized understanding.   

## 2015-09-03 NOTE — Progress Notes (Signed)
EPIC Encounter for ICM Monitoring  Patient Name: Alexandra [name] is a 82 y.o. female Date: 09/03/2015 Primary Care Physican:  Melinda Crutch, MD Primary Cardiologist: Skains/Bensimhon Electrophysiologist: Caryl Comes Dry Weight: unknown Bi-V Pacing:  >99%       1st ICM encounter  Heart Failure questions reviewed, pt asymptomatic.  She reported her ankles will swell if she is retaining fluid but no swelling today  Thoracic impedance abnormal suggesting fluid accumulation 08/19/2015 to 08/30/2015 and returned to normal 08/30/2015.  Recommendations: No changes.  Discussed importance of low sodium diet.   Follow-up plan: ICM clinic phone appointment on 10/08/2015.  Copy of ICM check sent to device physician.   ICM trend: 09/03/2015       [name], RN 09/03/2015 2:51 PM

## 2015-09-09 ENCOUNTER — Other Ambulatory Visit (HOSPITAL_COMMUNITY): Payer: Self-pay | Admitting: Internal Medicine

## 2015-09-11 ENCOUNTER — Telehealth (HOSPITAL_COMMUNITY): Payer: Self-pay | Admitting: *Deleted

## 2015-09-11 NOTE — Telephone Encounter (Signed)
Pt called to report her BP has been running a little low for past 3-4 days.  She reports today it was 104/56 which is the lowest it has been.  She denies dizziness/lightheadedness.  Reports wt is stable.  She c/o of fatigue but states this is at baseline and has been for a while, this is not new or worse.  Advised to continue to monitor BP, if runs below 90 or develops symptoms to call us back, she is agreeable.

## 2015-09-20 ENCOUNTER — Other Ambulatory Visit (HOSPITAL_COMMUNITY): Payer: Self-pay | Admitting: Student

## 2015-09-25 ENCOUNTER — Other Ambulatory Visit: Payer: Self-pay | Admitting: *Deleted

## 2015-09-25 NOTE — Patient Outreach (Signed)
Telephone assessment: Called with intention of closing case if patient reports she is doing well. However, she feels weaker than norma and has no energy. She reports she has called her MD and is waiting for a return call. I told her she definitely should see her doctor. If they feel like she needs labs and she cannot get out of her home due to no transportation, I have offered to come and evaluate her at home. I have asked her to call me if or if she doesn't get an appt. I will then plan accordingly. If I don't hear back from her today I will call her on Monday to follow up.  [name] [facility] [place] [phone]

## 2015-09-26 ENCOUNTER — Telehealth (HOSPITAL_COMMUNITY): Payer: Self-pay | Admitting: *Deleted

## 2015-09-26 NOTE — Telephone Encounter (Signed)
Pt called concerned about feeling fatigued, she wants to know if she can get some iron pills to take, she states she does have anemia.  Advised she would need to see pcp and have labs done before starting any iron pills.  Also advised increased fatigue could be related to her heart failure.  She states she is sch to see her pcp next week and will ask them to do labs to check her blood count, if not the problem she will call us back for an appt

## 2015-09-29 ENCOUNTER — Ambulatory Visit: Payer: Self-pay | Admitting: *Deleted

## 2015-09-29 ENCOUNTER — Other Ambulatory Visit: Payer: Self-pay | Admitting: *Deleted

## 2015-09-29 NOTE — Patient Outreach (Signed)
Called and spoke to Alexandra [name] this am. She reports she did talk to her cardiologist office about her ongoing fatigue and hx of anemia. They sais she would need to be evaluated by her primary care provider and have labs done to assess whether she actually is anemic or note to start iron tablets. I checked the schedule with [name] and she is not scheduled until Sept 27th. I asked if I could come on Thursday and draw labs so we have that information long before that office visit and she agreed. I will see her on Thursday afternoon and check a CBC and iron studies.  Alexandra [name] denies SOB, Wt gain, or edema. She just continues to have fatigue.  [name] [facility] [place] [phone]

## 2015-10-02 ENCOUNTER — Other Ambulatory Visit: Payer: Self-pay | Admitting: *Deleted

## 2015-10-02 DIAGNOSIS — D539 Nutritional anemia, unspecified: Secondary | ICD-10-CM | POA: Diagnosis not present

## 2015-10-03 ENCOUNTER — Other Ambulatory Visit: Payer: Self-pay | Admitting: *Deleted

## 2015-10-03 NOTE — Patient Outreach (Signed)
Telephone call to discuss pt lab work that I drew yesterday. I advised her that her Hgb was basically unchanged (11.8 vs 11.9 on her last lab draw), Her Iron Saturation is low, B12 was normal, her metabolic panel does show CKD stage IV, and one test the folate level is still pending. I educated her about CKD, it is normal for someone 82 years old to have kidney function decline and her treatment of HF also affects these levels. I shared with her that I had learned that IV Iron infusion can sometimes be helpful to improve quality of life which I think may be beneficial to her and I did share this with Dr. Harrington Challenger (UpToDate - tx of iron deficiency in pts with heart failure who are not necessarily anemic). She will be seeing Dr. Harrington Challenger on the 27th of this month and he will discuss this further with her. I also shared that I recommend [facility] to offer her more support. I will call her again in mid October and I have encouraged her to call me if she has any problems or questions.  [name] [facility] [place] [phone]

## 2015-10-03 NOTE — Patient Outreach (Signed)
[facility]) Care Management  10/03/2015  Alexandra [name] 1927-02-25 [id number]  Acute home visit to assess pt and draw labs. She is feeling very fatigued, taking lots of naps, states she can "Hardly get one foot in front of the other." She reports a hx of anemia. She was previously on Iron Supplementation but has not been taking it in quite awhile.   I believe taking some blood and running some studies to see if she maybe again anemic, see what her kidney function is, and pt requests a B12 level too.  Drew: CBC with diff, Iron studies, ferritin and folate levels and a B12 level. I delivered this specimen to [facility] and talked with Dr. Harrington Challenger about the patient's condition and her complaints and he agreed these studies were pertinent.  I introduced the idea that Palliative Care Services from [facility] would be a most appropriate action and be a wonderful support and resource to her. She is skeptical at this time.  I will follow up with Alexandra [name] when I receive the results.  [name] [facility] [place] [phone]

## 2015-10-08 ENCOUNTER — Encounter: Payer: Self-pay | Admitting: Internal Medicine

## 2015-10-08 ENCOUNTER — Ambulatory Visit (INDEPENDENT_AMBULATORY_CARE_PROVIDER_SITE_OTHER): Payer: Commercial Managed Care - HMO

## 2015-10-08 DIAGNOSIS — Z950 Presence of cardiac pacemaker: Secondary | ICD-10-CM | POA: Diagnosis not present

## 2015-10-08 DIAGNOSIS — I5022 Chronic systolic (congestive) heart failure: Secondary | ICD-10-CM

## 2015-10-08 NOTE — Progress Notes (Signed)
EPIC Encounter for ICM Monitoring  Patient Name: Alexandra [name] is a 82 y.o. female Date: 10/08/2015 Primary Care Physican:  Melinda Crutch, MD Primary Cardiologist: Skains/Bensimhon Electrophysiologist: Caryl Comes Dry Weight:  unknown Bi-V Pacing:  >99%       Heart Failure questions reviewed, pt asymptomatic for fluid symptoms but does feel very fatigued.  She will be following up with PCP regarding fatigue.    She is also being referred to nephrologist.  Encouraged her to call for any fluid symptoms.    Thoracic impedance abnormal suggesting fluid accumulation since 10/06/2015.  LABS: 07/21/2015 Creatinine 1.55, BUN 29, Potassium 3.8, Sodium 139 07/11/2015 Creatinine 1.65, BUN 27, Potassium 4.0, Sodium 138 06/23/2015 Creatinine 1.61, BUN 34, Potassium 4.1, Sodium 139 Creatinine ranged from 1.10 to 1.47 in 2016  Recommendations:  Advised will send copy to Dr Haroldine Laws, Dr Marlou Porch and Dr Caryl Comes for any recommendations regarding abnormal impedance or if will be addressed at HF office visit 9/25.    Follow-up plan: ICM clinic phone appointment on 11/12/2015.  HF office appointment with Dr Haroldine Laws on 10/13/2015.  ICM trend: 10/08/2015       Alexandra Billings, RN 10/08/2015 1:24 PM

## 2015-10-13 ENCOUNTER — Ambulatory Visit (HOSPITAL_COMMUNITY)
Admission: RE | Admit: 2015-10-13 | Discharge: 2015-10-13 | Disposition: A | Discharge status: Home / Self Care | Payer: Commercial Managed Care - HMO | Source: Ambulatory Visit | Attending: Internal Medicine | Admitting: Internal Medicine

## 2015-10-13 ENCOUNTER — Telehealth (HOSPITAL_COMMUNITY): Payer: Self-pay | Admitting: Vascular Surgery

## 2015-10-13 VITALS — BP 138/52 | HR 77 | Wt 147.1 lb

## 2015-10-13 DIAGNOSIS — E669 Obesity, unspecified: Secondary | ICD-10-CM | POA: Diagnosis not present

## 2015-10-13 DIAGNOSIS — Z86711 Personal history of pulmonary embolism: Secondary | ICD-10-CM | POA: Diagnosis not present

## 2015-10-13 DIAGNOSIS — Z8249 Family history of ischemic heart disease and other diseases of the circulatory system: Secondary | ICD-10-CM | POA: Diagnosis not present

## 2015-10-13 DIAGNOSIS — Z8619 Personal history of other infectious and parasitic diseases: Secondary | ICD-10-CM | POA: Insufficient documentation

## 2015-10-13 DIAGNOSIS — D631 Anemia in chronic kidney disease: Secondary | ICD-10-CM | POA: Insufficient documentation

## 2015-10-13 DIAGNOSIS — Z808 Family history of malignant neoplasm of other organs or systems: Secondary | ICD-10-CM | POA: Insufficient documentation

## 2015-10-13 DIAGNOSIS — N183 Chronic kidney disease, stage 3 (moderate): Secondary | ICD-10-CM | POA: Insufficient documentation

## 2015-10-13 DIAGNOSIS — E039 Hypothyroidism, unspecified: Secondary | ICD-10-CM | POA: Diagnosis not present

## 2015-10-13 DIAGNOSIS — Z7901 Long term (current) use of anticoagulants: Secondary | ICD-10-CM | POA: Insufficient documentation

## 2015-10-13 DIAGNOSIS — I447 Left bundle-branch block, unspecified: Secondary | ICD-10-CM | POA: Insufficient documentation

## 2015-10-13 DIAGNOSIS — G629 Polyneuropathy, unspecified: Secondary | ICD-10-CM | POA: Diagnosis not present

## 2015-10-13 DIAGNOSIS — Z950 Presence of cardiac pacemaker: Secondary | ICD-10-CM | POA: Diagnosis not present

## 2015-10-13 DIAGNOSIS — M8580 Other specified disorders of bone density and structure, unspecified site: Secondary | ICD-10-CM | POA: Diagnosis not present

## 2015-10-13 DIAGNOSIS — Z853 Personal history of malignant neoplasm of breast: Secondary | ICD-10-CM | POA: Diagnosis not present

## 2015-10-13 DIAGNOSIS — E7800 Pure hypercholesterolemia, unspecified: Secondary | ICD-10-CM | POA: Insufficient documentation

## 2015-10-13 DIAGNOSIS — Z823 Family history of stroke: Secondary | ICD-10-CM | POA: Diagnosis not present

## 2015-10-13 DIAGNOSIS — I130 Hypertensive heart and chronic kidney disease with heart failure and stage 1 through stage 4 chronic kidney disease, or unspecified chronic kidney disease: Secondary | ICD-10-CM | POA: Insufficient documentation

## 2015-10-13 DIAGNOSIS — Z833 Family history of diabetes mellitus: Secondary | ICD-10-CM | POA: Insufficient documentation

## 2015-10-13 DIAGNOSIS — Z6824 Body mass index (BMI) 24.0-24.9, adult: Secondary | ICD-10-CM | POA: Insufficient documentation

## 2015-10-13 DIAGNOSIS — Z888 Allergy status to other drugs, medicaments and biological substances status: Secondary | ICD-10-CM | POA: Insufficient documentation

## 2015-10-13 DIAGNOSIS — F418 Other specified anxiety disorders: Secondary | ICD-10-CM | POA: Diagnosis not present

## 2015-10-13 DIAGNOSIS — I5022 Chronic systolic (congestive) heart failure: Secondary | ICD-10-CM

## 2015-10-13 NOTE — Patient Instructions (Signed)
We will contact you in 6 months to schedule your next appointment.  

## 2015-10-13 NOTE — Progress Notes (Signed)
Advanced Heart Failure Medication Review by a Pharmacist  Does the patient  feel that his/her medications are working for him/her?  yes  Has the patient been experiencing any side effects to the medications prescribed?  no  Does the patient measure his/her own blood pressure or blood glucose at home?  yes   Does the patient have any problems obtaining medications due to transportation or finances?   no  Understanding of regimen: good Understanding of indications: good Potential of compliance: good Patient understands to avoid NSAIDs. Patient understands to avoid decongestants.  Issues to address at subsequent visits: None   Pharmacist comments:  Alexandra [name] is a pleasant 82 yo F presenting with her son and an updated medication list. She reports good compliance with her regimen and did not have any specific medication-related questions or concerns for me at this time.   [name]. Velva Harman, PharmD, BCPS, CPP Clinical Pharmacist Pager: [phone] Phone: [phone] 10/13/2015 12:47 PM      Time with patient: 10 minutes Preparation and documentation time: 2 minutes Total time: 12 minutes

## 2015-10-13 NOTE — Telephone Encounter (Signed)
PT left message w/ answering service wanting to know parking code called pt/ left message w/ code

## 2015-10-13 NOTE — Progress Notes (Signed)
Patient ID: Alexandra [name], female   DOB: 04-Mar-1927, 82 y.o.   MRN: [id number] Patient ID: Alexandra [name], female   DOB: 01-14-1928, 82 y.o.   MRN: [id number]     Advanced Heart Failure Clinic Note   Primary Care: Dr. Melinda Crutch Primary Cardiologist: Dr Candee Furbish Primary HF: Dr Haroldine Laws  HPI: Alexandra [name] is a 82 y.o. 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 Discharge weight was 196.  She was admitted in July 2016 for Abdominal pain and SOB. Her Gallbladder was thought to be causing her pain, so a lap choley was performed that admission. Echo on 08/15/14 showed EF 20%. There were thoughts that she may have stress induced CMP due to the death of her husband in 27-May-2022 after several months of hospice care.   She was directly admitted to [facility] on 10/11/14 with concerns for low output with SBPs in 80s and tachycardia.  PICC line was placed with initial co-ox of 61%.CVP was 15. Milrinone started when co-ox dropped to 53%. She had SVT on milrinone 0.25 so started on amio. Developed acute SOB that improved quickly with cessation of amio and extra dose of IV lasix. Felt to possibly have acute amio toxicity. Milrinone decreased to 0.125 and no further PSVT. Sent home on milrinone 0.125. Diuresed well on 80 mg lasix IB BID. Discharge weight was 170.  Admitted 12/1 through 12/6 with PICC line infection. Bcx with [facility] 2/2 with for diptheroids. Bcx 1/2 at [facility]-Bacteremia-cornybacterium . PICC line replaced. Completed antibiotic course. She continued on milrinone 0.125 mcg. Started ivabradine.  Placement of CRT delayed.  S/p CRT-D placement 01/16/15 with Dr Caryl Comes.   Labs 01/29/15  with WBCs trending up 7.9 -> 10 -> 12. BCx ordered with [facility]. On 01/31/15 [facility] with 1/2 BCX + GPC  clusters. (I do not have final results from them) Refused admission. Treated with home vancomycin for 1 week. PICC line not changed.   She returns today for regular follow up. Milrinone stopped in February 2017. In July was very fatigued. We did RHC and it was great. Says she stills feels very tired. Hgb drawn recently was stable at 11.6. Iron low and she has started supplemental iron. Stopped going to [employer] because she was too tired. Fatigued with minimal activity. Denies edema. Weight down 5 pounds. No orthopnea or PND.   RHC 7/17 RA = 1 RV = 25/2 PA = 29/9 (15) PCW = 6 Fick cardiac output/index = 5.9/3.3 PVR = 1.5 WU Ao sat = 98% PA sat = 68%, 70%, 71%  ECHO 10/02/14 LVEF 15% with diffuse hypokinesis, RV mildly dilated, normal function, PA peak pressure 59 mmHg Echo 2/17 EF 40-45% (I fetl 35%)   Labs 3/17: Hgb 10.8, K 4.6 Cr 1.38   Past Medical History:  Diagnosis Date  . Adult hypothyroidism 04/02/2014  . Anemia of chronic disease 08/13/2014  . Anxiety   . Arthritis   . Asthma   . breast ca dx'd 2000   left  . CAP (community acquired pneumonia) 12/19/2012  . Chest pain    NM stress test 05/2011 Normal  . CHF (congestive heart failure) ([place]) 08/19/2014  . CKD (chronic kidney disease) stage 3, GFR 30-59 ml/min 08/13/2014  . Depression   .  DOE (dyspnea on exertion)    No SOB at rest  . Essential (primary) hypertension 04/02/2014  . Fatigue    excertional  . PNTIRWER(154.0)   . Hereditary and idiopathic peripheral neuropathy 07/02/2014  . Hypercholesteremia   . Intermittent LBBB (left bundle branch block) 12/21/2012  . Leg weakness   . Neuropathy ([place])   . Obesity   . Osteopenia   . Peripheral neuropathy ([place])   . Presence of permanent cardiac pacemaker 01/16/2015   BIV  . Sacroiliitis, not elsewhere classified ([place])   . Situational depression   . Trigger finger    [name]    Current Outpatient Prescriptions  Medication Sig Dispense Refill  . acetaminophen  (TYLENOL) 500 MG tablet Take 500 mg by mouth every 6 (six) hours as needed for mild pain or headache. Reported on 06/17/2015    . albuterol (PROVENTIL HFA;VENTOLIN HFA) 108 (90 BASE) MCG/ACT inhaler Inhale 2 puffs into the lungs every 6 (six) hours as needed for wheezing or shortness of breath. Reported on 05/27/2015    . ALPRAZolam (XANAX) 0.25 MG tablet Take 0.25 mg by mouth 3 (three) times daily as needed for anxiety. Reported on 05/27/2015    . apixaban (ELIQUIS) 2.5 MG TABS tablet Take 1 tablet (2.5 mg total) by mouth 2 (two) times daily. 60 tablet 11  . b complex vitamins tablet Take 1 tablet by mouth daily with lunch.     . calcium carbonate (TUMS - DOSED IN MG ELEMENTAL CALCIUM) 500 MG chewable tablet Chew 2 tablets by mouth daily as needed for indigestion or heartburn. Reported on 07/18/2015    . Calcium Carbonate-Vitamin D (CALTRATE 600+D PO) Take 1 tablet by mouth daily.     . cholecalciferol (VITAMIN D) 1000 UNITS tablet Take 1,000 Units by mouth daily with lunch.     . citalopram (CELEXA) 20 MG tablet Take 1 tablet (20 mg total) by mouth daily. 30 tablet 6  . CORLANOR 5 MG TABS tablet TAKE 1 TABLET BY MOUTH TWICE DAILY WITH A MEAL.. 60 tablet 3  . DIGOX 125 MCG tablet TAKE 1/2 TABLET BY MOUTH EVERY DAY 15 tablet 4  . ferrous sulfate 325 (65 FE) MG tablet Take 325 mg by mouth daily with breakfast.    . furosemide (LASIX) 40 MG tablet TAKE 1 TABLET BY MOUTH DAILY 30 tablet 4  . hydrALAZINE (APRESOLINE) 25 MG tablet Take 1 tablet (25 mg total) by mouth 3 (three) times daily. 90 tablet 3  . lactose free nutrition (BOOST) LIQD Take 237 mLs by mouth daily with breakfast.     . levothyroxine (SYNTHROID, LEVOTHROID) 25 MCG tablet Take 25 mcg by mouth daily before breakfast. for thyroid    . MAGNESIUM-OXIDE 400 (241.3 Mg) MG tablet TAKE 1/2 TABLET(200 MG) BY MOUTH DAILY 15 tablet 3  . Multiple Vitamin (MULITIVITAMIN WITH MINERALS) TABS Take 1 tablet by mouth daily with lunch.     . pantoprazole  (PROTONIX) 40 MG tablet Take 40 mg by mouth daily. Reported on 07/01/2015    . Polyethyl Glycol-Propyl Glycol (SYSTANE OP) Place 1 drop into both eyes 2 (two) times daily.    . potassium chloride (K-DUR) 10 MEQ tablet TAKE 1 TABLET BY MOUTH DAILY 30 tablet 3  . spironolactone (ALDACTONE) 25 MG tablet TAKE 1 TABLET BY MOUTH DAILY 30 tablet 4  . nitroGLYCERIN (NITROSTAT) 0.4 MG SL tablet Place 1 tablet (0.4 mg total) under the tongue every 5 (five) minutes as needed for chest pain. (Patient not taking:  Reported on 10/13/2015) 25 tablet 4   No current facility-administered medications for this encounter.     Allergies  Allergen Reactions  . Amiodarone Shortness Of Breath and Nausea And Vomiting  . Cymbalta [Duloxetine Hcl] Other (See Comments)    Depression  . Lyrica [Pregabalin] Other (See Comments)    Blurry vision      Social History   Social History  . Marital status: Married    Spouse name: N/A  . Number of children: 5  . Years of education: HS   Occupational History  . Retired    Social History Main Topics  . Smoking status: Never Smoker  . Smokeless tobacco: Never Used  . Alcohol use No  . Drug use: No  . Sexual activity: No   Other Topics Concern  . Not on file   Social History Narrative   Lives at home alone.   Right-handed.   Two cups caffeine daily (coffee).      Family History  Problem Relation Age of Onset  . Pneumonia Mother 50    cause of death  . Diabetes Brother   . Pancreatic cancer Father 33  . Alzheimer's disease Sister   . CVA Daughter     01/21/09  . Stroke Daughter   . Hypertension Daughter   . Heart attack Neg Hx     Vitals:   10/13/15 1220  BP: (!) 138/52  Pulse: 77  SpO2: 95%  Weight: 147 lb 1.9 oz (66.7 kg)   Wt Readings from Last 3 Encounters:  10/13/15 147 lb 1.9 oz (66.7 kg)  10/03/15 154 lb (69.9 kg)  08/28/15 154 lb (69.9 kg)    PHYSICAL EXAM: General: Sitting on exam table. NAD.  HEENT: Normal, without mass or  lesion. Neck: Supple, no bruits. JVP 6-7 cm. Carotids 2+. No thyromegaly or nodule. Heart: PMI nondisplaced, Regular rate no s4.  no murmur. ICD site ok Lungs: Slightly diminished R base Abdomen: Soft, NT, ND, no HSM +BS x 4.  Extremities: No clubbing, cyanosis. No ankle edema.  Neuro: Alert and oriented X 3. Moves all extremities spontaneously.  Psych: Affect pleasant  ICD interrogated personally: Volume status ok. Activity 2-3 hours every day. No AF/VT   ASSESSMENT & PLAN:  1. Chronic Systolic Heart failure - Echo 10/02/14 EF 15% Recent echo 2/17 read as 40-45% I reviewed probably closer to 35%. Off milrinone  - She continues with NYHA IIIB symptoms though recent RHC and Corevue do not support.  - Volume status ok. - Now s/p St Jude CRT-D placement. 01/16/15 - Continue hydralazine 25 mg tid - Stop digoxin - Continue lasix 40 mg daily + 10 meq potassium daily .   - Continue corlanor 5 mg BID.  2. H/o PE/ DVT bilateral by [place] 08/20/2014 - Switched to apixaban 2.5 bid for long-term prevention of DVT 3. CKD Stage 3  - Stable on recent labs via [facility]  4. Hx of LBBB 5. Hypertension  - Stable. 6. Intolerance of amiodarone due to possible acute lung toxicity 7. H/O Bacteremia -  -Saw ID. Bcx negative. No further work-up at this time   Zoye Chandra,MD 12:34 PM

## 2015-10-13 NOTE — Addendum Note (Signed)
Encounter addended by: Scarlette Calico, RN on: 10/13/2015 12:50 PM<BR>    Actions taken: Sign clinical note

## 2015-10-21 ENCOUNTER — Encounter: Payer: Self-pay | Admitting: Cardiovascular Disease

## 2015-10-21 DIAGNOSIS — D649 Anemia, unspecified: Secondary | ICD-10-CM | POA: Diagnosis not present

## 2015-10-21 DIAGNOSIS — Z23 Encounter for immunization: Secondary | ICD-10-CM | POA: Diagnosis not present

## 2015-10-21 DIAGNOSIS — N184 Chronic kidney disease, stage 4 (severe): Secondary | ICD-10-CM | POA: Diagnosis not present

## 2015-10-30 ENCOUNTER — Ambulatory Visit: Payer: Self-pay | Admitting: *Deleted

## 2015-11-12 ENCOUNTER — Ambulatory Visit (INDEPENDENT_AMBULATORY_CARE_PROVIDER_SITE_OTHER): Payer: Commercial Managed Care - HMO

## 2015-11-12 ENCOUNTER — Other Ambulatory Visit (HOSPITAL_COMMUNITY): Payer: Self-pay | Admitting: Student

## 2015-11-12 ENCOUNTER — Ambulatory Visit (INDEPENDENT_AMBULATORY_CARE_PROVIDER_SITE_OTHER): Payer: Commercial Managed Care - HMO | Admitting: *Deleted

## 2015-11-12 DIAGNOSIS — Z950 Presence of cardiac pacemaker: Secondary | ICD-10-CM | POA: Diagnosis not present

## 2015-11-12 DIAGNOSIS — I5022 Chronic systolic (congestive) heart failure: Secondary | ICD-10-CM | POA: Diagnosis not present

## 2015-11-12 DIAGNOSIS — I428 Other cardiomyopathies: Secondary | ICD-10-CM

## 2015-11-12 NOTE — Progress Notes (Signed)
Remote pacemaker transmission.   

## 2015-11-13 ENCOUNTER — Encounter: Payer: Self-pay | Admitting: Cardiology

## 2015-11-13 NOTE — Progress Notes (Signed)
EPIC Encounter for ICM Monitoring  Patient Name: Alexandra [name] is a 82 y.o. female Date: 11/13/2015 Primary Care Physican: Melinda Crutch, MD Primary Cardiologist:Skains/Bensimhon Electrophysiologist: Caryl Comes Dry Weight:     unknown Bi-V Pacing:  >99%          Heart Failure questions reviewed, pt asymptomatic   Thoracic impedance normal   LABS: 07/21/2015 Creatinine 1.55, BUN 29, Potassium 3.8, Sodium 139 07/11/2015 Creatinine 1.65, BUN 27, Potassium 4.0, Sodium 138 06/23/2015 Creatinine 1.61, BUN 34, Potassium 4.1, Sodium 139 Creatinine ranged from 1.10 to 1.47 in 2016  Recommendations: No changes.  Advised to limit salt intake to 2000 mg daily.  Encouraged to call for fluid symptoms.    Follow-up plan: ICM clinic phone appointment on 12/18/2015.  Copy of ICM check sent to device physician.   ICM trend: 11/12/2015       [name], RN 11/13/2015 2:50 PM

## 2015-11-14 ENCOUNTER — Other Ambulatory Visit (HOSPITAL_COMMUNITY): Payer: Self-pay | Admitting: *Deleted

## 2015-11-14 MED ORDER — SPIRONOLACTONE 25 MG PO TABS: 25.0000 mg | ORAL_TABLET | Freq: Every day | ORAL | 4 refills | Status: DC

## 2015-11-21 ENCOUNTER — Other Ambulatory Visit: Payer: Self-pay | Admitting: *Deleted

## 2015-11-21 NOTE — Patient Outreach (Signed)
Telephone assessment. Pt still complaining of fatigue, not seeing any difference being on iron. She has seen Dr. Missy Sabins and no new orders have been given. She says she thinks Dr. Harrington Challenger wanted more lab work. I have made an appt to see her next Tuesday and will draw a CBC Iron Studies and BMET to follow up on her kidney status.  [name] [name] [phone]

## 2015-11-25 ENCOUNTER — Other Ambulatory Visit: Payer: Self-pay | Admitting: *Deleted

## 2015-11-25 DIAGNOSIS — N184 Chronic kidney disease, stage 4 (severe): Secondary | ICD-10-CM | POA: Diagnosis not present

## 2015-11-25 DIAGNOSIS — D508 Other iron deficiency anemias: Secondary | ICD-10-CM | POA: Diagnosis not present

## 2015-11-25 NOTE — Patient Outreach (Signed)
[place] [facility]) Care Management  11/25/2015  Alexandra VENEZIA 1927/11/30 [id number]   Home visit. Recheck for CKD and low iron saturation. Pt still complaining of low energy, no get up and go. She is very sedentary. She had 2 teeth extracted yesterday and is on amoxicillin. She says she will go to cardiac rehab which we have all been encouraging her to do this.  BP (!) 104/50 (BP Location: Left Arm, Patient Position: Sitting, Cuff Size: Normal)   Pulse 67   Resp 18   Wt 155 lb (70.3 kg)   BMI 26.19 kg/m  RRR Lungs are clear  A: CHF - stable     CKD     Low Iron Saturation  P: Drew BMET and Iron Saturation, took specimen to Dr. Harrington Challenger' office.      I will call pt in one month.  [name] [facility] [place] [phone]

## 2015-12-02 ENCOUNTER — Other Ambulatory Visit (HOSPITAL_COMMUNITY): Payer: Self-pay | Admitting: Student

## 2015-12-04 ENCOUNTER — Other Ambulatory Visit (HOSPITAL_COMMUNITY): Payer: Self-pay | Admitting: *Deleted

## 2015-12-04 MED ORDER — AMOXICILLIN 500 MG PO CAPS: ORAL_CAPSULE | ORAL | 99 refills | Status: DC

## 2015-12-09 ENCOUNTER — Other Ambulatory Visit (HOSPITAL_COMMUNITY): Payer: Self-pay | Admitting: Student

## 2015-12-11 LAB — CUP PACEART REMOTE DEVICE CHECK
Battery Remaining Percentage: 92 %
Brady Statistic AP VP Percent: 15 %
Date Time Interrogation Session: 20171025072248
Implantable Lead Implant Date: 20161229
Implantable Lead Location: 753858
Lead Channel Pacing Threshold Amplitude: 0.75 V
Lead Channel Pacing Threshold Pulse Width: 0.5 ms
Lead Channel Pacing Threshold Pulse Width: 0.5 ms
Lead Channel Pacing Threshold Pulse Width: 1 ms
Lead Channel Sensing Intrinsic Amplitude: 4 mV
Lead Channel Setting Pacing Amplitude: 2 V
Lead Channel Setting Pacing Amplitude: 2.875
Lead Channel Setting Pacing Pulse Width: 1 ms
MDC IDC LEAD IMPLANT DT: 20161229
MDC IDC LEAD IMPLANT DT: 20161229
MDC IDC LEAD LOCATION: 753859
MDC IDC LEAD LOCATION: 753860
MDC IDC MSMT BATTERY REMAINING LONGEVITY: 68 mo
MDC IDC MSMT BATTERY VOLTAGE: 2.96 V
MDC IDC MSMT LEADCHNL LV IMPEDANCE VALUE: 930 Ohm
MDC IDC MSMT LEADCHNL LV PACING THRESHOLD AMPLITUDE: 2.125 V
MDC IDC MSMT LEADCHNL RA IMPEDANCE VALUE: 430 Ohm
MDC IDC MSMT LEADCHNL RV IMPEDANCE VALUE: 650 Ohm
MDC IDC MSMT LEADCHNL RV PACING THRESHOLD AMPLITUDE: 0.875 V
MDC IDC MSMT LEADCHNL RV SENSING INTR AMPL: 12 mV
MDC IDC PG IMPLANT DT: 20161229
MDC IDC SET LEADCHNL RA PACING AMPLITUDE: 1.75 V
MDC IDC SET LEADCHNL RV PACING PULSEWIDTH: 0.5 ms
MDC IDC SET LEADCHNL RV SENSING SENSITIVITY: 2 mV
MDC IDC STAT BRADY AP VS PERCENT: 1 %
MDC IDC STAT BRADY AS VP PERCENT: 84 %
MDC IDC STAT BRADY AS VS PERCENT: 1 %
MDC IDC STAT BRADY RA PERCENT PACED: 15 %
Pulse Gen Model: 3262
Pulse Gen Serial Number: 7834528

## 2015-12-18 ENCOUNTER — Ambulatory Visit (INDEPENDENT_AMBULATORY_CARE_PROVIDER_SITE_OTHER): Payer: Commercial Managed Care - HMO

## 2015-12-18 DIAGNOSIS — I5022 Chronic systolic (congestive) heart failure: Secondary | ICD-10-CM | POA: Diagnosis not present

## 2015-12-18 DIAGNOSIS — Z950 Presence of cardiac pacemaker: Secondary | ICD-10-CM

## 2015-12-19 ENCOUNTER — Telehealth: Payer: Self-pay

## 2015-12-19 NOTE — Telephone Encounter (Signed)
Remote ICM transmission received.  Attempted patient call and left message to return call.   

## 2015-12-19 NOTE — Progress Notes (Signed)
EPIC Encounter for ICM Monitoring  Patient Name: Alexandra [name] is a 82 y.o. female Date: 12/19/2015 Primary Care Physican: Melinda Crutch, MD Primary Cardiologist:Skains/Bensimhon Electrophysiologist: Faustino Congress Weight:unknown Bi-V Pacing: >99%           Attempted ICM call and unable to reach. Left message to return call.  Transmission reviewed.   Thoracic impedance normal   LABS: 07/21/2015 Creatinine 1.55, BUN 29, Potassium 3.8, Sodium 139 07/11/2015 Creatinine 1.65, BUN 27, Potassium 4.0, Sodium 138 06/23/2015 Creatinine 1.61, BUN 34, Potassium 4.1, Sodium 139 Creatinine ranged from 1.10 to 1.47 in 2016  Recommendations:  N/A   Follow-up plan: ICM clinic phone appointment on 02/27/2016.  Office appointment with Dr Caryl Comes 01/26/2016  Copy of ICM check sent to device physician.   ICM trend: 12/18/2015       [name], RN 12/19/2015 8:36 AM

## 2015-12-20 DIAGNOSIS — J069 Acute upper respiratory infection, unspecified: Secondary | ICD-10-CM | POA: Diagnosis not present

## 2015-12-22 ENCOUNTER — Other Ambulatory Visit (HOSPITAL_COMMUNITY): Payer: Self-pay | Admitting: Internal Medicine

## 2015-12-22 ENCOUNTER — Other Ambulatory Visit: Payer: Self-pay | Admitting: *Deleted

## 2015-12-22 NOTE — Patient Outreach (Signed)
Telephone assessment. Pt repoirts she has had a cold. She went to the after hours clinic at [facility] on Saturday and was seen and treated. She was given a Z pack and cough syrup. She is starting to feel a bit better but still coughing quit a bit. I advised her to continue foillowing directions for her acute illness and I will call her back in 2 weeks.  Alexandra [name] [facility] [place] [phone]

## 2015-12-22 NOTE — Progress Notes (Addendum)
Patient returned call.  She stated visited MD for a cold and is feeling better.  No fluid symptoms and transmission reviewed.   Advised to call for any fluid symptoms and next transmission will be 02/26/2016 since she will have a office pacer check with Dr Caryl Comes on 01/26/2016. No changes today.   She gave verbal permission to leave detailed message on home and cell phone.

## 2016-01-01 ENCOUNTER — Other Ambulatory Visit: Payer: Self-pay | Admitting: *Deleted

## 2016-01-01 NOTE — Patient Outreach (Signed)
Telephone assessment. Pt did not answer the phone. I left a message and requested a return call.  [name] [facility] [place] [phone]

## 2016-01-04 ENCOUNTER — Other Ambulatory Visit (HOSPITAL_COMMUNITY): Payer: Self-pay | Admitting: Internal Medicine

## 2016-01-10 ENCOUNTER — Other Ambulatory Visit (HOSPITAL_COMMUNITY): Payer: Self-pay | Admitting: Internal Medicine

## 2016-01-10 DIAGNOSIS — I5022 Chronic systolic (congestive) heart failure: Secondary | ICD-10-CM

## 2016-01-17 ENCOUNTER — Other Ambulatory Visit (HOSPITAL_COMMUNITY): Payer: Self-pay | Admitting: Student

## 2016-01-21 ENCOUNTER — Other Ambulatory Visit (HOSPITAL_COMMUNITY): Payer: Self-pay | Admitting: Internal Medicine

## 2016-01-24 ENCOUNTER — Other Ambulatory Visit (HOSPITAL_COMMUNITY): Payer: Self-pay | Admitting: Internal Medicine

## 2016-01-26 ENCOUNTER — Ambulatory Visit (INDEPENDENT_AMBULATORY_CARE_PROVIDER_SITE_OTHER): Payer: Commercial Managed Care - HMO | Admitting: Internal Medicine

## 2016-01-26 ENCOUNTER — Encounter: Payer: Self-pay | Admitting: Internal Medicine

## 2016-01-26 ENCOUNTER — Encounter (INDEPENDENT_AMBULATORY_CARE_PROVIDER_SITE_OTHER): Payer: Self-pay

## 2016-01-26 VITALS — BP 140/50 | HR 74 | Ht 64.0 in | Wt 155.8 lb

## 2016-01-26 DIAGNOSIS — I447 Left bundle-branch block, unspecified: Secondary | ICD-10-CM | POA: Diagnosis not present

## 2016-01-26 DIAGNOSIS — Z950 Presence of cardiac pacemaker: Secondary | ICD-10-CM | POA: Diagnosis not present

## 2016-01-26 DIAGNOSIS — I5022 Chronic systolic (congestive) heart failure: Secondary | ICD-10-CM | POA: Diagnosis not present

## 2016-01-26 DIAGNOSIS — I428 Other cardiomyopathies: Secondary | ICD-10-CM | POA: Diagnosis not present

## 2016-01-26 LAB — CUP PACEART INCLINIC DEVICE CHECK
Brady Statistic RA Percent Paced: 17 %
Date Time Interrogation Session: 20180108180158
Implantable Lead Implant Date: 20161229
Implantable Lead Location: 753860
Lead Channel Impedance Value: 887.5 Ohm
Lead Channel Pacing Threshold Amplitude: 1 V
Lead Channel Pacing Threshold Amplitude: 1.75 V
Lead Channel Setting Pacing Amplitude: 2 V
Lead Channel Setting Pacing Amplitude: 2.875
Lead Channel Setting Pacing Pulse Width: 1 ms
MDC IDC LEAD IMPLANT DT: 20161229
MDC IDC LEAD IMPLANT DT: 20161229
MDC IDC LEAD LOCATION: 753858
MDC IDC LEAD LOCATION: 753859
MDC IDC MSMT BATTERY VOLTAGE: 2.96 V
MDC IDC MSMT LEADCHNL LV PACING THRESHOLD PULSEWIDTH: 1 ms
MDC IDC MSMT LEADCHNL RA IMPEDANCE VALUE: 425 Ohm
MDC IDC MSMT LEADCHNL RA PACING THRESHOLD AMPLITUDE: 1 V
MDC IDC MSMT LEADCHNL RA PACING THRESHOLD PULSEWIDTH: 0.5 ms
MDC IDC MSMT LEADCHNL RA SENSING INTR AMPL: 5 mV
MDC IDC MSMT LEADCHNL RV IMPEDANCE VALUE: 650 Ohm
MDC IDC MSMT LEADCHNL RV PACING THRESHOLD PULSEWIDTH: 0.5 ms
MDC IDC MSMT LEADCHNL RV SENSING INTR AMPL: 12 mV
MDC IDC PG IMPLANT DT: 20161229
MDC IDC SET LEADCHNL RA PACING AMPLITUDE: 1.75 V
MDC IDC SET LEADCHNL RV PACING PULSEWIDTH: 0.5 ms
MDC IDC SET LEADCHNL RV SENSING SENSITIVITY: 2 mV
MDC IDC STAT BRADY RV PERCENT PACED: 99.69 %
Pulse Gen Model: 3262
Pulse Gen Serial Number: 7834528

## 2016-01-26 NOTE — Progress Notes (Signed)
Patient Care Team: Lawerance Cruel, MD as PCP - General (Family Medicine) [name], NP as [place] [facility]   HPI  Alexandra [name] is a 83 y.o. female Seen following  CRT  implant  She has a history of nonischemic cardiomyopathy and left bundle branch block. It was initially thought to be related to [place] when she presented 7/16 EF at that time was 20%. She was admitted 9/16 with low output and was treated with milrinone,  down titration of which was required because of tachycardia. 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  She required outpatient milrinone area and this was ultimately stopped 2/17. She underwent right heart catheterization 7/17 with outstanding numbers. Low filling pressures.  She has prior problems with her PICC line. Recurrent nightly 2 blood cultures positive for diphtheroids and then one out of 2 positive for staph;  it was changed the first time but not the second time.  She originally did not respond significantly to  CRT. We reprorammed her AV delays to produce an upright QRs in lead V1; and  she subsequently underwent AV optimization by echo  It is her impression that she is some better following CRT but still with some fatigue.       Records and Results Reviewed HF clinic notes and cath details   Past Medical History:  Diagnosis Date  . Adult hypothyroidism 04/02/2014  . Anemia of chronic disease 08/13/2014  . Anxiety   . Arthritis   . Asthma   . breast ca dx'd 2000   left  . CAP (community acquired pneumonia) 12/19/2012  . Chest pain    NM stress test 05/2011 Normal  . CHF (congestive heart failure) ([place]) 08/19/2014  . CKD (chronic kidney disease) stage 3, GFR 30-59 ml/min 08/13/2014  . Depression   . DOE (dyspnea on exertion)    No SOB at rest  . Essential (primary) hypertension 04/02/2014  . Fatigue    excertional  . RWERXVQM(086.7)   . Hereditary and idiopathic peripheral neuropathy 07/02/2014  . Hypercholesteremia   . Intermittent LBBB (left bundle branch block) 12/21/2012  . Leg weakness   . Neuropathy ([place])   . Obesity   . Osteopenia   . Peripheral neuropathy ([place])   . Presence of permanent cardiac pacemaker 01/16/2015   BIV  . Sacroiliitis, not elsewhere classified ([place])   . Situational depression   . Trigger finger    [name]    Past Surgical History:  Procedure Laterality Date  . ACHILLES TENDON SURGERY    . BREAST LUMPECTOMY     breast cancer  . CARDIAC CATHETERIZATION N/A 07/21/2015   Procedure: Right Heart Cath;  Surgeon: Jolaine Artist, MD;  Location: [facility];  Service: Cardiovascular;  Laterality: N/A;  . CHOLECYSTECTOMY N/A 08/13/2014   Procedure: LAPAROSCOPIC CHOLECYSTECTOMY;  Surgeon: Stark Kinsly Hild, MD;  Location: [facility];  Service: General;  Laterality: N/A;  . EP IMPLANTABLE DEVICE N/A 01/16/2015   Procedure: BiV Pacemaker Insertion CRT-P;  Surgeon: Deboraha Sprang, MD;  Location: [place] [facility];  Service: Cardiovascular;  Laterality: N/A;  . LEG SURGERY    . LUMBAR LAMINECTOMY    . POLYPECTOMY    . TONSILLECTOMY    . VESICOVAGINAL FISTULA CLOSURE W/ TAH     DC hysterectomy    Current Outpatient Prescriptions  Medication Sig Dispense Refill  . acetaminophen (TYLENOL)  500 MG tablet Take 500 mg by mouth every 6 (six) hours as needed for mild pain or headache. Reported on 06/17/2015    . albuterol (PROVENTIL HFA;VENTOLIN HFA) 108 (90 BASE) MCG/ACT inhaler Inhale 2 puffs into the lungs every 6 (six) hours as needed for wheezing or shortness of breath. Reported on 05/27/2015    . ALPRAZolam (XANAX) 0.25 MG tablet Take 0.25 mg by mouth 3 (three) times daily as needed for anxiety. Reported on 05/27/2015    . amoxicillin (AMOXIL) 500 MG capsule Take 4 capsules by mouth as needed 1 hour before dental work 4 capsule prn  . apixaban (ELIQUIS) 2.5 MG TABS  tablet Take 1 tablet (2.5 mg total) by mouth 2 (two) times daily. 60 tablet 11  . b complex vitamins tablet Take 1 tablet by mouth daily with lunch.     . calcium carbonate (TUMS - DOSED IN MG ELEMENTAL CALCIUM) 500 MG chewable tablet Chew 2 tablets by mouth daily as needed for indigestion or heartburn. Reported on 07/18/2015    . Calcium Carbonate-Vitamin D (CALTRATE 600+D PO) Take 1 tablet by mouth daily.     . cholecalciferol (VITAMIN D) 1000 UNITS tablet Take 1,000 Units by mouth daily with lunch.     . citalopram (CELEXA) 20 MG tablet TAKE 1 TABLET(20 MG) BY MOUTH DAILY 30 tablet 0  . CORLANOR 5 MG TABS tablet TAKE 1 TABLET BY MOUTH TWICE DAILY WITH A MEAL.. 60 tablet 0  . DIGOX 125 MCG tablet TAKE 1/2 TABLET BY MOUTH EVERY DAY 15 tablet 3  . ferrous sulfate 325 (65 FE) MG tablet Take 325 mg by mouth daily with breakfast.    . furosemide (LASIX) 40 MG tablet TAKE 1 TABLET BY MOUTH DAILY 30 tablet 3  . hydrALAZINE (APRESOLINE) 25 MG tablet TAKE 1 TABLET BY MOUTH THREE TIMES DAILY 90 tablet 0  . lactose free nutrition (BOOST) LIQD Take 237 mLs by mouth daily with breakfast.     . levothyroxine (SYNTHROID, LEVOTHROID) 25 MCG tablet Take 25 mcg by mouth daily before breakfast. for thyroid    . MAGNESIUM-OXIDE 400 (241.3 Mg) MG tablet TAKE 1/2 TABLET(200 MG) BY MOUTH DAILY 15 tablet 0  . Multiple Vitamin (MULITIVITAMIN WITH MINERALS) TABS Take 1 tablet by mouth daily with lunch.     . nitroGLYCERIN (NITROSTAT) 0.4 MG SL tablet Place 1 tablet (0.4 mg total) under the tongue every 5 (five) minutes as needed for chest pain. 25 tablet 4  . pantoprazole (PROTONIX) 40 MG tablet Take 40 mg by mouth daily. Reported on 07/01/2015    . Polyethyl Glycol-Propyl Glycol (SYSTANE OP) Place 1 drop into both eyes 2 (two) times daily.    . potassium chloride (K-DUR) 10 MEQ tablet TAKE 1 TABLET BY MOUTH DAILY 30 tablet 3  . potassium chloride (K-DUR) 10 MEQ tablet TAKE 1 TABLET BY MOUTH DAILY 30 tablet 0  .  spironolactone (ALDACTONE) 25 MG tablet Take 1 tablet (25 mg total) by mouth daily. 30 tablet 4   No current facility-administered medications for this visit.     Allergies  Allergen Reactions  . Amiodarone Shortness Of Breath and Nausea And Vomiting  . Cymbalta [Duloxetine Hcl] Other (See Comments)    Depression  . Lyrica [Pregabalin] Other (See Comments)    Blurry vision      Review of Systems negative except from HPI and PMH  Physical Exam BP (!) 140/50   Pulse 74   Ht 5\' 4"  (1.626 m)   Wt  155 lb 12.8 oz (70.7 kg)   SpO2 92%   BMI 26.74 kg/m  Well developed and well nourished in no acute distress HENT normal E scleral and icterus clear Neck Supple JVP flat; carotids brisk and full Clear to ausculation  Regular rate and rhythm, no murmurs gallops or rub Soft with active bowel sounds No clubbing cyanosis  Edema Alert and oriented, grossly normal motor and sensory function Skin Warm and Dry  ECG demonstrates sinus rhythm with PVCs synchronous pacing QRS duration is 124 ms and is upright in lead V2 and lead 1 and biphasic in lead V1  Assessment and  Plan Nonischemic cardiomyopathy  Congestive heart failure-chronic-systolic  ICD-CRT-D-St. Jude's  Sinus tachycardia  High Risk Medication Surveillance  Will check dig and potassium levels on aldactone  On Anticoagulation;  No bleeding issues -- but I cant figure out from record why this was initiated 4/17  ,Euvolemic continue current meds  No clear med culprits for fatigue  Her RHC suggested somewhat hypovolemic  HR excursion well attenuated by the ivabradine    Current medicines are reviewed at length with the patient today .  The patient does not  have concerns regarding medicines.

## 2016-01-26 NOTE — Patient Instructions (Signed)
Medication Instructions: - Your physician recommends that you continue on your current medications as directed. Please refer to the Current Medication list given to you today.  Labwork: - Your physician recommends that you have lab work today: BMP/ CBC/ Digoxin  Procedures/Testing: - none ordered  Follow-Up: - Remote monitoring is used to monitor your Pacemaker of ICD from home. This monitoring reduces the number of office visits required to check your device to one time per year. It allows [place] to keep an eye on the functioning of your device to ensure it is working properly. You are scheduled for a device check from home on 04/26/16. You may send your transmission at any time that day. If you have a wireless device, the transmission will be sent automatically. After your physician reviews your transmission, you will receive a postcard with your next transmission date.  - Your physician wants you to follow-up in: 1 year with Chanetta Marshall, NP for Dr. Caryl Comes. You will receive a reminder letter in the mail two months in advance. If you don't receive a letter, please call our office to schedule the follow-up appointment.  Any Additional Special Instructions Will Be Listed Below (If Applicable).     If you need a refill on your cardiac medications before your next appointment, please call your pharmacy.

## 2016-01-27 LAB — CBC WITH DIFFERENTIAL/PLATELET
BASOS ABS: 0 10*3/uL (ref 0.0–0.2)
BASOS: 1 %
EOS (ABSOLUTE): 0.1 10*3/uL (ref 0.0–0.4)
EOS: 2 %
HEMATOCRIT: 36.1 % (ref 34.0–46.6)
HEMOGLOBIN: 12.2 g/dL (ref 11.1–15.9)
IMMATURE GRANS (ABS): 0 10*3/uL (ref 0.0–0.1)
Immature Granulocytes: 0 %
LYMPHS: 34 %
Lymphocytes Absolute: 2.3 10*3/uL (ref 0.7–3.1)
MCH: 31.7 pg (ref 26.6–33.0)
MCHC: 33.8 g/dL (ref 31.5–35.7)
MCV: 94 fL (ref 79–97)
MONOCYTES: 7 %
Monocytes Absolute: 0.5 10*3/uL (ref 0.1–0.9)
NEUTROS ABS: 3.9 10*3/uL (ref 1.4–7.0)
Neutrophils: 56 %
Platelets: 225 10*3/uL (ref 150–379)
RBC: 3.85 x10E6/uL (ref 3.77–5.28)
RDW: 14.4 % (ref 12.3–15.4)
WBC: 6.8 10*3/uL (ref 3.4–10.8)

## 2016-01-27 LAB — BASIC METABOLIC PANEL
BUN / CREAT RATIO: 27 (ref 12–28)
BUN: 40 mg/dL — AB (ref 8–27)
CALCIUM: 9.8 mg/dL (ref 8.7–10.3)
CHLORIDE: 94 mmol/L — AB (ref 96–106)
CO2: 27 mmol/L (ref 18–29)
CREATININE: 1.5 mg/dL — AB (ref 0.57–1.00)
GFR, EST AFRICAN AMERICAN: 36 mL/min/{1.73_m2} — AB (ref >59)
GFR, EST NON AFRICAN AMERICAN: 31 mL/min/{1.73_m2} — AB (ref >59)
Glucose: 104 mg/dL — ABNORMAL HIGH (ref 65–99)
Potassium: 3.8 mmol/L (ref 3.5–5.2)
Sodium: 141 mmol/L (ref 134–144)

## 2016-01-27 LAB — DIGOXIN LEVEL: DIGOXIN, SERUM: 0.8 ng/mL (ref 0.5–0.9)

## 2016-01-30 ENCOUNTER — Other Ambulatory Visit: Payer: Self-pay | Admitting: *Deleted

## 2016-01-31 NOTE — Patient Outreach (Signed)
Telephone assessment. I called and was not able to speak with Alexandra [name]. I left a message asking for her to return my call and let me know how she is doing.  [name] [facility] [place] [phone]

## 2016-02-02 ENCOUNTER — Other Ambulatory Visit: Payer: Self-pay | Admitting: *Deleted

## 2016-02-02 NOTE — Patient Outreach (Addendum)
Telephone assessment attempted. I left a message for pt to call me an update me on her health status.  [name] [facility] [place] [phone]  Mrs. Grieb called me back to report that she is doing well. She continues to be very easily fatigued. She has had her cardiac check up and labs drawn and everything looked stable.  I have advised her my position is going to be evolving and I will be working with Dr. Missy Sabins with pt's in their homes. She is also a patient of his.  I will be keeping contact with her monthly by phone for now.  [name] [facility] [place] [phone]

## 2016-02-02 NOTE — Patient Outreach (Signed)
See other notes from this date.  [name] [facility] [place] [phone]

## 2016-02-03 ENCOUNTER — Telehealth: Payer: Self-pay

## 2016-02-03 NOTE — Telephone Encounter (Signed)
Pt is aware of lab results. Instructed patient to take Furosemide 40 mg every other day instead of daily. Also told patient to monitor weight gain due to medication change, if she notices a weight gain of 3-5 lbs in a day to call office. Pt was agreeable to change in therapy

## 2016-02-04 ENCOUNTER — Other Ambulatory Visit (HOSPITAL_COMMUNITY): Payer: Self-pay | Admitting: Internal Medicine

## 2016-02-06 ENCOUNTER — Other Ambulatory Visit: Payer: Self-pay | Admitting: *Deleted

## 2016-02-06 MED ORDER — FUROSEMIDE 40 MG PO TABS: ORAL_TABLET | ORAL | Status: DC

## 2016-02-21 ENCOUNTER — Other Ambulatory Visit (HOSPITAL_COMMUNITY): Payer: Self-pay | Admitting: Internal Medicine

## 2016-02-25 ENCOUNTER — Other Ambulatory Visit (HOSPITAL_COMMUNITY): Payer: Self-pay | Admitting: Student

## 2016-02-27 ENCOUNTER — Ambulatory Visit (INDEPENDENT_AMBULATORY_CARE_PROVIDER_SITE_OTHER): Payer: Medicare HMO

## 2016-02-27 ENCOUNTER — Other Ambulatory Visit (HOSPITAL_COMMUNITY): Payer: Self-pay | Admitting: Internal Medicine

## 2016-02-27 ENCOUNTER — Telehealth: Payer: Self-pay

## 2016-02-27 ENCOUNTER — Telehealth: Payer: Self-pay | Admitting: Cardiology

## 2016-02-27 DIAGNOSIS — Z950 Presence of cardiac pacemaker: Secondary | ICD-10-CM

## 2016-02-27 DIAGNOSIS — I5022 Chronic systolic (congestive) heart failure: Secondary | ICD-10-CM

## 2016-02-27 NOTE — Telephone Encounter (Signed)
LMOVM reminding pt to send remote transmission.   

## 2016-02-27 NOTE — Telephone Encounter (Signed)
Remote ICM transmission received.  Attempted patient call and left message to return call.   

## 2016-02-27 NOTE — Progress Notes (Signed)
EPIC Encounter for ICM Monitoring  Patient Name: Alexandra [name] is a 83 y.o. female Date: 02/27/2016 Primary Care Physican: Melinda Crutch, MD Primary Cardiologist:Skains/Bensimhon Electrophysiologist: Faustino Congress Weight:unknown Bi-V Pacing: >99%       Attempted call to patient and unable to reach.  Left message to return call.  Transmission reviewed.   Thoracic impedance normal but was abnormal suggesting fluid accumulation from 01/30/16 to 02/14/16.  LABS: 07/21/2015 Creatinine 1.55, BUN 29, Potassium 3.8, Sodium 139 07/11/2015 Creatinine 1.65, BUN 27, Potassium 4.0, Sodium 138 06/23/2015 Creatinine 1.61, BUN 34, Potassium 4.1, Sodium 139 Creatinine ranged from 1.10 to 1.47 in 2016  Recommendations: NONE - Unable to reach patient   Follow-up plan: ICM clinic phone appointment on 03/29/2016.  Copy of ICM check sent to device physician.   3 month ICM trend: 02/27/2016   1 Year ICM trend:      [name], RN 02/27/2016 11:30 AM

## 2016-02-27 NOTE — Progress Notes (Signed)
Patient returned call and denied any fluid symptoms.  She stated she feels fine at this time.  Transmission reviewed and she stated probably the food she has been eating causing some fluid accumulation.  Discussed salt and fluid intake.  No changes today.  Current weight is 157 lbs.  Next ICM remote transmission 03/29/2016.

## 2016-03-05 ENCOUNTER — Other Ambulatory Visit (HOSPITAL_COMMUNITY): Payer: Self-pay | Admitting: Internal Medicine

## 2016-03-05 DIAGNOSIS — I5022 Chronic systolic (congestive) heart failure: Secondary | ICD-10-CM

## 2016-03-23 ENCOUNTER — Other Ambulatory Visit (HOSPITAL_COMMUNITY): Payer: Self-pay | Admitting: Internal Medicine

## 2016-03-26 ENCOUNTER — Telehealth: Payer: Self-pay

## 2016-03-26 NOTE — Telephone Encounter (Signed)
Pt has called here this am and states she is ready to join the PREP.  States she had had some dental work that kept her from coming in to start in July 2017. States she also has transportation issues and will try and make it in on 04/02/16 at 10:30 to register.  Will consult with [facility] to inquire about possible transportation assistance.

## 2016-03-29 ENCOUNTER — Other Ambulatory Visit (HOSPITAL_COMMUNITY): Payer: Self-pay | Admitting: Student

## 2016-03-29 ENCOUNTER — Ambulatory Visit (INDEPENDENT_AMBULATORY_CARE_PROVIDER_SITE_OTHER): Payer: Medicare HMO

## 2016-03-29 ENCOUNTER — Other Ambulatory Visit: Payer: Self-pay | Admitting: *Deleted

## 2016-03-29 DIAGNOSIS — Z950 Presence of cardiac pacemaker: Secondary | ICD-10-CM | POA: Diagnosis not present

## 2016-03-29 DIAGNOSIS — I5022 Chronic systolic (congestive) heart failure: Secondary | ICD-10-CM | POA: Diagnosis not present

## 2016-03-29 NOTE — Patient Outreach (Addendum)
Telephone assessment attempted. There was no answer and I was unable to leave a message. I will call Alexandra [name] again tomorrow.  [name] [facility] [place] [phone]  Alexandra [name] returned my call and I advised her I have the new ReDS Fluid Monitoring System Vest and that I would like to visit her and do an evaluation with this. She reports her St. Jude - CRPT device is able to monitor her fluid status and is worried that the vest may interfere. I told her that I had been told that as long as her device is located in the left side of her chest it will be OK, but she is still pensive and wants me to ask Dr. Missy Sabins specifically if it will be OK for her. I told her I would do that and call her next week. She was happy with this arrangement.  [name] [facility] [employer] [phone]

## 2016-03-30 ENCOUNTER — Other Ambulatory Visit: Payer: Self-pay | Admitting: *Deleted

## 2016-03-30 NOTE — Progress Notes (Signed)
EPIC Encounter for ICM Monitoring  Patient Name: Alexandra [name] is a 83 y.o. female Date: 03/30/2016 Primary Care Physican: Melinda Crutch, MD Primary Cardiologist:Skains/Bensimhon Electrophysiologist: Caryl Comes Dry Weight:unknown Bi-V Pacing: >99%       Transmission reviewed.   Thoracic impedance normal.  Prescribed dosage: Furosemide 40 mg 1 tablet every other day  LABS: 01/27/2016 Creatinine 1.50, BUN 40, Potassium 3.8, Sodium 141, EGFR 31-36 07/21/2015 Creatinine 1.55, BUN 29, Potassium 3.8, Sodium 139 07/11/2015 Creatinine 1.65, BUN 27, Potassium 4.0, Sodium 138 06/23/2015 Creatinine 1.61, BUN 34, Potassium 4.1, Sodium 139 Creatinine ranged from 1.10 to 1.47 in 2016  Recommendations: No changes.   Follow-up plan: ICM clinic phone appointment on 05/03/2016.  Copy of ICM check sent to device physician.   3 month ICM trend: 03/29/2016   1 Year ICM trend:      [name], RN 03/30/2016 2:07 PM

## 2016-03-30 NOTE — Patient Outreach (Signed)
Telephone assessment. Alexandra [name] is very cheerful today and states she is doing well. She has had some dental work done and now that it has been completed she is starting cardiac rehab. She reports her weight is stable, no SOB at rest, only when some exertion and she has no edema.  I advised her I will be continuing to call and check on her monthly and asked her to please call me if any problems arise.  [name] [facility] [place] [phone]

## 2016-04-01 ENCOUNTER — Other Ambulatory Visit (HOSPITAL_COMMUNITY): Payer: Self-pay | Admitting: Cardiology

## 2016-04-01 MED ORDER — APIXABAN 2.5 MG PO TABS: 2.5000 mg | ORAL_TABLET | Freq: Two times a day (BID) | ORAL | 3 refills | Status: DC

## 2016-04-01 NOTE — Telephone Encounter (Signed)
Medication PAP RX

## 2016-04-02 DIAGNOSIS — R6889 Other general symptoms and signs: Secondary | ICD-10-CM | POA: Diagnosis not present

## 2016-04-02 NOTE — Progress Notes (Signed)
[facility] Report   Patient Details  Name: Alexandra [name] MRN: [id number] Date of Birth: Sep 20, 1927 Age: 83 y.o. PCP: Melinda Crutch, MD  Vitals:   04/02/16 1137  BP: 132/68  Pulse: 84  Resp: 18  SpO2: 96%  Weight: 159 lb 6.4 oz (72.3 kg)  Height: 5\' 4"  (1.626 m)         [facility] Eval - 04/02/16 1100      Referral    Referring Provider Dr. Haroldine Laws   Reason for referral Inactivity;Other;Heart Failure   Program Start Date 04/02/16     Measurement   Waist Circumference 34 inches   Hip Circumference 40 inches     Information for Trainer   Goals "To gain strength , stability & improve heart function.  To feel comfortable driving again."   Current Exercise leg & arm exercises seated   Orthopedic Concerns knees/back   Pertinent Medical History CHF, thyroid   Current Barriers transportation   Restrictions/Precautions Fall risk     Timed Up and Go (TUGS)   Timed Up and Go High risk >13 seconds  18.48     Mobility and Daily Activities   I find it easy to walk up or down two or more flights of stairs. 1   I have no trouble taking out the trash. 1   I do housework such as vacuuming and dusting on my own without difficulty. 1   I can easily lift a gallon of milk (8lbs). 3   I can easily walk a mile. 1   I have no trouble reaching into high cupboards or reaching down to pick up something from the floor. 4   I do not have trouble doing out-door work such as Armed forces logistics/support/administrative officer, raking leaves, or gardening. 1     Mobility and Daily Activities   I feel younger than my age. 3   I feel independent. 2   I feel energetic. 1   I live an active life.  3   I feel strong. 1   I feel healthy. 1   I feel active as other people my age. 3     How fit and strong are you.   Fit and Strong Total Score 26     Past Medical History:  Diagnosis Date  . Adult hypothyroidism 04/02/2014  . Anemia of chronic disease 08/13/2014  . Anxiety   . Arthritis   . Asthma   . breast  ca dx'd 2000   left  . CAP (community acquired pneumonia) 12/19/2012  . Chest pain    NM stress test 05/2011 Normal  . CHF (congestive heart failure) ([place]) 08/19/2014  . CKD (chronic kidney disease) stage 3, GFR 30-59 ml/min 08/13/2014  . Depression   . DOE (dyspnea on exertion)    No SOB at rest  . Essential (primary) hypertension 04/02/2014  . Fatigue    excertional  . IWPYKDXI(338.2)   . Hereditary and idiopathic peripheral neuropathy 07/02/2014  . Hypercholesteremia   . Intermittent LBBB (left bundle branch block) 12/21/2012  . Leg weakness   . Neuropathy ([place])   . Obesity   . Osteopenia   . Peripheral neuropathy ([place])   . Presence of permanent cardiac pacemaker 01/16/2015   BIV  . Sacroiliitis, not elsewhere classified ([place])   . Situational depression   . Trigger finger    [name]   Past Surgical History:  Procedure Laterality Date  . ACHILLES TENDON SURGERY    .  BREAST LUMPECTOMY     breast cancer  . CARDIAC CATHETERIZATION N/A 07/21/2015   Procedure: Right Heart Cath;  Surgeon: Jolaine Artist, MD;  Location: [place] [facility];  Service: Cardiovascular;  Laterality: N/A;  . CHOLECYSTECTOMY N/A 08/13/2014   Procedure: LAPAROSCOPIC CHOLECYSTECTOMY;  Surgeon: Stark Klein, MD;  Location: [facility];  Service: General;  Laterality: N/A;  . EP IMPLANTABLE DEVICE N/A 01/16/2015   Procedure: BiV Pacemaker Insertion CRT-P;  Surgeon: Deboraha Sprang, MD;  Location: [place] [facility];  Service: Cardiovascular;  Laterality: N/A;  . LEG SURGERY    . LUMBAR LAMINECTOMY    . POLYPECTOMY    . TONSILLECTOMY    . VESICOVAGINAL FISTULA CLOSURE W/ TAH     DC hysterectomy   History  Smoking Status  . Never Smoker  Smokeless Tobacco  . Never Used    Gregory has reached out to me about starting the PREP after having spoken to her a few times since 07/2015.  Her current barrier may be transportation as she doesn't feel comfortable driving.  Temporarily she is using her [employer] benefits  for transportation but a roundtrip ride counts as 2 rides and she has a total of 12 per year.  I have reached out to my resource people to see of other transportation options.  She seems excited to get started and will be coming to the Wed 11-noon classes starting on 04/07/16.   [name] 04/02/2016, 11:46 AM

## 2016-04-03 ENCOUNTER — Other Ambulatory Visit (HOSPITAL_COMMUNITY): Payer: Self-pay | Admitting: Student

## 2016-04-06 ENCOUNTER — Other Ambulatory Visit (HOSPITAL_COMMUNITY): Payer: Self-pay

## 2016-04-07 ENCOUNTER — Telehealth (HOSPITAL_COMMUNITY): Payer: Self-pay | Admitting: *Deleted

## 2016-04-07 NOTE — Telephone Encounter (Signed)
Completed forms for Patient assistance program for Eliquis completed and faxed with prescription to [facility] today to [phone].

## 2016-04-08 ENCOUNTER — Other Ambulatory Visit (HOSPITAL_COMMUNITY): Payer: Self-pay | Admitting: Student

## 2016-04-12 ENCOUNTER — Telehealth (HOSPITAL_COMMUNITY): Payer: Self-pay | Admitting: Cardiology

## 2016-04-12 MED ORDER — DIGOXIN 125 MCG PO TABS: 62.5000 ug | ORAL_TABLET | Freq: Every day | ORAL | 3 refills | Status: DC

## 2016-04-12 NOTE — Telephone Encounter (Signed)
Unclear if patient should continue dig, OV on 10/13/15 says to D/C dig however every encounter since then has digoxin on medication list.  Dig level 01/26/16     0.8    Will send to provider

## 2016-04-12 NOTE — Telephone Encounter (Signed)
Faxed refill request addressed

## 2016-04-14 ENCOUNTER — Other Ambulatory Visit (HOSPITAL_COMMUNITY): Payer: Self-pay | Admitting: Student

## 2016-04-14 DIAGNOSIS — R6889 Other general symptoms and signs: Secondary | ICD-10-CM | POA: Diagnosis not present

## 2016-04-16 NOTE — Progress Notes (Signed)
[facility] PREP Weekly Session   Patient Details  Name: Alexandra [name] MRN: [id number] Date of Birth: Jul 18, 1927 Age: 83 y.o. PCP: Melinda Crutch, MD  Vitals:   04/16/16 1400  Weight: 157 lb (71.2 kg)        [facility] Weekly seesion - 04/16/16 1400      Weekly Session   Topic Discussed Expectations and non-scale victories   Classes attended to date 1   Comments first class     Fun things you did since last meeting:"out to lunch and church" Things you are grateful for:"my life" Barriers/struggles:"walking"  [name] 04/16/2016, 2:01 PM

## 2016-04-19 ENCOUNTER — Other Ambulatory Visit (HOSPITAL_COMMUNITY): Payer: Self-pay | Admitting: Pharmacist

## 2016-04-19 ENCOUNTER — Telehealth (HOSPITAL_COMMUNITY): Payer: Self-pay | Admitting: Pharmacist

## 2016-04-19 MED ORDER — APIXABAN 2.5 MG PO TABS: 2.5000 mg | ORAL_TABLET | Freq: Two times a day (BID) | ORAL | 11 refills | Status: DC

## 2016-04-19 NOTE — Telephone Encounter (Signed)
[employer] patient assistance for Eliquis denied. Patient needs to send in 2018 pharmacy print out showing that she has spent 3% of her annual income on her medications for the year. [employer] received 2017 print out. Relayed info to Ms. Stanard who will send in 2018 print out ($547.85).   [name]. Velva Harman, PharmD, BCPS, CPP Clinical Pharmacist Pager: [phone] Phone: [phone] 04/19/2016 2:53 PM

## 2016-04-27 DIAGNOSIS — I10 Essential (primary) hypertension: Secondary | ICD-10-CM | POA: Diagnosis not present

## 2016-04-27 DIAGNOSIS — N184 Chronic kidney disease, stage 4 (severe): Secondary | ICD-10-CM | POA: Diagnosis not present

## 2016-04-27 DIAGNOSIS — I509 Heart failure, unspecified: Secondary | ICD-10-CM | POA: Diagnosis not present

## 2016-04-27 DIAGNOSIS — F430 Acute stress reaction: Secondary | ICD-10-CM | POA: Diagnosis not present

## 2016-04-27 DIAGNOSIS — E559 Vitamin D deficiency, unspecified: Secondary | ICD-10-CM | POA: Diagnosis not present

## 2016-04-27 DIAGNOSIS — Z0000 Encounter for general adult medical examination without abnormal findings: Secondary | ICD-10-CM | POA: Diagnosis not present

## 2016-04-27 DIAGNOSIS — K219 Gastro-esophageal reflux disease without esophagitis: Secondary | ICD-10-CM | POA: Diagnosis not present

## 2016-04-27 DIAGNOSIS — D508 Other iron deficiency anemias: Secondary | ICD-10-CM | POA: Diagnosis not present

## 2016-04-27 DIAGNOSIS — M8580 Other specified disorders of bone density and structure, unspecified site: Secondary | ICD-10-CM | POA: Diagnosis not present

## 2016-04-28 DIAGNOSIS — R6889 Other general symptoms and signs: Secondary | ICD-10-CM | POA: Diagnosis not present

## 2016-04-28 IMAGING — CT CT ANGIO CHEST
2 of 6 series · 18 of 36 positions shown · IV contrast (OMNIPAQUE 350)
Comparison: Chest radiographs obtained earlier today. Chest CT
dated 09/19/2003.

CLINICAL DATA: Progressive shortness of breath following a
cholecystectomy 1 week ago.

EXAM:
CT ANGIOGRAPHY CHEST WITH CONTRAST
TECHNIQUE: Multidetector CT imaging of the chest was performed using the
standard protocol during bolus administration of intravenous
contrast. Multiplanar CT image reconstructions and MIPs were
obtained to evaluate the vascular anatomy.
CONTRAST:  80mL OMNIPAQUE IOHEXOL 350 MG/ML SOLN

[Series 5: coronal mpr · coronal · 0.42mm/px · 1 of 115 slices shown]
[im 58/115  mediastinal]
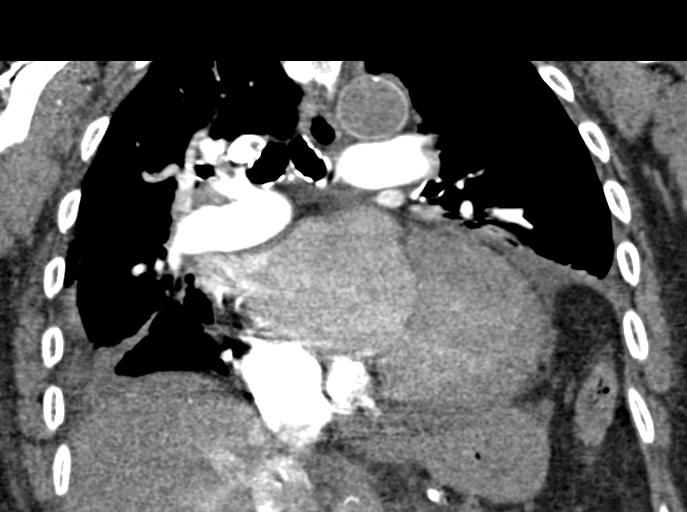

[Series 10: thins for pacs · axial · 0.74mm/px · z∈[+1368,+1561]mm · 17 of 215 slices shown]
[im 11/215  lung]
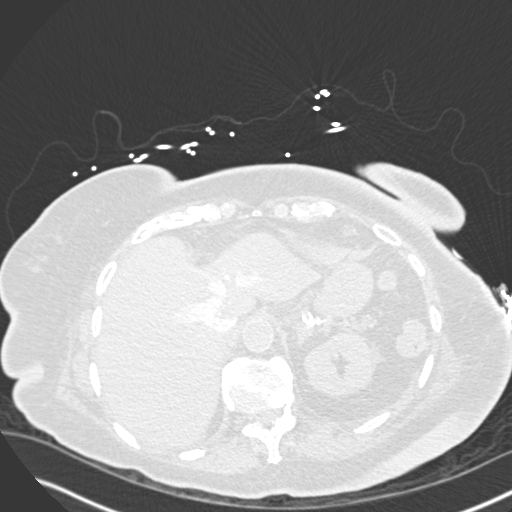
[im 22/215  mediastinal]
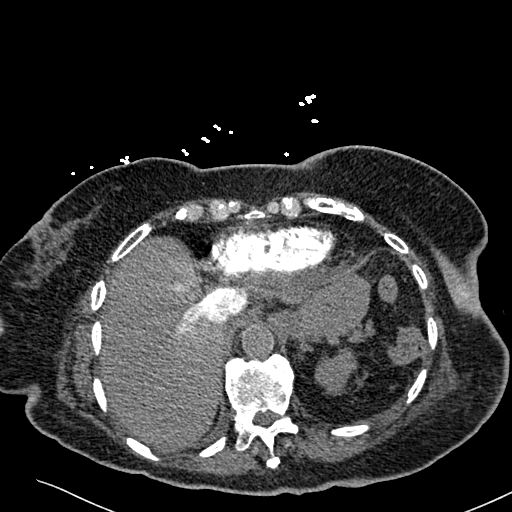
[im 33/215  lung]
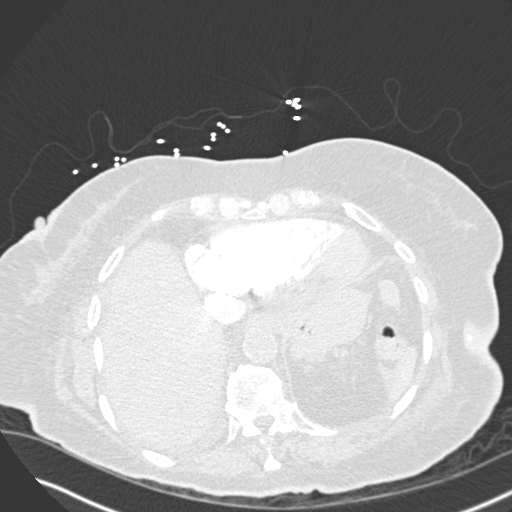
[im 43/215  mediastinal]
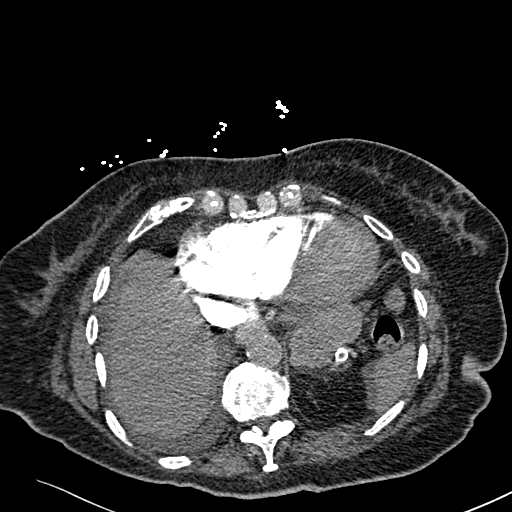
[im 65/215  lung]
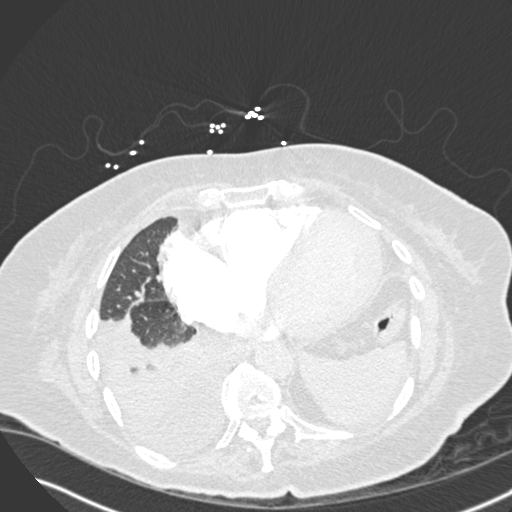
[im 75/215  mediastinal]
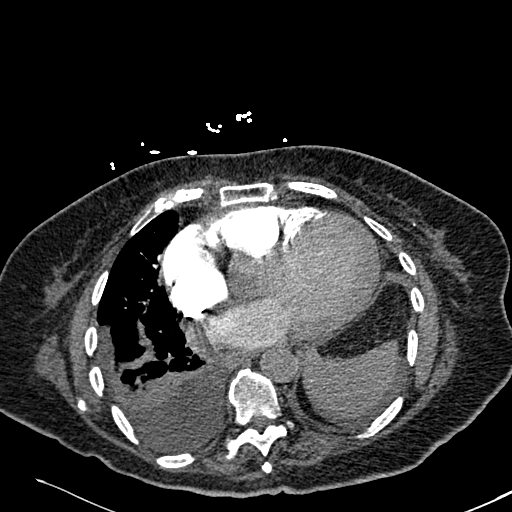
[im 86/215  lung]
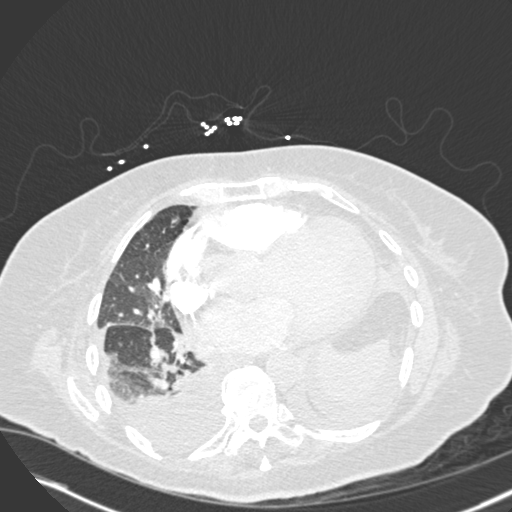
[im 97/215  mediastinal]
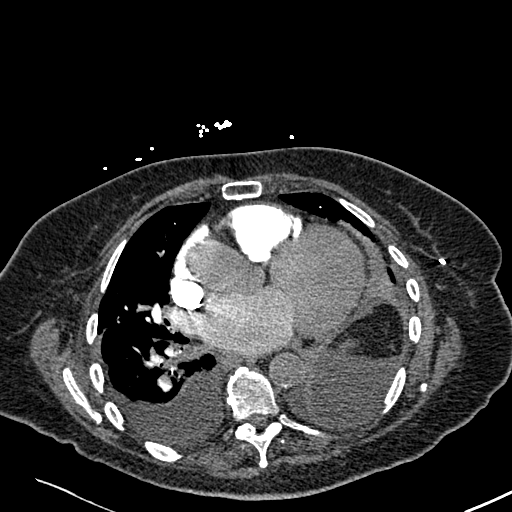
[im 108/215  lung]
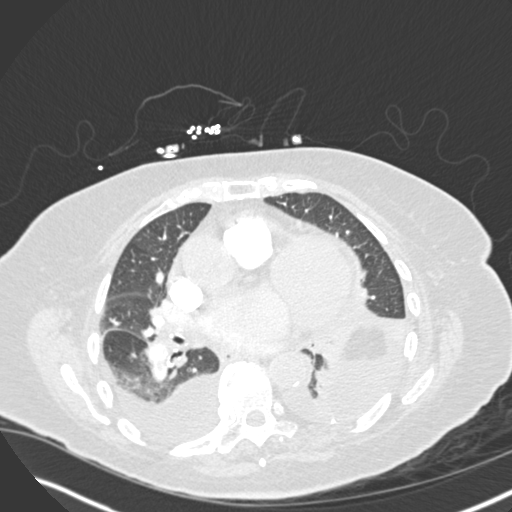
[im 118/215  mediastinal]
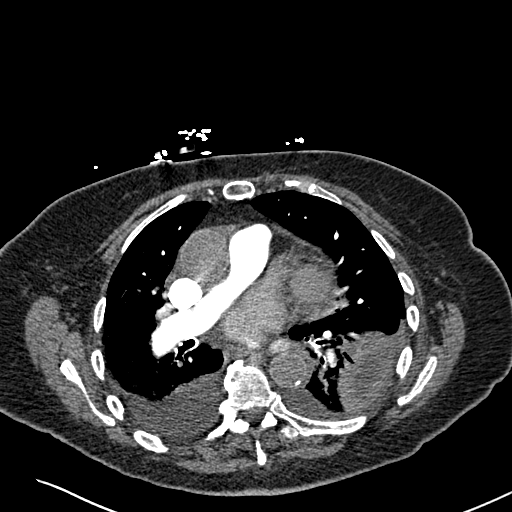
[im 129/215  lung]
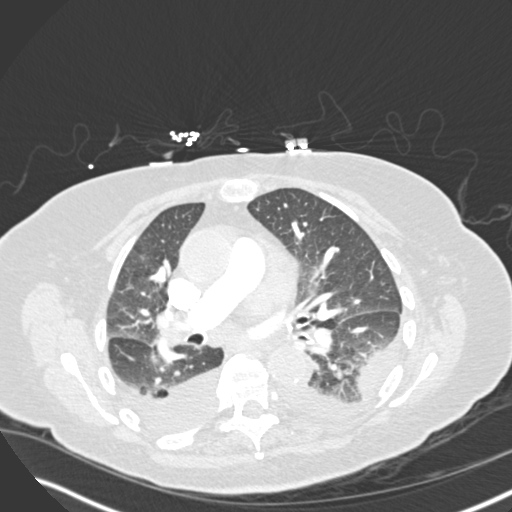
[im 140/215  mediastinal]
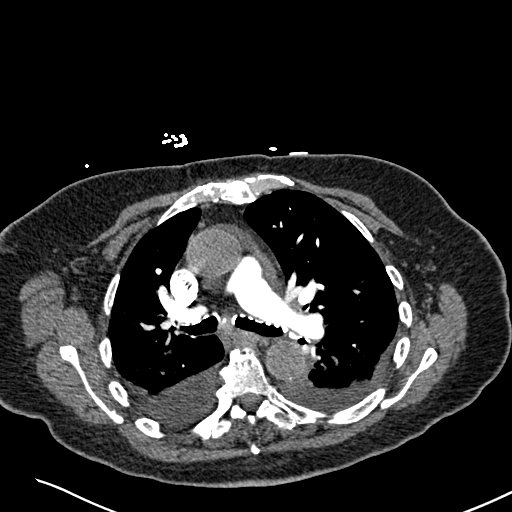
[im 150/215  lung]
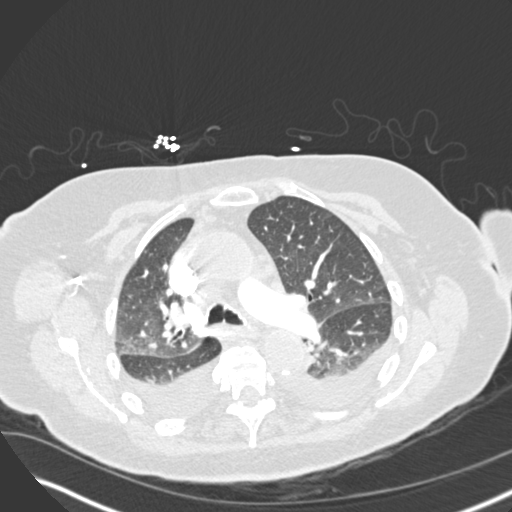
[im 172/215  mediastinal]
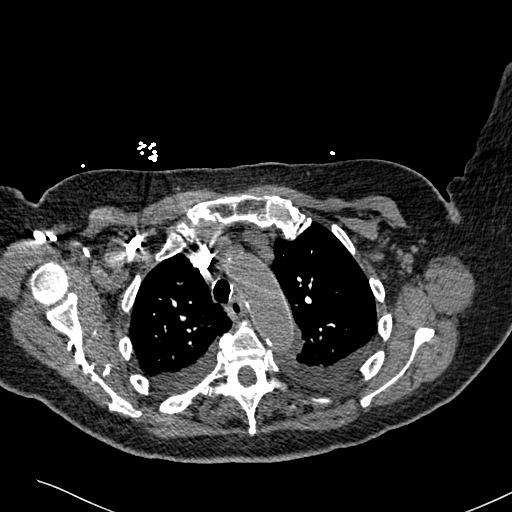
[im 182/215  lung]
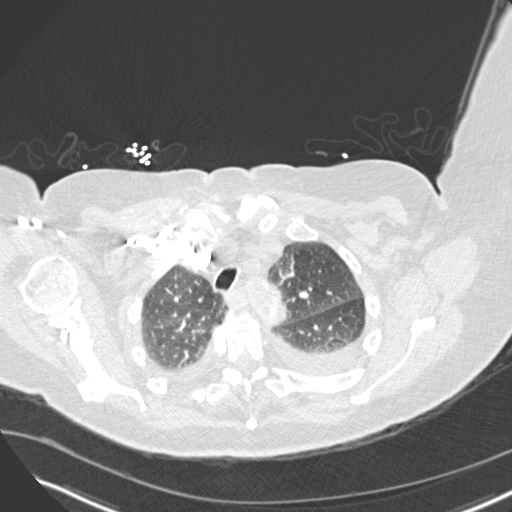
[im 193/215  mediastinal]
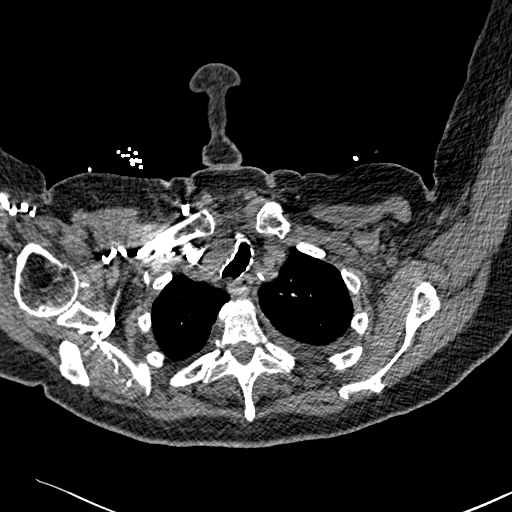
[im 204/215  lung]
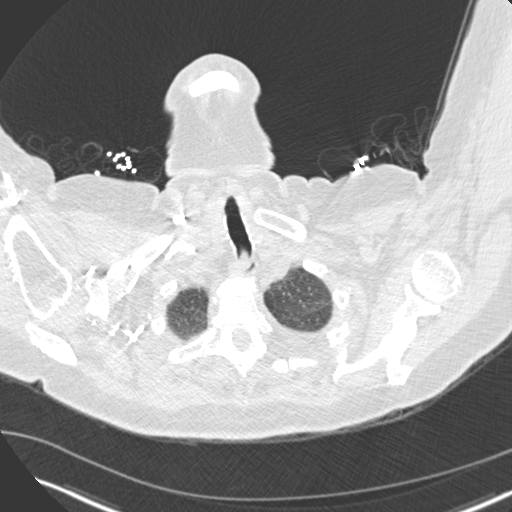

[18 of 36 positions shown; findings below may reference images not displayed]

FINDINGS: Diffusely enlarged heart. Small to moderate-sized bilateral pleural
effusions, larger on the right. Adjacent bilateral lower lobe
atelectasis. Mildly prominent interstitial markings. Mild bullous
changes.

Small number of small to moderate-sized pulmonary arterial filling
defects bilaterally. The right ventricular 2 left ventricular ratio
is difficult to determine due to lack of opacification of the left
ventricle. The ratio is approximately 1.2.

There is a 1.2 cm densely calcified splenic artery aneurysm.
Thoracic spine degenerative changes are noted. No lung nodules or
enlarged lymph nodes.

Review of the MIP images confirms the above findings.
IMPRESSION: 1. Multiple small to moderate-sized bilateral pulmonary emboli.
2. CT evidence of right heart strain (RV/LV Ratio = 1.2) consistent
with at least submassive (intermediate risk) PE. The presence of
right heart strain has been associated with an increased risk of
morbidity and mortality. Please activate Code PE by paging
[phone].
3. Small to moderate-sized bilateral pleural effusions, larger on
the right.
4. Bilateral lower lobe atelectasis.
5. Mild changes of COPD with mild superimposed interstitial
pulmonary edema.
Critical Value/emergent results were called by telephone at the time
of interpretation on 08/19/2014 at [DATE] to Dr. DRHUZEN ESORUMISORU ,
who verbally acknowledged these results.

## 2016-04-28 NOTE — Progress Notes (Signed)
[facility] PREP Weekly Session   Patient Details  Name: KISSY CIELO MRN: [id number] Date of Birth: 1927-04-07 Age: 83 y.o. PCP: Melinda Crutch, MD  Vitals:   04/28/16 1300  Weight: 159 lb (72.1 kg)        [facility] Weekly seesion - 04/28/16 1300      Weekly Session   Topic Discussed Finding support   Minutes exercised this week 140 minutes  flexibility   Classes attended to date 2     Fun things you did since last meeting:"movies, restaurant" Things you are grateful for:"my life & children" Nutrition celebrations:"3 fruits a day" Barriers:"walking"  [name] 04/28/2016, 1:12 PM

## 2016-04-30 ENCOUNTER — Other Ambulatory Visit (HOSPITAL_COMMUNITY): Payer: Self-pay | Admitting: Internal Medicine

## 2016-04-30 NOTE — Progress Notes (Unsigned)
A user error has taken place: {error:315308}.

## 2016-05-03 ENCOUNTER — Ambulatory Visit (INDEPENDENT_AMBULATORY_CARE_PROVIDER_SITE_OTHER): Payer: Medicare HMO | Admitting: *Deleted

## 2016-05-03 DIAGNOSIS — I428 Other cardiomyopathies: Secondary | ICD-10-CM

## 2016-05-03 DIAGNOSIS — I5022 Chronic systolic (congestive) heart failure: Secondary | ICD-10-CM | POA: Diagnosis not present

## 2016-05-03 DIAGNOSIS — Z950 Presence of cardiac pacemaker: Secondary | ICD-10-CM | POA: Diagnosis not present

## 2016-05-03 NOTE — Progress Notes (Signed)
Remote pacemaker transmission.   

## 2016-05-03 NOTE — Progress Notes (Signed)
EPIC Encounter for ICM Monitoring  Patient Name: Alexandra [name] is a 82 y.o. female Date: 05/03/2016 Primary Care Physican: Melinda Crutch, MD Primary Cardiologist:Skains/Bensimhon Electrophysiologist: Faustino Congress Weight:unknown Bi-V Pacing: >99%       Heart Failure questions reviewed, pt asymptomatic.   Thoracic impedance normal.  Prescribed dosage: Furosemide 40 mg 1 tablet every other day.  Potassium 10 mEq 1 tablet daily  LABS: 01/27/2016 Creatinine 1.50, BUN 40, Potassium 3.8, Sodium 141, EGFR 31-36 07/21/2015 Creatinine 1.55, BUN 29, Potassium 3.8, Sodium 139 07/11/2015 Creatinine 1.65, BUN 27, Potassium 4.0, Sodium 138 06/23/2015 Creatinine 1.61, BUN 34, Potassium 4.1, Sodium 139 Creatinine ranged from 1.10 to 1.47 in 2016  Recommendations: No changes. Discussed to limit salt intake to 2000 mg/day and fluid intake to < 2 liters/day.  Encouraged to call for fluid symptoms.  Follow-up plan: ICM clinic phone appointment on 06/03/2016.    Copy of ICM check sent to device physician.   3 month ICM trend: 05/03/2016   1 Year ICM trend:      Alexandra Billings, RN 05/03/2016 1:00 PM

## 2016-05-04 LAB — CUP PACEART REMOTE DEVICE CHECK
Battery Remaining Longevity: 62 mo
Battery Voltage: 2.96 V
Brady Statistic AP VS Percent: 1 %
Brady Statistic AS VS Percent: 1 %
Implantable Lead Implant Date: 20161229
Implantable Lead Location: 753860
Implantable Pulse Generator Implant Date: 20161229
Lead Channel Impedance Value: 400 Ohm
Lead Channel Impedance Value: 800 Ohm
Lead Channel Pacing Threshold Amplitude: 0.75 V
Lead Channel Sensing Intrinsic Amplitude: 12 mV
Lead Channel Sensing Intrinsic Amplitude: 3.1 mV
Lead Channel Setting Pacing Amplitude: 1.75 V
Lead Channel Setting Pacing Amplitude: 2 V
Lead Channel Setting Pacing Amplitude: 3.25 V
Lead Channel Setting Pacing Pulse Width: 0.5 ms
Lead Channel Setting Pacing Pulse Width: 1 ms
MDC IDC LEAD IMPLANT DT: 20161229
MDC IDC LEAD IMPLANT DT: 20161229
MDC IDC LEAD LOCATION: 753858
MDC IDC LEAD LOCATION: 753859
MDC IDC MSMT BATTERY REMAINING PERCENTAGE: 95.5 %
MDC IDC MSMT LEADCHNL LV PACING THRESHOLD AMPLITUDE: 2.5 V
MDC IDC MSMT LEADCHNL LV PACING THRESHOLD PULSEWIDTH: 1 ms
MDC IDC MSMT LEADCHNL RA PACING THRESHOLD PULSEWIDTH: 0.5 ms
MDC IDC MSMT LEADCHNL RV IMPEDANCE VALUE: 550 Ohm
MDC IDC MSMT LEADCHNL RV PACING THRESHOLD AMPLITUDE: 0.875 V
MDC IDC MSMT LEADCHNL RV PACING THRESHOLD PULSEWIDTH: 0.5 ms
MDC IDC PG SERIAL: 7834528
MDC IDC SESS DTM: 20180416060018
MDC IDC SET LEADCHNL RV SENSING SENSITIVITY: 2 mV
MDC IDC STAT BRADY AP VP PERCENT: 21 %
MDC IDC STAT BRADY AS VP PERCENT: 79 %
MDC IDC STAT BRADY RA PERCENT PACED: 20 %
Pulse Gen Model: 3262

## 2016-05-05 ENCOUNTER — Encounter: Payer: Self-pay | Admitting: Cardiology

## 2016-05-05 DIAGNOSIS — R6889 Other general symptoms and signs: Secondary | ICD-10-CM | POA: Diagnosis not present

## 2016-05-07 NOTE — Progress Notes (Signed)
[facility] PREP Weekly Session   Patient Details  Name: Alexandra [name] MRN: [id number] Date of Birth: May 11, 1927 Age: 83 y.o. PCP: Melinda Crutch, MD  Vitals:   05/07/16 1017  Weight: 158 lb (71.7 kg)        [facility] Weekly seesion - 05/07/16 1000      Weekly Session   Topic Discussed Calorie breakdown   Minutes exercised this week --  she didn't document times but placed check marks    Classes attended to date 2     Fun things you did since last meeting:"breakfast, lunch out." Things you are grateful for:"able to go to church.  My family" Nutrition celebration for the week:"ate a lot of fruit" Barriers:"walking"   [name] 05/07/2016, 10:18 AM

## 2016-05-12 DIAGNOSIS — R6889 Other general symptoms and signs: Secondary | ICD-10-CM | POA: Diagnosis not present

## 2016-05-12 NOTE — Progress Notes (Signed)
[facility] PREP Weekly Session   Patient Details  Name: Alexandra [name] MRN: [id number] Date of Birth: 1927/05/01 Age: 83 y.o. PCP: Melinda Crutch, MD  Vitals:   05/12/16 1348  Weight: 158 lb (71.7 kg)        [facility] Weekly seesion - 05/12/16 1300      Weekly Session   Topic Discussed Hitting roadblocks   Minutes exercised this week 170 minutes  50cardio/50strength/1flexibility   Classes attended to date 3     Fun things you did since last meeting:"movies, visiting, restaurant" Things you are grateful for:"my family" Nutrition celebrations:"more vegetables" Barriers:"walking"  [name] 05/12/2016, 1:49 PM

## 2016-05-17 ENCOUNTER — Telehealth: Payer: Self-pay | Admitting: Internal Medicine

## 2016-05-17 NOTE — Telephone Encounter (Signed)
Reviewed bone density with Dr. Caryl Comes- ok to have. I have notified the patient that she may x-rays or a bone density done. She verbalizes understanding.

## 2016-05-17 NOTE — Telephone Encounter (Signed)
New Message  Pt voiced wanting to know if she can have xray or bone density test.  Please f/u

## 2016-05-18 ENCOUNTER — Other Ambulatory Visit: Payer: Self-pay | Admitting: *Deleted

## 2016-05-18 NOTE — Patient Outreach (Addendum)
Case Closure phone call. I called but Alexandra [name] was not at home and I left a message for her to return my call.  Alexandra [name] [facility] [place] [phone]  Alexandra [name] returned my call. She states she is doing well. She is going to the [facility] for exercise. She has used her [employer] transportation benefit to get there. Today we talked about using [employer] versus Uber or Lyft, which I think may be more appropriate for her. She said she used to use this when she was in [place]. I asked if she can get one of her children to add this app to her cell phone.   Pt denies any SOB, CP, edema or weight gain. Her weight is stable and only varies 2#.  I have infomed her that I am closing her case now as she is doing her well with her HF self management. I have told her that I will notify her MD and she will receive a letter from me. I have encouraged her to call me in the future if she does have questions.  Alexandra [name] [facility] [place] [phone]

## 2016-05-19 ENCOUNTER — Encounter: Payer: Self-pay | Admitting: *Deleted

## 2016-05-19 DIAGNOSIS — R6889 Other general symptoms and signs: Secondary | ICD-10-CM | POA: Diagnosis not present

## 2016-05-28 NOTE — Progress Notes (Signed)
[facility] PREP Weekly Session   Patient Details  Name: MARDENE LESSIG MRN: [id number] Date of Birth: [birth year] Age: 83 y.o. PCP: Lawerance Cruel, MD  Vitals:   05/28/16 1001  Weight: 150 lb (68 kg)        [facility] Weekly seesion - 05/28/16 1000      Weekly Session   Topic Discussed Healthy eating tips   Minutes exercised this week 210 minutes  70cardio/70strength/40flexibility   Classes attended to date 5     Fun things you did since last meeting:"church meeting, restaurants" Things you are grateful for:"my family, my church, my friends" Nutrition celebrations for the week:"more fruit" Barriers:"walking"  [name] 05/28/2016, 10:02 AM

## 2016-05-28 NOTE — Telephone Encounter (Signed)
Lets stop digoxin.

## 2016-05-28 NOTE — Progress Notes (Signed)
[facility] PREP Weekly Session   Patient Details  Name: Alexandra [name] MRN: [id number] Date of Birth: 09/06/1927 Age: 83 y.o. PCP: Lawerance Cruel, MD  Vitals:   05/28/16 0857  Weight: 159 lb (72.1 kg)        [facility] Weekly seesion - 05/28/16 0800      Weekly Session   Topic Discussed Other  fat calories   Minutes exercised this week 150 minutes  70cardio/80 strength   Classes attended to date 4     Fun things you did since last meeting:"meetings, restaurant" Things you are grateful for:"family, friends, and our [place]" Nutrition celebrations for the week:"veg, fruit increase" Barriers:"walking"  [name] 05/28/2016, 8:58 AM

## 2016-06-02 NOTE — Progress Notes (Signed)
[facility] PREP Weekly Session   Patient Details  Name: [name] MRN: [id number] Date of Birth: 12-May-1927 Age: 83 y.o. PCP: Lawerance Cruel, MD  Vitals:   06/02/16 1258  Weight: 159 lb (72.1 kg)        [facility] [employer] Weekly seesion - 06/02/16 1200      Weekly Session   Topic Discussed Other ways to be active   Minutes exercised this week 210 minutes  70cardio/70strength/29flexibility   Classes attended to date 6     Fun things you did since last meeting:"went to grandson graduation." Things you are grateful for:"family, friends, church" Nutrition celebrations for the week:"ate more fruit" Barriers:"walking"  [name] 06/02/2016, 12:59 PM

## 2016-06-03 ENCOUNTER — Ambulatory Visit (INDEPENDENT_AMBULATORY_CARE_PROVIDER_SITE_OTHER): Payer: Medicare HMO

## 2016-06-03 DIAGNOSIS — Z950 Presence of cardiac pacemaker: Secondary | ICD-10-CM | POA: Diagnosis not present

## 2016-06-03 DIAGNOSIS — I5022 Chronic systolic (congestive) heart failure: Secondary | ICD-10-CM

## 2016-06-03 NOTE — Progress Notes (Signed)
EPIC Encounter for ICM Monitoring  Patient Name: Alexandra [name] is a 83 y.o. female Date: 06/03/2016 Primary Care Physican: Lawerance Cruel, MD Primary Cardiologist:Skains/Bensimhon Electrophysiologist: Faustino Congress Weight:unknown Bi-V Pacing: >99%         Heart Failure questions reviewed, pt asymptomatic    Thoracic impedance slightly abnormal suggesting fluid accumulation 06/02/2016.  She stated she has eaten too much salt due to some celebrations.   Prescribed dosage: Furosemide 40 mg 1 tablet every other day.  Potassium 10 mEq 1 tablet daily  LABS: 01/27/2016 Creatinine 1.50, BUN 40, Potassium 3.8, Sodium 141, EGFR 31-36 07/21/2015 Creatinine 1.55, BUN 29, Potassium 3.8, Sodium 139 07/11/2015 Creatinine 1.65, BUN 27, Potassium 4.0, Sodium 138 06/23/2015 Creatinine 1.61, BUN 34, Potassium 4.1, Sodium 139 Creatinine ranged from 1.10 to 1.47 in 2016  Recommendations: Advised to limit salt intake to 2000 mg/day and she is getting back on track with the diet.  Encouraged to call for fluid symptoms or use local ER for any urgent symptoms.  Follow-up plan: ICM clinic phone appointment on 07/06/2016.  Copy of ICM check sent to device physician.   3 month ICM trend: 06/03/2016    1 Year ICM trend:      [name], RN 06/03/2016 7:56 AM

## 2016-06-16 ENCOUNTER — Other Ambulatory Visit (HOSPITAL_COMMUNITY): Payer: Self-pay | Admitting: Cardiology

## 2016-06-17 DIAGNOSIS — M8588 Other specified disorders of bone density and structure, other site: Secondary | ICD-10-CM | POA: Diagnosis not present

## 2016-06-18 ENCOUNTER — Other Ambulatory Visit (HOSPITAL_COMMUNITY): Payer: Self-pay | Admitting: Student

## 2016-06-25 DIAGNOSIS — D508 Other iron deficiency anemias: Secondary | ICD-10-CM | POA: Diagnosis not present

## 2016-06-25 DIAGNOSIS — R944 Abnormal results of kidney function studies: Secondary | ICD-10-CM | POA: Diagnosis not present

## 2016-06-25 DIAGNOSIS — F430 Acute stress reaction: Secondary | ICD-10-CM | POA: Diagnosis not present

## 2016-06-25 DIAGNOSIS — I10 Essential (primary) hypertension: Secondary | ICD-10-CM | POA: Diagnosis not present

## 2016-06-25 DIAGNOSIS — E559 Vitamin D deficiency, unspecified: Secondary | ICD-10-CM | POA: Diagnosis not present

## 2016-06-25 DIAGNOSIS — N184 Chronic kidney disease, stage 4 (severe): Secondary | ICD-10-CM | POA: Diagnosis not present

## 2016-06-25 DIAGNOSIS — I509 Heart failure, unspecified: Secondary | ICD-10-CM | POA: Diagnosis not present

## 2016-06-25 DIAGNOSIS — Z0000 Encounter for general adult medical examination without abnormal findings: Secondary | ICD-10-CM | POA: Diagnosis not present

## 2016-06-25 DIAGNOSIS — M8580 Other specified disorders of bone density and structure, unspecified site: Secondary | ICD-10-CM | POA: Diagnosis not present

## 2016-06-25 NOTE — Progress Notes (Signed)
[facility] PREP Weekly Session   Patient Details  Name: Alexandra [name] MRN: [id number] Date of Birth: 01-18-1928 Age: 83 y.o. PCP: Lawerance Cruel, MD  Vitals:   06/23/16 1014  Weight: 159 lb (72.1 kg)        [facility] Weekly seesion - 06/25/16 1000      Weekly Session   Topic Discussed Stress management and problem solving   Minutes exercised this week 230 minutes  90cardio/40strength/152flexibility   Classes attended to date 62     Fun things you did since last meeting:"birthday party lunch, church" Things you are grateful for:"my family and friends" Nutrition celebrations for the week:"drank more water" Barriers:"walking"   Alexandra [name] 06/25/2016, 10:16 AM

## 2016-07-06 ENCOUNTER — Ambulatory Visit (INDEPENDENT_AMBULATORY_CARE_PROVIDER_SITE_OTHER): Payer: Medicare HMO

## 2016-07-06 ENCOUNTER — Telehealth: Payer: Self-pay

## 2016-07-06 DIAGNOSIS — I5022 Chronic systolic (congestive) heart failure: Secondary | ICD-10-CM | POA: Diagnosis not present

## 2016-07-06 DIAGNOSIS — Z950 Presence of cardiac pacemaker: Secondary | ICD-10-CM

## 2016-07-06 NOTE — Telephone Encounter (Signed)
Remote ICM transmission received.  Attempted patient call and left detailed message regarding transmission and next ICM scheduled for 08/09/2016.  Advised to return call for any fluid symptoms or questions.

## 2016-07-06 NOTE — Progress Notes (Signed)
EPIC Encounter for ICM Monitoring  Patient Name: Alexandra [name] is a 83 y.o. female Date: 07/06/2016 Primary Care Physican: Lawerance Cruel, MD Primary Cardiologist:Skains/Bensimhon Electrophysiologist: Faustino Congress Weight:unknown Bi-V Pacing: >99%       Attempted call to patient and unable to reach.  Left detailed message regarding transmission.  Transmission reviewed.    Thoracic impedance is normal (was abnormal 5/26 to 6/6).  Prescribed dosage: Furosemide 40 mg 1 tablet every other day. Potassium 10 mEq 1 tablet daily  LABS: 01/27/2016 Creatinine 1.50, BUN 40, Potassium 3.8, Sodium 141, EGFR 31-36 07/21/2015 Creatinine 1.55, BUN 29, Potassium 3.8, Sodium 139 07/11/2015 Creatinine 1.65, BUN 27, Potassium 4.0, Sodium 138 06/23/2015 Creatinine 1.61, BUN 34, Potassium 4.1, Sodium 139 Creatinine ranged from 1.10 to 1.47 in 2016  Recommendations: Left voice mail with ICM number and encouraged to call for fluid symptoms.  Follow-up plan: ICM clinic phone appointment on 08/09/2016.    Copy of ICM check sent to device physician.   3 month ICM trend: 07/06/2016   1 Year ICM trend:      [name], RN 07/06/2016 10:56 AM

## 2016-07-12 NOTE — Progress Notes (Signed)
[facility] [employer] PREP Weekly Session   Patient Details  Name: Alexandra [name] MRN: [id number] Date of Birth: 1927/01/30 Age: 83 y.o. PCP: Lawerance Cruel, MD  Vitals:   07/12/16 1127  Weight: 157 lb (71.2 kg)        [facility] [employer] Weekly seesion - 07/12/16 1100      Weekly Session   Topic Discussed Importance of resistance training   Minutes exercised this week 200 minutes  100cardio/100strength   Classes attended to date 10     Fun things you did since last meeting:"church luncheons" Things you are grateful for:"my family, friends, & church" Nutrition celebrations for the week:"ate a lot of salads" Barriers:"walk"  [name] 07/12/2016, 11:28 AM

## 2016-07-14 NOTE — Progress Notes (Signed)
[facility] PREP Weekly Session   Patient Details  Name: Alexandra [name] MRN: [id number] Date of Birth: 1927/06/01 Age: 83 y.o. PCP: Lawerance Cruel, MD  Vitals:   07/14/16 1245  Weight: 158 lb (71.7 kg)        [facility] [employer] Weekly seesion - 07/14/16 1200      Weekly Session   Topic Discussed Expectations and non-scale victories   Minutes exercised this week 210 minutes  80cardio/80strength/49flexibility   Classes attended to date 41     Fun things you did since last meeting:"out to lunch" Things you are grateful for:" family, friends, church" Nutrition celebrations for the week:"more fruit" Barriers:"still cannot walk-please help"  [name] 07/14/2016, 12:46 PM

## 2016-07-26 ENCOUNTER — Telehealth (HOSPITAL_COMMUNITY): Payer: Self-pay | Admitting: Pharmacist

## 2016-07-26 NOTE — Telephone Encounter (Signed)
[employer] patient assistance approved for Eliquis 2.5 mg BID free of charge from 07/22/16 through 01/17/17.   [name]. Velva Harman, PharmD, BCPS, CPP Clinical Pharmacist Pager: [phone] Phone: [phone] 07/26/2016 1:09 PM

## 2016-08-02 NOTE — Progress Notes (Signed)
[facility] PREP Weekly Session   Patient Details  Name: Alexandra [name] MRN: [id number] Date of Birth: 05-02-1927 Age: 83 y.o. PCP: Lawerance Cruel, MD  Vitals:   08/02/16 1135  Weight: 156 lb (70.8 kg)        [facility] [employer] Weekly seesion - 08/02/16 1100      Weekly Session   Topic Discussed Other  portion control   Minutes exercised this week 500 minutes  150cardio/150strength/273flexibility   Classes attended to date 79     Fun things you did since last meeting:"party, church, friends" Things you are grateful for:"family, friends, church" Nutrition celebration for the week:"less carbs" Barriers:"Walk"  Alexandra [name] 08/02/2016, 11:36 AM

## 2016-08-03 ENCOUNTER — Other Ambulatory Visit: Payer: Self-pay | Admitting: Nephrology

## 2016-08-03 DIAGNOSIS — I428 Other cardiomyopathies: Secondary | ICD-10-CM | POA: Diagnosis not present

## 2016-08-03 DIAGNOSIS — N184 Chronic kidney disease, stage 4 (severe): Secondary | ICD-10-CM | POA: Diagnosis not present

## 2016-08-03 DIAGNOSIS — Z6829 Body mass index (BMI) 29.0-29.9, adult: Secondary | ICD-10-CM | POA: Diagnosis not present

## 2016-08-03 DIAGNOSIS — K219 Gastro-esophageal reflux disease without esophagitis: Secondary | ICD-10-CM | POA: Diagnosis not present

## 2016-08-06 ENCOUNTER — Other Ambulatory Visit (HOSPITAL_COMMUNITY): Payer: Self-pay | Admitting: Internal Medicine

## 2016-08-09 ENCOUNTER — Ambulatory Visit (INDEPENDENT_AMBULATORY_CARE_PROVIDER_SITE_OTHER): Payer: Medicare HMO | Admitting: *Deleted

## 2016-08-09 DIAGNOSIS — I428 Other cardiomyopathies: Secondary | ICD-10-CM

## 2016-08-09 DIAGNOSIS — I5022 Chronic systolic (congestive) heart failure: Secondary | ICD-10-CM | POA: Diagnosis not present

## 2016-08-09 DIAGNOSIS — Z950 Presence of cardiac pacemaker: Secondary | ICD-10-CM | POA: Diagnosis not present

## 2016-08-10 NOTE — Progress Notes (Signed)
ICD Remote transmission received 

## 2016-08-10 NOTE — Progress Notes (Signed)
EPIC Encounter for ICM Monitoring  Patient Name: Alexandra [name] is a 83 y.o. female Date: 08/10/2016 Primary Care Physican: Lawerance Cruel, MD Primary Cardiologist:Skains/Bensimhon Electrophysiologist: Caryl Comes Dry Weight:157 lbs Bi-V Pacing: >99%                                                         Heart Failure questions reviewed, pt asymptomatic.  She said she is feeling fine.    Thoracic impedance abnormal suggesting fluid accumulation just below baseline.  Patient said she does not follow a low salt diet on the weekends.   Prescribed dosage: Furosemide 40 mg 1 tablet every other day. Potassium 10 mEq 1 tablet daily.  LABS: 01/27/2016 Creatinine 1.50, BUN 40, Potassium 3.8, Sodium 141, EGFR 31-36 07/21/2015 Creatinine 1.55, BUN 29, Potassium 3.8, Sodium 139 07/11/2015 Creatinine 1.65, BUN 27, Potassium 4.0, Sodium 138 06/23/2015 Creatinine 1.61, BUN 34, Potassium 4.1, Sodium 139 Creatinine ranged from 1.10 to 1.47 in 2016  Recommendations: Discussed with Dr Caryl Comes in the office.  Call back to patient and advised Dr Caryl Comes recommended to decrease salt dietary intake and will recheck fluid levels next week.   Follow-up plan: ICM clinic phone appointment on 08/19/2016.  Patient is due make an appointment with Dr Haroldine Laws (last visit 10/13/15) and last office visit with Dr Marlou Porch was 2016.  Copy of ICM check sent to primary cardiologist and device physician.   3 month ICM trend: 08/10/2016    Direct Trend Viewer    1 Year ICM trend:      [name], RN 08/10/2016 10:24 AM

## 2016-08-12 ENCOUNTER — Encounter: Payer: Self-pay | Admitting: Cardiology

## 2016-08-13 ENCOUNTER — Ambulatory Visit
Admission: RE | Admit: 2016-08-13 | Discharge: 2016-08-13 | Disposition: A | Discharge status: Home / Self Care | Payer: Medicare HMO | Source: Ambulatory Visit | Attending: Nephrology | Admitting: Nephrology

## 2016-08-13 DIAGNOSIS — N184 Chronic kidney disease, stage 4 (severe): Secondary | ICD-10-CM

## 2016-08-19 ENCOUNTER — Ambulatory Visit (INDEPENDENT_AMBULATORY_CARE_PROVIDER_SITE_OTHER): Payer: Self-pay

## 2016-08-19 DIAGNOSIS — Z950 Presence of cardiac pacemaker: Secondary | ICD-10-CM

## 2016-08-19 DIAGNOSIS — I5022 Chronic systolic (congestive) heart failure: Secondary | ICD-10-CM

## 2016-08-19 NOTE — Progress Notes (Signed)
EPIC Encounter for ICM Monitoring  Patient Name: Alexandra [name] is a 83 y.o. female Date: 08/19/2016 Primary Care Physican: Ross, Charles Alan, MD Primary Cardiologist:Skains/Bensimhon Electrophysiologist: Klein Dry Weight:157 lbs Bi-V Pacing: >99%      Heart Failure questions reviewed, pt asymptomatic.   Thoracic impedance returned to normal after las ICM transmission on 08/09/2016 but started accumulating again for 2 days.  Prescribed dosage: Furosemide 40 mg 1 tablet every other day. Potassium 10 mEq 1 tablet daily.  LABS: 01/27/2016 Creatinine 1.50, BUN 40, Potassium 3.8, Sodium 141, EGFR 31-36 07/21/2015 Creatinine 1.55, BUN 29, Potassium 3.8, Sodium 139 07/11/2015 Creatinine 1.65, BUN 27, Potassium 4.0, Sodium 138 06/23/2015 Creatinine 1.61, BUN 34, Potassium 4.1, Sodium 139 Creatinine ranged from 1.10 to 1.47 in 2016  Recommendations: Patient does not follow low salt diet.   Reviewed with Dr Klein in the office and no changes today.    Follow-up plan: ICM clinic phone appointment on 09/10/2016.  Patient is due make an appointment with Dr Bensimhon (last visit 10/13/15) and last office visit with Dr Skains was 2016.  Copy of ICM check sent to Dr Bensimhon and Dr Klein.    3 month ICM trend: 08/19/2016   1 Year ICM trend:      Laurie S Short, RN 08/19/2016 11:21 AM    

## 2016-08-23 ENCOUNTER — Telehealth: Payer: Self-pay | Admitting: Internal Medicine

## 2016-08-23 NOTE — Telephone Encounter (Signed)
New message     Please call she has some concerns about her device and would like to speak with Terrence Dupont about it.

## 2016-08-23 NOTE — Telephone Encounter (Signed)
Spoke with pt, pt was wondering if there was something on the transmission that would cause her to be tired, asked pt if this was a new problem or has been ongoing, pt stated that it was new, informed pt that transmission was ok encouraged pt to start with her primary care provider about complaints of tiredness. Pt voiced understanding.

## 2016-09-07 LAB — CUP PACEART REMOTE DEVICE CHECK
Battery Remaining Longevity: 65 mo
Brady Statistic AP VS Percent: 1 %
Brady Statistic AS VP Percent: 79 %
Brady Statistic AS VS Percent: 1 %
Brady Statistic RA Percent Paced: 21 %
Date Time Interrogation Session: 20180724132805
Implantable Lead Implant Date: 20161229
Implantable Lead Location: 753859
Implantable Pulse Generator Implant Date: 20161229
Lead Channel Impedance Value: 440 Ohm
Lead Channel Impedance Value: 890 Ohm
Lead Channel Pacing Threshold Amplitude: 0.75 V
Lead Channel Pacing Threshold Amplitude: 2.25 V
Lead Channel Pacing Threshold Pulse Width: 0.5 ms
Lead Channel Pacing Threshold Pulse Width: 1 ms
Lead Channel Sensing Intrinsic Amplitude: 12 mV
Lead Channel Setting Pacing Amplitude: 1.875
Lead Channel Setting Pacing Pulse Width: 0.5 ms
Lead Channel Setting Sensing Sensitivity: 2 mV
MDC IDC LEAD IMPLANT DT: 20161229
MDC IDC LEAD IMPLANT DT: 20161229
MDC IDC LEAD LOCATION: 753858
MDC IDC LEAD LOCATION: 753860
MDC IDC MSMT BATTERY REMAINING PERCENTAGE: 95.5 %
MDC IDC MSMT BATTERY VOLTAGE: 2.96 V
MDC IDC MSMT LEADCHNL RA PACING THRESHOLD AMPLITUDE: 0.875 V
MDC IDC MSMT LEADCHNL RA PACING THRESHOLD PULSEWIDTH: 0.5 ms
MDC IDC MSMT LEADCHNL RA SENSING INTR AMPL: 3.3 mV
MDC IDC MSMT LEADCHNL RV IMPEDANCE VALUE: 560 Ohm
MDC IDC PG SERIAL: 7834528
MDC IDC SET LEADCHNL LV PACING AMPLITUDE: 3 V
MDC IDC SET LEADCHNL LV PACING PULSEWIDTH: 1 ms
MDC IDC SET LEADCHNL RV PACING AMPLITUDE: 2 V
MDC IDC STAT BRADY AP VP PERCENT: 21 %

## 2016-09-10 ENCOUNTER — Ambulatory Visit: Payer: Medicare HMO

## 2016-09-10 DIAGNOSIS — Z950 Presence of cardiac pacemaker: Secondary | ICD-10-CM

## 2016-09-10 DIAGNOSIS — I5022 Chronic systolic (congestive) heart failure: Secondary | ICD-10-CM

## 2016-09-10 NOTE — Progress Notes (Signed)
EPIC Encounter for ICM Monitoring  Patient Name: Alexandra [name] is a 83 y.o. female Date: 09/10/2016 Primary Care Physican: Lawerance Cruel, MD Primary Cardiologist:Skains/Bensimhon Electrophysiologist: Caryl Comes Dry Weight:Previous ICM [id number] lbs Bi-V Pacing: >99%       Attempted call to patient and unable to reach.  Left detailed message regarding transmission.  Transmission reviewed.    Thoracic impedance normal but was abnormal suggesting fluid accumulation from 08/06/16 to 08/13/16, 08/17/16 to 08/28/16 and 08/30/16 to 09/08/16.  Prescribed dosage: Furosemide 40 mg 1 tablet every other day. Potassium 10 mEq 1 tablet daily.  LABS: 01/27/2016 Creatinine 1.50, BUN 40, Potassium 3.8, Sodium 141, EGFR 31-36 07/21/2015 Creatinine 1.55, BUN 29, Potassium 3.8, Sodium 139 07/11/2015 Creatinine 1.65, BUN 27, Potassium 4.0, Sodium 138 06/23/2015 Creatinine 1.61, BUN 34, Potassium 4.1, Sodium 139  Recommendations: Left voice mail with ICM number and encouraged to call for fluid symptoms.  Follow-up plan: ICM clinic phone appointment on 10/11/2016.    Copy of ICM check sent to Dr. Caryl Comes and Dr Haroldine Laws.   3 month ICM trend: 09/10/2016   1 Year ICM trend:      [name], RN 09/10/2016 7:44 AM

## 2016-09-13 DIAGNOSIS — I428 Other cardiomyopathies: Secondary | ICD-10-CM | POA: Diagnosis not present

## 2016-09-13 DIAGNOSIS — K219 Gastro-esophageal reflux disease without esophagitis: Secondary | ICD-10-CM | POA: Diagnosis not present

## 2016-09-13 DIAGNOSIS — Z6829 Body mass index (BMI) 29.0-29.9, adult: Secondary | ICD-10-CM | POA: Diagnosis not present

## 2016-09-13 DIAGNOSIS — N184 Chronic kidney disease, stage 4 (severe): Secondary | ICD-10-CM | POA: Diagnosis not present

## 2016-10-11 ENCOUNTER — Ambulatory Visit (INDEPENDENT_AMBULATORY_CARE_PROVIDER_SITE_OTHER): Payer: Medicare HMO

## 2016-10-11 DIAGNOSIS — Z950 Presence of cardiac pacemaker: Secondary | ICD-10-CM | POA: Diagnosis not present

## 2016-10-11 DIAGNOSIS — I5022 Chronic systolic (congestive) heart failure: Secondary | ICD-10-CM | POA: Diagnosis not present

## 2016-10-11 NOTE — Progress Notes (Signed)
EPIC Encounter for ICM Monitoring  Patient Name: Alexandra [name] is a 83 y.o. female Date: 10/11/2016 Primary Care Physican: Lawerance Cruel, MD Primary Cardiologist:Skains/Bensimhon Electrophysiologist: Caryl Comes Dry Weight:158 lbs Bi-V Pacing: >99%               Heart Failure questions reviewed, pt asymptomatic.   Thoracic impedance normal.  Prescribed dosage: Furosemide 40 mg 1 tablet every other day. Potassium 10 mEq 1 tablet daily.  LABS: 01/27/2016 Creatinine 1.50, BUN 40, Potassium 3.8, Sodium 141, EGFR 31-36 07/21/2015 Creatinine 1.55, BUN 29, Potassium 3.8, Sodium 139 07/11/2015 Creatinine 1.65, BUN 27, Potassium 4.0, Sodium 138 06/23/2015 Creatinine 1.61, BUN 34, Potassium 4.1, Sodium 139  Recommendations: No changes.  Encouraged to call for fluid symptoms.  Follow-up plan: ICM clinic phone appointment on 11/16/2016.  Office appointment scheduled 11/11/2016 with Dr. Haroldine Laws.  Copy of ICM check sent to Dr. Caryl Comes.   3 month ICM trend: 10/11/2016   1 Year ICM trend:      [name], RN 10/11/2016 9:33 AM

## 2016-11-11 ENCOUNTER — Ambulatory Visit (HOSPITAL_COMMUNITY)
Admission: RE | Admit: 2016-11-11 | Discharge: 2016-11-11 | Disposition: A | Discharge status: Home / Self Care | Payer: Medicare HMO | Source: Ambulatory Visit | Attending: Internal Medicine | Admitting: Internal Medicine

## 2016-11-11 VITALS — BP 141/61 | HR 68 | Wt 157.0 lb

## 2016-11-11 DIAGNOSIS — E669 Obesity, unspecified: Secondary | ICD-10-CM | POA: Insufficient documentation

## 2016-11-11 DIAGNOSIS — N183 Chronic kidney disease, stage 3 unspecified: Secondary | ICD-10-CM

## 2016-11-11 DIAGNOSIS — E039 Hypothyroidism, unspecified: Secondary | ICD-10-CM | POA: Insufficient documentation

## 2016-11-11 DIAGNOSIS — I447 Left bundle-branch block, unspecified: Secondary | ICD-10-CM | POA: Insufficient documentation

## 2016-11-11 DIAGNOSIS — I130 Hypertensive heart and chronic kidney disease with heart failure and stage 1 through stage 4 chronic kidney disease, or unspecified chronic kidney disease: Secondary | ICD-10-CM | POA: Diagnosis not present

## 2016-11-11 DIAGNOSIS — I5022 Chronic systolic (congestive) heart failure: Secondary | ICD-10-CM | POA: Insufficient documentation

## 2016-11-11 DIAGNOSIS — E7800 Pure hypercholesterolemia, unspecified: Secondary | ICD-10-CM | POA: Diagnosis not present

## 2016-11-11 DIAGNOSIS — Z86711 Personal history of pulmonary embolism: Secondary | ICD-10-CM | POA: Diagnosis not present

## 2016-11-11 DIAGNOSIS — Z86718 Personal history of other venous thrombosis and embolism: Secondary | ICD-10-CM | POA: Insufficient documentation

## 2016-11-11 MED ORDER — IVABRADINE HCL 5 MG PO TABS: 2.5000 mg | ORAL_TABLET | Freq: Two times a day (BID) | ORAL | 6 refills | Status: DC

## 2016-11-11 NOTE — Progress Notes (Signed)
Patient ID: Alexandra [name], female   DOB: January 01, 1928, 83 y.o.   MRN: [id number] Patient ID: Alexandra [name], female   DOB: Dec 08, 1927, 83 y.o.   MRN: [id number]     Advanced Heart Failure Clinic Note   Primary Care: Dr. Melinda Crutch Primary Cardiologist: Dr Candee Furbish Primary HF: Dr Haroldine Laws  HPI: Alexandra [name] is a 83 y.o. 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 Discharge weight was 196.  She was admitted in July 2016 for Abdominal pain and SOB. Her Gallbladder was thought to be causing her pain, so a lap choley was performed that admission. Echo on 08/15/14 showed EF 20%. There were thoughts that she may have stress induced CMP due to the death of her husband in 2022/05/24 after several months of hospice care.   She was directly admitted to [facility] on 10/11/14 with concerns for low output with SBPs in 80s and tachycardia.  PICC line was placed with initial co-ox of 61%.CVP was 15. Milrinone started when co-ox dropped to 53%. She had SVT on milrinone 0.25 so started on amio. Developed acute SOB that improved quickly with cessation of amio and extra dose of IV lasix. Felt to possibly have acute amio toxicity. Milrinone decreased to 0.125 and no further PSVT. Sent home on milrinone 0.125. Diuresed well on 80 mg lasix IB BID. Discharge weight was 170.  Admitted 12/1 through 12/6 with PICC line infection. Bcx with [facility] 2/2 with for diptheroids. Bcx 1/2 at [facility]-Bacteremia-cornybacterium . PICC line replaced. She continued on milrinone 0.125 mcg.   S/p CRT-D placement 01/16/15 with Dr Caryl Comes.   Labs 01/29/15  with WBCs trending up 7.9 -> 10 -> 12. BCx ordered with [facility]. On 01/31/15 [facility] with 1/2 BCX + GPC clusters. (I do not have final results from them) Refused admission.  Treated with home vancomycin for 1 week. PICC line not changed.   Echo 2/17 EF ~40%  She returns today for regular follow up. Milrinone stopped in February 2017. Complete [place] program several months ago. Remains very fatigued (little change). Sleeping a lot. Able to go out with friends but not doing much more. No edema, orthopnea or PND. Creatinine up 1.6-1.7. Following with Dr. Lorrene Reid. Hgb and thyroid panels ok.   ICD interrogate personally: NO AF/VT. 100% biv pacing Corevue looks good. Most HRs 60-80  RHC 7/17 RA = 1 RV = 25/2 PA = 29/9 (15) PCW = 6 Fick cardiac output/index = 5.9/3.3 PVR = 1.5 WU Ao sat = 98% PA sat = 68%, 70%, 71%  ECHO 10/02/14 LVEF 15% with diffuse hypokinesis, RV mildly dilated, normal function, PA peak pressure 59 mmHg Echo 2/17 EF 40-45% (I fetlt 35%)   Labs 3/17: Hgb 10.8, K 4.6 Cr 1.38   Past Medical History:  Diagnosis Date  . Adult hypothyroidism 04/02/2014  . Anemia of chronic disease 08/13/2014  . Anxiety   . Arthritis   . Asthma   . breast ca dx'd 2000   left  . CAP (community acquired pneumonia) 12/19/2012  . Chest pain    NM stress test 05/2011 Normal  . CHF (congestive heart failure) ([place]) 08/19/2014  . CKD (chronic kidney disease) stage 3, GFR 30-59 ml/min 08/13/2014  . Depression   . DOE (  dyspnea on exertion)    No SOB at rest  . Essential (primary) hypertension 04/02/2014  . Fatigue    excertional  . DPOEUMPN(361.4)   . Hereditary and idiopathic peripheral neuropathy 07/02/2014  . Hypercholesteremia   . Intermittent LBBB (left bundle branch block) 12/21/2012  . Leg weakness   . Neuropathy ([place])   . Obesity   . Osteopenia   . Peripheral neuropathy ([place])   . Presence of permanent cardiac pacemaker 01/16/2015   BIV  . Sacroiliitis, not elsewhere classified ([place])   . Situational depression   . Trigger finger    [name]    Current Outpatient Prescriptions  Medication Sig Dispense Refill  . acetaminophen (TYLENOL) 500 MG  tablet Take 500 mg by mouth every 6 (six) hours as needed for mild pain or headache. Reported on 06/17/2015    . albuterol (PROVENTIL HFA;VENTOLIN HFA) 108 (90 BASE) MCG/ACT inhaler Inhale 2 puffs into the lungs every 6 (six) hours as needed for wheezing or shortness of breath. Reported on 05/27/2015    . ALPRAZolam (XANAX) 0.25 MG tablet Take 0.25 mg by mouth 3 (three) times daily as needed for anxiety. Reported on 05/27/2015    . amoxicillin (AMOXIL) 500 MG capsule Take 4 capsules by mouth as needed 1 hour before dental work 4 capsule prn  . apixaban (ELIQUIS) 2.5 MG TABS tablet Take 1 tablet (2.5 mg total) by mouth 2 (two) times daily. 60 tablet 11  . b complex vitamins tablet Take 1 tablet by mouth daily with lunch.     . calcium carbonate (TUMS - DOSED IN MG ELEMENTAL CALCIUM) 500 MG chewable tablet Chew 2 tablets by mouth daily as needed for indigestion or heartburn. Reported on 07/18/2015    . Calcium Carbonate-Vitamin D (CALTRATE 600+D PO) Take 1 tablet by mouth daily.     . cholecalciferol (VITAMIN D) 1000 UNITS tablet Take 1,000 Units by mouth daily with lunch.     . citalopram (CELEXA) 20 MG tablet TAKE 1 TABLET(20 MG) BY MOUTH DAILY 30 tablet 0  . CORLANOR 5 MG TABS tablet TAKE 1 TABLET(5 MG) BY MOUTH TWICE DAILY WITH A MEAL 60 tablet 6  . digoxin (LANOXIN) 0.125 MG tablet Take 0.0625 mg by mouth daily.    . famotidine (PEPCID) 20 MG tablet Take 20 mg by mouth 2 (two) times daily.    . ferrous sulfate 325 (65 FE) MG tablet Take 325 mg by mouth daily with breakfast.    . furosemide (LASIX) 40 MG tablet Take one tablet (40 mg) by mouth once every other day    . hydrALAZINE (APRESOLINE) 25 MG tablet TAKE 1 TABLET BY MOUTH THREE TIMES DAILY 270 tablet 3  . lactose free nutrition (BOOST) LIQD Take 237 mLs by mouth daily with breakfast.     . levothyroxine (SYNTHROID, LEVOTHROID) 25 MCG tablet Take 25 mcg by mouth daily before breakfast. for thyroid    . MAGNESIUM-OXIDE 400 (241.3 Mg) MG tablet  TAKE 1/2 TABLET(200 MG) BY MOUTH DAILY 15 tablet 11  . Multiple Vitamin (MULITIVITAMIN WITH MINERALS) TABS Take 1 tablet by mouth daily with lunch.     . nitroGLYCERIN (NITROSTAT) 0.4 MG SL tablet Place 1 tablet (0.4 mg total) under the tongue every 5 (five) minutes as needed for chest pain. 25 tablet 4  . polycarbophil (FIBERCON) 625 MG tablet Take 625 mg by mouth daily.    Vladimir Faster Glycol-Propyl Glycol (SYSTANE OP) Place 1 drop into both eyes 2 (two) times daily.    Marland Kitchen  potassium chloride (K-DUR) 10 MEQ tablet TAKE 1 TABLET BY MOUTH DAILY 30 tablet 3  . spironolactone (ALDACTONE) 25 MG tablet TAKE 1 TABLET(25 MG) BY MOUTH DAILY 90 tablet 3   No current facility-administered medications for this encounter.     Allergies  Allergen Reactions  . Amiodarone Shortness Of Breath and Nausea And Vomiting  . Cymbalta [Duloxetine Hcl] Other (See Comments)    Depression  . Lyrica [Pregabalin] Other (See Comments)    Blurry vision      Social History   Social History  . Marital status: Married    Spouse name: N/A  . Number of children: 5  . Years of education: HS   Occupational History  . Retired    Social History Main Topics  . Smoking status: Never Smoker  . Smokeless tobacco: Never Used  . Alcohol use No  . Drug use: No  . Sexual activity: No   Other Topics Concern  . Not on file   Social History Narrative   Lives at home alone.   Right-handed.   Two cups caffeine daily (coffee).      Family History  Problem Relation Age of Onset  . Pneumonia Mother 45       cause of death  . Diabetes Brother   . Pancreatic cancer Father 29  . Alzheimer's disease Sister   . CVA Daughter        01/21/09  . Stroke Daughter   . Hypertension Daughter   . Heart attack Neg Hx     Vitals:   11/11/16 1158  BP: (!) 141/61  Pulse: 68  SpO2: 96%  Weight: 157 lb (71.2 kg)   Wt Readings from Last 3 Encounters:  11/11/16 157 lb (71.2 kg)  08/02/16 156 lb (70.8 kg)  07/14/16 158 lb  (71.7 kg)    PHYSICAL EXAM: General:  Well appearing. No resp difficulty HEENT: normal Neck: supple. no JVD. Carotids 2+ bilat; no bruits. No lymphadenopathy or thryomegaly appreciated. Cor: PMI nondisplaced. Regular rate & rhythm. No rubs, gallops or murmurs. Lungs: clear Abdomen: soft, nontender, nondistended. No hepatosplenomegaly. No bruits or masses. Good bowel sounds. Extremities: no cyanosis, clubbing, rash, edema Neuro: alert & orientedx3, cranial nerves grossly intact. moves all 4 extremities w/o difficulty. Affect pleasant   ASSESSMENT & PLAN:  1. Chronic Systolic Heart failure - Echo 10/02/14 EF 15% Echo 2/17 read as 40-45% I reviewed probably closer to 35%. Off milrinone. s/p St Jude CRT-D placement. 01/16/15 - She continues with NYHA III fatigue though RHC and activity level on ICD do not support. - Volume status looks good. - ICD interrogated personally in clinic - Continue hydralazine and spiro. Previously failed bblocker. No ACE due to CKD,.  - I wonder if fatigue related to reltive bradycardia.  Stop digoxin.. Cut Corlanor to 2.5 bid - Continue lasix 40 mg daily + 10 meq potassium daily .   - Repeat echo 2. H/o PE/ DVT bilateral by [place] 08/20/2014 - Continue apixaban 2.5 bid for long-term prevention of DVT per Amplify EXT trial 3. CKD Stage 3  - Stable on recent labs via [facility].  4. Hx of LBBB -s/p CRT-D 5. Hypertension  - Stable. 6. Intolerance of amiodarone due to possible acute lung toxicity  Bensimhon, Daniel,MD 12:09 PM

## 2016-11-11 NOTE — Patient Instructions (Signed)
Stop Digoxin  Decrease Corlanor to 2.5 mg (1/2 tab) Twice daily   Your physician has requested that you have an echocardiogram. Echocardiography is a painless test that uses sound waves to create images of your heart. It provides your doctor with information about the size and shape of your heart and how well your heart's chambers and valves are working. This procedure takes approximately one hour. There are no restrictions for this procedure.  We will contact you in 6 months to schedule your next appointment.

## 2016-11-16 ENCOUNTER — Ambulatory Visit (INDEPENDENT_AMBULATORY_CARE_PROVIDER_SITE_OTHER): Payer: Medicare HMO

## 2016-11-16 ENCOUNTER — Telehealth: Payer: Self-pay

## 2016-11-16 ENCOUNTER — Ambulatory Visit (INDEPENDENT_AMBULATORY_CARE_PROVIDER_SITE_OTHER): Payer: Medicare HMO | Admitting: *Deleted

## 2016-11-16 DIAGNOSIS — I5022 Chronic systolic (congestive) heart failure: Secondary | ICD-10-CM | POA: Diagnosis not present

## 2016-11-16 DIAGNOSIS — Z950 Presence of cardiac pacemaker: Secondary | ICD-10-CM

## 2016-11-16 DIAGNOSIS — I428 Other cardiomyopathies: Secondary | ICD-10-CM | POA: Diagnosis not present

## 2016-11-16 NOTE — Progress Notes (Signed)
EPIC Encounter for ICM Monitoring  Patient Name: Alexandra [name] is a 83 y.o. female Date: 11/16/2016 Primary Care Physican: Lawerance Cruel, MD Primary Cardiologist:Skains/Bensimhon Electrophysiologist: Caryl Comes Dry Weight:Last weight 158 lbs Bi-V Pacing: >99%      Attempted call to patient and unable to reach.  Left detailed message regarding transmission.  Transmission reviewed.    Thoracic impedance normal.  Prescribed dosage: Furosemide 40 mg 1 tablet every other day. Potassium 10 mEq 1 tablet daily.  LABS: 01/27/2016 Creatinine 1.50, BUN 40, Potassium 3.8, Sodium 141, EGFR 31-36 07/21/2015 Creatinine 1.55, BUN 29, Potassium 3.8, Sodium 139 07/11/2015 Creatinine 1.65, BUN 27, Potassium 4.0, Sodium 138 06/23/2015 Creatinine 1.61, BUN 34, Potassium 4.1, Sodium 139  Recommendations: Left voice mail with ICM number and encouraged to call if experiencing any fluid symptoms.  Follow-up plan: ICM clinic phone appointment on 12/17/2016.    Copy of ICM check sent to Dr. Caryl Comes.   3 month ICM trend: 11/16/2016   1 Year ICM trend:      [name], RN 11/16/2016 2:11 PM

## 2016-11-16 NOTE — Progress Notes (Signed)
Remote ICD transmission.   

## 2016-11-16 NOTE — Telephone Encounter (Signed)
Remote ICM transmission received.  Attempted call to patient and left detailed message per DPR regarding transmission and next ICM scheduled for 12/17/2016.  Advised to return call for any fluid symptoms or questions.

## 2016-11-24 ENCOUNTER — Encounter: Payer: Self-pay | Admitting: Cardiology

## 2016-11-29 ENCOUNTER — Other Ambulatory Visit: Payer: Self-pay | Admitting: Internal Medicine

## 2016-11-29 DIAGNOSIS — Z23 Encounter for immunization: Secondary | ICD-10-CM | POA: Diagnosis not present

## 2016-11-29 DIAGNOSIS — N184 Chronic kidney disease, stage 4 (severe): Secondary | ICD-10-CM | POA: Diagnosis not present

## 2016-11-29 DIAGNOSIS — I428 Other cardiomyopathies: Secondary | ICD-10-CM | POA: Diagnosis not present

## 2016-11-29 DIAGNOSIS — K219 Gastro-esophageal reflux disease without esophagitis: Secondary | ICD-10-CM | POA: Diagnosis not present

## 2016-11-29 DIAGNOSIS — Z6829 Body mass index (BMI) 29.0-29.9, adult: Secondary | ICD-10-CM | POA: Diagnosis not present

## 2016-12-06 ENCOUNTER — Other Ambulatory Visit: Payer: Self-pay

## 2016-12-06 ENCOUNTER — Ambulatory Visit (HOSPITAL_COMMUNITY): Payer: Medicare HMO | Attending: Cardiology

## 2016-12-06 DIAGNOSIS — I5030 Unspecified diastolic (congestive) heart failure: Secondary | ICD-10-CM | POA: Diagnosis not present

## 2016-12-06 DIAGNOSIS — I061 Rheumatic aortic insufficiency: Secondary | ICD-10-CM | POA: Diagnosis not present

## 2016-12-06 DIAGNOSIS — I5022 Chronic systolic (congestive) heart failure: Secondary | ICD-10-CM | POA: Diagnosis not present

## 2016-12-07 LAB — CUP PACEART REMOTE DEVICE CHECK
Battery Remaining Longevity: 64 mo
Battery Remaining Percentage: 95.5 %
Battery Voltage: 2.96 V
Brady Statistic AP VS Percent: 1 %
Brady Statistic RA Percent Paced: 22 %
Implantable Lead Implant Date: 20161229
Implantable Lead Location: 753859
Implantable Pulse Generator Implant Date: 20161229
Lead Channel Impedance Value: 440 Ohm
Lead Channel Impedance Value: 860 Ohm
Lead Channel Pacing Threshold Amplitude: 0.75 V
Lead Channel Pacing Threshold Pulse Width: 0.5 ms
Lead Channel Sensing Intrinsic Amplitude: 12 mV
Lead Channel Sensing Intrinsic Amplitude: 3.9 mV
Lead Channel Setting Pacing Amplitude: 2 V
Lead Channel Setting Pacing Amplitude: 3.125
Lead Channel Setting Pacing Pulse Width: 0.5 ms
Lead Channel Setting Pacing Pulse Width: 1 ms
MDC IDC LEAD IMPLANT DT: 20161229
MDC IDC LEAD IMPLANT DT: 20161229
MDC IDC LEAD LOCATION: 753858
MDC IDC LEAD LOCATION: 753860
MDC IDC MSMT LEADCHNL LV PACING THRESHOLD AMPLITUDE: 2.375 V
MDC IDC MSMT LEADCHNL LV PACING THRESHOLD PULSEWIDTH: 1 ms
MDC IDC MSMT LEADCHNL RV IMPEDANCE VALUE: 560 Ohm
MDC IDC MSMT LEADCHNL RV PACING THRESHOLD AMPLITUDE: 0.75 V
MDC IDC MSMT LEADCHNL RV PACING THRESHOLD PULSEWIDTH: 0.5 ms
MDC IDC PG SERIAL: 7834528
MDC IDC SESS DTM: 20181030061027
MDC IDC SET LEADCHNL RA PACING AMPLITUDE: 1.75 V
MDC IDC SET LEADCHNL RV SENSING SENSITIVITY: 2 mV
MDC IDC STAT BRADY AP VP PERCENT: 22 %
MDC IDC STAT BRADY AS VP PERCENT: 77 %
MDC IDC STAT BRADY AS VS PERCENT: 1 %

## 2016-12-17 ENCOUNTER — Telehealth: Payer: Self-pay

## 2016-12-17 ENCOUNTER — Ambulatory Visit (INDEPENDENT_AMBULATORY_CARE_PROVIDER_SITE_OTHER): Payer: Medicare HMO

## 2016-12-17 DIAGNOSIS — Z950 Presence of cardiac pacemaker: Secondary | ICD-10-CM | POA: Diagnosis not present

## 2016-12-17 DIAGNOSIS — I5022 Chronic systolic (congestive) heart failure: Secondary | ICD-10-CM

## 2016-12-17 NOTE — Telephone Encounter (Signed)
Remote ICM transmission received.  Attempted call to patient and left message to return call. 

## 2016-12-17 NOTE — Progress Notes (Signed)
EPIC Encounter for ICM Monitoring  Patient Name: Alexandra [name] is a 83 y.o. female Date: 12/17/2016 Primary Care Physican: Lawerance Cruel, MD Primary Cardiologist:Skains/Bensimhon Electrophysiologist: Caryl Comes Dry Weight:Last weight 158lbs Bi-V Pacing: >99%      Attempted call to patient and unable to reach.  Left message to return call.  Transmission reviewed.    Thoracic impedance normal but was abnormal suggesting fluid accumulation from 11-29-2016 to 12-14-2016.  Prescribed dosage: Furosemide 40 mg 1 tablet every other day. Potassium 10 mEq 1 tablet daily.  LABS: 01/27/2016 Creatinine 1.50, BUN 40, Potassium 3.8, Sodium 141, EGFR 31-36 07/21/2015 Creatinine 1.55, BUN 29, Potassium 3.8, Sodium 139 07/11/2015 Creatinine 1.65, BUN 27, Potassium 4.0, Sodium 138 06/23/2015 Creatinine 1.61, BUN 34, Potassium 4.1, Sodium 139  Recommendations: NONE - Unable to reach.s.  Follow-up plan: ICM clinic phone appointment on 01/17/2017.  Office appointment scheduled 01/26/2017 with Dr. Caryl Comes.  Copy of ICM check sent to Dr. Caryl Comes.   3 month ICM trend: 12/17/2016    1 Year ICM trend:      [name], RN 12/17/2016 7:56 AM

## 2016-12-20 ENCOUNTER — Telehealth (HOSPITAL_COMMUNITY): Payer: Self-pay | Admitting: *Deleted

## 2016-12-20 ENCOUNTER — Telehealth: Payer: Self-pay | Admitting: Internal Medicine

## 2016-12-20 NOTE — Telephone Encounter (Signed)
Returned patient call.

## 2016-12-20 NOTE — Telephone Encounter (Signed)
Patient states that she is returning call to [facility]

## 2016-12-20 NOTE — Progress Notes (Addendum)
Patient returned call.  She stated she was out of town during decreased impedance but did not have any fluid symptoms.  She said weight is stable at 158 lbs and feeling fine.  Advised to be sure resumes low salt diet since she has returned home.   No changes today and next ICM remote transmission scheduled for 01/17/2017.

## 2016-12-20 NOTE — Telephone Encounter (Signed)
Results of ECHO given to patient.  Balinda Quails RN, VAD Coordinator 24/7 pager [phone]

## 2017-01-15 ENCOUNTER — Other Ambulatory Visit (HOSPITAL_COMMUNITY): Payer: Self-pay | Admitting: Internal Medicine

## 2017-01-21 ENCOUNTER — Ambulatory Visit (INDEPENDENT_AMBULATORY_CARE_PROVIDER_SITE_OTHER): Payer: Medicare HMO

## 2017-01-21 DIAGNOSIS — I5022 Chronic systolic (congestive) heart failure: Secondary | ICD-10-CM

## 2017-01-21 DIAGNOSIS — Z950 Presence of cardiac pacemaker: Secondary | ICD-10-CM

## 2017-01-21 NOTE — Progress Notes (Signed)
EPIC Encounter for ICM Monitoring  Patient Name: Alexandra [name] is a 84 y.o. female Date: 01/21/2017 Primary Care Physican: Lawerance Cruel, MD Primary Cardiologist:Skains/Bensimhon Electrophysiologist: Faustino Congress Weight:Last [id number] Bi-V Pacing: >99%      Attempted call to patient and unable to reach.   Transmission reviewed.    Thoracic impedance abnormal suggesting fluid accumulation since 01/03/2017 (18 days).  Prescribed dosage: Furosemide 40 mg 1 tablet every other day. Potassium 10 mEq 1 tablet daily.  LABS: 01/27/2016 Creatinine 1.50, BUN 40, Potassium 3.8, Sodium 141, EGFR 31-36 07/21/2015 Creatinine 1.55, BUN 29, Potassium 3.8, Sodium 139 07/11/2015 Creatinine 1.65, BUN 27, Potassium 4.0, Sodium 138 06/23/2015 Creatinine 1.61, BUN 34, Potassium 4.1, Sodium 139  Recommendations: NONE - Unable to reach.  Follow-up plan: ICM clinic phone appointment on 02/10/2017 to recheck fluid levels.  Office appointment scheduled 01/26/2017 with Dr. Caryl Comes.  Copy of ICM check sent to Dr. Caryl Comes and Dr. Haroldine Laws.   3 month ICM trend: 01/21/2017    1 Year ICM trend:       [name], RN 01/21/2017 7:42 AM

## 2017-01-26 ENCOUNTER — Encounter: Payer: Self-pay | Admitting: Internal Medicine

## 2017-01-26 ENCOUNTER — Ambulatory Visit: Payer: Medicare HMO | Admitting: Internal Medicine

## 2017-01-26 ENCOUNTER — Encounter (INDEPENDENT_AMBULATORY_CARE_PROVIDER_SITE_OTHER): Payer: Self-pay

## 2017-01-26 VITALS — BP 138/68 | HR 82 | Ht 64.5 in | Wt 158.2 lb

## 2017-01-26 DIAGNOSIS — I471 Supraventricular tachycardia: Secondary | ICD-10-CM | POA: Diagnosis not present

## 2017-01-26 DIAGNOSIS — I5022 Chronic systolic (congestive) heart failure: Secondary | ICD-10-CM | POA: Diagnosis not present

## 2017-01-26 DIAGNOSIS — Z950 Presence of cardiac pacemaker: Secondary | ICD-10-CM | POA: Diagnosis not present

## 2017-01-26 DIAGNOSIS — I447 Left bundle-branch block, unspecified: Secondary | ICD-10-CM | POA: Diagnosis not present

## 2017-01-26 LAB — CUP PACEART INCLINIC DEVICE CHECK
Battery Voltage: 2.96 V
Brady Statistic RV Percent Paced: 99.72 %
Date Time Interrogation Session: 20190109165109
Implantable Lead Implant Date: 20161229
Implantable Lead Location: 753860
Implantable Pulse Generator Implant Date: 20161229
Lead Channel Impedance Value: 425 Ohm
Lead Channel Impedance Value: 800 Ohm
Lead Channel Pacing Threshold Amplitude: 0.625 V
Lead Channel Pacing Threshold Amplitude: 1.875 V
Lead Channel Pacing Threshold Pulse Width: 0.5 ms
Lead Channel Sensing Intrinsic Amplitude: 12 mV
Lead Channel Setting Pacing Amplitude: 2 V
Lead Channel Setting Pacing Amplitude: 2.625
Lead Channel Setting Pacing Pulse Width: 0.5 ms
Lead Channel Setting Sensing Sensitivity: 2 mV
MDC IDC LEAD IMPLANT DT: 20161229
MDC IDC LEAD IMPLANT DT: 20161229
MDC IDC LEAD LOCATION: 753858
MDC IDC LEAD LOCATION: 753859
MDC IDC MSMT BATTERY REMAINING LONGEVITY: 60 mo
MDC IDC MSMT LEADCHNL LV PACING THRESHOLD PULSEWIDTH: 1 ms
MDC IDC MSMT LEADCHNL RA PACING THRESHOLD AMPLITUDE: 0.875 V
MDC IDC MSMT LEADCHNL RA SENSING INTR AMPL: 3.6 mV
MDC IDC MSMT LEADCHNL RV IMPEDANCE VALUE: 525 Ohm
MDC IDC MSMT LEADCHNL RV PACING THRESHOLD PULSEWIDTH: 0.5 ms
MDC IDC SET LEADCHNL LV PACING PULSEWIDTH: 1 ms
MDC IDC SET LEADCHNL RA PACING AMPLITUDE: 1.875
MDC IDC STAT BRADY RA PERCENT PACED: 20 %
Pulse Gen Model: 3262
Pulse Gen Serial Number: 7834528

## 2017-01-26 NOTE — Progress Notes (Signed)
Patient Care Team: Lawerance Cruel, MD as PCP - General (Family Medicine) Deboraha Sprang, MD as Consulting Physician (Cardiology)   HPI  Alexandra [name] is a 84 y.o. female Seen following  CRT  implant  She has a history of nonischemic cardiomyopathy and left bundle branch block. It was initially thought to be related to [place] when she presented 7/16 EF at that time was 20%. She was admitted 9/16 with low output and was treated with milrinone,  down titration of which was required because of tachycardia. 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  She required outpatient milrinone area and this was ultimately stopped 2/17. She underwent right heart catheterization 7/17 with outstanding numbers. Low filling pressures.   She originally did not respond significantly to  CRT. We reprogrammed her AV delays and underwent AV optimization by echo  DATE TEST    9/16 Echo    EF 15 %   2/17 Echo    EF 40-45 %   11/18 Echo  EF 45-50%       Date Cr K Hgb  1/18 1.5 3.8 12.2  11/18 1.46 4.4    Still with fatigue although not with dyspnea on exertion, no edema.  No chest pain   Corlanor stopped--and she may feel some better    Records and Results Reviewed HF clinic notes and labs from renal     Past Medical History:  Diagnosis Date  . Adult hypothyroidism 04/02/2014  . Anemia of chronic disease 08/13/2014  . Anxiety   . Arthritis   . Asthma   . breast ca dx'd 2000   left  . CAP (community acquired pneumonia) 12/19/2012  . Chest pain    NM stress test 05/2011 Normal  . CHF (congestive heart failure) ([place]) 08/19/2014  . CKD (chronic kidney disease) stage 3, GFR 30-59 ml/min ([facility]) 08/13/2014  . Depression   . DOE (dyspnea on exertion)    No SOB at rest  . Essential (primary) hypertension 04/02/2014  . Fatigue    excertional  . AJOINOMV(672.0)   . Hereditary and idiopathic  peripheral neuropathy 07/02/2014  . Hypercholesteremia   . Intermittent LBBB (left bundle branch block) 12/21/2012  . Leg weakness   . Neuropathy   . Obesity   . Osteopenia   . Peripheral neuropathy   . Presence of permanent cardiac pacemaker 01/16/2015   BIV  . Sacroiliitis, not elsewhere classified ([place])   . Situational depression   . Trigger finger    [name]    Past Surgical History:  Procedure Laterality Date  . ACHILLES TENDON SURGERY    . BREAST LUMPECTOMY     breast cancer  . CARDIAC CATHETERIZATION N/A 07/21/2015   Procedure: Right Heart Cath;  Surgeon: Jolaine Artist, MD;  Location: [facility];  Service: Cardiovascular;  Laterality: N/A;  . CHOLECYSTECTOMY N/A 08/13/2014   Procedure: LAPAROSCOPIC CHOLECYSTECTOMY;  Surgeon: Stark Eustacio Ellen, MD;  Location: [facility];  Service: General;  Laterality: N/A;  . EP IMPLANTABLE DEVICE N/A 01/16/2015   Procedure: BiV Pacemaker Insertion CRT-P;  Surgeon: Deboraha Sprang, MD;  Location: [facility];  Service: Cardiovascular;  Laterality: N/A;  . LEG SURGERY    . LUMBAR LAMINECTOMY    . POLYPECTOMY    . TONSILLECTOMY    . VESICOVAGINAL FISTULA CLOSURE W/ TAH     DC hysterectomy    Current  Outpatient Medications  Medication Sig Dispense Refill  . acetaminophen (TYLENOL) 500 MG tablet Take 500 mg by mouth every 6 (six) hours as needed for mild pain or headache. Reported on 06/17/2015    . albuterol (PROVENTIL HFA;VENTOLIN HFA) 108 (90 BASE) MCG/ACT inhaler Inhale 2 puffs into the lungs every 6 (six) hours as needed for wheezing or shortness of breath. Reported on 05/27/2015    . ALPRAZolam (XANAX) 0.25 MG tablet Take 0.25 mg by mouth 3 (three) times daily as needed for anxiety. Reported on 05/27/2015    . amoxicillin (AMOXIL) 500 MG capsule Take 4 capsules by mouth as needed 1 hour before dental work 4 capsule prn  . apixaban (ELIQUIS) 2.5 MG TABS tablet Take 1 tablet (2.5 mg total) by mouth 2 (two) times daily. 60 tablet 11   . b complex vitamins tablet Take 1 tablet by mouth daily with lunch.     . calcium carbonate (TUMS - DOSED IN MG ELEMENTAL CALCIUM) 500 MG chewable tablet Chew 2 tablets by mouth daily as needed for indigestion or heartburn. Reported on 07/18/2015    . Calcium Carbonate-Vitamin D (CALTRATE 600+D PO) Take 1 tablet by mouth daily.     . cholecalciferol (VITAMIN D) 1000 UNITS tablet Take 1,000 Units by mouth daily with lunch.     . citalopram (CELEXA) 20 MG tablet TAKE 1 TABLET(20 MG) BY MOUTH DAILY 30 tablet 0  . famotidine (PEPCID) 20 MG tablet Take 20 mg by mouth daily.    . ferrous sulfate 325 (65 FE) MG tablet Take 325 mg by mouth daily with breakfast.    . furosemide (LASIX) 40 MG tablet Take one tablet (40 mg) by mouth once every other day    . hydrALAZINE (APRESOLINE) 25 MG tablet TAKE 1 TABLET BY MOUTH THREE TIMES DAILY 270 tablet 3  . ivabradine (CORLANOR) 5 MG TABS tablet Take 0.5 tablets (2.5 mg total) by mouth 2 (two) times daily with a meal. 30 tablet 6  . lactose free nutrition (BOOST) LIQD Take 237 mLs by mouth daily with breakfast.     . levothyroxine (SYNTHROID, LEVOTHROID) 25 MCG tablet Take 25 mcg by mouth daily before breakfast. for thyroid    . MAGNESIUM-OXIDE 400 (241.3 Mg) MG tablet TAKE 1/2 TABLET(200 MG) BY MOUTH DAILY 15 tablet 11  . Multiple Vitamin (MULITIVITAMIN WITH MINERALS) TABS Take 1 tablet by mouth daily with lunch.     . nitroGLYCERIN (NITROSTAT) 0.4 MG SL tablet Place 1 tablet (0.4 mg total) under the tongue every 5 (five) minutes as needed for chest pain. 25 tablet 4  . polycarbophil (FIBERCON) 625 MG tablet Take 625 mg by mouth daily.    Vladimir Faster Glycol-Propyl Glycol (SYSTANE OP) Place 1 drop into both eyes 2 (two) times daily.    . potassium chloride (K-DUR) 10 MEQ tablet TAKE 1 TABLET BY MOUTH DAILY 30 tablet 2  . spironolactone (ALDACTONE) 25 MG tablet TAKE 1 TABLET(25 MG) BY MOUTH DAILY 90 tablet 3   No current facility-administered medications for  this visit.     Allergies  Allergen Reactions  . Amiodarone Shortness Of Breath and Nausea And Vomiting  . Cymbalta [Duloxetine Hcl] Other (See Comments)    Depression  . Lyrica [Pregabalin] Other (See Comments)    Blurry vision      Review of Systems negative except from HPI and PMH  Physical Exam BP 138/68   Pulse 82   Ht 5' 4.5" (1.638 m)   Wt 158 lb 3.2  oz (71.8 kg)   SpO2 98%   BMI 26.74 kg/m  Well developed and nourished in no acute distress HENT normal Device pocket well healed; without hematoma or erythema.  There is no tethering Neck supple with JVP-flat Clear Regular rate and rhythm, no murmurs or gallops Abd-soft with active BS No Clubbing cyanosis edema Skin-warm and dry A & Oriented  Grossly normal sensory and motor function   ECG is P synchronous pacing with a QRS negative in lead V1 positive lead I but a QRS duration of only 128 msec  (LBBB preop 150 Personally reviewed)  Assessment and  Plan Nonischemic cardiomyopathy  Congestive heart failure-chronic-systolic  ICD-CRT-D-St. Jude's  Fatigue  Renal dysfunction gd 3/4  Atrial Tachycardia  High Risk Medication Surveillance  Her aldactone is approaching its limit of GFR ( just at 32)  Euvolemic continue current meds  Her EF is stable  Atrial tach is minimal  Her sinus rates are much attenuated, and now she mght benefit from discontinuation of corlanor  If HR remains blunted, we can activate rate response.    ECG duration is suprisingly narrow as noted above.  I think there is likely little to gain from futther efforts to reprogram

## 2017-01-31 ENCOUNTER — Telehealth: Payer: Self-pay | Admitting: Internal Medicine

## 2017-01-31 NOTE — Telephone Encounter (Signed)
New Message    Patient was following up with Dr Caryl Comes about calibrating her pacemaker , he was suppose to speak with Dr Jeffie Pollock

## 2017-02-01 NOTE — Telephone Encounter (Signed)
I informed her that based off of Dr.Klein's last ov note he was going to speak to Dr.DB about d/c'ing the corlanor and activating RR if HR's were still blunted.    Informed patient that Dr.Klein will be out of the office for the next 2 weeks. I told her that I would send him a message and we would contact her as soon as we receive recommendations..  Patient verbalized understanding.

## 2017-02-09 NOTE — Telephone Encounter (Signed)
Ok by me

## 2017-02-09 NOTE — Telephone Encounter (Signed)
DAN with HR lower thoughts about decreasing corlanor? thx

## 2017-02-10 ENCOUNTER — Telehealth: Payer: Self-pay

## 2017-02-10 ENCOUNTER — Ambulatory Visit (INDEPENDENT_AMBULATORY_CARE_PROVIDER_SITE_OTHER): Payer: Self-pay

## 2017-02-10 DIAGNOSIS — I5022 Chronic systolic (congestive) heart failure: Secondary | ICD-10-CM

## 2017-02-10 DIAGNOSIS — Z950 Presence of cardiac pacemaker: Secondary | ICD-10-CM

## 2017-02-10 NOTE — Telephone Encounter (Signed)
Remote ICM transmission received.  Attempted call to patient and left detailed message per DPR regarding transmission and next ICM scheduled for 02/28/2017.  Advised to return call for any fluid symptoms or questions.

## 2017-02-10 NOTE — Progress Notes (Signed)
EPIC Encounter for ICM Monitoring  Patient Name: Alexandra [name] is a 84 y.o. female Date: 02/10/2017 Primary Care Physican: Lawerance Cruel, MD Primary Cardiologist:Skains/Bensimhon Electrophysiologist: Faustino Congress Weight:Prior [id number] Bi-V Pacing: >99%        Attempted call to patient and unable to reach.  Left detailed message regarding transmission.  Transmission reviewed.    Thoracic impedance normal.  Prescribed dosage: Furosemide 40 mg 1 tablet every other day. Potassium 10 mEq 1 tablet daily.  LABS: 01/27/2016 Creatinine 1.50, BUN 40, Potassium 3.8, Sodium 141, EGFR 31-36 07/21/2015 Creatinine 1.55, BUN 29, Potassium 3.8, Sodium 139 07/11/2015 Creatinine 1.65, BUN 27, Potassium 4.0, Sodium 138 06/23/2015 Creatinine 1.61, BUN 34, Potassium 4.1, Sodium 139  Recommendations: Left voice mail with ICM number and encouraged to call if experiencing any fluid symptoms.  Follow-up plan: ICM clinic phone appointment on 02/28/2017.    Copy of ICM check sent to Dr. Caryl Comes.   3 month ICM trend: 02/10/2017    1 Year ICM trend:       [name], RN 02/10/2017 1:58 PM

## 2017-02-12 ENCOUNTER — Telehealth: Payer: Self-pay | Admitting: Physician Assistant

## 2017-02-12 NOTE — Telephone Encounter (Signed)
Alexandra [name] is a 84 y.o. female with a history of systolic heart failure followed in the heart failure clinic, prior pulmonary embolism on chronic anticoagulation with Apixaban.  She called into the answering service with symptoms of knee swelling and pain after a fall several days ago.  It was cold and windy when she walked outside from a restaurant.  She slipped and fell.  She does note hitting her head lightly.  She did not think much of this.  However, she continues to have a dull headache.  She mainly called because she was concerned she could have a blood clot in her leg.  PLAN: I explained that it would be unlikely that she has a blood clot as she is on chronic anticoagulation.  However, given her history of chronic anticoagulation, recent fall and low level headache I think it would be best for her to be evaluated the emergency room with a head CT to rule out intracranial bleed.  It is possible she may have hemarthrosis as well.  Her knee may need to be tapped.    I spoke to her son as well.  They will consider whether or not to go the emergency room and make a decision later. Richardson Dopp, PA-C    02/12/2017 1:15 PM

## 2017-02-12 NOTE — Telephone Encounter (Signed)
Lets have her stop corlanor  L  This drug is used to slow HR in pts with heart failure or inappropriate sinus tachycardia  thx

## 2017-02-15 DIAGNOSIS — S8012XA Contusion of left lower leg, initial encounter: Secondary | ICD-10-CM | POA: Diagnosis not present

## 2017-02-15 DIAGNOSIS — M79606 Pain in leg, unspecified: Secondary | ICD-10-CM | POA: Diagnosis not present

## 2017-02-16 ENCOUNTER — Other Ambulatory Visit (HOSPITAL_COMMUNITY): Payer: Self-pay | Admitting: Internal Medicine

## 2017-02-17 NOTE — Telephone Encounter (Signed)
Dr Caryl Comes,  The patient wanted me to double check with you about stopping Corlanor. She stated "It's my only heart medicine and my pulse was normal at my last visit." Do you still want her to stop the medication?

## 2017-02-18 NOTE — Addendum Note (Signed)
Addended by: Dollene Primrose on: 02/18/2017 09:41 AM   Modules accepted: Orders

## 2017-02-18 NOTE — Telephone Encounter (Signed)
After speaking with Dr Caryl Comes regarding pt's concern for stopping the medication, he still recommended she stop the Corlanor. Pt understood, but was still concerned about stopping the medication. I instructed her to call the heart failure clinic if she begins to have symptoms after stopping the medication. She stated she understood and had no additional questions.

## 2017-02-28 ENCOUNTER — Ambulatory Visit (INDEPENDENT_AMBULATORY_CARE_PROVIDER_SITE_OTHER): Payer: Medicare HMO | Admitting: *Deleted

## 2017-02-28 DIAGNOSIS — I428 Other cardiomyopathies: Secondary | ICD-10-CM

## 2017-02-28 DIAGNOSIS — Z950 Presence of cardiac pacemaker: Secondary | ICD-10-CM | POA: Diagnosis not present

## 2017-02-28 DIAGNOSIS — I5022 Chronic systolic (congestive) heart failure: Secondary | ICD-10-CM | POA: Diagnosis not present

## 2017-03-01 ENCOUNTER — Telehealth: Payer: Self-pay

## 2017-03-01 NOTE — Telephone Encounter (Signed)
Remote ICM transmission received.  Attempted call to patient and no answer   

## 2017-03-01 NOTE — Progress Notes (Signed)
EPIC Encounter for ICM Monitoring  Patient Name: Alexandra [name] is a 84 y.o. female Date: 03/01/2017 Primary Care Physican: Lawerance Cruel, MD Primary Cardiologist:Skains/Bensimhon Electrophysiologist: Faustino Congress Weight:Prior [id number] Bi-V Pacing: >99%       Attempted call to patient and unable to reach.    Transmission reviewed.    Thoracic impedance normal.  Prescribed dosage: Furosemide 40 mg 1 tablet every other day. Potassium 10 mEq 1 tablet daily.  LABS: 01/27/2016 Creatinine 1.50, BUN 40, Potassium 3.8, Sodium 141, EGFR 31-36 07/21/2015 Creatinine 1.55, BUN 29, Potassium 3.8, Sodium 139 07/11/2015 Creatinine 1.65, BUN 27, Potassium 4.0, Sodium 138 06/23/2015 Creatinine 1.61, BUN 34, Potassium 4.1, Sodium 139  Recommendations: Left voice mail with ICM number and encouraged to call if experiencing any fluid symptoms.  Follow-up plan: ICM clinic phone appointment on 03/31/2017.   Copy of ICM check sent to Dr. Caryl Comes.   3 month ICM trend: 03/01/2017    1 Year ICM trend:       [name], RN 03/01/2017 12:24 PM

## 2017-03-01 NOTE — Progress Notes (Signed)
Remote ICD transmission.   

## 2017-03-02 ENCOUNTER — Encounter: Payer: Self-pay | Admitting: Cardiology

## 2017-03-08 LAB — CUP PACEART REMOTE DEVICE CHECK
Battery Remaining Longevity: 61 mo
Battery Voltage: 2.96 V
Brady Statistic AP VS Percent: 1 %
Brady Statistic AS VS Percent: 1 %
Date Time Interrogation Session: 20190211075848
Implantable Lead Implant Date: 20161229
Implantable Lead Implant Date: 20161229
Implantable Lead Location: 753858
Implantable Pulse Generator Implant Date: 20161229
Lead Channel Impedance Value: 410 Ohm
Lead Channel Pacing Threshold Amplitude: 0.75 V
Lead Channel Pacing Threshold Amplitude: 0.75 V
Lead Channel Pacing Threshold Amplitude: 2.375 V
Lead Channel Sensing Intrinsic Amplitude: 12 mV
Lead Channel Setting Pacing Amplitude: 2 V
Lead Channel Setting Pacing Amplitude: 3.125
Lead Channel Setting Sensing Sensitivity: 2 mV
MDC IDC LEAD IMPLANT DT: 20161229
MDC IDC LEAD LOCATION: 753859
MDC IDC LEAD LOCATION: 753860
MDC IDC MSMT BATTERY REMAINING PERCENTAGE: 95.5 %
MDC IDC MSMT LEADCHNL LV IMPEDANCE VALUE: 710 Ohm
MDC IDC MSMT LEADCHNL LV PACING THRESHOLD PULSEWIDTH: 1 ms
MDC IDC MSMT LEADCHNL RA PACING THRESHOLD PULSEWIDTH: 0.5 ms
MDC IDC MSMT LEADCHNL RA SENSING INTR AMPL: 2.4 mV
MDC IDC MSMT LEADCHNL RV IMPEDANCE VALUE: 480 Ohm
MDC IDC MSMT LEADCHNL RV PACING THRESHOLD PULSEWIDTH: 0.5 ms
MDC IDC PG SERIAL: 7834528
MDC IDC SET LEADCHNL LV PACING PULSEWIDTH: 1 ms
MDC IDC SET LEADCHNL RA PACING AMPLITUDE: 1.75 V
MDC IDC SET LEADCHNL RV PACING PULSEWIDTH: 0.5 ms
MDC IDC STAT BRADY AP VP PERCENT: 1 %
MDC IDC STAT BRADY AS VP PERCENT: 98 %
MDC IDC STAT BRADY RA PERCENT PACED: 1 %
Pulse Gen Model: 3262

## 2017-03-16 ENCOUNTER — Other Ambulatory Visit (HOSPITAL_COMMUNITY): Payer: Self-pay | Admitting: Internal Medicine

## 2017-03-31 ENCOUNTER — Telehealth: Payer: Self-pay | Admitting: Cardiology

## 2017-03-31 NOTE — Telephone Encounter (Signed)
LMOVM reminding pt to send remote transmission.   

## 2017-04-03 ENCOUNTER — Other Ambulatory Visit (HOSPITAL_COMMUNITY): Payer: Self-pay | Admitting: Internal Medicine

## 2017-04-04 ENCOUNTER — Ambulatory Visit (INDEPENDENT_AMBULATORY_CARE_PROVIDER_SITE_OTHER): Payer: Medicare HMO

## 2017-04-04 DIAGNOSIS — I5022 Chronic systolic (congestive) heart failure: Secondary | ICD-10-CM

## 2017-04-04 DIAGNOSIS — Z950 Presence of cardiac pacemaker: Secondary | ICD-10-CM | POA: Diagnosis not present

## 2017-04-05 NOTE — Progress Notes (Signed)
EPIC Encounter for ICM Monitoring  Patient Name: Alexandra [name] is a 84 y.o. female Date: 04/05/2017 Primary Care Physican: Lawerance Cruel, MD Primary Cardiologist:Skains/Bensimhon Electrophysiologist: Caryl Comes Dry Weight:158lbs Bi-V Pacing: 99%       Heart Failure questions reviewed, pt asymptomatic. Her diet for the last 8 days consisted of foods high in salt due to she stayed with daughter to help out after she had surgery.      Thoracic impedance abnormal suggesting fluid accumulation starting 03/29/2017.  Prescribed dosage: Furosemide 40 mg 1 tablet every other day. Potassium 10 mEq 1 tablet daily.  LABS: 01/27/2016 Creatinine 1.50, BUN 40, Potassium 3.8, Sodium 141, EGFR 31-36 07/21/2015 Creatinine 1.55, BUN 29, Potassium 3.8, Sodium 139 07/11/2015 Creatinine 1.65, BUN 27, Potassium 4.0, Sodium 138 06/23/2015 Creatinine 1.61, BUN 34, Potassium 4.1, Sodium 139  Recommendations:  Reviewed with Dr Caryl Comes in the office.  Call back to patient and reported Dr Caryl Comes advised to limit salt in diet and recheck fluid levels next week.  No medication change at this time.     Follow-up plan: ICM clinic phone appointment on 04/14/2017.    Copy of ICM check sent to Dr. Haroldine Laws and Dr. Caryl Comes.   3 month ICM trend: 03/07/2017    1 Year ICM trend:       [name], RN 04/05/2017 12:24 PM

## 2017-04-06 ENCOUNTER — Telehealth (HOSPITAL_COMMUNITY): Payer: Self-pay | Admitting: *Deleted

## 2017-04-06 NOTE — Telephone Encounter (Signed)
Pt left a voice message requesting a call back about her medication Corlanor. I called patient back to get more information pt did not answer. I left VM requesting a return call.

## 2017-04-12 ENCOUNTER — Other Ambulatory Visit (HOSPITAL_COMMUNITY): Payer: Self-pay | Admitting: Internal Medicine

## 2017-04-14 ENCOUNTER — Telehealth: Payer: Self-pay

## 2017-04-14 ENCOUNTER — Ambulatory Visit (INDEPENDENT_AMBULATORY_CARE_PROVIDER_SITE_OTHER): Payer: Self-pay

## 2017-04-14 DIAGNOSIS — I5022 Chronic systolic (congestive) heart failure: Secondary | ICD-10-CM

## 2017-04-14 DIAGNOSIS — Z950 Presence of cardiac pacemaker: Secondary | ICD-10-CM

## 2017-04-14 NOTE — Telephone Encounter (Signed)
LMOVM requesting that pt send manual transmission 

## 2017-04-15 NOTE — Progress Notes (Signed)
EPIC Encounter for ICM Monitoring  Patient Name: Alexandra [name] is a 84 y.o. female Date: 04/15/2017 Primary Care Physican: Lawerance Cruel, MD Primary Cardiologist:Skains/Bensimhon Electrophysiologist: Caryl Comes Dry Weight:157lbs Bi-V Pacing: 99%        Heart Failure questions reviewed, pt asymptomatic.   Thoracic impedance returned to normal after adjusting diet.  Prescribed dosage:Furosemide 40 mg 1 tablet every other day. Potassium 10 mEq 1 tablet daily.  LABS: 01/27/2016 Creatinine 1.50, BUN 40, Potassium 3.8, Sodium 141, EGFR 31-36 07/21/2015 Creatinine 1.55, BUN 29, Potassium 3.8, Sodium 139 07/11/2015 Creatinine 1.65, BUN 27, Potassium 4.0, Sodium 138 06/23/2015 Creatinine 1.61, BUN 34, Potassium 4.1, Sodium 139   Recommendations: No changes.  Encouraged to call for fluid symptoms.  Follow-up plan: ICM clinic phone appointment on 05/05/2017.    Copy of ICM check sent to Dr. Caryl Comes.   3 month ICM trend: 04/14/2017    1 Year ICM trend:       [name], RN 04/15/2017 7:52 AM

## 2017-04-19 ENCOUNTER — Other Ambulatory Visit (HOSPITAL_COMMUNITY): Payer: Self-pay | Admitting: Internal Medicine

## 2017-04-22 ENCOUNTER — Telehealth (HOSPITAL_COMMUNITY): Payer: Self-pay

## 2017-04-22 NOTE — Telephone Encounter (Signed)
Medication Samples have been provided to the patient.  Drug name: Eliquis       Strength: 2.5 mg        Qty: 2  LOT: [id number]  Exp.Date: 07/2018  Dosing instructions: TAKE 1 TAB MY MOUTH TWICE A DAY   The patient has been instructed regarding the correct time, dose, and frequency of taking this medication, including desired effects and most common side effects.   Shirley Muscat 1:23 PM 04/22/2017

## 2017-04-25 ENCOUNTER — Other Ambulatory Visit (HOSPITAL_COMMUNITY): Payer: Self-pay | Admitting: Internal Medicine

## 2017-05-05 ENCOUNTER — Ambulatory Visit (INDEPENDENT_AMBULATORY_CARE_PROVIDER_SITE_OTHER): Payer: Medicare HMO

## 2017-05-05 ENCOUNTER — Telehealth: Payer: Self-pay | Admitting: Cardiology

## 2017-05-05 DIAGNOSIS — Z950 Presence of cardiac pacemaker: Secondary | ICD-10-CM

## 2017-05-05 DIAGNOSIS — I5022 Chronic systolic (congestive) heart failure: Secondary | ICD-10-CM

## 2017-05-05 NOTE — Telephone Encounter (Signed)
LMOVM reminding pt to send remote transmission.   

## 2017-05-09 NOTE — Progress Notes (Signed)
EPIC Encounter for ICM Monitoring  Patient Name: Alexandra [name] is a 84 y.o. female Date: 05/09/2017 Primary Care Physican: Lawerance Cruel, MD Primary Cardiologist:Skains/Bensimhon Electrophysiologist: Caryl Comes Dry Weight:158lbs Bi-V Pacing: 99%       Heart Failure questions reviewed, pt asymptomatic.   Thoracic impedance close to normal but was abnormal suggesting fluid accumulation from 04/07/2017 to 05/08/2017.  Prescribed dosage: Furosemide 40 mg 1 tablet every other day. Potassium 10 mEq 1 tablet daily.  LABS: 01/27/2016 Creatinine 1.50, BUN 40, Potassium 3.8, Sodium 141, EGFR 31-36 07/21/2015 Creatinine 1.55, BUN 29, Potassium 3.8, Sodium 139 07/11/2015 Creatinine 1.65, BUN 27, Potassium 4.0, Sodium 138 06/23/2015 Creatinine 1.61, BUN 34, Potassium 4.1, Sodium 139  Recommendations: No changes.   Encouraged to call for fluid symptoms.  Follow-up plan: ICM clinic phone appointment on 06/09/2017.    Copy of ICM check sent to Dr. Caryl Comes.   3 month ICM trend: 05/09/2017    1 Year ICM trend:       [name], RN 05/09/2017 3:15 PM

## 2017-05-16 DIAGNOSIS — F430 Acute stress reaction: Secondary | ICD-10-CM | POA: Diagnosis not present

## 2017-05-16 DIAGNOSIS — I10 Essential (primary) hypertension: Secondary | ICD-10-CM | POA: Diagnosis not present

## 2017-05-16 DIAGNOSIS — Z0000 Encounter for general adult medical examination without abnormal findings: Secondary | ICD-10-CM | POA: Diagnosis not present

## 2017-05-16 DIAGNOSIS — R6889 Other general symptoms and signs: Secondary | ICD-10-CM | POA: Diagnosis not present

## 2017-05-16 DIAGNOSIS — E559 Vitamin D deficiency, unspecified: Secondary | ICD-10-CM | POA: Diagnosis not present

## 2017-05-16 DIAGNOSIS — D649 Anemia, unspecified: Secondary | ICD-10-CM | POA: Diagnosis not present

## 2017-05-16 DIAGNOSIS — N184 Chronic kidney disease, stage 4 (severe): Secondary | ICD-10-CM | POA: Diagnosis not present

## 2017-05-16 DIAGNOSIS — J4520 Mild intermittent asthma, uncomplicated: Secondary | ICD-10-CM | POA: Diagnosis not present

## 2017-05-16 DIAGNOSIS — K219 Gastro-esophageal reflux disease without esophagitis: Secondary | ICD-10-CM | POA: Diagnosis not present

## 2017-05-16 DIAGNOSIS — E039 Hypothyroidism, unspecified: Secondary | ICD-10-CM | POA: Diagnosis not present

## 2017-05-18 ENCOUNTER — Ambulatory Visit (INDEPENDENT_AMBULATORY_CARE_PROVIDER_SITE_OTHER): Payer: Medicare HMO

## 2017-05-18 ENCOUNTER — Ambulatory Visit (INDEPENDENT_AMBULATORY_CARE_PROVIDER_SITE_OTHER): Payer: Commercial Managed Care - HMO | Admitting: Orthopaedic Surgery

## 2017-05-18 ENCOUNTER — Encounter (INDEPENDENT_AMBULATORY_CARE_PROVIDER_SITE_OTHER): Payer: Self-pay | Admitting: Orthopaedic Surgery

## 2017-05-18 DIAGNOSIS — M7061 Trochanteric bursitis, right hip: Secondary | ICD-10-CM

## 2017-05-18 DIAGNOSIS — G8929 Other chronic pain: Secondary | ICD-10-CM | POA: Diagnosis not present

## 2017-05-18 DIAGNOSIS — M1711 Unilateral primary osteoarthritis, right knee: Secondary | ICD-10-CM | POA: Diagnosis not present

## 2017-05-18 DIAGNOSIS — M25561 Pain in right knee: Secondary | ICD-10-CM | POA: Diagnosis not present

## 2017-05-18 MED ORDER — LIDOCAINE HCL 1 % IJ SOLN
3.0000 mL | INTRAMUSCULAR | Status: AC | PRN
  Administered 2017-05-18: 3 mL

## 2017-05-18 MED ORDER — METHYLPREDNISOLONE ACETATE 40 MG/ML IJ SUSP
40.0000 mg | INTRAMUSCULAR | Status: AC | PRN
  Administered 2017-05-18: 40 mg via INTRA_ARTICULAR

## 2017-05-18 NOTE — Progress Notes (Signed)
Office Visit Note   Patient: Alexandra [name]           Date of Birth: [birth year]           MRN: [id number] Visit Date: 05/18/2017              Requested by: Lawerance Cruel, [place], [place] [address] PCP: Lawerance Cruel, MD   Assessment & Plan: Visit Diagnoses:  1. Chronic pain of right knee   2. Unilateral primary osteoarthritis, right knee   3. Trochanteric bursitis, right hip     Plan: She does have significant arthritis in her right knee and I do feel she benefit from a steroid injection.  We will try to stay as conservative as possible.  I do feel that the trochanteric bursitis and IT band syndrome on the right hip and thigh are related to her trying to compensate for dealing with her knees.  She is appropriate candidate for outpatient physical therapy as well and she would like to try this.  I did place steroid injections but the trochanteric area of right hip and her right knee.  I counseled her about the risk and benefits of these injections.  We will see her back in about 6 weeks to see how she is doing overall.  All questions concerns were answered and addressed.  Follow-Up Instructions: Return in about 6 weeks (around 06/29/2017).   Orders:  Orders Placed This Encounter  Procedures  . Large Joint Inj  . Large Joint Inj  . XR Knee 1-2 Views Right   No orders of the defined types were placed in this encounter.     Procedures: Large Joint Inj: R knee on 05/18/2017 2:45 PM Indications: diagnostic evaluation and pain Details: 22 G 1.5 in needle, superolateral approach  Arthrogram: No  Medications: 3 mL lidocaine 1 %; 40 mg methylPREDNISolone acetate 40 MG/ML Outcome: tolerated well, no immediate complications Procedure, treatment alternatives, risks and benefits explained, specific risks discussed. Consent was given by the patient. Immediately prior to procedure a time out was called to verify the correct patient, procedure, equipment,  support staff and site/side marked as required. Patient was prepped and draped in the usual sterile fashion.   Large Joint Inj: R greater trochanter on 05/18/2017 2:46 PM Indications: pain and diagnostic evaluation Details: 22 G 1.5 in needle, lateral approach  Arthrogram: No  Medications: 3 mL lidocaine 1 %; 40 mg methylPREDNISolone acetate 40 MG/ML Outcome: tolerated well, no immediate complications Procedure, treatment alternatives, risks and benefits explained, specific risks discussed. Consent was given by the patient. Immediately prior to procedure a time out was called to verify the correct patient, procedure, equipment, support staff and site/side marked as required. Patient was prepped and draped in the usual sterile fashion.       Clinical Data: No additional findings.   Subjective: Chief Complaint  Patient presents with  . Right Knee - Pain  Patient is a very pleasant 84 year old female who comes in for evaluation treatment of chronic right knee pain which is also been having some hip pain but she points of the trochanteric area of her hip as a source of her pain.  She is 84 years old and is trying to be as active as she can she ambulates with a quad cane.  She has significant heart issues with congestive heart failure.  She is problems going up and down stairs and getting up from a seated position not on any  blood thinners.  She is not diabetic.  She has.  She denies any specific injuries.  HPI  Review of Systems There is currently no chest pain or shortness of breath.  Is no fever chills or nausea and vomiting.  Objective: Vital Signs: There were no vitals taken for this visit.  Physical Exam She is alert and oriented no acute distress Ortho Exam Examination of her right knee shows both medial lateral joint line tenderness.  There is only slight patellofemoral crepitation with good range of motion of the knee overall.  She does have significant pain over the iliotibial  band and the trochanteric area of the right hip. Specialty Comments:  No specialty comments available.  Imaging: Xr Knee 1-2 Views Right  Result Date: 05/18/2017 2 views of the right knee show severe arthritic changes with joint space narrowing combined with periarticular osteophytes and sclerotic changes.    PMFS History: Patient Active Problem List   Diagnosis Date Noted  . Dyspnea   . Chronic systolic heart failure ([place])   . Pain in left shin 02/07/2015  . Leukocytosis 01/31/2015  . Chronic systolic CHF (congestive heart failure), NYHA class 3 ([place]) 01/16/2015  . Protein-calorie malnutrition, severe ([place]) 01/15/2015  . Infection due to Corynebacterium diphtheriae 12/24/2014  . Bacteremia   . Fever 12/19/2014  . Chronic systolic CHF (congestive heart failure), NYHA class 4 ([place]) 10/23/2014  . Situational depression   . Frequent headaches   . Acute on chronic combined systolic and diastolic CHF, NYHA class 4 ([place]) 10/11/2014  . Acute on chronic systolic heart failure ([place]) 10/10/2014  . Pulmonary embolism ([place]) 10/10/2014  . Chronic anticoagulation 10/10/2014  . Acute on chronic combined systolic and diastolic heart failure ([place]) 08/19/2014  . Acute pulmonary embolism ([place]) 08/19/2014  . Takotsubo syndrome 08/19/2014  . HCAP (healthcare-associated pneumonia) 08/17/2014  . H/O Asthma 08/16/2014  . LBBB (left bundle branch block) 08/16/2014  . Cardiomyopathy- etiology not yet determined 08/16/2014  . PSVT (paroxysmal supraventricular tachycardia) ([place]) 08/16/2014  . CKD (chronic kidney disease) stage 3, GFR 30-59 ml/min (HCC) 08/13/2014  . Anemia of chronic disease 08/13/2014  . Hypothyroidism 08/13/2014  . Acute cholecystitis 08/11/2014  . Abdominal pain 08/11/2014  . Essential (primary) hypertension 04/02/2014   Past Medical History:  Diagnosis Date  . Adult hypothyroidism 04/02/2014  . Anemia of chronic disease 08/13/2014  . Anxiety   . Arthritis   . Asthma   .  breast ca dx'd 2000   left  . CAP (community acquired pneumonia) 12/19/2012  . Chest pain    NM stress test 05/2011 Normal  . CHF (congestive heart failure) ([place]) 08/19/2014  . CKD (chronic kidney disease) stage 3, GFR 30-59 ml/min (HCC) 08/13/2014  . Depression   . DOE (dyspnea on exertion)    No SOB at rest  . Essential (primary) hypertension 04/02/2014  . Fatigue    excertional  . GYJEHUDJ(497.0)   . Hereditary and idiopathic peripheral neuropathy 07/02/2014  . Hypercholesteremia   . Intermittent LBBB (left bundle branch block) 12/21/2012  . Leg weakness   . Neuropathy   . Obesity   . Osteopenia   . Peripheral neuropathy   . Presence of permanent cardiac pacemaker 01/16/2015   BIV  . Sacroiliitis, not elsewhere classified ([place])   . Situational depression   . Trigger finger    [name]    Family History  Problem Relation Age of Onset  . Pneumonia Mother 65       cause of  death  . Diabetes Brother   . Pancreatic cancer Father 83  . Alzheimer's disease Sister   . CVA Daughter        01/21/09  . Stroke Daughter   . Hypertension Daughter   . Heart attack Neg Hx     Past Surgical History:  Procedure Laterality Date  . ACHILLES TENDON SURGERY    . BREAST LUMPECTOMY     breast cancer  . CARDIAC CATHETERIZATION N/A 07/21/2015   Procedure: Right Heart Cath;  Surgeon: Jolaine Artist, MD;  Location: [facility];  Service: Cardiovascular;  Laterality: N/A;  . CHOLECYSTECTOMY N/A 08/13/2014   Procedure: LAPAROSCOPIC CHOLECYSTECTOMY;  Surgeon: Stark Klein, MD;  Location: [facility];  Service: General;  Laterality: N/A;  . EP IMPLANTABLE DEVICE N/A 01/16/2015   Procedure: BiV Pacemaker Insertion CRT-P;  Surgeon: Deboraha Sprang, MD;  Location: [facility];  Service: Cardiovascular;  Laterality: N/A;  . LEG SURGERY    . LUMBAR LAMINECTOMY    . POLYPECTOMY    . TONSILLECTOMY    . VESICOVAGINAL FISTULA CLOSURE W/ TAH     DC hysterectomy   Social History    Occupational History  . Occupation: Retired  Tobacco Use  . Smoking status: Never Smoker  . Smokeless tobacco: Never Used  Substance and Sexual Activity  . Alcohol use: No    Alcohol/week: 0.0 oz  . Drug use: No  . Sexual activity: Never

## 2017-05-19 ENCOUNTER — Telehealth (INDEPENDENT_AMBULATORY_CARE_PROVIDER_SITE_OTHER): Payer: Self-pay | Admitting: Orthopedic Surgery

## 2017-05-19 NOTE — Telephone Encounter (Signed)
Patient called in re to appt for 6/17. She will be coming back to get another inj(cortizone)and wants to come on 6/6 instead.  She wants to speak with someone in [facility]'s office to see if this is too soon to r/s.  Call patient back to advise.

## 2017-05-20 ENCOUNTER — Other Ambulatory Visit (INDEPENDENT_AMBULATORY_CARE_PROVIDER_SITE_OTHER): Payer: Self-pay

## 2017-05-20 DIAGNOSIS — M25551 Pain in right hip: Secondary | ICD-10-CM

## 2017-05-20 NOTE — Telephone Encounter (Signed)
This is a CB pt 

## 2017-05-20 NOTE — Telephone Encounter (Signed)
Patient aware it is way to earlier for another injection, her appt in 6 weeks was for checkup not another injection

## 2017-05-24 ENCOUNTER — Telehealth (HOSPITAL_COMMUNITY): Payer: Self-pay

## 2017-05-24 ENCOUNTER — Telehealth (HOSPITAL_COMMUNITY): Payer: Self-pay | Admitting: Pharmacist

## 2017-05-24 NOTE — Telephone Encounter (Signed)
Patient left VM on CHF clinic triage line requesting Rx to be sent to [facility]. Left return VM to get more info of need (does she need new application sent as well? Has she met 3% annual income?)  Leory Plowman, Guinevere Ferrari, RN

## 2017-05-24 NOTE — Telephone Encounter (Signed)
Patient called stating that [facility] needed an updated Rx for her Eliquis. It has been faxed and VM left on patient's phone.   [name]. Velva Harman, PharmD, BCPS, CPP Clinical Pharmacist Phone: [phone] 05/24/2017 3:34 PM

## 2017-05-27 ENCOUNTER — Encounter (HOSPITAL_COMMUNITY): Payer: Self-pay | Admitting: Internal Medicine

## 2017-05-27 ENCOUNTER — Ambulatory Visit (HOSPITAL_COMMUNITY)
Admission: RE | Admit: 2017-05-27 | Discharge: 2017-05-27 | Disposition: A | Discharge status: Home / Self Care | Payer: Medicare HMO | Source: Ambulatory Visit | Attending: Internal Medicine | Admitting: Internal Medicine

## 2017-05-27 ENCOUNTER — Other Ambulatory Visit: Payer: Self-pay

## 2017-05-27 VITALS — BP 144/66 | HR 84 | Wt 160.8 lb

## 2017-05-27 DIAGNOSIS — Z79899 Other long term (current) drug therapy: Secondary | ICD-10-CM | POA: Diagnosis not present

## 2017-05-27 DIAGNOSIS — E039 Hypothyroidism, unspecified: Secondary | ICD-10-CM | POA: Insufficient documentation

## 2017-05-27 DIAGNOSIS — Z950 Presence of cardiac pacemaker: Secondary | ICD-10-CM | POA: Insufficient documentation

## 2017-05-27 DIAGNOSIS — I10 Essential (primary) hypertension: Secondary | ICD-10-CM | POA: Diagnosis not present

## 2017-05-27 DIAGNOSIS — Z86718 Personal history of other venous thrombosis and embolism: Secondary | ICD-10-CM | POA: Diagnosis not present

## 2017-05-27 DIAGNOSIS — R6889 Other general symptoms and signs: Secondary | ICD-10-CM | POA: Diagnosis not present

## 2017-05-27 DIAGNOSIS — Z7901 Long term (current) use of anticoagulants: Secondary | ICD-10-CM | POA: Insufficient documentation

## 2017-05-27 DIAGNOSIS — E7800 Pure hypercholesterolemia, unspecified: Secondary | ICD-10-CM | POA: Diagnosis not present

## 2017-05-27 DIAGNOSIS — Z86711 Personal history of pulmonary embolism: Secondary | ICD-10-CM | POA: Diagnosis not present

## 2017-05-27 DIAGNOSIS — Z888 Allergy status to other drugs, medicaments and biological substances status: Secondary | ICD-10-CM | POA: Diagnosis not present

## 2017-05-27 DIAGNOSIS — I5022 Chronic systolic (congestive) heart failure: Secondary | ICD-10-CM | POA: Diagnosis not present

## 2017-05-27 DIAGNOSIS — Z853 Personal history of malignant neoplasm of breast: Secondary | ICD-10-CM | POA: Diagnosis not present

## 2017-05-27 DIAGNOSIS — M8580 Other specified disorders of bone density and structure, unspecified site: Secondary | ICD-10-CM | POA: Insufficient documentation

## 2017-05-27 DIAGNOSIS — I130 Hypertensive heart and chronic kidney disease with heart failure and stage 1 through stage 4 chronic kidney disease, or unspecified chronic kidney disease: Secondary | ICD-10-CM | POA: Diagnosis not present

## 2017-05-27 DIAGNOSIS — N183 Chronic kidney disease, stage 3 unspecified: Secondary | ICD-10-CM

## 2017-05-27 DIAGNOSIS — F419 Anxiety disorder, unspecified: Secondary | ICD-10-CM | POA: Diagnosis not present

## 2017-05-27 DIAGNOSIS — I447 Left bundle-branch block, unspecified: Secondary | ICD-10-CM

## 2017-05-27 DIAGNOSIS — E669 Obesity, unspecified: Secondary | ICD-10-CM | POA: Insufficient documentation

## 2017-05-27 DIAGNOSIS — J45909 Unspecified asthma, uncomplicated: Secondary | ICD-10-CM | POA: Diagnosis not present

## 2017-05-27 DIAGNOSIS — F329 Major depressive disorder, single episode, unspecified: Secondary | ICD-10-CM | POA: Diagnosis not present

## 2017-05-27 LAB — BASIC METABOLIC PANEL
Anion gap: 8 (ref 5–15)
BUN: 24 mg/dL — ABNORMAL HIGH (ref 6–20)
CO2: 31 mmol/L (ref 22–32)
Calcium: 9.7 mg/dL (ref 8.9–10.3)
Chloride: 100 mmol/L — ABNORMAL LOW (ref 101–111)
Creatinine, Ser: 1.41 mg/dL — ABNORMAL HIGH (ref 0.44–1.00)
GFR calc Af Amer: 38 mL/min — ABNORMAL LOW (ref >60)
GFR, EST NON AFRICAN AMERICAN: 32 mL/min — AB (ref >60)
Glucose, Bld: 103 mg/dL — ABNORMAL HIGH (ref 65–99)
POTASSIUM: 4.8 mmol/L (ref 3.5–5.1)
SODIUM: 139 mmol/L (ref 135–145)

## 2017-05-27 NOTE — Progress Notes (Signed)
Patient ID: Alexandra [name], female   DOB: [birth year], 83 y.o.   MRN: [id number]     Advanced Heart Failure Clinic Note   Primary Care: Dr. Melinda Crutch Primary Cardiologist: Dr Candee Furbish Primary HF: Dr Haroldine Laws  HPI: Alexandra [name] is a 84 y.o. 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 Discharge weight was 196.  She was admitted in July 2016 for Abdominal pain and SOB. Her Gallbladder was thought to be causing her pain, so a lap choley was performed that admission. Echo on 08/15/14 showed EF 20%. There were thoughts that she may have stress induced CMP due to the death of her husband in May 23, 2022 after several months of hospice care.   She was directly admitted to [facility] on 10/11/14 with concerns for low output with SBPs in 80s and tachycardia.  PICC line was placed with initial co-ox of 61%.CVP was 15. Milrinone started when co-ox dropped to 53%. She had SVT on milrinone 0.25 so started on amio. Developed acute SOB that improved quickly with cessation of amio and extra dose of IV lasix. Felt to possibly have acute amio toxicity. Milrinone decreased to 0.125 and no further PSVT. Sent home on milrinone 0.125. Diuresed well on 80 mg lasix IB BID. Discharge weight was 170.  Admitted 12/1 through 12/6 with PICC line infection. Bcx with [facility] 2/2 with for diptheroids. Bcx 1/2 at [facility] GPR-Bacteremia-cornybacterium . PICC line replaced. She continued on milrinone 0.125 mcg.   S/p CRT-D placement 01/16/15 with Dr Caryl Comes.   Labs 01/29/15  with WBCs trending up 7.9 -> 10 -> 12. BCx ordered with [facility]. On 01/31/15 [facility] with 1/2 BCX + GPC clusters. (I do not have final results from them) Refused admission. Treated with home vancomycin for 1 week. PICC line not changed.   Echo 2/17 EF  ~40%  Milrinone stopped 02/2015. Has had no major issues since.   She presents today for regular follow up. Overall feeling well but still says she feels fatigued. Alexandra [name] said can do all ADLs and errands without much difficulty. Says she gets an occasional dry cough, but no associated CP or SOB. She says her cough usually occurs during the night. Denies orthopnea or PND. Doing yoga and trying to stay in shape. Limited by Knee and hip arthritis. Sees Dr. Rush Farmer. Denies edema. Doesn't take any extra lasix. Taking all medications as directed.   ICD interrogated personally: Pt activity 2-4 hours daily, with some days as much as 6-8 hrs. Thoracic impedence near normal. No VT/VF.   RHC 7/17 RA = 1 RV = 25/2 PA = 29/9 (15) PCW = 6 Fick cardiac output/index = 5.9/3.3 PVR = 1.5 WU Ao sat = 98% PA sat = 68%, 70%, 71%  ECHO 10/02/14 LVEF 15% with diffuse hypokinesis, RV mildly dilated, normal function, PA peak pressure 59 mmHg Echo 2/17 EF 40-45% (I fetlt 35%)   Labs 3/17: Hgb 10.8, K 4.6 Cr 1.38   Past Medical History:  Diagnosis Date  . Adult hypothyroidism 04/02/2014  . Anemia of chronic disease 08/13/2014  . Anxiety   . Arthritis   . Asthma   . breast ca dx'd 2000   left  . CAP (community acquired pneumonia) 12/19/2012  . Chest pain  NM stress test 05/2011 Normal  . CHF (congestive heart failure) ([place]) 08/19/2014  . CKD (chronic kidney disease) stage 3, GFR 30-59 ml/min (HCC) 08/13/2014  . Depression   . DOE (dyspnea on exertion)    No SOB at rest  . Essential (primary) hypertension 04/02/2014  . Fatigue    excertional  . WSFKCLEX(517.0)   . Hereditary and idiopathic peripheral neuropathy 07/02/2014  . Hypercholesteremia   . Intermittent LBBB (left bundle branch block) 12/21/2012  . Leg weakness   . Neuropathy   . Obesity   . Osteopenia   . Peripheral neuropathy   . Presence of permanent cardiac pacemaker 01/16/2015   BIV  . Sacroiliitis, not elsewhere classified ([place])   .  Situational depression   . Trigger finger    [name]    Current Outpatient Medications  Medication Sig Dispense Refill  . acetaminophen (TYLENOL) 500 MG tablet Take 500 mg by mouth every 6 (six) hours as needed for mild pain or headache. Reported on 06/17/2015    . albuterol (PROVENTIL HFA;VENTOLIN HFA) 108 (90 BASE) MCG/ACT inhaler Inhale 2 puffs into the lungs every 6 (six) hours as needed for wheezing or shortness of breath. Reported on 05/27/2015    . ALPRAZolam (XANAX) 0.25 MG tablet Take 0.25 mg by mouth 3 (three) times daily as needed for anxiety. Reported on 05/27/2015    . amoxicillin (AMOXIL) 500 MG capsule Take 4 capsules by mouth as needed 1 hour before dental work 4 capsule prn  . b complex vitamins tablet Take 1 tablet by mouth daily with lunch.     . calcium carbonate (TUMS - DOSED IN MG ELEMENTAL CALCIUM) 500 MG chewable tablet Chew 2 tablets by mouth daily as needed for indigestion or heartburn. Reported on 07/18/2015    . Calcium Carbonate-Vitamin D (CALTRATE 600+D PO) Take 1 tablet by mouth daily.     . cholecalciferol (VITAMIN D) 1000 UNITS tablet Take 1,000 Units by mouth daily with lunch.     . citalopram (CELEXA) 20 MG tablet TAKE 1 TABLET(20 MG) BY MOUTH DAILY 30 tablet 0  . ELIQUIS 2.5 MG TABS tablet TAKE 1 TABLET(2.5 MG) BY MOUTH TWICE DAILY 60 tablet 1  . famotidine (PEPCID) 20 MG tablet Take 20 mg by mouth daily.    . ferrous sulfate 325 (65 FE) MG tablet Take 325 mg by mouth daily with breakfast.    . furosemide (LASIX) 40 MG tablet Take one tablet (40 mg) by mouth once every other day    . hydrALAZINE (APRESOLINE) 25 MG tablet TAKE 1 TABLET BY MOUTH THREE TIMES DAILY 270 tablet 0  . lactose free nutrition (BOOST) LIQD Take 237 mLs by mouth daily with breakfast.     . levothyroxine (SYNTHROID, LEVOTHROID) 25 MCG tablet Take 25 mcg by mouth daily before breakfast. for thyroid    . MAGNESIUM-OXIDE 400 (241.3 Mg) MG tablet TAKE 1/2 TABLET(200 MG) BY MOUTH DAILY 15  tablet 11  . Multiple Vitamin (MULITIVITAMIN WITH MINERALS) TABS Take 1 tablet by mouth daily with lunch.     . nitroGLYCERIN (NITROSTAT) 0.4 MG SL tablet Place 1 tablet (0.4 mg total) under the tongue every 5 (five) minutes as needed for chest pain. 25 tablet 4  . polycarbophil (FIBERCON) 625 MG tablet Take 625 mg by mouth daily.    Vladimir Faster Glycol-Propyl Glycol (SYSTANE OP) Place 1 drop into both eyes 2 (two) times daily.    . potassium chloride (K-DUR) 10 MEQ tablet TAKE 1 TABLET BY  MOUTH DAILY 90 tablet 2  . spironolactone (ALDACTONE) 25 MG tablet TAKE 1 TABLET(25 MG) BY MOUTH DAILY 90 tablet 0   No current facility-administered medications for this encounter.     Allergies  Allergen Reactions  . Amiodarone Shortness Of Breath and Nausea And Vomiting  . Cymbalta [Duloxetine Hcl] Other (See Comments)    Depression  . Lyrica [Pregabalin] Other (See Comments)    Blurry vision      Social History   Socioeconomic History  . Marital status: Married    Spouse name: Not on file  . Number of children: 5  . Years of education: HS  . Highest education level: Not on file  Occupational History  . Occupation: Retired  Scientific laboratory technician  . Financial resource strain: Not on file  . Food insecurity:    Worry: Not on file    Inability: Not on file  . Transportation needs:    Medical: Not on file    Non-medical: Not on file  Tobacco Use  . Smoking status: Never Smoker  . Smokeless tobacco: Never Used  Substance and Sexual Activity  . Alcohol use: No    Alcohol/week: 0.0 oz  . Drug use: No  . Sexual activity: Never  Lifestyle  . Physical activity:    Days per week: Not on file    Minutes per session: Not on file  . Stress: Not on file  Relationships  . Social connections:    Talks on phone: Not on file    Gets together: Not on file    Attends religious service: Not on file    Active member of club or organization: Not on file    Attends meetings of clubs or organizations: Not  on file    Relationship status: Not on file  . Intimate partner violence:    Fear of current or ex partner: Not on file    Emotionally abused: Not on file    Physically abused: Not on file    Forced sexual activity: Not on file  Other Topics Concern  . Not on file  Social History Narrative   Lives at home alone.   Right-handed.   Two cups caffeine daily (coffee).      Family History  Problem Relation Age of Onset  . Pneumonia Mother 70       cause of death  . Diabetes Brother   . Pancreatic cancer Father 30  . Alzheimer's disease Sister   . CVA Daughter        01/21/09  . Stroke Daughter   . Hypertension Daughter   . Heart attack Neg Hx     Vitals:   05/27/17 1336  BP: (!) 144/66  Pulse: 84  SpO2: 99%  Weight: 160 lb 12 oz (72.9 kg)   Wt Readings from Last 3 Encounters:  05/27/17 160 lb 12 oz (72.9 kg)  01/26/17 158 lb 3.2 oz (71.8 kg)  11/11/16 157 lb (71.2 kg)    PHYSICAL EXAM: General: Elderly appearing. No resp difficulty. HEENT: Normal anicteric Neck: Supple. JVP 5-6. Carotids 2+ bilat; no bruits. No thyromegaly or nodule noted. Cor: PMI nondisplaced. RRR, No M/G/R noted Lungs: CTAB, normal effort.no wheeze Abdomen: Soft, non-tender, non-distended, no HSM. No bruits or masses. +BS  Extremities: no cyanosis, clubbing, rash, edema Neuro: alert & oriented x 3, cranial nerves grossly intact. moves all 4 extremities w/o difficulty. Affect pleasant  ASSESSMENT & PLAN:  1. Chronic Systolic Heart failure - Echo 10/02/14 EF 15% Echo 2/17  read as 40-45% reviewed probably closer to 35%. Off milrinone. s/p St Jude CRT-D placement. 01/16/15 - Echo 11/2016 LVEF 45/50%, Grade 1 DD, Moderate AI - NYHA II-III symptoms - Volume status stable on exam.  - Continue lasix 40 mg every other day. Can take extra 40 mg as needed.  - Continue hydralazine 25 mg TID - Continue spiro 25 mg daily.  - Previously failed bblocker. No ACE due to CKD,.  - Off Digoxin and corlanor with  bradycardia. - Reinforced fluid restriction to < 2 L daily, sodium restriction to less than 2000 mg daily, and the importance of daily weights.   2. H/o PE/ DVT bilateral by [place] 08/20/2014 - Continue apixaban 2.5 bid for long-term prevention of DVT per Amplify EXT trial - No change to current plan.   3. CKD Stage 3  - BMET today.  4. Hx of LBBB - s/p CRT-D. Sees Dr. Caryl Comes. 5. Hypertension  - Mildly elevated as above.  6. Intolerance of amiodarone due to possible acute lung toxicity - No change to current plan.    Doing well overall. Labs today. RTC 9 months.  Shirley Friar, PA-C  1:40 PM  Patient seen and examined with the above-signed Advanced Practice Provider and/or Housestaff. I personally reviewed laboratory data, imaging studies and relevant notes. I independently examined the patient and formulated the important aspects of the plan. I have edited the note to reflect any of my changes or salient points. I have personally discussed the plan with the patient and/or family.  Doing remarkably well after CRT-D placement several years ago. Still gets fatigued but her functional capacity seems quite remarkable for her age. Volume status looks good. ICD interrogated personally as above. Continue reassurance.   Glori Bickers, MD  9:47 PM

## 2017-05-27 NOTE — Patient Instructions (Signed)
Lab today  We will contact you in 9 months to schedule your next appointment.

## 2017-06-01 ENCOUNTER — Other Ambulatory Visit: Payer: Self-pay

## 2017-06-01 ENCOUNTER — Ambulatory Visit: Payer: Medicare HMO | Attending: Orthopaedic Surgery

## 2017-06-01 DIAGNOSIS — G8929 Other chronic pain: Secondary | ICD-10-CM | POA: Diagnosis not present

## 2017-06-01 DIAGNOSIS — M6281 Muscle weakness (generalized): Secondary | ICD-10-CM | POA: Insufficient documentation

## 2017-06-01 DIAGNOSIS — M25551 Pain in right hip: Secondary | ICD-10-CM | POA: Diagnosis not present

## 2017-06-01 DIAGNOSIS — M25561 Pain in right knee: Secondary | ICD-10-CM | POA: Diagnosis not present

## 2017-06-01 DIAGNOSIS — R2689 Other abnormalities of gait and mobility: Secondary | ICD-10-CM | POA: Diagnosis not present

## 2017-06-01 DIAGNOSIS — R293 Abnormal posture: Secondary | ICD-10-CM | POA: Diagnosis not present

## 2017-06-01 NOTE — Patient Instructions (Signed)
Access Code: [email or web address]  URL: [email or web address]  Date: 06/01/2017  Prepared by: Sigurd Sos   Exercises  Seated Long Arc Quad - 10 reps - 1 sets - 5 hold - 3x daily - 7x weekly  Seated March - 10 reps - 3 sets - 3x daily - 7x weekly  Seated Heel Toe Raises - 10 reps - 2 sets - 3x daily - 7x weekly  Seated Heel Raise - 10 reps - 2 sets - 3x daily - 7x weekly  Seated Isometric Hip Adduction with Ball - 10 reps - 2 sets - 3x daily - 7x weekly  Seated Scapular Retraction - 10 reps - 5 hold - 3x daily - 7x weekly   [facility] [address], [place], [place] [address] Phone # [phone] Fax [phone]

## 2017-06-01 NOTE — Therapy (Signed)
[facility] [address] [place], [place], [address] Phone: [phone]   Fax:  [phone]  Physical Therapy Evaluation  Patient Details  Name: Alexandra [name] MRN: [id number] Date of Birth: 06/08/1927 Referring Provider: Jean Rosenthal, MD    Encounter Date: 06/01/2017  PT End of Session - 06/01/17 1520    Visit Number  1    Date for PT Re-Evaluation  07/27/17    Authorization Type  Medicare    PT Start Time  1433    PT Stop Time  1518    PT Time Calculation (min)  45 min    Activity Tolerance  Patient tolerated treatment well    Behavior During Therapy  [facility] for tasks assessed/performed       Past Medical History:  Diagnosis Date  . Adult hypothyroidism 04/02/2014  . Anemia of chronic disease 08/13/2014  . Anxiety   . Arthritis   . Asthma   . breast ca dx'd 2000   left  . CAP (community acquired pneumonia) 12/19/2012  . Chest pain    NM stress test 05/2011 Normal  . CHF (congestive heart failure) ([place]) 08/19/2014  . CKD (chronic kidney disease) stage 3, GFR 30-59 ml/min (HCC) 08/13/2014  . Depression   . DOE (dyspnea on exertion)    No SOB at rest  . Essential (primary) hypertension 04/02/2014  . Fatigue    excertional  . KDXIPJAS(505.3)   . Hereditary and idiopathic peripheral neuropathy 07/02/2014  . Hypercholesteremia   . Intermittent LBBB (left bundle branch block) 12/21/2012  . Leg weakness   . Neuropathy   . Obesity   . Osteopenia   . Peripheral neuropathy   . Presence of permanent cardiac pacemaker 01/16/2015   BIV  . Sacroiliitis, not elsewhere classified ([place])   . Situational depression   . Trigger finger    [name]    Past Surgical History:  Procedure Laterality Date  . ACHILLES TENDON SURGERY    . BREAST LUMPECTOMY     breast cancer  . CARDIAC CATHETERIZATION N/A 07/21/2015   Procedure: Right Heart Cath;  Surgeon: Jolaine Artist, MD;  Location: [place] [facility];   Service: Cardiovascular;  Laterality: N/A;  . CHOLECYSTECTOMY N/A 08/13/2014   Procedure: LAPAROSCOPIC CHOLECYSTECTOMY;  Surgeon: Stark Klein, MD;  Location: [facility];  Service: General;  Laterality: N/A;  . EP IMPLANTABLE DEVICE N/A 01/16/2015   Procedure: BiV Pacemaker Insertion CRT-P;  Surgeon: Deboraha Sprang, MD;  Location: [place] [facility];  Service: Cardiovascular;  Laterality: N/A;  . LEG SURGERY    . LUMBAR LAMINECTOMY    . POLYPECTOMY    . TONSILLECTOMY    . VESICOVAGINAL FISTULA CLOSURE W/ TAH     DC hysterectomy    There were no vitals filed for this visit.   Subjective Assessment - 06/01/17 1429    Subjective  Pt presents to PT with chronic Rt knee and hip pain.  Recent imaging showed significant Rt knee OA.  Pt had injection into Rt knee and Rt greater trochanter 05/18/17 and this has helped to reduce the pain.   Pt reports that she was in bed for 2015-2017 due to chronic heart condition. Pt has experienced weakness and endurance deficits since this time.       Pertinent History  Rt hip and knee injection: 05/18/17, pacemaker     Limitations  Walking;Standing    How long can you stand comfortably?  5-7 minutes  How long can you walk comfortably?  <10 minutes with use of cane    Diagnostic tests  x-ray: severe OA in the Rt knee    Patient Stated Goals  improve balance, walk without cane/walker    Currently in Pain?  Yes    Pain Score  5  2-5/10    Pain Location  Hip    Pain Orientation  Right    Pain Descriptors / Indicators  Aching;Sore;Tightness    Pain Type  Chronic pain    Pain Radiating Towards  Rt knee    Pain Onset  More than a month ago    Pain Frequency  Constant    Aggravating Factors   standing, walking    Pain Relieving Factors  sitting, Advil         [facility] PT Assessment - 06/01/17 0001      Assessment   Medical Diagnosis  pain in Rt hip    Referring Provider  Jean Rosenthal, MD     Onset Date/Surgical Date  06/02/15    Next MD Visit  07/04/17     Prior Therapy  none      Precautions   Precautions  Fall;ICD/Pacemaker;Other (comment) history of cancer      Restrictions   Weight Bearing Restrictions  No      Balance Screen   Has the patient fallen in the past 6 months  Yes    How many times?  1 wind knocked pt over- will address balance    Has the patient had a decrease in activity level because of a fear of falling?   No    Is the patient reluctant to leave their home because of a fear of falling?   No      Home Environment   Living Environment  Private residence    Living Arrangements  Alone    Type of [place] to enter    Entrance Stairs-Number of Steps  2    [place]  One level    [place] - 2 wheels;Cane - quad;Grab bars - toilet;Grab bars - tub/shower      Prior Function   Level of Independence  Independent    Vocation  Retired    Leisure  chair yoga 1x/wk      Cognition   Overall Cognitive Status  Within Functional Limits for tasks assessed      Observation/Other Assessments   Focus on Therapeutic Outcomes (FOTO)   51% limitation      Posture/Postural Control   Posture/Postural Control  Postural limitations    Postural Limitations  Forward head;Flexed trunk      ROM / Strength   AROM / PROM / Strength  AROM;PROM;Strength      AROM   Overall AROM   Within functional limits for tasks performed    Overall AROM Comments  hip and knee A/ROM is WFLs for age      PROM   Overall PROM   Within functional limits for tasks performed      Strength   Overall Strength  Deficits    Overall Strength Comments  Hip, knee and ankle strength 4/5 to 4+/5 bilaterally      Palpation   Palpation comment  crepitus of Rt patella and noted with open chain knee flexion and extension.  Palpable tenderness over Rt lateral distal hamstring insertion.  Mild palpable tenderness over Rt trochanteric bursa.  Transfers   Transfers  Stand to Sit;Sit to Stand    Sit to Stand  With  upper extremity assist    Five time sit to stand comments   18.2 seconds    Stand to Sit  With upper extremity assist      Ambulation/Gait   Ambulation/Gait  Yes    Assistive device  Large base quad cane    Gait Pattern  Decreased stance time - right;Decreased step length - right;Decreased step length - left;Decreased trunk rotation    Ambulation Surface  Level                Objective measurements completed on examination: See above findings.              PT Education - 06/01/17 1510    Education provided  Yes    Education Details  access code: [email or web address]   seated LE strength and scapular squeezes    Person(s) Educated  Patient    Methods  Explanation;Demonstration;Handout    Comprehension  Verbalized understanding;Returned demonstration       PT Short Term Goals - 06/01/17 1448      PT SHORT TERM GOAL #1   Title  be independent in initial HEP    Time  4    Period  Weeks    Status  New    Target Date  06/29/17      PT SHORT TERM GOAL #2   Title  perform 5x sit to stand in < or = to 16 seconds to improve balance    Time  4    Period  Weeks    Status  New    Target Date  06/29/17      PT SHORT TERM GOAL #3   Title  report < or = to 5/10 Rt hip pain with standing and walking    Time  4    Period  Weeks    Status  New    Target Date  06/29/17        PT Long Term Goals - 06/01/17 1527      PT LONG TERM GOAL #1   Title  be independent in advanced HEP    Time  8    Period  Weeks    Status  New    Target Date  07/27/17      PT LONG TERM GOAL #2   Title  reduce FOTO to < or = to 42% limitation    Time  8    Period  Weeks    Status  New    Target Date  07/27/17      PT LONG TERM GOAL #3   Title  perform 5x sit to stand in < or = to 14 seconds to improve balance    Time  8    Period  Weeks    Status  New    Target Date  07/27/17      PT LONG TERM GOAL #4   Title  report a 50% reduction in Rt LE pain with standing and walking    Time  8     Period  Weeks    Status  New    Target Date  07/27/17      PT LONG TERM GOAL #5   Title  demonstrate upright posture with gait to improve balance    Time  8    Period  Weeks    Status  New    Target  Date  07/27/17             Plan - 06/01/17 1521    Clinical Impression Statement  Pt presents to PT with chronic Rt LE pain and weakness.  Pt reports that she spent ~2 years in bed after he husband passed away due to a cardiac condition.  Pt reports instability, reduced endurance/weakness and limited ability to stand and walk due to both pain and weakness.  Pt demonstrates flexed trunk and forward head posture, LE strength deficits, gait abnormality and up to 7/10 Rt LE pain with activity.  Pt will benefit from skilled PT for LE strength, flexibility, gait/balance and postural strength progression.      History and Personal Factors relevant to plan of care:  pacemaker, chronic balance and strength deficits    Clinical Presentation  Stable    Clinical Presentation due to:  chronic condition, family support    Clinical Decision Making  Low    Rehab Potential  Good    PT Frequency  1x / week    PT Duration  8 weeks    PT Treatment/Interventions  ADLs/Self Care Home Management;Moist Heat;Cryotherapy;Gait training;Stair training;Functional mobility training;Therapeutic activities;Therapeutic exercise;Balance training;Patient/family education;Neuromuscular re-education;Manual techniques;Passive range of motion;Taping    PT Next Visit Plan  balance exercises, LE strength, endurance, pain management as needed    PT Home Exercise Plan  access code:  [email or web address]     Consulted and Agree with Plan of Care  Patient       Patient will benefit from skilled therapeutic intervention in order to improve the following deficits and impairments:  Abnormal gait, Improper body mechanics, Pain, Postural dysfunction, Decreased strength, Decreased range of motion, Decreased activity tolerance, Difficulty  walking, Decreased balance  Visit Diagnosis: Pain in right hip - Plan: PT plan of care cert/re-cert  Chronic pain of right knee - Plan: PT plan of care cert/re-cert  Abnormal posture - Plan: PT plan of care cert/re-cert  Other abnormalities of gait and mobility - Plan: PT plan of care cert/re-cert  Muscle weakness (generalized) - Plan: PT plan of care cert/re-cert     Problem List Patient Active Problem List   Diagnosis Date Noted  . Dyspnea   . Chronic systolic heart failure ([place])   . Pain in left shin 02/07/2015  . Leukocytosis 01/31/2015  . Chronic systolic CHF (congestive heart failure), NYHA class 3 ([place]) 01/16/2015  . Protein-calorie malnutrition, severe ([place]) 01/15/2015  . Infection due to Corynebacterium diphtheriae 12/24/2014  . Bacteremia   . Fever 12/19/2014  . Chronic systolic CHF (congestive heart failure), NYHA class 4 ([place]) 10/23/2014  . Situational depression   . Frequent headaches   . Pulmonary embolism ([place]) 10/10/2014  . Chronic anticoagulation 10/10/2014  . Takotsubo syndrome 08/19/2014  . HCAP (healthcare-associated pneumonia) 08/17/2014  . H/O Asthma 08/16/2014  . LBBB (left bundle branch block) 08/16/2014  . Cardiomyopathy- etiology not yet determined 08/16/2014  . PSVT (paroxysmal supraventricular tachycardia) ([place]) 08/16/2014  . CKD (chronic kidney disease) stage 3, GFR 30-59 ml/min (HCC) 08/13/2014  . Anemia of chronic disease 08/13/2014  . Hypothyroidism 08/13/2014  . Acute cholecystitis 08/11/2014  . Abdominal pain 08/11/2014  . Essential (primary) hypertension 04/02/2014     Sigurd Sos, PT 06/01/17 3:32 PM  [facility] [address] [place], [place], [address] Phone: [phone]   Fax:  [phone]  Name: Alexandra [name] MRN: [id number] Date of Birth: 06-Nov-1927

## 2017-06-06 ENCOUNTER — Ambulatory Visit: Payer: Medicare HMO

## 2017-06-06 DIAGNOSIS — M25561 Pain in right knee: Secondary | ICD-10-CM | POA: Diagnosis not present

## 2017-06-06 DIAGNOSIS — M6281 Muscle weakness (generalized): Secondary | ICD-10-CM | POA: Diagnosis not present

## 2017-06-06 DIAGNOSIS — G8929 Other chronic pain: Secondary | ICD-10-CM | POA: Diagnosis not present

## 2017-06-06 DIAGNOSIS — M25551 Pain in right hip: Secondary | ICD-10-CM

## 2017-06-06 DIAGNOSIS — R293 Abnormal posture: Secondary | ICD-10-CM | POA: Diagnosis not present

## 2017-06-06 DIAGNOSIS — R2689 Other abnormalities of gait and mobility: Secondary | ICD-10-CM

## 2017-06-06 NOTE — Therapy (Signed)
[facility] [address], [place], [place], [address] Phone: [phone]   Fax:  [phone]  Physical Therapy Treatment  Patient Details  Name: Alexandra [name] MRN: [id number] Date of Birth: 05/21/1927 Referring Provider: Jean Rosenthal, MD    Encounter Date: 06/06/2017  PT End of Session - 06/06/17 1440    Visit Number  2    Date for PT Re-Evaluation  07/27/17    Authorization Type  Medicare    PT Start Time  6962    PT Stop Time  1440    PT Time Calculation (min)  42 min    Activity Tolerance  Patient tolerated treatment well    Behavior During Therapy  [facility] for tasks assessed/performed       Past Medical History:  Diagnosis Date  . Adult hypothyroidism 04/02/2014  . Anemia of chronic disease 08/13/2014  . Anxiety   . Arthritis   . Asthma   . breast ca dx'd 2000   left  . CAP (community acquired pneumonia) 12/19/2012  . Chest pain    NM stress test 05/2011 Normal  . CHF (congestive heart failure) ([place]) 08/19/2014  . CKD (chronic kidney disease) stage 3, GFR 30-59 ml/min (HCC) 08/13/2014  . Depression   . DOE (dyspnea on exertion)    No SOB at rest  . Essential (primary) hypertension 04/02/2014  . Fatigue    excertional  . XBMWUXLK(440.1)   . Hereditary and idiopathic peripheral neuropathy 07/02/2014  . Hypercholesteremia   . Intermittent LBBB (left bundle branch block) 12/21/2012  . Leg weakness   . Neuropathy   . Obesity   . Osteopenia   . Peripheral neuropathy   . Presence of permanent cardiac pacemaker 01/16/2015   BIV  . Sacroiliitis, not elsewhere classified ([place])   . Situational depression   . Trigger finger    [name]    Past Surgical History:  Procedure Laterality Date  . ACHILLES TENDON SURGERY    . BREAST LUMPECTOMY     breast cancer  . CARDIAC CATHETERIZATION N/A 07/21/2015   Procedure: Right Heart Cath;  Surgeon: Jolaine Artist, MD;  Location: [place] [facility];   Service: Cardiovascular;  Laterality: N/A;  . CHOLECYSTECTOMY N/A 08/13/2014   Procedure: LAPAROSCOPIC CHOLECYSTECTOMY;  Surgeon: Stark Klein, MD;  Location: [facility];  Service: General;  Laterality: N/A;  . EP IMPLANTABLE DEVICE N/A 01/16/2015   Procedure: BiV Pacemaker Insertion CRT-P;  Surgeon: Deboraha Sprang, MD;  Location: [place] [facility];  Service: Cardiovascular;  Laterality: N/A;  . LEG SURGERY    . LUMBAR LAMINECTOMY    . POLYPECTOMY    . TONSILLECTOMY    . VESICOVAGINAL FISTULA CLOSURE W/ TAH     DC hysterectomy    There were no vitals filed for this visit.  Subjective Assessment - 06/06/17 1402    Subjective  I did my exercises over the weekend.      Currently in Pain?  Yes    Pain Score  4     Pain Location  Hip    Pain Orientation  Right    Pain Descriptors / Indicators  Aching;Sore;Tightness    Pain Type  Chronic pain    Pain Onset  More than a month ago    Pain Frequency  Constant    Aggravating Factors   standing and walking    Pain Relieving Factors  sitting, Advil  [facility] Adult PT Treatment/Exercise - 06/06/17 0001      Exercises   Exercises  Knee/Hip;Lumbar      Lumbar Exercises: Aerobic   Nustep  Level 1x 6 minutes  PT present to discuss progress      Lumbar Exercises: Standing   Heel Raises  20 reps      Lumbar Exercises: Seated   Long Arc Quad on Chair  Strengthening;Both;2 sets;10 reps    Other Seated Lumbar Exercises  row: red band 2x10      Knee/Hip Exercises: Standing   Heel Raises  Both;2 sets;10 reps    Hip Extension  Stengthening;Both;2 sets;10 reps      Knee/Hip Exercises: Seated   Ball Squeeze  x20    Marching  Strengthening;Both;2 sets;10 reps    Hamstring Curl  Strengthening;Both;2 sets;10 reps yellow theraband    Sit to Sand  5 reps;without UE support             PT Education - 06/06/17 1437    Education provided  Yes    Education Details  access code: [email or web address]       PT Short  Term Goals - 06/01/17 1448      PT SHORT TERM GOAL #1   Title  be independent in initial HEP    Time  4    Period  Weeks    Status  New    Target Date  06/29/17      PT SHORT TERM GOAL #2   Title  perform 5x sit to stand in < or = to 16 seconds to improve balance    Time  4    Period  Weeks    Status  New    Target Date  06/29/17      PT SHORT TERM GOAL #3   Title  report < or = to 5/10 Rt hip pain with standing and walking    Time  4    Period  Weeks    Status  New    Target Date  06/29/17        PT Long Term Goals - 06/01/17 1527      PT LONG TERM GOAL #1   Title  be independent in advanced HEP    Time  8    Period  Weeks    Status  New    Target Date  07/27/17      PT LONG TERM GOAL #2   Title  reduce FOTO to < or = to 42% limitation    Time  8    Period  Weeks    Status  New    Target Date  07/27/17      PT LONG TERM GOAL #3   Title  perform 5x sit to stand in < or = to 14 seconds to improve balance    Time  8    Period  Weeks    Status  New    Target Date  07/27/17      PT LONG TERM GOAL #4   Title  report a 50% reduction in Rt LE pain with standing and walking    Time  8    Period  Weeks    Status  New    Target Date  07/27/17      PT LONG TERM GOAL #5   Title  demonstrate upright posture with gait to improve balance    Time  8    Period  Weeks  Status  New    Target Date  07/27/17            Plan - 06/06/17 1425    Clinical Impression Statement  Pt with 1st session after evalution.  Pt was able to demonstrate all initial HEP correctly today.  Pt participated in low level exercises today without difficulty and reported fatigue at the end of the session.  Pt is working on postural corrections at home to improve balance.  Pt with chronic endurance and strength deficits and Rt hip and knee pain.  Pt will continue to benefit from skilled PT for strength, gait and pain management as needed.      Clinical Decision Making  Low    Rehab  Potential  Good    PT Frequency  1x / week    PT Duration  8 weeks    PT Treatment/Interventions  ADLs/Self Care Home Management;Moist Heat;Cryotherapy;Gait training;Stair training;Functional mobility training;Therapeutic activities;Therapeutic exercise;Balance training;Patient/family education;Neuromuscular re-education;Manual techniques;Passive range of motion;Taping    PT Next Visit Plan  balance exercises, LE strength, endurance, pain management as needed    PT Home Exercise Plan  access code:  [email or web address]     Recommended Other Services  initial certification is signed    Consulted and Agree with Plan of Care  Patient       Patient will benefit from skilled therapeutic intervention in order to improve the following deficits and impairments:  Abnormal gait, Improper body mechanics, Pain, Postural dysfunction, Decreased strength, Decreased range of motion, Decreased activity tolerance, Difficulty walking, Decreased balance  Visit Diagnosis: Pain in right hip  Chronic pain of right knee  Other abnormalities of gait and mobility  Muscle weakness (generalized)  Abnormal posture     Problem List Patient Active Problem List   Diagnosis Date Noted  . Dyspnea   . Chronic systolic heart failure ([place])   . Pain in left shin 02/07/2015  . Leukocytosis 01/31/2015  . Chronic systolic CHF (congestive heart failure), NYHA class 3 ([place]) 01/16/2015  . Protein-calorie malnutrition, severe ([place]) 01/15/2015  . Infection due to Corynebacterium diphtheriae 12/24/2014  . Bacteremia   . Fever 12/19/2014  . Chronic systolic CHF (congestive heart failure), NYHA class 4 ([place]) 10/23/2014  . Situational depression   . Frequent headaches   . Pulmonary embolism ([place]) 10/10/2014  . Chronic anticoagulation 10/10/2014  . Takotsubo syndrome 08/19/2014  . HCAP (healthcare-associated pneumonia) 08/17/2014  . H/O Asthma 08/16/2014  . LBBB (left bundle branch block) 08/16/2014  . Cardiomyopathy- etiology not  yet determined 08/16/2014  . PSVT (paroxysmal supraventricular tachycardia) ([place]) 08/16/2014  . CKD (chronic kidney disease) stage 3, GFR 30-59 ml/min (HCC) 08/13/2014  . Anemia of chronic disease 08/13/2014  . Hypothyroidism 08/13/2014  . Acute cholecystitis 08/11/2014  . Abdominal pain 08/11/2014  . Essential (primary) hypertension 04/02/2014     Sigurd Sos, PT 06/06/17 2:43 PM  [facility] [address] [place], [place], [address] Phone: [phone]   Fax:  [phone]  Name: FIDELIA CATHERS MRN: [id number] Date of Birth: 07/11/1927

## 2017-06-06 NOTE — Patient Instructions (Signed)
Standing Heel Raise with Support - 10 reps - 2 sets - 2x daily - 7x weekly  Standing Hip Extension - 10 reps - 2 sets - 2x daily - 7x weekly  Sit to Stand - 5 reps - 1 sets - 2x daily - 7x weekly

## 2017-06-09 ENCOUNTER — Ambulatory Visit (INDEPENDENT_AMBULATORY_CARE_PROVIDER_SITE_OTHER): Payer: Medicare HMO | Admitting: *Deleted

## 2017-06-09 DIAGNOSIS — I428 Other cardiomyopathies: Secondary | ICD-10-CM

## 2017-06-09 DIAGNOSIS — Z950 Presence of cardiac pacemaker: Secondary | ICD-10-CM | POA: Diagnosis not present

## 2017-06-09 DIAGNOSIS — I5022 Chronic systolic (congestive) heart failure: Secondary | ICD-10-CM | POA: Diagnosis not present

## 2017-06-09 NOTE — Progress Notes (Signed)
Remote pacemaker transmission.   

## 2017-06-09 NOTE — Progress Notes (Signed)
EPIC Encounter for ICM Monitoring  Patient Name: Alexandra [name] is a 84 y.o. female Date: 06/09/2017 Primary Care Physican: Lawerance Cruel, MD Primary Cardiologist:Skains/Bensimhon Electrophysiologist: Caryl Comes Dry Weight:160lbs Bi-V Pacing: 99%      Heart Failure questions reviewed, pt asymptomatic.   Thoracic impedance normal.  Prescribed dosage: Furosemide 40 mg 1 tablet every other day. Potassium 10 mEq 1 tablet daily.  LABS: 05/27/2017 Creatinine 1.41, BUN 24, Potassium 4.8, Sodium 139, EGFR 32-38 01/27/2016 Creatinine 1.50, BUN 40, Potassium 3.8, Sodium 141, EGFR 31-36 07/21/2015 Creatinine 1.55, BUN 29, Potassium 3.8, Sodium 139 07/11/2015 Creatinine 1.65, BUN 27, Potassium 4.0, Sodium 138 06/23/2015 Creatinine 1.61, BUN 34, Potassium 4.1, Sodium 139  Recommendations: No changes.   Encouraged to call for fluid symptoms.  Follow-up plan: ICM clinic phone appointment on 07/11/2017.    Copy of ICM check sent to Dr. Caryl Comes.   3 month ICM trend: 06/09/2017    1 Year ICM trend:       [name], RN 06/09/2017 10:04 AM

## 2017-06-20 ENCOUNTER — Ambulatory Visit: Payer: Medicare HMO | Attending: Orthopaedic Surgery

## 2017-06-20 DIAGNOSIS — M6281 Muscle weakness (generalized): Secondary | ICD-10-CM | POA: Diagnosis not present

## 2017-06-20 DIAGNOSIS — R2689 Other abnormalities of gait and mobility: Secondary | ICD-10-CM

## 2017-06-20 DIAGNOSIS — M25561 Pain in right knee: Secondary | ICD-10-CM | POA: Diagnosis not present

## 2017-06-20 DIAGNOSIS — R293 Abnormal posture: Secondary | ICD-10-CM | POA: Insufficient documentation

## 2017-06-20 DIAGNOSIS — G8929 Other chronic pain: Secondary | ICD-10-CM | POA: Insufficient documentation

## 2017-06-20 DIAGNOSIS — M25551 Pain in right hip: Secondary | ICD-10-CM | POA: Diagnosis not present

## 2017-06-20 DIAGNOSIS — R6889 Other general symptoms and signs: Secondary | ICD-10-CM | POA: Diagnosis not present

## 2017-06-20 NOTE — Therapy (Signed)
[facility] [address] [place], [place], [address] Phone: [phone]   Fax:  [phone]  Physical Therapy Treatment  Patient Details  Name: Alexandra [name] MRN: [id number] Date of Birth: November 05, 1927 Referring Provider: Jean Rosenthal, MD    Encounter Date: 06/20/2017  PT End of Session - 06/20/17 1350    Visit Number  3    Date for PT Re-Evaluation  07/27/17    Authorization Type  Medicare    PT Start Time  1146    PT Stop Time  1227    PT Time Calculation (min)  41 min    Activity Tolerance  Patient tolerated treatment well    Behavior During Therapy  [facility] for tasks assessed/performed       Past Medical History:  Diagnosis Date  . Adult hypothyroidism 04/02/2014  . Anemia of chronic disease 08/13/2014  . Anxiety   . Arthritis   . Asthma   . breast ca dx'd 2000   left  . CAP (community acquired pneumonia) 12/19/2012  . Chest pain    NM stress test 05/2011 Normal  . CHF (congestive heart failure) ([place]) 08/19/2014  . CKD (chronic kidney disease) stage 3, GFR 30-59 ml/min (HCC) 08/13/2014  . Depression   . DOE (dyspnea on exertion)    No SOB at rest  . Essential (primary) hypertension 04/02/2014  . Fatigue    excertional  . JOACZYSA(630.1)   . Hereditary and idiopathic peripheral neuropathy 07/02/2014  . Hypercholesteremia   . Intermittent LBBB (left bundle branch block) 12/21/2012  . Leg weakness   . Neuropathy   . Obesity   . Osteopenia   . Peripheral neuropathy   . Presence of permanent cardiac pacemaker 01/16/2015   BIV  . Sacroiliitis, not elsewhere classified ([place])   . Situational depression   . Trigger finger    [name]    Past Surgical History:  Procedure Laterality Date  . ACHILLES TENDON SURGERY    . BREAST LUMPECTOMY     breast cancer  . CARDIAC CATHETERIZATION N/A 07/21/2015   Procedure: Right Heart Cath;  Surgeon: Jolaine Artist, MD;  Location: [place] [facility];   Service: Cardiovascular;  Laterality: N/A;  . CHOLECYSTECTOMY N/A 08/13/2014   Procedure: LAPAROSCOPIC CHOLECYSTECTOMY;  Surgeon: Stark Klein, MD;  Location: [facility];  Service: General;  Laterality: N/A;  . EP IMPLANTABLE DEVICE N/A 01/16/2015   Procedure: BiV Pacemaker Insertion CRT-P;  Surgeon: Deboraha Sprang, MD;  Location: [place] [facility];  Service: Cardiovascular;  Laterality: N/A;  . LEG SURGERY    . LUMBAR LAMINECTOMY    . POLYPECTOMY    . TONSILLECTOMY    . VESICOVAGINAL FISTULA CLOSURE W/ TAH     DC hysterectomy    There were no vitals filed for this visit.  Subjective Assessment - 06/20/17 1151    Subjective  I have been doing my exercises and have had a little bit of soreness.  It isn't bad though.      Currently in Pain?  No/denies                       [facility] Adult PT Treatment/Exercise - 06/20/17 0001      Lumbar Exercises: Aerobic   Nustep  Level 1x 8 minutes  PT present to discuss progress      Lumbar Exercises: Standing   Heel Raises  20 reps      Lumbar Exercises: Seated  Long [employer] on Chair  Strengthening;Both;2 sets;10 reps;Weights    LAQ on [employer] (lbs)  2    Other Seated Lumbar Exercises  row: green band 2x10      Knee/Hip Exercises: Standing   Heel Raises  Both;2 sets;10 reps    Hip Abduction  Stengthening;Both;2 sets;10 reps    Hip Extension  Stengthening;Both;2 sets;10 reps      Knee/Hip Exercises: Seated   Ball Squeeze  x20    Marching  Strengthening;Both;2 sets;10 reps 2#    Sit to [employer]  5 reps;without UE support               PT Short Term Goals - 06/01/17 1448      PT SHORT TERM GOAL #1   Title  be independent in initial HEP    Time  4    Period  Weeks    Status  New    Target Date  06/29/17      PT SHORT TERM GOAL #2   Title  perform 5x sit to stand in < or = to 16 seconds to improve balance    Time  4    Period  Weeks    Status  New    Target Date  06/29/17      PT SHORT TERM GOAL #3    Title  report < or = to 5/10 Rt hip pain with standing and walking    Time  4    Period  Weeks    Status  New    Target Date  06/29/17        PT Long Term Goals - 06/01/17 1527      PT LONG TERM GOAL #1   Title  be independent in advanced HEP    Time  8    Period  Weeks    Status  New    Target Date  07/27/17      PT LONG TERM GOAL #2   Title  reduce FOTO to < or = to 42% limitation    Time  8    Period  Weeks    Status  New    Target Date  07/27/17      PT LONG TERM GOAL #3   Title  perform 5x sit to stand in < or = to 14 seconds to improve balance    Time  8    Period  Weeks    Status  New    Target Date  07/27/17      PT LONG TERM GOAL #4   Title  report a 50% reduction in Rt LE pain with standing and walking    Time  8    Period  Weeks    Status  New    Target Date  07/27/17      PT LONG TERM GOAL #5   Title  demonstrate upright posture with gait to improve balance    Time  8    Period  Weeks    Status  New    Target Date  07/27/17            Plan - 06/20/17 1227    Clinical Impression Statement  Pt has been compliant with HEP.  Pt with improved control with sit to stand today and improved ability to perform without UE support.  Pt tolerated2# ankle weights today with strength.  Pt with chronic endurance and strength deficits and Rt hip and knee pain and will continue  to benefit from skilled PT for strength, gait, and pain management as needed.      Rehab Potential  Good    PT Frequency  1x / week    PT Duration  8 weeks    PT Treatment/Interventions  ADLs/Self Care Home Management;Moist Heat;Cryotherapy;Gait training;Stair training;Functional mobility training;Therapeutic activities;Therapeutic exercise;Balance training;Patient/family education;Neuromuscular re-education;Manual techniques;Passive range of motion;Taping    PT Next Visit Plan  balance exercises, LE strength, endurance, pain management as needed    Consulted and Agree with Plan of Care   Patient       Patient will benefit from skilled therapeutic intervention in order to improve the following deficits and impairments:  Abnormal gait, Improper body mechanics, Pain, Postural dysfunction, Decreased strength, Decreased range of motion, Decreased activity tolerance, Difficulty walking, Decreased balance  Visit Diagnosis: Pain in right hip  Chronic pain of right knee  Other abnormalities of gait and mobility  Muscle weakness (generalized)  Abnormal posture     Problem List Patient Active Problem List   Diagnosis Date Noted  . Dyspnea   . Chronic systolic heart failure ([place])   . Pain in left shin 02/07/2015  . Leukocytosis 01/31/2015  . Chronic systolic CHF (congestive heart failure), NYHA class 3 ([place]) 01/16/2015  . Protein-calorie malnutrition, severe ([place]) 01/15/2015  . Infection due to Corynebacterium diphtheriae 12/24/2014  . Bacteremia   . Fever 12/19/2014  . Chronic systolic CHF (congestive heart failure), NYHA class 4 ([place]) 10/23/2014  . Situational depression   . Frequent headaches   . Pulmonary embolism ([place]) 10/10/2014  . Chronic anticoagulation 10/10/2014  . Takotsubo syndrome 08/19/2014  . HCAP (healthcare-associated pneumonia) 08/17/2014  . H/O Asthma 08/16/2014  . LBBB (left bundle branch block) 08/16/2014  . Cardiomyopathy- etiology not yet determined 08/16/2014  . PSVT (paroxysmal supraventricular tachycardia) ([place]) 08/16/2014  . CKD (chronic kidney disease) stage 3, GFR 30-59 ml/min (HCC) 08/13/2014  . Anemia of chronic disease 08/13/2014  . Hypothyroidism 08/13/2014  . Acute cholecystitis 08/11/2014  . Abdominal pain 08/11/2014  . Essential (primary) hypertension 04/02/2014    Sigurd Sos, PT 06/20/17 1:53 PM  [facility] [address] [place], [place], [address] Phone: [phone]   Fax:  [phone]  Name: Alexandra [name] MRN: [id number] Date of Birth:  Aug 28, 1927

## 2017-06-27 LAB — CUP PACEART REMOTE DEVICE CHECK
Battery Remaining Longevity: 64 mo
Battery Voltage: 2.96 V
Brady Statistic AP VP Percent: 1 %
Brady Statistic AS VS Percent: 1.4 %
Brady Statistic RA Percent Paced: 1 %
Implantable Lead Implant Date: 20161229
Implantable Lead Location: 753858
Implantable Lead Location: 753859
Lead Channel Impedance Value: 790 Ohm
Lead Channel Pacing Threshold Amplitude: 0.875 V
Lead Channel Pacing Threshold Amplitude: 2.125 V
Lead Channel Pacing Threshold Pulse Width: 0.5 ms
Lead Channel Pacing Threshold Pulse Width: 1 ms
Lead Channel Setting Pacing Amplitude: 2.875
Lead Channel Setting Sensing Sensitivity: 2 mV
MDC IDC LEAD IMPLANT DT: 20161229
MDC IDC LEAD IMPLANT DT: 20161229
MDC IDC LEAD LOCATION: 753860
MDC IDC MSMT BATTERY REMAINING PERCENTAGE: 95.5 %
MDC IDC MSMT LEADCHNL RA IMPEDANCE VALUE: 440 Ohm
MDC IDC MSMT LEADCHNL RA PACING THRESHOLD AMPLITUDE: 0.75 V
MDC IDC MSMT LEADCHNL RA SENSING INTR AMPL: 2.8 mV
MDC IDC MSMT LEADCHNL RV IMPEDANCE VALUE: 510 Ohm
MDC IDC MSMT LEADCHNL RV PACING THRESHOLD PULSEWIDTH: 0.5 ms
MDC IDC MSMT LEADCHNL RV SENSING INTR AMPL: 12 mV
MDC IDC PG IMPLANT DT: 20161229
MDC IDC SESS DTM: 20190523064428
MDC IDC SET LEADCHNL LV PACING PULSEWIDTH: 1 ms
MDC IDC SET LEADCHNL RA PACING AMPLITUDE: 1.75 V
MDC IDC SET LEADCHNL RV PACING AMPLITUDE: 2 V
MDC IDC SET LEADCHNL RV PACING PULSEWIDTH: 0.5 ms
MDC IDC STAT BRADY AP VS PERCENT: 1 %
MDC IDC STAT BRADY AS VP PERCENT: 98 %
Pulse Gen Model: 3262
Pulse Gen Serial Number: 7834528

## 2017-06-29 ENCOUNTER — Ambulatory Visit: Payer: Medicare HMO

## 2017-06-29 DIAGNOSIS — R2689 Other abnormalities of gait and mobility: Secondary | ICD-10-CM

## 2017-06-29 DIAGNOSIS — G8929 Other chronic pain: Secondary | ICD-10-CM | POA: Diagnosis not present

## 2017-06-29 DIAGNOSIS — R293 Abnormal posture: Secondary | ICD-10-CM | POA: Diagnosis not present

## 2017-06-29 DIAGNOSIS — M25551 Pain in right hip: Secondary | ICD-10-CM | POA: Diagnosis not present

## 2017-06-29 DIAGNOSIS — M6281 Muscle weakness (generalized): Secondary | ICD-10-CM

## 2017-06-29 DIAGNOSIS — M25561 Pain in right knee: Secondary | ICD-10-CM

## 2017-06-29 NOTE — Therapy (Addendum)
[facility] [address], [place], [place], [address] Phone: [phone]   Fax:  [phone]  Physical Therapy Treatment  Patient Details  Name: Alexandra [name] MRN: [id number] Date of Birth: 1927-01-29 Referring Provider: Jean Rosenthal, MD    Encounter Date: 06/29/2017  PT End of Session - 06/29/17 1310    Visit Number  4    Date for PT Re-Evaluation  07/27/17    Authorization Type  Medicare    PT Start Time  1230    PT Stop Time  1310    PT Time Calculation (min)  40 min    Activity Tolerance  Patient tolerated treatment well    Behavior During Therapy  [facility] for tasks assessed/performed       Past Medical History:  Diagnosis Date  . Adult hypothyroidism 04/02/2014  . Anemia of chronic disease 08/13/2014  . Anxiety   . Arthritis   . Asthma   . breast ca dx'd 2000   left  . CAP (community acquired pneumonia) 12/19/2012  . Chest pain    NM stress test 05/2011 Normal  . CHF (congestive heart failure) ([place]) 08/19/2014  . CKD (chronic kidney disease) stage 3, GFR 30-59 ml/min (HCC) 08/13/2014  . Depression   . DOE (dyspnea on exertion)    No SOB at rest  . Essential (primary) hypertension 04/02/2014  . Fatigue    excertional  . RAQTMAUQ(333.5)   . Hereditary and idiopathic peripheral neuropathy 07/02/2014  . Hypercholesteremia   . Intermittent LBBB (left bundle branch block) 12/21/2012  . Leg weakness   . Neuropathy   . Obesity   . Osteopenia   . Peripheral neuropathy   . Presence of permanent cardiac pacemaker 01/16/2015   BIV  . Sacroiliitis, not elsewhere classified ([place])   . Situational depression   . Trigger finger    [name]    Past Surgical History:  Procedure Laterality Date  . ACHILLES TENDON SURGERY    . BREAST LUMPECTOMY     breast cancer  . CARDIAC CATHETERIZATION N/A 07/21/2015   Procedure: Right Heart Cath;  Surgeon: Jolaine Artist, MD;  Location: [place] [facility];   Service: Cardiovascular;  Laterality: N/A;  . CHOLECYSTECTOMY N/A 08/13/2014   Procedure: LAPAROSCOPIC CHOLECYSTECTOMY;  Surgeon: Stark Klein, MD;  [facility];  Service: General;  Laterality: N/A;  . EP IMPLANTABLE DEVICE N/A 01/16/2015   Procedure: BiV Pacemaker Insertion CRT-P;  Surgeon: Deboraha Sprang, MD;  Location: [place] [facility];  Service: Cardiovascular;  Laterality: N/A;  . LEG SURGERY    . LUMBAR LAMINECTOMY    . POLYPECTOMY    . TONSILLECTOMY    . VESICOVAGINAL FISTULA CLOSURE W/ TAH     DC hysterectomy    There were no vitals filed for this visit.  Subjective Assessment - 06/29/17 1230    Subjective  I have been busy.  I've been doing my exercises.      Pertinent History  Rt hip and knee injection: 05/18/17, pacemaker     Currently in Pain?  Yes    Pain Score  3     Pain Location  Knee    Pain Orientation  Right    Pain Descriptors / Indicators  Aching;Sore    Pain Type  Chronic pain    Pain Onset  More than a month ago    Pain Frequency  Constant    Aggravating Factors   standing and walking  Pain Relieving Factors  sitting, Advil                        [facility] Adult PT Treatment/Exercise - 06/29/17 0001      Lumbar Exercises: Aerobic   Nustep  Level 2 x 8 minutes  PT present to discuss progress      Lumbar Exercises: Standing   Heel Raises  20 reps      Lumbar Exercises: Seated   Long Arc Quad on Chair  Strengthening;Both;2 sets;10 reps;Weights    LAQ on Chair Weights (lbs)  2    Other Seated Lumbar Exercises  row: green band 2x10      Knee/Hip Exercises: Standing   Heel Raises  Both;2 sets;10 reps    Hip Abduction  Stengthening;Both;2 sets;10 reps    Hip Extension  Stengthening;Both;2 sets;10 reps    Rocker Board  3 minutes    Other Standing Knee Exercises  static standing on black balance pad: 2x 1 minute close SBA by PT for safety      Knee/Hip Exercises: Seated   Ball Squeeze  x20    Marching  Strengthening;Both;2 sets;10  reps 2#    Hamstring Curl  Strengthening;Both;2 sets;10 reps yellow theraband    Sit to Sand  5 reps;without UE support               PT Short Term Goals - 06/29/17 1236      PT SHORT TERM GOAL #1   Title  be independent in initial HEP    Status  Achieved      PT SHORT TERM GOAL #2   Title  perform 5x sit to stand in < or = to 16 seconds to improve balance    Baseline  19 seconds with use of hands and instability upon standing    Time  4    Period  Weeks    Status  On-going      PT SHORT TERM GOAL #3   Title  report < or = to 5/10 Rt hip pain with standing and walking    Baseline  no hip pain, now with knee pain    Status  Achieved        PT Long Term Goals - 06/01/17 1527      PT LONG TERM GOAL #1   Title  be independent in advanced HEP    Time  8    Period  Weeks    Status  New    Target Date  07/27/17      PT LONG TERM GOAL #2   Title  reduce FOTO to < or = to 42% limitation    Time  8    Period  Weeks    Status  New    Target Date  07/27/17      PT LONG TERM GOAL #3   Title  perform 5x sit to stand in < or = to 14 seconds to improve balance    Time  8    Period  Weeks    Status  New    Target Date  07/27/17      PT LONG TERM GOAL #4   Title  report a 50% reduction in Rt LE pain with standing and walking    Time  8    Period  Weeks    Status  New    Target Date  07/27/17      PT LONG TERM GOAL #5  Title  demonstrate upright posture with gait to improve balance    Time  8    Period  Weeks    Status  New    Target Date  07/27/17            Plan - 06/29/17 1240    Clinical Impression Statement  Pt reports compliance with HEP.  5x sit to stand test is 19.26 seconds with use of hands on legs and instability upon standing.  Pt continues to be challenged by 2# ankle weights with seated exercises.  Pt reports Rt knee pain that limits mobility.  No Rt hip pain reported at this time.  Pt with chronic endurance and strength deficits and is only  coming 1x/wk to PT so slow progress.  Pt will continue to benefit from skilled PT for strength, gait and pain management as needed.      Rehab Potential  Good    PT Frequency  1x / week    PT Duration  8 weeks    PT Treatment/Interventions  ADLs/Self Care Home Management;Moist Heat;Cryotherapy;Gait training;Stair training;Functional mobility training;Therapeutic activities;Therapeutic exercise;Balance training;Patient/family education;Neuromuscular re-education;Manual techniques;Passive range of motion;Taping    PT Next Visit Plan  balance exercises, LE strength, endurance, pain management as needed    PT Home Exercise Plan  access code:  [email or web address]     Consulted and Agree with Plan of Care  Patient       Patient will benefit from skilled therapeutic intervention in order to improve the following deficits and impairments:  Abnormal gait, Improper body mechanics, Pain, Postural dysfunction, Decreased strength, Decreased range of motion, Decreased activity tolerance, Difficulty walking, Decreased balance  Visit Diagnosis: Pain in right hip  Chronic pain of right knee  Other abnormalities of gait and mobility  Muscle weakness (generalized)  Abnormal posture     Problem List Patient Active Problem List   Diagnosis Date Noted  . Dyspnea   . Chronic systolic heart failure ([place])   . Pain in left shin 02/07/2015  . Leukocytosis 01/31/2015  . Chronic systolic CHF (congestive heart failure), NYHA class 3 ([place]) 01/16/2015  . Protein-calorie malnutrition, severe ([place]) 01/15/2015  . Infection due to Corynebacterium diphtheriae 12/24/2014  . Bacteremia   . Fever 12/19/2014  . Chronic systolic CHF (congestive heart failure), NYHA class 4 ([place]) 10/23/2014  . Situational depression   . Frequent headaches   . Pulmonary embolism ([place]) 10/10/2014  . Chronic anticoagulation 10/10/2014  . Takotsubo syndrome 08/19/2014  . HCAP (healthcare-associated pneumonia) 08/17/2014  . H/O Asthma 08/16/2014   . LBBB (left bundle branch block) 08/16/2014  . Cardiomyopathy- etiology not yet determined 08/16/2014  . PSVT (paroxysmal supraventricular tachycardia) ([place]) 08/16/2014  . CKD (chronic kidney disease) stage 3, GFR 30-59 ml/min (HCC) 08/13/2014  . Anemia of chronic disease 08/13/2014  . Hypothyroidism 08/13/2014  . Acute cholecystitis 08/11/2014  . Abdominal pain 08/11/2014  . Essential (primary) hypertension 04/02/2014    Sigurd Sos, PT 06/29/17 1:12 PM PHYSICAL THERAPY DISCHARGE SUMMARY  Visits from Start of Care: 4  Current functional level related to goals / functional outcomes: See above.  Pt didn't return to PT.   Remaining deficits: See above for most current status.    Education / Equipment: HEP Plan: Patient agrees to discharge.  Patient goals were not met. Patient is being discharged due to not returning since the last visit.  ?????        Sigurd Sos, PT 09/28/17 2:10 PM  [facility] [address], [place], [place], [address] Phone: [phone]   Fax:  [phone]  Name: Alexandra [name] MRN: [id number] Date of Birth: 01-Jan-1928

## 2017-07-03 ENCOUNTER — Other Ambulatory Visit (HOSPITAL_COMMUNITY): Payer: Self-pay | Admitting: Internal Medicine

## 2017-07-04 ENCOUNTER — Encounter (INDEPENDENT_AMBULATORY_CARE_PROVIDER_SITE_OTHER): Payer: Self-pay | Admitting: Orthopaedic Surgery

## 2017-07-04 ENCOUNTER — Ambulatory Visit (INDEPENDENT_AMBULATORY_CARE_PROVIDER_SITE_OTHER): Payer: Medicare HMO | Admitting: Orthopaedic Surgery

## 2017-07-04 DIAGNOSIS — M170 Bilateral primary osteoarthritis of knee: Secondary | ICD-10-CM | POA: Insufficient documentation

## 2017-07-04 DIAGNOSIS — M1711 Unilateral primary osteoarthritis, right knee: Secondary | ICD-10-CM | POA: Diagnosis not present

## 2017-07-04 NOTE — Progress Notes (Signed)
Patient comes in 6 weeks after having a steroid injection in the right knee due to severe end-stage arthritis that right knee.  She has arthritis in the left knee as well as well documented however that knee does not bother her like the right knee.  She does ambulate using a quad cane but uses it in her right hand on the same side as the right knee that bothers her.  She said the injection did help some.  She is interested to have another injection today.  She is not a diabetic.  On exam there is no significant effusion of her right knee but it definitely has varus malalignment and painful range of motion with painful crepitation as well.  She understands that she cannot have a steroid injection today because it is too early.  I do feel that she is a candidate for Monovisc injection.  She had hyaluronic acid injection with Synvisc in the remote past that she had allergic reaction to.  I reassured her that Monovisc at least is bioengineered and should not have the same reaction for her.  So far she is asymptomatic on her left knee.  We will order the hyaluronic acid injection with Monovisc to the right knee and call her when it comes in.  Again she will try to use her cane in her opposite hand as well.

## 2017-07-05 ENCOUNTER — Telehealth (INDEPENDENT_AMBULATORY_CARE_PROVIDER_SITE_OTHER): Payer: Self-pay

## 2017-07-05 DIAGNOSIS — N184 Chronic kidney disease, stage 4 (severe): Secondary | ICD-10-CM | POA: Diagnosis not present

## 2017-07-05 DIAGNOSIS — I428 Other cardiomyopathies: Secondary | ICD-10-CM | POA: Diagnosis not present

## 2017-07-05 DIAGNOSIS — K219 Gastro-esophageal reflux disease without esophagitis: Secondary | ICD-10-CM | POA: Diagnosis not present

## 2017-07-05 NOTE — Telephone Encounter (Signed)
Submitted application online for Monovisc, right knee.

## 2017-07-11 ENCOUNTER — Ambulatory Visit (INDEPENDENT_AMBULATORY_CARE_PROVIDER_SITE_OTHER): Payer: Medicare HMO

## 2017-07-11 DIAGNOSIS — Z950 Presence of cardiac pacemaker: Secondary | ICD-10-CM | POA: Diagnosis not present

## 2017-07-11 DIAGNOSIS — I5022 Chronic systolic (congestive) heart failure: Secondary | ICD-10-CM

## 2017-07-12 NOTE — Progress Notes (Signed)
EPIC Encounter for ICM Monitoring  Patient Name: Alexandra [name] is a 84 y.o. female Date: 07/12/2017 Primary Care Physican: Lawerance Cruel, MD Primary Cardiologist:Skains/Bensimhon Electrophysiologist: Caryl Comes Dry Weight:160lbs Bi-V Pacing: 99%        Heart Failure questions reviewed, pt asymptomatic.   Thoracic impedance normal but was abnormal suggesting fluid accumulation from 06/12/2017 - 06/23/2017.  Prescribed dosage: Furosemide 40 mg 1 tablet every other day. Potassium 10 mEq 1 tablet daily.  LABS: 05/27/2017 Creatinine 1.41, BUN 24, Potassium 4.8, Sodium 139, EGFR 32-38 01/27/2016 Creatinine 1.50, BUN 40, Potassium 3.8, Sodium 141, EGFR 31-36  Recommendations: No changes.   Encouraged to call for fluid symptoms.  Follow-up plan: ICM clinic phone appointment on 08/16/2017.     Copy of ICM check sent to Dr. Caryl Comes.   3 month ICM trend: 07/11/2017    1 Year ICM trend:       [name], RN 07/12/2017 11:12 AM

## 2017-07-15 ENCOUNTER — Telehealth (INDEPENDENT_AMBULATORY_CARE_PROVIDER_SITE_OTHER): Payer: Self-pay

## 2017-07-15 NOTE — Telephone Encounter (Signed)
Patient approved to have Monovisc Inj. Right knee. [employer] Bill [employer] PA required Co-pay $35.00 Patient will be responsible for 20% OOP  Appt.Scheduled

## 2017-08-03 ENCOUNTER — Encounter (INDEPENDENT_AMBULATORY_CARE_PROVIDER_SITE_OTHER): Payer: Self-pay | Admitting: Orthopaedic Surgery

## 2017-08-03 ENCOUNTER — Ambulatory Visit (INDEPENDENT_AMBULATORY_CARE_PROVIDER_SITE_OTHER): Payer: Medicare HMO | Admitting: Orthopaedic Surgery

## 2017-08-03 DIAGNOSIS — M1711 Unilateral primary osteoarthritis, right knee: Secondary | ICD-10-CM | POA: Diagnosis not present

## 2017-08-03 MED ORDER — HYALURONAN 88 MG/4ML IX SOSY
88.0000 mg | PREFILLED_SYRINGE | INTRA_ARTICULAR | Status: AC | PRN
  Administered 2017-08-03: 88 mg via INTRA_ARTICULAR

## 2017-08-03 NOTE — Progress Notes (Signed)
   Procedure Note  Patient: Alexandra [name]             Date of Birth: [birth year]           MRN: [id number]             Visit Date: 08/03/2017  Procedures: Visit Diagnoses: Unilateral primary osteoarthritis, right knee  Large Joint Inj: R knee on 08/03/2017 1:14 PM Indications: diagnostic evaluation and pain Details: 22 G 1.5 in needle, superolateral approach  Arthrogram: No  Medications: 88 mg Hyaluronan 88 MG/4ML Outcome: tolerated well, no immediate complications Procedure, treatment alternatives, risks and benefits explained, specific risks discussed. Consent was given by the patient. Immediately prior to procedure a time out was called to verify the correct patient, procedure, equipment, support staff and site/side marked as required. Patient was prepped and draped in the usual sterile fashion.    Patient is here today for scheduled hyaluronic acid injection in the right knee to treat the pain from osteoarthritis.  She understands the reasoning behind the injection as well as the risk and benefits involved.  She tolerated it well.  All questions were answered and addressed.  Follow-up will be as needed.

## 2017-08-16 ENCOUNTER — Telehealth: Payer: Self-pay

## 2017-08-16 NOTE — Telephone Encounter (Signed)
Attempted to confirm remote transmission with pt. No answer and was unable to leave a message.   

## 2017-08-19 ENCOUNTER — Telehealth (INDEPENDENT_AMBULATORY_CARE_PROVIDER_SITE_OTHER): Payer: Self-pay | Admitting: Orthopaedic Surgery

## 2017-08-19 NOTE — Telephone Encounter (Signed)
Patient called wanting to know if she is to continue physical therapy.  CB#[phone].  Thank you.

## 2017-08-19 NOTE — Telephone Encounter (Signed)
Patient aware extension is fine

## 2017-08-25 NOTE — Progress Notes (Signed)
No ICM remote transmission received for 08/16/2017 and next ICM transmission scheduled for 09/08/2017.

## 2017-09-08 ENCOUNTER — Ambulatory Visit (INDEPENDENT_AMBULATORY_CARE_PROVIDER_SITE_OTHER): Payer: Medicare HMO | Admitting: *Deleted

## 2017-09-08 ENCOUNTER — Ambulatory Visit (INDEPENDENT_AMBULATORY_CARE_PROVIDER_SITE_OTHER): Payer: Medicare HMO

## 2017-09-08 DIAGNOSIS — Z950 Presence of cardiac pacemaker: Secondary | ICD-10-CM

## 2017-09-08 DIAGNOSIS — I5022 Chronic systolic (congestive) heart failure: Secondary | ICD-10-CM | POA: Diagnosis not present

## 2017-09-08 DIAGNOSIS — I428 Other cardiomyopathies: Secondary | ICD-10-CM | POA: Diagnosis not present

## 2017-09-09 ENCOUNTER — Encounter: Payer: Self-pay | Admitting: Cardiology

## 2017-09-09 NOTE — Progress Notes (Signed)
EPIC Encounter for ICM Monitoring  Patient Name: TOMEKA KANTNER is a 84 y.o. female Date: 09/09/2017 Primary Care Physican: Lawerance Cruel, MD Primary Cardiologist:Skains/Bensimhon Electrophysiologist: Caryl Comes Dry Weight:160lbs Bi-V Pacing: 98%      Heart Failure questions reviewed, pt asymptomatic.   Thoracic impedance normal.  Prescribed dosage: Furosemide 40 mg 1 tablet every other day. Potassium 10 mEq 1 tablet daily.  LABS: 05/27/2017 Creatinine 1.41, BUN 24, Potassium 4.8, Sodium 139, EGFR 32-38 01/27/2016 Creatinine 1.50, BUN 40, Potassium 3.8, Sodium 141, EGFR 31-36  Recommendations: No changes.   Encouraged to call for fluid symptoms.  Follow-up plan: ICM clinic phone appointment on 10/10/2017.    Copy of ICM check sent to Dr. Caryl Comes.   3 month ICM trend: 09/09/2017    1 Year ICM trend:       [name], RN 09/09/2017 10:39 AM

## 2017-09-09 NOTE — Progress Notes (Signed)
Remote pacemaker transmission.   

## 2017-10-10 ENCOUNTER — Ambulatory Visit (INDEPENDENT_AMBULATORY_CARE_PROVIDER_SITE_OTHER): Payer: Medicare HMO

## 2017-10-10 DIAGNOSIS — Z950 Presence of cardiac pacemaker: Secondary | ICD-10-CM | POA: Diagnosis not present

## 2017-10-10 DIAGNOSIS — I5022 Chronic systolic (congestive) heart failure: Secondary | ICD-10-CM | POA: Diagnosis not present

## 2017-10-10 NOTE — Progress Notes (Signed)
EPIC Encounter for ICM Monitoring  Patient Name: Alexandra [name] is a 84 y.o. female Date: 10/10/2017 Primary Care Physican: Lawerance Cruel, MD Primary Cardiologist:Skains/Bensimhon Electrophysiologist: Caryl Comes Dry Weight:Previous weight160lbs Bi-V Pacing: 98%        Attempted call to patient and unable to reach.  Left detailed message, per DPR, regarding transmission.  Transmission reviewed.    Thoracic impedance normal.  Prescribed: Furosemide 40 mg 1 tablet every other day. Potassium 10 mEq 1 tablet daily.  LABS: 05/27/2017 Creatinine 1.41, BUN 24, Potassium 4.8, Sodium 139, EGFR 32-38 01/27/2016 Creatinine 1.50, BUN 40, Potassium 3.8, Sodium 141, EGFR 31-36  Recommendations:  Left voice mail with ICM number and encouraged to call if experiencing any fluid symptoms.  Follow-up plan: ICM clinic phone appointment on 11/10/2017.    Copy of ICM check sent to Dr. Caryl Comes.   3 month ICM trend: 10/10/2017    1 Year ICM trend:       [name], RN 10/10/2017 3:06 PM

## 2017-10-11 ENCOUNTER — Telehealth: Payer: Self-pay

## 2017-10-11 NOTE — Telephone Encounter (Signed)
Remote ICM transmission received.  Attempted call to patient and left detailed message, per DPR, regarding transmission and next ICM scheduled for 11/10/2017.  Advised to return call for any fluid symptoms or questions.

## 2017-10-14 LAB — CUP PACEART REMOTE DEVICE CHECK
Battery Remaining Longevity: 56 mo
Brady Statistic AP VP Percent: 1 %
Brady Statistic AP VS Percent: 1 %
Brady Statistic AS VP Percent: 98 %
Brady Statistic AS VS Percent: 1.6 %
Brady Statistic RA Percent Paced: 1 %
Date Time Interrogation Session: 20190822072725
Implantable Lead Implant Date: 20161229
Implantable Lead Implant Date: 20161229
Implantable Lead Location: 753858
Implantable Lead Location: 753859
Lead Channel Impedance Value: 480 Ohm
Lead Channel Impedance Value: 750 Ohm
Lead Channel Pacing Threshold Amplitude: 0.875 V
Lead Channel Pacing Threshold Amplitude: 2.25 V
Lead Channel Pacing Threshold Pulse Width: 1 ms
Lead Channel Sensing Intrinsic Amplitude: 12 mV
Lead Channel Setting Sensing Sensitivity: 2 mV
MDC IDC LEAD IMPLANT DT: 20161229
MDC IDC LEAD LOCATION: 753860
MDC IDC MSMT BATTERY REMAINING PERCENTAGE: 89 %
MDC IDC MSMT BATTERY VOLTAGE: 2.95 V
MDC IDC MSMT LEADCHNL RA IMPEDANCE VALUE: 440 Ohm
MDC IDC MSMT LEADCHNL RA PACING THRESHOLD AMPLITUDE: 0.875 V
MDC IDC MSMT LEADCHNL RA PACING THRESHOLD PULSEWIDTH: 0.5 ms
MDC IDC MSMT LEADCHNL RA SENSING INTR AMPL: 1.9 mV
MDC IDC MSMT LEADCHNL RV PACING THRESHOLD PULSEWIDTH: 0.5 ms
MDC IDC PG IMPLANT DT: 20161229
MDC IDC PG SERIAL: 7834528
MDC IDC SET LEADCHNL LV PACING AMPLITUDE: 3 V
MDC IDC SET LEADCHNL LV PACING PULSEWIDTH: 1 ms
MDC IDC SET LEADCHNL RA PACING AMPLITUDE: 1.875
MDC IDC SET LEADCHNL RV PACING AMPLITUDE: 2 V
MDC IDC SET LEADCHNL RV PACING PULSEWIDTH: 0.5 ms
Pulse Gen Model: 3262

## 2017-10-21 DIAGNOSIS — Z23 Encounter for immunization: Secondary | ICD-10-CM | POA: Diagnosis not present

## 2017-10-21 DIAGNOSIS — N183 Chronic kidney disease, stage 3 (moderate): Secondary | ICD-10-CM | POA: Diagnosis not present

## 2017-11-10 ENCOUNTER — Ambulatory Visit (INDEPENDENT_AMBULATORY_CARE_PROVIDER_SITE_OTHER): Payer: Medicare HMO

## 2017-11-10 DIAGNOSIS — Z950 Presence of cardiac pacemaker: Secondary | ICD-10-CM

## 2017-11-10 DIAGNOSIS — I5022 Chronic systolic (congestive) heart failure: Secondary | ICD-10-CM

## 2017-11-11 ENCOUNTER — Telehealth: Payer: Self-pay

## 2017-11-11 NOTE — Telephone Encounter (Signed)
Remote ICM transmission received.  Attempted call to patient regarding ICM remote transmission and left detailed message, per DPR, with next ICM remote transmission date of 12/12/2017.  Advised to return call for any fluid symptoms or questions.

## 2017-11-11 NOTE — Progress Notes (Signed)
EPIC Encounter for ICM Monitoring  Patient Name: Alexandra [name] is a 84 y.o. female Date: 11/11/2017 Primary Care Physican: Lawerance Cruel, MD Primary Cardiologist:Skains/Bensimhon Electrophysiologist: Caryl Comes Dry Weight:Previous weight160lbs Bi-V Pacing: 98%       Attempted call to patient and unable to reach.  Left detailed message, per DPR, regarding transmission.  Transmission reviewed.    Thoracic impedance normal.   Prescribed: Furosemide 40 mg 1 tablet every other day. Potassium 10 mEq 1 tablet daily.  LABS: 05/27/2017 Creatinine 1.41, BUN 24, Potassium 4.8, Sodium 139, EGFR 32-38 01/27/2016 Creatinine 1.50, BUN 40, Potassium 3.8, Sodium 141, EGFR 31-36  Recommendations: Left voice mail with ICM number and encouraged to call if experiencing any fluid symptoms.  Follow-up plan: ICM clinic phone appointment on 12/12/2017.     Copy of ICM check sent to Dr. Caryl Comes.   3 month ICM trend: 11/11/2017    1 Year ICM trend:       [name], RN 11/11/2017 9:17 AM

## 2017-12-12 ENCOUNTER — Ambulatory Visit (INDEPENDENT_AMBULATORY_CARE_PROVIDER_SITE_OTHER): Payer: Medicare HMO

## 2017-12-12 DIAGNOSIS — Z961 Presence of intraocular lens: Secondary | ICD-10-CM | POA: Diagnosis not present

## 2017-12-12 DIAGNOSIS — Z950 Presence of cardiac pacemaker: Secondary | ICD-10-CM | POA: Diagnosis not present

## 2017-12-12 DIAGNOSIS — H02403 Unspecified ptosis of bilateral eyelids: Secondary | ICD-10-CM | POA: Diagnosis not present

## 2017-12-12 DIAGNOSIS — I5022 Chronic systolic (congestive) heart failure: Secondary | ICD-10-CM

## 2017-12-12 DIAGNOSIS — H02831 Dermatochalasis of right upper eyelid: Secondary | ICD-10-CM | POA: Diagnosis not present

## 2017-12-12 DIAGNOSIS — H10413 Chronic giant papillary conjunctivitis, bilateral: Secondary | ICD-10-CM | POA: Diagnosis not present

## 2017-12-12 DIAGNOSIS — I428 Other cardiomyopathies: Secondary | ICD-10-CM | POA: Diagnosis not present

## 2017-12-12 DIAGNOSIS — H02834 Dermatochalasis of left upper eyelid: Secondary | ICD-10-CM | POA: Diagnosis not present

## 2017-12-12 DIAGNOSIS — H04123 Dry eye syndrome of bilateral lacrimal glands: Secondary | ICD-10-CM | POA: Diagnosis not present

## 2017-12-12 DIAGNOSIS — R6889 Other general symptoms and signs: Secondary | ICD-10-CM | POA: Diagnosis not present

## 2017-12-12 NOTE — Progress Notes (Signed)
Remote pacemaker transmission.   

## 2017-12-13 NOTE — Progress Notes (Signed)
EPIC Encounter for ICM Monitoring  Patient Name: Alexandra [name] is a 84 y.o. female Date: 12/13/2017 Primary Care Physican: Lawerance Cruel, MD Primary Cardiologist:Skains/Bensimhon Electrophysiologist: Caryl Comes Last Weight:160lbs Bi-V Pacing: 98%                                                                  Heart failure questions.  She denied any fluid symptoms.  She was at her grandson's wedding during decreased impedance.     Thoracic impedance normal.   Prescribed: Furosemide 40 mg 1 tablet every other day. Potassium 10 mEq 1 tablet daily.  LABS: 05/27/2017 Creatinine 1.41, BUN 24, Potassium 4.8, Sodium 139, EGFR 32-38 01/27/2016 Creatinine 1.50, BUN 40, Potassium 3.8, Sodium 141, EGFR 31-36  Recommendations: Left voice mail with ICM number and encouraged to call if experiencing any fluid symptoms.  Follow-up plan: ICM clinic phone appointment on 01/12/2018.     Copy of ICM check sent to Dr. Caryl Comes.   3 month ICM trend: 12/12/2017    1 Year ICM trend:       [name], RN 12/13/2017 4:54 PM

## 2018-01-12 ENCOUNTER — Telehealth: Payer: Self-pay

## 2018-01-12 ENCOUNTER — Ambulatory Visit (INDEPENDENT_AMBULATORY_CARE_PROVIDER_SITE_OTHER): Payer: Medicare HMO

## 2018-01-12 DIAGNOSIS — Z950 Presence of cardiac pacemaker: Secondary | ICD-10-CM

## 2018-01-12 DIAGNOSIS — I5022 Chronic systolic (congestive) heart failure: Secondary | ICD-10-CM | POA: Diagnosis not present

## 2018-01-12 NOTE — Telephone Encounter (Signed)
Remote ICM transmission received.  Attempted call to patient regarding ICM remote transmission and left detailed message, per DPR, with next ICM remote transmission date of 02/27/2018.  Advised to return call for any fluid symptoms or questions.

## 2018-01-12 NOTE — Progress Notes (Signed)
EPIC Encounter for ICM Monitoring  Patient Name: Alexandra [name] is a 84 y.o. female Date: 01/12/2018 Primary Care Physican: Ross, Charles Alan, MD Primary Cardiologist:Skains/Bensimhon Electrophysiologist: Klein Bi-V Pacing: 98% Last Weight:160lbs   Attempted call to patient and unable to reach.  Left detailed message, per DPR, regarding transmission.  Transmission reviewed.    Thoracic impedance normal.   Prescribed:Furosemide 40 mg 1 tablet every other day. Potassium 10 mEq 1 tablet daily.  LABS: 05/27/2017 Creatinine 1.41, BUN 24, Potassium 4.8, Sodium 139, EGFR 32-38 01/27/2016 Creatinine 1.50, BUN 40, Potassium 3.8, Sodium 141, EGFR 31-36  Recommendations:Left voice mail with ICM number and encouraged to call if experiencing any fluid symptoms.  Follow-up plan: ICM clinic phone appointment on2/10/2018 since fluid levels will be checked at office visit with Dr Klein 01/27/2018.  Copy of ICM check sent to Dr.Klein.   3 month ICM trend: 01/12/2018    1 Year ICM trend:       Laurie S Short, RN 01/12/2018 2:57 PM   

## 2018-01-27 ENCOUNTER — Encounter: Payer: Self-pay | Admitting: Internal Medicine

## 2018-01-27 ENCOUNTER — Ambulatory Visit: Payer: Medicare HMO | Admitting: Internal Medicine

## 2018-01-27 ENCOUNTER — Other Ambulatory Visit: Payer: Self-pay | Admitting: Internal Medicine

## 2018-01-27 VITALS — BP 136/70 | HR 76 | Ht 64.5 in | Wt 175.2 lb

## 2018-01-27 DIAGNOSIS — Z950 Presence of cardiac pacemaker: Secondary | ICD-10-CM

## 2018-01-27 DIAGNOSIS — I471 Supraventricular tachycardia: Secondary | ICD-10-CM | POA: Diagnosis not present

## 2018-01-27 DIAGNOSIS — I447 Left bundle-branch block, unspecified: Secondary | ICD-10-CM | POA: Diagnosis not present

## 2018-01-27 LAB — CUP PACEART INCLINIC DEVICE CHECK
Battery Remaining Longevity: 63 mo
Battery Voltage: 2.95 V
Brady Statistic RA Percent Paced: 0.63 %
Brady Statistic RV Percent Paced: 98 %
Date Time Interrogation Session: 20200110142217
Implantable Lead Implant Date: 20161229
Implantable Lead Implant Date: 20161229
Implantable Lead Implant Date: 20161229
Implantable Lead Location: 753858
Implantable Lead Location: 753859
Implantable Lead Location: 753860
Implantable Pulse Generator Implant Date: 20161229
Lead Channel Impedance Value: 425 Ohm
Lead Channel Impedance Value: 462.5 Ohm
Lead Channel Pacing Threshold Amplitude: 0.75 V
Lead Channel Pacing Threshold Amplitude: 0.75 V
Lead Channel Pacing Threshold Amplitude: 1 V
Lead Channel Pacing Threshold Amplitude: 2.25 V
Lead Channel Pacing Threshold Pulse Width: 0.5 ms
Lead Channel Pacing Threshold Pulse Width: 0.5 ms
Lead Channel Pacing Threshold Pulse Width: 0.5 ms
Lead Channel Pacing Threshold Pulse Width: 0.5 ms
Lead Channel Pacing Threshold Pulse Width: 1 ms
Lead Channel Pacing Threshold Pulse Width: 1 ms
Lead Channel Sensing Intrinsic Amplitude: 12 mV
Lead Channel Sensing Intrinsic Amplitude: 2.2 mV
Lead Channel Setting Pacing Amplitude: 1.75 V
Lead Channel Setting Pacing Amplitude: 2.5 V
Lead Channel Setting Pacing Pulse Width: 0.5 ms
Lead Channel Setting Pacing Pulse Width: 1.5 ms
MDC IDC MSMT LEADCHNL LV IMPEDANCE VALUE: 737.5 Ohm
MDC IDC MSMT LEADCHNL LV PACING THRESHOLD AMPLITUDE: 2.25 V
MDC IDC MSMT LEADCHNL RA PACING THRESHOLD AMPLITUDE: 1 V
MDC IDC SET LEADCHNL RV PACING AMPLITUDE: 2 V
MDC IDC SET LEADCHNL RV SENSING SENSITIVITY: 2 mV
Pulse Gen Model: 3262
Pulse Gen Serial Number: 7834528

## 2018-01-27 MED ORDER — METOPROLOL SUCCINATE ER 25 MG PO TB24: 25.0000 mg | ORAL_TABLET | Freq: Every day | ORAL | 0 refills | Status: DC

## 2018-01-27 MED ORDER — METOPROLOL TARTRATE 25 MG PO TABS: 25.0000 mg | ORAL_TABLET | Freq: Two times a day (BID) | ORAL | 0 refills | Status: DC

## 2018-01-27 MED ORDER — ATENOLOL 25 MG PO TABS: 25.0000 mg | ORAL_TABLET | Freq: Every day | ORAL | 0 refills | Status: DC

## 2018-01-27 MED ORDER — SPIRONOLACTONE 25 MG PO TABS: 12.5000 mg | ORAL_TABLET | Freq: Once | ORAL | 0 refills | Status: DC

## 2018-01-27 MED ORDER — HYDRALAZINE HCL 25 MG PO TABS: 25.0000 mg | ORAL_TABLET | Freq: Two times a day (BID) | ORAL | 0 refills | Status: DC

## 2018-01-27 NOTE — Patient Instructions (Addendum)
Medication Instructions:  Your physician has recommended you make the following change in your medication:   1. Decrease your Aldactone to 12.5mg , half tablet, once daily 2. Decrease your Hydralazine to 25mg  two times per day.  You are being given three prescriptions for different beta blockers to try.  You may take these in any order. DO NOT TAKE MORE THAN ONE BETA BLOCKER AT A TIME. Choose one beta blocker to start with. If after two weeks, you feel the medication is working well for you, continue that current medication. If it does not work well for you, try another prescription for a beta blocker at that time. You may continue that current beta blocker at that time if it works well for you. If it does not, repeat these instructions for the third beta blocker.  When you find a beta blocker that works well for you, call the office and we will send in a prescription for you.               1. Atenolol 25mg  tablet             2. Metoprolol Succ 25mg  tablet             3. Metoprolol Tartrate 25mg  tablet, two times daily.  You may take these in any order you please.   Please call us with any questions.   Labwork: You will have labs drawn today: TSH, CBC  Testing/Procedures: None ordered.  Follow-Up: Your physician recommends that you schedule a follow-up appointment in:  Remote Check in 6 weeks on 2/21 to look for percentage of ventricular pacing.  One Year with Dr Caryl Comes  Any Other Special Instructions Will Be Listed Below (If Applicable).     If you need a refill on your cardiac medications before your next appointment, please call your pharmacy.

## 2018-01-27 NOTE — Telephone Encounter (Signed)
Pt is requesting 90 day supply. Would you like to fill for 90 days? Please address. Thank you.

## 2018-01-27 NOTE — Progress Notes (Signed)
Patient Care Team: Lawerance Cruel, MD as PCP - General (Family Medicine) Deboraha Sprang, MD as Consulting Physician (Cardiology)   HPI  Alexandra [name] is a 85 y.o. female Seen following  CRT  implant  She has a history of nonischemic cardiomyopathy and left bundle branch block. It was initially thought to be related to [place] when she presented 7/16 EF at that time was 20%. She was admitted 9/16 with low output and was treated with milrinone,  down titration of which was required because of tachycardia. 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  She required outpatient milrinone area and this was ultimately stopped 2/17. She underwent right heart catheterization 7/17 with outstanding numbers. Low filling pressures.   She originally did not respond significantly to  CRT. We reprogrammed her AV delays and underwent AV optimization by echo  DATE TEST    9/16 Echo    EF 15 %   2/17 Echo    EF 40-45 %   11/18 Echo  EF 45-50%       Date Cr K Hgb  1/18 1.5 3.8 12.2  11/18 1.46 4.4   5/19 1.41 4.8            no chest pain or edema or sob, but has significant exertional fatigue.  Interestingly, it does not occur at rest; moreover it does not occur in the shower.  Blood pressure has been reasonably controlled.  Past Medical History:  Diagnosis Date  . Adult hypothyroidism 04/02/2014  . Anemia of chronic disease 08/13/2014  . Anxiety   . Arthritis   . Asthma   . breast ca dx'd 2000   left  . CAP (community acquired pneumonia) 12/19/2012  . Chest pain    NM stress test 05/2011 Normal  . CHF (congestive heart failure) ([place]) 08/19/2014  . CKD (chronic kidney disease) stage 3, GFR 30-59 ml/min ([facility]) 08/13/2014  . Depression   . DOE (dyspnea on exertion)    No SOB at rest  . Essential (primary) hypertension 04/02/2014  . Fatigue    excertional  . XAJOINOM(767.2)   .  Hereditary and idiopathic peripheral neuropathy 07/02/2014  . Hypercholesteremia   . Intermittent LBBB (left bundle branch block) 12/21/2012  . Leg weakness   . Neuropathy   . Obesity   . Osteopenia   . Peripheral neuropathy   . Presence of permanent cardiac pacemaker 01/16/2015   BIV  . Sacroiliitis, not elsewhere classified ([place])   . Situational depression   . Trigger finger    [name]    Past Surgical History:  Procedure Laterality Date  . ACHILLES TENDON SURGERY    . BREAST LUMPECTOMY     breast cancer  . CARDIAC CATHETERIZATION N/A 07/21/2015   Procedure: Right Heart Cath;  Surgeon: Jolaine Artist, MD;  Location: [facility];  Service: Cardiovascular;  Laterality: N/A;  . CHOLECYSTECTOMY N/A 08/13/2014   Procedure: LAPAROSCOPIC CHOLECYSTECTOMY;  Surgeon: Stark Tyrell Seifer, MD;  Location: [facility];  Service: General;  Laterality: N/A;  . EP IMPLANTABLE DEVICE N/A 01/16/2015   Procedure: BiV Pacemaker Insertion CRT-P;  Surgeon: Deboraha Sprang, MD;  Location: [facility];  Service: Cardiovascular;  Laterality: N/A;  . LEG SURGERY    . LUMBAR LAMINECTOMY    . POLYPECTOMY    . TONSILLECTOMY    . VESICOVAGINAL FISTULA CLOSURE W/ TAH  DC hysterectomy    Current Outpatient Medications  Medication Sig Dispense Refill  . acetaminophen (TYLENOL) 500 MG tablet Take 500 mg by mouth every 6 (six) hours as needed for mild pain or headache. Reported on 06/17/2015    . albuterol (PROVENTIL HFA;VENTOLIN HFA) 108 (90 BASE) MCG/ACT inhaler Inhale 2 puffs into the lungs every 6 (six) hours as needed for wheezing or shortness of breath. Reported on 05/27/2015    . ALPRAZolam (XANAX) 0.25 MG tablet Take 0.25 mg by mouth 3 (three) times daily as needed for anxiety. Reported on 05/27/2015    . amoxicillin (AMOXIL) 500 MG capsule Take 4 capsules by mouth as needed 1 hour before dental work 4 capsule prn  . b complex vitamins tablet Take 1 tablet by mouth daily with lunch.     . calcium  carbonate (TUMS - DOSED IN MG ELEMENTAL CALCIUM) 500 MG chewable tablet Chew 2 tablets by mouth daily as needed for indigestion or heartburn. Reported on 07/18/2015    . cholecalciferol (VITAMIN D) 1000 UNITS tablet Take 1,000 Units by mouth daily with lunch.     . citalopram (CELEXA) 20 MG tablet TAKE 1 TABLET(20 MG) BY MOUTH DAILY 30 tablet 0  . ELIQUIS 2.5 MG TABS tablet TAKE 1 TABLET(2.5 MG) BY MOUTH TWICE DAILY 60 tablet 9  . famotidine (PEPCID) 20 MG tablet Take 20 mg by mouth daily.    . ferrous sulfate 325 (65 FE) MG tablet Take 325 mg by mouth daily with breakfast.    . furosemide (LASIX) 40 MG tablet Take one tablet (40 mg) by mouth once every other day    . hydrALAZINE (APRESOLINE) 25 MG tablet TAKE 1 TABLET BY MOUTH THREE TIMES DAILY 270 tablet 0  . lactose free nutrition (BOOST) LIQD Take 237 mLs by mouth daily with breakfast.     . levothyroxine (SYNTHROID, LEVOTHROID) 25 MCG tablet Take 25 mcg by mouth daily before breakfast. for thyroid    . MAGNESIUM-OXIDE 400 (241.3 Mg) MG tablet TAKE 1/2 TABLET(200 MG) BY MOUTH DAILY 15 tablet 11  . Multiple Vitamin (MULITIVITAMIN WITH MINERALS) TABS Take 1 tablet by mouth daily with lunch.     . nitroGLYCERIN (NITROSTAT) 0.4 MG SL tablet Place 1 tablet (0.4 mg total) under the tongue every 5 (five) minutes as needed for chest pain. 25 tablet 4  . Polyethyl Glycol-Propyl Glycol (SYSTANE OP) Place 1 drop into both eyes 2 (two) times daily.    . potassium chloride (K-DUR) 10 MEQ tablet TAKE 1 TABLET BY MOUTH DAILY 90 tablet 2  . spironolactone (ALDACTONE) 25 MG tablet TAKE 1 TABLET(25 MG) BY MOUTH DAILY 90 tablet 0   No current facility-administered medications for this visit.     Allergies  Allergen Reactions  . Amiodarone Shortness Of Breath and Nausea And Vomiting  . Cymbalta [Duloxetine Hcl] Other (See Comments)    Depression  . Lyrica [Pregabalin] Other (See Comments)    Blurry vision      Review of Systems negative except from HPI  and PMH  Physical Exam BP 136/70   Pulse 76   Ht 5' 4.5" (1.638 m)   Wt 175 lb 3.2 oz (79.5 kg)   SpO2 97%   BMI 29.61 kg/m  Well developed and nourished in no acute distress HENT normal Neck supple with JVP-5-6 Clear Regular rate and rhythm, no murmurs or gallops Abd-soft with active BS No Clubbing cyanosis edema Skin-warm and dry A & Oriented  Grossly normal sensory and motor function  ECG  P-synchronous/ AV  pacing *   Assessment and  Plan Nonischemic cardiomyopathy  Congestive heart failure-chronic-systolic  ICD-CRT-D-St. Jude's  The patient's device was interrogated.  The information was reviewed. No changes were made in the programming.     Fatigue  Renal dysfunction gd 3/4  Atrial Tachycardia  High Risk Medication Surveillance  The mechanism of her exertional fatigue is not clear.  It does not appear to be orthostatic as it is not aggravated by the shower.  She is not bothered by effort when she is sitting.  Notably, 60% of her heartbeats are faster than 90 and I wonder whether exuberant sinus response is not responsible.  This could be secondary; hence, we will check her TSH and her hemoglobin.  We will also try her on a beta-blocker  We will decrease her Aldactone.  We will also decrease her hydralazine to twice daily so as to allow for the use of a beta-blocker  Given prescription for atenolol, metoprolol succinate and metoprolol tartrate.  We spent more than 50% of our >25 min visit in face to face counseling regarding the above

## 2018-01-28 LAB — BASIC METABOLIC PANEL
BUN/Creatinine Ratio: 17 (ref 12–28)
BUN: 27 mg/dL (ref 8–27)
CO2: 22 mmol/L (ref 20–29)
Calcium: 9.6 mg/dL (ref 8.7–10.3)
Chloride: 100 mmol/L (ref 96–106)
Creatinine, Ser: 1.55 mg/dL — ABNORMAL HIGH (ref 0.57–1.00)
GFR calc Af Amer: 34 mL/min/{1.73_m2} — ABNORMAL LOW (ref >59)
GFR calc non Af Amer: 30 mL/min/{1.73_m2} — ABNORMAL LOW (ref >59)
Glucose: 94 mg/dL (ref 65–99)
Potassium: 4.8 mmol/L (ref 3.5–5.2)
Sodium: 141 mmol/L (ref 134–144)

## 2018-01-28 LAB — CBC
Hematocrit: 37.6 % (ref 34.0–46.6)
Hemoglobin: 12.7 g/dL (ref 11.1–15.9)
MCH: 32.7 pg (ref 26.6–33.0)
MCHC: 33.8 g/dL (ref 31.5–35.7)
MCV: 97 fL (ref 79–97)
Platelets: 231 10*3/uL (ref 150–450)
RBC: 3.88 x10E6/uL (ref 3.77–5.28)
RDW: 12.1 % (ref 11.7–15.4)
WBC: 7.7 10*3/uL (ref 3.4–10.8)

## 2018-01-28 LAB — TSH: TSH: 1.34 u[IU]/mL (ref 0.450–4.500)

## 2018-01-29 ENCOUNTER — Other Ambulatory Visit: Payer: Self-pay | Admitting: Internal Medicine

## 2018-02-03 ENCOUNTER — Other Ambulatory Visit: Payer: Self-pay | Admitting: Internal Medicine

## 2018-02-03 LAB — CUP PACEART REMOTE DEVICE CHECK
Battery Remaining Longevity: 55 mo
Battery Remaining Percentage: 89 %
Battery Voltage: 2.95 V
Brady Statistic AP VS Percent: 1 %
Brady Statistic AS VP Percent: 98 %
Brady Statistic AS VS Percent: 1.7 %
Brady Statistic RA Percent Paced: 1 %
Date Time Interrogation Session: 20191125070013
Implantable Lead Implant Date: 20161229
Implantable Lead Implant Date: 20161229
Implantable Lead Location: 753858
Implantable Lead Location: 753859
Implantable Lead Location: 753860
Implantable Pulse Generator Implant Date: 20161229
Lead Channel Impedance Value: 430 Ohm
Lead Channel Impedance Value: 460 Ohm
Lead Channel Impedance Value: 750 Ohm
Lead Channel Pacing Threshold Amplitude: 0.75 V
Lead Channel Pacing Threshold Amplitude: 0.875 V
Lead Channel Pacing Threshold Amplitude: 2.75 V
Lead Channel Pacing Threshold Pulse Width: 0.5 ms
Lead Channel Pacing Threshold Pulse Width: 1 ms
Lead Channel Sensing Intrinsic Amplitude: 12 mV
Lead Channel Sensing Intrinsic Amplitude: 2.1 mV
Lead Channel Setting Pacing Amplitude: 3.5 V
Lead Channel Setting Pacing Pulse Width: 0.5 ms
Lead Channel Setting Pacing Pulse Width: 1 ms
Lead Channel Setting Sensing Sensitivity: 2 mV
MDC IDC LEAD IMPLANT DT: 20161229
MDC IDC MSMT LEADCHNL RV PACING THRESHOLD PULSEWIDTH: 0.5 ms
MDC IDC PG SERIAL: 7834528
MDC IDC SET LEADCHNL RA PACING AMPLITUDE: 1.75 V
MDC IDC SET LEADCHNL RV PACING AMPLITUDE: 2 V
MDC IDC STAT BRADY AP VP PERCENT: 1 %
Pulse Gen Model: 3262

## 2018-02-03 NOTE — Telephone Encounter (Signed)
Pt is requesting 90 days supply for medication. Please address. Thank you.

## 2018-02-07 ENCOUNTER — Telehealth: Payer: Self-pay | Admitting: Internal Medicine

## 2018-02-07 NOTE — Telephone Encounter (Signed)
Pt c/o medication issue:  1. Name of Medication: Metoprolol  2. How are you currently taking this medication (dosage and times per day)?  1 time a day  3. Are you having a reaction (difficulty breathing--STAT)?   when she walks a little, she feels short of breath  4. What is your medication issue? Feel tired all the time

## 2018-02-08 NOTE — Telephone Encounter (Signed)
Spoke with pt and she has c/o fatigue since beginning metoprolol succinate. I suggested to her to start taking before bed for a few nights to see if this helps her daytime fatigue. If it does not, pt will begin another beta blocker on her beta blocker trial list. Pt had no additional questions and will call if anything changes.

## 2018-02-13 ENCOUNTER — Telehealth: Payer: Self-pay | Admitting: Internal Medicine

## 2018-02-13 NOTE — Telephone Encounter (Signed)
New message   Pt c/o medication issue:  1. Name of Medication:   metoprolol succinate (TOPROL-XL) 25 MG 24 hr tablet       2. How are you currently taking this medication (dosage and times per day)? Take 1 tablet (25 mg total) by mouth daily.at night   3. Are you having a reaction (difficulty breathing--STAT)? no  4. What is your medication issue? Making her exhausted/zombie/ cant hardly function

## 2018-02-14 NOTE — Telephone Encounter (Signed)
Pt states her Toprol does not make her feel well. I have advised pt she should try another beta blocker in her beta blocker trial. I suggested she try atenolol to see if some of her symptoms resolve. Pt verbalized understanding and had no additional questions.

## 2018-02-15 ENCOUNTER — Other Ambulatory Visit: Payer: Self-pay | Admitting: Internal Medicine

## 2018-02-21 ENCOUNTER — Telehealth: Payer: Self-pay | Admitting: Internal Medicine

## 2018-02-21 MED ORDER — DILTIAZEM HCL ER 60 MG PO CP12: 60.0000 mg | ORAL_CAPSULE | Freq: Every day | ORAL | 3 refills | Status: DC

## 2018-02-21 NOTE — Telephone Encounter (Signed)
Pt c/o medication issue:  1. Name of Medication: Atenolol  2. How are you currently taking this medication (dosage and times per day)? 1 tablet 1  time a day  3. Are you having a reaction (difficulty breathing--STAT)? No reaction at this time  4. What is your medication issue? Disoriented, hard to walk and she gets short of breath-not at this time

## 2018-02-21 NOTE — Telephone Encounter (Signed)
Pt states she is not tolerating her beta blocker trial. Per Dr Caryl Comes, he would like to start her on Diltiazem SR, 90mg  tablet once daily. Pt agrees with this plan and will call with any questions.

## 2018-02-27 ENCOUNTER — Telehealth: Payer: Self-pay | Admitting: Internal Medicine

## 2018-02-27 NOTE — Telephone Encounter (Signed)
  Pt c/o medication issue:  1. Name of Medication: diltiazem (CARDIZEM SR) 60 MG 12 hr capsule  2. How are you currently taking this medication (dosage and times per day)? Take 1 capsule (60 mg total) by mouth daily.  3. Are you having a reaction (difficulty breathing--STAT)?  No  4. What is your medication issue? Makes her feel like a zombie, wants to sleep all the time, no energy

## 2018-02-27 NOTE — Telephone Encounter (Signed)
Per Dr Caryl Comes, pt may come off her diltazem to see if she feels better. She will call if she notices an increase in her resting HR. She has verbalized understanding and had no additional questions.

## 2018-03-03 ENCOUNTER — Ambulatory Visit (INDEPENDENT_AMBULATORY_CARE_PROVIDER_SITE_OTHER): Payer: Medicare HMO

## 2018-03-03 ENCOUNTER — Telehealth: Payer: Self-pay

## 2018-03-03 DIAGNOSIS — I5022 Chronic systolic (congestive) heart failure: Secondary | ICD-10-CM | POA: Diagnosis not present

## 2018-03-03 DIAGNOSIS — Z950 Presence of cardiac pacemaker: Secondary | ICD-10-CM | POA: Diagnosis not present

## 2018-03-03 NOTE — Telephone Encounter (Signed)
Spoke with patient to remind of missed remote transmission 

## 2018-03-03 NOTE — Progress Notes (Signed)
EPIC Encounter for ICM Monitoring  Patient Name: Alexandra [name] is a 85 y.o. female Date: 03/03/2018 Primary Care Physican: Lawerance Cruel, MD Primary Cardiologist:Skains/Bensimhon Electrophysiologist: Vergie Living Pacing: >99% LastWeight:160lbs   Attempted call to patient and unable to reach.  Left detailed message, per DPR, regarding transmission.  Transmission reviewed.   Thoracic impedance normal.   Prescribed:Furosemide 40 mg 1 tablet every other day. Potassium 10 mEq 1 tablet daily.  LABS: 05/27/2017 Creatinine 1.41, BUN 24, Potassium 4.8, Sodium 139, EGFR 32-38 01/27/2016 Creatinine 1.50, BUN 40, Potassium 3.8, Sodium 141, EGFR 31-36  Recommendations:Left voice mail with ICM number and encouraged to call if experiencing any fluid symptoms.  Follow-up plan: ICM clinic phone appointment on 04/04/2018.   Office appt 03/10/2018 with Dr. Haroldine Laws.    Copy of ICM check sent to Dr. Caryl Comes.   3 month ICM trend: 03/03/2018    1 Year ICM trend:       [name], RN 03/03/2018 3:51 PM

## 2018-03-04 ENCOUNTER — Other Ambulatory Visit: Payer: Self-pay | Admitting: Internal Medicine

## 2018-03-07 ENCOUNTER — Telehealth: Payer: Self-pay

## 2018-03-07 ENCOUNTER — Other Ambulatory Visit: Payer: Self-pay | Admitting: Internal Medicine

## 2018-03-07 NOTE — Telephone Encounter (Signed)
Remote ICM transmission received.  Attempted call to patient regarding ICM remote transmission and left detailed message, per DPR, with next ICM remote transmission date of 04/10/2018.  Advised to return call for any fluid symptoms or questions.

## 2018-03-09 NOTE — Progress Notes (Signed)
Patient ID: Alexandra [name], female   DOB: 06/24/1927, 85 y.o.   MRN: [id number]     Advanced Heart Failure Clinic Note   Primary Care: Dr. Melinda Crutch Primary Cardiologist: Dr Candee Furbish Primary HF: Dr Haroldine Laws  HPI: Alexandra [name] is a 85 y.o. female  with chronic systolic CHF Echo 5/46/50 EF 15% with diffuse hypokinesis initially thought to be takatsubos CMP, HX of PE/DVT 08/2014 on Xarelto, CKD stage 3, HTN, hx of LBBB, and hypothyroidism   Previously in 2013 she had left bundle branch block, EF was low normal. Discharge weight was 196.  She was admitted in July 2016 for Abdominal pain and SOB. Her Gallbladder was thought to be causing her pain, so a lap choley was performed that admission. Echo on 08/15/14 showed EF 20%. There were thoughts that she may have stress induced CMP due to the death of her husband in May 16, 2022 after several months of hospice care.   She was directly admitted to [facility] on 10/11/14 with concerns for low output with SBPs in 80s and tachycardia.  PICC line was placed with initial co-ox of 61%.CVP was 15. Milrinone started when co-ox dropped to 53%. She had SVT on milrinone 0.25 so started on amio. Developed acute SOB that improved quickly with cessation of amio and extra dose of IV lasix. Felt to possibly have acute amio toxicity. Milrinone decreased to 0.125 and no further PSVT. Sent home on milrinone 0.125. Diuresed well on 80 mg lasix IB BID. Discharge weight was 170.  Admitted 12/1 through 12/6 with PICC line infection. Bcx with [facility] 2/2 with for diptheroids. Bcx 1/2 at [facility] GPR-Bacteremia-cornybacterium . PICC line replaced. She continued on milrinone 0.125 mcg.   S/p CRT-D placement 01/16/15 with Dr Caryl Comes.   Labs 01/29/15  with WBCs trending up 7.9 -> 10 -> 12. BCx ordered with [facility]. On 01/31/15 [facility] with 1/2 BCX + GPC clusters. (I do not have final results from them) Refused admission. Treated with home vancomycin for 1 week. PICC line not changed.   Echo 2/17 EF  ~40%  Milrinone stopped 02/2015. Has had no major issues since.   She presents today for 9 month follow up. Saw Dr Caryl Comes and he tried her on Toprol, atenolol, and diltiazem. She did not tolerate any of them. Overall doing well. She is feeling better now that she is off the above medications. She is SOB with housework at times. This is a change from her baseline. Typically uses cart when she goes to the store. No edema. Sleeps on 2 pillows at baseline. She has occasional dry cough. No fever or chills. Continues to do chair yoga. Good UOP with lasix. No CP or dizziness. No bleeding. Weighs daily. She has gained 13 lbs since she was last here. Ate [place] food the other night.  ICD interrogated personally: Thoracic impedence below threshold over the last few days. Optivol up and down over the last few months. No AF/AT. 98% BiV pacing.   RHC 7/17 RA = 1 RV = 25/2 PA = 29/9 (15) PCW = 6 Fick cardiac output/index = 5.9/3.3 PVR = 1.5 WU Ao sat = 98% PA sat = 68%, 70%, 71%  ECHO 10/02/14 LVEF 15% with diffuse hypokinesis, RV mildly dilated, normal function, PA peak pressure 59 mmHg Echo 2/17 EF 40-45% (I fetlt 35%)   Review of systems complete and found to be negative unless listed in HPI.   Past Medical History:  Diagnosis Date  . Adult hypothyroidism 04/02/2014  .  Anemia of chronic disease 08/13/2014  . Anxiety   . Arthritis   . Asthma   . breast ca dx'd 2000   left  . CAP (community acquired pneumonia) 12/19/2012  . Chest pain    NM stress test 05/2011 Normal  . CHF (congestive heart failure) ([place]) 08/19/2014  . CKD (chronic kidney disease) stage 3, GFR 30-59 ml/min (HCC) 08/13/2014  . Depression   . DOE (dyspnea on exertion)    No SOB at rest  . Essential (primary) hypertension 04/02/2014  . Fatigue    excertional  . VPXTGGYI(948.5)   . Hereditary and idiopathic peripheral neuropathy 07/02/2014  . Hypercholesteremia   . Intermittent LBBB (left bundle branch block) 12/21/2012  . Leg  weakness   . Neuropathy   . Obesity   . Osteopenia   . Peripheral neuropathy   . Presence of permanent cardiac pacemaker 01/16/2015   BIV  . Sacroiliitis, not elsewhere classified ([place])   . Situational depression   . Trigger finger    [name]    Current Outpatient Medications  Medication Sig Dispense Refill  . acetaminophen (TYLENOL) 500 MG tablet Take 500 mg by mouth every 6 (six) hours as needed for mild pain or headache. Reported on 06/17/2015    . albuterol (PROVENTIL HFA;VENTOLIN HFA) 108 (90 BASE) MCG/ACT inhaler Inhale 2 puffs into the lungs every 6 (six) hours as needed for wheezing or shortness of breath. Reported on 05/27/2015    . ALPRAZolam (XANAX) 0.25 MG tablet Take 0.25 mg by mouth 3 (three) times daily as needed for anxiety. Reported on 05/27/2015    . b complex vitamins tablet Take 1 tablet by mouth daily with lunch.     . calcium carbonate (TUMS - DOSED IN MG ELEMENTAL CALCIUM) 500 MG chewable tablet Chew 2 tablets by mouth daily as needed for indigestion or heartburn. Reported on 07/18/2015    . cholecalciferol (VITAMIN D) 1000 UNITS tablet Take 1,000 Units by mouth daily with lunch.     . citalopram (CELEXA) 20 MG tablet TAKE 1 TABLET(20 MG) BY MOUTH DAILY 30 tablet 0  . ELIQUIS 2.5 MG TABS tablet TAKE 1 TABLET(2.5 MG) BY MOUTH TWICE DAILY 60 tablet 9  . famotidine (PEPCID) 20 MG tablet Take 20 mg by mouth daily.    . ferrous sulfate 325 (65 FE) MG tablet Take 325 mg by mouth daily with breakfast.    . furosemide (LASIX) 40 MG tablet Take one tablet (40 mg) by mouth once every other day    . hydrALAZINE (APRESOLINE) 25 MG tablet Take 1 tablet (25 mg total) by mouth 2 (two) times daily. 270 tablet 0  . levothyroxine (SYNTHROID, LEVOTHROID) 25 MCG tablet Take 25 mcg by mouth daily before breakfast. for thyroid    . MAGNESIUM-OXIDE 400 (241.3 Mg) MG tablet TAKE 1/2 TABLET(200 MG) BY MOUTH DAILY 15 tablet 11  . Multiple Vitamin (MULITIVITAMIN WITH MINERALS) TABS Take 1  tablet by mouth daily with lunch.     . nitroGLYCERIN (NITROSTAT) 0.4 MG SL tablet Place 1 tablet (0.4 mg total) under the tongue every 5 (five) minutes as needed for chest pain. 25 tablet 4  . potassium chloride (K-DUR) 10 MEQ tablet TAKE 1 TABLET BY MOUTH DAILY 90 tablet 2  . spironolactone (ALDACTONE) 25 MG tablet Take 0.5 tablets (12.5 mg total) by mouth once for 1 dose. (Patient taking differently: Take 12.5 mg by mouth daily. ) 90 tablet 0  . amoxicillin (AMOXIL) 500 MG capsule Take 4 capsules  by mouth as needed 1 hour before dental work (Patient not taking: Reported on 03/10/2018) 4 capsule prn  . lactose free nutrition (BOOST) LIQD Take 237 mLs by mouth daily with breakfast.     . Polyethyl Glycol-Propyl Glycol (SYSTANE OP) Place 1 drop into both eyes 2 (two) times daily.     No current facility-administered medications for this encounter.     Allergies  Allergen Reactions  . Amiodarone Shortness Of Breath and Nausea And Vomiting  . Cymbalta [Duloxetine Hcl] Other (See Comments)    Depression  . Lyrica [Pregabalin] Other (See Comments)    Blurry vision      Social History   Socioeconomic History  . Marital status: Married    Spouse name: Not on file  . Number of children: 5  . Years of education: HS  . Highest education level: Not on file  Occupational History  . Occupation: Retired  Scientific laboratory technician  . Financial resource strain: Not on file  . Food insecurity:    Worry: Not on file    Inability: Not on file  . Transportation needs:    Medical: Not on file    Non-medical: Not on file  Tobacco Use  . Smoking status: Never Smoker  . Smokeless tobacco: Never Used  Substance and Sexual Activity  . Alcohol use: No    Alcohol/week: 0.0 standard drinks  . Drug use: No  . Sexual activity: Never  Lifestyle  . Physical activity:    Days per week: Not on file    Minutes per session: Not on file  . Stress: Not on file  Relationships  . Social connections:    Talks on  phone: Not on file    Gets together: Not on file    Attends religious service: Not on file    Active member of club or organization: Not on file    Attends meetings of clubs or organizations: Not on file    Relationship status: Not on file  . Intimate partner violence:    Fear of current or ex partner: Not on file    Emotionally abused: Not on file    Physically abused: Not on file    Forced sexual activity: Not on file  Other Topics Concern  . Not on file  Social History Narrative   Lives at home alone.   Right-handed.   Two cups caffeine daily (coffee).      Family History  Problem Relation Age of Onset  . Pneumonia Mother 58       cause of death  . Diabetes Brother   . Pancreatic cancer Father 59  . Alzheimer's disease Sister   . CVA Daughter        01/21/09  . Stroke Daughter   . Hypertension Daughter   . Heart attack Neg Hx     Vitals:   03/10/18 1051  BP: (!) 138/58  Pulse: 90  SpO2: 96%  Weight: 78.7 kg (173 lb 9.6 oz)   Wt Readings from Last 3 Encounters:  03/10/18 78.7 kg (173 lb 9.6 oz)  01/27/18 79.5 kg (175 lb 3.2 oz)  05/27/17 72.9 kg (160 lb 12 oz)    PHYSICAL EXAM: General: Elderly appearing. No resp difficulty. HEENT: Normal Neck: Supple. JVP ~10. Carotids 2+ bilat; no bruits. No thyromegaly or nodule noted. Cor: PMI nondisplaced. RRR, No M/G/R noted Lungs: CTAB, normal effort. Abdomen: Soft, non-tender, non-distended, no HSM. No bruits or masses. +BS  Extremities: No cyanosis, clubbing, or rash. R  and LLE no edema.  Neuro: Alert & orientedx3, cranial nerves grossly intact. moves all 4 extremities w/o difficulty. Affect pleasant   ASSESSMENT & PLAN:  1. Chronic Systolic Heart failure - Echo 10/02/14 EF 15% Echo 2/17 read as 40-45% reviewed probably closer to 35%. Off milrinone since 2017. s/p [employer] CRT-D placement. 01/16/15 - Echo 11/2016 LVEF 45-50%, Grade 1 DD, Moderate AI - NYHA II-III symptoms - Volume status elevated on exam in  setting of high salt intake - Take lasix 40 mg daily x 4 days, then back to every other day. She can take additional 40 mg PRN.  - Continue hydralazine 25 mg BID - Continue spiro 12.5 mg daily. Will not increase with K 4.8 on most recent labs.  - Previously failed bblocker. - No ACE due to CKD.  - Off Digoxin and corlanor with bradycardia. - Reinforced fluid restriction to < 2 L daily, sodium restriction to less than 2000 mg daily, and the importance of daily weights.   - Repeat echo.  2. H/o PE/ DVT bilateral by [place] 08/20/2014 - Continue apixaban 2.5 bid for long-term prevention of DVT per Amplify EXT trial. No bleeding. 3. CKD Stage 3  - BMET today 4. Hx of LBBB - s/p CRT-D. Sees Dr. Caryl Comes.98% BiV pacing 5. Hypertension  - SBP 110s at home.  6. Intolerance of amiodarone due to possible acute lung toxicity - No change to current plan. Off amio.   BMET, BNP Has trouble with transportation, so she would like to just add echo to her appointment with Dr Haroldine Laws rather than repeat sooner.  Take lasix 40 mg for 4 days and then as needed.   Alexandra Shore, NP  11:18 AM  Greater than 50% of the 25 minute visit was spent in counseling/coordination of care regarding disease state education, salt/fluid restriction, sliding scale diuretics, and medication compliance.

## 2018-03-10 ENCOUNTER — Ambulatory Visit (HOSPITAL_COMMUNITY)
Admission: RE | Admit: 2018-03-10 | Discharge: 2018-03-10 | Disposition: A | Discharge status: Home / Self Care | Payer: Medicare HMO | Source: Ambulatory Visit | Attending: Internal Medicine | Admitting: Internal Medicine

## 2018-03-10 ENCOUNTER — Encounter (HOSPITAL_COMMUNITY): Payer: Medicare HMO

## 2018-03-10 ENCOUNTER — Ambulatory Visit (INDEPENDENT_AMBULATORY_CARE_PROVIDER_SITE_OTHER): Payer: Medicare HMO

## 2018-03-10 ENCOUNTER — Other Ambulatory Visit: Payer: Self-pay

## 2018-03-10 ENCOUNTER — Encounter (HOSPITAL_COMMUNITY): Payer: Self-pay

## 2018-03-10 VITALS — BP 138/58 | HR 90 | Wt 173.6 lb

## 2018-03-10 DIAGNOSIS — Z800 Family history of malignant neoplasm of digestive organs: Secondary | ICD-10-CM | POA: Insufficient documentation

## 2018-03-10 DIAGNOSIS — F419 Anxiety disorder, unspecified: Secondary | ICD-10-CM | POA: Diagnosis not present

## 2018-03-10 DIAGNOSIS — F329 Major depressive disorder, single episode, unspecified: Secondary | ICD-10-CM | POA: Insufficient documentation

## 2018-03-10 DIAGNOSIS — I130 Hypertensive heart and chronic kidney disease with heart failure and stage 1 through stage 4 chronic kidney disease, or unspecified chronic kidney disease: Secondary | ICD-10-CM | POA: Diagnosis not present

## 2018-03-10 DIAGNOSIS — E039 Hypothyroidism, unspecified: Secondary | ICD-10-CM | POA: Diagnosis not present

## 2018-03-10 DIAGNOSIS — I428 Other cardiomyopathies: Secondary | ICD-10-CM

## 2018-03-10 DIAGNOSIS — I2699 Other pulmonary embolism without acute cor pulmonale: Secondary | ICD-10-CM

## 2018-03-10 DIAGNOSIS — M8580 Other specified disorders of bone density and structure, unspecified site: Secondary | ICD-10-CM | POA: Diagnosis not present

## 2018-03-10 DIAGNOSIS — G629 Polyneuropathy, unspecified: Secondary | ICD-10-CM | POA: Insufficient documentation

## 2018-03-10 DIAGNOSIS — Z79899 Other long term (current) drug therapy: Secondary | ICD-10-CM | POA: Diagnosis not present

## 2018-03-10 DIAGNOSIS — I10 Essential (primary) hypertension: Secondary | ICD-10-CM

## 2018-03-10 DIAGNOSIS — I5022 Chronic systolic (congestive) heart failure: Secondary | ICD-10-CM | POA: Insufficient documentation

## 2018-03-10 DIAGNOSIS — E7800 Pure hypercholesterolemia, unspecified: Secondary | ICD-10-CM | POA: Insufficient documentation

## 2018-03-10 DIAGNOSIS — Z86718 Personal history of other venous thrombosis and embolism: Secondary | ICD-10-CM | POA: Diagnosis not present

## 2018-03-10 DIAGNOSIS — Z950 Presence of cardiac pacemaker: Secondary | ICD-10-CM | POA: Insufficient documentation

## 2018-03-10 DIAGNOSIS — Z888 Allergy status to other drugs, medicaments and biological substances status: Secondary | ICD-10-CM | POA: Insufficient documentation

## 2018-03-10 DIAGNOSIS — Z7901 Long term (current) use of anticoagulants: Secondary | ICD-10-CM | POA: Diagnosis not present

## 2018-03-10 DIAGNOSIS — I447 Left bundle-branch block, unspecified: Secondary | ICD-10-CM

## 2018-03-10 DIAGNOSIS — Z853 Personal history of malignant neoplasm of breast: Secondary | ICD-10-CM | POA: Insufficient documentation

## 2018-03-10 DIAGNOSIS — Z86711 Personal history of pulmonary embolism: Secondary | ICD-10-CM | POA: Insufficient documentation

## 2018-03-10 DIAGNOSIS — E669 Obesity, unspecified: Secondary | ICD-10-CM | POA: Insufficient documentation

## 2018-03-10 DIAGNOSIS — Z79890 Hormone replacement therapy (postmenopausal): Secondary | ICD-10-CM | POA: Insufficient documentation

## 2018-03-10 DIAGNOSIS — J45909 Unspecified asthma, uncomplicated: Secondary | ICD-10-CM | POA: Insufficient documentation

## 2018-03-10 DIAGNOSIS — N183 Chronic kidney disease, stage 3 unspecified: Secondary | ICD-10-CM

## 2018-03-10 DIAGNOSIS — Z8249 Family history of ischemic heart disease and other diseases of the circulatory system: Secondary | ICD-10-CM | POA: Diagnosis not present

## 2018-03-10 LAB — BASIC METABOLIC PANEL
Anion gap: 9 (ref 5–15)
BUN: 27 mg/dL — ABNORMAL HIGH (ref 8–23)
CHLORIDE: 103 mmol/L (ref 98–111)
CO2: 26 mmol/L (ref 22–32)
Calcium: 9.3 mg/dL (ref 8.9–10.3)
Creatinine, Ser: 1.61 mg/dL — ABNORMAL HIGH (ref 0.44–1.00)
GFR calc Af Amer: 33 mL/min — ABNORMAL LOW (ref >60)
GFR calc non Af Amer: 28 mL/min — ABNORMAL LOW (ref >60)
Glucose, Bld: 95 mg/dL (ref 70–99)
Potassium: 4.3 mmol/L (ref 3.5–5.1)
SODIUM: 138 mmol/L (ref 135–145)

## 2018-03-10 LAB — BRAIN NATRIURETIC PEPTIDE: B Natriuretic Peptide: 45.1 pg/mL (ref 0.0–100.0)

## 2018-03-10 MED ORDER — FUROSEMIDE 40 MG PO TABS: ORAL_TABLET | ORAL | 3 refills | Status: DC

## 2018-03-10 NOTE — Patient Instructions (Signed)
Lab work done today. We will contact you for any ABNORMAL lab work.  FOR 4 DAYS ONLY: Take Furosemide 40mg  (1 tab) daily. Then resume Furosemide 40mg  (1 tab) every other day.  Your physician has requested that you have an echocardiogram. Echocardiography is a painless test that uses sound waves to create images of your heart. It provides your doctor with information about the size and shape of your heart and how well your heart's chambers and valves are working. This procedure takes approximately one hour. There are no restrictions for this procedure.  Follow up with Dr. Haroldine Laws in May with your echo appointment.

## 2018-03-11 LAB — CUP PACEART REMOTE DEVICE CHECK
Battery Remaining Longevity: 70 mo
Battery Voltage: 2.95 V
Brady Statistic AP VP Percent: 14 %
Brady Statistic AP VS Percent: 1 %
Brady Statistic AS VP Percent: 85 %
Brady Statistic AS VS Percent: 1.6 %
Brady Statistic RA Percent Paced: 13 %
Date Time Interrogation Session: 20200221155240
Implantable Lead Implant Date: 20161229
Implantable Lead Implant Date: 20161229
Implantable Lead Implant Date: 20161229
Implantable Lead Location: 753858
Implantable Lead Location: 753859
Implantable Lead Location: 753860
Implantable Pulse Generator Implant Date: 20161229
Lead Channel Impedance Value: 430 Ohm
Lead Channel Impedance Value: 460 Ohm
Lead Channel Impedance Value: 760 Ohm
Lead Channel Pacing Threshold Amplitude: 0.75 V
Lead Channel Pacing Threshold Amplitude: 0.875 V
Lead Channel Pacing Threshold Amplitude: 2 V
Lead Channel Pacing Threshold Pulse Width: 0.5 ms
Lead Channel Pacing Threshold Pulse Width: 0.5 ms
Lead Channel Pacing Threshold Pulse Width: 1.5 ms
Lead Channel Sensing Intrinsic Amplitude: 12 mV
Lead Channel Sensing Intrinsic Amplitude: 2.2 mV
Lead Channel Setting Pacing Amplitude: 1.75 V
Lead Channel Setting Pacing Amplitude: 2.5 V
Lead Channel Setting Pacing Pulse Width: 0.5 ms
Lead Channel Setting Pacing Pulse Width: 1.5 ms
Lead Channel Setting Sensing Sensitivity: 2 mV
MDC IDC MSMT BATTERY REMAINING PERCENTAGE: 89 %
MDC IDC SET LEADCHNL RV PACING AMPLITUDE: 2 V
Pulse Gen Model: 3262
Pulse Gen Serial Number: 7834528

## 2018-03-13 ENCOUNTER — Ambulatory Visit (INDEPENDENT_AMBULATORY_CARE_PROVIDER_SITE_OTHER): Payer: Medicare HMO | Admitting: *Deleted

## 2018-03-13 DIAGNOSIS — I428 Other cardiomyopathies: Secondary | ICD-10-CM | POA: Diagnosis not present

## 2018-03-13 DIAGNOSIS — I5022 Chronic systolic (congestive) heart failure: Secondary | ICD-10-CM

## 2018-03-15 ENCOUNTER — Telehealth (HOSPITAL_COMMUNITY): Payer: Self-pay | Admitting: *Deleted

## 2018-03-15 ENCOUNTER — Telehealth: Payer: Self-pay

## 2018-03-15 NOTE — Telephone Encounter (Signed)
Left message for patient to remind of missed remote transmission.  

## 2018-03-15 NOTE — Telephone Encounter (Signed)
Received call from RN w/[facility], she states they would like parameters for pt's BP and HR to know when to call our office and when to call 9-1-1.  Per Dr Haroldine Laws following order was written and faxed to them at [phone]  "Please call our office for SBP <85 or >150, HR <50 or > 110, please call 9-1-1 for SBP <80 or >190, HR <45 or >130"

## 2018-03-16 NOTE — Telephone Encounter (Signed)
[name] Rn from [place] called to clarify BP Parameters.  As they received parameters for systolic blood pressures, they needed diastolic parameters. D/w Dr. Haroldine Laws, they should call office if diastolic blood pressures were <40 or >110.

## 2018-03-16 NOTE — Addendum Note (Signed)
Addended by: Tiajuana Amass on: 03/16/2018 01:01 PM   Modules accepted: Level of Service

## 2018-03-16 NOTE — Progress Notes (Signed)
Remote pacemaker transmission.   

## 2018-03-17 ENCOUNTER — Encounter: Payer: Self-pay | Admitting: Cardiology

## 2018-03-21 ENCOUNTER — Encounter: Payer: Self-pay | Admitting: Cardiology

## 2018-03-21 NOTE — Progress Notes (Signed)
Remote pacemaker transmission.   

## 2018-03-23 ENCOUNTER — Other Ambulatory Visit: Payer: Self-pay

## 2018-04-04 ENCOUNTER — Other Ambulatory Visit: Payer: Self-pay

## 2018-04-04 ENCOUNTER — Ambulatory Visit (INDEPENDENT_AMBULATORY_CARE_PROVIDER_SITE_OTHER): Payer: Medicare HMO

## 2018-04-04 DIAGNOSIS — Z950 Presence of cardiac pacemaker: Secondary | ICD-10-CM | POA: Diagnosis not present

## 2018-04-04 DIAGNOSIS — I5022 Chronic systolic (congestive) heart failure: Secondary | ICD-10-CM | POA: Diagnosis not present

## 2018-04-05 NOTE — Progress Notes (Signed)
EPIC Encounter for ICM Monitoring  Patient Name: Alexandra [name] is a 85 y.o. female Date: 04/05/2018 Primary Care Physican: Lawerance Cruel, MD Primary Cardiologist:Skains/Bensimhon Electrophysiologist: Vergie Living Pacing: 98% LastWeight:170lbs   Heart failure questions reviewed. Pt is asymptomatic  Thoracic impedance normal.   Prescribed:Furosemide 40 mg 1 tablet every other day. Potassium 10 mEq 1 tablet daily.  LABS: 05/27/2017 Creatinine 1.41, BUN 24, Potassium 4.8, Sodium 139, EGFR 32-38 01/27/2016 Creatinine 1.50, BUN 40, Potassium 3.8, Sodium 141, EGFR 31-36  Recommendations:  No changes and encouraged her to call for any fluid symptoms.   Follow-up plan: ICM clinic phone appointment on 05/08/2018.   Office appt 05/24/2018 with Dr. Haroldine Laws.     Copy of ICM check sent to Dr. Caryl Comes.   3 month ICM trend: 04/04/2018    1 Year ICM trend:       [name], RN 04/05/2018 11:33 AM

## 2018-05-01 ENCOUNTER — Encounter (HOSPITAL_COMMUNITY): Payer: Medicare HMO | Admitting: Internal Medicine

## 2018-05-05 ENCOUNTER — Other Ambulatory Visit (HOSPITAL_COMMUNITY): Payer: Self-pay | Admitting: Internal Medicine

## 2018-05-08 ENCOUNTER — Other Ambulatory Visit: Payer: Self-pay

## 2018-05-08 ENCOUNTER — Ambulatory Visit (INDEPENDENT_AMBULATORY_CARE_PROVIDER_SITE_OTHER): Payer: Medicare HMO

## 2018-05-08 DIAGNOSIS — Z950 Presence of cardiac pacemaker: Secondary | ICD-10-CM | POA: Diagnosis not present

## 2018-05-08 DIAGNOSIS — I5022 Chronic systolic (congestive) heart failure: Secondary | ICD-10-CM | POA: Diagnosis not present

## 2018-05-09 NOTE — Progress Notes (Signed)
EPIC Encounter for ICM Monitoring  Patient Name: Alexandra [name] is a 85 y.o. female Date: 05/09/2018 Primary Care Physican: Lawerance Cruel, MD Primary Cardiologist:Skains/Bensimhon Electrophysiologist: Vergie Living Pacing: 98% LastWeight:170lbs 05/09/2018 Weight: 167 lbs   Heart failure questions reviewed. Pt is asymptomatic.  He is feeling well at this time.  She admits not adhering to low salt diet.   Thoracic impedance normal.   Prescribed:Furosemide 40 mg 1 tablet every other day. Potassium 10 mEq 1 tablet daily.  LABS: 03/10/2018 Creatinine 1.61, BUN 27, Potassium 4.3, Sodium 138, GFR 28-33 01/27/2018 Creatinine 1.55, BUN 27, Potassium 4.8, Sodium 141, GFR 30-34 05/27/2017 Creatinine 1.41, BUN 24, Potassium 4.8, Sodium 139, GFR 32-38 01/27/2016 Creatinine 1.50, BUN 40, Potassium 3.8, Sodium 141, GFR 31-36  Recommendations:  No changes and encouraged her to call for any fluid symptoms.   Follow-up plan: ICM clinic phone appointment on5/27/2020.    Copy of ICM check sent to Dr. Caryl Comes.   3 month ICM trend: 05/08/2018    1 Year ICM trend:       Alexandra Billings, RN 05/09/2018 4:23 PM

## 2018-05-16 DIAGNOSIS — E039 Hypothyroidism, unspecified: Secondary | ICD-10-CM | POA: Diagnosis not present

## 2018-05-16 DIAGNOSIS — Z853 Personal history of malignant neoplasm of breast: Secondary | ICD-10-CM | POA: Diagnosis not present

## 2018-05-16 DIAGNOSIS — I509 Heart failure, unspecified: Secondary | ICD-10-CM | POA: Diagnosis not present

## 2018-05-16 DIAGNOSIS — N184 Chronic kidney disease, stage 4 (severe): Secondary | ICD-10-CM | POA: Diagnosis not present

## 2018-05-16 DIAGNOSIS — M179 Osteoarthritis of knee, unspecified: Secondary | ICD-10-CM | POA: Diagnosis not present

## 2018-05-16 DIAGNOSIS — I10 Essential (primary) hypertension: Secondary | ICD-10-CM | POA: Diagnosis not present

## 2018-05-16 DIAGNOSIS — D649 Anemia, unspecified: Secondary | ICD-10-CM | POA: Diagnosis not present

## 2018-05-16 DIAGNOSIS — J4520 Mild intermittent asthma, uncomplicated: Secondary | ICD-10-CM | POA: Diagnosis not present

## 2018-05-16 DIAGNOSIS — N183 Chronic kidney disease, stage 3 (moderate): Secondary | ICD-10-CM | POA: Diagnosis not present

## 2018-05-24 ENCOUNTER — Other Ambulatory Visit (HOSPITAL_COMMUNITY): Payer: Medicare HMO

## 2018-05-24 ENCOUNTER — Encounter (HOSPITAL_COMMUNITY): Payer: Medicare HMO | Admitting: Internal Medicine

## 2018-06-08 DIAGNOSIS — I10 Essential (primary) hypertension: Secondary | ICD-10-CM | POA: Diagnosis not present

## 2018-06-08 DIAGNOSIS — K219 Gastro-esophageal reflux disease without esophagitis: Secondary | ICD-10-CM | POA: Diagnosis not present

## 2018-06-08 DIAGNOSIS — E039 Hypothyroidism, unspecified: Secondary | ICD-10-CM | POA: Diagnosis not present

## 2018-06-08 DIAGNOSIS — D649 Anemia, unspecified: Secondary | ICD-10-CM | POA: Diagnosis not present

## 2018-06-08 DIAGNOSIS — N184 Chronic kidney disease, stage 4 (severe): Secondary | ICD-10-CM | POA: Diagnosis not present

## 2018-06-08 DIAGNOSIS — Z1389 Encounter for screening for other disorder: Secondary | ICD-10-CM | POA: Diagnosis not present

## 2018-06-08 DIAGNOSIS — E559 Vitamin D deficiency, unspecified: Secondary | ICD-10-CM | POA: Diagnosis not present

## 2018-06-08 DIAGNOSIS — Z0000 Encounter for general adult medical examination without abnormal findings: Secondary | ICD-10-CM | POA: Diagnosis not present

## 2018-06-08 DIAGNOSIS — F430 Acute stress reaction: Secondary | ICD-10-CM | POA: Diagnosis not present

## 2018-06-09 ENCOUNTER — Other Ambulatory Visit: Payer: Self-pay | Admitting: Family Medicine

## 2018-06-09 DIAGNOSIS — Z1231 Encounter for screening mammogram for malignant neoplasm of breast: Secondary | ICD-10-CM

## 2018-06-13 ENCOUNTER — Ambulatory Visit (INDEPENDENT_AMBULATORY_CARE_PROVIDER_SITE_OTHER): Payer: Medicare HMO | Admitting: *Deleted

## 2018-06-13 DIAGNOSIS — I5022 Chronic systolic (congestive) heart failure: Secondary | ICD-10-CM

## 2018-06-13 DIAGNOSIS — I428 Other cardiomyopathies: Secondary | ICD-10-CM

## 2018-06-13 LAB — CUP PACEART REMOTE DEVICE CHECK
Date Time Interrogation Session: 20200526154705
Implantable Lead Implant Date: 20161229
Implantable Lead Implant Date: 20161229
Implantable Lead Implant Date: 20161229
Implantable Lead Location: 753858
Implantable Lead Location: 753859
Implantable Lead Location: 753860
Implantable Pulse Generator Implant Date: 20161229
Pulse Gen Model: 3262
Pulse Gen Serial Number: 7834528

## 2018-06-14 ENCOUNTER — Ambulatory Visit (INDEPENDENT_AMBULATORY_CARE_PROVIDER_SITE_OTHER): Payer: Medicare HMO

## 2018-06-14 DIAGNOSIS — Z950 Presence of cardiac pacemaker: Secondary | ICD-10-CM | POA: Diagnosis not present

## 2018-06-14 DIAGNOSIS — I5022 Chronic systolic (congestive) heart failure: Secondary | ICD-10-CM | POA: Diagnosis not present

## 2018-06-16 NOTE — Progress Notes (Signed)
EPIC Encounter for ICM Monitoring  Patient Name: Alexandra [name] is a 85 y.o. female Date: 06/16/2018 Primary Care Physican: Lawerance Cruel, MD Primary Cardiologist:Skains/Bensimhon Electrophysiologist: Vergie Living Pacing: 98% 06/16/2018 Weight: 172 lbs   Heart failure questions reviewed. Pt is asymptomatic.  He is feeling well at this time.  She admits not adhering to low salt diet all the time.   Corvue Thoracic impedance normal.   Prescribed:Furosemide 40 mg 1 tablet every other day. Potassium 10 mEq 1 tablet daily.  LABS: 03/10/2018 Creatinine 1.61, BUN 27, Potassium 4.3, Sodium 138, GFR 28-33 01/27/2018 Creatinine 1.55, BUN 27, Potassium 4.8, Sodium 141, GFR 30-34 05/27/2017 Creatinine 1.41, BUN 24, Potassium 4.8, Sodium 139, GFR 32-38 01/27/2016 Creatinine 1.50, BUN 40, Potassium 3.8, Sodium 141, GFR 31-36  Recommendations: No changes and encouraged her to call for any fluid symptoms.  Follow-up plan: ICM clinic phone appointment on6/29/2020.   Copy of ICM check sent to [place].    3 month ICM trend: 06/13/2018    1 Year ICM trend:       [name], RN 06/16/2018 3:40 PM

## 2018-06-26 NOTE — Progress Notes (Signed)
Remote pacemaker transmission.   

## 2018-06-27 ENCOUNTER — Telehealth (HOSPITAL_COMMUNITY): Payer: Self-pay | Admitting: Vascular Surgery

## 2018-06-27 ENCOUNTER — Telehealth (HOSPITAL_COMMUNITY): Payer: Self-pay | Admitting: *Deleted

## 2018-06-27 NOTE — Telephone Encounter (Signed)
Would just watch. If symptoms not improving try one dose extra lasix.

## 2018-06-27 NOTE — Telephone Encounter (Signed)
Pt called c/o increased SOB over past week, she states it occurs w/exertion, she states today she mopped the floor in kitchen and living room then had to stop and rest to catch her breath.  She does not wt herself daily, she denies any edema and states other than the sob she generallly feels well.  She is sch for echo and f/u on 7/17, she is taking her Lasix 40 mg every other day as directed.  She does not feel this sob is from fluid retention and states her monitor check was fine last week, per chart Corvue was done on 5/28 and did not show elevated fluid at that time however reminded her it was 2 weeks ago.  Will send to Dr Haroldine Laws for review as she is reluctant to take extra lasix.

## 2018-06-28 NOTE — Telephone Encounter (Signed)
Spoke w/pt, she is aware and agreeable

## 2018-07-13 DIAGNOSIS — R21 Rash and other nonspecific skin eruption: Secondary | ICD-10-CM | POA: Diagnosis not present

## 2018-07-13 DIAGNOSIS — I509 Heart failure, unspecified: Secondary | ICD-10-CM | POA: Diagnosis not present

## 2018-07-17 ENCOUNTER — Ambulatory Visit (INDEPENDENT_AMBULATORY_CARE_PROVIDER_SITE_OTHER): Payer: Medicare HMO

## 2018-07-17 DIAGNOSIS — I5022 Chronic systolic (congestive) heart failure: Secondary | ICD-10-CM | POA: Diagnosis not present

## 2018-07-17 DIAGNOSIS — Z950 Presence of cardiac pacemaker: Secondary | ICD-10-CM

## 2018-07-18 ENCOUNTER — Telehealth: Payer: Self-pay

## 2018-07-18 NOTE — Telephone Encounter (Signed)
Remote ICM transmission received.  Attempted call to patient regarding ICM remote transmission and left message, per DPR, to return call.    

## 2018-07-18 NOTE — Progress Notes (Signed)
EPIC Encounter for ICM Monitoring  Patient Name: Alexandra [name] is a 85 y.o. female Date: 07/18/2018 Primary Care Physican: Lawerance Cruel, MD Primary Cardiologist:Skains/Bensimhon Electrophysiologist: Vergie Living Pacing: 98% 06/16/2018 Weight: 172 lbs 07/18/2018 Weight: 163 lbs   Heart failure questions reviewed. Pt is asymptomatic.   Corvue Thoracic impedance suggestive of ongoing possible fluid accumulation since 07/07/2018.   Prescribed:Furosemide 40 mg Take one tablet (40 mg) by mouth once every other day. May take extra as needed. Potassium 10 mEq 1 tablet daily.  LABS: 03/10/2018 Creatinine 1.61, BUN 27, Potassium 4.3, Sodium 138, GFR 28-33 01/27/2018 Creatinine 1.55, BUN 27, Potassium 4.8, Sodium 141, GFR 30-34 05/27/2017 Creatinine 1.41, BUN 24, Potassium 4.8, Sodium 139, GFR 32-38 01/27/2016 Creatinine 1.50, BUN 40, Potassium 3.8, Sodium 141, GFR 31-36  Recommendations: Advised to take extra Furosemide as written on prescription x 2 days and then return to prescribed dosage of 1 tablet every other day.  Follow-up plan: ICM clinic phone appointment on7/07/2018 (manual send) to recheck fluid levels.   Copy of ICM check sent to Dr.Klein and Dr Vaughan Browner.   3 month ICM trend: 07/17/2018    1 Year ICM trend:       [name], RN 07/18/2018 8:40 AM

## 2018-07-18 NOTE — Progress Notes (Signed)
Patient called back and will not be in town on 07/25/2018 to send a manual transmission.  Rescheduled for 07/31/2018.

## 2018-07-31 ENCOUNTER — Ambulatory Visit (INDEPENDENT_AMBULATORY_CARE_PROVIDER_SITE_OTHER): Payer: Medicare HMO

## 2018-07-31 DIAGNOSIS — Z950 Presence of cardiac pacemaker: Secondary | ICD-10-CM

## 2018-07-31 DIAGNOSIS — I5022 Chronic systolic (congestive) heart failure: Secondary | ICD-10-CM

## 2018-08-01 NOTE — Progress Notes (Signed)
EPIC Encounter for ICM Monitoring  Patient Name: Alexandra [name] is a 85 y.o. female Date: 08/01/2018 Primary Care Physican: Lawerance Cruel, MD Primary Cardiologist:Skains/Bensimhon Electrophysiologist: Vergie Living Pacing: 98% 07/18/2018 Weight: 163 lbs 08/01/2018 Weight: 164 lbs   Heart failure questions reviewed. Pt is asymptomatic. She ate foods higher in salt during her beach vacation last week but is feeling fine.  CorvueThoracic impedance suggestive of possible fluid accumulation since 07/28/2018 but has returned close to baseline.   Prescribed:Furosemide 40 mg Take one tablet (40 mg) by mouth once every other day. May take extra as needed. Potassium 10 mEq 1 tablet daily.  LABS: 03/10/2018 Creatinine 1.61, BUN 27, Potassium 4.3, Sodium 138, GFR 28-33 01/27/2018 Creatinine 1.55, BUN 27, Potassium 4.8, Sodium 141, GFR 30-34 05/27/2017 Creatinine 1.41, BUN 24, Potassium 4.8, Sodium 139, GFR 32-38 01/27/2016 Creatinine 1.50, BUN 40, Potassium 3.8, Sodium 141, GFR 31-36  Recommendations: No changes and encouraged to call if experiencing any fluid symptoms.  Follow-up plan: ICM clinic phone appointment on8/03/2018. Echo and office visit scheduled for 08/04/2018 and reminded patient of appointment.  Copy of ICM check sent to [place].  3 month ICM trend: 07/31/2018    1 Year ICM trend:       [name], RN 08/01/2018 8:40 AM

## 2018-08-04 ENCOUNTER — Ambulatory Visit (HOSPITAL_BASED_OUTPATIENT_CLINIC_OR_DEPARTMENT_OTHER)
Admission: RE | Admit: 2018-08-04 | Discharge: 2018-08-04 | Disposition: A | Discharge status: Home / Self Care | Payer: Medicare HMO | Source: Ambulatory Visit | Attending: Internal Medicine | Admitting: Internal Medicine

## 2018-08-04 ENCOUNTER — Other Ambulatory Visit: Payer: Self-pay

## 2018-08-04 ENCOUNTER — Ambulatory Visit (HOSPITAL_COMMUNITY)
Admission: RE | Admit: 2018-08-04 | Discharge: 2018-08-04 | Disposition: A | Discharge status: Home / Self Care | Payer: Medicare HMO | Source: Ambulatory Visit | Attending: Cardiology | Admitting: Cardiology

## 2018-08-04 VITALS — BP 132/55 | HR 94 | Wt 170.0 lb

## 2018-08-04 DIAGNOSIS — F419 Anxiety disorder, unspecified: Secondary | ICD-10-CM | POA: Diagnosis not present

## 2018-08-04 DIAGNOSIS — I130 Hypertensive heart and chronic kidney disease with heart failure and stage 1 through stage 4 chronic kidney disease, or unspecified chronic kidney disease: Secondary | ICD-10-CM | POA: Diagnosis not present

## 2018-08-04 DIAGNOSIS — Z950 Presence of cardiac pacemaker: Secondary | ICD-10-CM | POA: Diagnosis not present

## 2018-08-04 DIAGNOSIS — Z86718 Personal history of other venous thrombosis and embolism: Secondary | ICD-10-CM | POA: Diagnosis not present

## 2018-08-04 DIAGNOSIS — I5022 Chronic systolic (congestive) heart failure: Secondary | ICD-10-CM | POA: Diagnosis not present

## 2018-08-04 DIAGNOSIS — Z79899 Other long term (current) drug therapy: Secondary | ICD-10-CM | POA: Insufficient documentation

## 2018-08-04 DIAGNOSIS — M8580 Other specified disorders of bone density and structure, unspecified site: Secondary | ICD-10-CM | POA: Diagnosis not present

## 2018-08-04 DIAGNOSIS — J45909 Unspecified asthma, uncomplicated: Secondary | ICD-10-CM | POA: Diagnosis not present

## 2018-08-04 DIAGNOSIS — I351 Nonrheumatic aortic (valve) insufficiency: Secondary | ICD-10-CM | POA: Diagnosis not present

## 2018-08-04 DIAGNOSIS — G629 Polyneuropathy, unspecified: Secondary | ICD-10-CM | POA: Diagnosis not present

## 2018-08-04 DIAGNOSIS — Z86711 Personal history of pulmonary embolism: Secondary | ICD-10-CM | POA: Diagnosis not present

## 2018-08-04 DIAGNOSIS — Z7901 Long term (current) use of anticoagulants: Secondary | ICD-10-CM | POA: Insufficient documentation

## 2018-08-04 DIAGNOSIS — F329 Major depressive disorder, single episode, unspecified: Secondary | ICD-10-CM | POA: Insufficient documentation

## 2018-08-04 DIAGNOSIS — I10 Essential (primary) hypertension: Secondary | ICD-10-CM | POA: Diagnosis not present

## 2018-08-04 DIAGNOSIS — M1990 Unspecified osteoarthritis, unspecified site: Secondary | ICD-10-CM | POA: Insufficient documentation

## 2018-08-04 DIAGNOSIS — I447 Left bundle-branch block, unspecified: Secondary | ICD-10-CM | POA: Diagnosis not present

## 2018-08-04 DIAGNOSIS — E039 Hypothyroidism, unspecified: Secondary | ICD-10-CM | POA: Insufficient documentation

## 2018-08-04 DIAGNOSIS — N183 Chronic kidney disease, stage 3 (moderate): Secondary | ICD-10-CM | POA: Insufficient documentation

## 2018-08-04 DIAGNOSIS — E7800 Pure hypercholesterolemia, unspecified: Secondary | ICD-10-CM | POA: Diagnosis not present

## 2018-08-04 DIAGNOSIS — I471 Supraventricular tachycardia: Secondary | ICD-10-CM | POA: Insufficient documentation

## 2018-08-04 LAB — CBC
HCT: 41.4 % (ref 36.0–46.0)
Hemoglobin: 13.4 g/dL (ref 12.0–15.0)
MCH: 32.8 pg (ref 26.0–34.0)
MCHC: 32.4 g/dL (ref 30.0–36.0)
MCV: 101.2 fL — ABNORMAL HIGH (ref 80.0–100.0)
Platelets: 203 10*3/uL (ref 150–400)
RBC: 4.09 MIL/uL (ref 3.87–5.11)
RDW: 12.6 % (ref 11.5–15.5)
WBC: 7.1 10*3/uL (ref 4.0–10.5)
nRBC: 0 % (ref 0.0–0.2)

## 2018-08-04 LAB — BASIC METABOLIC PANEL
Anion gap: 12 (ref 5–15)
BUN: 34 mg/dL — ABNORMAL HIGH (ref 8–23)
CO2: 26 mmol/L (ref 22–32)
Calcium: 9.8 mg/dL (ref 8.9–10.3)
Chloride: 99 mmol/L (ref 98–111)
Creatinine, Ser: 1.79 mg/dL — ABNORMAL HIGH (ref 0.44–1.00)
GFR calc Af Amer: 29 mL/min — ABNORMAL LOW (ref >60)
GFR calc non Af Amer: 25 mL/min — ABNORMAL LOW (ref >60)
Glucose, Bld: 108 mg/dL — ABNORMAL HIGH (ref 70–99)
Potassium: 4 mmol/L (ref 3.5–5.1)
Sodium: 137 mmol/L (ref 135–145)

## 2018-08-04 LAB — TSH: TSH: 1.458 u[IU]/mL (ref 0.350–4.500)

## 2018-08-04 LAB — T4, FREE: Free T4: 1.03 ng/dL (ref 0.61–1.12)

## 2018-08-04 NOTE — Addendum Note (Signed)
Encounter addended by: Jolaine Artist, MD on: 08/04/2018 6:17 PM  Actions taken: Charge Capture section accepted

## 2018-08-04 NOTE — Progress Notes (Signed)
  Echocardiogram 2D Echocardiogram has been performed.  Randa Lynn Harbert Fitterer 08/04/2018, 12:27 PM

## 2018-08-04 NOTE — Progress Notes (Signed)
Patient ID: Alexandra [name], female   DOB: 1927/07/22, 85 y.o.   MRN: [id number]     Advanced Heart Failure Clinic Note   Primary Care: Dr. Melinda Crutch Primary Cardiologist: Dr Candee Furbish Primary HF: Dr Haroldine Laws  HPI: Alexandra [name] is a 85 y.o. 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 Discharge weight was 196.  She was admitted in July 2016 for Abdominal pain and SOB. Her Gallbladder was thought to be causing her pain, so a lap choley was performed that admission. Echo on 08/15/14 showed EF 20%. There were thoughts that she may have stress induced CMP due to the death of her husband in 2022/06/01 after several months of hospice care.   She was directly admitted to [facility] on 10/11/14 with concerns for low output with SBPs in 80s and tachycardia.  PICC line was placed with initial co-ox of 61%.CVP was 15. Milrinone started when co-ox dropped to 53%. She had SVT on milrinone 0.25 so started on amio. Developed acute SOB that improved quickly with cessation of amio and extra dose of IV lasix. Felt to possibly have acute amio toxicity. Milrinone decreased to 0.125 and no further PSVT. Sent home on milrinone 0.125. Diuresed well on 80 mg lasix IB BID. Discharge weight was 170.  Admitted 12/1 through 12/6 with PICC line infection. Bcx with [facility] 2/2 with for diptheroids. Bcx 1/2 at [facility]-Bacteremia-cornybacterium . PICC line replaced. She continued on milrinone 0.125 mcg.   S/p CRT-D placement 01/16/15 with Dr Caryl Comes.   Echo 2/17 EF ~40% Milrinone stopped 02/2015. Has had no major issues since.   She presents for routine f/u. Still feels tired. Able to do ADLs but not going out due to [place]. No CP, edema, orthopnea or PND.   ICD interrogated personally: Fluid looks good. 98%  BIV pacing No VT/AF. On Eliquis 5 bid. No bleeding  Echo today EF 55%  RHC 7/17 RA = 1 RV = 25/2 PA = 29/9 (15) PCW = 6 Fick cardiac output/index = 5.9/3.3 PVR = 1.5 WU Ao sat = 98% PA sat = 68%, 70%, 71%  ECHO 10/02/14 LVEF 15% with diffuse hypokinesis, RV mildly dilated, normal function, PA peak pressure 59 mmHg Echo 2/17 EF 40-45% (I fetlt 35%)   Review of systems complete and found to be negative unless listed in HPI.   Past Medical History:  Diagnosis Date  . Adult hypothyroidism 04/02/2014  . Anemia of chronic disease 08/13/2014  . Anxiety   . Arthritis   . Asthma   . breast ca dx'd 2000   left  . CAP (community acquired pneumonia) 12/19/2012  . Chest pain    NM stress test 05/2011 Normal  . CHF (congestive heart failure) ([place]) 08/19/2014  . CKD (chronic kidney disease) stage 3, GFR 30-59 ml/min (HCC) 08/13/2014  . Depression   . DOE (dyspnea on exertion)    No SOB at rest  . Essential (primary) hypertension 04/02/2014  . Fatigue    excertional  . RCVELFYB(017.5)   . Hereditary and idiopathic peripheral neuropathy 07/02/2014  . Hypercholesteremia   . Intermittent LBBB (left bundle branch block) 12/21/2012  . Leg weakness   . Neuropathy   . Obesity   . Osteopenia   . Peripheral neuropathy   .  Presence of permanent cardiac pacemaker 01/16/2015   BIV  . Sacroiliitis, not elsewhere classified ([place])   . Situational depression   . Trigger finger    [name]    Current Outpatient Medications  Medication Sig Dispense Refill  . acetaminophen (TYLENOL) 500 MG tablet Take 500 mg by mouth every 6 (six) hours as needed for mild pain or headache. Reported on 06/17/2015    . albuterol (PROVENTIL HFA;VENTOLIN HFA) 108 (90 BASE) MCG/ACT inhaler Inhale 2 puffs into the lungs every 6 (six) hours as needed for wheezing or shortness of breath. Reported on 05/27/2015    . ALPRAZolam (XANAX) 0.25 MG tablet Take 0.25 mg by mouth 3 (three) times daily as needed for anxiety. Reported on  05/27/2015    . b complex vitamins tablet Take 1 tablet by mouth daily with lunch.     . calcium carbonate (TUMS - DOSED IN MG ELEMENTAL CALCIUM) 500 MG chewable tablet Chew 2 tablets by mouth daily as needed for indigestion or heartburn. Reported on 07/18/2015    . cholecalciferol (VITAMIN D) 1000 UNITS tablet Take 1,000 Units by mouth daily with lunch.     . citalopram (CELEXA) 20 MG tablet TAKE 1 TABLET(20 MG) BY MOUTH DAILY 30 tablet 0  . ELIQUIS 2.5 MG TABS tablet TAKE 1 TABLET(2.5 MG) BY MOUTH TWICE DAILY 60 tablet 9  . famotidine (PEPCID) 20 MG tablet Take 20 mg by mouth daily.    . ferrous sulfate 325 (65 FE) MG tablet Take 325 mg by mouth daily with breakfast.    . furosemide (LASIX) 40 MG tablet Take one tablet (40 mg) by mouth once every other day. May take extra as needed. 30 tablet 3  . hydrALAZINE (APRESOLINE) 25 MG tablet Take 1 tablet (25 mg total) by mouth 2 (two) times daily. 270 tablet 0  . lactose free nutrition (BOOST) LIQD Take 237 mLs by mouth daily with breakfast.     . levothyroxine (SYNTHROID, LEVOTHROID) 25 MCG tablet Take 25 mcg by mouth daily before breakfast. for thyroid    . MAGNESIUM-OXIDE 400 (241.3 Mg) MG tablet TAKE 1/2 TABLET(200 MG) BY MOUTH DAILY 15 tablet 11  . Multiple Vitamin (MULITIVITAMIN WITH MINERALS) TABS Take 1 tablet by mouth daily with lunch.     . nitroGLYCERIN (NITROSTAT) 0.4 MG SL tablet Place 1 tablet (0.4 mg total) under the tongue every 5 (five) minutes as needed for chest pain. 25 tablet 4  . Polyethyl Glycol-Propyl Glycol (SYSTANE OP) Place 1 drop into both eyes 2 (two) times daily.    . potassium chloride (K-DUR) 10 MEQ tablet TAKE 1 TABLET BY MOUTH DAILY 90 tablet 2  . amoxicillin (AMOXIL) 500 MG capsule Take 4 capsules by mouth as needed 1 hour before dental work (Patient not taking: Reported on 03/10/2018) 4 capsule prn  . spironolactone (ALDACTONE) 25 MG tablet Take 0.5 tablets (12.5 mg total) by mouth once for 1 dose. (Patient taking  differently: Take 12.5 mg by mouth daily. ) 90 tablet 0   No current facility-administered medications for this encounter.     Allergies  Allergen Reactions  . Amiodarone Shortness Of Breath and Nausea And Vomiting  . Cymbalta [Duloxetine Hcl] Other (See Comments)    Depression  . Lyrica [Pregabalin] Other (See Comments)    Blurry vision      Social History   Socioeconomic History  . Marital status: Married    Spouse name: Not on file  . Number of children: 5  . Years  of education: HS  . Highest education level: Not on file  Occupational History  . Occupation: Retired  Scientific laboratory technician  . Financial resource strain: Not on file  . Food insecurity    Worry: Not on file    Inability: Not on file  . Transportation needs    Medical: Not on file    Non-medical: Not on file  Tobacco Use  . Smoking status: Never Smoker  . Smokeless tobacco: Never Used  Substance and Sexual Activity  . Alcohol use: No    Alcohol/week: 0.0 standard drinks  . Drug use: No  . Sexual activity: Never  Lifestyle  . Physical activity    Days per week: Not on file    Minutes per session: Not on file  . Stress: Not on file  Relationships  . Social Herbalist on phone: Not on file    Gets together: Not on file    Attends religious service: Not on file    Active member of club or organization: Not on file    Attends meetings of clubs or organizations: Not on file    Relationship status: Not on file  . Intimate partner violence    Fear of current or ex partner: Not on file    Emotionally abused: Not on file    Physically abused: Not on file    Forced sexual activity: Not on file  Other Topics Concern  . Not on file  Social History Narrative   Lives at home alone.   Right-handed.   Two cups caffeine daily (coffee).      Family History  Problem Relation Age of Onset  . Pneumonia Mother 68       cause of death  . Diabetes Brother   . Pancreatic cancer Father 50  . Alzheimer's  disease Sister   . CVA Daughter        01/21/09  . Stroke Daughter   . Hypertension Daughter   . Heart attack Neg Hx     Vitals:   08/04/18 1224  BP: (!) 132/55  Pulse: 94  SpO2: 100%  Weight: 77.1 kg (170 lb)   Wt Readings from Last 3 Encounters:  08/04/18 77.1 kg (170 lb)  03/10/18 78.7 kg (173 lb 9.6 oz)  01/27/18 79.5 kg (175 lb 3.2 oz)    PHYSICAL EXAM: General:  Well appearing. No resp difficulty HEENT: normal Neck: supple. no JVD. Carotids 2+ bilat; no bruits. No lymphadenopathy or thryomegaly appreciated. Cor: PMI nondisplaced. Regular rate & rhythm. No rubs, gallops or murmurs. Lungs: clear Abdomen: soft, nontender, nondistended. No hepatosplenomegaly. No bruits or masses. Good bowel sounds. Extremities: no cyanosis, clubbing, rash, edema Neuro: alert & orientedx3, cranial nerves grossly intact. moves all 4 extremities w/o difficulty. Affect pleasant  ASSESSMENT & PLAN:  1. Chronic Systolic Heart failure - Echo 10/02/14 EF 15% Echo 2/17 read as 40-45% reviewed probably closer to 35%. Off milrinone since 2017. s/p St Jude CRT-D placement. 01/16/15 - Echo 11/2016 LVEF 45-50%, Grade 1 DD, Moderate AI - Echo today EF 55% (completely recovered with CRT) - NYHA II-III symptoms - Volume status elevated on exam in setting of high salt intake - Volume status looks good on exam and Corevue. Continue lasix 40 qod. Takes extra as needed. Follows with Sharman Cheek - Continue hydralazine 25 mg BID - Continue spiro 12.5 mg daily - Previously failed bblocker. - No ACE due to CKD.  - Off Digoxin and corlanor with bradycardia. - Reinforced  fluid restriction to < 2 L daily, sodium restriction to less than 2000 mg daily, and the importance of daily weights.   - Encouraged her to be more active and walk driveway several times per day 2. H/o PE/ DVT bilateral by [place] 08/20/2014 - Continue apixaban 2.5 bid for long-term prevention of DVT per Amplify EXT trial. No bleeding. 3. CKD Stage 3   - Last creatinine 1.6 in 2/20. BMET today 4. Hx of LBBB - s/p CRT-D. Sees Dr. Caryl Comes.98% BiV pacing 5. Hypertension  - Blood pressure well controlled. Continue current regimen. 6. Intolerance of amiodarone due to possible acute lung toxicity - No change to current plan. Off amio.     Glori Bickers, MD  12:41 PM

## 2018-08-04 NOTE — Patient Instructions (Signed)
Labs today We will only contact you if something comes back abnormal or we need to make some changes. Otherwise no news is good news!  Your physician recommends that you schedule a follow-up appointment in:1 year, you will get a call to schedule this appointment.   .At the [facility], you and your health needs are our priority. As part of our continuing mission to provide you with exceptional heart care, we have created designated Provider Care Teams. These Care Teams include your primary Cardiologist (physician) and Advanced Practice Providers (APPs- Physician Assistants and Nurse Practitioners) who all work together to provide you with the care you need, when you need it.   You may see any of the following providers on your designated Care Team at your next follow up: Marland Kitchen Dr Glori Bickers . Dr Loralie Champagne . Darrick Grinder, NP

## 2018-08-04 NOTE — Addendum Note (Signed)
Encounter addended by: Valeda Malm, RN on: 08/04/2018 12:55 PM  Actions taken: Order list changed, Diagnosis association updated, Clinical Note Signed, Charge Capture section accepted

## 2018-08-05 LAB — T3, FREE: T3, Free: 2.3 pg/mL (ref 2.0–4.4)

## 2018-08-10 NOTE — Telephone Encounter (Signed)
Encounter opened in error

## 2018-08-21 ENCOUNTER — Ambulatory Visit (INDEPENDENT_AMBULATORY_CARE_PROVIDER_SITE_OTHER): Payer: Medicare HMO

## 2018-08-21 DIAGNOSIS — Z950 Presence of cardiac pacemaker: Secondary | ICD-10-CM

## 2018-08-21 DIAGNOSIS — I5022 Chronic systolic (congestive) heart failure: Secondary | ICD-10-CM

## 2018-08-23 NOTE — Progress Notes (Signed)
EPIC Encounter for ICM Monitoring  Patient Name: Alexandra [name] is a 85 y.o. female Date: 08/23/2018 Primary Care Physican: Lawerance Cruel, MD Primary Cardiologist:Skains/Bensimhon Electrophysiologist: Vergie Living Pacing: 98% 07/18/2018 Weight:163lbs 08/01/2018 Weight: 164 lbs   Transmission reviewed.  CorvueThoracic impedancenormal.  Prescribed:Furosemide 40 mgTake one tablet (40 mg) by mouth once every other day. May take extra as needed.Potassium 10 mEq 1 tablet daily.  LABS: 03/10/2018 Creatinine 1.61, BUN 27, Potassium 4.3, Sodium 138, GFR 28-33 01/27/2018 Creatinine 1.55, BUN 27, Potassium 4.8, Sodium 141, GFR 30-34 05/27/2017 Creatinine 1.41, BUN 24, Potassium 4.8, Sodium 139, GFR 32-38 01/27/2016 Creatinine 1.50, BUN 40, Potassium 3.8, Sodium 141, GFR 31-36  Recommendations: No changes.  Follow-up plan: ICM clinic phone appointment on9/08/2018.   Copy of ICM check sent to [place].   Direct trend viewer: 08/19/2018      [name], RN 08/23/2018 3:37 PM

## 2018-08-25 ENCOUNTER — Other Ambulatory Visit: Payer: Self-pay | Admitting: Internal Medicine

## 2018-09-13 ENCOUNTER — Ambulatory Visit (INDEPENDENT_AMBULATORY_CARE_PROVIDER_SITE_OTHER): Payer: Medicare HMO | Admitting: *Deleted

## 2018-09-13 DIAGNOSIS — I5022 Chronic systolic (congestive) heart failure: Secondary | ICD-10-CM

## 2018-09-13 DIAGNOSIS — I447 Left bundle-branch block, unspecified: Secondary | ICD-10-CM

## 2018-09-13 LAB — CUP PACEART REMOTE DEVICE CHECK
Battery Remaining Longevity: 59 mo
Battery Remaining Percentage: 80 %
Battery Voltage: 2.93 V
Brady Statistic AP VP Percent: 4.3 %
Brady Statistic AP VS Percent: 1 %
Brady Statistic AS VP Percent: 94 %
Brady Statistic AS VS Percent: 1.8 %
Brady Statistic RA Percent Paced: 4.2 %
Date Time Interrogation Session: 20200826081240
Implantable Lead Implant Date: 20161229
Implantable Lead Implant Date: 20161229
Implantable Lead Implant Date: 20161229
Implantable Lead Location: 753858
Implantable Lead Location: 753859
Implantable Lead Location: 753860
Implantable Pulse Generator Implant Date: 20161229
Lead Channel Impedance Value: 440 Ohm
Lead Channel Impedance Value: 450 Ohm
Lead Channel Impedance Value: 650 Ohm
Lead Channel Pacing Threshold Amplitude: 0.75 V
Lead Channel Pacing Threshold Amplitude: 0.75 V
Lead Channel Pacing Threshold Amplitude: 2 V
Lead Channel Pacing Threshold Pulse Width: 0.5 ms
Lead Channel Pacing Threshold Pulse Width: 0.5 ms
Lead Channel Pacing Threshold Pulse Width: 1.5 ms
Lead Channel Sensing Intrinsic Amplitude: 12 mV
Lead Channel Sensing Intrinsic Amplitude: 2.6 mV
Lead Channel Setting Pacing Amplitude: 1.75 V
Lead Channel Setting Pacing Amplitude: 2 V
Lead Channel Setting Pacing Amplitude: 2.5 V
Lead Channel Setting Pacing Pulse Width: 0.5 ms
Lead Channel Setting Pacing Pulse Width: 1.5 ms
Lead Channel Setting Sensing Sensitivity: 2 mV
Pulse Gen Model: 3262
Pulse Gen Serial Number: 7834528

## 2018-09-22 NOTE — Progress Notes (Signed)
Remote pacemaker transmission.   

## 2018-09-26 ENCOUNTER — Ambulatory Visit (INDEPENDENT_AMBULATORY_CARE_PROVIDER_SITE_OTHER): Payer: Medicare HMO

## 2018-09-26 DIAGNOSIS — I5022 Chronic systolic (congestive) heart failure: Secondary | ICD-10-CM

## 2018-09-26 DIAGNOSIS — Z950 Presence of cardiac pacemaker: Secondary | ICD-10-CM | POA: Diagnosis not present

## 2018-09-27 NOTE — Progress Notes (Signed)
EPIC Encounter for ICM Monitoring  Patient Name: Alexandra [name] is a 85 y.o. female Date: 09/27/2018 Primary Care Physican: Lawerance Cruel, MD Primary Cardiologist:Skains/Bensimhon Electrophysiologist: Vergie Living Pacing: 98% 08/01/2018 Weight: 164 lbs 09/27/2018 Weight: 170 lbs   Spoke with patient.  She said she is feeling fine.  She was on vacation mid July until about 1st week of August which correlates with decreased impedance.   CorvueThoracic impedancenormal but was suggesting possible fluid accumulation from ~mid July until mid August.  Prescribed:Furosemide 40 mgTake one tablet (40 mg) by mouth once every other day. May take extra as needed.Potassium 10 mEq 1 tablet daily.  LABS: 03/10/2018 Creatinine 1.61, BUN 27, Potassium 4.3, Sodium 138, GFR 28-33 01/27/2018 Creatinine 1.55, BUN 27, Potassium 4.8, Sodium 141, GFR 30-34 05/27/2017 Creatinine 1.41, BUN 24, Potassium 4.8, Sodium 139, GFR 32-38 01/27/2016 Creatinine 1.50, BUN 40, Potassium 3.8, Sodium 141, GFR 31-36  Recommendations: Reinforced limiting salt intake to < 2000 mg daily and fluid intake to 64 oz daily.  Encouraged to call if experiencing fluid symptoms.  Follow-up plan: ICM clinic phone appointment on 11/10/2018.   91 day device clinic remote transmission 12/13/2018.      Copy of ICM check sent to Dr. Caryl Comes.   3 month ICM trend: 09/26/2018    1 Year ICM trend:       [name], RN 09/27/2018 4:30 PM

## 2018-11-13 ENCOUNTER — Ambulatory Visit (INDEPENDENT_AMBULATORY_CARE_PROVIDER_SITE_OTHER): Payer: Medicare HMO

## 2018-11-13 DIAGNOSIS — Z950 Presence of cardiac pacemaker: Secondary | ICD-10-CM | POA: Diagnosis not present

## 2018-11-13 DIAGNOSIS — I5022 Chronic systolic (congestive) heart failure: Secondary | ICD-10-CM | POA: Diagnosis not present

## 2018-11-15 NOTE — Progress Notes (Signed)
EPIC Encounter for ICM Monitoring  Patient Name: Alexandra [name] is a 85 y.o. female Date: 11/15/2018 Primary Care Physican: Lawerance Cruel, MD Primary Cardiologist:Skains/Bensimhon Electrophysiologist: Vergie Living Pacing: 98% 09/27/2018 Weight: 170 lbs   Transmission reviewed.  CorvueThoracic impedancenormal.  Prescribed:Furosemide 40 mgTake one tablet (40 mg) by mouth once every other day. May take extra as needed.Potassium 10 mEq 1 tablet daily.  LABS: 03/10/2018 Creatinine 1.61, BUN 27, Potassium 4.3, Sodium 138, GFR 28-33 01/27/2018 Creatinine 1.55, BUN 27, Potassium 4.8, Sodium 141, GFR 30-34  Recommendations: None  Follow-up plan: ICM clinic phone appointment on 12/19/2018.  91 day device clinic remote transmission 12/18/2018.    Copy of ICM check sent to Dr. Caryl Comes.   3 month ICM trend: 11/10/2018    1 Year ICM trend:       [name], RN 11/15/2018 2:40 PM

## 2018-12-08 DIAGNOSIS — M79605 Pain in left leg: Secondary | ICD-10-CM | POA: Diagnosis not present

## 2018-12-08 DIAGNOSIS — N184 Chronic kidney disease, stage 4 (severe): Secondary | ICD-10-CM | POA: Diagnosis not present

## 2018-12-15 LAB — CUP PACEART REMOTE DEVICE CHECK
Battery Remaining Longevity: 59 mo
Battery Remaining Percentage: 80 %
Battery Voltage: 2.93 V
Brady Statistic AP VP Percent: 3.6 %
Brady Statistic AP VS Percent: 1 %
Brady Statistic AS VP Percent: 94 %
Brady Statistic AS VS Percent: 2.1 %
Brady Statistic RA Percent Paced: 3.5 %
Date Time Interrogation Session: 20201125020013
Implantable Lead Implant Date: 20161229
Implantable Lead Implant Date: 20161229
Implantable Lead Implant Date: 20161229
Implantable Lead Location: 753858
Implantable Lead Location: 753859
Implantable Lead Location: 753860
Implantable Pulse Generator Implant Date: 20161229
Lead Channel Impedance Value: 430 Ohm
Lead Channel Impedance Value: 430 Ohm
Lead Channel Impedance Value: 680 Ohm
Lead Channel Pacing Threshold Amplitude: 0.75 V
Lead Channel Pacing Threshold Amplitude: 0.75 V
Lead Channel Pacing Threshold Amplitude: 2 V
Lead Channel Pacing Threshold Pulse Width: 0.5 ms
Lead Channel Pacing Threshold Pulse Width: 0.5 ms
Lead Channel Pacing Threshold Pulse Width: 1.5 ms
Lead Channel Sensing Intrinsic Amplitude: 12 mV
Lead Channel Sensing Intrinsic Amplitude: 2.6 mV
Lead Channel Setting Pacing Amplitude: 1.75 V
Lead Channel Setting Pacing Amplitude: 2 V
Lead Channel Setting Pacing Amplitude: 2.5 V
Lead Channel Setting Pacing Pulse Width: 0.5 ms
Lead Channel Setting Pacing Pulse Width: 1.5 ms
Lead Channel Setting Sensing Sensitivity: 2 mV
Pulse Gen Model: 3262
Pulse Gen Serial Number: 7834528

## 2018-12-18 ENCOUNTER — Ambulatory Visit (INDEPENDENT_AMBULATORY_CARE_PROVIDER_SITE_OTHER): Payer: Medicare HMO | Admitting: *Deleted

## 2018-12-18 DIAGNOSIS — I5022 Chronic systolic (congestive) heart failure: Secondary | ICD-10-CM | POA: Diagnosis not present

## 2018-12-19 ENCOUNTER — Telehealth: Payer: Self-pay

## 2018-12-19 NOTE — Telephone Encounter (Signed)
Left message for patient to remind of missed remote transmission.  

## 2018-12-20 DIAGNOSIS — N184 Chronic kidney disease, stage 4 (severe): Secondary | ICD-10-CM | POA: Diagnosis not present

## 2018-12-20 DIAGNOSIS — M79606 Pain in leg, unspecified: Secondary | ICD-10-CM | POA: Diagnosis not present

## 2018-12-25 NOTE — Progress Notes (Signed)
No ICM remote transmission received for 12/19/2018 and next ICM transmission scheduled for 01/29/2019.

## 2018-12-27 ENCOUNTER — Encounter: Payer: Self-pay | Admitting: Orthopaedic Surgery

## 2018-12-27 ENCOUNTER — Other Ambulatory Visit: Payer: Self-pay

## 2018-12-27 ENCOUNTER — Telehealth: Payer: Self-pay | Admitting: Orthopaedic Surgery

## 2018-12-27 ENCOUNTER — Ambulatory Visit (INDEPENDENT_AMBULATORY_CARE_PROVIDER_SITE_OTHER): Payer: Medicare HMO

## 2018-12-27 ENCOUNTER — Ambulatory Visit (INDEPENDENT_AMBULATORY_CARE_PROVIDER_SITE_OTHER): Payer: Medicare HMO | Admitting: Orthopaedic Surgery

## 2018-12-27 DIAGNOSIS — M7062 Trochanteric bursitis, left hip: Secondary | ICD-10-CM | POA: Diagnosis not present

## 2018-12-27 DIAGNOSIS — M5442 Lumbago with sciatica, left side: Secondary | ICD-10-CM

## 2018-12-27 MED ORDER — LIDOCAINE HCL 1 % IJ SOLN
3.0000 mL | INTRAMUSCULAR | Status: AC | PRN
  Administered 2018-12-27: 3 mL

## 2018-12-27 MED ORDER — METHYLPREDNISOLONE ACETATE 40 MG/ML IJ SUSP
40.0000 mg | INTRAMUSCULAR | Status: AC | PRN
  Administered 2018-12-27: 15:00:00 40 mg via INTRA_ARTICULAR

## 2018-12-27 NOTE — Telephone Encounter (Signed)
Patient called and stated that she could do PT at [facility] on [address] near her home  Please call patient back [phone]

## 2018-12-27 NOTE — Progress Notes (Signed)
Office Visit Note   Patient: Alexandra [name]           Date of Birth: Apr 01, 1927           MRN: [id number] Visit Date: 12/27/2018              Requested by: Lawerance Cruel, [place],  [place] [id number] PCP: Lawerance Cruel, MD   Assessment & Plan: Visit Diagnoses:  1. Acute left-sided low back pain with left-sided sciatica   2. Trochanteric bursitis, left hip     Plan: I do feel that she would benefit from formal outpatient physical therapy to work on any modalities that can improve her balance and coordination.  This would likely involve lower extremity strengthening and core strengthening.  I did provide injection per her wishes over the left hip trochanteric area.  I recommended Voltaren gel to try as well.  All questions and concerns were answered and addressed.  We will work on setting up outpatient therapy.  Would like to see her back in 6 weeks to see how she is doing overall.  Follow-Up Instructions: Return in about 6 weeks (around 02/07/2019), or if symptoms worsen or fail to improve.   Orders:  Orders Placed This Encounter  Procedures  . Large Joint Inj  . XR Lumbar Spine 2-3 Views   No orders of the defined types were placed in this encounter.     Procedures: Large Joint Inj: L greater trochanter on 12/27/2018 2:30 PM Indications: pain and diagnostic evaluation Details: 22 G 1.5 in needle, lateral approach  Arthrogram: No  Medications: 3 mL lidocaine 1 %; 40 mg methylPREDNISolone acetate 40 MG/ML Outcome: tolerated well, no immediate complications Procedure, treatment alternatives, risks and benefits explained, specific risks discussed. Consent was given by the patient. Immediately prior to procedure a time out was called to verify the correct patient, procedure, equipment, support staff and site/side marked as required. Patient was prepped and draped in the usual sterile fashion.       Clinical Data: No additional findings.    Subjective: Chief Complaint  Patient presents with  . Left Leg - Pain  Patient comes in today with left lower extremity pain which seems to be from her back down to her foot patient more recently just on the side of her hip to her knee.  She says she cannot sleep on that side at night.  She does state that a year ago I did provide an injection in her right hip for trochanteric bursitis.  She ambulates with a cane.  She is 85 years old.  She is requesting outpatient physical therapy to work on her strengthening as well as balance and coordination for lower extremities because she feels like she is becoming a fall risk.  She does use a cane when she ambulates.  She denies any groin pain.  Today she denies any pain past her knee.  Is only on the side of her hip.  She denies any numbness and tingling in her feet.  HPI  Review of Systems She currently denies any headache, chest pain, shortness of breath, fever, chills, nausea, vomiting  Objective: Vital Signs: There were no vitals taken for this visit.  Physical Exam She is alert and orient x3 and in no acute distress Ortho Exam Examination of her left lower extremity shows a negative straight leg raise.  She has full range of motion of her knee and hip.  She does have weakness  in general in her lower extremities when assessing both knees bilaterally.  There is pain only to palpation of the trochanteric area on the left hip. Specialty Comments:  No specialty comments available.  Imaging: Xr Lumbar Spine 2-3 Views  Result Date: 12/27/2018 An AP and lateral lumbar spine shows degenerative changes at multiple levels with significant osteophytes around the spine.  The top of both hips can be seen showed no significant arthritis in her hips.    PMFS History: Patient Active Problem List   Diagnosis Date Noted  . Unilateral primary osteoarthritis, right knee 07/04/2017  . Dyspnea   . Chronic systolic heart failure ([place])   . Pain in left shin  02/07/2015  . Leukocytosis 01/31/2015  . Chronic systolic CHF (congestive heart failure), NYHA class 3 ([place]) 01/16/2015  . Protein-calorie malnutrition, severe ([place]) 01/15/2015  . Infection due to Corynebacterium diphtheriae 12/24/2014  . Bacteremia   . Fever 12/19/2014  . Chronic systolic CHF (congestive heart failure), NYHA class 4 ([place]) 10/23/2014  . Situational depression   . Frequent headaches   . Pulmonary embolism ([place]) 10/10/2014  . Chronic anticoagulation 10/10/2014  . Takotsubo syndrome 08/19/2014  . HCAP (healthcare-associated pneumonia) 08/17/2014  . H/O Asthma 08/16/2014  . LBBB (left bundle branch block) 08/16/2014  . Cardiomyopathy- etiology not yet determined 08/16/2014  . PSVT (paroxysmal supraventricular tachycardia) ([place]) 08/16/2014  . CKD (chronic kidney disease) stage 3, GFR 30-59 ml/min 08/13/2014  . Anemia of chronic disease 08/13/2014  . Hypothyroidism 08/13/2014  . Acute cholecystitis 08/11/2014  . Abdominal pain 08/11/2014  . Essential (primary) hypertension 04/02/2014   Past Medical History:  Diagnosis Date  . Adult hypothyroidism 04/02/2014  . Anemia of chronic disease 08/13/2014  . Anxiety   . Arthritis   . Asthma   . breast ca dx'd 2000   left  . CAP (community acquired pneumonia) 12/19/2012  . Chest pain    NM stress test 05/2011 Normal  . CHF (congestive heart failure) ([place]) 08/19/2014  . CKD (chronic kidney disease) stage 3, GFR 30-59 ml/min 08/13/2014  . Depression   . DOE (dyspnea on exertion)    No SOB at rest  . Essential (primary) hypertension 04/02/2014  . Fatigue    excertional  . VEHMCNOB(096.2)   . Hereditary and idiopathic peripheral neuropathy 07/02/2014  . Hypercholesteremia   . Intermittent LBBB (left bundle branch block) 12/21/2012  . Leg weakness   . Neuropathy   . Obesity   . Osteopenia   . Peripheral neuropathy   . Presence of permanent cardiac pacemaker 01/16/2015   BIV  . Sacroiliitis, not elsewhere classified ([place])    . Situational depression   . Trigger finger    [name]    Family History  Problem Relation Age of Onset  . Pneumonia Mother 34       cause of death  . Diabetes Brother   . Pancreatic cancer Father 4  . Alzheimer's disease Sister   . CVA Daughter        01/21/09  . Stroke Daughter   . Hypertension Daughter   . Heart attack Neg Hx     Past Surgical History:  Procedure Laterality Date  . ACHILLES TENDON SURGERY    . BREAST LUMPECTOMY     breast cancer  . CARDIAC CATHETERIZATION N/A 07/21/2015   Procedure: Right Heart Cath;  Surgeon: Jolaine Artist, MD;  Location: [facility];  Service: Cardiovascular;  Laterality: N/A;  . CHOLECYSTECTOMY N/A 08/13/2014   Procedure:  LAPAROSCOPIC CHOLECYSTECTOMY;  Surgeon: Stark Klein, MD;  Location: [facility];  Service: General;  Laterality: N/A;  . EP IMPLANTABLE DEVICE N/A 01/16/2015   Procedure: BiV Pacemaker Insertion CRT-P;  Surgeon: Deboraha Sprang, MD;  Location: [facility];  Service: Cardiovascular;  Laterality: N/A;  . LEG SURGERY    . LUMBAR LAMINECTOMY    . POLYPECTOMY    . TONSILLECTOMY    . VESICOVAGINAL FISTULA CLOSURE W/ TAH     DC hysterectomy   Social History   Occupational History  . Occupation: Retired  Tobacco Use  . Smoking status: Never Smoker  . Smokeless tobacco: Never Used  Substance and Sexual Activity  . Alcohol use: No    Alcohol/week: 0.0 standard drinks  . Drug use: No  . Sexual activity: Never

## 2018-12-27 NOTE — Telephone Encounter (Signed)
Patient stated at check out that she would rather attend PT at our office.  CB#[phone].  Thank you.

## 2018-12-28 ENCOUNTER — Other Ambulatory Visit: Payer: Self-pay

## 2018-12-28 DIAGNOSIS — M7062 Trochanteric bursitis, left hip: Secondary | ICD-10-CM

## 2018-12-28 DIAGNOSIS — M25551 Pain in right hip: Secondary | ICD-10-CM

## 2018-12-28 DIAGNOSIS — M5442 Lumbago with sciatica, left side: Secondary | ICD-10-CM

## 2018-12-28 DIAGNOSIS — M1711 Unilateral primary osteoarthritis, right knee: Secondary | ICD-10-CM

## 2018-12-28 NOTE — Telephone Encounter (Signed)
Order put in.

## 2019-01-04 DIAGNOSIS — M179 Osteoarthritis of knee, unspecified: Secondary | ICD-10-CM | POA: Diagnosis not present

## 2019-01-04 DIAGNOSIS — I10 Essential (primary) hypertension: Secondary | ICD-10-CM | POA: Diagnosis not present

## 2019-01-04 DIAGNOSIS — E039 Hypothyroidism, unspecified: Secondary | ICD-10-CM | POA: Diagnosis not present

## 2019-01-04 DIAGNOSIS — I509 Heart failure, unspecified: Secondary | ICD-10-CM | POA: Diagnosis not present

## 2019-01-04 DIAGNOSIS — D649 Anemia, unspecified: Secondary | ICD-10-CM | POA: Diagnosis not present

## 2019-01-04 DIAGNOSIS — N184 Chronic kidney disease, stage 4 (severe): Secondary | ICD-10-CM | POA: Diagnosis not present

## 2019-01-04 DIAGNOSIS — J4520 Mild intermittent asthma, uncomplicated: Secondary | ICD-10-CM | POA: Diagnosis not present

## 2019-01-04 DIAGNOSIS — Z853 Personal history of malignant neoplasm of breast: Secondary | ICD-10-CM | POA: Diagnosis not present

## 2019-01-10 NOTE — Progress Notes (Signed)
PPM remote 

## 2019-01-29 ENCOUNTER — Ambulatory Visit (INDEPENDENT_AMBULATORY_CARE_PROVIDER_SITE_OTHER): Payer: Medicare HMO

## 2019-01-29 DIAGNOSIS — I5022 Chronic systolic (congestive) heart failure: Secondary | ICD-10-CM

## 2019-01-29 DIAGNOSIS — Z950 Presence of cardiac pacemaker: Secondary | ICD-10-CM | POA: Diagnosis not present

## 2019-02-02 NOTE — Progress Notes (Signed)
EPIC Encounter for ICM Monitoring  Patient Name: Alexandra [name] is a 86 y.o. female Date: 02/02/2019 Primary Care Physican: Lawerance Cruel, MD Primary Cardiologist:Skains/Bensimhon Electrophysiologist: Vergie Living Pacing: 98% 02/02/2019 Weight: 170 lbs   Spoke with patient and she is doing well.  Voices no complaints.  CorvueThoracic impedancenormal.  Prescribed:Furosemide 40 mgTake one tablet (40 mg) by mouth once every other day. May take extra as needed.Potassium 10 mEq 1 tablet daily.  LABS: 08/04/2018 Creatinine 1.79, BUN 34, Potassium 4.0, Sodium 137, GFR 25-29 03/10/2018 Creatinine 1.61, BUN 27, Potassium 4.3, Sodium 138, GFR 28-33 01/27/2018 Creatinine 1.55, BUN 27, Potassium 4.8, Sodium 141, GFR 30-34  Recommendations:  No changes and encouraged to call if experiencing any fluid symptoms.  Follow-up plan: ICM clinic phone appointment on 03/05/2019.  91 day device clinic remote transmission 03/19/2019.  Office visit with Dr Caryl Comes 02/13/2019.  Copy of ICM check sent to Dr. Caryl Comes.   3 month ICM trend: 01/29/2019    1 Year ICM trend:       [name], RN 02/02/2019 1:12 PM

## 2019-02-07 ENCOUNTER — Ambulatory Visit: Payer: Medicare HMO | Admitting: Orthopaedic Surgery

## 2019-02-07 DIAGNOSIS — N184 Chronic kidney disease, stage 4 (severe): Secondary | ICD-10-CM | POA: Diagnosis not present

## 2019-02-07 DIAGNOSIS — K219 Gastro-esophageal reflux disease without esophagitis: Secondary | ICD-10-CM | POA: Diagnosis not present

## 2019-02-07 DIAGNOSIS — I428 Other cardiomyopathies: Secondary | ICD-10-CM | POA: Diagnosis not present

## 2019-02-09 ENCOUNTER — Ambulatory Visit: Payer: Medicare HMO | Attending: Family Medicine

## 2019-02-09 ENCOUNTER — Telehealth: Payer: Self-pay | Admitting: Internal Medicine

## 2019-02-09 DIAGNOSIS — Z23 Encounter for immunization: Secondary | ICD-10-CM

## 2019-02-09 NOTE — Telephone Encounter (Signed)
New Message    Pt is calling and says she will need someone to come with her to her appt because she uses a wheel chair    Please advise

## 2019-02-09 NOTE — Telephone Encounter (Signed)
Advised the pt she is fine to have her son come help her with her appt. Her son Broadus John will be coming with her. Pt thanked me for the call and the help.

## 2019-02-09 NOTE — Progress Notes (Signed)
   Covid-19 Vaccination Clinic  Name:  Alexandra [name]    MRN: [id number] DOB: December 17, 1927  02/09/2019  [name] was observed post Covid-19 immunization for 15 minutes without incidence. She was provided with Vaccine Information Sheet and instruction to access the V-Safe system.   [name] was instructed to call 911 with any severe reactions post vaccine: Marland Kitchen Difficulty breathing  . Swelling of your face and throat  . A fast heartbeat  . A bad rash all over your body  . Dizziness and weakness    Immunizations Administered    Name Date Dose VIS Date Route   Pfizer COVID-19 Vaccine 02/09/2019 12:58 PM 0.3 mL 12/29/2018 Intramuscular   Manufacturer: [place]   Lot: [id number]   [place]: [address]-4

## 2019-02-13 ENCOUNTER — Ambulatory Visit: Payer: Medicare HMO | Admitting: Internal Medicine

## 2019-02-13 ENCOUNTER — Other Ambulatory Visit: Payer: Self-pay

## 2019-02-13 ENCOUNTER — Encounter: Payer: Self-pay | Admitting: Internal Medicine

## 2019-02-13 VITALS — BP 128/64 | HR 81 | Ht 64.5 in | Wt 173.0 lb

## 2019-02-13 DIAGNOSIS — I429 Cardiomyopathy, unspecified: Secondary | ICD-10-CM | POA: Diagnosis not present

## 2019-02-13 DIAGNOSIS — I447 Left bundle-branch block, unspecified: Secondary | ICD-10-CM

## 2019-02-13 DIAGNOSIS — I471 Supraventricular tachycardia: Secondary | ICD-10-CM | POA: Diagnosis not present

## 2019-02-13 DIAGNOSIS — I5022 Chronic systolic (congestive) heart failure: Secondary | ICD-10-CM

## 2019-02-13 NOTE — Progress Notes (Signed)
Patient Care Team: Lawerance Cruel, MD as PCP - General (Family Medicine) Deboraha Sprang, MD as Consulting Physician (Cardiology)   HPI  Alexandra [name] is a 86 y.o. female Seen following  CRT-p  Implant   She has a history of nonischemic cardiomyopathy and left bundle branch block. It was initially thought to be related to [place] when she presented 7/16 EF at that time was 20%. She was admitted 9/16 with low output and was treated with milrinone,  down titration of which was required because of tachycardia. 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  She required outpatient milrinone area and this was ultimately stopped 2/17. She underwent right heart catheterization 7/17 with outstanding numbers. Low filling pressures.    She originally did not respond significantly to  CRT. We reprogrammed her AV delays and underwent AV optimization by echo  The patient denies chest pain,  nocturnal dyspnea, or peripheral edema; stable 2 pillow orthopnea and modest increasing dyspnea with exertion .  There have been no palpitations , lightheadedness  or syncope  .   DATE TEST EF   9/16 Echo    15 %   2/17 Echo  40-45 %   11/18 Echo  45-50%       Date Cr K Hgb  1/18 1.5 3.8 12.2  11/18 1.46 4.4   5/19 1.41 4.8     7/20 1..79 4.0 13.4  12/20 1.6>>1.5 4.0      Blood pressure has been reasonably controlled.  Past Medical History:  Diagnosis Date  . Adult hypothyroidism 04/02/2014  . Anemia of chronic disease 08/13/2014  . Anxiety   . Arthritis   . Asthma   . breast ca dx'd 2000   left  . CAP (community acquired pneumonia) 12/19/2012  . Chest pain    NM stress test 05/2011 Normal  . CHF (congestive heart failure) ([place]) 08/19/2014  . CKD (chronic kidney disease) stage 3, GFR 30-59 ml/min 08/13/2014  . Depression   . DOE (dyspnea on exertion)    No SOB at rest  . Essential  (primary) hypertension 04/02/2014  . Fatigue    excertional  . KPTWSFKC(127.5)   . Hereditary and idiopathic peripheral neuropathy 07/02/2014  . Hypercholesteremia   . Intermittent LBBB (left bundle branch block) 12/21/2012  . Leg weakness   . Neuropathy   . Obesity   . Osteopenia   . Peripheral neuropathy   . Presence of permanent cardiac pacemaker 01/16/2015   BIV  . Sacroiliitis, not elsewhere classified ([place])   . Situational depression   . Trigger finger    [name]    Past Surgical History:  Procedure Laterality Date  . ACHILLES TENDON SURGERY    . BREAST LUMPECTOMY     breast cancer  . CARDIAC CATHETERIZATION N/A 07/21/2015   Procedure: Right Heart Cath;  Surgeon: Jolaine Artist, MD;  Location: [facility];  Service: Cardiovascular;  Laterality: N/A;  . CHOLECYSTECTOMY N/A 08/13/2014   Procedure: LAPAROSCOPIC CHOLECYSTECTOMY;  Surgeon: Stark Amish Mintzer, MD;  Location: [facility];  Service: General;  Laterality: N/A;  . EP IMPLANTABLE DEVICE N/A 01/16/2015   Procedure: BiV Pacemaker Insertion CRT-P;  Surgeon: Deboraha Sprang, MD;  Location: [facility];  Service: Cardiovascular;  Laterality: N/A;  . LEG SURGERY    . LUMBAR LAMINECTOMY    . POLYPECTOMY    . TONSILLECTOMY    .  VESICOVAGINAL FISTULA CLOSURE W/ TAH     DC hysterectomy    Current Outpatient Medications  Medication Sig Dispense Refill  . acetaminophen (TYLENOL) 500 MG tablet Take 500 mg by mouth every 6 (six) hours as needed for mild pain or headache. Reported on 06/17/2015    . albuterol (PROVENTIL HFA;VENTOLIN HFA) 108 (90 BASE) MCG/ACT inhaler Inhale 2 puffs into the lungs every 6 (six) hours as needed for wheezing or shortness of breath. Reported on 05/27/2015    . ALPRAZolam (XANAX) 0.25 MG tablet Take 0.25 mg by mouth 3 (three) times daily as needed for anxiety. Reported on 05/27/2015    . amoxicillin (AMOXIL) 500 MG capsule Take 4 capsules by mouth as needed 1 hour before dental work (Patient not  taking: Reported on 03/10/2018) 4 capsule prn  . b complex vitamins tablet Take 1 tablet by mouth daily with lunch.     . calcium carbonate (TUMS - DOSED IN MG ELEMENTAL CALCIUM) 500 MG chewable tablet Chew 2 tablets by mouth daily as needed for indigestion or heartburn. Reported on 07/18/2015    . cholecalciferol (VITAMIN D) 1000 UNITS tablet Take 1,000 Units by mouth daily with lunch.     . citalopram (CELEXA) 20 MG tablet TAKE 1 TABLET(20 MG) BY MOUTH DAILY 30 tablet 0  . ELIQUIS 2.5 MG TABS tablet TAKE 1 TABLET(2.5 MG) BY MOUTH TWICE DAILY 60 tablet 9  . famotidine (PEPCID) 20 MG tablet Take 20 mg by mouth daily.    . ferrous sulfate 325 (65 FE) MG tablet Take 325 mg by mouth daily with breakfast.    . furosemide (LASIX) 40 MG tablet Take one tablet (40 mg) by mouth once every other day. May take extra as needed. 30 tablet 3  . hydrALAZINE (APRESOLINE) 25 MG tablet Take 1 tablet (25 mg total) by mouth 2 (two) times daily. 270 tablet 0  . lactose free nutrition (BOOST) LIQD Take 237 mLs by mouth daily with breakfast.     . levothyroxine (SYNTHROID, LEVOTHROID) 25 MCG tablet Take 25 mcg by mouth daily before breakfast. for thyroid    . MAGNESIUM-OXIDE 400 (241.3 Mg) MG tablet TAKE 1/2 TABLET(200 MG) BY MOUTH DAILY 15 tablet 11  . Multiple Vitamin (MULITIVITAMIN WITH MINERALS) TABS Take 1 tablet by mouth daily with lunch.     . nitroGLYCERIN (NITROSTAT) 0.4 MG SL tablet Place 1 tablet (0.4 mg total) under the tongue every 5 (five) minutes as needed for chest pain. 25 tablet 4  . Polyethyl Glycol-Propyl Glycol (SYSTANE OP) Place 1 drop into both eyes 2 (two) times daily.    . potassium chloride (K-DUR) 10 MEQ tablet TAKE 1 TABLET BY MOUTH DAILY 90 tablet 2  . spironolactone (ALDACTONE) 25 MG tablet Take 0.5 tablets (12.5 mg total) by mouth once for 1 dose. (Patient taking differently: Take 12.5 mg by mouth daily. ) 90 tablet 0   No current facility-administered medications for this visit.     Allergies  Allergen Reactions  . Amiodarone Shortness Of Breath and Nausea And Vomiting  . Cymbalta [Duloxetine Hcl] Other (See Comments)    Depression  . Lyrica [Pregabalin] Other (See Comments)    Blurry vision      Review of Systems negative except from HPI and PMH  Physical Exam  BP 128/64   Pulse 81   Ht 5' 4.5" (1.638 m)   Wt 173 lb (78.5 kg)   SpO2 99%   BMI 29.24 kg/m   Well developed and well nourished  in no acute distress HENT normal Neck supple with JVP-flat Clear Device pocket well healed; without hematoma or erythema.  There is no tethering  Regular rate and rhythm, no murmur Abd-soft with active BS No Clubbing cyanosis   edema Skin-warm and dry A & Oriented  Grossly normal sensory and motor function  ECG SINUS  w P-synchronous/ AV  pacing  QRS is upright in lead V1 and is Q are in lead I Intervals 15/13/43   Assessment and  Plan Nonischemic cardiomyopathy  Congestive heart failure-chronic-systolic  ICD-CRT-D-St. Jude's  The patient's device was interrogated and the information was fully reviewed.  The device was reprogrammed to pace between the by pulse 1-2 as opposed to using the RV ring as there was an oral stimulation  Renal dysfunction gd 3   Atrial Tachycardia  High Risk Medication Surveillance Aldactone   GFR calculates out to 34 even with a higher GFR 12/20.  We will continue her on spironolactone.  She is euvolemic.

## 2019-02-13 NOTE — Patient Instructions (Signed)

## 2019-02-16 ENCOUNTER — Telehealth: Payer: Self-pay | Admitting: Internal Medicine

## 2019-02-16 NOTE — Telephone Encounter (Signed)
Pt scheduled for 2/3 9:40am with AT

## 2019-02-16 NOTE — Telephone Encounter (Signed)
Spoke with pt.  She reports increased fatigue since her OV on 1/26.  During visit MD noted- "ICD-CRT-D-St. Jude's  The patient's device was interrogated and the information was fully reviewed.  The device was reprogrammed to pace between the by pulse 1-2 as opposed to using the RV ring as there was an oral stimulation"   Assisted pt with sending manual transmission.  Once report reviewed will let her know of recommendations.

## 2019-02-16 NOTE — Telephone Encounter (Signed)
Manual transmission received.  Presenting rhythm appears appropriate BiV capture.  Spoke with pt offered appt with AT on day when Dr. Caryl Comes in office for potential LV vector optimization.

## 2019-02-16 NOTE — Telephone Encounter (Signed)
New message  Patient states that since she has came into the office on 02/13/19 and had her device checked, she states that she has been fatigues and very sleepy since then. States that she is having to take naps and is sleeping until the afternoon now. Please give patient a call back to advise.

## 2019-02-21 ENCOUNTER — Encounter: Payer: Medicare HMO | Admitting: Student

## 2019-03-03 ENCOUNTER — Ambulatory Visit: Payer: Medicare HMO | Attending: Internal Medicine

## 2019-03-03 DIAGNOSIS — Z23 Encounter for immunization: Secondary | ICD-10-CM | POA: Insufficient documentation

## 2019-03-03 NOTE — Progress Notes (Signed)
   Covid-19 Vaccination Clinic  Name:  Alexandra [name]    MRN: [id number] DOB: [birth year]  03/03/2019  [name] was observed post Covid-19 immunization for 15 minutes without incidence. She was provided with Vaccine Information Sheet and instruction to access the V-Safe system.   [name] was instructed to call 911 with any severe reactions post vaccine: Marland Kitchen Difficulty breathing  . Swelling of your face and throat  . A fast heartbeat  . A bad rash all over your body  . Dizziness and weakness    Immunizations Administered    Name Date Dose VIS Date Route   Pfizer COVID-19 Vaccine 03/03/2019 12:38 PM 0.3 mL 12/29/2018 Intramuscular   Manufacturer: [place]   Lot: [id number]   [place]: [address]-0

## 2019-03-05 ENCOUNTER — Ambulatory Visit (INDEPENDENT_AMBULATORY_CARE_PROVIDER_SITE_OTHER): Payer: Medicare HMO

## 2019-03-05 DIAGNOSIS — I5022 Chronic systolic (congestive) heart failure: Secondary | ICD-10-CM | POA: Diagnosis not present

## 2019-03-05 DIAGNOSIS — Z950 Presence of cardiac pacemaker: Secondary | ICD-10-CM | POA: Diagnosis not present

## 2019-03-07 ENCOUNTER — Telehealth: Payer: Self-pay

## 2019-03-07 NOTE — Telephone Encounter (Signed)
Remote ICM transmission received.  Attempted call to patient regarding ICM remote transmission and left detailed message per DPR.  Advised to return call for any fluid symptoms or questions.  

## 2019-03-07 NOTE — Progress Notes (Signed)
EPIC Encounter for ICM Monitoring  Patient Name: Alexandra [name] is a 86 y.o. female Date: 03/07/2019 Primary Care Physican: Lawerance Cruel, MD Primary Cardiologist:Skains/Bensimhon Electrophysiologist: Vergie Living Pacing: 97% 02/02/2019 Weight: 170 lbs   Attempted call to patient and unable to reach.  Left detailed message per DPR regarding transmission. Transmission reviewed.   CorvueThoracic impedancesuggesting possible fluid accumulation since 03/03/2019.  Prescribed:Furosemide 40 mgTake one tablet (40 mg) by mouth once every other day. May take extra as needed.Potassium 10 mEq 1 tablet daily.  LABS: 08/04/2018 Creatinine 1.79, BUN 34, Potassium 4.0, Sodium 137, GFR 25-29 03/10/2018 Creatinine 1.61, BUN 27, Potassium 4.3, Sodium 138, GFR 28-33 01/27/2018 Creatinine 1.55, BUN 27, Potassium 4.8, Sodium 141, GFR 30-34  Recommendations: Left voice mail with ICM number and encouraged to call if experiencing any fluid symptoms.  Will advise to take extra Furosemide if patient reached.  Follow-up plan: ICM clinic phone appointment on2/22/2021 (manual send) to recheck fluid levels.91 day device clinic remote transmission3/01/2019. Office visit with Dr Caryl Comes 02/13/2019.  Copy of ICM check sent to Dr.Klein and Dr Haroldine Laws.   3 month ICM trend: 03/05/2019    1 Year ICM trend:       [name], RN 03/07/2019 12:28 PM

## 2019-03-12 ENCOUNTER — Other Ambulatory Visit (HOSPITAL_COMMUNITY): Payer: Self-pay

## 2019-03-12 ENCOUNTER — Ambulatory Visit (INDEPENDENT_AMBULATORY_CARE_PROVIDER_SITE_OTHER): Payer: Medicare HMO

## 2019-03-12 DIAGNOSIS — I5022 Chronic systolic (congestive) heart failure: Secondary | ICD-10-CM

## 2019-03-12 DIAGNOSIS — Z950 Presence of cardiac pacemaker: Secondary | ICD-10-CM

## 2019-03-12 MED ORDER — APIXABAN 2.5 MG PO TABS: ORAL_TABLET | ORAL | 4 refills | Status: DC

## 2019-03-13 NOTE — Progress Notes (Addendum)
EPIC Encounter for ICM Monitoring  Patient Name: Alexandra [name] is a 86 y.o. female Date: 03/13/2019 Primary Care Physican: Lawerance Cruel, MD Primary Cardiologist:Skains/Bensimhon Electrophysiologist: Vergie Living Pacing: 97% 1/15/2021Weight: 170 lbs   Transmission reviewed.   CorvueThoracic impedancereturned to normal since 2/15 remote transmission.  Prescribed:Furosemide 40 mgTake one tablet (40 mg) by mouth once every other day. May take extra as needed.Potassium 10 mEq 1 tablet daily.  LABS: 08/04/2018 Creatinine 1.79, BUN 34, Potassium 4.0, Sodium 137, GFR 25-29 03/10/2018 Creatinine 1.61, BUN 27, Potassium 4.3, Sodium 138, GFR 28-33 01/27/2018 Creatinine 1.55, BUN 27, Potassium 4.8, Sodium 141, GFR 30-34  Recommendations:None  Follow-up plan: ICM clinic phone appointment on3/22/2021.91 day device clinic remote transmission3/01/2019.  Copy of ICM check sent to Dr.Klein and Dr Haroldine Laws.      [name], RN 03/13/2019 4:25 PM

## 2019-03-16 DIAGNOSIS — N184 Chronic kidney disease, stage 4 (severe): Secondary | ICD-10-CM | POA: Diagnosis not present

## 2019-03-16 DIAGNOSIS — Z853 Personal history of malignant neoplasm of breast: Secondary | ICD-10-CM | POA: Diagnosis not present

## 2019-03-16 DIAGNOSIS — M179 Osteoarthritis of knee, unspecified: Secondary | ICD-10-CM | POA: Diagnosis not present

## 2019-03-16 DIAGNOSIS — I10 Essential (primary) hypertension: Secondary | ICD-10-CM | POA: Diagnosis not present

## 2019-03-16 DIAGNOSIS — E039 Hypothyroidism, unspecified: Secondary | ICD-10-CM | POA: Diagnosis not present

## 2019-03-16 DIAGNOSIS — I509 Heart failure, unspecified: Secondary | ICD-10-CM | POA: Diagnosis not present

## 2019-03-16 DIAGNOSIS — J4520 Mild intermittent asthma, uncomplicated: Secondary | ICD-10-CM | POA: Diagnosis not present

## 2019-03-16 DIAGNOSIS — D649 Anemia, unspecified: Secondary | ICD-10-CM | POA: Diagnosis not present

## 2019-03-19 ENCOUNTER — Ambulatory Visit (INDEPENDENT_AMBULATORY_CARE_PROVIDER_SITE_OTHER): Payer: Medicare HMO | Admitting: *Deleted

## 2019-03-19 DIAGNOSIS — I5022 Chronic systolic (congestive) heart failure: Secondary | ICD-10-CM

## 2019-03-19 LAB — CUP PACEART REMOTE DEVICE CHECK
Battery Remaining Longevity: 61 mo
Battery Remaining Percentage: 71 %
Battery Voltage: 2.92 V
Brady Statistic AP VP Percent: 1 %
Brady Statistic AP VS Percent: 1 %
Brady Statistic AS VP Percent: 97 %
Brady Statistic AS VS Percent: 2.8 %
Brady Statistic RA Percent Paced: 1 %
Date Time Interrogation Session: 20210301023345
Implantable Lead Implant Date: 20161229
Implantable Lead Implant Date: 20161229
Implantable Lead Implant Date: 20161229
Implantable Lead Location: 753858
Implantable Lead Location: 753859
Implantable Lead Location: 753860
Implantable Pulse Generator Implant Date: 20161229
Lead Channel Impedance Value: 1075 Ohm
Lead Channel Impedance Value: 430 Ohm
Lead Channel Impedance Value: 440 Ohm
Lead Channel Pacing Threshold Amplitude: 0.75 V
Lead Channel Pacing Threshold Amplitude: 0.75 V
Lead Channel Pacing Threshold Amplitude: 0.75 V
Lead Channel Pacing Threshold Pulse Width: 0.5 ms
Lead Channel Pacing Threshold Pulse Width: 0.5 ms
Lead Channel Pacing Threshold Pulse Width: 1.5 ms
Lead Channel Sensing Intrinsic Amplitude: 12 mV
Lead Channel Sensing Intrinsic Amplitude: 2.6 mV
Lead Channel Setting Pacing Amplitude: 1.75 V
Lead Channel Setting Pacing Amplitude: 2 V
Lead Channel Setting Pacing Amplitude: 2 V
Lead Channel Setting Pacing Pulse Width: 0.5 ms
Lead Channel Setting Pacing Pulse Width: 1.5 ms
Lead Channel Setting Sensing Sensitivity: 2 mV
Pulse Gen Model: 3262
Pulse Gen Serial Number: 7834528

## 2019-03-19 NOTE — Progress Notes (Signed)
PPM Remote  

## 2019-03-29 DIAGNOSIS — M5430 Sciatica, unspecified side: Secondary | ICD-10-CM | POA: Diagnosis not present

## 2019-04-03 DIAGNOSIS — Z1211 Encounter for screening for malignant neoplasm of colon: Secondary | ICD-10-CM | POA: Diagnosis not present

## 2019-04-09 ENCOUNTER — Ambulatory Visit (INDEPENDENT_AMBULATORY_CARE_PROVIDER_SITE_OTHER): Payer: Medicare HMO

## 2019-04-09 DIAGNOSIS — Z950 Presence of cardiac pacemaker: Secondary | ICD-10-CM | POA: Diagnosis not present

## 2019-04-09 DIAGNOSIS — I5022 Chronic systolic (congestive) heart failure: Secondary | ICD-10-CM | POA: Diagnosis not present

## 2019-04-11 NOTE — Progress Notes (Signed)
EPIC Encounter for ICM Monitoring  Patient Name: JAELYN BOURGOIN is a 86 y.o. female Date: 04/11/2019 Primary Care Physican: Lawerance Cruel, MD Primary Cardiologist:Skains/Bensimhon Electrophysiologist: Vergie Living Pacing: 97% 3/24/2021Weight: 170 lbs   Spoke with patient and is asymptomatic for fluid accumulation.  CorvueThoracic impedancenormal.  Prescribed:Furosemide 40 mgTake one tablet (40 mg) by mouth once every other day. May take extra as needed.Potassium 10 mEq 1 tablet daily.  LABS: 08/04/2018 Creatinine 1.79, BUN 34, Potassium 4.0, Sodium 137, GFR 25-29 03/10/2018 Creatinine 1.61, BUN 27, Potassium 4.3, Sodium 138, GFR 28-33 01/27/2018 Creatinine 1.55, BUN 27, Potassium 4.8, Sodium 141, GFR 30-34  Recommendations: No changes and encouraged to call if experiencing any fluid symptoms.  Follow-up plan: ICM clinic phone appointment on4/26/2021.91 day device clinic remote transmission5/26/2021.  Copy of ICM check sent to [place].  3 month ICM trend: 04/09/2019    1 Year ICM trend:       [name], RN 04/11/2019 10:14 AM

## 2019-04-23 ENCOUNTER — Other Ambulatory Visit: Payer: Self-pay

## 2019-04-23 ENCOUNTER — Ambulatory Visit: Payer: Medicare HMO | Admitting: Family Medicine

## 2019-04-23 ENCOUNTER — Encounter: Payer: Self-pay | Admitting: Family Medicine

## 2019-04-23 DIAGNOSIS — M79604 Pain in right leg: Secondary | ICD-10-CM | POA: Diagnosis not present

## 2019-04-23 MED ORDER — GABAPENTIN 100 MG PO CAPS: ORAL_CAPSULE | ORAL | 3 refills | Status: DC

## 2019-04-23 NOTE — Progress Notes (Signed)
Office Visit Note   Patient: Alexandra [name]           Date of Birth: [birth year]           MRN: [id number] Visit Date: 04/23/2019 Requested by: Lawerance Cruel, [place],  [place] [address] PCP: Lawerance Cruel, MD  Subjective: Chief Complaint  Patient presents with  . Right Leg - Pain    Pain in the whole leg with straightening the leg. Was given a prednisone taper by PCP approx 2 weeks ago. Got better, then came back as before. She has "a little pain" in the lower back. Occ numbness posterior leg.    HPI: She is here with right leg pain.  Symptoms started about 3 weeks ago, no injury.  Her PCP gave her prednisone for presumed sciatica.  It helped initially, but then it wore off.  Pain radiates from the lateral hip down the side of her leg to the foot.  No numbness or tingling, no bowel or bladder dysfunction.  She cannot find a comfortable position.  She has a history of lumbar spondylosis based on x-ray.  She cannot have MRI scan due to pacemaker.  She denies any current back pain.              ROS: No fevers or chills.  All other systems were reviewed and are negative.  Objective: Vital Signs: There were no vitals taken for this visit.  Physical Exam:  General:  Alert and oriented, in no acute distress. Pulm:  Breathing unlabored. Psy:  Normal mood, congruent affect. Skin: No rash.   Right leg: She has tenderness in the sciatic notch.  She is tender over the greater trochanter.  She is tender on the medial side of her knee but she has no knee effusion.  Lower extremity strength and reflexes are normal.   Imaging: None today  Assessment & Plan: 1.  Right leg pain, suspect sciatica. -Discussed options with her, she wants me to inject her greater trochanter today since this is helped her in the past.  We will also start gabapentin. -Showed her back extension exercises to do at home.  She did not want to go to physical therapy. -If symptoms persist,  could try PT orals refer her for a one-time epidural steroid injection.     Procedures: Right hip greater trochanter injection: After sterile prep with Betadine, injected 5 cc 1% lidocaine without epinephrine and 40 mg methylprednisolone into the area of maximum tenderness at the greater trochanter.    PMFS History: Patient Active Problem List   Diagnosis Date Noted  . Unilateral primary osteoarthritis, right knee 07/04/2017  . Dyspnea   . Chronic systolic heart failure ([place])   . Pain in left shin 02/07/2015  . Leukocytosis 01/31/2015  . Chronic systolic CHF (congestive heart failure), NYHA class 3 ([place]) 01/16/2015  . Protein-calorie malnutrition, severe ([place]) 01/15/2015  . Infection due to Corynebacterium diphtheriae 12/24/2014  . Bacteremia   . Fever 12/19/2014  . Chronic systolic CHF (congestive heart failure), NYHA class 4 ([place]) 10/23/2014  . Situational depression   . Frequent headaches   . Pulmonary embolism ([place]) 10/10/2014  . Chronic anticoagulation 10/10/2014  . Takotsubo syndrome 08/19/2014  . HCAP (healthcare-associated pneumonia) 08/17/2014  . H/O Asthma 08/16/2014  . LBBB (left bundle branch block) 08/16/2014  . Cardiomyopathy- etiology not yet determined 08/16/2014  . PSVT (paroxysmal supraventricular tachycardia) ([place]) 08/16/2014  . CKD (chronic kidney disease) stage 3, GFR  30-59 ml/min 08/13/2014  . Anemia of chronic disease 08/13/2014  . Hypothyroidism 08/13/2014  . Acute cholecystitis 08/11/2014  . Abdominal pain 08/11/2014  . Essential (primary) hypertension 04/02/2014   Past Medical History:  Diagnosis Date  . Adult hypothyroidism 04/02/2014  . Anemia of chronic disease 08/13/2014  . Anxiety   . Arthritis   . Asthma   . breast ca dx'd 2000   left  . CAP (community acquired pneumonia) 12/19/2012  . Chest pain    NM stress test 05/2011 Normal  . CHF (congestive heart failure) ([place]) 08/19/2014  . CKD (chronic kidney disease) stage 3, GFR 30-59 ml/min  08/13/2014  . Depression   . DOE (dyspnea on exertion)    No SOB at rest  . Essential (primary) hypertension 04/02/2014  . Fatigue    excertional  . SMOLMBEM(754.4)   . Hereditary and idiopathic peripheral neuropathy 07/02/2014  . Hypercholesteremia   . Intermittent LBBB (left bundle branch block) 12/21/2012  . Leg weakness   . Neuropathy   . Obesity   . Osteopenia   . Peripheral neuropathy   . Presence of permanent cardiac pacemaker 01/16/2015   BIV  . Sacroiliitis, not elsewhere classified ([place])   . Situational depression   . Trigger finger    [name]    Family History  Problem Relation Age of Onset  . Pneumonia Mother 35       cause of death  . Diabetes Brother   . Pancreatic cancer Father 51  . Alzheimer's disease Sister   . CVA Daughter        01/21/09  . Stroke Daughter   . Hypertension Daughter   . Heart attack Neg Hx     Past Surgical History:  Procedure Laterality Date  . ACHILLES TENDON SURGERY    . BREAST LUMPECTOMY     breast cancer  . CARDIAC CATHETERIZATION N/A 07/21/2015   Procedure: Right Heart Cath;  Surgeon: Jolaine Artist, MD;  Location: [facility];  Service: Cardiovascular;  Laterality: N/A;  . CHOLECYSTECTOMY N/A 08/13/2014   Procedure: LAPAROSCOPIC CHOLECYSTECTOMY;  Surgeon: Stark Klein, MD;  Location: [facility];  Service: General;  Laterality: N/A;  . EP IMPLANTABLE DEVICE N/A 01/16/2015   Procedure: BiV Pacemaker Insertion CRT-P;  Surgeon: Deboraha Sprang, MD;  Location: [facility];  Service: Cardiovascular;  Laterality: N/A;  . LEG SURGERY    . LUMBAR LAMINECTOMY    . POLYPECTOMY    . TONSILLECTOMY    . VESICOVAGINAL FISTULA CLOSURE W/ TAH     DC hysterectomy   Social History   Occupational History  . Occupation: Retired  Tobacco Use  . Smoking status: Never Smoker  . Smokeless tobacco: Never Used  Substance and Sexual Activity  . Alcohol use: No    Alcohol/week: 0.0 standard drinks  . Drug use: No  . Sexual  activity: Never

## 2019-04-25 ENCOUNTER — Telehealth (HOSPITAL_COMMUNITY): Payer: Self-pay | Admitting: *Deleted

## 2019-04-25 NOTE — Telephone Encounter (Signed)
Yep  it is fine

## 2019-04-25 NOTE — Telephone Encounter (Signed)
Pt left VM stating her PCP prescribed gabapentin and she wants to know if this is ok to take with her other medications?   Routed to [place] for advice

## 2019-04-26 ENCOUNTER — Other Ambulatory Visit: Payer: Self-pay

## 2019-04-26 ENCOUNTER — Ambulatory Visit: Payer: Medicare HMO | Admitting: Orthopaedic Surgery

## 2019-04-26 NOTE — Telephone Encounter (Signed)
Called pt back to advise. No answer/no vm. Will try pt again later.

## 2019-04-27 NOTE — Telephone Encounter (Signed)
2nd attempt to reach pt no answer/no vm set up.

## 2019-05-07 ENCOUNTER — Telehealth: Payer: Self-pay

## 2019-05-07 ENCOUNTER — Other Ambulatory Visit: Payer: Self-pay

## 2019-05-07 ENCOUNTER — Encounter: Payer: Self-pay | Admitting: Family Medicine

## 2019-05-07 ENCOUNTER — Ambulatory Visit: Payer: Medicare HMO | Admitting: Family Medicine

## 2019-05-07 DIAGNOSIS — M1711 Unilateral primary osteoarthritis, right knee: Secondary | ICD-10-CM

## 2019-05-07 DIAGNOSIS — M5441 Lumbago with sciatica, right side: Secondary | ICD-10-CM | POA: Diagnosis not present

## 2019-05-07 NOTE — Progress Notes (Signed)
Fortunately, we were able to get approval for right knee Monovisc injection today.  After sterile prep with betadine, injected 3 cc 1% lidocaine without epinephrine and then Monovisc from lateral midpatellar approach, a flash of clear yellow synovial fluid was obtained prior to injection confirming intra-articular placement.  We will also arrange for home health physical therapy.

## 2019-05-07 NOTE — Telephone Encounter (Signed)
Approved for Monovisc-right knee Dr. Junius Roads Co[place] auth # [id number] Dates: 05/07/19-11/06/19

## 2019-05-07 NOTE — Progress Notes (Signed)
Not any better

## 2019-05-07 NOTE — Progress Notes (Signed)
Office Visit Note   Patient: Alexandra [name]           Date of Birth: 02/20/1927           MRN: [id number] Visit Date: 05/07/2019 Requested by: Lawerance Cruel, [place],  [place] [address] PCP: Lawerance Cruel, MD  Subjective: Chief Complaint  Patient presents with  . Lower Back - Follow-up    HPI: She is here with persistent right leg pain.  Greater trochanter injection did not help.  She feels pain from the posterior lateral hip all the way down to the foot.  She also complains of pain in the knee.  She wonders whether a knee injection might help.  She had gel injection on the left in 2019 which gave her good results.  Denies bowel or bladder dysfunction.  Her daughter from [place] is with her today.              ROS:   All other systems were reviewed and are negative.  Objective: Vital Signs: There were no vitals taken for this visit.  Physical Exam:  General:  Alert and oriented, in no acute distress. Pulm:  Breathing unlabored. Psy:  Normal mood, congruent affect.  Right leg: She has tenderness still over the greater trochanter.  No pain with internal hip rotation.  Trace effusion in the right knee with no warmth.  Lower extremity strength and reflexes are still normal.  Imaging: None today  Assessment & Plan: 1.  Persistent right leg pain, suspect sciatica.  She also has right knee osteoarthritis. -Discussed options with her, she does not feel like physical therapy would be feasible. -We will refer her for epidural steroid injection. -We will also attempt to get approval for gel injection for the right knee.     Procedures: No procedures performed  No notes on file     PMFS History: Patient Active Problem List   Diagnosis Date Noted  . Unilateral primary osteoarthritis, right knee 07/04/2017  . Dyspnea   . Chronic systolic heart failure ([place])   . Pain in left shin 02/07/2015  . Leukocytosis 01/31/2015  . Chronic systolic CHF  (congestive heart failure), NYHA class 3 ([place]) 01/16/2015  . Protein-calorie malnutrition, severe ([place]) 01/15/2015  . Infection due to Corynebacterium diphtheriae 12/24/2014  . Bacteremia   . Fever 12/19/2014  . Chronic systolic CHF (congestive heart failure), NYHA class 4 ([place]) 10/23/2014  . Situational depression   . Frequent headaches   . Pulmonary embolism ([place]) 10/10/2014  . Chronic anticoagulation 10/10/2014  . Takotsubo syndrome 08/19/2014  . HCAP (healthcare-associated pneumonia) 08/17/2014  . H/O Asthma 08/16/2014  . LBBB (left bundle branch block) 08/16/2014  . Cardiomyopathy- etiology not yet determined 08/16/2014  . PSVT (paroxysmal supraventricular tachycardia) ([place]) 08/16/2014  . CKD (chronic kidney disease) stage 3, GFR 30-59 ml/min 08/13/2014  . Anemia of chronic disease 08/13/2014  . Hypothyroidism 08/13/2014  . Acute cholecystitis 08/11/2014  . Abdominal pain 08/11/2014  . Essential (primary) hypertension 04/02/2014   Past Medical History:  Diagnosis Date  . Adult hypothyroidism 04/02/2014  . Anemia of chronic disease 08/13/2014  . Anxiety   . Arthritis   . Asthma   . breast ca dx'd 2000   left  . CAP (community acquired pneumonia) 12/19/2012  . Chest pain    NM stress test 05/2011 Normal  . CHF (congestive heart failure) ([place]) 08/19/2014  . CKD (chronic kidney disease) stage 3, GFR 30-59 ml/min 08/13/2014  .  Depression   . DOE (dyspnea on exertion)    No SOB at rest  . Essential (primary) hypertension 04/02/2014  . Fatigue    excertional  . ZWCHENID(782.4)   . Hereditary and idiopathic peripheral neuropathy 07/02/2014  . Hypercholesteremia   . Intermittent LBBB (left bundle branch block) 12/21/2012  . Leg weakness   . Neuropathy   . Obesity   . Osteopenia   . Peripheral neuropathy   . Presence of permanent cardiac pacemaker 01/16/2015   BIV  . Sacroiliitis, not elsewhere classified ([place])   . Situational depression   . Trigger finger    [name]      Family History  Problem Relation Age of Onset  . Pneumonia Mother 61       cause of death  . Diabetes Brother   . Pancreatic cancer Father 69  . Alzheimer's disease Sister   . CVA Daughter        01/21/09  . Stroke Daughter   . Hypertension Daughter   . Heart attack Neg Hx     Past Surgical History:  Procedure Laterality Date  . ACHILLES TENDON SURGERY    . BREAST LUMPECTOMY     breast cancer  . CARDIAC CATHETERIZATION N/A 07/21/2015   Procedure: Right Heart Cath;  Surgeon: Jolaine Artist, MD;  Location: [facility];  Service: Cardiovascular;  Laterality: N/A;  . CHOLECYSTECTOMY N/A 08/13/2014   Procedure: LAPAROSCOPIC CHOLECYSTECTOMY;  Surgeon: Stark Klein, MD;  Location: [facility];  Service: General;  Laterality: N/A;  . EP IMPLANTABLE DEVICE N/A 01/16/2015   Procedure: BiV Pacemaker Insertion CRT-P;  Surgeon: Deboraha Sprang, MD;  Location: [facility];  Service: Cardiovascular;  Laterality: N/A;  . LEG SURGERY    . LUMBAR LAMINECTOMY    . POLYPECTOMY    . TONSILLECTOMY    . VESICOVAGINAL FISTULA CLOSURE W/ TAH     DC hysterectomy   Social History   Occupational History  . Occupation: Retired  Tobacco Use  . Smoking status: Never Smoker  . Smokeless tobacco: Never Used  Substance and Sexual Activity  . Alcohol use: No    Alcohol/week: 0.0 standard drinks  . Drug use: No  . Sexual activity: Never

## 2019-05-07 NOTE — Addendum Note (Signed)
Addended by: Hortencia Pilar on: 05/07/2019 04:57 PM   Modules accepted: Orders

## 2019-05-09 NOTE — Telephone Encounter (Signed)
20% OOP $25 Copay

## 2019-05-11 ENCOUNTER — Telehealth: Payer: Self-pay | Admitting: Family Medicine

## 2019-05-11 NOTE — Telephone Encounter (Signed)
Cindy from [facility] called to let Dr. Junius Roads know that they are delayed and was not able to get her scheduled until next Wednesday.  CB#[phone].  Thank you.

## 2019-05-14 ENCOUNTER — Ambulatory Visit (INDEPENDENT_AMBULATORY_CARE_PROVIDER_SITE_OTHER): Payer: Medicare HMO

## 2019-05-14 DIAGNOSIS — Z950 Presence of cardiac pacemaker: Secondary | ICD-10-CM | POA: Diagnosis not present

## 2019-05-14 DIAGNOSIS — I5022 Chronic systolic (congestive) heart failure: Secondary | ICD-10-CM | POA: Diagnosis not present

## 2019-05-14 NOTE — Telephone Encounter (Signed)
FYI

## 2019-05-15 ENCOUNTER — Telehealth: Payer: Self-pay

## 2019-05-15 DIAGNOSIS — I130 Hypertensive heart and chronic kidney disease with heart failure and stage 1 through stage 4 chronic kidney disease, or unspecified chronic kidney disease: Secondary | ICD-10-CM | POA: Diagnosis not present

## 2019-05-15 DIAGNOSIS — N183 Chronic kidney disease, stage 3 unspecified: Secondary | ICD-10-CM | POA: Diagnosis not present

## 2019-05-15 DIAGNOSIS — I5022 Chronic systolic (congestive) heart failure: Secondary | ICD-10-CM | POA: Diagnosis not present

## 2019-05-15 DIAGNOSIS — M1711 Unilateral primary osteoarthritis, right knee: Secondary | ICD-10-CM | POA: Diagnosis not present

## 2019-05-15 DIAGNOSIS — I429 Cardiomyopathy, unspecified: Secondary | ICD-10-CM | POA: Diagnosis not present

## 2019-05-15 DIAGNOSIS — D631 Anemia in chronic kidney disease: Secondary | ICD-10-CM | POA: Diagnosis not present

## 2019-05-15 DIAGNOSIS — I471 Supraventricular tachycardia: Secondary | ICD-10-CM | POA: Diagnosis not present

## 2019-05-15 DIAGNOSIS — J45909 Unspecified asthma, uncomplicated: Secondary | ICD-10-CM | POA: Diagnosis not present

## 2019-05-15 DIAGNOSIS — M5441 Lumbago with sciatica, right side: Secondary | ICD-10-CM | POA: Diagnosis not present

## 2019-05-15 NOTE — Telephone Encounter (Signed)
Spoke with patient to remind of missed remote transmission 

## 2019-05-16 ENCOUNTER — Telehealth: Payer: Self-pay | Admitting: Orthopaedic Surgery

## 2019-05-16 NOTE — Progress Notes (Signed)
EPIC Encounter for ICM Monitoring  Patient Name: Alexandra [name] is a 86 y.o. female Date: 05/16/2019 Primary Care Physican: Lawerance Cruel, MD Primary Cardiologist:Skains/Bensimhon Electrophysiologist: Vergie Living Pacing: 97% 3/24/2021Weight: 170 lbs   Spoke with patient and is asymptomatic for fluid accumulation.  CorvueThoracic impedancesuggesting possible fluid accumulation starting 05/14/2019  Prescribed:Furosemide 40 mgTake one tablet (40 mg) by mouth once every other day. May take extra as needed.Potassium 10 mEq 1 tablet daily.  LABS: 08/04/2018 Creatinine 1.79, BUN 34, Potassium 4.0, Sodium 137, GFR 25-29 03/10/2018 Creatinine 1.61, BUN 27, Potassium 4.3, Sodium 138, GFR 28-33 01/27/2018 Creatinine 1.55, BUN 27, Potassium 4.8, Sodium 141, GFR 30-34  Recommendations: Patient will take extra Furosemide and Potassium.  Advised to limit salt intake  Follow-up plan: ICM clinic phone appointment on5/10/2019 to recheck fluid levels.91 day device clinic remote transmission5/26/2021.  Copy of ICM check sent to [place].  3 month ICM trend: 05/15/2019    1 Year ICM trend:       [name], RN 05/16/2019 1:37 PM

## 2019-05-16 NOTE — Telephone Encounter (Signed)
LMOM verbal order

## 2019-05-16 NOTE — Telephone Encounter (Signed)
Alexandra [name] from [facility] called. She would like verbal orders for PT. 2x 4wks, 1x 5wks. Her number is [phone]

## 2019-05-21 ENCOUNTER — Telehealth: Payer: Self-pay | Admitting: Family Medicine

## 2019-05-21 NOTE — Telephone Encounter (Signed)
Patient called requesting a call back from Dr. Junius Roads. Patient has medical questions about CT Scan. Patient phone number is [phone].

## 2019-05-22 ENCOUNTER — Other Ambulatory Visit: Payer: Self-pay | Admitting: Family Medicine

## 2019-05-22 DIAGNOSIS — I471 Supraventricular tachycardia: Secondary | ICD-10-CM | POA: Diagnosis not present

## 2019-05-22 DIAGNOSIS — J45909 Unspecified asthma, uncomplicated: Secondary | ICD-10-CM | POA: Diagnosis not present

## 2019-05-22 DIAGNOSIS — I429 Cardiomyopathy, unspecified: Secondary | ICD-10-CM | POA: Diagnosis not present

## 2019-05-22 DIAGNOSIS — M5441 Lumbago with sciatica, right side: Secondary | ICD-10-CM | POA: Diagnosis not present

## 2019-05-22 DIAGNOSIS — M1711 Unilateral primary osteoarthritis, right knee: Secondary | ICD-10-CM | POA: Diagnosis not present

## 2019-05-22 DIAGNOSIS — N183 Chronic kidney disease, stage 3 unspecified: Secondary | ICD-10-CM | POA: Diagnosis not present

## 2019-05-22 DIAGNOSIS — I130 Hypertensive heart and chronic kidney disease with heart failure and stage 1 through stage 4 chronic kidney disease, or unspecified chronic kidney disease: Secondary | ICD-10-CM | POA: Diagnosis not present

## 2019-05-22 DIAGNOSIS — D631 Anemia in chronic kidney disease: Secondary | ICD-10-CM | POA: Diagnosis not present

## 2019-05-22 DIAGNOSIS — I5022 Chronic systolic (congestive) heart failure: Secondary | ICD-10-CM | POA: Diagnosis not present

## 2019-05-22 NOTE — Telephone Encounter (Signed)
The patient continues to have much low back pain and would like to get a CT (? CT myelogram) so that she can then have an ESI. She said someone called her and told her she would need to have that test first (I cannot find a record of this in the chart). The patient is asking to have this done ASAP, so she can get some relief. [facility] will be coming to her home this afternoon to start PT. Please advise.

## 2019-05-22 NOTE — Telephone Encounter (Signed)
I called the patient and advised her of the plan. She will await a call from one of Dr. Romona Curls schedulers.

## 2019-05-22 NOTE — Telephone Encounter (Signed)
-----   Message from Eunice Blase, MD sent at 05/22/2019  1:49 PM EDT ----- Regarding: ESI Per Dr. Ernestina Patches, no CT needed.  Will schedule ESI with him.  If it doesn't help, then we'll order CT myelogram.

## 2019-05-22 NOTE — Telephone Encounter (Signed)
CT scan is generally not needed for a one-time ESI.  I will confirm with Dr. Ernestina Patches.

## 2019-05-25 DIAGNOSIS — J45909 Unspecified asthma, uncomplicated: Secondary | ICD-10-CM | POA: Diagnosis not present

## 2019-05-25 DIAGNOSIS — I471 Supraventricular tachycardia: Secondary | ICD-10-CM | POA: Diagnosis not present

## 2019-05-25 DIAGNOSIS — D631 Anemia in chronic kidney disease: Secondary | ICD-10-CM | POA: Diagnosis not present

## 2019-05-25 DIAGNOSIS — I5022 Chronic systolic (congestive) heart failure: Secondary | ICD-10-CM | POA: Diagnosis not present

## 2019-05-25 DIAGNOSIS — N183 Chronic kidney disease, stage 3 unspecified: Secondary | ICD-10-CM | POA: Diagnosis not present

## 2019-05-25 DIAGNOSIS — M5441 Lumbago with sciatica, right side: Secondary | ICD-10-CM | POA: Diagnosis not present

## 2019-05-25 DIAGNOSIS — I429 Cardiomyopathy, unspecified: Secondary | ICD-10-CM | POA: Diagnosis not present

## 2019-05-25 DIAGNOSIS — I130 Hypertensive heart and chronic kidney disease with heart failure and stage 1 through stage 4 chronic kidney disease, or unspecified chronic kidney disease: Secondary | ICD-10-CM | POA: Diagnosis not present

## 2019-05-25 DIAGNOSIS — M1711 Unilateral primary osteoarthritis, right knee: Secondary | ICD-10-CM | POA: Diagnosis not present

## 2019-05-28 ENCOUNTER — Ambulatory Visit (INDEPENDENT_AMBULATORY_CARE_PROVIDER_SITE_OTHER): Payer: Medicare HMO

## 2019-05-28 DIAGNOSIS — Z950 Presence of cardiac pacemaker: Secondary | ICD-10-CM

## 2019-05-28 DIAGNOSIS — Z853 Personal history of malignant neoplasm of breast: Secondary | ICD-10-CM | POA: Diagnosis not present

## 2019-05-28 DIAGNOSIS — D649 Anemia, unspecified: Secondary | ICD-10-CM | POA: Diagnosis not present

## 2019-05-28 DIAGNOSIS — J4520 Mild intermittent asthma, uncomplicated: Secondary | ICD-10-CM | POA: Diagnosis not present

## 2019-05-28 DIAGNOSIS — M179 Osteoarthritis of knee, unspecified: Secondary | ICD-10-CM | POA: Diagnosis not present

## 2019-05-28 DIAGNOSIS — N184 Chronic kidney disease, stage 4 (severe): Secondary | ICD-10-CM | POA: Diagnosis not present

## 2019-05-28 DIAGNOSIS — I5022 Chronic systolic (congestive) heart failure: Secondary | ICD-10-CM

## 2019-05-28 DIAGNOSIS — E039 Hypothyroidism, unspecified: Secondary | ICD-10-CM | POA: Diagnosis not present

## 2019-05-28 DIAGNOSIS — I509 Heart failure, unspecified: Secondary | ICD-10-CM | POA: Diagnosis not present

## 2019-05-28 DIAGNOSIS — I10 Essential (primary) hypertension: Secondary | ICD-10-CM | POA: Diagnosis not present

## 2019-05-29 DIAGNOSIS — D631 Anemia in chronic kidney disease: Secondary | ICD-10-CM | POA: Diagnosis not present

## 2019-05-29 DIAGNOSIS — J45909 Unspecified asthma, uncomplicated: Secondary | ICD-10-CM | POA: Diagnosis not present

## 2019-05-29 DIAGNOSIS — N183 Chronic kidney disease, stage 3 unspecified: Secondary | ICD-10-CM | POA: Diagnosis not present

## 2019-05-29 DIAGNOSIS — I429 Cardiomyopathy, unspecified: Secondary | ICD-10-CM | POA: Diagnosis not present

## 2019-05-29 DIAGNOSIS — M1711 Unilateral primary osteoarthritis, right knee: Secondary | ICD-10-CM | POA: Diagnosis not present

## 2019-05-29 DIAGNOSIS — I471 Supraventricular tachycardia: Secondary | ICD-10-CM | POA: Diagnosis not present

## 2019-05-29 DIAGNOSIS — I130 Hypertensive heart and chronic kidney disease with heart failure and stage 1 through stage 4 chronic kidney disease, or unspecified chronic kidney disease: Secondary | ICD-10-CM | POA: Diagnosis not present

## 2019-05-29 DIAGNOSIS — M5441 Lumbago with sciatica, right side: Secondary | ICD-10-CM | POA: Diagnosis not present

## 2019-05-29 DIAGNOSIS — I5022 Chronic systolic (congestive) heart failure: Secondary | ICD-10-CM | POA: Diagnosis not present

## 2019-05-30 NOTE — Progress Notes (Signed)
EPIC Encounter for ICM Monitoring  Patient Name: ALEXZANDRIA MASSMAN is a 86 y.o. female Date: 05/30/2019 Primary Care Physican: Lawerance Cruel, MD Primary Cardiologist:Skains/Bensimhon Electrophysiologist: Vergie Living Pacing: 97% 3/24/2021Weight: 170 lbs   Spoke with patient and is asymptomatic for fluid accumulation.  CorvueThoracic impedancereturned to normal since taking extra Furosemide.  Prescribed:Furosemide 40 mgTake one tablet (40 mg) by mouth once every other day. May take extra as needed.Potassium 10 mEq 1 tablet daily.  LABS: 08/04/2018 Creatinine 1.79, BUN 34, Potassium 4.0, Sodium 137, GFR 25-29 03/10/2018 Creatinine 1.61, BUN 27, Potassium 4.3, Sodium 138, GFR 28-33 01/27/2018 Creatinine 1.55, BUN 27, Potassium 4.8, Sodium 141, GFR 30-34  Recommendations:  No changes and encouraged to call if experiencing any fluid symptoms.  Follow-up plan: ICM clinic phone appointment on6/07/2019 to recheck fluid levels.91 day device clinic remote transmission5/26/2021.  Copy of ICM check sent to [place].   3 month ICM trend: 05/28/2019    1 Year ICM trend:       [name], RN 05/30/2019 10:07 AM

## 2019-05-31 DIAGNOSIS — D631 Anemia in chronic kidney disease: Secondary | ICD-10-CM | POA: Diagnosis not present

## 2019-05-31 DIAGNOSIS — J45909 Unspecified asthma, uncomplicated: Secondary | ICD-10-CM | POA: Diagnosis not present

## 2019-05-31 DIAGNOSIS — I471 Supraventricular tachycardia: Secondary | ICD-10-CM | POA: Diagnosis not present

## 2019-05-31 DIAGNOSIS — I5022 Chronic systolic (congestive) heart failure: Secondary | ICD-10-CM | POA: Diagnosis not present

## 2019-05-31 DIAGNOSIS — M5441 Lumbago with sciatica, right side: Secondary | ICD-10-CM | POA: Diagnosis not present

## 2019-05-31 DIAGNOSIS — I429 Cardiomyopathy, unspecified: Secondary | ICD-10-CM | POA: Diagnosis not present

## 2019-05-31 DIAGNOSIS — M1711 Unilateral primary osteoarthritis, right knee: Secondary | ICD-10-CM | POA: Diagnosis not present

## 2019-05-31 DIAGNOSIS — N183 Chronic kidney disease, stage 3 unspecified: Secondary | ICD-10-CM | POA: Diagnosis not present

## 2019-05-31 DIAGNOSIS — I130 Hypertensive heart and chronic kidney disease with heart failure and stage 1 through stage 4 chronic kidney disease, or unspecified chronic kidney disease: Secondary | ICD-10-CM | POA: Diagnosis not present

## 2019-06-04 DIAGNOSIS — N183 Chronic kidney disease, stage 3 unspecified: Secondary | ICD-10-CM | POA: Diagnosis not present

## 2019-06-04 DIAGNOSIS — D631 Anemia in chronic kidney disease: Secondary | ICD-10-CM | POA: Diagnosis not present

## 2019-06-04 DIAGNOSIS — M5441 Lumbago with sciatica, right side: Secondary | ICD-10-CM | POA: Diagnosis not present

## 2019-06-04 DIAGNOSIS — I471 Supraventricular tachycardia: Secondary | ICD-10-CM | POA: Diagnosis not present

## 2019-06-04 DIAGNOSIS — M1711 Unilateral primary osteoarthritis, right knee: Secondary | ICD-10-CM | POA: Diagnosis not present

## 2019-06-04 DIAGNOSIS — I5022 Chronic systolic (congestive) heart failure: Secondary | ICD-10-CM | POA: Diagnosis not present

## 2019-06-04 DIAGNOSIS — I429 Cardiomyopathy, unspecified: Secondary | ICD-10-CM | POA: Diagnosis not present

## 2019-06-04 DIAGNOSIS — J45909 Unspecified asthma, uncomplicated: Secondary | ICD-10-CM | POA: Diagnosis not present

## 2019-06-04 DIAGNOSIS — I130 Hypertensive heart and chronic kidney disease with heart failure and stage 1 through stage 4 chronic kidney disease, or unspecified chronic kidney disease: Secondary | ICD-10-CM | POA: Diagnosis not present

## 2019-06-06 ENCOUNTER — Telehealth: Payer: Self-pay | Admitting: Family Medicine

## 2019-06-06 DIAGNOSIS — I471 Supraventricular tachycardia: Secondary | ICD-10-CM | POA: Diagnosis not present

## 2019-06-06 DIAGNOSIS — M5441 Lumbago with sciatica, right side: Secondary | ICD-10-CM | POA: Diagnosis not present

## 2019-06-06 DIAGNOSIS — I5022 Chronic systolic (congestive) heart failure: Secondary | ICD-10-CM | POA: Diagnosis not present

## 2019-06-06 DIAGNOSIS — I130 Hypertensive heart and chronic kidney disease with heart failure and stage 1 through stage 4 chronic kidney disease, or unspecified chronic kidney disease: Secondary | ICD-10-CM | POA: Diagnosis not present

## 2019-06-06 DIAGNOSIS — N183 Chronic kidney disease, stage 3 unspecified: Secondary | ICD-10-CM | POA: Diagnosis not present

## 2019-06-06 DIAGNOSIS — M1711 Unilateral primary osteoarthritis, right knee: Secondary | ICD-10-CM | POA: Diagnosis not present

## 2019-06-06 DIAGNOSIS — I429 Cardiomyopathy, unspecified: Secondary | ICD-10-CM | POA: Diagnosis not present

## 2019-06-06 DIAGNOSIS — J45909 Unspecified asthma, uncomplicated: Secondary | ICD-10-CM | POA: Diagnosis not present

## 2019-06-06 DIAGNOSIS — D631 Anemia in chronic kidney disease: Secondary | ICD-10-CM | POA: Diagnosis not present

## 2019-06-06 NOTE — Telephone Encounter (Signed)
Patient would like for Dr. Junius Roads to give her a call.  CB#[phone].  Thank you.

## 2019-06-06 NOTE — Telephone Encounter (Signed)
I called the patient. Her ESI with Dr. Ernestina Patches is not until 07/03/19. She was checking to see if there is any way she can be seen sooner. The patient is currently getting PT, and this does help, but the pain is still there. I told the patient I will see if there is anything available sooner.

## 2019-06-07 ENCOUNTER — Other Ambulatory Visit: Payer: Self-pay | Admitting: Family Medicine

## 2019-06-07 DIAGNOSIS — M5441 Lumbago with sciatica, right side: Secondary | ICD-10-CM

## 2019-06-07 NOTE — Telephone Encounter (Signed)
Sure.  Virgel Gess to [place] at [facility] to see if they can do sooner.

## 2019-06-07 NOTE — Telephone Encounter (Signed)
Could you check with [facility] to see if they could see this patient earlier than 6/15 for an ESI (that is when she is scheduled with Dr. Ernestina Patches). The patient told me she is having a party for her 89th on 5/28 & was hoping to get it before then. I explained about everywhere scheduling into June, but I said I would look into it.

## 2019-06-08 NOTE — Telephone Encounter (Signed)
I called the patient to let her know [facility] would be contacting her to try to get her in sooner. The patient said they called her yesterday - they require a CT scan first. The patient does not wish to have a CT scan, so she will keep the appointment with Dr. Ernestina Patches on 07/03/19. She is still on the cancellation list for Dr. Ernestina Patches, though.

## 2019-06-11 DIAGNOSIS — N184 Chronic kidney disease, stage 4 (severe): Secondary | ICD-10-CM | POA: Diagnosis not present

## 2019-06-11 DIAGNOSIS — R0602 Shortness of breath: Secondary | ICD-10-CM | POA: Diagnosis not present

## 2019-06-11 DIAGNOSIS — D6859 Other primary thrombophilia: Secondary | ICD-10-CM | POA: Diagnosis not present

## 2019-06-11 DIAGNOSIS — E039 Hypothyroidism, unspecified: Secondary | ICD-10-CM | POA: Diagnosis not present

## 2019-06-11 DIAGNOSIS — R252 Cramp and spasm: Secondary | ICD-10-CM | POA: Diagnosis not present

## 2019-06-11 DIAGNOSIS — Z23 Encounter for immunization: Secondary | ICD-10-CM | POA: Diagnosis not present

## 2019-06-11 DIAGNOSIS — E559 Vitamin D deficiency, unspecified: Secondary | ICD-10-CM | POA: Diagnosis not present

## 2019-06-11 DIAGNOSIS — M5430 Sciatica, unspecified side: Secondary | ICD-10-CM | POA: Diagnosis not present

## 2019-06-11 DIAGNOSIS — I10 Essential (primary) hypertension: Secondary | ICD-10-CM | POA: Diagnosis not present

## 2019-06-11 DIAGNOSIS — Z0000 Encounter for general adult medical examination without abnormal findings: Secondary | ICD-10-CM | POA: Diagnosis not present

## 2019-06-11 DIAGNOSIS — Z13220 Encounter for screening for lipoid disorders: Secondary | ICD-10-CM | POA: Diagnosis not present

## 2019-06-11 DIAGNOSIS — R5383 Other fatigue: Secondary | ICD-10-CM | POA: Diagnosis not present

## 2019-06-13 ENCOUNTER — Ambulatory Visit (INDEPENDENT_AMBULATORY_CARE_PROVIDER_SITE_OTHER): Payer: Medicare HMO | Admitting: *Deleted

## 2019-06-13 DIAGNOSIS — M1711 Unilateral primary osteoarthritis, right knee: Secondary | ICD-10-CM | POA: Diagnosis not present

## 2019-06-13 DIAGNOSIS — I471 Supraventricular tachycardia: Secondary | ICD-10-CM | POA: Diagnosis not present

## 2019-06-13 DIAGNOSIS — N183 Chronic kidney disease, stage 3 unspecified: Secondary | ICD-10-CM | POA: Diagnosis not present

## 2019-06-13 DIAGNOSIS — D631 Anemia in chronic kidney disease: Secondary | ICD-10-CM | POA: Diagnosis not present

## 2019-06-13 DIAGNOSIS — J45909 Unspecified asthma, uncomplicated: Secondary | ICD-10-CM | POA: Diagnosis not present

## 2019-06-13 DIAGNOSIS — I130 Hypertensive heart and chronic kidney disease with heart failure and stage 1 through stage 4 chronic kidney disease, or unspecified chronic kidney disease: Secondary | ICD-10-CM | POA: Diagnosis not present

## 2019-06-13 DIAGNOSIS — I429 Cardiomyopathy, unspecified: Secondary | ICD-10-CM | POA: Diagnosis not present

## 2019-06-13 DIAGNOSIS — M5441 Lumbago with sciatica, right side: Secondary | ICD-10-CM | POA: Diagnosis not present

## 2019-06-13 DIAGNOSIS — I5022 Chronic systolic (congestive) heart failure: Secondary | ICD-10-CM | POA: Diagnosis not present

## 2019-06-13 LAB — CUP PACEART REMOTE DEVICE CHECK
Battery Remaining Longevity: 62 mo
Battery Remaining Percentage: 71 %
Battery Voltage: 2.92 V
Brady Statistic AP VP Percent: 1 %
Brady Statistic AP VS Percent: 1 %
Brady Statistic AS VP Percent: 96 %
Brady Statistic AS VS Percent: 3.8 %
Brady Statistic RA Percent Paced: 1 %
Date Time Interrogation Session: 20210526020015
Implantable Lead Implant Date: 20161229
Implantable Lead Implant Date: 20161229
Implantable Lead Implant Date: 20161229
Implantable Lead Location: 753858
Implantable Lead Location: 753859
Implantable Lead Location: 753860
Implantable Pulse Generator Implant Date: 20161229
Lead Channel Impedance Value: 1100 Ohm
Lead Channel Impedance Value: 430 Ohm
Lead Channel Impedance Value: 450 Ohm
Lead Channel Pacing Threshold Amplitude: 0.75 V
Lead Channel Pacing Threshold Amplitude: 0.75 V
Lead Channel Pacing Threshold Amplitude: 0.75 V
Lead Channel Pacing Threshold Pulse Width: 0.5 ms
Lead Channel Pacing Threshold Pulse Width: 0.5 ms
Lead Channel Pacing Threshold Pulse Width: 1.5 ms
Lead Channel Sensing Intrinsic Amplitude: 12 mV
Lead Channel Sensing Intrinsic Amplitude: 2 mV
Lead Channel Setting Pacing Amplitude: 1.75 V
Lead Channel Setting Pacing Amplitude: 2 V
Lead Channel Setting Pacing Amplitude: 2 V
Lead Channel Setting Pacing Pulse Width: 0.5 ms
Lead Channel Setting Pacing Pulse Width: 1.5 ms
Lead Channel Setting Sensing Sensitivity: 2 mV
Pulse Gen Model: 3262
Pulse Gen Serial Number: 7834528

## 2019-06-13 NOTE — Progress Notes (Signed)
Remote pacemaker transmission.   

## 2019-06-14 DIAGNOSIS — I130 Hypertensive heart and chronic kidney disease with heart failure and stage 1 through stage 4 chronic kidney disease, or unspecified chronic kidney disease: Secondary | ICD-10-CM | POA: Diagnosis not present

## 2019-06-14 DIAGNOSIS — J45909 Unspecified asthma, uncomplicated: Secondary | ICD-10-CM | POA: Diagnosis not present

## 2019-06-14 DIAGNOSIS — I429 Cardiomyopathy, unspecified: Secondary | ICD-10-CM | POA: Diagnosis not present

## 2019-06-14 DIAGNOSIS — N183 Chronic kidney disease, stage 3 unspecified: Secondary | ICD-10-CM | POA: Diagnosis not present

## 2019-06-14 DIAGNOSIS — I5022 Chronic systolic (congestive) heart failure: Secondary | ICD-10-CM | POA: Diagnosis not present

## 2019-06-14 DIAGNOSIS — I471 Supraventricular tachycardia: Secondary | ICD-10-CM | POA: Diagnosis not present

## 2019-06-14 DIAGNOSIS — D631 Anemia in chronic kidney disease: Secondary | ICD-10-CM | POA: Diagnosis not present

## 2019-06-14 DIAGNOSIS — M5441 Lumbago with sciatica, right side: Secondary | ICD-10-CM | POA: Diagnosis not present

## 2019-06-14 DIAGNOSIS — M1711 Unilateral primary osteoarthritis, right knee: Secondary | ICD-10-CM | POA: Diagnosis not present

## 2019-06-20 DIAGNOSIS — M1711 Unilateral primary osteoarthritis, right knee: Secondary | ICD-10-CM | POA: Diagnosis not present

## 2019-06-20 DIAGNOSIS — I471 Supraventricular tachycardia: Secondary | ICD-10-CM | POA: Diagnosis not present

## 2019-06-20 DIAGNOSIS — D631 Anemia in chronic kidney disease: Secondary | ICD-10-CM | POA: Diagnosis not present

## 2019-06-20 DIAGNOSIS — I130 Hypertensive heart and chronic kidney disease with heart failure and stage 1 through stage 4 chronic kidney disease, or unspecified chronic kidney disease: Secondary | ICD-10-CM | POA: Diagnosis not present

## 2019-06-20 DIAGNOSIS — I429 Cardiomyopathy, unspecified: Secondary | ICD-10-CM | POA: Diagnosis not present

## 2019-06-20 DIAGNOSIS — J45909 Unspecified asthma, uncomplicated: Secondary | ICD-10-CM | POA: Diagnosis not present

## 2019-06-20 DIAGNOSIS — I5022 Chronic systolic (congestive) heart failure: Secondary | ICD-10-CM | POA: Diagnosis not present

## 2019-06-20 DIAGNOSIS — M5441 Lumbago with sciatica, right side: Secondary | ICD-10-CM | POA: Diagnosis not present

## 2019-06-20 DIAGNOSIS — N183 Chronic kidney disease, stage 3 unspecified: Secondary | ICD-10-CM | POA: Diagnosis not present

## 2019-06-25 ENCOUNTER — Ambulatory Visit (INDEPENDENT_AMBULATORY_CARE_PROVIDER_SITE_OTHER): Payer: Medicare HMO

## 2019-06-25 DIAGNOSIS — Z950 Presence of cardiac pacemaker: Secondary | ICD-10-CM

## 2019-06-25 DIAGNOSIS — I5022 Chronic systolic (congestive) heart failure: Secondary | ICD-10-CM | POA: Diagnosis not present

## 2019-06-26 DIAGNOSIS — I5022 Chronic systolic (congestive) heart failure: Secondary | ICD-10-CM | POA: Diagnosis not present

## 2019-06-26 DIAGNOSIS — M5441 Lumbago with sciatica, right side: Secondary | ICD-10-CM | POA: Diagnosis not present

## 2019-06-26 DIAGNOSIS — D631 Anemia in chronic kidney disease: Secondary | ICD-10-CM | POA: Diagnosis not present

## 2019-06-26 DIAGNOSIS — I429 Cardiomyopathy, unspecified: Secondary | ICD-10-CM | POA: Diagnosis not present

## 2019-06-26 DIAGNOSIS — I471 Supraventricular tachycardia: Secondary | ICD-10-CM | POA: Diagnosis not present

## 2019-06-26 DIAGNOSIS — M1711 Unilateral primary osteoarthritis, right knee: Secondary | ICD-10-CM | POA: Diagnosis not present

## 2019-06-26 DIAGNOSIS — J45909 Unspecified asthma, uncomplicated: Secondary | ICD-10-CM | POA: Diagnosis not present

## 2019-06-26 DIAGNOSIS — I130 Hypertensive heart and chronic kidney disease with heart failure and stage 1 through stage 4 chronic kidney disease, or unspecified chronic kidney disease: Secondary | ICD-10-CM | POA: Diagnosis not present

## 2019-06-26 DIAGNOSIS — N183 Chronic kidney disease, stage 3 unspecified: Secondary | ICD-10-CM | POA: Diagnosis not present

## 2019-06-27 NOTE — Progress Notes (Signed)
EPIC Encounter for ICM Monitoring  Patient Name: Alexandra [name] is a 86 y.o. female Date: 06/27/2019 Primary Care Physican: Lawerance Cruel, MD Primary Cardiologist:Skains/Bensimhon Electrophysiologist: Vergie Living Pacing: 96% 6/9/2021Weight: 170 lbs   Spoke with patient and she is feeling well.  She reported she celebrated her 89th birthday which she ate foods that were high in salt.   CorvueThoracic impedancenormal but was suggesting possible fluid accumulation from 5/28 - 6/5.  Prescribed:Furosemide 40 mgTake one tablet (40 mg) by mouth once every other day. May take extra as needed.Potassium 10 mEq 1 tablet daily.  LABS: 08/04/2018 Creatinine 1.79, BUN 34, Potassium 4.0, Sodium 137, GFR 25-29 03/10/2018 Creatinine 1.61, BUN 27, Potassium 4.3, Sodium 138, GFR 28-33 01/27/2018 Creatinine 1.55, BUN 27, Potassium 4.8, Sodium 141, GFR 30-34  Recommendations: No changes and encouraged to call if experiencing any fluid symptoms.  Follow-up plan: ICM clinic phone appointment on7/12/2019.91 day device clinic remote transmission8/30/2021.  Copy of ICM check sent to [place]  3 month ICM trend: 06/26/2019    1 Year ICM trend:       [name], RN 06/27/2019 10:43 AM

## 2019-07-02 ENCOUNTER — Telehealth: Payer: Self-pay | Admitting: Internal Medicine

## 2019-07-02 DIAGNOSIS — I130 Hypertensive heart and chronic kidney disease with heart failure and stage 1 through stage 4 chronic kidney disease, or unspecified chronic kidney disease: Secondary | ICD-10-CM | POA: Diagnosis not present

## 2019-07-02 DIAGNOSIS — J45909 Unspecified asthma, uncomplicated: Secondary | ICD-10-CM | POA: Diagnosis not present

## 2019-07-02 DIAGNOSIS — I5022 Chronic systolic (congestive) heart failure: Secondary | ICD-10-CM | POA: Diagnosis not present

## 2019-07-02 DIAGNOSIS — I471 Supraventricular tachycardia: Secondary | ICD-10-CM | POA: Diagnosis not present

## 2019-07-02 DIAGNOSIS — M1711 Unilateral primary osteoarthritis, right knee: Secondary | ICD-10-CM | POA: Diagnosis not present

## 2019-07-02 DIAGNOSIS — N183 Chronic kidney disease, stage 3 unspecified: Secondary | ICD-10-CM | POA: Diagnosis not present

## 2019-07-02 DIAGNOSIS — M5441 Lumbago with sciatica, right side: Secondary | ICD-10-CM | POA: Diagnosis not present

## 2019-07-02 DIAGNOSIS — I429 Cardiomyopathy, unspecified: Secondary | ICD-10-CM | POA: Diagnosis not present

## 2019-07-02 DIAGNOSIS — D631 Anemia in chronic kidney disease: Secondary | ICD-10-CM | POA: Diagnosis not present

## 2019-07-02 NOTE — Telephone Encounter (Signed)
° ° °  Pt c/o Shortness Of Breath: STAT if SOB developed within the last 24 hours or pt is noticeably SOB on the phone  1. Are you currently SOB (can you hear that pt is SOB on the phone)? No  2. How long have you been experiencing SOB? 2 weeks  3. Are you SOB when sitting or when up moving around? Moving around  4. Are you currently experiencing any other symptoms? No    Pt said she gets SOB when she moves around. She doesn't have any other symptoms but she felt like its from her pacemaker

## 2019-07-02 NOTE — Telephone Encounter (Signed)
Spoke with patient who states she felt like her breathing got bad 2-3 weeks ago. She takes furosemide 40 mg every other day and is aware of the fluid increase that Sharman Cheek, RN saw on her optivol recently. She states it was related to her birthday celebration and it resolved quickly.  Weight is steady. Mild cough, no sputum. Is not aware of seasonal allergies and does not take antihistamine but did notice that she felt worse yesterday after spending time outside Saturday. Has an albuterol inhaler she has not tried; encouraged her that she may use that as directed to see if it helps. I advised she can reach out to PCP for advice regarding allergy medication.  I asked her if she would like to send a remote transmission for review and she states she will send it tomorrow morning. States she is scheduled for epidural steroid injection tomorrow morning and wants to know if it is safe. I advised her to make certain the doctor doing the procedure is aware of her medications and her history. She thanked me for the call.

## 2019-07-03 ENCOUNTER — Ambulatory Visit: Payer: Self-pay

## 2019-07-03 ENCOUNTER — Encounter: Payer: Self-pay | Admitting: Physical Medicine and Rehabilitation

## 2019-07-03 ENCOUNTER — Other Ambulatory Visit: Payer: Self-pay

## 2019-07-03 ENCOUNTER — Ambulatory Visit: Payer: Medicare HMO | Admitting: Physical Medicine and Rehabilitation

## 2019-07-03 VITALS — BP 128/67 | HR 101

## 2019-07-03 DIAGNOSIS — M5416 Radiculopathy, lumbar region: Secondary | ICD-10-CM | POA: Diagnosis not present

## 2019-07-03 DIAGNOSIS — M961 Postlaminectomy syndrome, not elsewhere classified: Secondary | ICD-10-CM

## 2019-07-03 MED ORDER — METHYLPREDNISOLONE ACETATE 80 MG/ML IJ SUSP
40.0000 mg | Freq: Once | INTRAMUSCULAR | Status: AC
  Administered 2019-07-03: 40 mg

## 2019-07-03 NOTE — Telephone Encounter (Signed)
Transmission from 07/03/19 at 09:34 reviewed. Normal CRT-P function. Presenting rhythm AS/BiVP 90s. Lead trends stable. BiVP 96%. No AT/AF or VT/VF episodes since last ICM transmission on 06/26/19. CorVue currently at baseline, but suggestive of recent fluid accumulation x8 days. Routed to Dr. Caryl Comes for review.

## 2019-07-03 NOTE — Telephone Encounter (Signed)
Noted The impedance trend is returning to baseline  I am sorry but I did NOT get an invitation to the party  did You ?? .ts

## 2019-07-03 NOTE — Progress Notes (Signed)
Pt states an occasional pain in the right side of the lower back that radiates into the right leg all the way down. Pt states pain started March 2021. Walking and standing makes pain worse. Tylenol helps with pain.   .Numeric Pain Rating Scale and Functional Assessment Average Pain 5   In the last MONTH (on 0-10 scale) has pain interfered with the following?  1. General activity like being  able to carry out your everyday physical activities such as walking, climbing stairs, carrying groceries, or moving a chair?  Rating(6)   +Driver, +BT(elquis, ok for injection), -Dye Allergies.

## 2019-07-03 NOTE — Progress Notes (Signed)
[name] - 86 y.o. female MRN [id number]  Date of birth: 1928-01-10  Office Visit Note: Visit Date: 07/03/2019 PCP: Lawerance Cruel, MD Referred by: Lawerance Cruel, MD  Subjective: No chief complaint on file.  HPI:  Alexandra [name] is a 86 y.o. female who comes in today for planned Right S1-2 Lumbar epidural steroid injection with fluoroscopic guidance.  The patient has failed conservative care including home exercise, medications, time and activity modification.  This injection will be diagnostic and hopefully therapeutic.  Please see requesting physician notes for further details and justification.  Patient is having right S1 radicular complaints since March of this year.  Pain has subsided a little bit with the use of prednisone and some other medications.  She still has quite a bit of issue in the right leg.  No red flag complaints other than age and history of prior lumbar surgery.  We will complete right S1 transforaminal injection.  Pain on relief would recommend MRI of the lumbar spine versus CT scan.  Patient does have pacemaker.     ROS Otherwise per HPI.  Assessment & Plan: Visit Diagnoses:  1. Lumbar radiculopathy   2. Post laminectomy syndrome     Plan: No additional findings.   Meds & Orders:  Meds ordered this encounter  Medications  . methylPREDNISolone acetate (DEPO-MEDROL) injection 40 mg    Orders Placed This Encounter  Procedures  . XR C-ARM NO REPORT  . Epidural Steroid injection    Follow-up: Return for visit to requesting physician as needed.   Procedures: No procedures performed  S1 Lumbosacral Transforaminal Epidural Steroid Injection - Sub-Pedicular Approach with Fluoroscopic Guidance   Patient: Alexandra [name]      Date of Birth: 02-Apr-1927 MRN: [id number] PCP: Lawerance Cruel, MD      Visit Date: 07/03/2019   Universal Protocol:    Date/Time: 06/15/213:06 PM  Consent Given By: the patient  Position:   PRONE  Additional Comments: Vital signs were monitored before and after the procedure. Patient was prepped and draped in the usual sterile fashion. The correct patient, procedure, and site was verified.   Injection Procedure Details:  Procedure Site One Meds Administered:  Meds ordered this encounter  Medications  . methylPREDNISolone acetate (DEPO-MEDROL) injection 40 mg    Laterality: Right  Location/Site:  S1 Foramen   Needle size: 22 ga.  Needle type: Spinal  Needle Placement: Transforaminal  Findings:   -Comments: Excellent flow of contrast along the nerve and into the epidural space.  Epidurogram: Contrast epidurogram showed no nerve root cut off or restricted flow pattern.  Procedure Details: After squaring off the sacral end-plate to get a true AP view, the C-arm was positioned so that the best possible view of the S1 foramen was visualized. The soft tissues overlying this structure were infiltrated with 2-3 ml. of 1% Lidocaine without Epinephrine.    The spinal needle was inserted toward the target using a "trajectory" view along the fluoroscope beam.  Under AP and lateral visualization, the needle was advanced so it did not puncture dura. Biplanar projections were used to confirm position. Aspiration was confirmed to be negative for CSF and/or blood. A 1-2 ml. volume of Isovue-250 was injected and flow of contrast was noted at each level. Radiographs were obtained for documentation purposes.   After attaining the desired flow of contrast documented above, a 0.5 to 1.0 ml test dose of 0.25% Marcaine was injected into each respective transforaminal space.  The patient was observed for 90 seconds post injection.  After no sensory deficits were reported, and normal lower extremity motor function was noted,   the above injectate was administered so that equal amounts of the injectate were placed at each foramen (level) into the transforaminal epidural  space.   Additional Comments:  The patient tolerated the procedure well Dressing: Band-Aid with 2 x 2 sterile gauze    Post-procedure details: Patient was observed during the procedure. Post-procedure instructions were reviewed.  Patient left the clinic in stable condition.     Clinical History: No specialty comments available.     Objective:  VS:  HT:    WT:   BMI:     BP:128/67  HR:(!) 101bpm  TEMP: ( )  RESP:  Physical Exam Constitutional:      General: She is not in acute distress.    Appearance: Normal appearance. She is not ill-appearing.  HENT:     Head: Normocephalic and atraumatic.     Right Ear: External ear normal.     Left Ear: External ear normal.  Eyes:     Extraocular Movements: Extraocular movements intact.  Cardiovascular:     Rate and Rhythm: Normal rate.     Pulses: Normal pulses.  Musculoskeletal:     Right lower leg: No edema.     Left lower leg: No edema.     Comments: Patient is sitting in wheelchair patient has good distal strength with no pain over the greater trochanters.  No clonus or focal weakness.  Skin:    Findings: No erythema, lesion or rash.  Neurological:     General: No focal deficit present.     Mental Status: She is alert and oriented to person, place, and time.     Sensory: No sensory deficit.     Motor: No weakness or abnormal muscle tone.     Coordination: Coordination normal.  Psychiatric:        Mood and Affect: Mood normal.        Behavior: Behavior normal.      Imaging: No results found.

## 2019-07-03 NOTE — Procedures (Signed)
S1 Lumbosacral Transforaminal Epidural Steroid Injection - Sub-Pedicular Approach with Fluoroscopic Guidance   Patient: Alexandra [name]      Date of Birth: 03/08/1927 MRN: [id number] PCP: Lawerance Cruel, MD      Visit Date: 07/03/2019   Universal Protocol:    Date/Time: 06/15/213:06 PM  Consent Given By: the patient  Position:  PRONE  Additional Comments: Vital signs were monitored before and after the procedure. Patient was prepped and draped in the usual sterile fashion. The correct patient, procedure, and site was verified.   Injection Procedure Details:  Procedure Site One Meds Administered:  Meds ordered this encounter  Medications  . methylPREDNISolone acetate (DEPO-MEDROL) injection 40 mg    Laterality: Right  Location/Site:  S1 Foramen   Needle size: 22 ga.  Needle type: Spinal  Needle Placement: Transforaminal  Findings:   -Comments: Excellent flow of contrast along the nerve and into the epidural space.  Epidurogram: Contrast epidurogram showed no nerve root cut off or restricted flow pattern.  Procedure Details: After squaring off the sacral end-plate to get a true AP view, the C-arm was positioned so that the best possible view of the S1 foramen was visualized. The soft tissues overlying this structure were infiltrated with 2-3 ml. of 1% Lidocaine without Epinephrine.    The spinal needle was inserted toward the target using a "trajectory" view along the fluoroscope beam.  Under AP and lateral visualization, the needle was advanced so it did not puncture dura. Biplanar projections were used to confirm position. Aspiration was confirmed to be negative for CSF and/or blood. A 1-2 ml. volume of Isovue-250 was injected and flow of contrast was noted at each level. Radiographs were obtained for documentation purposes.   After attaining the desired flow of contrast documented above, a 0.5 to 1.0 ml test dose of 0.25% Marcaine was injected into each  respective transforaminal space.  The patient was observed for 90 seconds post injection.  After no sensory deficits were reported, and normal lower extremity motor function was noted,   the above injectate was administered so that equal amounts of the injectate were placed at each foramen (level) into the transforaminal epidural space.   Additional Comments:  The patient tolerated the procedure well Dressing: Band-Aid with 2 x 2 sterile gauze    Post-procedure details: Patient was observed during the procedure. Post-procedure instructions were reviewed.  Patient left the clinic in stable condition.

## 2019-07-04 NOTE — Telephone Encounter (Signed)
LMOM (DPR) advising that transmission shows normal device function. Advised no changes recommended at this time by Dr. Caryl Comes. Provided main office number for questions or concerns.

## 2019-07-10 DIAGNOSIS — M1711 Unilateral primary osteoarthritis, right knee: Secondary | ICD-10-CM | POA: Diagnosis not present

## 2019-07-10 DIAGNOSIS — N183 Chronic kidney disease, stage 3 unspecified: Secondary | ICD-10-CM | POA: Diagnosis not present

## 2019-07-10 DIAGNOSIS — I5022 Chronic systolic (congestive) heart failure: Secondary | ICD-10-CM | POA: Diagnosis not present

## 2019-07-10 DIAGNOSIS — I471 Supraventricular tachycardia: Secondary | ICD-10-CM | POA: Diagnosis not present

## 2019-07-10 DIAGNOSIS — M5441 Lumbago with sciatica, right side: Secondary | ICD-10-CM | POA: Diagnosis not present

## 2019-07-10 DIAGNOSIS — I130 Hypertensive heart and chronic kidney disease with heart failure and stage 1 through stage 4 chronic kidney disease, or unspecified chronic kidney disease: Secondary | ICD-10-CM | POA: Diagnosis not present

## 2019-07-10 DIAGNOSIS — I429 Cardiomyopathy, unspecified: Secondary | ICD-10-CM | POA: Diagnosis not present

## 2019-07-10 DIAGNOSIS — J45909 Unspecified asthma, uncomplicated: Secondary | ICD-10-CM | POA: Diagnosis not present

## 2019-07-10 DIAGNOSIS — D631 Anemia in chronic kidney disease: Secondary | ICD-10-CM | POA: Diagnosis not present

## 2019-07-12 ENCOUNTER — Telehealth: Payer: Self-pay | Admitting: Family Medicine

## 2019-07-12 NOTE — Telephone Encounter (Signed)
I spoke with Gracee, giving verbal orders from Dr. Junius Roads.

## 2019-07-12 NOTE — Telephone Encounter (Signed)
Please advise 

## 2019-07-12 NOTE — Telephone Encounter (Signed)
Received call from Staley (PT) with [facility] needing verbal orders to re-certify patient for HHPT 2 Wk 4 and 1 Wk 4. The number to contact Annabelle Harman is [phone]

## 2019-07-12 NOTE — Telephone Encounter (Signed)
Ok to recertify.

## 2019-07-14 DIAGNOSIS — D631 Anemia in chronic kidney disease: Secondary | ICD-10-CM | POA: Diagnosis not present

## 2019-07-14 DIAGNOSIS — M5441 Lumbago with sciatica, right side: Secondary | ICD-10-CM | POA: Diagnosis not present

## 2019-07-14 DIAGNOSIS — I5022 Chronic systolic (congestive) heart failure: Secondary | ICD-10-CM | POA: Diagnosis not present

## 2019-07-14 DIAGNOSIS — N183 Chronic kidney disease, stage 3 unspecified: Secondary | ICD-10-CM | POA: Diagnosis not present

## 2019-07-14 DIAGNOSIS — I130 Hypertensive heart and chronic kidney disease with heart failure and stage 1 through stage 4 chronic kidney disease, or unspecified chronic kidney disease: Secondary | ICD-10-CM | POA: Diagnosis not present

## 2019-07-14 DIAGNOSIS — J45909 Unspecified asthma, uncomplicated: Secondary | ICD-10-CM | POA: Diagnosis not present

## 2019-07-14 DIAGNOSIS — I429 Cardiomyopathy, unspecified: Secondary | ICD-10-CM | POA: Diagnosis not present

## 2019-07-14 DIAGNOSIS — M1711 Unilateral primary osteoarthritis, right knee: Secondary | ICD-10-CM | POA: Diagnosis not present

## 2019-07-14 DIAGNOSIS — I471 Supraventricular tachycardia: Secondary | ICD-10-CM | POA: Diagnosis not present

## 2019-07-17 DIAGNOSIS — I471 Supraventricular tachycardia: Secondary | ICD-10-CM | POA: Diagnosis not present

## 2019-07-17 DIAGNOSIS — I429 Cardiomyopathy, unspecified: Secondary | ICD-10-CM | POA: Diagnosis not present

## 2019-07-17 DIAGNOSIS — M1711 Unilateral primary osteoarthritis, right knee: Secondary | ICD-10-CM | POA: Diagnosis not present

## 2019-07-17 DIAGNOSIS — M5441 Lumbago with sciatica, right side: Secondary | ICD-10-CM | POA: Diagnosis not present

## 2019-07-17 DIAGNOSIS — I5022 Chronic systolic (congestive) heart failure: Secondary | ICD-10-CM | POA: Diagnosis not present

## 2019-07-17 DIAGNOSIS — I130 Hypertensive heart and chronic kidney disease with heart failure and stage 1 through stage 4 chronic kidney disease, or unspecified chronic kidney disease: Secondary | ICD-10-CM | POA: Diagnosis not present

## 2019-07-17 DIAGNOSIS — N183 Chronic kidney disease, stage 3 unspecified: Secondary | ICD-10-CM | POA: Diagnosis not present

## 2019-07-17 DIAGNOSIS — D631 Anemia in chronic kidney disease: Secondary | ICD-10-CM | POA: Diagnosis not present

## 2019-07-17 DIAGNOSIS — J45909 Unspecified asthma, uncomplicated: Secondary | ICD-10-CM | POA: Diagnosis not present

## 2019-07-19 DIAGNOSIS — D631 Anemia in chronic kidney disease: Secondary | ICD-10-CM | POA: Diagnosis not present

## 2019-07-19 DIAGNOSIS — I5022 Chronic systolic (congestive) heart failure: Secondary | ICD-10-CM | POA: Diagnosis not present

## 2019-07-19 DIAGNOSIS — M5441 Lumbago with sciatica, right side: Secondary | ICD-10-CM | POA: Diagnosis not present

## 2019-07-19 DIAGNOSIS — I130 Hypertensive heart and chronic kidney disease with heart failure and stage 1 through stage 4 chronic kidney disease, or unspecified chronic kidney disease: Secondary | ICD-10-CM | POA: Diagnosis not present

## 2019-07-19 DIAGNOSIS — I471 Supraventricular tachycardia: Secondary | ICD-10-CM | POA: Diagnosis not present

## 2019-07-19 DIAGNOSIS — I429 Cardiomyopathy, unspecified: Secondary | ICD-10-CM | POA: Diagnosis not present

## 2019-07-19 DIAGNOSIS — J45909 Unspecified asthma, uncomplicated: Secondary | ICD-10-CM | POA: Diagnosis not present

## 2019-07-19 DIAGNOSIS — M1711 Unilateral primary osteoarthritis, right knee: Secondary | ICD-10-CM | POA: Diagnosis not present

## 2019-07-19 DIAGNOSIS — N183 Chronic kidney disease, stage 3 unspecified: Secondary | ICD-10-CM | POA: Diagnosis not present

## 2019-07-24 DIAGNOSIS — I130 Hypertensive heart and chronic kidney disease with heart failure and stage 1 through stage 4 chronic kidney disease, or unspecified chronic kidney disease: Secondary | ICD-10-CM | POA: Diagnosis not present

## 2019-07-24 DIAGNOSIS — J45909 Unspecified asthma, uncomplicated: Secondary | ICD-10-CM | POA: Diagnosis not present

## 2019-07-24 DIAGNOSIS — N183 Chronic kidney disease, stage 3 unspecified: Secondary | ICD-10-CM | POA: Diagnosis not present

## 2019-07-24 DIAGNOSIS — M5441 Lumbago with sciatica, right side: Secondary | ICD-10-CM | POA: Diagnosis not present

## 2019-07-24 DIAGNOSIS — I5022 Chronic systolic (congestive) heart failure: Secondary | ICD-10-CM | POA: Diagnosis not present

## 2019-07-24 DIAGNOSIS — I471 Supraventricular tachycardia: Secondary | ICD-10-CM | POA: Diagnosis not present

## 2019-07-24 DIAGNOSIS — D631 Anemia in chronic kidney disease: Secondary | ICD-10-CM | POA: Diagnosis not present

## 2019-07-24 DIAGNOSIS — I429 Cardiomyopathy, unspecified: Secondary | ICD-10-CM | POA: Diagnosis not present

## 2019-07-24 DIAGNOSIS — M1711 Unilateral primary osteoarthritis, right knee: Secondary | ICD-10-CM | POA: Diagnosis not present

## 2019-07-26 DIAGNOSIS — M1711 Unilateral primary osteoarthritis, right knee: Secondary | ICD-10-CM | POA: Diagnosis not present

## 2019-07-26 DIAGNOSIS — N183 Chronic kidney disease, stage 3 unspecified: Secondary | ICD-10-CM | POA: Diagnosis not present

## 2019-07-26 DIAGNOSIS — I429 Cardiomyopathy, unspecified: Secondary | ICD-10-CM | POA: Diagnosis not present

## 2019-07-26 DIAGNOSIS — I5022 Chronic systolic (congestive) heart failure: Secondary | ICD-10-CM | POA: Diagnosis not present

## 2019-07-26 DIAGNOSIS — I130 Hypertensive heart and chronic kidney disease with heart failure and stage 1 through stage 4 chronic kidney disease, or unspecified chronic kidney disease: Secondary | ICD-10-CM | POA: Diagnosis not present

## 2019-07-26 DIAGNOSIS — D631 Anemia in chronic kidney disease: Secondary | ICD-10-CM | POA: Diagnosis not present

## 2019-07-26 DIAGNOSIS — J45909 Unspecified asthma, uncomplicated: Secondary | ICD-10-CM | POA: Diagnosis not present

## 2019-07-26 DIAGNOSIS — I471 Supraventricular tachycardia: Secondary | ICD-10-CM | POA: Diagnosis not present

## 2019-07-26 DIAGNOSIS — M5441 Lumbago with sciatica, right side: Secondary | ICD-10-CM | POA: Diagnosis not present

## 2019-07-27 DIAGNOSIS — E039 Hypothyroidism, unspecified: Secondary | ICD-10-CM | POA: Diagnosis not present

## 2019-07-27 DIAGNOSIS — M179 Osteoarthritis of knee, unspecified: Secondary | ICD-10-CM | POA: Diagnosis not present

## 2019-07-27 DIAGNOSIS — N184 Chronic kidney disease, stage 4 (severe): Secondary | ICD-10-CM | POA: Diagnosis not present

## 2019-07-27 DIAGNOSIS — I509 Heart failure, unspecified: Secondary | ICD-10-CM | POA: Diagnosis not present

## 2019-07-27 DIAGNOSIS — Z853 Personal history of malignant neoplasm of breast: Secondary | ICD-10-CM | POA: Diagnosis not present

## 2019-07-27 DIAGNOSIS — J4520 Mild intermittent asthma, uncomplicated: Secondary | ICD-10-CM | POA: Diagnosis not present

## 2019-07-27 DIAGNOSIS — I10 Essential (primary) hypertension: Secondary | ICD-10-CM | POA: Diagnosis not present

## 2019-07-27 DIAGNOSIS — D649 Anemia, unspecified: Secondary | ICD-10-CM | POA: Diagnosis not present

## 2019-07-30 ENCOUNTER — Ambulatory Visit (INDEPENDENT_AMBULATORY_CARE_PROVIDER_SITE_OTHER): Payer: Medicare HMO

## 2019-07-30 DIAGNOSIS — Z950 Presence of cardiac pacemaker: Secondary | ICD-10-CM

## 2019-07-30 DIAGNOSIS — I5022 Chronic systolic (congestive) heart failure: Secondary | ICD-10-CM

## 2019-08-01 DIAGNOSIS — I5022 Chronic systolic (congestive) heart failure: Secondary | ICD-10-CM

## 2019-08-01 DIAGNOSIS — Z950 Presence of cardiac pacemaker: Secondary | ICD-10-CM | POA: Diagnosis not present

## 2019-08-01 NOTE — Progress Notes (Signed)
EPIC Encounter for ICM Monitoring  Patient Name: Alexandra [name] is a 86 y.o. female Date: 08/01/2019 Primary Care Physican: Lawerance Cruel, MD Primary Cardiologist:Skains/Bensimhon Electrophysiologist: Vergie Living Pacing: 96% 6/9/2021Weight: 170 lbs   Spoke with patient and she is feeling well.   She had company over the holiday weekend and ate foods that were higher in salt.   CorvueThoracic impedancenormal but was suggesting possible fluid accumulation from 7/8-7/10.  Prescribed:  Furosemide 40 mgTake one tablet (40 mg) by mouth once every other day. May take extra as needed.  Potassium 10 mEq 1 tablet daily.  LABS: 08/04/2018 Creatinine 1.79, BUN 34, Potassium 4.0, Sodium 137, GFR 25-29 03/10/2018 Creatinine 1.61, BUN 27, Potassium 4.3, Sodium 138, GFR 28-33 01/27/2018 Creatinine 1.55, BUN 27, Potassium 4.8, Sodium 141, GFR 30-34  Recommendations: No changes and encouraged to call if experiencing any fluid symptoms.  Follow-up plan: ICM clinic phone appointment on8/16/2021.91 day device clinic remote transmission8/30/2021.  Copy of ICM check sent to [place]   EP/Cardiology Office Visits: Recall for 08/04/2019 with Dr. Haroldine Laws and 02/13/2020 with Dr Caryl Comes.    Copy of ICM check sent to Dr. Caryl Comes.   3 month ICM trend: 07/30/2019    1 Year ICM trend:       [name], RN 08/01/2019 3:36 PM

## 2019-08-02 DIAGNOSIS — I130 Hypertensive heart and chronic kidney disease with heart failure and stage 1 through stage 4 chronic kidney disease, or unspecified chronic kidney disease: Secondary | ICD-10-CM | POA: Diagnosis not present

## 2019-08-02 DIAGNOSIS — M1711 Unilateral primary osteoarthritis, right knee: Secondary | ICD-10-CM | POA: Diagnosis not present

## 2019-08-02 DIAGNOSIS — J45909 Unspecified asthma, uncomplicated: Secondary | ICD-10-CM | POA: Diagnosis not present

## 2019-08-02 DIAGNOSIS — D631 Anemia in chronic kidney disease: Secondary | ICD-10-CM | POA: Diagnosis not present

## 2019-08-02 DIAGNOSIS — I429 Cardiomyopathy, unspecified: Secondary | ICD-10-CM | POA: Diagnosis not present

## 2019-08-02 DIAGNOSIS — N183 Chronic kidney disease, stage 3 unspecified: Secondary | ICD-10-CM | POA: Diagnosis not present

## 2019-08-02 DIAGNOSIS — M5441 Lumbago with sciatica, right side: Secondary | ICD-10-CM | POA: Diagnosis not present

## 2019-08-02 DIAGNOSIS — I5022 Chronic systolic (congestive) heart failure: Secondary | ICD-10-CM | POA: Diagnosis not present

## 2019-08-02 DIAGNOSIS — I471 Supraventricular tachycardia: Secondary | ICD-10-CM | POA: Diagnosis not present

## 2019-08-04 ENCOUNTER — Other Ambulatory Visit (HOSPITAL_COMMUNITY): Payer: Self-pay | Admitting: Internal Medicine

## 2019-08-06 ENCOUNTER — Telehealth: Payer: Self-pay | Admitting: Family Medicine

## 2019-08-06 NOTE — Telephone Encounter (Signed)
Alexandra [name] with [facility] called wanting to make Dr.Hilts aware the pt missed her PT appt on 07/31/19.

## 2019-08-06 NOTE — Telephone Encounter (Signed)
FYI

## 2019-08-08 DIAGNOSIS — D631 Anemia in chronic kidney disease: Secondary | ICD-10-CM | POA: Diagnosis not present

## 2019-08-08 DIAGNOSIS — M5441 Lumbago with sciatica, right side: Secondary | ICD-10-CM | POA: Diagnosis not present

## 2019-08-08 DIAGNOSIS — M1711 Unilateral primary osteoarthritis, right knee: Secondary | ICD-10-CM | POA: Diagnosis not present

## 2019-08-08 DIAGNOSIS — N183 Chronic kidney disease, stage 3 unspecified: Secondary | ICD-10-CM | POA: Diagnosis not present

## 2019-08-08 DIAGNOSIS — I130 Hypertensive heart and chronic kidney disease with heart failure and stage 1 through stage 4 chronic kidney disease, or unspecified chronic kidney disease: Secondary | ICD-10-CM | POA: Diagnosis not present

## 2019-08-08 DIAGNOSIS — J45909 Unspecified asthma, uncomplicated: Secondary | ICD-10-CM | POA: Diagnosis not present

## 2019-08-08 DIAGNOSIS — I5022 Chronic systolic (congestive) heart failure: Secondary | ICD-10-CM | POA: Diagnosis not present

## 2019-08-08 DIAGNOSIS — I429 Cardiomyopathy, unspecified: Secondary | ICD-10-CM | POA: Diagnosis not present

## 2019-08-08 DIAGNOSIS — I471 Supraventricular tachycardia: Secondary | ICD-10-CM | POA: Diagnosis not present

## 2019-08-10 DIAGNOSIS — M5441 Lumbago with sciatica, right side: Secondary | ICD-10-CM | POA: Diagnosis not present

## 2019-08-10 DIAGNOSIS — I130 Hypertensive heart and chronic kidney disease with heart failure and stage 1 through stage 4 chronic kidney disease, or unspecified chronic kidney disease: Secondary | ICD-10-CM | POA: Diagnosis not present

## 2019-08-10 DIAGNOSIS — N183 Chronic kidney disease, stage 3 unspecified: Secondary | ICD-10-CM | POA: Diagnosis not present

## 2019-08-10 DIAGNOSIS — D631 Anemia in chronic kidney disease: Secondary | ICD-10-CM | POA: Diagnosis not present

## 2019-08-10 DIAGNOSIS — M1711 Unilateral primary osteoarthritis, right knee: Secondary | ICD-10-CM | POA: Diagnosis not present

## 2019-08-10 DIAGNOSIS — J45909 Unspecified asthma, uncomplicated: Secondary | ICD-10-CM | POA: Diagnosis not present

## 2019-08-10 DIAGNOSIS — I471 Supraventricular tachycardia: Secondary | ICD-10-CM | POA: Diagnosis not present

## 2019-08-10 DIAGNOSIS — I5022 Chronic systolic (congestive) heart failure: Secondary | ICD-10-CM | POA: Diagnosis not present

## 2019-08-10 DIAGNOSIS — I429 Cardiomyopathy, unspecified: Secondary | ICD-10-CM | POA: Diagnosis not present

## 2019-08-13 DIAGNOSIS — J45909 Unspecified asthma, uncomplicated: Secondary | ICD-10-CM | POA: Diagnosis not present

## 2019-08-13 DIAGNOSIS — M1711 Unilateral primary osteoarthritis, right knee: Secondary | ICD-10-CM | POA: Diagnosis not present

## 2019-08-13 DIAGNOSIS — I130 Hypertensive heart and chronic kidney disease with heart failure and stage 1 through stage 4 chronic kidney disease, or unspecified chronic kidney disease: Secondary | ICD-10-CM | POA: Diagnosis not present

## 2019-08-13 DIAGNOSIS — I429 Cardiomyopathy, unspecified: Secondary | ICD-10-CM | POA: Diagnosis not present

## 2019-08-13 DIAGNOSIS — M5441 Lumbago with sciatica, right side: Secondary | ICD-10-CM | POA: Diagnosis not present

## 2019-08-13 DIAGNOSIS — N183 Chronic kidney disease, stage 3 unspecified: Secondary | ICD-10-CM | POA: Diagnosis not present

## 2019-08-13 DIAGNOSIS — I5022 Chronic systolic (congestive) heart failure: Secondary | ICD-10-CM | POA: Diagnosis not present

## 2019-08-13 DIAGNOSIS — D631 Anemia in chronic kidney disease: Secondary | ICD-10-CM | POA: Diagnosis not present

## 2019-08-13 DIAGNOSIS — I471 Supraventricular tachycardia: Secondary | ICD-10-CM | POA: Diagnosis not present

## 2019-08-15 DIAGNOSIS — M5441 Lumbago with sciatica, right side: Secondary | ICD-10-CM | POA: Diagnosis not present

## 2019-08-15 DIAGNOSIS — I471 Supraventricular tachycardia: Secondary | ICD-10-CM | POA: Diagnosis not present

## 2019-08-15 DIAGNOSIS — I130 Hypertensive heart and chronic kidney disease with heart failure and stage 1 through stage 4 chronic kidney disease, or unspecified chronic kidney disease: Secondary | ICD-10-CM | POA: Diagnosis not present

## 2019-08-15 DIAGNOSIS — D631 Anemia in chronic kidney disease: Secondary | ICD-10-CM | POA: Diagnosis not present

## 2019-08-15 DIAGNOSIS — I5022 Chronic systolic (congestive) heart failure: Secondary | ICD-10-CM | POA: Diagnosis not present

## 2019-08-15 DIAGNOSIS — M1711 Unilateral primary osteoarthritis, right knee: Secondary | ICD-10-CM | POA: Diagnosis not present

## 2019-08-15 DIAGNOSIS — I429 Cardiomyopathy, unspecified: Secondary | ICD-10-CM | POA: Diagnosis not present

## 2019-08-15 DIAGNOSIS — J45909 Unspecified asthma, uncomplicated: Secondary | ICD-10-CM | POA: Diagnosis not present

## 2019-08-15 DIAGNOSIS — N183 Chronic kidney disease, stage 3 unspecified: Secondary | ICD-10-CM | POA: Diagnosis not present

## 2019-08-21 DIAGNOSIS — I5022 Chronic systolic (congestive) heart failure: Secondary | ICD-10-CM | POA: Diagnosis not present

## 2019-08-21 DIAGNOSIS — D631 Anemia in chronic kidney disease: Secondary | ICD-10-CM | POA: Diagnosis not present

## 2019-08-21 DIAGNOSIS — M5441 Lumbago with sciatica, right side: Secondary | ICD-10-CM | POA: Diagnosis not present

## 2019-08-21 DIAGNOSIS — I130 Hypertensive heart and chronic kidney disease with heart failure and stage 1 through stage 4 chronic kidney disease, or unspecified chronic kidney disease: Secondary | ICD-10-CM | POA: Diagnosis not present

## 2019-08-21 DIAGNOSIS — N183 Chronic kidney disease, stage 3 unspecified: Secondary | ICD-10-CM | POA: Diagnosis not present

## 2019-08-21 DIAGNOSIS — I429 Cardiomyopathy, unspecified: Secondary | ICD-10-CM | POA: Diagnosis not present

## 2019-08-21 DIAGNOSIS — I471 Supraventricular tachycardia: Secondary | ICD-10-CM | POA: Diagnosis not present

## 2019-08-21 DIAGNOSIS — M1711 Unilateral primary osteoarthritis, right knee: Secondary | ICD-10-CM | POA: Diagnosis not present

## 2019-08-21 DIAGNOSIS — J45909 Unspecified asthma, uncomplicated: Secondary | ICD-10-CM | POA: Diagnosis not present

## 2019-09-03 ENCOUNTER — Ambulatory Visit (INDEPENDENT_AMBULATORY_CARE_PROVIDER_SITE_OTHER): Payer: Medicare HMO

## 2019-09-03 DIAGNOSIS — Z950 Presence of cardiac pacemaker: Secondary | ICD-10-CM | POA: Diagnosis not present

## 2019-09-03 DIAGNOSIS — I5022 Chronic systolic (congestive) heart failure: Secondary | ICD-10-CM

## 2019-09-04 ENCOUNTER — Telehealth: Payer: Self-pay

## 2019-09-04 DIAGNOSIS — J45909 Unspecified asthma, uncomplicated: Secondary | ICD-10-CM | POA: Diagnosis not present

## 2019-09-04 DIAGNOSIS — M5441 Lumbago with sciatica, right side: Secondary | ICD-10-CM | POA: Diagnosis not present

## 2019-09-04 DIAGNOSIS — N183 Chronic kidney disease, stage 3 unspecified: Secondary | ICD-10-CM | POA: Diagnosis not present

## 2019-09-04 DIAGNOSIS — M1711 Unilateral primary osteoarthritis, right knee: Secondary | ICD-10-CM | POA: Diagnosis not present

## 2019-09-04 DIAGNOSIS — D631 Anemia in chronic kidney disease: Secondary | ICD-10-CM | POA: Diagnosis not present

## 2019-09-04 DIAGNOSIS — I5022 Chronic systolic (congestive) heart failure: Secondary | ICD-10-CM | POA: Diagnosis not present

## 2019-09-04 DIAGNOSIS — I130 Hypertensive heart and chronic kidney disease with heart failure and stage 1 through stage 4 chronic kidney disease, or unspecified chronic kidney disease: Secondary | ICD-10-CM | POA: Diagnosis not present

## 2019-09-04 DIAGNOSIS — I429 Cardiomyopathy, unspecified: Secondary | ICD-10-CM | POA: Diagnosis not present

## 2019-09-04 DIAGNOSIS — I471 Supraventricular tachycardia: Secondary | ICD-10-CM | POA: Diagnosis not present

## 2019-09-04 NOTE — Telephone Encounter (Signed)
Flores from [facility] called in to get a prescription for  upright walker for patient . Says you can call  to [place]  [phone]

## 2019-09-04 NOTE — Telephone Encounter (Signed)
Ok to approve 

## 2019-09-04 NOTE — Progress Notes (Signed)
EPIC Encounter for ICM Monitoring  Patient Name: Alexandra [name] is a 86 y.o. female Date: 09/04/2019 Primary Care Physican: Lawerance Cruel, MD Primary Cardiologist:Skains/Bensimhon Electrophysiologist: Vergie Living Pacing: 96% 8/17/2021Weight: 170 lbs   Spoke with patient and she is feeling well.  She was at the beach during decreased impedance and did not take Furosemide for a week.  CorvueThoracic impedancesuggesting possible fluid accumulation starting approximately 8/5 but returning close to baseline.  Prescribed:  Furosemide 40 mgTake one tablet (40 mg) by mouth once every other day. May take extra as needed.  Potassium 10 mEq 1 tablet daily.  LABS: 08/04/2018 Creatinine 1.79, BUN 34, Potassium 4.0, Sodium 137, GFR 25-29 03/10/2018 Creatinine 1.61, BUN 27, Potassium 4.3, Sodium 138, GFR 28-33 01/27/2018 Creatinine 1.55, BUN 27, Potassium 4.8, Sodium 141, GFR 30-34  Recommendations:  Advised to take Furosemide 40 mg x 3 consecutive days and then return to every other day.  Follow-up plan: ICM clinic phone appointment on8/25/2021.91 day device clinic remote transmission8/30/2021.  Copy of ICM check sent to Dr.Klein and Dr Haroldine Laws   EP/Cardiology Office Visits: Recall for 08/04/2019 with Dr. Haroldine Laws and 02/13/2020 with Dr Caryl Comes.    Copy of ICM check sent to Dr. Caryl Comes.  3 month ICM trend: 09/03/2019    1 Year ICM trend:       [name], RN 09/04/2019 2:07 PM

## 2019-09-04 NOTE — Telephone Encounter (Signed)
Waited on hold with [place] over 12 minutes. Could not wait longer at this time. Will try again later.

## 2019-09-12 ENCOUNTER — Other Ambulatory Visit: Payer: Self-pay | Admitting: Radiology

## 2019-09-12 ENCOUNTER — Ambulatory Visit (INDEPENDENT_AMBULATORY_CARE_PROVIDER_SITE_OTHER): Payer: Medicare HMO

## 2019-09-12 DIAGNOSIS — Z950 Presence of cardiac pacemaker: Secondary | ICD-10-CM

## 2019-09-12 DIAGNOSIS — I5022 Chronic systolic (congestive) heart failure: Secondary | ICD-10-CM

## 2019-09-12 NOTE — Telephone Encounter (Signed)
I called [place] and got hte fax number [phone] and faxed the order along with her demographics

## 2019-09-12 NOTE — Telephone Encounter (Signed)
Would you please help me with this one?

## 2019-09-14 NOTE — Progress Notes (Signed)
EPIC Encounter for ICM Monitoring  Patient Name: Alexandra [name] is a 86 y.o. female Date: 09/14/2019 Primary Care Physican: Lawerance Cruel, MD Primary Cardiologist:Skains/Bensimhon Electrophysiologist: Vergie Living Pacing: 96% 8/17/2021Weight: 170 lbs   Spoke with patient and reports feeling well at this time.  Denies fluid symptoms.    CorvueThoracic impedancereturned to normal after taking 1 day of extra Furosemide.  Prescribed:  Furosemide 40 mgTake one tablet (40 mg) by mouth once every other day. May take extra as needed.  Potassium 10 mEq 1 tablet daily.  LABS: 08/04/2018 Creatinine 1.79, BUN 34, Potassium 4.0, Sodium 137, GFR 25-29 03/10/2018 Creatinine 1.61, BUN 27, Potassium 4.3, Sodium 138, GFR 28-33 01/27/2018 Creatinine 1.55, BUN 27, Potassium 4.8, Sodium 141, GFR 30-34  Recommendations:No changes and encouraged to call if experiencing any fluid symptoms.  Follow-up plan: ICM clinic phone appointment on9/20/2021.91 day device clinic remote transmission8/30/2021.  Copy of ICM check sent to Dr.Klein and Dr Haroldine Laws for FYI that impedance returned to normal.  EP/Cardiology Office Visits:Recall for 08/04/2019 with Dr.Bensimhon and 02/13/2020 with Dr Caryl Comes.   Copy of ICM check sent to [place].   3 month ICM trend: 09/12/2019    1 Year ICM trend:       [name], RN 09/14/2019 11:26 AM

## 2019-09-17 ENCOUNTER — Ambulatory Visit (INDEPENDENT_AMBULATORY_CARE_PROVIDER_SITE_OTHER): Payer: Medicare HMO | Admitting: *Deleted

## 2019-09-17 DIAGNOSIS — I429 Cardiomyopathy, unspecified: Secondary | ICD-10-CM | POA: Diagnosis not present

## 2019-09-17 LAB — CUP PACEART REMOTE DEVICE CHECK
Battery Remaining Longevity: 53 mo
Battery Remaining Percentage: 63 %
Battery Voltage: 2.9 V
Brady Statistic AP VP Percent: 1 %
Brady Statistic AP VS Percent: 1 %
Brady Statistic AS VP Percent: 95 %
Brady Statistic AS VS Percent: 4 %
Brady Statistic RA Percent Paced: 1 %
Date Time Interrogation Session: 20210830020025
Implantable Lead Implant Date: 20161229
Implantable Lead Implant Date: 20161229
Implantable Lead Implant Date: 20161229
Implantable Lead Location: 753858
Implantable Lead Location: 753859
Implantable Lead Location: 753860
Implantable Pulse Generator Implant Date: 20161229
Lead Channel Impedance Value: 1025 Ohm
Lead Channel Impedance Value: 430 Ohm
Lead Channel Impedance Value: 440 Ohm
Lead Channel Pacing Threshold Amplitude: 0.75 V
Lead Channel Pacing Threshold Amplitude: 0.75 V
Lead Channel Pacing Threshold Amplitude: 0.875 V
Lead Channel Pacing Threshold Pulse Width: 0.5 ms
Lead Channel Pacing Threshold Pulse Width: 0.5 ms
Lead Channel Pacing Threshold Pulse Width: 1.5 ms
Lead Channel Sensing Intrinsic Amplitude: 12 mV
Lead Channel Sensing Intrinsic Amplitude: 2.6 mV
Lead Channel Setting Pacing Amplitude: 1.75 V
Lead Channel Setting Pacing Amplitude: 2 V
Lead Channel Setting Pacing Amplitude: 2 V
Lead Channel Setting Pacing Pulse Width: 0.5 ms
Lead Channel Setting Pacing Pulse Width: 1.5 ms
Lead Channel Setting Sensing Sensitivity: 2 mV
Pulse Gen Model: 3262
Pulse Gen Serial Number: 7834528

## 2019-09-19 NOTE — Progress Notes (Signed)
Remote pacemaker transmission.   

## 2019-10-05 DIAGNOSIS — E039 Hypothyroidism, unspecified: Secondary | ICD-10-CM | POA: Diagnosis not present

## 2019-10-05 DIAGNOSIS — D649 Anemia, unspecified: Secondary | ICD-10-CM | POA: Diagnosis not present

## 2019-10-05 DIAGNOSIS — M179 Osteoarthritis of knee, unspecified: Secondary | ICD-10-CM | POA: Diagnosis not present

## 2019-10-05 DIAGNOSIS — N183 Chronic kidney disease, stage 3 unspecified: Secondary | ICD-10-CM | POA: Diagnosis not present

## 2019-10-05 DIAGNOSIS — I509 Heart failure, unspecified: Secondary | ICD-10-CM | POA: Diagnosis not present

## 2019-10-05 DIAGNOSIS — I10 Essential (primary) hypertension: Secondary | ICD-10-CM | POA: Diagnosis not present

## 2019-10-05 DIAGNOSIS — J4520 Mild intermittent asthma, uncomplicated: Secondary | ICD-10-CM | POA: Diagnosis not present

## 2019-10-05 DIAGNOSIS — N184 Chronic kidney disease, stage 4 (severe): Secondary | ICD-10-CM | POA: Diagnosis not present

## 2019-10-05 DIAGNOSIS — Z853 Personal history of malignant neoplasm of breast: Secondary | ICD-10-CM | POA: Diagnosis not present

## 2019-10-08 ENCOUNTER — Ambulatory Visit (INDEPENDENT_AMBULATORY_CARE_PROVIDER_SITE_OTHER): Payer: Medicare HMO

## 2019-10-08 DIAGNOSIS — I5022 Chronic systolic (congestive) heart failure: Secondary | ICD-10-CM

## 2019-10-08 DIAGNOSIS — Z950 Presence of cardiac pacemaker: Secondary | ICD-10-CM | POA: Diagnosis not present

## 2019-10-08 NOTE — Progress Notes (Signed)
EPIC Encounter for ICM Monitoring  Patient Name: Alexandra [name] is a 86 y.o. female Date: 10/08/2019 Primary Care Physican: Lawerance Cruel, MD Primary Cardiologist:Skains/Bensimhon Electrophysiologist: Vergie Living Pacing: 96% 9/20/2021Weight: 170lbs   Spoke with patient and reports feeling well at this time.  Denies fluid symptoms.   Discussed limiting salt and fluid intake  CorvueThoracic impedancesuggesting possible fluid accumulation since 10/05/2019.  Prescribed:  Furosemide 40 mgTake one tablet (40 mg) by mouth once every other day. May take extra as needed.  Potassium 10 mEq 1 tablet daily.  LABS: 08/04/2018 Creatinine 1.79, BUN 34, Potassium 4.0, Sodium 137, GFR 25-29 03/10/2018 Creatinine 1.61, BUN 27, Potassium 4.3, Sodium 138, GFR 28-33 01/27/2018 Creatinine 1.55, BUN 27, Potassium 4.8, Sodium 141, GFR 30-34  Recommendations: Advised to take Furosemide 1 tablet x 3 consecutive days and then return to every other day.  Follow-up plan: ICM clinic phone appointment on9/24/2021 to recheck fluid levels.91 day device clinic remote transmission11/29/2021.  Copy of ICM check sent to Dr.Klein and Dr Haroldine Laws for [facility].  EP/Cardiology Office Visits:Recall for 08/04/2019 with Dr.Bensimhon and 02/13/2020 with Dr Caryl Comes.   Copy of ICM check sent to Dr.Klein and Dr Haroldine Laws for [facility].   3 month ICM trend: 10/08/2019    1 Year ICM trend:       [name], RN 10/08/2019 2:45 PM

## 2019-10-12 ENCOUNTER — Ambulatory Visit (INDEPENDENT_AMBULATORY_CARE_PROVIDER_SITE_OTHER): Payer: Medicare HMO

## 2019-10-12 DIAGNOSIS — Z950 Presence of cardiac pacemaker: Secondary | ICD-10-CM

## 2019-10-12 DIAGNOSIS — I5022 Chronic systolic (congestive) heart failure: Secondary | ICD-10-CM

## 2019-10-12 NOTE — Progress Notes (Signed)
EPIC Encounter for ICM Monitoring  Patient Name: Alexandra [name] is a 86 y.o. female Date: 10/12/2019 Primary Care Physican: Lawerance Cruel, MD Primary Cardiologist:Skains/Bensimhon Electrophysiologist: Vergie Living Pacing: 96% 9/20/2021Weight: 170lbs   Attempted call to patient and unable to reach.   Transmission reviewed.   CorvueThoracic impedancesuggesting fluid levels returned to normal after taking Furosemide 40 mg x 3 consecutive days.  Prescribed:  Furosemide 40 mgTake one tablet (40 mg) by mouth once every other day. May take extra as needed.  Potassium 10 mEq 1 tablet daily.  LABS: 08/04/2018 Creatinine 1.79, BUN 34, Potassium 4.0, Sodium 137, GFR 25-29 03/10/2018 Creatinine 1.61, BUN 27, Potassium 4.3, Sodium 138, GFR 28-33 01/27/2018 Creatinine 1.55, BUN 27, Potassium 4.8, Sodium 141, GFR 30-34  Recommendations: Unable to reach.    Follow-up plan: ICM clinic phone appointment on10/25/2021.91 day device clinic remote transmission11/29/2021.  Copy of ICM check sent to Dr.Kleinand Dr Haroldine Laws for [place].  EP/Cardiology Office Visits:Recall for 08/04/2019 with Dr.Bensimhon and 02/13/2020 with Dr Caryl Comes.   Copy of ICM check sent to Dr.Klein and Dr Haroldine Laws for [facility].   3 month ICM trend: 10/12/2019    1 Year ICM trend:       [name], RN 10/12/2019 3:05 PM

## 2019-10-30 ENCOUNTER — Telehealth: Payer: Self-pay | Admitting: Internal Medicine

## 2019-10-30 ENCOUNTER — Other Ambulatory Visit (HOSPITAL_COMMUNITY): Payer: Self-pay | Admitting: Internal Medicine

## 2019-10-30 NOTE — Telephone Encounter (Signed)
Received remote transmission.   Spoke with patient and reviewed transmission.    She said she does not think her symptoms are fluid related because the shortness of breath and extreme tiredness has been ongoing for the last week.  She said she is sleeping a lot.  She has not weighed recently but does not feel like she has gained any weight.   She confirmed she is taking Furosemide 40 mg every other day.  Reminded her she can take extra as prescribed when needed but she has not taken any extra because does not feel her symptoms are fluid related.     ICM 3 month Trend 10/30/2019.    Corvue impedance suggesting the start of possible fluid accumulation 10/29/2019.     Information forwarded to Dr Haroldine Laws,  Dr Caryl Comes and nurse Keitha Butte, RN for review and recommendations.

## 2019-10-30 NOTE — Telephone Encounter (Signed)
Spoke with patient.  Advised that Dr Caryl Comes did not think the amount of fluid showing on the report was very impressive given the symptoms she is reporting.  Advised since the Furosemide is prescribed that she can take extra then she may want to take an extra one to see if that helps her symptoms.  Explained I have routed the conversation to Dr Bensimhon's office as well for review.    Encouraged her to call Dr Bensimhon's office or PCP tomorrow morning if she has not received a call back.  Advised I will be out of the office the rest of the week but if symptoms become urgent to call 911 or go to ER.    She reports she will try taking extra Furosemide and call Dr Haroldine Laws or PCP office tomorrow morning for follow up.

## 2019-10-30 NOTE — Telephone Encounter (Signed)
L I dont think the corvue is very impressive  May have to bark up antoher tree

## 2019-10-30 NOTE — Telephone Encounter (Signed)
Pt c/o Shortness Of Breath: STAT if SOB developed within the last 24 hours or pt is noticeably SOB on the phone  1. Are you currently SOB (can you hear that pt is SOB on the phone)?   Not right now   2. How long have you been experiencing SOB?   For about 3 to 4 days  3. Are you SOB when sitting or when up moving around?   After walking around  4. Are you currently experiencing any other symptoms?    Patient states she doesn't understand why she is so tired and sleeping so much

## 2019-10-30 NOTE — Telephone Encounter (Signed)
Spoke with pt who is complaining of dyspnea on exertion and increased fatigue and sleeping.  Pt denies edema and CP at this time but states "this is the worst she has felt in 6-7 years."  Requested pt send a device transmission.  Will forward information to pt's care providers for further review and recommendations.

## 2019-10-31 NOTE — Telephone Encounter (Signed)
Spoke with pt who reports she did take extra Furosemide this am.  Pt continues to complain of fatigue.  Pt has not called her PCP or CHF clinic for further evaluation.  Pt denies current CP or SOB.  Reports adequate output.  Pt has not been evaluated by her PCP since April or May.  Encouraged pt to contact her PCP to schedule f/u as well as Dr Bensimhon's office.  Pt verbalizes understanding and is agrees with current plan.

## 2019-11-12 ENCOUNTER — Ambulatory Visit (INDEPENDENT_AMBULATORY_CARE_PROVIDER_SITE_OTHER): Payer: Medicare HMO

## 2019-11-12 DIAGNOSIS — Z950 Presence of cardiac pacemaker: Secondary | ICD-10-CM | POA: Diagnosis not present

## 2019-11-12 DIAGNOSIS — I5022 Chronic systolic (congestive) heart failure: Secondary | ICD-10-CM | POA: Diagnosis not present

## 2019-11-14 NOTE — Progress Notes (Signed)
EPIC Encounter for ICM Monitoring  Patient Name: Alexandra [name] is a 86 y.o. female Date: 11/14/2019 Primary Care Physican: Lawerance Cruel, MD Primary Cardiologist:Skains/Bensimhon Electrophysiologist: Vergie Living Pacing: 96% 10/27/2021Weight: 170lbs   Spoke with patient and reports feeling well at this time.  Denies fluid symptoms.  She reports feeling frustrated with diet and trying to limit salt.  Her BP drops low when taking extra Furosemide.  Offered to get an appointment set up with Dr Haroldine Laws since she is overdue and she declined.  She prefers to wait until January to schedule with him.  Discussed dietary salt intake.  She said people are brining her food so that may be where she is getting the extra salt.   CorvueThoracic impedancesuggesting fluid levels returned to normal after taking Furosemide 40 mg x 3 consecutive days.  Prescribed:  Furosemide 40 mgTake one tablet (40 mg) by mouth once every other day. May take extra as needed.  Potassium 10 mEq 1 tablet daily.  LABS: 08/04/2018 Creatinine 1.79, BUN 34, Potassium 4.0, Sodium 137, GFR 25-29 03/10/2018 Creatinine 1.61, BUN 27, Potassium 4.3, Sodium 138, GFR 28-33 01/27/2018 Creatinine 1.55, BUN 27, Potassium 4.8, Sodium 141, GFR 30-34  Recommendations: Advised to reviewed food labels for salt content and to call if she develops any fluid symptoms.  She does not want to take extra Furosemide due to drop in BP.  Follow-up plan: ICM clinic phone appointment on11/08/2019 to recheck fluid levels.91 day device clinic remote transmission11/29/2021.  EP/Cardiology Office Visits:Recall for 08/04/2019 with Dr.Bensimhon and 02/13/2020 with Dr Caryl Comes.   Copy of ICM check sent to Dr.Kleinand Dr Haroldine Laws for [place].    3 month ICM trend: 11/13/2019    1 Year ICM trend:       [name], RN 11/14/2019 10:53 AM

## 2019-11-26 ENCOUNTER — Ambulatory Visit (INDEPENDENT_AMBULATORY_CARE_PROVIDER_SITE_OTHER): Payer: Medicare HMO

## 2019-11-26 DIAGNOSIS — Z950 Presence of cardiac pacemaker: Secondary | ICD-10-CM

## 2019-11-26 DIAGNOSIS — I5022 Chronic systolic (congestive) heart failure: Secondary | ICD-10-CM

## 2019-11-28 NOTE — Progress Notes (Signed)
EPIC Encounter for ICM Monitoring  Patient Name: Alexandra [name] is a 86 y.o. female Date: 11/28/2019 Primary Care Physican: Lawerance Cruel, MD Primary Cardiologist:Skains/Bensimhon Electrophysiologist: Vergie Living Pacing: 96% 10/27/2021Weight: 170lbs   Spoke with patient and reports feeling well at this time.  Denies fluid symptoms.     CorvueThoracic impedancesuggesting fluid levels returned to normal.  Prescribed:  Furosemide 40 mgTake one tablet (40 mg) by mouth once every other day. May take extra as needed.  Potassium 10 mEq 1 tablet daily.  LABS: 08/04/2018 Creatinine 1.79, BUN 34, Potassium 4.0, Sodium 137, GFR 25-29 03/10/2018 Creatinine 1.61, BUN 27, Potassium 4.3, Sodium 138, GFR 28-33 01/27/2018 Creatinine 1.55, BUN 27, Potassium 4.8, Sodium 141, GFR 30-34  Recommendations:  No changes and encouraged to call if experiencing any fluid symptoms.  Follow-up plan: ICM clinic phone appointment on11/30/2021.91 day device clinic remote transmission11/29/2021.  EP/Cardiology Office Visits:Recall for 08/04/2019 with Dr.Bensimhon and 02/13/2020 with Dr Caryl Comes.   Copy of ICM check sent to [place]  3 month ICM trend: 11/28/2019    1 Year ICM trend:       [name], RN 11/28/2019 4:42 PM

## 2019-11-30 DIAGNOSIS — J4520 Mild intermittent asthma, uncomplicated: Secondary | ICD-10-CM | POA: Diagnosis not present

## 2019-11-30 DIAGNOSIS — Z853 Personal history of malignant neoplasm of breast: Secondary | ICD-10-CM | POA: Diagnosis not present

## 2019-11-30 DIAGNOSIS — E039 Hypothyroidism, unspecified: Secondary | ICD-10-CM | POA: Diagnosis not present

## 2019-11-30 DIAGNOSIS — I10 Essential (primary) hypertension: Secondary | ICD-10-CM | POA: Diagnosis not present

## 2019-11-30 DIAGNOSIS — I509 Heart failure, unspecified: Secondary | ICD-10-CM | POA: Diagnosis not present

## 2019-11-30 DIAGNOSIS — K219 Gastro-esophageal reflux disease without esophagitis: Secondary | ICD-10-CM | POA: Diagnosis not present

## 2019-11-30 DIAGNOSIS — N184 Chronic kidney disease, stage 4 (severe): Secondary | ICD-10-CM | POA: Diagnosis not present

## 2019-11-30 DIAGNOSIS — D649 Anemia, unspecified: Secondary | ICD-10-CM | POA: Diagnosis not present

## 2019-11-30 DIAGNOSIS — M8580 Other specified disorders of bone density and structure, unspecified site: Secondary | ICD-10-CM | POA: Diagnosis not present

## 2019-12-16 LAB — CUP PACEART REMOTE DEVICE CHECK
Battery Remaining Longevity: 42 mo
Battery Remaining Percentage: 50 %
Battery Voltage: 2.87 V
Brady Statistic AP VP Percent: 1.1 %
Brady Statistic AP VS Percent: 1 %
Brady Statistic AS VP Percent: 95 %
Brady Statistic AS VS Percent: 4.2 %
Brady Statistic RA Percent Paced: 1 %
Date Time Interrogation Session: 20211128043742
Implantable Lead Implant Date: 20161229
Implantable Lead Implant Date: 20161229
Implantable Lead Implant Date: 20161229
Implantable Lead Location: 753858
Implantable Lead Location: 753859
Implantable Lead Location: 753860
Implantable Pulse Generator Implant Date: 20161229
Lead Channel Impedance Value: 1000 Ohm
Lead Channel Impedance Value: 400 Ohm
Lead Channel Impedance Value: 430 Ohm
Lead Channel Pacing Threshold Amplitude: 0.625 V
Lead Channel Pacing Threshold Amplitude: 0.75 V
Lead Channel Pacing Threshold Amplitude: 0.75 V
Lead Channel Pacing Threshold Pulse Width: 0.5 ms
Lead Channel Pacing Threshold Pulse Width: 0.5 ms
Lead Channel Pacing Threshold Pulse Width: 1.5 ms
Lead Channel Sensing Intrinsic Amplitude: 12 mV
Lead Channel Sensing Intrinsic Amplitude: 2.3 mV
Lead Channel Setting Pacing Amplitude: 1.625
Lead Channel Setting Pacing Amplitude: 2 V
Lead Channel Setting Pacing Amplitude: 2 V
Lead Channel Setting Pacing Pulse Width: 0.5 ms
Lead Channel Setting Pacing Pulse Width: 1.5 ms
Lead Channel Setting Sensing Sensitivity: 2 mV
Pulse Gen Model: 3262
Pulse Gen Serial Number: 7834528

## 2019-12-17 ENCOUNTER — Ambulatory Visit (INDEPENDENT_AMBULATORY_CARE_PROVIDER_SITE_OTHER): Payer: Medicare HMO

## 2019-12-17 DIAGNOSIS — I428 Other cardiomyopathies: Secondary | ICD-10-CM

## 2019-12-18 ENCOUNTER — Ambulatory Visit (INDEPENDENT_AMBULATORY_CARE_PROVIDER_SITE_OTHER): Payer: Medicare HMO

## 2019-12-18 DIAGNOSIS — Z950 Presence of cardiac pacemaker: Secondary | ICD-10-CM | POA: Diagnosis not present

## 2019-12-18 DIAGNOSIS — I5022 Chronic systolic (congestive) heart failure: Secondary | ICD-10-CM | POA: Diagnosis not present

## 2019-12-18 NOTE — Progress Notes (Signed)
EPIC Encounter for ICM Monitoring  Patient Name: Alexandra [name] is a 86 y.o. female Date: 12/18/2019 Primary Care Physican: Lawerance Cruel, MD Primary Cardiologist:Skains/Bensimhon Electrophysiologist: Vergie Living Pacing: 96% 10/27/2021Weight: 170lbs   Spoke with patient and reports feeling well at this time. Denies fluid symptoms.  She ate foods high in salt over the holiday but resumed low salt diet.  CorvueThoracic impedancesuggesting possible fluid accumulation starting 12/15/2019 but trending back to baseline.   Prescribed:  Furosemide 40 mgTake one tablet (40 mg) by mouth once every other day. May take extra as needed.  Potassium 10 mEq 1 tablet daily.  LABS: 08/04/2018 Creatinine 1.79, BUN 34, Potassium 4.0, Sodium 137, GFR 25-29 03/10/2018 Creatinine 1.61, BUN 27, Potassium 4.3, Sodium 138, GFR 28-33 01/27/2018 Creatinine 1.55, BUN 27, Potassium 4.8, Sodium 141, GFR 30-34  Recommendations:  No changes and encouraged to call if experiencing any fluid symptoms.  Follow-up plan: ICM clinic phone appointment on1/04/2020.91 day device clinic remote transmission2/28/2022.  EP/Cardiology Office Visits:Recall for 08/04/2019 with Dr.Bensimhon and 02/13/2020 with Dr Caryl Comes.   Copy of ICM check sent to [place]   3 month ICM trend: 12/18/2019    1 Year ICM trend:       [name], RN 12/18/2019 10:45 AM

## 2019-12-21 NOTE — Progress Notes (Signed)
Remote pacemaker transmission.   

## 2020-01-22 ENCOUNTER — Ambulatory Visit (INDEPENDENT_AMBULATORY_CARE_PROVIDER_SITE_OTHER): Payer: Medicare HMO

## 2020-01-22 DIAGNOSIS — I5022 Chronic systolic (congestive) heart failure: Secondary | ICD-10-CM | POA: Diagnosis not present

## 2020-01-22 DIAGNOSIS — Z950 Presence of cardiac pacemaker: Secondary | ICD-10-CM

## 2020-01-25 NOTE — Progress Notes (Signed)
EPIC Encounter for ICM Monitoring  Patient Name: Alexandra [name] is a 87 y.o. female Date: 01/25/2020 Primary Care Physican: Lawerance Cruel, MD Primary Cardiologist:Skains/Bensimhon Electrophysiologist: Vergie Living Pacing: 96%(01/14/2020 report) 1/7/2022Weight: 165lbs   Spoke with patient and reports feeling well at this time. Denies fluid symptoms.   CorvueThoracic impedancesuggesting normal fluid levels.   Prescribed:  Furosemide 40 mgTake one tablet (40 mg) by mouth once every other day. May take extra as needed.  Potassium 10 mEq 1 tablet daily.  LABS: 08/04/2018 Creatinine 1.79, BUN 34, Potassium 4.0, Sodium 137, GFR 25-29 03/10/2018 Creatinine 1.61, BUN 27, Potassium 4.3, Sodium 138, GFR 28-33 01/27/2018 Creatinine 1.55, BUN 27, Potassium 4.8, Sodium 141, GFR 30-34  Recommendations: No changes and encouraged to call if experiencing any fluid symptoms.  Follow-up plan: ICM clinic phone appointment on2/08/2020.91 day device clinic remote transmission2/28/2022.  EP/Cardiology Office Visits:02/14/2020 with Dr.Bensimhon  Recall 02/13/2020 with Dr Caryl Comes.   Copy of ICM check sent to [place]   3 month ICM trend: 01/19/2020.    [name], RN 01/25/2020 2:52 PM

## 2020-01-26 ENCOUNTER — Other Ambulatory Visit (HOSPITAL_COMMUNITY): Payer: Self-pay | Admitting: Internal Medicine

## 2020-01-30 DIAGNOSIS — H02403 Unspecified ptosis of bilateral eyelids: Secondary | ICD-10-CM | POA: Diagnosis not present

## 2020-01-30 DIAGNOSIS — Z961 Presence of intraocular lens: Secondary | ICD-10-CM | POA: Diagnosis not present

## 2020-01-30 DIAGNOSIS — H02831 Dermatochalasis of right upper eyelid: Secondary | ICD-10-CM | POA: Diagnosis not present

## 2020-01-30 DIAGNOSIS — G514 Facial myokymia: Secondary | ICD-10-CM | POA: Diagnosis not present

## 2020-01-30 DIAGNOSIS — H10413 Chronic giant papillary conjunctivitis, bilateral: Secondary | ICD-10-CM | POA: Diagnosis not present

## 2020-01-30 DIAGNOSIS — H04123 Dry eye syndrome of bilateral lacrimal glands: Secondary | ICD-10-CM | POA: Diagnosis not present

## 2020-01-30 DIAGNOSIS — H02834 Dermatochalasis of left upper eyelid: Secondary | ICD-10-CM | POA: Diagnosis not present

## 2020-02-12 DIAGNOSIS — N184 Chronic kidney disease, stage 4 (severe): Secondary | ICD-10-CM | POA: Diagnosis not present

## 2020-02-12 DIAGNOSIS — J4520 Mild intermittent asthma, uncomplicated: Secondary | ICD-10-CM | POA: Diagnosis not present

## 2020-02-12 DIAGNOSIS — Z853 Personal history of malignant neoplasm of breast: Secondary | ICD-10-CM | POA: Diagnosis not present

## 2020-02-12 DIAGNOSIS — I10 Essential (primary) hypertension: Secondary | ICD-10-CM | POA: Diagnosis not present

## 2020-02-12 DIAGNOSIS — D649 Anemia, unspecified: Secondary | ICD-10-CM | POA: Diagnosis not present

## 2020-02-12 DIAGNOSIS — E039 Hypothyroidism, unspecified: Secondary | ICD-10-CM | POA: Diagnosis not present

## 2020-02-12 DIAGNOSIS — M8580 Other specified disorders of bone density and structure, unspecified site: Secondary | ICD-10-CM | POA: Diagnosis not present

## 2020-02-12 DIAGNOSIS — I509 Heart failure, unspecified: Secondary | ICD-10-CM | POA: Diagnosis not present

## 2020-02-12 DIAGNOSIS — K219 Gastro-esophageal reflux disease without esophagitis: Secondary | ICD-10-CM | POA: Diagnosis not present

## 2020-02-13 ENCOUNTER — Telehealth: Payer: Self-pay | Admitting: Internal Medicine

## 2020-02-13 DIAGNOSIS — Z950 Presence of cardiac pacemaker: Secondary | ICD-10-CM | POA: Diagnosis not present

## 2020-02-13 DIAGNOSIS — I428 Other cardiomyopathies: Secondary | ICD-10-CM | POA: Diagnosis not present

## 2020-02-13 DIAGNOSIS — I2699 Other pulmonary embolism without acute cor pulmonale: Secondary | ICD-10-CM | POA: Diagnosis not present

## 2020-02-13 DIAGNOSIS — N184 Chronic kidney disease, stage 4 (severe): Secondary | ICD-10-CM | POA: Diagnosis not present

## 2020-02-13 DIAGNOSIS — E559 Vitamin D deficiency, unspecified: Secondary | ICD-10-CM | POA: Diagnosis not present

## 2020-02-13 NOTE — Telephone Encounter (Signed)
Pt states she was called about one week ago and advised of appointment today with Dr Caryl Comes.  Pt advised unable to find where anyone had contacted pt but apologized for the inconvenience.  Pt has been scheduled to see Dr Caryl Comes 03/2020 and thanked RN for the callback.

## 2020-02-13 NOTE — Telephone Encounter (Signed)
Patient states she was called today and told she had an appointment today with Dr. Caryl Comes. She states she came to the office and did not have an appointment. She would like to know who called her, because getting transportation was very inconvenient.

## 2020-02-14 ENCOUNTER — Encounter (HOSPITAL_COMMUNITY): Payer: Self-pay | Admitting: Internal Medicine

## 2020-02-14 ENCOUNTER — Ambulatory Visit (HOSPITAL_COMMUNITY)
Admission: RE | Admit: 2020-02-14 | Discharge: 2020-02-14 | Disposition: A | Discharge status: Home / Self Care | Payer: Medicare HMO | Source: Ambulatory Visit | Attending: Internal Medicine | Admitting: Internal Medicine

## 2020-02-14 ENCOUNTER — Other Ambulatory Visit: Payer: Self-pay

## 2020-02-14 VITALS — BP 160/80 | HR 99 | Wt 164.2 lb

## 2020-02-14 DIAGNOSIS — I5022 Chronic systolic (congestive) heart failure: Secondary | ICD-10-CM | POA: Insufficient documentation

## 2020-02-14 DIAGNOSIS — J45909 Unspecified asthma, uncomplicated: Secondary | ICD-10-CM | POA: Insufficient documentation

## 2020-02-14 DIAGNOSIS — Z950 Presence of cardiac pacemaker: Secondary | ICD-10-CM | POA: Diagnosis not present

## 2020-02-14 DIAGNOSIS — E7800 Pure hypercholesterolemia, unspecified: Secondary | ICD-10-CM | POA: Diagnosis not present

## 2020-02-14 DIAGNOSIS — Z7901 Long term (current) use of anticoagulants: Secondary | ICD-10-CM | POA: Diagnosis not present

## 2020-02-14 DIAGNOSIS — I447 Left bundle-branch block, unspecified: Secondary | ICD-10-CM | POA: Insufficient documentation

## 2020-02-14 DIAGNOSIS — Z86711 Personal history of pulmonary embolism: Secondary | ICD-10-CM | POA: Insufficient documentation

## 2020-02-14 DIAGNOSIS — I130 Hypertensive heart and chronic kidney disease with heart failure and stage 1 through stage 4 chronic kidney disease, or unspecified chronic kidney disease: Secondary | ICD-10-CM | POA: Diagnosis not present

## 2020-02-14 DIAGNOSIS — Z95810 Presence of automatic (implantable) cardiac defibrillator: Secondary | ICD-10-CM | POA: Insufficient documentation

## 2020-02-14 DIAGNOSIS — I5032 Chronic diastolic (congestive) heart failure: Secondary | ICD-10-CM | POA: Diagnosis not present

## 2020-02-14 DIAGNOSIS — I10 Essential (primary) hypertension: Secondary | ICD-10-CM

## 2020-02-14 DIAGNOSIS — Z79899 Other long term (current) drug therapy: Secondary | ICD-10-CM | POA: Insufficient documentation

## 2020-02-14 DIAGNOSIS — N1832 Chronic kidney disease, stage 3b: Secondary | ICD-10-CM | POA: Diagnosis not present

## 2020-02-14 DIAGNOSIS — E039 Hypothyroidism, unspecified: Secondary | ICD-10-CM | POA: Insufficient documentation

## 2020-02-14 DIAGNOSIS — Z79890 Hormone replacement therapy (postmenopausal): Secondary | ICD-10-CM | POA: Diagnosis not present

## 2020-02-14 DIAGNOSIS — I2699 Other pulmonary embolism without acute cor pulmonale: Secondary | ICD-10-CM

## 2020-02-14 NOTE — Patient Instructions (Signed)
Your physician has requested that you have an echocardiogram. Echocardiography is a painless test that uses sound waves to create images of your heart. It provides your doctor with information about the size and shape of your heart and how well your heart's chambers and valves are working. This procedure takes approximately one hour. There are no restrictions for this procedure.   Call our office in December 2022 to make an appointment for follow up with echocardiogram

## 2020-02-14 NOTE — Addendum Note (Signed)
Encounter addended by: Stanford Scotland, RN on: 02/14/2020 2:17 PM  Actions taken: Visit diagnoses modified, Order list changed, Diagnosis association updated, Clinical Note Signed

## 2020-02-14 NOTE — Progress Notes (Signed)
Patient ID: Alexandra [name], female   DOB: 11/14/1927, 87 y.o.   MRN: [id number]     Advanced Heart Failure Clinic Note   Primary Care: Dr. Melinda Crutch Primary Cardiologist: Dr Candee Furbish Primary HF: Dr Haroldine Laws  HPI: Alexandra [name] is a 87 y.o. female  with chronic systolic CHF Echo 6/43/32 EF 15% with diffuse hypokinesis initially thought to be tak[place]ubos CMP, HX of PE/DVT 08/2014 on Xarelto, CKD stage 3, HTN, hx of LBBB, and hypothyroidism   Previously in 2013 she had left bundle branch block, EF was low normal. Discharge weight was 196.  She was admitted in July 2016 for Abdominal pain and SOB. Her Gallbladder was thought to be causing her pain, so a lap choley was performed that admission. Echo on 08/15/14 showed EF 20%. There were thoughts that she may have stress induced CMP due to the death of her husband in May 09, 2022 after several months of hospice care.   She was directly admitted to [facility] on 10/11/14 with concerns for low output with SBPs in 80s and tachycardia.  PICC line was placed with initial co-ox of 61%.CVP was 15. Milrinone started when co-ox dropped to 53%. She had SVT on milrinone 0.25 so started on amio. Developed acute SOB that improved quickly with cessation of amio and extra dose of IV lasix. Felt to possibly have acute amio toxicity. Milrinone decreased to 0.125 and no further PSVT. Sent home on milrinone 0.125. Diuresed well on 80 mg lasix IB BID. Discharge weight was 170.  Admitted 12/1 through 12/6 with PICC line infection. Bcx with [facility] 2/2 with for diptheroids. Bcx 1/2 at [facility]-Bacteremia-cornybacterium . PICC line replaced. She continued on milrinone 0.125 mcg.   S/p CRT-D placement 01/16/15 with Dr Caryl Comes.   Echo 2/17 EF ~40% Milrinone stopped 02/2015. Has had no major issues since.   Echo 7/20 EF 55%  She presents for follow up. Continues with chronic fatigue. Does ADLs without problem. No edema, orthopnea or PND. Taking lasix every other day. Followed by Sharman Cheek - takes extra lasix as directed.    ICD interrogated personally: Fluid up and down. Looks good currently. 95% BIV pacing No VT Personally reviewed    RHC 7/17 RA = 1 RV = 25/2 PA = 29/9 (15) PCW = 6 Fick cardiac output/index = 5.9/3.3 PVR = 1.5 WU Ao sat = 98% PA sat = 68%, 70%, 71%  ECHO 10/02/14 LVEF 15% with diffuse hypokinesis, RV mildly dilated, normal function, PA peak pressure 59 mmHg Echo 2/17 EF 40-45% (I fetlt 35%)   Review of systems complete and found to be negative unless listed in HPI.   Past Medical History:  Diagnosis Date  . Adult hypothyroidism 04/02/2014  . Anemia of chronic disease 08/13/2014  . Anxiety   . Arthritis   . Asthma   . breast ca dx'd 2000   left  . CAP (community acquired pneumonia) 12/19/2012  . Chest pain    NM stress test 05/2011 Normal  . CHF (congestive heart failure) ([place]) 08/19/2014  . CKD (chronic kidney disease) stage 3, GFR 30-59 ml/min 08/13/2014  . Depression   . DOE (dyspnea on exertion)    No SOB at rest  . Essential (primary) hypertension 04/02/2014  . Fatigue    excertional  . RJJOACZY(606.3)   . Hereditary and idiopathic peripheral neuropathy 07/02/2014  . Hypercholesteremia   . Intermittent LBBB (left bundle branch block) 12/21/2012  . Leg weakness   . Neuropathy   .  Obesity   . Osteopenia   . Peripheral neuropathy   . Presence of permanent cardiac pacemaker 01/16/2015   BIV  . Sacroiliitis, not elsewhere classified ([place])   . Situational depression   . Trigger finger    [name]    Current Outpatient Medications  Medication Sig Dispense Refill  . acetaminophen (TYLENOL) 500 MG tablet Take 500 mg by mouth every 6 (six) hours as needed for mild pain or headache. Reported on 06/17/2015    . albuterol (PROVENTIL HFA;VENTOLIN HFA) 108 (90 BASE) MCG/ACT inhaler Inhale 2 puffs into the lungs every 6 (six) hours as needed for wheezing or shortness of breath. Reported on 05/27/2015    . ALPRAZolam (XANAX) 0.25 MG  tablet Take 0.25 mg by mouth 3 (three) times daily as needed for anxiety. Reported on 05/27/2015    . amoxicillin (AMOXIL) 500 MG capsule Take 4 capsules by mouth as needed 1 hour before dental work 4 capsule prn  . apixaban (ELIQUIS) 2.5 MG TABS tablet Take 1 tablet (2.5 mg total) by mouth 2 (two) times daily. 60 tablet 0  . b complex vitamins tablet Take 1 tablet by mouth daily with lunch.     . calcium carbonate (TUMS - DOSED IN MG ELEMENTAL CALCIUM) 500 MG chewable tablet Chew 2 tablets by mouth daily as needed for indigestion or heartburn. Reported on 07/18/2015    . cholecalciferol (VITAMIN D) 1000 UNITS tablet Take 1,000 Units by mouth daily with lunch.     . citalopram (CELEXA) 20 MG tablet TAKE 1 TABLET(20 MG) BY MOUTH DAILY 30 tablet 0  . famotidine (PEPCID) 20 MG tablet Take 20 mg by mouth daily.    . ferrous sulfate 325 (65 FE) MG tablet Take 325 mg by mouth daily with breakfast.    . furosemide (LASIX) 40 MG tablet Take one tablet (40 mg) by mouth once every other day. May take extra as needed. 30 tablet 3  . gabapentin (NEURONTIN) 100 MG capsule 1 PO q HS, may increase to 1 PO TID if needed 90 capsule 3  . hydrALAZINE (APRESOLINE) 25 MG tablet Take 1 tablet (25 mg total) by mouth 2 (two) times daily. 270 tablet 0  . lactose free nutrition (BOOST) LIQD Take 237 mLs by mouth daily with breakfast.     . levothyroxine (SYNTHROID, LEVOTHROID) 25 MCG tablet Take 25 mcg by mouth daily before breakfast. for thyroid    . MAGNESIUM-OXIDE 400 (241.3 Mg) MG tablet TAKE 1/2 TABLET(200 MG) BY MOUTH DAILY 15 tablet 11  . Multiple Vitamin (MULITIVITAMIN WITH MINERALS) TABS Take 1 tablet by mouth daily with lunch.     . nitroGLYCERIN (NITROSTAT) 0.4 MG SL tablet Place 1 tablet (0.4 mg total) under the tongue every 5 (five) minutes as needed for chest pain. 25 tablet 4  . Polyethyl Glycol-Propyl Glycol (SYSTANE OP) Place 1 drop into both eyes 2 (two) times daily.    . potassium chloride (K-DUR) 10 MEQ  tablet TAKE 1 TABLET BY MOUTH DAILY 90 tablet 2  . spironolactone (ALDACTONE) 25 MG tablet Take 25 mg by mouth daily.    . traMADol (ULTRAM) 50 MG tablet      No current facility-administered medications for this encounter.    Allergies  Allergen Reactions  . Amiodarone Shortness Of Breath and Nausea And Vomiting  . Cymbalta [Duloxetine Hcl] Other (See Comments)    Depression  . Lyrica [Pregabalin] Other (See Comments)    Blurry vision      Social History  Socioeconomic History  . Marital status: Married    Spouse name: Not on file  . Number of children: 5  . Years of education: HS  . Highest education level: Not on file  Occupational History  . Occupation: Retired  Tobacco Use  . Smoking status: Never Smoker  . Smokeless tobacco: Never Used  Vaping Use  . Vaping Use: Never used  Substance and Sexual Activity  . Alcohol use: No    Alcohol/week: 0.0 standard drinks  . Drug use: No  . Sexual activity: Never  Other Topics Concern  . Not on file  Social History Narrative   Lives at home alone.   Right-handed.   Two cups caffeine daily (coffee).   Social Determinants of Health   Financial Resource Strain: Not on file  Food Insecurity: Not on file  Transportation Needs: Not on file  Physical Activity: Not on file  Stress: Not on file  Social Connections: Not on file  Intimate Partner Violence: Not on file      Family History  Problem Relation Age of Onset  . Pneumonia Mother 72       cause of death  . Diabetes Brother   . Pancreatic cancer Father 66  . Alzheimer's disease Sister   . CVA Daughter        01/21/09  . Stroke Daughter   . Hypertension Daughter   . Heart attack Neg Hx     Vitals:   02/14/20 1351  BP: (!) 160/80  Pulse: 99  SpO2: 99%  Weight: 74.5 kg (164 lb 3.2 oz)   Wt Readings from Last 3 Encounters:  02/14/20 74.5 kg (164 lb 3.2 oz)  02/13/19 78.5 kg (173 lb)  08/04/18 77.1 kg (170 lb)    PHYSICAL EXAM: General:  Well  appearing. No resp difficulty HEENT: normal Neck: supple. no JVD. Carotids 2+ bilat; no bruits. No lymphadenopathy or thryomegaly appreciated. Cor: PMI nondisplaced. Regular rate & rhythm. No rubs, gallops or murmurs. Lungs: clear Abdomen: soft, nontender, nondistended. No hepatosplenomegaly. No bruits or masses. Good bowel sounds. Extremities: no cyanosis, clubbing, rash, edema Neuro: alert & orientedx3, cranial nerves grossly intact. moves all 4 extremities w/o difficulty. Affect pleasant  ASSESSMENT & PLAN:  1. Chronic Systolic Heart failure - Echo 10/02/14 EF 15% Echo 2/17 read as 40-45% reviewed probably closer to 35%. Off milrinone since 2017. s/p St Jude CRT-D placement. 01/16/15 - Echo 11/2016 LVEF 45-50%, Grade 1 DD, Moderate AI - Echo 7/20 EF 55% (completely recovered with CRT) - Stable NYHA II-III symptoms - Volume status looks good on exam and Corevue. Continue lasix 40 qod. Takes extra as needed. Follows with Sharman Cheek. Will continue.  - Continue hydralazine 25 mg BID - Continue spiro 12.5 mg daily - Previously failed b-blocker. - No ACE due to CKD.  - Reinforced fluid restriction to < 2 L daily, sodium restriction to less than 2000 mg daily, and the importance of daily weights.   - Encouraged her to be more active and walk driveway several times per day - F/u 1 year with echo  2. H/o PE/ DVT bilateral by [place] 08/20/2014 - Continue apixaban 2.5 bid for long-term prevention of DVT per Amplify EXT trial. No bleeding 3. CKD Stage 3b - Last creatinine 1.6 in 2/20.  - Had labs with Dr. Hollie Salk yesterday. Renal function stable. 4. Hx of LBBB - s/p CRT-D. Sees Dr. Caryl Comes 95% BiV pacing 5. Hypertension  - BP high here but well controlled here 6. Intolerance  of amiodarone due to possible acute lung toxicity - No change to current plan. Off amio.     Glori Bickers, MD  1:47 PM

## 2020-02-25 ENCOUNTER — Other Ambulatory Visit (HOSPITAL_COMMUNITY): Payer: Self-pay | Admitting: Internal Medicine

## 2020-02-26 ENCOUNTER — Ambulatory Visit (INDEPENDENT_AMBULATORY_CARE_PROVIDER_SITE_OTHER): Payer: Medicare HMO

## 2020-02-26 DIAGNOSIS — Z950 Presence of cardiac pacemaker: Secondary | ICD-10-CM

## 2020-02-26 DIAGNOSIS — I5032 Chronic diastolic (congestive) heart failure: Secondary | ICD-10-CM

## 2020-03-05 NOTE — Progress Notes (Signed)
EPIC Encounter for ICM Monitoring  Patient Name: Alexandra [name] is a 87 y.o. female Date: 03/05/2020 Primary Care Physican: Lawerance Cruel, MD Primary Cardiologist:Skains/Bensimhon Electrophysiologist: Vergie Living Pacing: 96% 1/7/2022Weight: 165lbs   Transmission reviewed.  CorvueThoracic impedancesuggesting normal fluid levels.  Prescribed:  Furosemide 40 mgTake one tablet (40 mg) by mouth once every other day. May take extra as needed.  Potassium 10 mEq 1 tablet daily.  LABS: 08/04/2018 Creatinine 1.79, BUN 34, Potassium 4.0, Sodium 137, GFR 25-29 03/10/2018 Creatinine 1.61, BUN 27, Potassium 4.3, Sodium 138, GFR 28-33 01/27/2018 Creatinine 1.55, BUN 27, Potassium 4.8, Sodium 141, GFR 30-34  Recommendations: No changes.  Follow-up plan: ICM clinic phone appointment on3/14/2022.91 day device clinic remote transmission2/28/2022.  EP/Cardiology Office Visits:03/19/2020 with Dr Caryl Comes.   Copy of ICM check sent to Dr.Klein  Direct Trend view through 03/01/2020    3 month ICM trend: 02/26/2020.    1 Year ICM trend:       [name], RN 03/05/2020 10:14 AM

## 2020-03-17 ENCOUNTER — Ambulatory Visit (INDEPENDENT_AMBULATORY_CARE_PROVIDER_SITE_OTHER): Payer: Medicare HMO

## 2020-03-17 DIAGNOSIS — I5022 Chronic systolic (congestive) heart failure: Secondary | ICD-10-CM

## 2020-03-17 DIAGNOSIS — I428 Other cardiomyopathies: Secondary | ICD-10-CM

## 2020-03-18 DIAGNOSIS — I428 Other cardiomyopathies: Secondary | ICD-10-CM | POA: Insufficient documentation

## 2020-03-18 DIAGNOSIS — Z95810 Presence of automatic (implantable) cardiac defibrillator: Secondary | ICD-10-CM | POA: Insufficient documentation

## 2020-03-18 DIAGNOSIS — I471 Supraventricular tachycardia: Secondary | ICD-10-CM | POA: Insufficient documentation

## 2020-03-19 ENCOUNTER — Encounter: Payer: Self-pay | Admitting: Internal Medicine

## 2020-03-19 ENCOUNTER — Ambulatory Visit: Payer: Medicare HMO | Admitting: Internal Medicine

## 2020-03-19 ENCOUNTER — Other Ambulatory Visit: Payer: Self-pay

## 2020-03-19 VITALS — BP 126/64 | HR 93 | Ht 64.5 in | Wt 166.0 lb

## 2020-03-19 DIAGNOSIS — Z95810 Presence of automatic (implantable) cardiac defibrillator: Secondary | ICD-10-CM

## 2020-03-19 DIAGNOSIS — I428 Other cardiomyopathies: Secondary | ICD-10-CM

## 2020-03-19 DIAGNOSIS — I5022 Chronic systolic (congestive) heart failure: Secondary | ICD-10-CM | POA: Diagnosis not present

## 2020-03-19 DIAGNOSIS — I471 Supraventricular tachycardia: Secondary | ICD-10-CM

## 2020-03-19 LAB — CUP PACEART REMOTE DEVICE CHECK
Battery Remaining Longevity: 38 mo
Battery Remaining Percentage: 44 %
Battery Voltage: 2.86 V
Brady Statistic AP VP Percent: 1 %
Brady Statistic AP VS Percent: 1 %
Brady Statistic AS VP Percent: 95 %
Brady Statistic AS VS Percent: 4.2 %
Brady Statistic RA Percent Paced: 1 %
Date Time Interrogation Session: 20220228020015
Implantable Lead Implant Date: 20161229
Implantable Lead Implant Date: 20161229
Implantable Lead Implant Date: 20161229
Implantable Lead Location: 753858
Implantable Lead Location: 753859
Implantable Lead Location: 753860
Implantable Pulse Generator Implant Date: 20161229
Lead Channel Impedance Value: 1075 Ohm
Lead Channel Impedance Value: 410 Ohm
Lead Channel Impedance Value: 440 Ohm
Lead Channel Pacing Threshold Amplitude: 0.75 V
Lead Channel Pacing Threshold Amplitude: 0.75 V
Lead Channel Pacing Threshold Amplitude: 0.75 V
Lead Channel Pacing Threshold Pulse Width: 0.5 ms
Lead Channel Pacing Threshold Pulse Width: 0.5 ms
Lead Channel Pacing Threshold Pulse Width: 1.5 ms
Lead Channel Sensing Intrinsic Amplitude: 1.4 mV
Lead Channel Sensing Intrinsic Amplitude: 12 mV
Lead Channel Setting Pacing Amplitude: 1.75 V
Lead Channel Setting Pacing Amplitude: 2 V
Lead Channel Setting Pacing Amplitude: 2 V
Lead Channel Setting Pacing Pulse Width: 0.5 ms
Lead Channel Setting Pacing Pulse Width: 1.5 ms
Lead Channel Setting Sensing Sensitivity: 2 mV
Pulse Gen Model: 3262
Pulse Gen Serial Number: 7834528

## 2020-03-19 NOTE — Progress Notes (Signed)
Patient Care Team: Lawerance Cruel, MD as PCP - General (Family Medicine) Deboraha Sprang, MD as Consulting Physician (Cardiology)   HPI  Alexandra [name] is a 87 y.o. female Seen following  CRT-p  Implant 2016  She has a history of nonischemic cardiomyopathy and left bundle branch block. It was initially thought to be related to [place] when she presented 7/16 EF at that time was 20%. She was admitted 9/16 with low output and was treated with milrinone,  down titration of which was required because of tachycardia. 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  She required outpatient milrinone area and this was ultimately stopped 2/17. She underwent right heart catheterization 7/17 with outstanding numbers. Low filling pressures.   She originally did not respond significantly to  CRT. We reprogrammed her AV delays and underwent AV optimization by echo   Her major complaint is fatigue.  This is been addressed by CHF clinic.  Volume status has been stable.  Without edema or orthopnea.  No real complaints of shortness of breath but she is also limited in her gait because of her knees.  No chest pain.  Denies sleep disordered breathing.  Has been on citalopram for some time.  Marland Kitchen   DATE TEST EF   9/16 Echo    15 %   2/17 Echo  40-45 %   11/18 Echo  45-50%   7/20 Echo  55%       Date Cr K TSH Hgb  1/18 1.5 3.8  12.2  11/18 1.46 4.4    5/19 1.41 4.8      7/20 1..79 4.0  13.4  12/20 1.6>>1.5 4.0    5/21 1.69 4.1 1.25 13.0    Programming changes Tip-coil>> 1-2 2/2 pectoral stimulation   Blood pressure has been reasonably controlled.  Past Medical History:  Diagnosis Date  . Adult hypothyroidism 04/02/2014  . Anemia of chronic disease 08/13/2014  . Anxiety   . Arthritis   . Asthma   . breast ca dx'd 2000   left  . CAP (community acquired pneumonia) 12/19/2012  . Chest  pain    NM stress test 05/2011 Normal  . CHF (congestive heart failure) ([place]) 08/19/2014  . CKD (chronic kidney disease) stage 3, GFR 30-59 ml/min ([facility]) 08/13/2014  . Depression   . DOE (dyspnea on exertion)    No SOB at rest  . Essential (primary) hypertension 04/02/2014  . Fatigue    excertional  . WVPXTGGY(694.8)   . Hereditary and idiopathic peripheral neuropathy 07/02/2014  . Hypercholesteremia   . Intermittent LBBB (left bundle branch block) 12/21/2012  . Leg weakness   . Neuropathy   . Obesity   . Osteopenia   . Peripheral neuropathy   . Presence of permanent cardiac pacemaker 01/16/2015   BIV  . Sacroiliitis, not elsewhere classified ([name])   . Situational depression   . Trigger finger    [name]    Past Surgical History:  Procedure Laterality Date  . ACHILLES TENDON SURGERY    . BREAST LUMPECTOMY     breast cancer  . CARDIAC CATHETERIZATION N/A 07/21/2015   Procedure: Right Heart Cath;  Surgeon: Jolaine Artist, MD;  Location: [facility];  Service: Cardiovascular;  Laterality: N/A;  . CHOLECYSTECTOMY N/A 08/13/2014   Procedure: LAPAROSCOPIC CHOLECYSTECTOMY;  Surgeon: Stark Christoffer Currier, MD;  Location: [facility];  Service: General;  Laterality:  N/A;  . EP IMPLANTABLE DEVICE N/A 01/16/2015   Procedure: BiV Pacemaker Insertion CRT-P;  Surgeon: Deboraha Sprang, MD;  Location: [facility];  Service: Cardiovascular;  Laterality: N/A;  . LEG SURGERY    . LUMBAR LAMINECTOMY    . POLYPECTOMY    . TONSILLECTOMY    . VESICOVAGINAL FISTULA CLOSURE W/ TAH     DC hysterectomy    Current Outpatient Medications  Medication Sig Dispense Refill  . acetaminophen (TYLENOL) 500 MG tablet Take 500 mg by mouth every 6 (six) hours as needed for mild pain or headache. Reported on 06/17/2015    . albuterol (PROVENTIL HFA;VENTOLIN HFA) 108 (90 BASE) MCG/ACT inhaler Inhale 2 puffs into the lungs every 6 (six) hours as needed for wheezing or shortness of breath. Reported on 05/27/2015     . ALPRAZolam (XANAX) 0.25 MG tablet Take 0.25 mg by mouth 3 (three) times daily as needed for anxiety. Reported on 05/27/2015    . amoxicillin (AMOXIL) 500 MG capsule Take 4 capsules by mouth as needed 1 hour before dental work 4 capsule prn  . b complex vitamins tablet Take 1 tablet by mouth daily with lunch.     . calcium carbonate (TUMS - DOSED IN MG ELEMENTAL CALCIUM) 500 MG chewable tablet Chew 2 tablets by mouth daily as needed for indigestion or heartburn. Reported on 07/18/2015    . cholecalciferol (VITAMIN D) 1000 UNITS tablet Take 1,000 Units by mouth daily with lunch.     . citalopram (CELEXA) 20 MG tablet TAKE 1 TABLET(20 MG) BY MOUTH DAILY 30 tablet 0  . ELIQUIS 2.5 MG TABS tablet TAKE 1 TABLET(2.5 MG) BY MOUTH TWICE DAILY 60 tablet 0  . famotidine (PEPCID) 20 MG tablet Take 20 mg by mouth daily.    . ferrous sulfate 325 (65 FE) MG tablet Take 325 mg by mouth daily with breakfast.    . furosemide (LASIX) 40 MG tablet Take one tablet (40 mg) by mouth once every other day. May take extra as needed. 30 tablet 3  . hydrALAZINE (APRESOLINE) 25 MG tablet Take 1 tablet (25 mg total) by mouth 2 (two) times daily. 270 tablet 0  . lactose free nutrition (BOOST) LIQD Take 237 mLs by mouth daily with breakfast.     . levothyroxine (SYNTHROID, LEVOTHROID) 25 MCG tablet Take 25 mcg by mouth daily before breakfast. for thyroid    . MAGNESIUM-OXIDE 400 (241.3 Mg) MG tablet TAKE 1/2 TABLET(200 MG) BY MOUTH DAILY 15 tablet 11  . Multiple Vitamin (MULITIVITAMIN WITH MINERALS) TABS Take 1 tablet by mouth daily with lunch.     . nitroGLYCERIN (NITROSTAT) 0.4 MG SL tablet Place 1 tablet (0.4 mg total) under the tongue every 5 (five) minutes as needed for chest pain. 25 tablet 4  . Polyethyl Glycol-Propyl Glycol (SYSTANE OP) Place 1 drop into both eyes 2 (two) times daily.    . potassium chloride (K-DUR) 10 MEQ tablet TAKE 1 TABLET BY MOUTH DAILY 90 tablet 2  . spironolactone (ALDACTONE) 25 MG tablet Take 25  mg by mouth daily.     No current facility-administered medications for this visit.    Allergies  Allergen Reactions  . Amiodarone Shortness Of Breath and Nausea And Vomiting  . Cymbalta [Duloxetine Hcl] Other (See Comments)    Depression  . Lyrica [Pregabalin] Other (See Comments)    Blurry vision      Review of Systems negative except from HPI and PMH  Physical Exam  BP 126/64  Pulse 93   Ht 5' 4.5" (1.638 m)   Wt 166 lb (75.3 kg)   SpO2 95%   BMI 28.05 kg/m  Well developed and well nourished in no acute distress HENT normal Neck supple with JVP-flat Clear Device pocket well healed; without hematoma or erythema.  There is no tethering  Regular rate and rhythm, no murmur Abd-soft with active BS No Clubbing cyanosis   edema Skin-warm and dry A & Oriented  Grossly normal sensory and motor function  ECG sinus at 93 with P synchronous pacing Interval 15/13/42 QRS is QR in lead I and RS in lead V1 Intrinsic AV delay is about 175 ms programmed AV delays are 150/110  Assessment and  Plan Nonischemic cardiomyopathy  Congestive heart failure-chronic-diastolic  ICD-CRT-D-St. Jude's  The patient's device was interrogated and the information was fully reviewed.  The device was reprogrammed to pace between the by pulse 1-2 as opposed to using the RV ring as there was pectoral   Renal dysfunction gd 3-4 GFR 26 (creatinine 1.69 on 5/24  Atrial Tachycardia  High Risk Medication Surveillance Aldactone    Interval normalization  (near) of LV function.  She is euvolemic.  Her device function is normal.  I do not think that there is a role for further optimization given the interval significant improvement in LV function and her electrocardiographic parameters for resynchronization.  Her fatigue may be related to her medications, I think citalopram is probably the likely culprit.  I suggested she speak to her primary care physician the possibility of down titration.  No  interval atrial tachycardia to speak of   Renal function remains borderline for spironolactone.  Will reach out to heart failure clinic to see whether they would like to reassess and possibly stop

## 2020-03-19 NOTE — Patient Instructions (Signed)

## 2020-03-24 NOTE — Progress Notes (Signed)
Remote pacemaker transmission.   

## 2020-03-26 ENCOUNTER — Other Ambulatory Visit (HOSPITAL_COMMUNITY): Payer: Self-pay | Admitting: Internal Medicine

## 2020-03-31 ENCOUNTER — Ambulatory Visit (INDEPENDENT_AMBULATORY_CARE_PROVIDER_SITE_OTHER): Payer: Medicare HMO

## 2020-03-31 DIAGNOSIS — Z95810 Presence of automatic (implantable) cardiac defibrillator: Secondary | ICD-10-CM | POA: Diagnosis not present

## 2020-03-31 DIAGNOSIS — I5022 Chronic systolic (congestive) heart failure: Secondary | ICD-10-CM

## 2020-04-01 NOTE — Progress Notes (Signed)
EPIC Encounter for ICM Monitoring  Patient Name: Alexandra [name] is a 87 y.o. female Date: 04/01/2020 Primary Care Physican: Lawerance Cruel, MD Primary Cardiologist:Skains/Bensimhon Electrophysiologist: Vergie Living Pacing: 97% 3/2/2022Weight:165lbs   Spoke with patient and reports feeling well at this time.  Denies fluid symptoms.    CorvueThoracic impedancesuggesting normal fluid levels.  Prescribed:  Furosemide 40 mgTake one tablet (40 mg) by mouth once every other day. May take extra as needed.  Potassium 10 mEq 1 tablet daily.  LABS: 08/04/2018 Creatinine 1.79, BUN 34, Potassium 4.0, Sodium 137, GFR 25-29 03/10/2018 Creatinine 1.61, BUN 27, Potassium 4.3, Sodium 138, GFR 28-33 01/27/2018 Creatinine 1.55, BUN 27, Potassium 4.8, Sodium 141, GFR 30-34  Recommendations:No changes and encouraged to call if experiencing any fluid symptoms.  Follow-up plan: ICM clinic phone appointment on4/18/2022.91 day device clinic remote transmission 06/17/2020.  EP/Cardiology Office Visits: Recall 03/19/2021 with Dr Caryl Comes. Recall 02/14/2021 with Dr Haroldine Laws  Copy of ICM check sent to [place]  3 month ICM trend: 03/31/2020.    1 Year ICM trend:       [name], RN 04/01/2020 11:01 AM

## 2020-04-22 ENCOUNTER — Telehealth (HOSPITAL_COMMUNITY): Payer: Self-pay | Admitting: *Deleted

## 2020-04-22 NOTE — Telephone Encounter (Signed)
Pt left VM asking if it is ok for her to use CBD oil. Pt said she wants to use it for back pain but wants to make sure it wont interact with her prescription medications.  Routed to [place] for advice

## 2020-04-22 NOTE — Telephone Encounter (Signed)
That fine. Start with low dose.

## 2020-04-22 NOTE — Telephone Encounter (Signed)
Pt aware.

## 2020-05-05 ENCOUNTER — Ambulatory Visit (INDEPENDENT_AMBULATORY_CARE_PROVIDER_SITE_OTHER): Payer: Medicare HMO

## 2020-05-05 DIAGNOSIS — Z95810 Presence of automatic (implantable) cardiac defibrillator: Secondary | ICD-10-CM

## 2020-05-05 DIAGNOSIS — I5022 Chronic systolic (congestive) heart failure: Secondary | ICD-10-CM

## 2020-05-09 ENCOUNTER — Telehealth: Payer: Self-pay

## 2020-05-09 NOTE — Progress Notes (Signed)
EPIC Encounter for ICM Monitoring  Patient Name: Alexandra [name] is a 87 y.o. female Date: 05/09/2020 Primary Care Physican: Lawerance Cruel, MD Primary Cardiologist:Skains/Bensimhon Electrophysiologist: Vergie Living Pacing: 97% 3/2/2022Weight:165lbs   Attempted call to patient and unable to reach.  Left detailed message per DPR regarding transmission. Transmission reviewed.   CorvueThoracic impedancesuggesting normal fluid levels.  Prescribed:  Furosemide 40 mgTake one tablet (40 mg) by mouth once every other day. May take extra as needed.  Potassium 10 mEq 1 tablet daily.  LABS: 08/04/2018 Creatinine 1.79, BUN 34, Potassium 4.0, Sodium 137, GFR 25-29 03/10/2018 Creatinine 1.61, BUN 27, Potassium 4.3, Sodium 138, GFR 28-33 01/27/2018 Creatinine 1.55, BUN 27, Potassium 4.8, Sodium 141, GFR 30-34  Recommendations: Left voice mail with ICM number and encouraged to call if experiencing any fluid symptoms.  Follow-up plan: ICM clinic phone appointment on6/01/2020.91 day device clinic remote transmission 06/17/2020.  EP/Cardiology Office Visits: Recall 03/19/2021 with Dr Caryl Comes. Recall 02/14/2021 with Dr Haroldine Laws  Copy of ICM check sent to [place]  3 month ICM trend: 05/05/2020.    1 Year ICM trend:       [name], RN 05/09/2020 10:55 AM

## 2020-05-09 NOTE — Telephone Encounter (Signed)
Remote ICM transmission received.  Attempted call to patient regarding ICM remote transmission and left detailed message per DPR.  Advised to return call for any fluid symptoms or questions. Next ICM remote transmission scheduled 06/18/2020.     

## 2020-05-28 DIAGNOSIS — M2569 Stiffness of other specified joint, not elsewhere classified: Secondary | ICD-10-CM | POA: Diagnosis not present

## 2020-05-28 DIAGNOSIS — R269 Unspecified abnormalities of gait and mobility: Secondary | ICD-10-CM | POA: Diagnosis not present

## 2020-05-28 DIAGNOSIS — R6889 Other general symptoms and signs: Secondary | ICD-10-CM | POA: Diagnosis not present

## 2020-05-28 DIAGNOSIS — M6281 Muscle weakness (generalized): Secondary | ICD-10-CM | POA: Diagnosis not present

## 2020-05-28 DIAGNOSIS — R5383 Other fatigue: Secondary | ICD-10-CM | POA: Diagnosis not present

## 2020-05-28 DIAGNOSIS — R293 Abnormal posture: Secondary | ICD-10-CM | POA: Diagnosis not present

## 2020-06-02 DIAGNOSIS — R6889 Other general symptoms and signs: Secondary | ICD-10-CM | POA: Diagnosis not present

## 2020-06-02 DIAGNOSIS — R293 Abnormal posture: Secondary | ICD-10-CM | POA: Diagnosis not present

## 2020-06-02 DIAGNOSIS — R269 Unspecified abnormalities of gait and mobility: Secondary | ICD-10-CM | POA: Diagnosis not present

## 2020-06-02 DIAGNOSIS — M6281 Muscle weakness (generalized): Secondary | ICD-10-CM | POA: Diagnosis not present

## 2020-06-02 DIAGNOSIS — R5383 Other fatigue: Secondary | ICD-10-CM | POA: Diagnosis not present

## 2020-06-02 DIAGNOSIS — M2569 Stiffness of other specified joint, not elsewhere classified: Secondary | ICD-10-CM | POA: Diagnosis not present

## 2020-06-04 DIAGNOSIS — K219 Gastro-esophageal reflux disease without esophagitis: Secondary | ICD-10-CM | POA: Diagnosis not present

## 2020-06-04 DIAGNOSIS — J4520 Mild intermittent asthma, uncomplicated: Secondary | ICD-10-CM | POA: Diagnosis not present

## 2020-06-04 DIAGNOSIS — I10 Essential (primary) hypertension: Secondary | ICD-10-CM | POA: Diagnosis not present

## 2020-06-04 DIAGNOSIS — E039 Hypothyroidism, unspecified: Secondary | ICD-10-CM | POA: Diagnosis not present

## 2020-06-04 DIAGNOSIS — Z853 Personal history of malignant neoplasm of breast: Secondary | ICD-10-CM | POA: Diagnosis not present

## 2020-06-04 DIAGNOSIS — D649 Anemia, unspecified: Secondary | ICD-10-CM | POA: Diagnosis not present

## 2020-06-04 DIAGNOSIS — M8580 Other specified disorders of bone density and structure, unspecified site: Secondary | ICD-10-CM | POA: Diagnosis not present

## 2020-06-04 DIAGNOSIS — N184 Chronic kidney disease, stage 4 (severe): Secondary | ICD-10-CM | POA: Diagnosis not present

## 2020-06-04 DIAGNOSIS — I509 Heart failure, unspecified: Secondary | ICD-10-CM | POA: Diagnosis not present

## 2020-06-05 DIAGNOSIS — R5383 Other fatigue: Secondary | ICD-10-CM | POA: Diagnosis not present

## 2020-06-05 DIAGNOSIS — M2569 Stiffness of other specified joint, not elsewhere classified: Secondary | ICD-10-CM | POA: Diagnosis not present

## 2020-06-05 DIAGNOSIS — M6281 Muscle weakness (generalized): Secondary | ICD-10-CM | POA: Diagnosis not present

## 2020-06-05 DIAGNOSIS — R6889 Other general symptoms and signs: Secondary | ICD-10-CM | POA: Diagnosis not present

## 2020-06-05 DIAGNOSIS — R293 Abnormal posture: Secondary | ICD-10-CM | POA: Diagnosis not present

## 2020-06-05 DIAGNOSIS — R269 Unspecified abnormalities of gait and mobility: Secondary | ICD-10-CM | POA: Diagnosis not present

## 2020-06-10 DIAGNOSIS — R5383 Other fatigue: Secondary | ICD-10-CM | POA: Diagnosis not present

## 2020-06-10 DIAGNOSIS — R293 Abnormal posture: Secondary | ICD-10-CM | POA: Diagnosis not present

## 2020-06-10 DIAGNOSIS — M6281 Muscle weakness (generalized): Secondary | ICD-10-CM | POA: Diagnosis not present

## 2020-06-10 DIAGNOSIS — M2569 Stiffness of other specified joint, not elsewhere classified: Secondary | ICD-10-CM | POA: Diagnosis not present

## 2020-06-10 DIAGNOSIS — R6889 Other general symptoms and signs: Secondary | ICD-10-CM | POA: Diagnosis not present

## 2020-06-10 DIAGNOSIS — R269 Unspecified abnormalities of gait and mobility: Secondary | ICD-10-CM | POA: Diagnosis not present

## 2020-06-12 DIAGNOSIS — R293 Abnormal posture: Secondary | ICD-10-CM | POA: Diagnosis not present

## 2020-06-12 DIAGNOSIS — R269 Unspecified abnormalities of gait and mobility: Secondary | ICD-10-CM | POA: Diagnosis not present

## 2020-06-12 DIAGNOSIS — M6281 Muscle weakness (generalized): Secondary | ICD-10-CM | POA: Diagnosis not present

## 2020-06-12 DIAGNOSIS — R5383 Other fatigue: Secondary | ICD-10-CM | POA: Diagnosis not present

## 2020-06-12 DIAGNOSIS — M2569 Stiffness of other specified joint, not elsewhere classified: Secondary | ICD-10-CM | POA: Diagnosis not present

## 2020-06-16 LAB — CUP PACEART REMOTE DEVICE CHECK
Battery Remaining Longevity: 32 mo
Battery Remaining Percentage: 38 %
Battery Voltage: 2.84 V
Brady Statistic AP VP Percent: 1 %
Brady Statistic AP VS Percent: 1 %
Brady Statistic AS VP Percent: 97 %
Brady Statistic AS VS Percent: 2.6 %
Brady Statistic RA Percent Paced: 1 %
Date Time Interrogation Session: 20220530025818
Implantable Lead Implant Date: 20161229
Implantable Lead Implant Date: 20161229
Implantable Lead Implant Date: 20161229
Implantable Lead Location: 753858
Implantable Lead Location: 753859
Implantable Lead Location: 753860
Implantable Pulse Generator Implant Date: 20161229
Lead Channel Impedance Value: 410 Ohm
Lead Channel Impedance Value: 430 Ohm
Lead Channel Impedance Value: 990 Ohm
Lead Channel Pacing Threshold Amplitude: 0.625 V
Lead Channel Pacing Threshold Amplitude: 0.75 V
Lead Channel Pacing Threshold Amplitude: 0.75 V
Lead Channel Pacing Threshold Pulse Width: 0.5 ms
Lead Channel Pacing Threshold Pulse Width: 0.5 ms
Lead Channel Pacing Threshold Pulse Width: 1.5 ms
Lead Channel Sensing Intrinsic Amplitude: 12 mV
Lead Channel Sensing Intrinsic Amplitude: 2.4 mV
Lead Channel Setting Pacing Amplitude: 1.625
Lead Channel Setting Pacing Amplitude: 2 V
Lead Channel Setting Pacing Amplitude: 2 V
Lead Channel Setting Pacing Pulse Width: 0.5 ms
Lead Channel Setting Pacing Pulse Width: 1.5 ms
Lead Channel Setting Sensing Sensitivity: 2 mV
Pulse Gen Model: 3262
Pulse Gen Serial Number: 7834528

## 2020-06-17 ENCOUNTER — Ambulatory Visit (INDEPENDENT_AMBULATORY_CARE_PROVIDER_SITE_OTHER): Payer: Medicare HMO

## 2020-06-17 DIAGNOSIS — M2569 Stiffness of other specified joint, not elsewhere classified: Secondary | ICD-10-CM | POA: Diagnosis not present

## 2020-06-17 DIAGNOSIS — I428 Other cardiomyopathies: Secondary | ICD-10-CM | POA: Diagnosis not present

## 2020-06-17 DIAGNOSIS — R5383 Other fatigue: Secondary | ICD-10-CM | POA: Diagnosis not present

## 2020-06-17 DIAGNOSIS — M6281 Muscle weakness (generalized): Secondary | ICD-10-CM | POA: Diagnosis not present

## 2020-06-17 DIAGNOSIS — R269 Unspecified abnormalities of gait and mobility: Secondary | ICD-10-CM | POA: Diagnosis not present

## 2020-06-17 DIAGNOSIS — I5022 Chronic systolic (congestive) heart failure: Secondary | ICD-10-CM

## 2020-06-17 DIAGNOSIS — R6889 Other general symptoms and signs: Secondary | ICD-10-CM | POA: Diagnosis not present

## 2020-06-17 DIAGNOSIS — R293 Abnormal posture: Secondary | ICD-10-CM | POA: Diagnosis not present

## 2020-06-18 ENCOUNTER — Ambulatory Visit (INDEPENDENT_AMBULATORY_CARE_PROVIDER_SITE_OTHER): Payer: Medicare HMO

## 2020-06-18 DIAGNOSIS — I5022 Chronic systolic (congestive) heart failure: Secondary | ICD-10-CM

## 2020-06-18 DIAGNOSIS — Z95810 Presence of automatic (implantable) cardiac defibrillator: Secondary | ICD-10-CM | POA: Diagnosis not present

## 2020-06-19 DIAGNOSIS — R5383 Other fatigue: Secondary | ICD-10-CM | POA: Diagnosis not present

## 2020-06-19 DIAGNOSIS — M2569 Stiffness of other specified joint, not elsewhere classified: Secondary | ICD-10-CM | POA: Diagnosis not present

## 2020-06-19 DIAGNOSIS — M6281 Muscle weakness (generalized): Secondary | ICD-10-CM | POA: Diagnosis not present

## 2020-06-19 DIAGNOSIS — R293 Abnormal posture: Secondary | ICD-10-CM | POA: Diagnosis not present

## 2020-06-19 DIAGNOSIS — R269 Unspecified abnormalities of gait and mobility: Secondary | ICD-10-CM | POA: Diagnosis not present

## 2020-06-20 NOTE — Progress Notes (Signed)
EPIC Encounter for ICM Monitoring  Patient Name: Alexandra [name] is a 87 y.o. female Date: 06/20/2020 Primary Care Physican: Lawerance Cruel, MD Primary Cardiologist:Skains/Bensimhon Electrophysiologist: Vergie Living Pacing: 97% 6/3/2022Weight:165lbs   Spoke with patient and reports feeling well at this time. Heart failure questions reviewed. Pt asymptomatic.  CorvueThoracic impedancesuggesting normal fluid levels.  Prescribed:  Furosemide 40 mgTake one tablet (40 mg) by mouth once every other day. May take extra as needed.  Potassium 10 mEq 1 tablet daily.  LABS: 08/04/2018 Creatinine 1.79, BUN 34, Potassium 4.0, Sodium 137, GFR 25-29 03/10/2018 Creatinine 1.61, BUN 27, Potassium 4.3, Sodium 138, GFR 28-33 01/27/2018 Creatinine 1.55, BUN 27, Potassium 4.8, Sodium 141, GFR 30-34  Recommendations: No changes and encouraged to call if experiencing any fluid symptoms.  Follow-up plan: ICM clinic phone appointment on7/05/2020.91 day device clinic remote transmission8/29/2022.  EP/Cardiology Office Visits: Recall3/2/2023with Dr Caryl Comes. Recall 02/14/2021 with Dr Haroldine Laws  Copy of ICM check sent to [place]  3 month ICM trend: 06/18/2020.    1 Year ICM trend:       [name], RN 06/20/2020 2:27 PM

## 2020-07-01 DIAGNOSIS — N184 Chronic kidney disease, stage 4 (severe): Secondary | ICD-10-CM | POA: Diagnosis not present

## 2020-07-01 DIAGNOSIS — R5383 Other fatigue: Secondary | ICD-10-CM | POA: Diagnosis not present

## 2020-07-01 DIAGNOSIS — M6281 Muscle weakness (generalized): Secondary | ICD-10-CM | POA: Diagnosis not present

## 2020-07-01 DIAGNOSIS — I10 Essential (primary) hypertension: Secondary | ICD-10-CM | POA: Diagnosis not present

## 2020-07-01 DIAGNOSIS — M8580 Other specified disorders of bone density and structure, unspecified site: Secondary | ICD-10-CM | POA: Diagnosis not present

## 2020-07-01 DIAGNOSIS — E039 Hypothyroidism, unspecified: Secondary | ICD-10-CM | POA: Diagnosis not present

## 2020-07-01 DIAGNOSIS — Z853 Personal history of malignant neoplasm of breast: Secondary | ICD-10-CM | POA: Diagnosis not present

## 2020-07-01 DIAGNOSIS — R293 Abnormal posture: Secondary | ICD-10-CM | POA: Diagnosis not present

## 2020-07-01 DIAGNOSIS — I509 Heart failure, unspecified: Secondary | ICD-10-CM | POA: Diagnosis not present

## 2020-07-01 DIAGNOSIS — E7800 Pure hypercholesterolemia, unspecified: Secondary | ICD-10-CM | POA: Diagnosis not present

## 2020-07-01 DIAGNOSIS — R269 Unspecified abnormalities of gait and mobility: Secondary | ICD-10-CM | POA: Diagnosis not present

## 2020-07-01 DIAGNOSIS — J4520 Mild intermittent asthma, uncomplicated: Secondary | ICD-10-CM | POA: Diagnosis not present

## 2020-07-01 DIAGNOSIS — M2569 Stiffness of other specified joint, not elsewhere classified: Secondary | ICD-10-CM | POA: Diagnosis not present

## 2020-07-01 DIAGNOSIS — K219 Gastro-esophageal reflux disease without esophagitis: Secondary | ICD-10-CM | POA: Diagnosis not present

## 2020-07-03 DIAGNOSIS — R269 Unspecified abnormalities of gait and mobility: Secondary | ICD-10-CM | POA: Diagnosis not present

## 2020-07-03 DIAGNOSIS — R5383 Other fatigue: Secondary | ICD-10-CM | POA: Diagnosis not present

## 2020-07-03 DIAGNOSIS — R6889 Other general symptoms and signs: Secondary | ICD-10-CM | POA: Diagnosis not present

## 2020-07-03 DIAGNOSIS — M2569 Stiffness of other specified joint, not elsewhere classified: Secondary | ICD-10-CM | POA: Diagnosis not present

## 2020-07-03 DIAGNOSIS — R293 Abnormal posture: Secondary | ICD-10-CM | POA: Diagnosis not present

## 2020-07-03 DIAGNOSIS — M6281 Muscle weakness (generalized): Secondary | ICD-10-CM | POA: Diagnosis not present

## 2020-07-09 NOTE — Progress Notes (Signed)
Remote pacemaker transmission.   

## 2020-07-10 DIAGNOSIS — R293 Abnormal posture: Secondary | ICD-10-CM | POA: Diagnosis not present

## 2020-07-10 DIAGNOSIS — R5383 Other fatigue: Secondary | ICD-10-CM | POA: Diagnosis not present

## 2020-07-10 DIAGNOSIS — M2569 Stiffness of other specified joint, not elsewhere classified: Secondary | ICD-10-CM | POA: Diagnosis not present

## 2020-07-10 DIAGNOSIS — R269 Unspecified abnormalities of gait and mobility: Secondary | ICD-10-CM | POA: Diagnosis not present

## 2020-07-10 DIAGNOSIS — R6889 Other general symptoms and signs: Secondary | ICD-10-CM | POA: Diagnosis not present

## 2020-07-10 DIAGNOSIS — M6281 Muscle weakness (generalized): Secondary | ICD-10-CM | POA: Diagnosis not present

## 2020-07-15 DIAGNOSIS — M6281 Muscle weakness (generalized): Secondary | ICD-10-CM | POA: Diagnosis not present

## 2020-07-15 DIAGNOSIS — R293 Abnormal posture: Secondary | ICD-10-CM | POA: Diagnosis not present

## 2020-07-15 DIAGNOSIS — M2569 Stiffness of other specified joint, not elsewhere classified: Secondary | ICD-10-CM | POA: Diagnosis not present

## 2020-07-15 DIAGNOSIS — R269 Unspecified abnormalities of gait and mobility: Secondary | ICD-10-CM | POA: Diagnosis not present

## 2020-07-15 DIAGNOSIS — R5383 Other fatigue: Secondary | ICD-10-CM | POA: Diagnosis not present

## 2020-07-17 DIAGNOSIS — R293 Abnormal posture: Secondary | ICD-10-CM | POA: Diagnosis not present

## 2020-07-17 DIAGNOSIS — R269 Unspecified abnormalities of gait and mobility: Secondary | ICD-10-CM | POA: Diagnosis not present

## 2020-07-17 DIAGNOSIS — R5383 Other fatigue: Secondary | ICD-10-CM | POA: Diagnosis not present

## 2020-07-17 DIAGNOSIS — M2569 Stiffness of other specified joint, not elsewhere classified: Secondary | ICD-10-CM | POA: Diagnosis not present

## 2020-07-17 DIAGNOSIS — M6281 Muscle weakness (generalized): Secondary | ICD-10-CM | POA: Diagnosis not present

## 2020-07-22 ENCOUNTER — Ambulatory Visit (INDEPENDENT_AMBULATORY_CARE_PROVIDER_SITE_OTHER): Payer: Medicare HMO

## 2020-07-22 DIAGNOSIS — Z950 Presence of cardiac pacemaker: Secondary | ICD-10-CM

## 2020-07-22 DIAGNOSIS — I5022 Chronic systolic (congestive) heart failure: Secondary | ICD-10-CM | POA: Diagnosis not present

## 2020-07-23 ENCOUNTER — Telehealth: Payer: Self-pay

## 2020-07-23 DIAGNOSIS — I10 Essential (primary) hypertension: Secondary | ICD-10-CM | POA: Diagnosis not present

## 2020-07-23 DIAGNOSIS — D6859 Other primary thrombophilia: Secondary | ICD-10-CM | POA: Diagnosis not present

## 2020-07-23 DIAGNOSIS — E039 Hypothyroidism, unspecified: Secondary | ICD-10-CM | POA: Diagnosis not present

## 2020-07-23 DIAGNOSIS — E7800 Pure hypercholesterolemia, unspecified: Secondary | ICD-10-CM | POA: Diagnosis not present

## 2020-07-23 DIAGNOSIS — M40209 Unspecified kyphosis, site unspecified: Secondary | ICD-10-CM | POA: Diagnosis not present

## 2020-07-23 DIAGNOSIS — E559 Vitamin D deficiency, unspecified: Secondary | ICD-10-CM | POA: Diagnosis not present

## 2020-07-23 DIAGNOSIS — R6889 Other general symptoms and signs: Secondary | ICD-10-CM | POA: Diagnosis not present

## 2020-07-23 DIAGNOSIS — M8580 Other specified disorders of bone density and structure, unspecified site: Secondary | ICD-10-CM | POA: Diagnosis not present

## 2020-07-23 DIAGNOSIS — Z0000 Encounter for general adult medical examination without abnormal findings: Secondary | ICD-10-CM | POA: Diagnosis not present

## 2020-07-23 DIAGNOSIS — N184 Chronic kidney disease, stage 4 (severe): Secondary | ICD-10-CM | POA: Diagnosis not present

## 2020-07-23 NOTE — Telephone Encounter (Signed)
Remote ICM transmission received.  Attempted call to patient regarding ICM remote transmission and left detailed message per DPR.  Advised to return call for any fluid symptoms or questions. Next ICM remote transmission scheduled 08/25/2020.

## 2020-07-23 NOTE — Progress Notes (Signed)
EPIC Encounter for ICM Monitoring  Patient Name: Alexandra [name] is a 87 y.o. female Date: 07/23/2020 Primary Care Physican: Lawerance Cruel, MD Primary Cardiologist: Skains/Bensimhon Electrophysiologist: Vergie Living Pacing:  98%   06/20/2020 Weight: 165 lbs                                                                  Attempted call to patient and unable to reach.  Left detailed message per DPR regarding transmission. Transmission reviewed.    Corvue Thoracic impedance suggesting possible fluid accumulation starting 6/29 and has returned close to baseline normal.    Prescribed:  Furosemide 40 mg Take one tablet (40 mg) by mouth once every other day. May take extra as needed.   Potassium 10 mEq 1 tablet daily.   LABS: 08/04/2018 Creatinine 1.79, BUN 34, Potassium 4.0, Sodium 137, GFR 25-29 03/10/2018 Creatinine 1.61, BUN 27, Potassium 4.3, Sodium 138, GFR 28-33 01/27/2018 Creatinine 1.55, BUN 27, Potassium 4.8, Sodium 141, GFR 30-34   Recommendations:  Left voice mail with ICM number and encouraged to call if experiencing any fluid symptoms.   Follow-up plan: ICM clinic phone appointment on 08/25/2020.  91 day device clinic remote transmission 09/15/2020.     EP/Cardiology Office Visits:  Recall 03/19/2021 with Dr Caryl Comes.  Recall 02/14/2021 with Dr Haroldine Laws   Copy of ICM check sent to Dr. Caryl Comes.   3 month ICM trend: 07/22/2020.    1 Year ICM trend:       [name], RN 07/23/2020 9:10 AM

## 2020-07-24 DIAGNOSIS — M6281 Muscle weakness (generalized): Secondary | ICD-10-CM | POA: Diagnosis not present

## 2020-07-24 DIAGNOSIS — R269 Unspecified abnormalities of gait and mobility: Secondary | ICD-10-CM | POA: Diagnosis not present

## 2020-07-24 DIAGNOSIS — M2569 Stiffness of other specified joint, not elsewhere classified: Secondary | ICD-10-CM | POA: Diagnosis not present

## 2020-07-24 DIAGNOSIS — R293 Abnormal posture: Secondary | ICD-10-CM | POA: Diagnosis not present

## 2020-07-24 DIAGNOSIS — R5383 Other fatigue: Secondary | ICD-10-CM | POA: Diagnosis not present

## 2020-07-29 DIAGNOSIS — R5383 Other fatigue: Secondary | ICD-10-CM | POA: Diagnosis not present

## 2020-07-29 DIAGNOSIS — M2569 Stiffness of other specified joint, not elsewhere classified: Secondary | ICD-10-CM | POA: Diagnosis not present

## 2020-07-29 DIAGNOSIS — R293 Abnormal posture: Secondary | ICD-10-CM | POA: Diagnosis not present

## 2020-07-29 DIAGNOSIS — R269 Unspecified abnormalities of gait and mobility: Secondary | ICD-10-CM | POA: Diagnosis not present

## 2020-07-29 DIAGNOSIS — M6281 Muscle weakness (generalized): Secondary | ICD-10-CM | POA: Diagnosis not present

## 2020-07-31 ENCOUNTER — Telehealth: Payer: Self-pay | Admitting: Internal Medicine

## 2020-07-31 DIAGNOSIS — M2569 Stiffness of other specified joint, not elsewhere classified: Secondary | ICD-10-CM | POA: Diagnosis not present

## 2020-07-31 DIAGNOSIS — R6889 Other general symptoms and signs: Secondary | ICD-10-CM | POA: Diagnosis not present

## 2020-07-31 DIAGNOSIS — M6281 Muscle weakness (generalized): Secondary | ICD-10-CM | POA: Diagnosis not present

## 2020-07-31 DIAGNOSIS — R293 Abnormal posture: Secondary | ICD-10-CM | POA: Diagnosis not present

## 2020-07-31 DIAGNOSIS — R5383 Other fatigue: Secondary | ICD-10-CM | POA: Diagnosis not present

## 2020-07-31 DIAGNOSIS — R269 Unspecified abnormalities of gait and mobility: Secondary | ICD-10-CM | POA: Diagnosis not present

## 2020-07-31 NOTE — Telephone Encounter (Signed)
Pt c/o Shortness Of Breath: STAT if SOB developed within the last 24 hours or pt is noticeably SOB on the phone  1. Are you currently SOB (can you hear that pt is SOB on the phone)? yes  2. How long have you been experiencing SOB? Couple of weeks   3. Are you SOB when sitting or when up moving around? Moving around  4. Are you currently experiencing any other symptoms? no

## 2020-07-31 NOTE — Telephone Encounter (Signed)
Took direct transfer triage call related to sob.  Pt calling because she is concerned that she has been short of breath when she participates with physical therapy.  Pt has history of trouble with her knees and has started with PT about 4 weeks ago.  Pt states she has not had any trouble with fluid overload-she has her device checked monthly by Sharman Cheek.  Per Pt she when she participates with PT she is able to hold a conversation with her therapist.  She was just concerned that she might have an issue.    Reassured Pt that if she can hold a conversation with her physical therapist while she exercises that she is doing fine.  Advised if she is working out so hard that she cannot have a conversation she should stop and rest.  Encouraged Pt to continue being active and hopefully her breathing will continue to improve.  Pt thanked nurse for call back.

## 2020-08-05 DIAGNOSIS — M6281 Muscle weakness (generalized): Secondary | ICD-10-CM | POA: Diagnosis not present

## 2020-08-05 DIAGNOSIS — R293 Abnormal posture: Secondary | ICD-10-CM | POA: Diagnosis not present

## 2020-08-05 DIAGNOSIS — R5383 Other fatigue: Secondary | ICD-10-CM | POA: Diagnosis not present

## 2020-08-05 DIAGNOSIS — R6889 Other general symptoms and signs: Secondary | ICD-10-CM | POA: Diagnosis not present

## 2020-08-05 DIAGNOSIS — M2569 Stiffness of other specified joint, not elsewhere classified: Secondary | ICD-10-CM | POA: Diagnosis not present

## 2020-08-05 DIAGNOSIS — R269 Unspecified abnormalities of gait and mobility: Secondary | ICD-10-CM | POA: Diagnosis not present

## 2020-08-12 DIAGNOSIS — R293 Abnormal posture: Secondary | ICD-10-CM | POA: Diagnosis not present

## 2020-08-12 DIAGNOSIS — M6281 Muscle weakness (generalized): Secondary | ICD-10-CM | POA: Diagnosis not present

## 2020-08-12 DIAGNOSIS — R269 Unspecified abnormalities of gait and mobility: Secondary | ICD-10-CM | POA: Diagnosis not present

## 2020-08-12 DIAGNOSIS — M2569 Stiffness of other specified joint, not elsewhere classified: Secondary | ICD-10-CM | POA: Diagnosis not present

## 2020-08-12 DIAGNOSIS — R6889 Other general symptoms and signs: Secondary | ICD-10-CM | POA: Diagnosis not present

## 2020-08-12 DIAGNOSIS — R5383 Other fatigue: Secondary | ICD-10-CM | POA: Diagnosis not present

## 2020-08-21 DIAGNOSIS — R269 Unspecified abnormalities of gait and mobility: Secondary | ICD-10-CM | POA: Diagnosis not present

## 2020-08-21 DIAGNOSIS — M6281 Muscle weakness (generalized): Secondary | ICD-10-CM | POA: Diagnosis not present

## 2020-08-21 DIAGNOSIS — R293 Abnormal posture: Secondary | ICD-10-CM | POA: Diagnosis not present

## 2020-08-21 DIAGNOSIS — R5383 Other fatigue: Secondary | ICD-10-CM | POA: Diagnosis not present

## 2020-08-21 DIAGNOSIS — M2569 Stiffness of other specified joint, not elsewhere classified: Secondary | ICD-10-CM | POA: Diagnosis not present

## 2020-08-25 ENCOUNTER — Ambulatory Visit (INDEPENDENT_AMBULATORY_CARE_PROVIDER_SITE_OTHER): Payer: Medicare HMO

## 2020-08-25 DIAGNOSIS — Z950 Presence of cardiac pacemaker: Secondary | ICD-10-CM | POA: Diagnosis not present

## 2020-08-25 DIAGNOSIS — I5022 Chronic systolic (congestive) heart failure: Secondary | ICD-10-CM | POA: Diagnosis not present

## 2020-08-27 NOTE — Progress Notes (Signed)
EPIC Encounter for ICM Monitoring  Patient Name: Alexandra [name] is a 87 y.o. female Date: 08/27/2020 Primary Care Physican: Lawerance Cruel, MD Primary Cardiologist: Skains/Bensimhon Electrophysiologist: Vergie Living Pacing:  97%   06/20/2020 Weight: 165 lbs                                                                  Spoke with patient and heart failure questions reviewed.  Pt asymptomatic for fluid accumulation and feeling well.  She is taking PT to help with strengthening.   Corvue Thoracic impedance normal but was suggesting possible fluid accumulation from 8/3-8/6.    Prescribed:  Furosemide 40 mg Take one tablet (40 mg) by mouth once every other day. May take extra as needed.   Potassium 10 mEq 1 tablet daily.   LABS: 08/04/2018 Creatinine 1.79, BUN 34, Potassium 4.0, Sodium 137, GFR 25-29 03/10/2018 Creatinine 1.61, BUN 27, Potassium 4.3, Sodium 138, GFR 28-33 01/27/2018 Creatinine 1.55, BUN 27, Potassium 4.8, Sodium 141, GFR 30-34   Recommendations:   No changes and encouraged to call if experiencing any fluid symptoms.   Follow-up plan: ICM clinic phone appointment on 09/29/2020.  91 day device clinic remote transmission 09/15/2020.     EP/Cardiology Office Visits:  Recall 03/19/2021 with Dr Caryl Comes.  Recall 02/14/2021 with Dr Haroldine Laws   Copy of ICM check sent to Dr. Caryl Comes.   3 month ICM trend: 08/25/2020.    1 Year ICM trend:       [name], RN 08/27/2020 11:57 AM

## 2020-08-28 DIAGNOSIS — R269 Unspecified abnormalities of gait and mobility: Secondary | ICD-10-CM | POA: Diagnosis not present

## 2020-08-28 DIAGNOSIS — R5383 Other fatigue: Secondary | ICD-10-CM | POA: Diagnosis not present

## 2020-08-28 DIAGNOSIS — M2569 Stiffness of other specified joint, not elsewhere classified: Secondary | ICD-10-CM | POA: Diagnosis not present

## 2020-08-28 DIAGNOSIS — R293 Abnormal posture: Secondary | ICD-10-CM | POA: Diagnosis not present

## 2020-08-28 DIAGNOSIS — M6281 Muscle weakness (generalized): Secondary | ICD-10-CM | POA: Diagnosis not present

## 2020-09-09 ENCOUNTER — Telehealth: Payer: Self-pay

## 2020-09-09 NOTE — Telephone Encounter (Signed)
Pt called and would like to set up to get gel injections again.  Please advise

## 2020-09-10 ENCOUNTER — Telehealth: Payer: Self-pay | Admitting: Family Medicine

## 2020-09-10 NOTE — Telephone Encounter (Signed)
I called and advised the patient she will be getting a call to schedule an appointment for the injections, once prior authorization has been obtained.

## 2020-09-10 NOTE — Telephone Encounter (Signed)
Noted  

## 2020-09-10 NOTE — Telephone Encounter (Signed)
Requesting approval for gel injections. 

## 2020-09-10 NOTE — Telephone Encounter (Signed)
Last had in 2021. Please advise.

## 2020-09-15 ENCOUNTER — Ambulatory Visit (INDEPENDENT_AMBULATORY_CARE_PROVIDER_SITE_OTHER): Payer: Medicare HMO

## 2020-09-15 DIAGNOSIS — I428 Other cardiomyopathies: Secondary | ICD-10-CM

## 2020-09-16 DIAGNOSIS — M2041 Other hammer toe(s) (acquired), right foot: Secondary | ICD-10-CM | POA: Diagnosis not present

## 2020-09-16 DIAGNOSIS — I70293 Other atherosclerosis of native arteries of extremities, bilateral legs: Secondary | ICD-10-CM | POA: Diagnosis not present

## 2020-09-16 DIAGNOSIS — M21612 Bunion of left foot: Secondary | ICD-10-CM | POA: Diagnosis not present

## 2020-09-16 DIAGNOSIS — M21611 Bunion of right foot: Secondary | ICD-10-CM | POA: Diagnosis not present

## 2020-09-16 LAB — CUP PACEART REMOTE DEVICE CHECK
Battery Remaining Longevity: 10 mo
Battery Remaining Percentage: 12 %
Battery Voltage: 2.81 V
Brady Statistic AP VP Percent: 1 %
Brady Statistic AP VS Percent: 1 %
Brady Statistic AS VP Percent: 97 %
Brady Statistic AS VS Percent: 2.4 %
Brady Statistic RA Percent Paced: 1 %
Date Time Interrogation Session: 20220829020015
Implantable Lead Implant Date: 20161229
Implantable Lead Implant Date: 20161229
Implantable Lead Implant Date: 20161229
Implantable Lead Location: 753858
Implantable Lead Location: 753859
Implantable Lead Location: 753860
Implantable Pulse Generator Implant Date: 20161229
Lead Channel Impedance Value: 1050 Ohm
Lead Channel Impedance Value: 430 Ohm
Lead Channel Impedance Value: 480 Ohm
Lead Channel Pacing Threshold Amplitude: 0.625 V
Lead Channel Pacing Threshold Amplitude: 0.75 V
Lead Channel Pacing Threshold Amplitude: 0.75 V
Lead Channel Pacing Threshold Pulse Width: 0.5 ms
Lead Channel Pacing Threshold Pulse Width: 0.5 ms
Lead Channel Pacing Threshold Pulse Width: 1.5 ms
Lead Channel Sensing Intrinsic Amplitude: 12 mV
Lead Channel Sensing Intrinsic Amplitude: 2.4 mV
Lead Channel Setting Pacing Amplitude: 1.625
Lead Channel Setting Pacing Amplitude: 2 V
Lead Channel Setting Pacing Amplitude: 2 V
Lead Channel Setting Pacing Pulse Width: 0.5 ms
Lead Channel Setting Pacing Pulse Width: 1.5 ms
Lead Channel Setting Sensing Sensitivity: 2 mV
Pulse Gen Model: 3262
Pulse Gen Serial Number: 7834528

## 2020-09-18 ENCOUNTER — Telehealth: Payer: Self-pay | Admitting: Family Medicine

## 2020-09-18 NOTE — Telephone Encounter (Signed)
Pt called wanting to know if she had been approved for her gel injections? If so she would like a CB so she can make an appt with Dr.Blackman.   [phone]

## 2020-09-18 NOTE — Telephone Encounter (Signed)
Talked with patient concerning gel injection.  Appointment has been made.

## 2020-09-21 ENCOUNTER — Telehealth (HOSPITAL_COMMUNITY): Payer: Self-pay | Admitting: Student

## 2020-09-21 ENCOUNTER — Emergency Department (HOSPITAL_COMMUNITY): Payer: Medicare HMO

## 2020-09-21 ENCOUNTER — Other Ambulatory Visit: Payer: Self-pay

## 2020-09-21 ENCOUNTER — Telehealth: Payer: Self-pay | Admitting: Surgery

## 2020-09-21 ENCOUNTER — Emergency Department (HOSPITAL_COMMUNITY)
Admission: EM | Admit: 2020-09-21 | Discharge: 2020-09-21 | Disposition: A | Discharge status: Home / Self Care | Payer: Medicare HMO | Attending: Emergency Medicine | Admitting: Emergency Medicine

## 2020-09-21 ENCOUNTER — Encounter (HOSPITAL_COMMUNITY): Payer: Self-pay | Admitting: Radiology

## 2020-09-21 DIAGNOSIS — I130 Hypertensive heart and chronic kidney disease with heart failure and stage 1 through stage 4 chronic kidney disease, or unspecified chronic kidney disease: Secondary | ICD-10-CM | POA: Insufficient documentation

## 2020-09-21 DIAGNOSIS — R52 Pain, unspecified: Secondary | ICD-10-CM

## 2020-09-21 DIAGNOSIS — E039 Hypothyroidism, unspecified: Secondary | ICD-10-CM | POA: Diagnosis not present

## 2020-09-21 DIAGNOSIS — M25551 Pain in right hip: Secondary | ICD-10-CM

## 2020-09-21 DIAGNOSIS — M79604 Pain in right leg: Secondary | ICD-10-CM | POA: Diagnosis not present

## 2020-09-21 DIAGNOSIS — J45909 Unspecified asthma, uncomplicated: Secondary | ICD-10-CM | POA: Diagnosis not present

## 2020-09-21 DIAGNOSIS — M549 Dorsalgia, unspecified: Secondary | ICD-10-CM | POA: Insufficient documentation

## 2020-09-21 DIAGNOSIS — I5022 Chronic systolic (congestive) heart failure: Secondary | ICD-10-CM | POA: Insufficient documentation

## 2020-09-21 DIAGNOSIS — M25559 Pain in unspecified hip: Secondary | ICD-10-CM

## 2020-09-21 DIAGNOSIS — Z79899 Other long term (current) drug therapy: Secondary | ICD-10-CM | POA: Insufficient documentation

## 2020-09-21 DIAGNOSIS — Z7901 Long term (current) use of anticoagulants: Secondary | ICD-10-CM | POA: Insufficient documentation

## 2020-09-21 DIAGNOSIS — I10 Essential (primary) hypertension: Secondary | ICD-10-CM | POA: Diagnosis not present

## 2020-09-21 DIAGNOSIS — R109 Unspecified abdominal pain: Secondary | ICD-10-CM | POA: Diagnosis not present

## 2020-09-21 DIAGNOSIS — R509 Fever, unspecified: Secondary | ICD-10-CM | POA: Diagnosis not present

## 2020-09-21 DIAGNOSIS — R1031 Right lower quadrant pain: Secondary | ICD-10-CM | POA: Insufficient documentation

## 2020-09-21 DIAGNOSIS — R0689 Other abnormalities of breathing: Secondary | ICD-10-CM | POA: Diagnosis not present

## 2020-09-21 DIAGNOSIS — R000 Tachycardia, unspecified: Secondary | ICD-10-CM | POA: Insufficient documentation

## 2020-09-21 DIAGNOSIS — Z853 Personal history of malignant neoplasm of breast: Secondary | ICD-10-CM | POA: Diagnosis not present

## 2020-09-21 DIAGNOSIS — K7689 Other specified diseases of liver: Secondary | ICD-10-CM | POA: Diagnosis not present

## 2020-09-21 DIAGNOSIS — N183 Chronic kidney disease, stage 3 unspecified: Secondary | ICD-10-CM | POA: Diagnosis not present

## 2020-09-21 HISTORY — DX: Bursopathy, unspecified: M71.9

## 2020-09-21 LAB — COMPREHENSIVE METABOLIC PANEL
ALT: 13 U/L (ref 0–44)
AST: 22 U/L (ref 15–41)
Albumin: 4.6 g/dL (ref 3.5–5.0)
Alkaline Phosphatase: 35 U/L — ABNORMAL LOW (ref 38–126)
Anion gap: 13 (ref 5–15)
BUN: 40 mg/dL — ABNORMAL HIGH (ref 8–23)
CO2: 25 mmol/L (ref 22–32)
Calcium: 9.7 mg/dL (ref 8.9–10.3)
Chloride: 97 mmol/L — ABNORMAL LOW (ref 98–111)
Creatinine, Ser: 1.79 mg/dL — ABNORMAL HIGH (ref 0.44–1.00)
GFR, Estimated: 26 mL/min — ABNORMAL LOW (ref >60)
Glucose, Bld: 122 mg/dL — ABNORMAL HIGH (ref 70–99)
Potassium: 4.1 mmol/L (ref 3.5–5.1)
Sodium: 135 mmol/L (ref 135–145)
Total Bilirubin: 0.4 mg/dL (ref 0.3–1.2)
Total Protein: 8 g/dL (ref 6.5–8.1)

## 2020-09-21 LAB — CBC WITH DIFFERENTIAL/PLATELET
Abs Immature Granulocytes: 0.02 10*3/uL (ref 0.00–0.07)
Basophils Absolute: 0 10*3/uL (ref 0.0–0.1)
Basophils Relative: 0 %
Eosinophils Absolute: 0.1 10*3/uL (ref 0.0–0.5)
Eosinophils Relative: 1 %
HCT: 38.5 % (ref 36.0–46.0)
Hemoglobin: 12.7 g/dL (ref 12.0–15.0)
Immature Granulocytes: 0 %
Lymphocytes Relative: 32 %
Lymphs Abs: 2.9 10*3/uL (ref 0.7–4.0)
MCH: 32.7 pg (ref 26.0–34.0)
MCHC: 33 g/dL (ref 30.0–36.0)
MCV: 99.2 fL (ref 80.0–100.0)
Monocytes Absolute: 0.6 10*3/uL (ref 0.1–1.0)
Monocytes Relative: 7 %
Neutro Abs: 5.3 10*3/uL (ref 1.7–7.7)
Neutrophils Relative %: 60 %
Platelets: 172 10*3/uL (ref 150–400)
RBC: 3.88 MIL/uL (ref 3.87–5.11)
RDW: 12.7 % (ref 11.5–15.5)
WBC: 8.9 10*3/uL (ref 4.0–10.5)
nRBC: 0 % (ref 0.0–0.2)

## 2020-09-21 LAB — URINALYSIS, ROUTINE W REFLEX MICROSCOPIC
Bacteria, UA: NONE SEEN
Bilirubin Urine: NEGATIVE
Glucose, UA: NEGATIVE mg/dL
Hgb urine dipstick: NEGATIVE
Ketones, ur: NEGATIVE mg/dL
Nitrite: NEGATIVE
Protein, ur: NEGATIVE mg/dL
Specific Gravity, Urine: 1.01 (ref 1.005–1.030)
pH: 8 (ref 5.0–8.0)

## 2020-09-21 LAB — LACTIC ACID, PLASMA: Lactic Acid, Venous: 1.9 mmol/L (ref 0.5–1.9)

## 2020-09-21 LAB — LIPASE, BLOOD: Lipase: 32 U/L (ref 11–51)

## 2020-09-21 MED ORDER — ACETAMINOPHEN 325 MG PO TABS
650.0000 mg | ORAL_TABLET | Freq: Once | ORAL | Status: AC
  Administered 2020-09-21: 650 mg via ORAL
  Filled 2020-09-21: qty 2

## 2020-09-21 MED ORDER — ONDANSETRON HCL 4 MG/2ML IJ SOLN
4.0000 mg | Freq: Once | INTRAMUSCULAR | Status: AC
  Administered 2020-09-21: 4 mg via INTRAVENOUS
  Filled 2020-09-21: qty 2

## 2020-09-21 MED ORDER — OXYCODONE-ACETAMINOPHEN 5-325 MG PO TABS: 1.0000 | ORAL_TABLET | Freq: Four times a day (QID) | ORAL | 0 refills | Status: DC | PRN

## 2020-09-21 MED ORDER — FENTANYL CITRATE PF 50 MCG/ML IJ SOSY
75.0000 ug | PREFILLED_SYRINGE | Freq: Once | INTRAMUSCULAR | Status: AC
  Administered 2020-09-21: 75 ug via INTRAVENOUS
  Filled 2020-09-21: qty 2

## 2020-09-21 MED ORDER — MORPHINE SULFATE (PF) 2 MG/ML IV SOLN
2.0000 mg | Freq: Once | INTRAVENOUS | Status: AC
Start: 2020-09-21 — End: 2020-09-21
  Administered 2020-09-21: 2 mg via INTRAVENOUS
  Filled 2020-09-21: qty 1

## 2020-09-21 MED ORDER — IOHEXOL 9 MG/ML PO SOLN
500.0000 mL | ORAL | Status: AC
  Administered 2020-09-21: 500 mL via ORAL

## 2020-09-21 NOTE — ED Provider Notes (Signed)
Patient is 87 year old female whose care I assumed from [place] at shift change.  Please see her note for complete history.  Patient has been having right hip pain for about a week, it is intermittent but is worse in the last 24 hours.  She is able to ambulate with a cane and walker at baseline, but the pain is now wrapping into the abdominal area which is new.  She has been having nausea without vomiting, she did have a fever as well as low tachycardia at 101 when she came into the hospital so septic work-up was initiated.  At this moment pain is controlled, patient is able to ambulate with difficulty.  Physical Exam  BP 130/69   Pulse 64   Temp (!) 100.4 F (38 C) (Oral)   Resp 16   Ht 5\' 4"  (1.626 m)   Wt 75.3 kg   SpO2 97%   BMI 28.49 kg/m   Physical Exam Vitals and nursing note reviewed. Exam conducted with a chaperone present.  Constitutional:      General: She is not in acute distress.    Appearance: Normal appearance.  HENT:     Head: Normocephalic and atraumatic.  Eyes:     General: No scleral icterus.    Extraocular Movements: Extraocular movements intact.     Pupils: Pupils are equal, round, and reactive to light.  Musculoskeletal:        General: Tenderness present.     Comments: No erythema or warmth to the hip.  Patient is able to tolerate range of motion.  No palpable deformity.  Skin:    Coloration: Skin is not jaundiced.  Neurological:     Mental Status: She is alert. Mental status is at baseline.     Coordination: Coordination normal.    ED Course/Procedures   Clinical Course as of 09/21/20 1406  Sun Sep 21, 2020  0925 CT ABDOMEN PELVIS WO CONTRAST [HS]    Clinical Course User Index [HS] Sherrill Raring, PA-C    Procedures  MDM  CT does not show any signs of intra-abdominal pathology.  Urine is negative for UTI.  She does not have a leukocytosis, fever has resolved, and she is able to tolerate range of motion so lower suspicion for septic joint at this time.   She has no focal tenderness in the back, no right CVA tenderness.  CT abdomen was evaluated and she does not have any signs of appendicitis.    Overall, patient's work-up is very reassuring.  She is not septic, she is able to ambulate although with difficulty.  I did discuss the possibility of admitting the patient with hospitalist Dr. Marylyn Ishihara, he does not think the patient meets inpatient criteria.  Given that there is no physiologic deformity to the hip and that she is not had a fever since being in the hospital and has had stable vital signs he does not think she meets requirement for observation.  I put in consult for social work to arrange for home health needs, will have patient follow-up with orthopedic doctor next week.  Pain medicine prescribed.  Discussed HPI, physical exam and plan of care for this patient with attending S. Pearline Cables. The attending physician evaluated this patient as part of a shared visit and agrees with plan of care.     Sherrill Raring, PA-C 09/21/20 [place], [place], DO 09/21/20 [phone]

## 2020-09-21 NOTE — Telephone Encounter (Signed)
ED Care Manager received message from [place] to contact patient and family to inform them that [facility] will provide [facility] service.  Contacted patient's daughter Shemica Meath to provide this updated information. Explained that A rep should contact them within 1-2  business days post discharge. Also provided [facility] contact information to follow up.

## 2020-09-21 NOTE — TOC Initial Note (Signed)
Transition of Care [facility]) - Initial/Assessment Note    Patient Details  Name: Alexandra [name] MRN: [id number] Date of Birth: [birth year]  Transition of Care [facility]) CM/SW Contact:    Verdell Carmine, RN Phone Number: 09/21/2020, 1:38 PM  Clinical Narrative:                  87 year old patient in to the ED with  right leg pain She has been getting knee injections, due for one later in the month. She has a difficult time obtaining transportation, discussed with PA for [facility] PT OT as a option. Patient states she has a walker, so this was not ordered. Patient had already left by the time of consult, talked to patient over the phone and she is amenable to home health. [facility] for acceptance.    Barriers to Discharge: ED No Barriers   Patient Goals and CMS Choice        Expected Discharge Plan and Services                                     HH Arranged: PT, OT HH Agency: [facility] Date [facility] Agency Contacted: 09/21/20 Time [place]: [id number] Representative spoke with at [place]: Adela Lank  Prior Living Arrangements/Services     Patient language and need for interpreter reviewed:: Yes        Need for Family Participation in Patient Care: Yes (Comment) Care giver support system in place?: Yes (comment)   Criminal Activity/Legal Involvement Pertinent to Current Situation/Hospitalization: No - Comment as needed  Activities of Daily Living      Permission Sought/Granted   Permission granted to share information with : Yes, Verbal Permission Granted              Emotional Assessment   Attitude/Demeanor/Rapport: Gracious   Orientation: : Oriented to Situation, Oriented to  Time, Oriented to Place, Oriented to Self Alcohol / Substance Use: Not Applicable Psych Involvement: No (comment)  Admission diagnosis:  severe pain from right knee up to her back  Patient Active Problem List   Diagnosis Date Noted   Non-ischemic cardiomyopathy  ([place]) 03/18/2020   Atrial tachycardia ([place]) 03/18/2020   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 03/18/2020   Unilateral primary osteoarthritis, right knee 07/04/2017   Dyspnea    Chronic systolic heart failure ([place])    Pain in left shin 02/07/2015   Leukocytosis 15/17/6160   Chronic systolic CHF (congestive heart failure), NYHA class 3 ([place]) 01/16/2015   Protein-calorie malnutrition, severe ([place]) 01/15/2015   Infection due to Corynebacterium diphtheriae 12/24/2014   Bacteremia    Fever 73/71/0626   Chronic systolic CHF (congestive heart failure), NYHA class 4 ([place]) 10/23/2014   Situational depression    Frequent headaches    Pulmonary embolism ([place]) 10/10/2014   Chronic anticoagulation 10/10/2014   Takotsubo syndrome 08/19/2014   HCAP (healthcare-associated pneumonia) 08/17/2014   H/O Asthma 08/16/2014   LBBB (left bundle branch block) 08/16/2014   Cardiomyopathy- etiology not yet determined 08/16/2014   PSVT (paroxysmal supraventricular tachycardia) ([place]) 08/16/2014   CKD (chronic kidney disease) stage 3, GFR 30-59 ml/min ([place]) 08/13/2014   Anemia of chronic disease 08/13/2014   Hypothyroidism 08/13/2014   Acute cholecystitis 08/11/2014   Abdominal pain 08/11/2014   Essential (primary) hypertension 04/02/2014   PCP:  Lawerance Cruel, MD Pharmacy:   [facility] [place], [address] [place] [address] Phone: [phone] Fax: [phone]  [facility] [place], [address] [place] [address] Phone: [phone] Fax: [phone]     Social Determinants of Health (SDOH) Interventions    Readmission Risk Interventions No flowsheet data found.

## 2020-09-21 NOTE — ED Notes (Signed)
Pt drinking her oral contrast.

## 2020-09-21 NOTE — ED Provider Notes (Signed)
Alexandra [name]   CSN: [id number] Arrival date & time: 09/21/20  0359     History Chief Complaint  Patient presents with   Leg Pain    Pain that goes from her knee to her back    Alexandra [name] is a 87 y.o. female with a history of right hip bursitis, CAD, congestive heart failure, osteoarthritis, osteopenia, HTN, CKD stage III sacroiliitis who presents the emergency department EMS with a chief complaint of right leg pain.  The patient reports that she has been having pain shooting from her hip down to her knee intermittently over the last week.  She was previously getting injections in her knee, but is not scheduled for a repeat injection until later this month.  She has been able to walk, but walking makes her pain worse.  Pain has been worsening since onset and now wraps around her right lower abdomen.  She had 1 episode of nausea earlier this week, but it has not recurred she denies vomiting, diarrhea, constipation, fever, chills, chest pain, shortness of breath, numbness, weakness, dysuria, hematuria, vaginal bleeding or discharge.  She is followed by Dr. Ninfa Linden with orthopedics.  No recent falls or injuries.  She took 1000 mg of Tylenol for her pain around midnight with no improvement.  The patient's daughter reports that her pain has been pretty constant over the last few days, but prior to calling 911 tonight, the patient was doubled over in pain.  Ambulates with a cane and a walker at baseline.  The history is provided by the patient and medical records. No language interpreter was used.      Past Medical History:  Diagnosis Date   Adult hypothyroidism 04/02/2014   Anemia of chronic disease 08/13/2014   Anxiety    Arthritis    Asthma    breast ca dx'd 2000   left   Bursitis    right hip   CAP (community acquired pneumonia) 12/19/2012   Chest pain    NM stress test 05/2011 Normal   CHF (congestive heart failure) ([place])  08/19/2014   CKD (chronic kidney disease) stage 3, GFR 30-59 ml/min (HCC) 08/13/2014   Depression    DOE (dyspnea on exertion)    No SOB at rest   Essential (primary) hypertension 04/02/2014   Fatigue    excertional   UDJSHFWY(637.8)    Hereditary and idiopathic peripheral neuropathy 07/02/2014   Hypercholesteremia    Intermittent LBBB (left bundle branch block) 12/21/2012   Leg weakness    Neuropathy    Obesity    Osteopenia    Peripheral neuropathy    Presence of permanent cardiac pacemaker 01/16/2015   BIV   Sacroiliitis, not elsewhere classified ([place])    Situational depression    Trigger finger    [name]    Patient Active Problem List   Diagnosis Date Noted   Non-ischemic cardiomyopathy ([place]) 03/18/2020   Atrial tachycardia ([place]) 03/18/2020   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 03/18/2020   Unilateral primary osteoarthritis, right knee 07/04/2017   Dyspnea    Chronic systolic heart failure ([place])    Pain in left shin 02/07/2015   Leukocytosis 58/85/0277   Chronic systolic CHF (congestive heart failure), NYHA class 3 ([place]) 01/16/2015   Protein-calorie malnutrition, severe ([place]) 01/15/2015   Infection due to Corynebacterium diphtheriae 12/24/2014   Bacteremia    Fever 41/28/7867   Chronic systolic CHF (congestive heart failure), NYHA class 4 ([place]) 10/23/2014  Situational depression    Frequent headaches    Pulmonary embolism ([place]) 10/10/2014   Chronic anticoagulation 10/10/2014   Takotsubo syndrome 08/19/2014   HCAP (healthcare-associated pneumonia) 08/17/2014   H/O Asthma 08/16/2014   LBBB (left bundle branch block) 08/16/2014   Cardiomyopathy- etiology not yet determined 08/16/2014   PSVT (paroxysmal supraventricular tachycardia) ([place]) 08/16/2014   CKD (chronic kidney disease) stage 3, GFR 30-59 ml/min (HCC) 08/13/2014   Anemia of chronic disease 08/13/2014   Hypothyroidism 08/13/2014   Acute cholecystitis 08/11/2014    Abdominal pain 08/11/2014   Essential (primary) hypertension 04/02/2014    Past Surgical History:  Procedure Laterality Date   ACHILLES TENDON SURGERY     BREAST LUMPECTOMY     breast cancer   CARDIAC CATHETERIZATION N/A 07/21/2015   Procedure: Right Heart Cath;  Surgeon: Jolaine Artist, MD;  Location: [facility];  Service: Cardiovascular;  Laterality: N/A;   CHOLECYSTECTOMY N/A 08/13/2014   Procedure: LAPAROSCOPIC CHOLECYSTECTOMY;  Surgeon: Stark Klein, MD;  Location: [facility];  Service: General;  Laterality: N/A;   EP IMPLANTABLE DEVICE N/A 01/16/2015   Procedure: BiV Pacemaker Insertion CRT-P;  Surgeon: Deboraha Sprang, MD;  Location: [facility];  Service: Cardiovascular;  Laterality: N/A;   LEG SURGERY     LUMBAR LAMINECTOMY     POLYPECTOMY     TONSILLECTOMY     VESICOVAGINAL FISTULA CLOSURE W/ TAH     DC hysterectomy     OB History   No obstetric history on file.     Family History  Problem Relation Age of Onset   Pneumonia Mother 73       cause of death   Diabetes Brother    Pancreatic cancer Father 13   Alzheimer's disease Sister    CVA Daughter        01/21/09   Stroke Daughter    Hypertension Daughter    Heart attack Neg Hx     Social History   Tobacco Use   Smoking status: Never   Smokeless tobacco: Never  Vaping Use   Vaping Use: Never used  Substance Use Topics   Alcohol use: No    Alcohol/week: 0.0 standard drinks   Drug use: No    Home Medications Prior to Admission medications   Medication Sig Start Date End Date Taking? Authorizing Provider  acetaminophen (TYLENOL) 500 MG tablet Take 500 mg by mouth every 6 (six) hours as needed for mild pain or headache. Reported on 06/17/2015    [provider]  albuterol (PROVENTIL HFA;VENTOLIN HFA) 108 (90 BASE) MCG/ACT inhaler Inhale 2 puffs into the lungs every 6 (six) hours as needed for wheezing or shortness of breath. Reported on 05/27/2015    [provider]  ALPRAZolam  Duanne Moron) 0.25 MG tablet Take 0.25 mg by mouth 3 (three) times daily as needed for anxiety. Reported on 05/27/2015    [provider]  amoxicillin (AMOXIL) 500 MG capsule Take 4 capsules by mouth as needed 1 hour before dental work 12/04/15   Bensimhon, Shaune Pascal, MD  b complex vitamins tablet Take 1 tablet by mouth daily with lunch.     [provider]  calcium carbonate (TUMS - DOSED IN MG ELEMENTAL CALCIUM) 500 MG chewable tablet Chew 2 tablets by mouth daily as needed for indigestion or heartburn. Reported on 07/18/2015    [provider]  cholecalciferol (VITAMIN D) 1000 UNITS tablet Take 1,000 Units by mouth daily with lunch.     [provider]  citalopram (CELEXA) 20 MG tablet TAKE 1 TABLET(20 MG) BY MOUTH DAILY 01/13/16   Bensimhon, Shaune Pascal, MD  ELIQUIS 2.5 MG TABS tablet TAKE 1 TABLET(2.5 MG) BY MOUTH TWICE DAILY 03/26/20   Bensimhon, Shaune Pascal, MD  famotidine (PEPCID) 20 MG tablet Take 20 mg by mouth daily.    [provider]  ferrous sulfate 325 (65 FE) MG tablet Take 325 mg by mouth daily with breakfast.    [provider]  furosemide (LASIX) 40 MG tablet Take one tablet (40 mg) by mouth once every other day. May take extra as needed. 03/10/18   Georgiana Shore, NP  hydrALAZINE (APRESOLINE) 25 MG tablet Take 1 tablet (25 mg total) by mouth 2 (two) times daily. 01/27/18   Deboraha Sprang, MD  lactose free nutrition (BOOST) LIQD Take 237 mLs by mouth daily with breakfast.     [provider]  levothyroxine (SYNTHROID, LEVOTHROID) 25 MCG tablet Take 25 mcg by mouth daily before breakfast. for thyroid    [provider]  MAGNESIUM-OXIDE 400 (241.3 Mg) MG tablet TAKE 1/2 TABLET(200 MG) BY MOUTH DAILY 03/17/17   Larey Dresser, MD  Multiple Vitamin (MULITIVITAMIN WITH MINERALS) TABS Take 1 tablet by mouth daily with lunch.     [provider]  nitroGLYCERIN (NITROSTAT) 0.4 MG SL tablet Place 1 tablet (0.4 mg total) under  the tongue every 5 (five) minutes as needed for chest pain. 11/22/14   Bensimhon, Shaune Pascal, MD  Polyethyl Glycol-Propyl Glycol (SYSTANE OP) Place 1 drop into both eyes 2 (two) times daily.    [provider]  potassium chloride (K-DUR) 10 MEQ tablet TAKE 1 TABLET BY MOUTH DAILY 02/17/17   Bensimhon, Shaune Pascal, MD  spironolactone (ALDACTONE) 25 MG tablet Take 25 mg by mouth daily.    [provider]    Allergies    Amiodarone, Cymbalta [duloxetine hcl], and Lyrica [pregabalin]  Review of Systems   Review of Systems  Constitutional:  Negative for activity change, chills and fever.  HENT:  Negative for congestion and sore throat.   Respiratory:  Negative for shortness of breath.   Cardiovascular:  Negative for chest pain and palpitations.  Gastrointestinal:  Positive for nausea. Negative for abdominal pain, constipation, diarrhea and vomiting.  Genitourinary:  Negative for dysuria, flank pain and urgency.  Musculoskeletal:  Positive for arthralgias, back pain, gait problem and myalgias. Negative for neck pain and neck stiffness.  Skin:  Negative for color change, rash and wound.  Allergic/Immunologic: Negative for immunocompromised state.  Neurological:  Negative for dizziness, seizures, syncope, weakness, numbness and headaches.  Psychiatric/Behavioral:  Negative for confusion.    Physical Exam Updated Vital Signs BP 130/69   Pulse 64   Temp 97.9 F (36.6 C) (Oral)   Resp 16   Ht 5\' 4"  (1.626 m)   Wt 75.3 kg   SpO2 97%   BMI 28.49 kg/m   Physical Exam Vitals and nursing [name] reviewed.  Constitutional:      General: She is not in acute distress.    Appearance: She is not ill-appearing, toxic-appearing or diaphoretic.  HENT:     Head: Normocephalic.  Eyes:     Conjunctiva/sclera: Conjunctivae normal.  Cardiovascular:     Rate and Rhythm: Normal rate and regular rhythm.     Heart sounds: No murmur heard.   No friction rub. No gallop.  Pulmonary:     Effort:  Pulmonary effort is normal. No respiratory distress.  Breath sounds: No stridor. No wheezing, rhonchi or rales.  Chest:     Chest wall: No tenderness.  Abdominal:     General: There is no distension.     Palpations: Abdomen is soft.     Comments: She has right CVA tenderness.  No left CVA tenderness.  Tender to palpation in the right lower quadrant.  Abdomen is soft and nondistended.  No rebound or guarding.  Musculoskeletal:     Cervical back: Neck supple.     Comments: Right knee is slightly more swollen when compared to the left.  She does have medial and lateral joint line tenderness to palpation.  She has pain in her right hip with range of motion of her right knee.  No focal tenderness to palpation over the right hip.  No tenderness to the spinous processes or bilateral paraspinal muscles.  Skin:    General: Skin is warm.     Findings: No rash.  Neurological:     Mental Status: She is alert.  Psychiatric:        Behavior: Behavior normal.    ED Results / Procedures / Treatments   Labs (all labs ordered are listed, but only abnormal results are displayed) Labs Reviewed  COMPREHENSIVE METABOLIC PANEL - Abnormal; Notable for the following components:      Result Value   Chloride 97 (*)    Glucose, Bld 122 (*)    BUN 40 (*)    Creatinine, Ser 1.79 (*)    Alkaline Phosphatase 35 (*)    GFR, Estimated 26 (*)    All other components within normal limits  CULTURE, BLOOD (ROUTINE X 2)  CULTURE, BLOOD (ROUTINE X 2)  CBC WITH DIFFERENTIAL/PLATELET  LACTIC ACID, PLASMA  LIPASE, BLOOD  URINALYSIS, ROUTINE W REFLEX MICROSCOPIC    EKG None  Radiology No results found.  Procedures Procedures   Medications Ordered in ED Medications  iohexol (OMNIPAQUE) 9 MG/ML oral solution 500 mL (has no administration in time range)  acetaminophen (TYLENOL) tablet 650 mg (has no administration in time range)  fentaNYL (SUBLIMAZE) injection 75 mcg (75 mcg Intravenous Given 09/21/20 0423)   morphine 2 MG/ML injection 2 mg (2 mg Intravenous Given 09/21/20 0528)  ondansetron (ZOFRAN) injection 4 mg (4 mg Intravenous Given 09/21/20 0527)    ED Course  I have reviewed the triage vital signs and the nursing notes.  Pertinent labs & imaging results that were available during my care of the patient were reviewed by me and considered in my medical decision making (see chart for details).    MDM Rules/Calculators/A&P                            87 year old female with a history of right hip bursitis, CAD, congestive heart failure, osteoarthritis, osteopenia, HTN, CKD stage III sacroiliitis who presents emergency department with a chief complaint of right leg pain, back pain, abdominal pain.  She was found to have a fever on arrival to the ED at 100.4.  No known history of fever prior to arrival in the ED.  Vital signs are otherwise unremarkable.  Labs and imaging of been reviewed and inability interpreted by me.  Patient was seen and independently evaluated by Dr. Betsey Holiday, attending physician.  Patient's pain was well controlled after being given morphine.  No improvement in her pain previously with fentanyl in the ED.  Differential diagnoses includes pyelonephritis, UTI, appendicitis, septic joint, occult fracture, diverticulitis, bowel obstruction,  mesenteric ischemia, AAA.  No leukocytosis.  Lactate is normal.  Creatinine is 1.79 with elevated BUN, but this appears unchanged from previous.  UA is pending.  CT is pending.  Patient care transferred to Dr. Lia Hopping at the end of my shift to follow up on CT and UA. Patient presentation, ED course, and plan of care discussed with review of all pertinent labs and imaging. Please see his/her [name] for further details regarding further ED course and disposition.   Final Clinical Impression(s) / ED Diagnoses Final diagnoses:  None    Rx / DC Orders ED Discharge Orders     None        Joanne Gavel, PA-C 09/21/20 0704    Orpah Greek, MD 09/21/20 [phone]

## 2020-09-21 NOTE — ED Notes (Signed)
Called lab regarding CMP and Lipase still being in process after being sent down 2 hours ago - lab stated it is running. I asked about a timeframe for results since we are waiting for CMP before the pt can go for her CT scan. Lab advised they could not give a timeframe of when it would result. RN and EDP notified.

## 2020-09-21 NOTE — Discharge Instructions (Addendum)
Please pick up the Percocets take them every 6 hours as needed for pain. Check temperature twice daily, if you develop a fever greater than 100.4 please return back to the hospital for additional evaluation. Try doing some stretching for the right hip pain when possible, I also advised her to talk about alternative living situations as we discussed. Follow-up with your orthopedic doctor or primary care doctor on Tuesday or Wednesday next week for reevaluation. You should be receiving a phone call from home health regarding physical therapy outpatient scheduling needs.  Please pick up on the call.

## 2020-09-21 NOTE — ED Triage Notes (Signed)
Pt states that for the past 4 days her leg has hurt from her knee to her back with shoot ing pains off and on. Pt states just got worse today.

## 2020-09-23 ENCOUNTER — Telehealth: Payer: Self-pay | Admitting: Orthopaedic Surgery

## 2020-09-23 DIAGNOSIS — J4520 Mild intermittent asthma, uncomplicated: Secondary | ICD-10-CM | POA: Diagnosis not present

## 2020-09-23 DIAGNOSIS — D649 Anemia, unspecified: Secondary | ICD-10-CM | POA: Diagnosis not present

## 2020-09-23 DIAGNOSIS — M8580 Other specified disorders of bone density and structure, unspecified site: Secondary | ICD-10-CM | POA: Diagnosis not present

## 2020-09-23 DIAGNOSIS — E039 Hypothyroidism, unspecified: Secondary | ICD-10-CM | POA: Diagnosis not present

## 2020-09-23 DIAGNOSIS — N184 Chronic kidney disease, stage 4 (severe): Secondary | ICD-10-CM | POA: Diagnosis not present

## 2020-09-23 DIAGNOSIS — I509 Heart failure, unspecified: Secondary | ICD-10-CM | POA: Diagnosis not present

## 2020-09-23 DIAGNOSIS — K219 Gastro-esophageal reflux disease without esophagitis: Secondary | ICD-10-CM | POA: Diagnosis not present

## 2020-09-23 DIAGNOSIS — I10 Essential (primary) hypertension: Secondary | ICD-10-CM | POA: Diagnosis not present

## 2020-09-23 DIAGNOSIS — E7800 Pure hypercholesterolemia, unspecified: Secondary | ICD-10-CM | POA: Diagnosis not present

## 2020-09-23 NOTE — Telephone Encounter (Signed)
Pt's daughter calling stating she is in a lot of pain from her leg. Pt originally had an appt for the 19th for a gel inj but got moved to an opening for tomorrow at 3:30pm to have leg checked out. The best call back number if needed is [phone].

## 2020-09-23 NOTE — Telephone Encounter (Signed)
Patients daughter aware of the below message

## 2020-09-24 ENCOUNTER — Ambulatory Visit: Payer: Medicare HMO | Admitting: Physician Assistant

## 2020-09-24 ENCOUNTER — Other Ambulatory Visit: Payer: Self-pay

## 2020-09-24 ENCOUNTER — Encounter: Payer: Self-pay | Admitting: Physician Assistant

## 2020-09-24 ENCOUNTER — Telehealth: Payer: Self-pay

## 2020-09-24 DIAGNOSIS — M7061 Trochanteric bursitis, right hip: Secondary | ICD-10-CM | POA: Diagnosis not present

## 2020-09-24 DIAGNOSIS — M1711 Unilateral primary osteoarthritis, right knee: Secondary | ICD-10-CM

## 2020-09-24 MED ORDER — LIDOCAINE HCL 1 % IJ SOLN
3.0000 mL | INTRAMUSCULAR | Status: AC | PRN
  Administered 2020-09-24: 3 mL

## 2020-09-24 MED ORDER — HYALURONAN 88 MG/4ML IX SOSY
88.0000 mg | PREFILLED_SYRINGE | INTRA_ARTICULAR | Status: AC | PRN
  Administered 2020-09-24: 88 mg via INTRA_ARTICULAR

## 2020-09-24 MED ORDER — OXYCODONE-ACETAMINOPHEN 5-325 MG PO TABS: 1.0000 | ORAL_TABLET | Freq: Four times a day (QID) | ORAL | 0 refills | Status: DC | PRN

## 2020-09-24 MED ORDER — METHYLPREDNISOLONE ACETATE 40 MG/ML IJ SUSP
40.0000 mg | INTRAMUSCULAR | Status: AC | PRN
Start: 2020-09-24 — End: 2020-09-24
  Administered 2020-09-24: 40 mg via INTRA_ARTICULAR

## 2020-09-24 NOTE — Telephone Encounter (Signed)
Approved for Monovisc, right knee. Buy & Bill Once OOP has been met, patient will be covered at 100%. Co-pay of $20.00 PA Approval# [id number] Valid 09/18/2020-12/17/2020 

## 2020-09-24 NOTE — Progress Notes (Signed)
Office Visit Note   Patient: NESHA COUNIHAN           Date of Birth: 1928-01-09           MRN: [id number] Visit Date: 09/24/2020              Requested by: Lawerance Cruel, [place],  [place] [address] PCP: Lawerance Cruel, MD   Assessment & Plan: Visit Diagnoses:  1. Unilateral primary osteoarthritis, right knee   2. Trochanteric bursitis of right hip     Plan: Postinjection and aspiration right knee along with injection of the right hip trochanteric bursa were tolerated well per the patient.  Post injections was able to move her hip around with greatly diminished pain.  She was able to ambulate on the leg with diminished pain.  We will see her back in 2 weeks to see how she is doing overall.  Refill on oxycodone was given she uses sparingly.  Questions were encouraged and answered both patient and her daughters present throughout the examination today.  Follow-Up Instructions: Return in about 2 weeks (around 10/08/2020).   Orders:  Orders Placed This Encounter  Procedures   Large Joint Inj   Large Joint Inj    Meds ordered this encounter  Medications   oxyCODONE-acetaminophen (PERCOCET/ROXICET) 5-325 MG tablet    Sig: Take 1 tablet by mouth every 6 (six) hours as needed for severe pain.    Dispense:  15 tablet    Refill:  0      Procedures: Large Joint Inj: R knee on 09/24/2020 4:19 PM Indications: pain Details: 22 G 1.5 in needle, anterolateral approach  Arthrogram: No  Medications: 88 mg Hyaluronan 88 MG/4ML; 3 mL lidocaine 1 % Aspirate: 15 mL yellow Outcome: tolerated well, no immediate complications Procedure, treatment alternatives, risks and benefits explained, specific risks discussed. Consent was given by the patient. Immediately prior to procedure a time out was called to verify the correct patient, procedure, equipment, support staff and site/side marked as required. Patient was prepped and draped in the usual sterile fashion.     Large Joint Inj: R greater trochanter on 09/24/2020 4:20 PM Indications: pain Details: 22 G 1.5 in needle, lateral approach  Arthrogram: No  Medications: 3 mL lidocaine 1 %; 40 mg methylPREDNISolone acetate 40 MG/ML Outcome: tolerated well, no immediate complications Procedure, treatment alternatives, risks and benefits explained, specific risks discussed. Consent was given by the patient. Immediately prior to procedure a time out was called to verify the correct patient, procedure, equipment, support staff and site/side marked as required. Patient was prepped and draped in the usual sterile fashion.      Clinical Data: No additional findings.   Subjective: Chief Complaint  Patient presents with   Right Leg - Pain    HPI Mrs. Goslin is a 87 year old female well-known to Dr. Creta Levin service comes in today with right leg pain.  Pain started this Saturday in the right thigh.  Pain does radiate down towards the foot.  No numbness tingling.  She has had no known injury.  She describes the pain as a sharp pain lateral aspect of the femur.  She has been going to physical therapy 2 times a week work on gait and balance but stopped this sometime in August.  She has difficulty bearing weight on the right side due to the pain.  Has to use a cane and cannot ambulate for any long distances.  Currently today she is  having no pain past the right knee.  No pain on the left side.  She is approved for a Monovisc injection right knee.  She has known right knee osteoarthritis.  Last Monovisc injection in the right knee was in 2019. She was seen in the ER on 09/21/2020 underwent a CT scan of her abdomen pelvis.  Images of the CT of the pelvis were reviewed and showed no acute findings.  Both hips well located.  Slight narrowing of the right hip compared to the left.  Review of Systems  Constitutional:  Negative for chills.  Musculoskeletal:  Positive for arthralgias.  Max temp reported 100.4 No recent  vaccines.  Objective: Vital Signs: There were no vitals taken for this visit.  Physical Exam Constitutional:      Appearance: She is not ill-appearing or diaphoretic.  Pulmonary:     Effort: Pulmonary effort is normal.  Neurological:     Mental Status: She is alert and oriented to person, place, and time.  Psychiatric:        Mood and Affect: Mood normal.    Ortho Exam Bilateral hips good range of motion however extremes of external rotation the right hip causes pain.  She has tenderness over the right trochanteric region and down the IT band.  Left knee good range of motion without pain.  Right knee she has tenderness on the medial joint line and a slight effusion no abnormal warmth erythema.  Right knee with no instability valgus varus stressing.  Positive straight leg raise on the right negative on the left. Specialty Comments:  No specialty comments available.  Imaging: No results found.   PMFS History: Patient Active Problem List   Diagnosis Date Noted   Non-ischemic cardiomyopathy ([place]) 03/18/2020   Atrial tachycardia ([place]) 03/18/2020   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 03/18/2020   Unilateral primary osteoarthritis, right knee 07/04/2017   Dyspnea    Chronic systolic heart failure (HCC)    Pain in left shin 02/07/2015   Leukocytosis 71/24/5809   Chronic systolic CHF (congestive heart failure), NYHA class 3 ([place]) 01/16/2015   Protein-calorie malnutrition, severe ([place]) 01/15/2015   Infection due to Corynebacterium diphtheriae 12/24/2014   Bacteremia    Fever 98/33/8250   Chronic systolic CHF (congestive heart failure), NYHA class 4 ([place]) 10/23/2014   Situational depression    Frequent headaches    Pulmonary embolism ([place]) 10/10/2014   Chronic anticoagulation 10/10/2014   Takotsubo syndrome 08/19/2014   HCAP (healthcare-associated pneumonia) 08/17/2014   H/O Asthma 08/16/2014   LBBB (left bundle branch block) 08/16/2014    Cardiomyopathy- etiology not yet determined 08/16/2014   PSVT (paroxysmal supraventricular tachycardia) ([place]) 08/16/2014   CKD (chronic kidney disease) stage 3, GFR 30-59 ml/min (HCC) 08/13/2014   Anemia of chronic disease 08/13/2014   Hypothyroidism 08/13/2014   Acute cholecystitis 08/11/2014   Abdominal pain 08/11/2014   Essential (primary) hypertension 04/02/2014   Past Medical History:  Diagnosis Date   Adult hypothyroidism 04/02/2014   Anemia of chronic disease 08/13/2014   Anxiety    Arthritis    Asthma    breast ca dx'd 2000   left   Bursitis    right hip   CAP (community acquired pneumonia) 12/19/2012   Chest pain    NM stress test 05/2011 Normal   CHF (congestive heart failure) ([place]) 08/19/2014   CKD (chronic kidney disease) stage 3, GFR 30-59 ml/min (HCC) 08/13/2014   Depression    DOE (dyspnea on exertion)  No SOB at rest   Essential (primary) hypertension 04/02/2014   Fatigue    excertional   [id number](102.7)    Hereditary and idiopathic peripheral neuropathy 07/02/2014   Hypercholesteremia    Intermittent LBBB (left bundle branch block) 12/21/2012   Leg weakness    Neuropathy    Obesity    Osteopenia    Peripheral neuropathy    Presence of permanent cardiac pacemaker 01/16/2015   BIV   Sacroiliitis, not elsewhere classified ([place])    Situational depression    Trigger finger    [name]    Family History  Problem Relation Age of Onset   Pneumonia Mother 20       cause of death   Diabetes Brother    Pancreatic cancer Father 73   Alzheimer's disease Sister    CVA Daughter        01/21/09   Stroke Daughter    Hypertension Daughter    Heart attack Neg Hx     Past Surgical History:  Procedure Laterality Date   ACHILLES TENDON SURGERY     BREAST LUMPECTOMY     breast cancer   CARDIAC CATHETERIZATION N/A 07/21/2015   Procedure: Right Heart Cath;  Surgeon: Jolaine Artist, MD;  Location: [facility];  Service: Cardiovascular;  Laterality:  N/A;   CHOLECYSTECTOMY N/A 08/13/2014   Procedure: LAPAROSCOPIC CHOLECYSTECTOMY;  Surgeon: Stark Klein, MD;  Location: [facility];  Service: General;  Laterality: N/A;   EP IMPLANTABLE DEVICE N/A 01/16/2015   Procedure: BiV Pacemaker Insertion CRT-P;  Surgeon: Deboraha Sprang, MD;  Location: [facility];  Service: Cardiovascular;  Laterality: N/A;   LEG SURGERY     LUMBAR LAMINECTOMY     POLYPECTOMY     TONSILLECTOMY     VESICOVAGINAL FISTULA CLOSURE W/ TAH     DC hysterectomy   Social History   Occupational History   Occupation: Retired  Tobacco Use   Smoking status: Never   Smokeless tobacco: Never  Vaping Use   Vaping Use: Never used  Substance and Sexual Activity   Alcohol use: No    Alcohol/week: 0.0 standard drinks   Drug use: No   Sexual activity: Never

## 2020-09-25 DIAGNOSIS — I5022 Chronic systolic (congestive) heart failure: Secondary | ICD-10-CM | POA: Diagnosis not present

## 2020-09-25 DIAGNOSIS — I130 Hypertensive heart and chronic kidney disease with heart failure and stage 1 through stage 4 chronic kidney disease, or unspecified chronic kidney disease: Secondary | ICD-10-CM | POA: Diagnosis not present

## 2020-09-25 DIAGNOSIS — F4323 Adjustment disorder with mixed anxiety and depressed mood: Secondary | ICD-10-CM | POA: Diagnosis not present

## 2020-09-25 DIAGNOSIS — M7071 Other bursitis of hip, right hip: Secondary | ICD-10-CM | POA: Diagnosis not present

## 2020-09-25 DIAGNOSIS — N183 Chronic kidney disease, stage 3 unspecified: Secondary | ICD-10-CM | POA: Diagnosis not present

## 2020-09-25 DIAGNOSIS — J45909 Unspecified asthma, uncomplicated: Secondary | ICD-10-CM | POA: Diagnosis not present

## 2020-09-25 DIAGNOSIS — I2510 Atherosclerotic heart disease of native coronary artery without angina pectoris: Secondary | ICD-10-CM | POA: Diagnosis not present

## 2020-09-25 DIAGNOSIS — M1711 Unilateral primary osteoarthritis, right knee: Secondary | ICD-10-CM | POA: Diagnosis not present

## 2020-09-25 DIAGNOSIS — D631 Anemia in chronic kidney disease: Secondary | ICD-10-CM | POA: Diagnosis not present

## 2020-09-25 NOTE — Progress Notes (Signed)
Remote pacemaker transmission.   

## 2020-09-26 LAB — CULTURE, BLOOD (ROUTINE X 2)
Culture: NO GROWTH
Culture: NO GROWTH
Special Requests: ADEQUATE
Special Requests: ADEQUATE

## 2020-09-29 ENCOUNTER — Ambulatory Visit (INDEPENDENT_AMBULATORY_CARE_PROVIDER_SITE_OTHER): Payer: Medicare HMO

## 2020-09-29 ENCOUNTER — Telehealth: Payer: Self-pay | Admitting: Orthopaedic Surgery

## 2020-09-29 DIAGNOSIS — I5022 Chronic systolic (congestive) heart failure: Secondary | ICD-10-CM

## 2020-09-29 DIAGNOSIS — Z950 Presence of cardiac pacemaker: Secondary | ICD-10-CM | POA: Diagnosis not present

## 2020-09-29 NOTE — Telephone Encounter (Signed)
Pt asking for a call back. Pt question is. Does it take awhile for gel injection to help with all pain. Pt is still experiencing some pain. Please call pt about this matter. [phone].

## 2020-09-29 NOTE — Telephone Encounter (Signed)
I told the pt to give the hip injection another week and the gel injection can take 6-8 weeks. She wanted to know what can be done for the pain. She doesn't want to keep taking her oxy. Please advise

## 2020-09-29 NOTE — Progress Notes (Signed)
EPIC Encounter for ICM Monitoring  Patient Name: Alexandra [name] is a 87 y.o. female Date: 09/29/2020 Primary Care Physican: Lawerance Cruel, MD Primary Cardiologist: Skains/Bensimhon Electrophysiologist: Vergie Living Pacing:  98%   06/20/2020 Weight: 165 lbs                                                                  Spoke with patient and heart failure questions reviewed.  Pt asymptomatic for fluid accumulation.  She reports severe right hip pain and family has been bringing her restaurant foods since she is unable to cook.  She is under physician care for hip pain.   Corvue Thoracic impedance suggesting possible fluid accumulation starting 9/3 but trending back toward baseline.    Prescribed:  Furosemide 40 mg Take one tablet (40 mg) by mouth once every other day. May take extra as needed.   Potassium 10 mEq 1 tablet daily.   LABS: 08/04/2018 Creatinine 1.79, BUN 34, Potassium 4.0, Sodium 137, GFR 25-29 03/10/2018 Creatinine 1.61, BUN 27, Potassium 4.3, Sodium 138, GFR 28-33 01/27/2018 Creatinine 1.55, BUN 27, Potassium 4.8, Sodium 141, GFR 30-34   Recommendations:   Advised to limit salt if possible.     Follow-up plan: ICM clinic phone appointment on 10/07/2020 to recheck fluid levels.  91 day device clinic remote transmission 12/15/2020.     EP/Cardiology Office Visits:  Recall 03/19/2021 with Dr Caryl Comes.  Recall 02/14/2021 with Dr Haroldine Laws   Copy of ICM check sent to Dr. Caryl Comes.    3 month ICM trend: 09/29/2020.    1 Year ICM trend:       [name], RN 09/29/2020 5:09 PM

## 2020-09-30 DIAGNOSIS — M7071 Other bursitis of hip, right hip: Secondary | ICD-10-CM | POA: Diagnosis not present

## 2020-09-30 DIAGNOSIS — N183 Chronic kidney disease, stage 3 unspecified: Secondary | ICD-10-CM | POA: Diagnosis not present

## 2020-09-30 DIAGNOSIS — J45909 Unspecified asthma, uncomplicated: Secondary | ICD-10-CM | POA: Diagnosis not present

## 2020-09-30 DIAGNOSIS — I5022 Chronic systolic (congestive) heart failure: Secondary | ICD-10-CM | POA: Diagnosis not present

## 2020-09-30 DIAGNOSIS — F4323 Adjustment disorder with mixed anxiety and depressed mood: Secondary | ICD-10-CM | POA: Diagnosis not present

## 2020-09-30 DIAGNOSIS — D631 Anemia in chronic kidney disease: Secondary | ICD-10-CM | POA: Diagnosis not present

## 2020-09-30 DIAGNOSIS — I2510 Atherosclerotic heart disease of native coronary artery without angina pectoris: Secondary | ICD-10-CM | POA: Diagnosis not present

## 2020-09-30 DIAGNOSIS — I130 Hypertensive heart and chronic kidney disease with heart failure and stage 1 through stage 4 chronic kidney disease, or unspecified chronic kidney disease: Secondary | ICD-10-CM | POA: Diagnosis not present

## 2020-09-30 DIAGNOSIS — M1711 Unilateral primary osteoarthritis, right knee: Secondary | ICD-10-CM | POA: Diagnosis not present

## 2020-10-01 NOTE — Telephone Encounter (Signed)
Pt is seeing gil on Monday and per gil he can discuss this with her then

## 2020-10-02 ENCOUNTER — Other Ambulatory Visit: Payer: Self-pay

## 2020-10-02 ENCOUNTER — Ambulatory Visit (INDEPENDENT_AMBULATORY_CARE_PROVIDER_SITE_OTHER): Payer: Medicare HMO | Admitting: Physician Assistant

## 2020-10-02 ENCOUNTER — Telehealth: Payer: Self-pay | Admitting: Orthopaedic Surgery

## 2020-10-02 ENCOUNTER — Ambulatory Visit (INDEPENDENT_AMBULATORY_CARE_PROVIDER_SITE_OTHER): Payer: Medicare HMO

## 2020-10-02 ENCOUNTER — Encounter: Payer: Self-pay | Admitting: Physician Assistant

## 2020-10-02 VITALS — BP 131/59 | HR 80

## 2020-10-02 DIAGNOSIS — I130 Hypertensive heart and chronic kidney disease with heart failure and stage 1 through stage 4 chronic kidney disease, or unspecified chronic kidney disease: Secondary | ICD-10-CM | POA: Diagnosis not present

## 2020-10-02 DIAGNOSIS — I5022 Chronic systolic (congestive) heart failure: Secondary | ICD-10-CM | POA: Diagnosis not present

## 2020-10-02 DIAGNOSIS — J45909 Unspecified asthma, uncomplicated: Secondary | ICD-10-CM | POA: Diagnosis not present

## 2020-10-02 DIAGNOSIS — N183 Chronic kidney disease, stage 3 unspecified: Secondary | ICD-10-CM | POA: Diagnosis not present

## 2020-10-02 DIAGNOSIS — M79604 Pain in right leg: Secondary | ICD-10-CM

## 2020-10-02 DIAGNOSIS — M1711 Unilateral primary osteoarthritis, right knee: Secondary | ICD-10-CM | POA: Diagnosis not present

## 2020-10-02 DIAGNOSIS — D631 Anemia in chronic kidney disease: Secondary | ICD-10-CM | POA: Diagnosis not present

## 2020-10-02 DIAGNOSIS — I2510 Atherosclerotic heart disease of native coronary artery without angina pectoris: Secondary | ICD-10-CM | POA: Diagnosis not present

## 2020-10-02 DIAGNOSIS — F4323 Adjustment disorder with mixed anxiety and depressed mood: Secondary | ICD-10-CM | POA: Diagnosis not present

## 2020-10-02 DIAGNOSIS — M7071 Other bursitis of hip, right hip: Secondary | ICD-10-CM | POA: Diagnosis not present

## 2020-10-02 MED ORDER — OXYCODONE-ACETAMINOPHEN 5-325 MG PO TABS: 1.0000 | ORAL_TABLET | Freq: Four times a day (QID) | ORAL | 0 refills | Status: DC | PRN

## 2020-10-02 NOTE — Telephone Encounter (Signed)
Pt daughter called to talk about pt pain. She wants a nurse to call her   CB [phone] -- patty

## 2020-10-02 NOTE — Progress Notes (Signed)
Office Visit Note   Patient: Alexandra [name]           Date of Birth: Dec 01, 1927           MRN: [id number] Visit Date: 10/02/2020              Requested by: Lawerance Cruel, [place],  [place] [address] PCP: Lawerance Cruel, MD   Assessment & Plan: Visit Diagnoses:  1. Pain in right leg     Plan:  Given patient's continued pain despite conservative treatment and given the arthritic changes of her back recommend CT myelogram to evaluate for HNP as a source of her right leg pain that is debilitating.  She is unable to have MRI due to the fact that she has a pacemaker.  She is on blood thinners (Eliquis).  Questions were encouraged and answered by Dr. Ninfa Linden myself.  She will follow-up with [place] after the CT myelogram to go over results discuss further treatment.  Refill of her Percocet was given today.  Follow-Up Instructions: Return After CT scan.   Orders:  Orders Placed This Encounter  Procedures   XR FEMUR, MIN 2 VIEWS RIGHT   Meds ordered this encounter  Medications   oxyCODONE-acetaminophen (PERCOCET/ROXICET) 5-325 MG tablet    Sig: Take 1 tablet by mouth every 6 (six) hours as needed for severe pain.    Dispense:  15 tablet    Refill:  0      Procedures: No procedures performed   Clinical Data: No additional findings.   Subjective: Chief Complaint  Patient presents with   Right Hip - Follow-up, Pain   Right Knee - Pain, Follow-up    HPI [name] returns today stating that her right hip and thigh pain has not improved.  She does state that her knee is somewhat improved.  She still having no numbness tingling down the leg.  She has had prior lumbar spine x-rays December 2020 which showed degenerative changes at multiple levels and significant osteophytes throughout the lumbar spine.  She has had no acute injury.  Right knee with moderate to severe osteoarthritis.  Both hips well located on the prior spine film.  She is taking  Percocet for pain and states that it really does not help much.  She is asking for blood pressure recheck today.  Review of Systems See HPI.  Objective: Vital Signs: BP (!) 131/59   Pulse 80   Physical Exam Constitutional:      Appearance: She is not ill-appearing or diaphoretic.  Neurological:     Mental Status: She is alert and oriented to person, place, and time.  Psychiatric:        Mood and Affect: Mood normal.    Ortho Exam Tenderness over the right hip trochanteric region and down the right lateral thigh. Specialty Comments:  No specialty comments available.  Imaging: XR FEMUR, MIN 2 VIEWS RIGHT  Result Date: 10/02/2020 Right femur 2 views: Hips well located and hip joint well-maintained.  Right knee well located with arthritic changes.  There is no evidence of acute fracture involving the right femur hip or knee.  No bony abnormalities otherwise.    PMFS History: Patient Active Problem List   Diagnosis Date Noted   Non-ischemic cardiomyopathy ([facility]) 03/18/2020   Atrial tachycardia ([facility]) 03/18/2020   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 03/18/2020   Unilateral primary osteoarthritis, right knee 07/04/2017   Dyspnea    Chronic systolic  heart failure (HCC)    Pain in left shin 02/07/2015   Leukocytosis 32/44/0102   Chronic systolic CHF (congestive heart failure), NYHA class 3 ([facility]) 01/16/2015   Protein-calorie malnutrition, severe ([facility]) 01/15/2015   Infection due to Corynebacterium diphtheriae 12/24/2014   Bacteremia    Fever 72/53/6644   Chronic systolic CHF (congestive heart failure), NYHA class 4 ([place]) 10/23/2014   Situational depression    Frequent headaches    Pulmonary embolism ([facility]) 10/10/2014   Chronic anticoagulation 10/10/2014   Takotsubo syndrome 08/19/2014   HCAP (healthcare-associated pneumonia) 08/17/2014   H/O Asthma 08/16/2014   LBBB (left bundle branch block) 08/16/2014   Cardiomyopathy- etiology not yet determined  08/16/2014   PSVT (paroxysmal supraventricular tachycardia) ([facility]) 08/16/2014   CKD (chronic kidney disease) stage 3, GFR 30-59 ml/min (HCC) 08/13/2014   Anemia of chronic disease 08/13/2014   Hypothyroidism 08/13/2014   Acute cholecystitis 08/11/2014   Abdominal pain 08/11/2014   Essential (primary) hypertension 04/02/2014   Past Medical History:  Diagnosis Date   Adult hypothyroidism 04/02/2014   Anemia of chronic disease 08/13/2014   Anxiety    Arthritis    Asthma    breast ca dx'd 2000   left   Bursitis    right hip   CAP (community acquired pneumonia) 12/19/2012   Chest pain    NM stress test 05/2011 Normal   CHF (congestive heart failure) ([facility]) 08/19/2014   CKD (chronic kidney disease) stage 3, GFR 30-59 ml/min (HCC) 08/13/2014   Depression    DOE (dyspnea on exertion)    No SOB at rest   Essential (primary) hypertension 04/02/2014   Fatigue    excertional   IHKVQQVZ(563.8)    Hereditary and idiopathic peripheral neuropathy 07/02/2014   Hypercholesteremia    Intermittent LBBB (left bundle branch block) 12/21/2012   Leg weakness    Neuropathy    Obesity    Osteopenia    Peripheral neuropathy    Presence of permanent cardiac pacemaker 01/16/2015   BIV   Sacroiliitis, not elsewhere classified ([facility])    Situational depression    Trigger finger    [name]    Family History  Problem Relation Age of Onset   Pneumonia Mother 77       cause of death   Diabetes Brother    Pancreatic cancer Father 109   Alzheimer's disease Sister    CVA Daughter        01/21/09   Stroke Daughter    Hypertension Daughter    Heart attack Neg Hx     Past Surgical History:  Procedure Laterality Date   ACHILLES TENDON SURGERY     BREAST LUMPECTOMY     breast cancer   CARDIAC CATHETERIZATION N/A 07/21/2015   Procedure: Right Heart Cath;  Surgeon: Jolaine Artist, MD;  Location: [facility];  Service: Cardiovascular;  Laterality: N/A;   CHOLECYSTECTOMY N/A 08/13/2014    Procedure: LAPAROSCOPIC CHOLECYSTECTOMY;  Surgeon: Stark Klein, MD;  Location: [facility];  Service: General;  Laterality: N/A;   EP IMPLANTABLE DEVICE N/A 01/16/2015   Procedure: BiV Pacemaker Insertion CRT-P;  Surgeon: Deboraha Sprang, MD;  Location: [facility];  Service: Cardiovascular;  Laterality: N/A;   LEG SURGERY     LUMBAR LAMINECTOMY     POLYPECTOMY     TONSILLECTOMY     VESICOVAGINAL FISTULA CLOSURE W/ TAH     DC hysterectomy   Social History   Occupational History   Occupation: Retired  Tobacco  Use   Smoking status: Never   Smokeless tobacco: Never  Vaping Use   Vaping Use: Never used  Substance and Sexual Activity   Alcohol use: No    Alcohol/week: 0.0 standard drinks   Drug use: No   Sexual activity: Never

## 2020-10-02 NOTE — Telephone Encounter (Signed)
Can we just have her come in today to be seen?

## 2020-10-02 NOTE — Telephone Encounter (Signed)
I talked to the daughter and pt is able to come in. I added her to the schedule

## 2020-10-02 NOTE — Telephone Encounter (Signed)
I called and talked to daughter. She will try to find a way to get her in.

## 2020-10-03 NOTE — Addendum Note (Signed)
Addended by: Robyne Peers on: 10/03/2020 11:18 AM   Modules accepted: Orders

## 2020-10-06 ENCOUNTER — Ambulatory Visit: Payer: Medicare HMO | Admitting: Orthopaedic Surgery

## 2020-10-07 ENCOUNTER — Ambulatory Visit (INDEPENDENT_AMBULATORY_CARE_PROVIDER_SITE_OTHER): Payer: Medicare HMO

## 2020-10-07 ENCOUNTER — Telehealth: Payer: Self-pay | Admitting: Orthopaedic Surgery

## 2020-10-07 DIAGNOSIS — J45909 Unspecified asthma, uncomplicated: Secondary | ICD-10-CM | POA: Diagnosis not present

## 2020-10-07 DIAGNOSIS — Z950 Presence of cardiac pacemaker: Secondary | ICD-10-CM

## 2020-10-07 DIAGNOSIS — I130 Hypertensive heart and chronic kidney disease with heart failure and stage 1 through stage 4 chronic kidney disease, or unspecified chronic kidney disease: Secondary | ICD-10-CM | POA: Diagnosis not present

## 2020-10-07 DIAGNOSIS — F4323 Adjustment disorder with mixed anxiety and depressed mood: Secondary | ICD-10-CM | POA: Diagnosis not present

## 2020-10-07 DIAGNOSIS — M7071 Other bursitis of hip, right hip: Secondary | ICD-10-CM | POA: Diagnosis not present

## 2020-10-07 DIAGNOSIS — M1711 Unilateral primary osteoarthritis, right knee: Secondary | ICD-10-CM | POA: Diagnosis not present

## 2020-10-07 DIAGNOSIS — I5022 Chronic systolic (congestive) heart failure: Secondary | ICD-10-CM

## 2020-10-07 DIAGNOSIS — N183 Chronic kidney disease, stage 3 unspecified: Secondary | ICD-10-CM | POA: Diagnosis not present

## 2020-10-07 DIAGNOSIS — I2510 Atherosclerotic heart disease of native coronary artery without angina pectoris: Secondary | ICD-10-CM | POA: Diagnosis not present

## 2020-10-07 DIAGNOSIS — D631 Anemia in chronic kidney disease: Secondary | ICD-10-CM | POA: Diagnosis not present

## 2020-10-07 NOTE — Telephone Encounter (Signed)
Called patient left message on voicemail to return call to schedule an appointment for MRI review With Artis Delay or Dr Ninfa Linden

## 2020-10-08 ENCOUNTER — Telehealth: Payer: Self-pay | Admitting: Physician Assistant

## 2020-10-08 NOTE — Progress Notes (Signed)
EPIC Encounter for ICM Monitoring  Patient Name: Alexandra [name] is a 87 y.o. female Date: 10/08/2020 Primary Care Physican: Lawerance Cruel, MD Primary Cardiologist: Skains/Bensimhon Electrophysiologist: Vergie Living Pacing:  98%   06/20/2020 Weight: 165 lbs                                                                  Spoke with patient and heart failure questions reviewed.  Pt asymptomatic for fluid accumulation.  She reports severe right hip pain and has CT scan tomorrow.   Corvue Thoracic impedance suggesting fluid levels retuned to normal.    Prescribed:  Furosemide 40 mg Take one tablet (40 mg) by mouth once every other day. May take extra as needed.   Potassium 10 mEq 1 tablet daily.   LABS: 08/04/2018 Creatinine 1.79, BUN 34, Potassium 4.0, Sodium 137, GFR 25-29 03/10/2018 Creatinine 1.61, BUN 27, Potassium 4.3, Sodium 138, GFR 28-33 01/27/2018 Creatinine 1.55, BUN 27, Potassium 4.8, Sodium 141, GFR 30-34   Recommendations:   No changes and encouraged to call if experiencing any fluid symptoms.   Follow-up plan: ICM clinic phone appointment on 11/10/2020.  91 day device clinic remote transmission 12/15/2020.     EP/Cardiology Office Visits:  Recall 03/19/2021 with Dr Caryl Comes.  Recall 02/14/2021 with Dr Haroldine Laws   Copy of ICM check sent to Dr. Caryl Comes.     3 month ICM trend: 10/07/2020.    1 Year ICM trend:       [name], RN 10/08/2020 1:35 PM

## 2020-10-08 NOTE — Telephone Encounter (Signed)
Pt called stating she has an appt for a CT on 10/09/20 and the next appt isn't until 10/20/20; so she is wanting to know if she could be called with the results?  [phone]

## 2020-10-09 ENCOUNTER — Ambulatory Visit
Admission: RE | Admit: 2020-10-09 | Discharge: 2020-10-09 | Disposition: A | Discharge status: Home / Self Care | Payer: Medicare HMO | Source: Ambulatory Visit | Attending: Physician Assistant | Admitting: Physician Assistant

## 2020-10-09 ENCOUNTER — Ambulatory Visit: Payer: Medicare HMO | Admitting: Physician Assistant

## 2020-10-09 ENCOUNTER — Other Ambulatory Visit: Payer: Self-pay

## 2020-10-09 DIAGNOSIS — M5126 Other intervertebral disc displacement, lumbar region: Secondary | ICD-10-CM | POA: Diagnosis not present

## 2020-10-09 DIAGNOSIS — M2578 Osteophyte, vertebrae: Secondary | ICD-10-CM | POA: Diagnosis not present

## 2020-10-09 DIAGNOSIS — M79604 Pain in right leg: Secondary | ICD-10-CM

## 2020-10-09 MED ORDER — IOPAMIDOL (ISOVUE-M 200) INJECTION 41%
15.0000 mL | Freq: Once | INTRAMUSCULAR | Status: AC
  Administered 2020-10-09: 15 mL via INTRATHECAL

## 2020-10-09 MED ORDER — ONDANSETRON HCL 4 MG/2ML IJ SOLN: 4.0000 mg | Freq: Once | INTRAMUSCULAR | Status: DC | PRN

## 2020-10-09 MED ORDER — DIAZEPAM 5 MG PO TABS
5.0000 mg | ORAL_TABLET | Freq: Once | ORAL | Status: AC
  Administered 2020-10-09: 5 mg via ORAL

## 2020-10-09 MED ORDER — MEPERIDINE HCL 50 MG/ML IJ SOLN: 50.0000 mg | Freq: Once | INTRAMUSCULAR | Status: DC | PRN

## 2020-10-09 NOTE — Discharge Instructions (Signed)

## 2020-10-10 ENCOUNTER — Other Ambulatory Visit: Payer: Self-pay | Admitting: Orthopaedic Surgery

## 2020-10-10 ENCOUNTER — Other Ambulatory Visit: Payer: Self-pay

## 2020-10-10 ENCOUNTER — Telehealth: Payer: Self-pay | Admitting: Orthopaedic Surgery

## 2020-10-10 DIAGNOSIS — I5022 Chronic systolic (congestive) heart failure: Secondary | ICD-10-CM | POA: Diagnosis not present

## 2020-10-10 DIAGNOSIS — J45909 Unspecified asthma, uncomplicated: Secondary | ICD-10-CM | POA: Diagnosis not present

## 2020-10-10 DIAGNOSIS — N183 Chronic kidney disease, stage 3 unspecified: Secondary | ICD-10-CM | POA: Diagnosis not present

## 2020-10-10 DIAGNOSIS — M5442 Lumbago with sciatica, left side: Secondary | ICD-10-CM

## 2020-10-10 DIAGNOSIS — D631 Anemia in chronic kidney disease: Secondary | ICD-10-CM | POA: Diagnosis not present

## 2020-10-10 DIAGNOSIS — M7071 Other bursitis of hip, right hip: Secondary | ICD-10-CM | POA: Diagnosis not present

## 2020-10-10 DIAGNOSIS — M5441 Lumbago with sciatica, right side: Secondary | ICD-10-CM

## 2020-10-10 DIAGNOSIS — F4323 Adjustment disorder with mixed anxiety and depressed mood: Secondary | ICD-10-CM | POA: Diagnosis not present

## 2020-10-10 DIAGNOSIS — M1711 Unilateral primary osteoarthritis, right knee: Secondary | ICD-10-CM | POA: Diagnosis not present

## 2020-10-10 DIAGNOSIS — I130 Hypertensive heart and chronic kidney disease with heart failure and stage 1 through stage 4 chronic kidney disease, or unspecified chronic kidney disease: Secondary | ICD-10-CM | POA: Diagnosis not present

## 2020-10-10 DIAGNOSIS — I2510 Atherosclerotic heart disease of native coronary artery without angina pectoris: Secondary | ICD-10-CM | POA: Diagnosis not present

## 2020-10-10 MED ORDER — OXYCODONE-ACETAMINOPHEN 5-325 MG PO TABS: 1.0000 | ORAL_TABLET | Freq: Four times a day (QID) | ORAL | 0 refills | Status: DC | PRN

## 2020-10-10 NOTE — Telephone Encounter (Signed)
Patient daughter aware Rx was called in and that we have sent order for [facility]

## 2020-10-10 NOTE — Telephone Encounter (Signed)
[name] pt daughter called and needs to speak about her medications. Also pt would like to speak with court or shena about getting an injection for pt.   CB [phone]

## 2020-10-10 NOTE — Telephone Encounter (Signed)
Patient's daughter called. She would like a call back. [phone]. Has questions about mom's pain management.

## 2020-10-13 DIAGNOSIS — I5022 Chronic systolic (congestive) heart failure: Secondary | ICD-10-CM | POA: Diagnosis not present

## 2020-10-13 DIAGNOSIS — J45909 Unspecified asthma, uncomplicated: Secondary | ICD-10-CM | POA: Diagnosis not present

## 2020-10-13 DIAGNOSIS — M7071 Other bursitis of hip, right hip: Secondary | ICD-10-CM | POA: Diagnosis not present

## 2020-10-13 DIAGNOSIS — F4323 Adjustment disorder with mixed anxiety and depressed mood: Secondary | ICD-10-CM | POA: Diagnosis not present

## 2020-10-13 DIAGNOSIS — M1711 Unilateral primary osteoarthritis, right knee: Secondary | ICD-10-CM | POA: Diagnosis not present

## 2020-10-13 DIAGNOSIS — I2510 Atherosclerotic heart disease of native coronary artery without angina pectoris: Secondary | ICD-10-CM | POA: Diagnosis not present

## 2020-10-13 DIAGNOSIS — I130 Hypertensive heart and chronic kidney disease with heart failure and stage 1 through stage 4 chronic kidney disease, or unspecified chronic kidney disease: Secondary | ICD-10-CM | POA: Diagnosis not present

## 2020-10-13 DIAGNOSIS — N183 Chronic kidney disease, stage 3 unspecified: Secondary | ICD-10-CM | POA: Diagnosis not present

## 2020-10-13 DIAGNOSIS — D631 Anemia in chronic kidney disease: Secondary | ICD-10-CM | POA: Diagnosis not present

## 2020-10-14 DIAGNOSIS — F4323 Adjustment disorder with mixed anxiety and depressed mood: Secondary | ICD-10-CM | POA: Diagnosis not present

## 2020-10-14 DIAGNOSIS — I5022 Chronic systolic (congestive) heart failure: Secondary | ICD-10-CM | POA: Diagnosis not present

## 2020-10-14 DIAGNOSIS — M7071 Other bursitis of hip, right hip: Secondary | ICD-10-CM | POA: Diagnosis not present

## 2020-10-14 DIAGNOSIS — D631 Anemia in chronic kidney disease: Secondary | ICD-10-CM | POA: Diagnosis not present

## 2020-10-14 DIAGNOSIS — J45909 Unspecified asthma, uncomplicated: Secondary | ICD-10-CM | POA: Diagnosis not present

## 2020-10-14 DIAGNOSIS — I2510 Atherosclerotic heart disease of native coronary artery without angina pectoris: Secondary | ICD-10-CM | POA: Diagnosis not present

## 2020-10-14 DIAGNOSIS — I130 Hypertensive heart and chronic kidney disease with heart failure and stage 1 through stage 4 chronic kidney disease, or unspecified chronic kidney disease: Secondary | ICD-10-CM | POA: Diagnosis not present

## 2020-10-14 DIAGNOSIS — N183 Chronic kidney disease, stage 3 unspecified: Secondary | ICD-10-CM | POA: Diagnosis not present

## 2020-10-14 DIAGNOSIS — M1711 Unilateral primary osteoarthritis, right knee: Secondary | ICD-10-CM | POA: Diagnosis not present

## 2020-10-15 ENCOUNTER — Telehealth: Payer: Self-pay | Admitting: Physical Medicine and Rehabilitation

## 2020-10-15 DIAGNOSIS — I130 Hypertensive heart and chronic kidney disease with heart failure and stage 1 through stage 4 chronic kidney disease, or unspecified chronic kidney disease: Secondary | ICD-10-CM | POA: Diagnosis not present

## 2020-10-15 DIAGNOSIS — M7071 Other bursitis of hip, right hip: Secondary | ICD-10-CM | POA: Diagnosis not present

## 2020-10-15 DIAGNOSIS — D631 Anemia in chronic kidney disease: Secondary | ICD-10-CM | POA: Diagnosis not present

## 2020-10-15 DIAGNOSIS — J45909 Unspecified asthma, uncomplicated: Secondary | ICD-10-CM | POA: Diagnosis not present

## 2020-10-15 DIAGNOSIS — N183 Chronic kidney disease, stage 3 unspecified: Secondary | ICD-10-CM | POA: Diagnosis not present

## 2020-10-15 DIAGNOSIS — M1711 Unilateral primary osteoarthritis, right knee: Secondary | ICD-10-CM | POA: Diagnosis not present

## 2020-10-15 DIAGNOSIS — F4323 Adjustment disorder with mixed anxiety and depressed mood: Secondary | ICD-10-CM | POA: Diagnosis not present

## 2020-10-15 DIAGNOSIS — I5022 Chronic systolic (congestive) heart failure: Secondary | ICD-10-CM | POA: Diagnosis not present

## 2020-10-15 DIAGNOSIS — I2510 Atherosclerotic heart disease of native coronary artery without angina pectoris: Secondary | ICD-10-CM | POA: Diagnosis not present

## 2020-10-15 NOTE — Telephone Encounter (Signed)
Patient called. She would like to know if she should stop her blood thinners for the injection. Her call back number is [phone]

## 2020-10-16 ENCOUNTER — Telehealth: Payer: Self-pay | Admitting: Physical Medicine and Rehabilitation

## 2020-10-16 NOTE — Telephone Encounter (Signed)
Pt called and states she is on blood thinners. Should she keep taking them before inection?   Cb [phone]

## 2020-10-16 NOTE — Telephone Encounter (Signed)
Called patient again to advise that she should continue her medications.

## 2020-10-16 NOTE — Telephone Encounter (Signed)
Left message to advise that she will not need to hold blood thinners for a TF ESI.

## 2020-10-20 ENCOUNTER — Telehealth: Payer: Self-pay

## 2020-10-20 ENCOUNTER — Telehealth: Payer: Self-pay | Admitting: Orthopaedic Surgery

## 2020-10-20 NOTE — Telephone Encounter (Signed)
Patient called advised she has been taking Percocet since 09/21/2020 and want to know how to ween herself off of the medication. Patient said she has an appointment tomorrow with Dr. Ernestina Patches.   The number to contact patient is [phone] or [phone]

## 2020-10-20 NOTE — Telephone Encounter (Signed)
Patient aware of the below message  

## 2020-10-20 NOTE — Telephone Encounter (Signed)
Spoke with patient.  She is concerned that she is not sleeping in the same room with device monitor.  She has been sleeping in a recliner due to back pain and unable to sleep in the bedroom which is were the monitor is located. Advised reviewed the device monitor website and it is showing the monitor is connected and updating so she can leave in the bedroom for now.  Advised next remote transmission is scheduled for 10/24 and if remote transmission is not received will call her.

## 2020-10-20 NOTE — Telephone Encounter (Signed)
The patient left a message on voicemail for Sharman Cheek to give her a phone call.

## 2020-10-21 ENCOUNTER — Encounter: Payer: Self-pay | Admitting: Physical Medicine and Rehabilitation

## 2020-10-21 ENCOUNTER — Other Ambulatory Visit: Payer: Self-pay

## 2020-10-21 ENCOUNTER — Ambulatory Visit: Payer: Medicare HMO | Admitting: Physical Medicine and Rehabilitation

## 2020-10-21 ENCOUNTER — Ambulatory Visit: Payer: Self-pay

## 2020-10-21 VITALS — BP 115/69 | HR 102

## 2020-10-21 DIAGNOSIS — M5416 Radiculopathy, lumbar region: Secondary | ICD-10-CM | POA: Diagnosis not present

## 2020-10-21 DIAGNOSIS — M961 Postlaminectomy syndrome, not elsewhere classified: Secondary | ICD-10-CM

## 2020-10-21 DIAGNOSIS — M419 Scoliosis, unspecified: Secondary | ICD-10-CM

## 2020-10-21 MED ORDER — METHYLPREDNISOLONE ACETATE 80 MG/ML IJ SUSP
80.0000 mg | Freq: Once | INTRAMUSCULAR | Status: AC
  Administered 2020-10-21: 80 mg

## 2020-10-21 NOTE — Progress Notes (Signed)
Pt state lower back pain tha travels down both legs. Pt state walking, standing and laying down makes the pain worse. Pt state she takes pain meds to help ease her pain.  Numeric Pain Rating Scale and Functional Assessment Average Pain 8   In the last MONTH (on 0-10 scale) has pain interfered with the following?  1. General activity like being  able to carry out your everyday physical activities such as walking, climbing stairs, carrying groceries, or moving a chair?  Rating(10)   +Driver, -BT, -Dye Allergies.

## 2020-10-21 NOTE — Patient Instructions (Signed)

## 2020-10-24 DIAGNOSIS — F4323 Adjustment disorder with mixed anxiety and depressed mood: Secondary | ICD-10-CM | POA: Diagnosis not present

## 2020-10-24 DIAGNOSIS — I130 Hypertensive heart and chronic kidney disease with heart failure and stage 1 through stage 4 chronic kidney disease, or unspecified chronic kidney disease: Secondary | ICD-10-CM | POA: Diagnosis not present

## 2020-10-24 DIAGNOSIS — M1711 Unilateral primary osteoarthritis, right knee: Secondary | ICD-10-CM | POA: Diagnosis not present

## 2020-10-24 DIAGNOSIS — N183 Chronic kidney disease, stage 3 unspecified: Secondary | ICD-10-CM | POA: Diagnosis not present

## 2020-10-24 DIAGNOSIS — M7071 Other bursitis of hip, right hip: Secondary | ICD-10-CM | POA: Diagnosis not present

## 2020-10-24 DIAGNOSIS — J45909 Unspecified asthma, uncomplicated: Secondary | ICD-10-CM | POA: Diagnosis not present

## 2020-10-24 DIAGNOSIS — I2510 Atherosclerotic heart disease of native coronary artery without angina pectoris: Secondary | ICD-10-CM | POA: Diagnosis not present

## 2020-10-24 DIAGNOSIS — D631 Anemia in chronic kidney disease: Secondary | ICD-10-CM | POA: Diagnosis not present

## 2020-10-24 DIAGNOSIS — I5022 Chronic systolic (congestive) heart failure: Secondary | ICD-10-CM | POA: Diagnosis not present

## 2020-10-28 DIAGNOSIS — F4323 Adjustment disorder with mixed anxiety and depressed mood: Secondary | ICD-10-CM | POA: Diagnosis not present

## 2020-10-28 DIAGNOSIS — N183 Chronic kidney disease, stage 3 unspecified: Secondary | ICD-10-CM | POA: Diagnosis not present

## 2020-10-28 DIAGNOSIS — J45909 Unspecified asthma, uncomplicated: Secondary | ICD-10-CM | POA: Diagnosis not present

## 2020-10-28 DIAGNOSIS — D631 Anemia in chronic kidney disease: Secondary | ICD-10-CM | POA: Diagnosis not present

## 2020-10-28 DIAGNOSIS — I2510 Atherosclerotic heart disease of native coronary artery without angina pectoris: Secondary | ICD-10-CM | POA: Diagnosis not present

## 2020-10-28 DIAGNOSIS — I5022 Chronic systolic (congestive) heart failure: Secondary | ICD-10-CM | POA: Diagnosis not present

## 2020-10-28 DIAGNOSIS — I130 Hypertensive heart and chronic kidney disease with heart failure and stage 1 through stage 4 chronic kidney disease, or unspecified chronic kidney disease: Secondary | ICD-10-CM | POA: Diagnosis not present

## 2020-10-28 DIAGNOSIS — M7071 Other bursitis of hip, right hip: Secondary | ICD-10-CM | POA: Diagnosis not present

## 2020-10-28 DIAGNOSIS — M1711 Unilateral primary osteoarthritis, right knee: Secondary | ICD-10-CM | POA: Diagnosis not present

## 2020-10-28 NOTE — Procedures (Signed)
Lumbosacral Transforaminal Epidural Steroid Injection - Sub-Pedicular Approach with Fluoroscopic Guidance  Patient: Alexandra [name]      Date of Birth: 01-04-1928 MRN: [id number] PCP: Lawerance Cruel, MD      Visit Date: 10/21/2020   Universal Protocol:    Date/Time: 10/21/2020  Consent Given By: the patient  Position: PRONE  Additional Comments: Vital signs were monitored before and after the procedure. Patient was prepped and draped in the usual sterile fashion. The correct patient, procedure, and site was verified.   Injection Procedure Details:   Procedure diagnoses: Lumbar radiculopathy [M54.16]    Meds Administered:  Meds ordered this encounter  Medications   methylPREDNISolone acetate (DEPO-MEDROL) injection 80 mg    Laterality: Right  Location/Site: L4  Needle:5.0 in., 22 ga.  Short bevel or Quincke spinal needle  Needle Placement: Transforaminal  Findings:    -Comments: Excellent flow of contrast along the nerve, nerve root and into the epidural space.  Procedure Details: After squaring off the end-plates to get a true AP view, the C-arm was positioned so that an oblique view of the foramen as noted above was visualized. The target area is just inferior to the "nose of the scotty dog" or sub pedicular. The soft tissues overlying this structure were infiltrated with 2-3 ml. of 1% Lidocaine without Epinephrine.  The spinal needle was inserted toward the target using a "trajectory" view along the fluoroscope beam.  Under AP and lateral visualization, the needle was advanced so it did not puncture dura and was located close the 6 O'Clock position of the pedical in AP tracterory. Biplanar projections were used to confirm position. Aspiration was confirmed to be negative for CSF and/or blood. A 1-2 ml. volume of Isovue-250 was injected and flow of contrast was noted at each level. Radiographs were obtained for documentation purposes.   After attaining the desired  flow of contrast documented above, a 0.5 to 1.0 ml test dose of 0.25% Marcaine was injected into each respective transforaminal space.  The patient was observed for 90 seconds post injection.  After no sensory deficits were reported, and normal lower extremity motor function was noted,   the above injectate was administered so that equal amounts of the injectate were placed at each foramen (level) into the transforaminal epidural space.   Additional Comments:  The patient tolerated the procedure well Dressing: 2 x 2 sterile gauze and Band-Aid    Post-procedure details: Patient was observed during the procedure. Post-procedure instructions were reviewed.  Patient left the clinic in stable condition.

## 2020-10-28 NOTE — Progress Notes (Signed)
Alexandra [name] - 87 y.o. female MRN [id number]  Date of birth: [birth year]  Office Visit Note: Visit Date: 10/21/2020 PCP: Lawerance Cruel, MD Referred by: Lawerance Cruel, MD  Subjective: Chief Complaint  Patient presents with   Lower Back - Pain   Left Leg - Pain   Right Leg - Pain   HPI:  Alexandra [name] is a 87 y.o. female who comes in today at the request of Dr. Jean Rosenthal for planned Right L4-5 Lumbar Transforaminal epidural steroid injection with fluoroscopic guidance.  The patient has failed conservative care including home exercise, medications, time and activity modification.  This injection will be diagnostic and hopefully therapeutic.  Please see requesting physician notes for further details and justification.   ROS Otherwise per HPI.  Assessment & Plan: Visit Diagnoses:    ICD-10-CM   1. Lumbar radiculopathy  M54.16 XR C-ARM NO REPORT    Epidural Steroid injection    methylPREDNISolone acetate (DEPO-MEDROL) injection 80 mg    2. Post laminectomy syndrome  M96.1 XR C-ARM NO REPORT    Epidural Steroid injection    methylPREDNISolone acetate (DEPO-MEDROL) injection 80 mg    3. Scoliosis of lumbar spine, unspecified scoliosis type  M41.9       Plan: No additional findings.   Meds & Orders:  Meds ordered this encounter  Medications   methylPREDNISolone acetate (DEPO-MEDROL) injection 80 mg    Orders Placed This Encounter  Procedures   XR C-ARM NO REPORT   Epidural Steroid injection    Follow-up: Return if symptoms worsen or fail to improve.   Procedures: No procedures performed  Lumbosacral Transforaminal Epidural Steroid Injection - Sub-Pedicular Approach with Fluoroscopic Guidance  Patient: Alexandra [name]      Date of Birth: 05/15/1927 MRN: [id number] PCP: Lawerance Cruel, MD      Visit Date: 10/21/2020   Universal Protocol:    Date/Time: 10/21/2020  Consent Given By: the patient  Position: PRONE  Additional  Comments: Vital signs were monitored before and after the procedure. Patient was prepped and draped in the usual sterile fashion. The correct patient, procedure, and site was verified.   Injection Procedure Details:   Procedure diagnoses: Lumbar radiculopathy [M54.16]    Meds Administered:  Meds ordered this encounter  Medications   methylPREDNISolone acetate (DEPO-MEDROL) injection 80 mg    Laterality: Right  Location/Site: L4  Needle:5.0 in., 22 ga.  Short bevel or Quincke spinal needle  Needle Placement: Transforaminal  Findings:    -Comments: Excellent flow of contrast along the nerve, nerve root and into the epidural space.  Procedure Details: After squaring off the end-plates to get a true AP view, the C-arm was positioned so that an oblique view of the foramen as noted above was visualized. The target area is just inferior to the "nose of the scotty dog" or sub pedicular. The soft tissues overlying this structure were infiltrated with 2-3 ml. of 1% Lidocaine without Epinephrine.  The spinal needle was inserted toward the target using a "trajectory" view along the fluoroscope beam.  Under AP and lateral visualization, the needle was advanced so it did not puncture dura and was located close the 6 O'Clock position of the pedical in AP tracterory. Biplanar projections were used to confirm position. Aspiration was confirmed to be negative for CSF and/or blood. A 1-2 ml. volume of Isovue-250 was injected and flow of contrast was noted at each level. Radiographs were obtained for documentation purposes.   After attaining  the desired flow of contrast documented above, a 0.5 to 1.0 ml test dose of 0.25% Marcaine was injected into each respective transforaminal space.  The patient was observed for 90 seconds post injection.  After no sensory deficits were reported, and normal lower extremity motor function was noted,   the above injectate was administered so that equal amounts of the  injectate were placed at each foramen (level) into the transforaminal epidural space.   Additional Comments:  The patient tolerated the procedure well Dressing: 2 x 2 sterile gauze and Band-Aid    Post-procedure details: Patient was observed during the procedure. Post-procedure instructions were reviewed.  Patient left the clinic in stable condition.    Clinical History: CT LUMBAR MYELOGRAM FINDINGS:   The patient was scanned both prone and supine.   T11-12: Minimal disc bulge. Mild facet osteoarthritis. No stenosis.   T12-L1: Minimal disc bulge. Mild facet osteoarthritis. No stenosis.   L1-2: Disc space narrowing. Endplate osteophytes and bulging of the disc. No compressive stenosis.   L2-3: Bulging of the disc. Mild facet and ligamentous hypertrophy. Right posterolateral extraforaminal osteophyte formation with some potential to affect the right L2 nerve in the extraforaminal location.   L3-4: Chronic superior endplate deformity at L4. This does not represent a recent fracture. Discogenic endplate changes on the right at both L3 and L4. Endplate osteophytes and bulging of the disc more prominent towards the right. Right foraminal stenosis that likely compresses the right L3 nerve. Good posterior decompression with wide patency of the central canal. Mild left foraminal narrowing.   L4-5: Previous posterior decompression with good patency of the central canal. Endplate osteophytes and protruding disc material more prominent towards the right. Bilateral facet hypertrophy. Right foraminal stenosis likely to compress the right L4 nerve. Mild narrowing of the foramen on the left.   L5-S1: Mild bulging of the disc. Mild facet degeneration and hypertrophy. No compressive stenosis.   IMPRESSION: The patient has had previous posterior decompression at L3-4 and L4-5. There is wide patency of the central canal at those 2 levels and elsewhere throughout the lower thoracic and  lumbar region. There is, however, severe right foraminal encroachment at L3-4 and L4-5 due to osteophyte and disc material which would seem likely to compress the right L3 and L4 nerves.   At L2-3, there is a right extraforaminal osteophyte which would have some potential to affect the right L2 nerve in an extraforaminal location.   Non-compressive degenerative changes at the other levels as outlined above.     Electronically Signed   By: Nelson Chimes M.D.   On: 10/09/2020 10:33     Objective:  VS:  HT:    WT:   BMI:     BP:115/69  HR:(!) 102bpm  TEMP: ( )  RESP:  Physical Exam Vitals and nursing note reviewed.  Constitutional:      General: She is not in acute distress.    Appearance: Normal appearance. She is not ill-appearing.  HENT:     Head: Normocephalic and atraumatic.     Right Ear: External ear normal.     Left Ear: External ear normal.  Eyes:     Extraocular Movements: Extraocular movements intact.  Cardiovascular:     Rate and Rhythm: Normal rate.     Pulses: Normal pulses.  Pulmonary:     Effort: Pulmonary effort is normal. No respiratory distress.  Abdominal:     General: There is no distension.     Palpations: Abdomen is soft.  Musculoskeletal:        General: Tenderness present.     Cervical back: Neck supple.     Right lower leg: No edema.     Left lower leg: No edema.     Comments: Patient has good distal strength with no pain over the greater trochanters.  No clonus or focal weakness.  Skin:    Findings: No erythema, lesion or rash.  Neurological:     General: No focal deficit present.     Mental Status: She is alert and oriented to person, place, and time.     Sensory: No sensory deficit.     Motor: No weakness or abnormal muscle tone.     Coordination: Coordination normal.  Psychiatric:        Mood and Affect: Mood normal.        Behavior: Behavior normal.     Imaging: No results found.

## 2020-10-29 ENCOUNTER — Telehealth: Payer: Self-pay

## 2020-10-29 DIAGNOSIS — M5441 Lumbago with sciatica, right side: Secondary | ICD-10-CM

## 2020-10-29 NOTE — Telephone Encounter (Signed)
Pt called in stating that her injection hasn't helped with the pain. She would like a call regarding what is the next steps. Please advise

## 2020-10-30 NOTE — Telephone Encounter (Signed)
Left L4 TF on 10/4. Please advise.

## 2020-10-31 DIAGNOSIS — M1711 Unilateral primary osteoarthritis, right knee: Secondary | ICD-10-CM | POA: Diagnosis not present

## 2020-10-31 DIAGNOSIS — M7071 Other bursitis of hip, right hip: Secondary | ICD-10-CM | POA: Diagnosis not present

## 2020-10-31 DIAGNOSIS — D631 Anemia in chronic kidney disease: Secondary | ICD-10-CM | POA: Diagnosis not present

## 2020-10-31 DIAGNOSIS — J45909 Unspecified asthma, uncomplicated: Secondary | ICD-10-CM | POA: Diagnosis not present

## 2020-10-31 DIAGNOSIS — I5022 Chronic systolic (congestive) heart failure: Secondary | ICD-10-CM | POA: Diagnosis not present

## 2020-10-31 DIAGNOSIS — F4323 Adjustment disorder with mixed anxiety and depressed mood: Secondary | ICD-10-CM | POA: Diagnosis not present

## 2020-10-31 DIAGNOSIS — I130 Hypertensive heart and chronic kidney disease with heart failure and stage 1 through stage 4 chronic kidney disease, or unspecified chronic kidney disease: Secondary | ICD-10-CM | POA: Diagnosis not present

## 2020-10-31 DIAGNOSIS — I2510 Atherosclerotic heart disease of native coronary artery without angina pectoris: Secondary | ICD-10-CM | POA: Diagnosis not present

## 2020-10-31 DIAGNOSIS — N183 Chronic kidney disease, stage 3 unspecified: Secondary | ICD-10-CM | POA: Diagnosis not present

## 2020-10-31 NOTE — Telephone Encounter (Signed)
Called patient to advise that we will get auth ffrom insurance and ok to hold eliquis before we schedule injection.

## 2020-11-03 ENCOUNTER — Other Ambulatory Visit: Payer: Self-pay | Admitting: Orthopaedic Surgery

## 2020-11-03 ENCOUNTER — Telehealth: Payer: Self-pay | Admitting: Physical Medicine and Rehabilitation

## 2020-11-03 ENCOUNTER — Telehealth: Payer: Self-pay | Admitting: Orthopaedic Surgery

## 2020-11-03 MED ORDER — OXYCODONE-ACETAMINOPHEN 5-325 MG PO TABS: 1.0000 | ORAL_TABLET | Freq: Four times a day (QID) | ORAL | 0 refills | Status: DC | PRN

## 2020-11-03 NOTE — Telephone Encounter (Signed)
cPt called about epidural injection. Please call pt about this matter at [phone].

## 2020-11-03 NOTE — Telephone Encounter (Signed)
Pt called for refill of pain medication. Please send to pharmacy on file. Please call pt when sent in. Pt phone number is [phone].

## 2020-11-04 ENCOUNTER — Other Ambulatory Visit: Payer: Self-pay | Admitting: Orthopaedic Surgery

## 2020-11-04 ENCOUNTER — Telehealth: Payer: Self-pay

## 2020-11-04 DIAGNOSIS — D631 Anemia in chronic kidney disease: Secondary | ICD-10-CM | POA: Diagnosis not present

## 2020-11-04 DIAGNOSIS — M7071 Other bursitis of hip, right hip: Secondary | ICD-10-CM | POA: Diagnosis not present

## 2020-11-04 DIAGNOSIS — N183 Chronic kidney disease, stage 3 unspecified: Secondary | ICD-10-CM | POA: Diagnosis not present

## 2020-11-04 DIAGNOSIS — I130 Hypertensive heart and chronic kidney disease with heart failure and stage 1 through stage 4 chronic kidney disease, or unspecified chronic kidney disease: Secondary | ICD-10-CM | POA: Diagnosis not present

## 2020-11-04 DIAGNOSIS — J45909 Unspecified asthma, uncomplicated: Secondary | ICD-10-CM | POA: Diagnosis not present

## 2020-11-04 DIAGNOSIS — F4323 Adjustment disorder with mixed anxiety and depressed mood: Secondary | ICD-10-CM | POA: Diagnosis not present

## 2020-11-04 DIAGNOSIS — M1711 Unilateral primary osteoarthritis, right knee: Secondary | ICD-10-CM | POA: Diagnosis not present

## 2020-11-04 DIAGNOSIS — I2510 Atherosclerotic heart disease of native coronary artery without angina pectoris: Secondary | ICD-10-CM | POA: Diagnosis not present

## 2020-11-04 DIAGNOSIS — I5022 Chronic systolic (congestive) heart failure: Secondary | ICD-10-CM | POA: Diagnosis not present

## 2020-11-04 MED ORDER — OXYCODONE-ACETAMINOPHEN 5-325 MG PO TABS: 1.0000 | ORAL_TABLET | Freq: Four times a day (QID) | ORAL | 0 refills | Status: DC | PRN

## 2020-11-04 NOTE — Telephone Encounter (Signed)
Pharmacy called and asked [place] to resend pain medication "It didn't come across correctly"? She just said re-send again [employer]

## 2020-11-05 NOTE — Telephone Encounter (Signed)
Called patient to advise that this has been approved by insurance but we are awaiting the ok to hold Eliquis.

## 2020-11-06 DIAGNOSIS — I2510 Atherosclerotic heart disease of native coronary artery without angina pectoris: Secondary | ICD-10-CM | POA: Diagnosis not present

## 2020-11-06 DIAGNOSIS — M1711 Unilateral primary osteoarthritis, right knee: Secondary | ICD-10-CM | POA: Diagnosis not present

## 2020-11-06 DIAGNOSIS — F4323 Adjustment disorder with mixed anxiety and depressed mood: Secondary | ICD-10-CM | POA: Diagnosis not present

## 2020-11-06 DIAGNOSIS — D631 Anemia in chronic kidney disease: Secondary | ICD-10-CM | POA: Diagnosis not present

## 2020-11-06 DIAGNOSIS — M7071 Other bursitis of hip, right hip: Secondary | ICD-10-CM | POA: Diagnosis not present

## 2020-11-06 DIAGNOSIS — N183 Chronic kidney disease, stage 3 unspecified: Secondary | ICD-10-CM | POA: Diagnosis not present

## 2020-11-06 DIAGNOSIS — J45909 Unspecified asthma, uncomplicated: Secondary | ICD-10-CM | POA: Diagnosis not present

## 2020-11-06 DIAGNOSIS — I130 Hypertensive heart and chronic kidney disease with heart failure and stage 1 through stage 4 chronic kidney disease, or unspecified chronic kidney disease: Secondary | ICD-10-CM | POA: Diagnosis not present

## 2020-11-06 DIAGNOSIS — I5022 Chronic systolic (congestive) heart failure: Secondary | ICD-10-CM | POA: Diagnosis not present

## 2020-11-10 ENCOUNTER — Ambulatory Visit (INDEPENDENT_AMBULATORY_CARE_PROVIDER_SITE_OTHER): Payer: Medicare HMO

## 2020-11-10 DIAGNOSIS — I5022 Chronic systolic (congestive) heart failure: Secondary | ICD-10-CM

## 2020-11-10 DIAGNOSIS — Z950 Presence of cardiac pacemaker: Secondary | ICD-10-CM

## 2020-11-11 DIAGNOSIS — F4323 Adjustment disorder with mixed anxiety and depressed mood: Secondary | ICD-10-CM | POA: Diagnosis not present

## 2020-11-11 DIAGNOSIS — D631 Anemia in chronic kidney disease: Secondary | ICD-10-CM | POA: Diagnosis not present

## 2020-11-11 DIAGNOSIS — I130 Hypertensive heart and chronic kidney disease with heart failure and stage 1 through stage 4 chronic kidney disease, or unspecified chronic kidney disease: Secondary | ICD-10-CM | POA: Diagnosis not present

## 2020-11-11 DIAGNOSIS — N183 Chronic kidney disease, stage 3 unspecified: Secondary | ICD-10-CM | POA: Diagnosis not present

## 2020-11-11 DIAGNOSIS — I2510 Atherosclerotic heart disease of native coronary artery without angina pectoris: Secondary | ICD-10-CM | POA: Diagnosis not present

## 2020-11-11 DIAGNOSIS — M7071 Other bursitis of hip, right hip: Secondary | ICD-10-CM | POA: Diagnosis not present

## 2020-11-11 DIAGNOSIS — J45909 Unspecified asthma, uncomplicated: Secondary | ICD-10-CM | POA: Diagnosis not present

## 2020-11-11 DIAGNOSIS — M1711 Unilateral primary osteoarthritis, right knee: Secondary | ICD-10-CM | POA: Diagnosis not present

## 2020-11-11 DIAGNOSIS — I5022 Chronic systolic (congestive) heart failure: Secondary | ICD-10-CM | POA: Diagnosis not present

## 2020-11-12 ENCOUNTER — Telehealth: Payer: Self-pay

## 2020-11-12 DIAGNOSIS — N183 Chronic kidney disease, stage 3 unspecified: Secondary | ICD-10-CM | POA: Diagnosis not present

## 2020-11-12 DIAGNOSIS — D631 Anemia in chronic kidney disease: Secondary | ICD-10-CM | POA: Diagnosis not present

## 2020-11-12 DIAGNOSIS — M7071 Other bursitis of hip, right hip: Secondary | ICD-10-CM | POA: Diagnosis not present

## 2020-11-12 DIAGNOSIS — F4323 Adjustment disorder with mixed anxiety and depressed mood: Secondary | ICD-10-CM | POA: Diagnosis not present

## 2020-11-12 DIAGNOSIS — I2510 Atherosclerotic heart disease of native coronary artery without angina pectoris: Secondary | ICD-10-CM | POA: Diagnosis not present

## 2020-11-12 DIAGNOSIS — M1711 Unilateral primary osteoarthritis, right knee: Secondary | ICD-10-CM | POA: Diagnosis not present

## 2020-11-12 DIAGNOSIS — I130 Hypertensive heart and chronic kidney disease with heart failure and stage 1 through stage 4 chronic kidney disease, or unspecified chronic kidney disease: Secondary | ICD-10-CM | POA: Diagnosis not present

## 2020-11-12 DIAGNOSIS — I5022 Chronic systolic (congestive) heart failure: Secondary | ICD-10-CM | POA: Diagnosis not present

## 2020-11-12 DIAGNOSIS — J45909 Unspecified asthma, uncomplicated: Secondary | ICD-10-CM | POA: Diagnosis not present

## 2020-11-12 NOTE — Telephone Encounter (Signed)
Remote ICM transmission received.  Attempted call to patient regarding ICM remote transmission and left detailed message per DPR.  Advised to return call for any fluid symptoms or questions. Next ICM remote transmission scheduled 12/22/2020.

## 2020-11-12 NOTE — Progress Notes (Signed)
EPIC Encounter for ICM Monitoring  Patient Name: Alexandra [name] is a 87 y.o. female Date: 11/12/2020 Primary Care Physican: Lawerance Cruel, MD Primary Cardiologist: Skains/Bensimhon Electrophysiologist: Vergie Living Pacing:  98%   06/20/2020 Weight: 165 lbs                                                                  Attempted call to patient and unable to reach.  Left detailed message per DPR regarding transmission. Transmission reviewed.    Corvue Thoracic impedance suggesting normal fluid levels.    Prescribed:  Furosemide 40 mg Take one tablet (40 mg) by mouth once every other day. May take extra as needed.   Potassium 10 mEq 1 tablet daily.   Labs: 09/21/2020 Creatinine 1.79, BUN 40, Potassium 4.1, Sodium 135, GFR 26 A complete set of results can be found in Results Review.   Recommendations:  Left voice mail with ICM number and encouraged to call if experiencing any fluid symptoms.   Follow-up plan: ICM clinic phone appointment on 12/22/2020.  91 day device clinic remote transmission 12/15/2020.     EP/Cardiology Office Visits:  Recall 03/19/2021 with Dr Caryl Comes.  Recall 02/14/2021 with Dr Haroldine Laws   Copy of ICM check sent to Dr. Caryl Comes.      3 month ICM trend: 11/10/2020.    1 Year ICM trend:       [name], RN 11/12/2020 10:23 AM

## 2020-11-14 ENCOUNTER — Telehealth: Payer: Self-pay | Admitting: Physical Medicine and Rehabilitation

## 2020-11-14 DIAGNOSIS — F4323 Adjustment disorder with mixed anxiety and depressed mood: Secondary | ICD-10-CM | POA: Diagnosis not present

## 2020-11-14 DIAGNOSIS — D631 Anemia in chronic kidney disease: Secondary | ICD-10-CM | POA: Diagnosis not present

## 2020-11-14 DIAGNOSIS — N183 Chronic kidney disease, stage 3 unspecified: Secondary | ICD-10-CM | POA: Diagnosis not present

## 2020-11-14 DIAGNOSIS — I5022 Chronic systolic (congestive) heart failure: Secondary | ICD-10-CM | POA: Diagnosis not present

## 2020-11-14 DIAGNOSIS — I130 Hypertensive heart and chronic kidney disease with heart failure and stage 1 through stage 4 chronic kidney disease, or unspecified chronic kidney disease: Secondary | ICD-10-CM | POA: Diagnosis not present

## 2020-11-14 DIAGNOSIS — M7071 Other bursitis of hip, right hip: Secondary | ICD-10-CM | POA: Diagnosis not present

## 2020-11-14 DIAGNOSIS — I2510 Atherosclerotic heart disease of native coronary artery without angina pectoris: Secondary | ICD-10-CM | POA: Diagnosis not present

## 2020-11-14 DIAGNOSIS — J45909 Unspecified asthma, uncomplicated: Secondary | ICD-10-CM | POA: Diagnosis not present

## 2020-11-14 DIAGNOSIS — M1711 Unilateral primary osteoarthritis, right knee: Secondary | ICD-10-CM | POA: Diagnosis not present

## 2020-11-14 NOTE — Telephone Encounter (Signed)
Patient called. She would like an appointment with Dr. Ernestina Patches. Her call back number is [phone]

## 2020-11-19 NOTE — Telephone Encounter (Signed)
Awaiting ok to hold Eliquis. I have faxed request to 2 different numbers without reply.

## 2020-11-21 NOTE — Telephone Encounter (Signed)
Patient needs to schedule an interlaminar epidural steroid injection with Dr. Ernestina Patches at [facility] and will need to hold her Eliquis for 2 days prior. Will this be ok?

## 2020-11-24 NOTE — Telephone Encounter (Signed)
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    Procedure: interlaminar epidural steroid injection  Date of procedure: TBD  CrCl 20.96  Per office protocol, patient can hold Eliquis for 3 days prior to procedure.

## 2020-11-24 NOTE — Telephone Encounter (Addendum)
   Name: Alexandra [name]  DOB: 10-28-1927  MRN: [id number]   Primary Cardiologist: None  Chart reviewed as part of pre-operative protocol coverage. Patient was contacted 11/24/2020 in reference to pre-operative risk assessment for pending surgery as outlined below.  Alexandra [name] was last seen on 03/19/2020 by Dr. Caryl Comes.  Since that day, Alexandra [name] has done well without exertional chest pain or worsening dyspnea.  Therefore, based on ACC/AHA guidelines, the patient would be at acceptable risk for the planned procedure without further cardiovascular testing.   Patient may hold his Eliquis for 3 days prior to the procedure and restart as soon as possible afterward at the surgeon's discretion.  The patient was advised that if she develops new symptoms prior to surgery to contact our office to arrange for a follow-up visit, and she verbalized understanding.  I will route this recommendation to the requesting party via Epic fax function and remove from pre-op pool. Please call with questions.  [address], [place] 11/24/2020, 6:58 PM

## 2020-11-24 NOTE — Telephone Encounter (Signed)
Awaiting recommendation regarding Eliquis by clinical pharmacist.

## 2020-11-25 NOTE — Telephone Encounter (Signed)
Patient scheduled and advised to hold Eliquis for 3 days.

## 2020-12-02 ENCOUNTER — Encounter: Payer: Self-pay | Admitting: Physical Medicine and Rehabilitation

## 2020-12-02 ENCOUNTER — Other Ambulatory Visit: Payer: Self-pay

## 2020-12-02 ENCOUNTER — Ambulatory Visit: Payer: Medicare HMO | Admitting: Physical Medicine and Rehabilitation

## 2020-12-02 ENCOUNTER — Ambulatory Visit: Payer: Self-pay

## 2020-12-02 VITALS — BP 134/80 | HR 103

## 2020-12-02 DIAGNOSIS — M5416 Radiculopathy, lumbar region: Secondary | ICD-10-CM | POA: Diagnosis not present

## 2020-12-02 MED ORDER — BETAMETHASONE SOD PHOS & ACET 6 (3-3) MG/ML IJ SUSP
12.0000 mg | Freq: Once | INTRAMUSCULAR | Status: AC
  Administered 2020-12-02: 11:00:00 12 mg

## 2020-12-02 NOTE — Progress Notes (Signed)
Pt state lower back pain tha travels down both legs. Pt state walking, standing and laying down makes the pain worse. Pt state she takes pain meds to help ease her pain.  Numeric Pain Rating Scale and Functional Assessment Average Pain 7   In the last MONTH (on 0-10 scale) has pain interfered with the following?  1. General activity like being  able to carry out your everyday physical activities such as walking, climbing stairs, carrying groceries, or moving a chair?  Rating(10)   +Driver, +BT pt stop BT three days ago, -Dye Allergies.

## 2020-12-02 NOTE — Patient Instructions (Signed)

## 2020-12-14 LAB — CUP PACEART REMOTE DEVICE CHECK
Battery Remaining Longevity: 7 mo
Battery Remaining Percentage: 9 %
Battery Voltage: 2.8 V
Brady Statistic AP VP Percent: 1.8 %
Brady Statistic AP VS Percent: 1 %
Brady Statistic AS VP Percent: 95 %
Brady Statistic AS VS Percent: 2.8 %
Brady Statistic RA Percent Paced: 1.7 %
Date Time Interrogation Session: 20221126051653
Implantable Lead Implant Date: 20161229
Implantable Lead Implant Date: 20161229
Implantable Lead Implant Date: 20161229
Implantable Lead Location: 753858
Implantable Lead Location: 753859
Implantable Lead Location: 753860
Implantable Pulse Generator Implant Date: 20161229
Lead Channel Impedance Value: 390 Ohm
Lead Channel Impedance Value: 390 Ohm
Lead Channel Impedance Value: 940 Ohm
Lead Channel Pacing Threshold Amplitude: 0.625 V
Lead Channel Pacing Threshold Amplitude: 0.75 V
Lead Channel Pacing Threshold Amplitude: 0.75 V
Lead Channel Pacing Threshold Pulse Width: 0.5 ms
Lead Channel Pacing Threshold Pulse Width: 0.5 ms
Lead Channel Pacing Threshold Pulse Width: 1.5 ms
Lead Channel Sensing Intrinsic Amplitude: 12 mV
Lead Channel Sensing Intrinsic Amplitude: 2.5 mV
Lead Channel Setting Pacing Amplitude: 1.625
Lead Channel Setting Pacing Amplitude: 2 V
Lead Channel Setting Pacing Amplitude: 2 V
Lead Channel Setting Pacing Pulse Width: 0.5 ms
Lead Channel Setting Pacing Pulse Width: 1.5 ms
Lead Channel Setting Sensing Sensitivity: 2 mV
Pulse Gen Model: 3262
Pulse Gen Serial Number: 7834528

## 2020-12-15 ENCOUNTER — Telehealth: Payer: Self-pay

## 2020-12-15 ENCOUNTER — Ambulatory Visit (INDEPENDENT_AMBULATORY_CARE_PROVIDER_SITE_OTHER): Payer: Medicare HMO

## 2020-12-15 DIAGNOSIS — I428 Other cardiomyopathies: Secondary | ICD-10-CM | POA: Diagnosis not present

## 2020-12-15 NOTE — Telephone Encounter (Signed)
Scheduled merlin transmission received- Device is estimated 7 months until ERI.    Scheduled patient for monthly battery checks to begin 01/23/20 (pt has ICM transmission due in December). Pt made aware.    LOV with Dr. Caryl Comes 03/2020, recall present for 03/2021.

## 2020-12-15 NOTE — Progress Notes (Signed)
Alexandra [name] - 87 y.o. female MRN [id number]  Date of birth: 10-Dec-1927  Office Visit Note: Visit Date: 12/02/2020 PCP: Lawerance Cruel, MD Referred by: Lawerance Cruel, MD  Subjective: Chief Complaint  Patient presents with   Lower Back - Pain   Right Leg - Pain   Left Leg - Pain   HPI:  Alexandra [name] is a 87 y.o. female who comes in today at the request of Barnet Pall, FNP for planned Right L5-S1 Lumbar Interlaminar epidural steroid injection with fluoroscopic guidance.  The patient has failed conservative care including home exercise, medications, time and activity modification.  This injection will be diagnostic and hopefully therapeutic.  Please see requesting physician notes for further details and justification.  ROS Otherwise per HPI.  Assessment & Plan: Visit Diagnoses:    ICD-10-CM   1. Lumbar radiculopathy  M54.16 XR C-ARM NO REPORT    Epidural Steroid injection    betamethasone acetate-betamethasone sodium phosphate (CELESTONE) injection 12 mg      Plan: No additional findings.   Meds & Orders:  Meds ordered this encounter  Medications   betamethasone acetate-betamethasone sodium phosphate (CELESTONE) injection 12 mg    Orders Placed This Encounter  Procedures   XR C-ARM NO REPORT   Epidural Steroid injection    Follow-up: Return if symptoms worsen or fail to improve.   Procedures: No procedures performed  Lumbar Epidural Steroid Injection - Interlaminar Approach with Fluoroscopic Guidance  Patient: Alexandra [name]      Date of Birth: [birth year] MRN: [id number] PCP: Lawerance Cruel, MD      Visit Date: 12/02/2020   Universal Protocol:     Consent Given By: the patient  Position: PRONE  Additional Comments: Vital signs were monitored before and after the procedure. Patient was prepped and draped in the usual sterile fashion. The correct patient, procedure, and site was verified.   Injection Procedure Details:   Procedure  diagnoses: Lumbar radiculopathy [M54.16]   Meds Administered:  Meds ordered this encounter  Medications   betamethasone acetate-betamethasone sodium phosphate (CELESTONE) injection 12 mg     Laterality: Right  Location/Site:  L5-S1  Needle: 3.5 in., 20 ga. Tuohy  Needle Placement: Paramedian epidural  Findings:   -Comments: Excellent flow of contrast into the epidural space.  Procedure Details: Using a paramedian approach from the side mentioned above, the region overlying the inferior lamina was localized under fluoroscopic visualization and the soft tissues overlying this structure were infiltrated with 4 ml. of 1% Lidocaine without Epinephrine. The Tuohy needle was inserted into the epidural space using a paramedian approach.   The epidural space was localized using loss of resistance along with counter oblique bi-planar fluoroscopic views.  After negative aspirate for air, blood, and CSF, a 2 ml. volume of Isovue-250 was injected into the epidural space and the flow of contrast was observed. Radiographs were obtained for documentation purposes.    The injectate was administered into the level noted above.   Additional Comments:  The patient tolerated the procedure well Dressing: 2 x 2 sterile gauze and Band-Aid    Post-procedure details: Patient was observed during the procedure. Post-procedure instructions were reviewed.  Patient left the clinic in stable condition.   Clinical History: CT LUMBAR MYELOGRAM FINDINGS:   The patient was scanned both prone and supine.   T11-12: Minimal disc bulge. Mild facet osteoarthritis. No stenosis.   T12-L1: Minimal disc bulge. Mild facet osteoarthritis. No stenosis.   L1-2: Disc space  narrowing. Endplate osteophytes and bulging of the disc. No compressive stenosis.   L2-3: Bulging of the disc. Mild facet and ligamentous hypertrophy. Right posterolateral extraforaminal osteophyte formation with some potential to affect the  right L2 nerve in the extraforaminal location.   L3-4: Chronic superior endplate deformity at L4. This does not represent a recent fracture. Discogenic endplate changes on the right at both L3 and L4. Endplate osteophytes and bulging of the disc more prominent towards the right. Right foraminal stenosis that likely compresses the right L3 nerve. Good posterior decompression with wide patency of the central canal. Mild left foraminal narrowing.   L4-5: Previous posterior decompression with good patency of the central canal. Endplate osteophytes and protruding disc material more prominent towards the right. Bilateral facet hypertrophy. Right foraminal stenosis likely to compress the right L4 nerve. Mild narrowing of the foramen on the left.   L5-S1: Mild bulging of the disc. Mild facet degeneration and hypertrophy. No compressive stenosis.   IMPRESSION: The patient has had previous posterior decompression at L3-4 and L4-5. There is wide patency of the central canal at those 2 levels and elsewhere throughout the lower thoracic and lumbar region. There is, however, severe right foraminal encroachment at L3-4 and L4-5 due to osteophyte and disc material which would seem likely to compress the right L3 and L4 nerves.   At L2-3, there is a right extraforaminal osteophyte which would have some potential to affect the right L2 nerve in an extraforaminal location.   Non-compressive degenerative changes at the other levels as outlined above.     Electronically Signed   By: Nelson Chimes M.D.   On: 10/09/2020 10:33     Objective:  VS:  HT:    WT:   BMI:     BP:134/80  HR:(!) 103bpm  TEMP: ( )  RESP:  Physical Exam Vitals and nursing note reviewed.  Constitutional:      General: She is not in acute distress.    Appearance: Normal appearance. She is not ill-appearing.  HENT:     Head: Normocephalic and atraumatic.     Right Ear: External ear normal.     Left Ear: External  ear normal.  Eyes:     Extraocular Movements: Extraocular movements intact.  Cardiovascular:     Rate and Rhythm: Normal rate.     Pulses: Normal pulses.  Pulmonary:     Effort: Pulmonary effort is normal. No respiratory distress.  Abdominal:     General: There is no distension.     Palpations: Abdomen is soft.  Musculoskeletal:        General: Tenderness present.     Cervical back: Neck supple.     Right lower leg: No edema.     Left lower leg: No edema.     Comments: Patient has good distal strength with no pain over the greater trochanters.  No clonus or focal weakness.  Skin:    Findings: No erythema, lesion or rash.  Neurological:     General: No focal deficit present.     Mental Status: She is alert and oriented to person, place, and time.     Sensory: No sensory deficit.     Motor: No weakness or abnormal muscle tone.     Coordination: Coordination normal.  Psychiatric:        Mood and Affect: Mood normal.        Behavior: Behavior normal.     Imaging: No results found.

## 2020-12-15 NOTE — Procedures (Signed)
Lumbar Epidural Steroid Injection - Interlaminar Approach with Fluoroscopic Guidance  Patient: Alexandra [name]      Date of Birth: 01-28-1927 MRN: [id number] PCP: Lawerance Cruel, MD      Visit Date: 12/02/2020   Universal Protocol:     Consent Given By: the patient  Position: PRONE  Additional Comments: Vital signs were monitored before and after the procedure. Patient was prepped and draped in the usual sterile fashion. The correct patient, procedure, and site was verified.   Injection Procedure Details:   Procedure diagnoses: Lumbar radiculopathy [M54.16]   Meds Administered:  Meds ordered this encounter  Medications   betamethasone acetate-betamethasone sodium phosphate (CELESTONE) injection 12 mg     Laterality: Right  Location/Site:  L5-S1  Needle: 3.5 in., 20 ga. Tuohy  Needle Placement: Paramedian epidural  Findings:   -Comments: Excellent flow of contrast into the epidural space.  Procedure Details: Using a paramedian approach from the side mentioned above, the region overlying the inferior lamina was localized under fluoroscopic visualization and the soft tissues overlying this structure were infiltrated with 4 ml. of 1% Lidocaine without Epinephrine. The Tuohy needle was inserted into the epidural space using a paramedian approach.   The epidural space was localized using loss of resistance along with counter oblique bi-planar fluoroscopic views.  After negative aspirate for air, blood, and CSF, a 2 ml. volume of Isovue-250 was injected into the epidural space and the flow of contrast was observed. Radiographs were obtained for documentation purposes.    The injectate was administered into the level noted above.   Additional Comments:  The patient tolerated the procedure well Dressing: 2 x 2 sterile gauze and Band-Aid    Post-procedure details: Patient was observed during the procedure. Post-procedure instructions were reviewed.  Patient left the  clinic in stable condition.

## 2020-12-18 ENCOUNTER — Telehealth: Payer: Self-pay | Admitting: Physical Medicine and Rehabilitation

## 2020-12-18 ENCOUNTER — Other Ambulatory Visit: Payer: Self-pay | Admitting: Orthopaedic Surgery

## 2020-12-18 MED ORDER — OXYCODONE-ACETAMINOPHEN 5-325 MG PO TABS: 1.0000 | ORAL_TABLET | Freq: Three times a day (TID) | ORAL | 0 refills | Status: DC | PRN

## 2020-12-18 NOTE — Telephone Encounter (Signed)
Pt called requesting a refill of oxycodone. Pt states the injections wore off and need her pain medication. Please send meds to pharmacy on file. Pt phone number is [phone].

## 2020-12-18 NOTE — Telephone Encounter (Signed)
Pt would like a refill on oxycodone.

## 2020-12-18 NOTE — Telephone Encounter (Signed)
Please advise 

## 2020-12-18 NOTE — Telephone Encounter (Signed)
Double message. Message already sent to dr. Ninfa Linden to review

## 2020-12-22 ENCOUNTER — Ambulatory Visit (INDEPENDENT_AMBULATORY_CARE_PROVIDER_SITE_OTHER): Payer: Medicare HMO

## 2020-12-22 DIAGNOSIS — Z950 Presence of cardiac pacemaker: Secondary | ICD-10-CM

## 2020-12-22 DIAGNOSIS — I5022 Chronic systolic (congestive) heart failure: Secondary | ICD-10-CM

## 2020-12-22 NOTE — Progress Notes (Signed)
EPIC Encounter for ICM Monitoring  Patient Name: Alexandra [name] is a 87 y.o. female Date: 12/22/2020 Primary Care Physican: Lawerance Cruel, MD Primary Cardiologist: Skains/Bensimhon Electrophysiologist: Vergie Living Pacing:  97%   06/20/2020 Weight: 165 lbs  Battery Longevity 7.1 months                                                                  Attempted call to patient and unable to reach.  Left message to return call. Transmission reviewed.    Corvue Thoracic impedance suggesting possible fluid accumulation since 12/05/2020 (17 days).    Prescribed:  Furosemide 40 mg Take one tablet (40 mg) by mouth once every other day. May take extra as needed.   Potassium 10 mEq 1 tablet daily.   Labs: 09/21/2020 Creatinine 1.79, BUN 40, Potassium 4.1, Sodium 135, GFR 26 A complete set of results can be found in Results Review.   Recommendations:  Unable to reach.   Will advise to take extra Furosemide as prescribed if patient is reached.   Follow-up plan: ICM clinic phone appointment on 12/30/2020 to recheck fluid levels.  91 day device clinic remote transmission 03/16/2021.     EP/Cardiology Office Visits:  Recall 03/19/2021 with Dr Caryl Comes.  Recall 02/14/2021 with Dr Haroldine Laws   Copy of ICM check sent to Dr. Caryl Comes.    Will send to Dr Haroldine Laws for review if patient is reached.   3 month ICM trend: 12/22/2020.    12-14 Month ICM trend:       [name], RN 12/22/2020 4:45 PM

## 2020-12-23 ENCOUNTER — Telehealth: Payer: Self-pay

## 2020-12-23 NOTE — Progress Notes (Signed)
Spoke with patient and heart failure questions reviewed.  Pt asymptomatic for fluid accumulation and feeling well.  She thinks maybe the holiday foods may contribute to decreased impedance.  Advised to take extra Furosemide tablet x 2 days and then return to prescribed dosage.  Weight: 165 lbs.  She will try limiting salt intake.

## 2020-12-23 NOTE — Progress Notes (Signed)
Remote pacemaker transmission.   

## 2020-12-23 NOTE — Telephone Encounter (Signed)
Remote ICM transmission received.  Attempted call to patient regarding ICM remote transmission and left message to return call   

## 2020-12-24 NOTE — Progress Notes (Signed)
Monthly battery checks are scheduled.

## 2020-12-30 ENCOUNTER — Ambulatory Visit (INDEPENDENT_AMBULATORY_CARE_PROVIDER_SITE_OTHER): Payer: Medicare HMO

## 2020-12-30 DIAGNOSIS — Z950 Presence of cardiac pacemaker: Secondary | ICD-10-CM

## 2020-12-30 DIAGNOSIS — I5022 Chronic systolic (congestive) heart failure: Secondary | ICD-10-CM

## 2020-12-30 NOTE — Progress Notes (Signed)
EPIC Encounter for ICM Monitoring  Patient Name: Alexandra [name] is a 87 y.o. female Date: 12/30/2020 Primary Care Physican: Lawerance Cruel, MD Primary Cardiologist: Skains/Bensimhon Electrophysiologist: Vergie Living Pacing:  97%   06/20/2020 Weight: 165 lbs   Battery Longevity 6.9 months                                                                  Spoke with patient and heart failure questions reviewed.  Pt asymptomatic for fluid accumulation.  She is back to prescribed dosage of Furosemide.   Corvue Thoracic impedance suggesting fluid levels returned to normal in response to taking extra Furosemide.    Prescribed:  Furosemide 40 mg Take one tablet (40 mg) by mouth once every other day. May take extra as needed.   Potassium 10 mEq 1 tablet daily.   Labs: 09/21/2020 Creatinine 1.79, BUN 40, Potassium 4.1, Sodium 135, GFR 26 A complete set of results can be found in Results Review.   Recommendations:  No changes and encouraged to call if experiencing any fluid symptoms.   Follow-up plan: ICM clinic phone appointment on 02/02/2021.  91 day device clinic remote transmission 03/16/2021.     EP/Cardiology Office Visits:  Recall 03/19/2021 with Dr Caryl Comes.  Recall 02/14/2021 with Dr Haroldine Laws   Copy of ICM check sent to Dr. Caryl Comes.   3 month ICM trend: 12/30/2020.    12-14 Month ICM trend:       [name], RN 12/30/2020 3:48 PM

## 2021-01-05 ENCOUNTER — Telehealth: Payer: Self-pay | Admitting: Internal Medicine

## 2021-01-05 NOTE — Telephone Encounter (Signed)
°  Pt c/o Shortness Of Breath: STAT if SOB developed within the last 24 hours or pt is noticeably SOB on the phone  1. Are you currently SOB (can you hear that pt is SOB on the phone)? No  2. How long have you been experiencing SOB? About a week  3. Are you SOB when sitting or when up moving around? Only when moving around  4. Are you currently experiencing any other symptoms? Denies any other symptoms

## 2021-01-05 NOTE — Telephone Encounter (Signed)
Spoke with pt and advised per Sharman Cheek, RN and transmission her fluid levels are normal.  Pt is due to be seen By Dr Missy Sabins 01/2021 with an echo prior to visit.  Active order is available.  Pt is due to see Dr Caryl Comes 03/2021. Pt advised will forward to CHF clinic for review and scheduling.  Reviewed ED precautions.  Pt verbalizes understanding and agrees with current plan.

## 2021-01-05 NOTE — Telephone Encounter (Signed)
Spoke with pt who reports increasing SOB on exertion over the past week.  Pt states she is taking medications as prescribed.  She denies current CP, SOB or dizziness.   Requested pt send a transmission for review.  Pt verbalizes understanding and agrees with current plan.

## 2021-01-05 NOTE — Telephone Encounter (Signed)
Attempted ICM call to patient and no answer.   Received 01/05/2021 remote transmission.    CorVue thoracic impedance suggesting fluid levels are at baseline normal but was suggesting possible dryness from 12/9-12/14.  Will route to Dr Aquilla Hacker nurse Keitha Butte for follow up since report is suggesting fluid levels are normal.

## 2021-01-22 ENCOUNTER — Ambulatory Visit (INDEPENDENT_AMBULATORY_CARE_PROVIDER_SITE_OTHER): Payer: Medicare HMO

## 2021-01-22 DIAGNOSIS — I428 Other cardiomyopathies: Secondary | ICD-10-CM | POA: Diagnosis not present

## 2021-01-22 DIAGNOSIS — I5022 Chronic systolic (congestive) heart failure: Secondary | ICD-10-CM

## 2021-01-22 LAB — CUP PACEART REMOTE DEVICE CHECK
Battery Remaining Longevity: 6 mo
Battery Remaining Percentage: 7 %
Battery Voltage: 2.78 V
Brady Statistic AP VP Percent: 1.6 %
Brady Statistic AP VS Percent: 1 %
Brady Statistic AS VP Percent: 95 %
Brady Statistic AS VS Percent: 3.4 %
Brady Statistic RA Percent Paced: 1.5 %
Date Time Interrogation Session: 20230105033458
Implantable Lead Implant Date: 20161229
Implantable Lead Implant Date: 20161229
Implantable Lead Implant Date: 20161229
Implantable Lead Location: 753858
Implantable Lead Location: 753859
Implantable Lead Location: 753860
Implantable Pulse Generator Implant Date: 20161229
Lead Channel Impedance Value: 390 Ohm
Lead Channel Impedance Value: 390 Ohm
Lead Channel Impedance Value: 910 Ohm
Lead Channel Pacing Threshold Amplitude: 0.625 V
Lead Channel Pacing Threshold Amplitude: 0.75 V
Lead Channel Pacing Threshold Amplitude: 0.75 V
Lead Channel Pacing Threshold Pulse Width: 0.5 ms
Lead Channel Pacing Threshold Pulse Width: 0.5 ms
Lead Channel Pacing Threshold Pulse Width: 1.5 ms
Lead Channel Sensing Intrinsic Amplitude: 12 mV
Lead Channel Sensing Intrinsic Amplitude: 2.2 mV
Lead Channel Setting Pacing Amplitude: 1.625
Lead Channel Setting Pacing Amplitude: 2 V
Lead Channel Setting Pacing Amplitude: 2 V
Lead Channel Setting Pacing Pulse Width: 0.5 ms
Lead Channel Setting Pacing Pulse Width: 1.5 ms
Lead Channel Setting Sensing Sensitivity: 2 mV
Pulse Gen Model: 3262
Pulse Gen Serial Number: 7834528

## 2021-01-27 NOTE — Telephone Encounter (Signed)
Home health

## 2021-01-28 NOTE — Progress Notes (Signed)
Patient ID: Alexandra [name], female   DOB: 02-25-1927, 88 y.o.   MRN: [id number]     Advanced Heart Failure Clinic Note   Primary Care: Dr. Melinda Crutch Primary Cardiologist: Dr Candee Furbish Primary HF: Dr Haroldine Laws  HPI: Alexandra [name] is a 88 y.o. 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 Discharge weight was 196.  She was admitted in July 2016 for Abdominal pain and SOB. Her Gallbladder was thought to be causing her pain, so a lap choley was performed that admission. Echo on 08/15/14 showed EF 20%. There were thoughts that she may have stress induced CMP due to the death of her husband in May 24, 2022 after several months of hospice care.    She was directly admitted to [facility] on 10/11/14 with concerns for low output.  Milrinone started when co-ox dropped to 53%. She had SVT on milrinone 0.25 so started on amio. Developed acute SOB that improved quickly with cessation of amio and extra dose of IV lasix. Felt to possibly have acute amio toxicity. Milrinone decreased to 0.125 and no further PSVT. Sent home on milrinone 0.125. Diuresed well on 80 mg lasix IB BID. Discharge weight was 170.  Admitted 12/1 through 12/6 with PICC line infection. Bcx with [facility] 2/2 with for diptheroids. Bcx 1/2 at [facility]-Bacteremia-cornybacterium . PICC line replaced. She continued on milrinone 0.125 mcg.   S/p CRT-D placement 01/16/15 with Dr Caryl Comes.   Echo 2/17 EF ~40% Milrinone stopped 02/2015. Has had no major issues since.    Echo 7/20 EF 55%  Last seen in HF clinic in 01/22. Volume looked good.   Taking lasix every other day. Followed by Sharman Cheek - takes extra lasix as directed. Called the clinic in December with complaints of dyspnea. Device interrogated. Impedance did not suggest  volume overload.  She is here today for f/u. Reports increasing shortness of breath over the period of at least 3 months. Ambulates at home with a cane. Lives alone and does her own dishes and some chores. Notes that she gets winded easily when walking any distance. No increased leg edema or weight gain. Last took an extra water pill around Christmas after she ate more sodium than usual. Weight has been stable. No CP or palpitations. Compliant with all medications.    ICD interrogated personally: CorVue impedance above threshold, 97% BiV pacing  ECG: SR, V paced.   RHC 7/17 RA = 1 RV = 25/2 PA = 29/9 (15) PCW = 6 Fick cardiac output/index = 5.9/3.3 PVR = 1.5 WU Ao sat = 98% PA sat = 68%, 70%, 71%  ECHO 10/02/14 LVEF 15% with diffuse hypokinesis, RV mildly dilated, normal function, PA peak pressure 59 mmHg Echo 2/17 EF 40-45% (I fetlt 35%)   Review of systems complete and found to be negative unless listed in HPI.   Past Medical History:  Diagnosis Date   Adult hypothyroidism 04/02/2014   Anemia of chronic disease 08/13/2014   Anxiety    Arthritis    Asthma    breast ca dx'd 2000   left   Bursitis    right hip   CAP (community acquired pneumonia) 12/19/2012   Chest pain    NM stress test 05/2011 Normal   CHF (congestive heart  failure) ([place]) 08/19/2014   CKD (chronic kidney disease) stage 3, GFR 30-59 ml/min (HCC) 08/13/2014   Depression    DOE (dyspnea on exertion)    No SOB at rest   Essential (primary) hypertension 04/02/2014   Fatigue    excertional   YTKPTWSF(681.2)    Hereditary and idiopathic peripheral neuropathy 07/02/2014   Hypercholesteremia    Intermittent LBBB (left bundle branch block) 12/21/2012   Leg weakness    Neuropathy    Obesity    Osteopenia    Peripheral neuropathy    Presence of permanent cardiac pacemaker 01/16/2015   BIV   Sacroiliitis, not elsewhere classified ([place])    Situational depression    Trigger finger    [name]     Current Outpatient Medications  Medication Sig Dispense Refill   albuterol (PROVENTIL HFA;VENTOLIN HFA) 108 (90 BASE) MCG/ACT inhaler Inhale 2 puffs into the lungs every 6 (six) hours as needed for wheezing or shortness of breath. Reported on 05/27/2015     ALPRAZolam (XANAX) 0.25 MG tablet Take 0.25 mg by mouth 3 (three) times daily as needed for anxiety. Reported on 05/27/2015     amoxicillin (AMOXIL) 500 MG capsule Take 4 capsules by mouth as needed 1 hour before dental work 4 capsule prn   b complex vitamins tablet Take 1 tablet by mouth daily with lunch.      calcium carbonate (TUMS - DOSED IN MG ELEMENTAL CALCIUM) 500 MG chewable tablet Chew 2 tablets by mouth daily as needed for indigestion or heartburn. Reported on 07/18/2015     cholecalciferol (VITAMIN D) 1000 UNITS tablet Take 1,000 Units by mouth daily with lunch.      citalopram (CELEXA) 10 MG tablet Take 10 mg by mouth daily.     ELIQUIS 2.5 MG TABS tablet TAKE 1 TABLET(2.5 MG) BY MOUTH TWICE DAILY 60 tablet 11   famotidine (PEPCID) 20 MG tablet Take 20 mg by mouth daily.     ferrous sulfate 325 (65 FE) MG tablet Take 325 mg by mouth daily with breakfast.     furosemide (LASIX) 40 MG tablet Take one tablet (40 mg) by mouth once every other day. May take extra as needed. 30 tablet 3   hydrALAZINE (APRESOLINE) 25 MG tablet Take 1 tablet (25 mg total) by mouth 2 (two) times daily. 270 tablet 0   lactose free nutrition (BOOST) LIQD Take 237 mLs by mouth daily with breakfast.      levothyroxine (SYNTHROID, LEVOTHROID) 25 MCG tablet Take 25 mcg by mouth daily before breakfast. for thyroid     MAGNESIUM-OXIDE 400 (241.3 Mg) MG tablet TAKE 1/2 TABLET(200 MG) BY MOUTH DAILY 15 tablet 11   Multiple Vitamin (MULITIVITAMIN WITH MINERALS) TABS Take 1 tablet by mouth daily with lunch.      nitroGLYCERIN (NITROSTAT) 0.4 MG SL tablet Place 1 tablet (0.4 mg total) under the tongue every 5 (five) minutes as needed for chest pain. 25 tablet 4    oxyCODONE-acetaminophen (PERCOCET/ROXICET) 5-325 MG tablet Take 1 tablet by mouth every 8 (eight) hours as needed for severe pain. 30 tablet 0   Polyethyl Glycol-Propyl Glycol (SYSTANE OP) Place 1 drop into both eyes 2 (two) times daily as needed (dry eyes).     potassium chloride (K-DUR) 10 MEQ tablet TAKE 1 TABLET BY MOUTH DAILY 90 tablet 2   potassium chloride (MICRO-K) 10 MEQ CR capsule Take 10 mEq by mouth daily.     spironolactone (ALDACTONE) 25 MG tablet Take 25 mg by mouth  daily.     No current facility-administered medications for this encounter.    Allergies  Allergen Reactions   Amiodarone Shortness Of Breath and Nausea And Vomiting   Cymbalta [Duloxetine Hcl] Other (See Comments)    Depression   Lyrica [Pregabalin] Other (See Comments)    Blurry vision      Social History   Socioeconomic History   Marital status: Married    Spouse name: Not on file   Number of children: 5   Years of education: HS   Highest education level: Not on file  Occupational History   Occupation: Retired  Tobacco Use   Smoking status: Never   Smokeless tobacco: Never  Vaping Use   Vaping Use: Never used  Substance and Sexual Activity   Alcohol use: No    Alcohol/week: 0.0 standard drinks   Drug use: No   Sexual activity: Never  Other Topics Concern   Not on file  Social History Narrative   Lives at home alone.   Right-handed.   Two cups caffeine daily (coffee).   Social Determinants of Health   Financial Resource Strain: Not on file  Food Insecurity: Not on file  Transportation Needs: Not on file  Physical Activity: Not on file  Stress: Not on file  Social Connections: Not on file  Intimate Partner Violence: Not on file      Family History  Problem Relation Age of Onset   Pneumonia Mother 34       cause of death   Diabetes Brother    Pancreatic cancer Father 65   Alzheimer's disease Sister    CVA Daughter        01/21/09   Stroke Daughter    Hypertension Daughter     Heart attack Neg Hx     Vitals:   01/29/21 1414  BP: (!) 124/58  Pulse: 91  SpO2: 98%  Weight: 73.8 kg (162 lb 12.8 oz)    Wt Readings from Last 3 Encounters:  01/29/21 73.8 kg (162 lb 12.8 oz)  03/19/20 75.3 kg (166 lb)  02/14/20 74.5 kg (164 lb 3.2 oz)    PHYSICAL EXAM: General:  Appears younger than stated age. No distress. HEENT: normal Neck: supple. no JVD. Carotids 2+ bilat; no bruits. No lymphadenopathy or thryomegaly appreciated. Cor: PMI nondisplaced. Regular rate & rhythm. No rubs, gallops or murmurs. Lungs: clear Abdomen: soft, nontender, nondistended. No hepatosplenomegaly. No bruits or masses. Good bowel sounds. Extremities: no cyanosis, clubbing, rash, edema Neuro: alert & orientedx3, cranial nerves grossly intact. moves all 4 extremities w/o difficulty. Affect pleasant  ASSESSMENT & PLAN:  1. Chronic Systolic Heart failure - Echo 10/02/14 EF 15% Echo 2/17 read as 40-45% reviewed probably closer to 35%. Off milrinone since 2017.  - s/p St Jude CRT-D placement. 01/16/15 - Echo 11/2016 LVEF 45-50%, Grade 1 DD, Moderate AI - Echo 7/20 EF 55% (completely recovered with CRT) - NYHA II-III. Suspect deconditioning playing a role in dyspnea. Encouraged her to increase activity level. - Volume status appears okay on exam and impedance monitoring. Continue lasix 40 qod. Takes extra as needed. Follows with Sharman Cheek. Will continue.  - Continue hydralazine 25 mg BID - Continue spiro 12.5 mg daily - Previously failed b-blocker. - No ACE due to CKD.  - Reinforced fluid restriction to < 2 L daily, sodium restriction to less than 2000 mg daily, and the importance of daily weights.   - Due for echo. Will arrange same day as next f/u - BMET,  BNP, CBC today 2. H/o PE/ DVT bilateral by [place] 08/20/2014 - Continue apixaban 2.5 bid for long-term prevention of DVT per Amplify EXT trial. No bleeding. - CBC today  3. CKD Stage 3b - Baseline Scr 1.6-1.8. BMET today 4. Hx of LBBB -  s/p CRT-D. Sees Dr. Caryl Comes 95% BiV pacing 5. Hypertension  - BP at goal. 6. Intolerance of amiodarone due to possible acute lung toxicity - No change to current plan. Off amio.   Follow-up: As scheduled in March 2023 with Dr. Haroldine Laws, will arrange for echo same day as follow-up   Elysabeth Aust N, PA-C  4:54 PM

## 2021-01-29 ENCOUNTER — Ambulatory Visit (HOSPITAL_COMMUNITY)
Admission: RE | Admit: 2021-01-29 | Discharge: 2021-01-29 | Disposition: A | Discharge status: Home / Self Care | Payer: Medicare HMO | Source: Ambulatory Visit | Attending: Physician Assistant | Admitting: Physician Assistant

## 2021-01-29 ENCOUNTER — Encounter (HOSPITAL_COMMUNITY): Payer: Self-pay

## 2021-01-29 ENCOUNTER — Other Ambulatory Visit: Payer: Self-pay

## 2021-01-29 VITALS — BP 124/58 | HR 91 | Wt 162.8 lb

## 2021-01-29 DIAGNOSIS — Z95810 Presence of automatic (implantable) cardiac defibrillator: Secondary | ICD-10-CM | POA: Diagnosis not present

## 2021-01-29 DIAGNOSIS — Z7901 Long term (current) use of anticoagulants: Secondary | ICD-10-CM | POA: Insufficient documentation

## 2021-01-29 DIAGNOSIS — I447 Left bundle-branch block, unspecified: Secondary | ICD-10-CM

## 2021-01-29 DIAGNOSIS — Z79899 Other long term (current) drug therapy: Secondary | ICD-10-CM | POA: Insufficient documentation

## 2021-01-29 DIAGNOSIS — I130 Hypertensive heart and chronic kidney disease with heart failure and stage 1 through stage 4 chronic kidney disease, or unspecified chronic kidney disease: Secondary | ICD-10-CM | POA: Insufficient documentation

## 2021-01-29 DIAGNOSIS — Z86711 Personal history of pulmonary embolism: Secondary | ICD-10-CM | POA: Diagnosis not present

## 2021-01-29 DIAGNOSIS — Z86718 Personal history of other venous thrombosis and embolism: Secondary | ICD-10-CM | POA: Diagnosis not present

## 2021-01-29 DIAGNOSIS — I5022 Chronic systolic (congestive) heart failure: Secondary | ICD-10-CM | POA: Insufficient documentation

## 2021-01-29 DIAGNOSIS — I10 Essential (primary) hypertension: Secondary | ICD-10-CM | POA: Diagnosis not present

## 2021-01-29 DIAGNOSIS — E039 Hypothyroidism, unspecified: Secondary | ICD-10-CM | POA: Insufficient documentation

## 2021-01-29 DIAGNOSIS — N1832 Chronic kidney disease, stage 3b: Secondary | ICD-10-CM | POA: Diagnosis not present

## 2021-01-29 LAB — BASIC METABOLIC PANEL
Anion gap: 9 (ref 5–15)
BUN: 30 mg/dL — ABNORMAL HIGH (ref 8–23)
CO2: 28 mmol/L (ref 22–32)
Calcium: 9.6 mg/dL (ref 8.9–10.3)
Chloride: 105 mmol/L (ref 98–111)
Creatinine, Ser: 1.24 mg/dL — ABNORMAL HIGH (ref 0.44–1.00)
GFR, Estimated: 41 mL/min — ABNORMAL LOW (ref >60)
Glucose, Bld: 94 mg/dL (ref 70–99)
Potassium: 4.2 mmol/L (ref 3.5–5.1)
Sodium: 142 mmol/L (ref 135–145)

## 2021-01-29 LAB — CBC
HCT: 38.9 % (ref 36.0–46.0)
Hemoglobin: 12.3 g/dL (ref 12.0–15.0)
MCH: 32 pg (ref 26.0–34.0)
MCHC: 31.6 g/dL (ref 30.0–36.0)
MCV: 101.3 fL — ABNORMAL HIGH (ref 80.0–100.0)
Platelets: 193 10*3/uL (ref 150–400)
RBC: 3.84 MIL/uL — ABNORMAL LOW (ref 3.87–5.11)
RDW: 13.2 % (ref 11.5–15.5)
WBC: 7.1 10*3/uL (ref 4.0–10.5)
nRBC: 0 % (ref 0.0–0.2)

## 2021-01-29 LAB — BRAIN NATRIURETIC PEPTIDE: B Natriuretic Peptide: 49.5 pg/mL (ref 0.0–100.0)

## 2021-01-29 NOTE — Patient Instructions (Signed)
It was great to see you today! No medication changes are needed at this time.  Labs today We will only contact you if something comes back abnormal or we need to make some changes. Otherwise no news is good news!  Your physician recommends that you schedule a follow-up appointment in: 1-2 months with Dr Haroldine Laws

## 2021-02-02 ENCOUNTER — Ambulatory Visit (INDEPENDENT_AMBULATORY_CARE_PROVIDER_SITE_OTHER): Payer: Medicare HMO

## 2021-02-02 DIAGNOSIS — Z950 Presence of cardiac pacemaker: Secondary | ICD-10-CM | POA: Diagnosis not present

## 2021-02-02 DIAGNOSIS — I5022 Chronic systolic (congestive) heart failure: Secondary | ICD-10-CM

## 2021-02-02 NOTE — Progress Notes (Signed)
Remote pacemaker transmission.   

## 2021-02-04 NOTE — Progress Notes (Signed)
EPIC Encounter for ICM Monitoring  Patient Name: Alexandra [name] is a 88 y.o. female Date: 02/04/2021 Primary Care Physican: Lawerance Cruel, MD Primary Cardiologist: Skains/Bensimhon Electrophysiologist: Vergie Living Pacing:  97%   02/04/2021 Weight: 162 lbs   Battery Longevity 5.8 months                                                                  Spoke with patient and heart failure questions reviewed.  Pt asymptomatic for fluid accumulation.  Took extra Furosemide during decreased impedance.   Corvue Thoracic impedance normal but was suggesting possible fluid accumulation from 12/24-1/6.    Prescribed:  Furosemide 40 mg Take one tablet (40 mg) by mouth once every other day. May take extra as needed.   Potassium 10 mEq 1 tablet daily.   Labs: 01/29/2021 Creatinine 1.24, BUN 30, Potassium 4.2, Sodium 142, GFR 41 09/21/2020 Creatinine 1.79, BUN 40, Potassium 4.1, Sodium 135, GFR 26 A complete set of results can be found in Results Review.   Recommendations:  No changes and encouraged to call if experiencing any fluid symptoms.   Follow-up plan: ICM clinic phone appointment on 03/09/2021.  91 day device clinic remote transmission 03/16/2021.     EP/Cardiology Office Visits:  Recall 03/19/2021 with Dr Caryl Comes.  03/24/2021 with Dr Haroldine Laws   Copy of ICM check sent to Dr. Caryl Comes.   3 month ICM trend: 02/02/2021.    12-14 Month ICM trend:     [name], RN 02/04/2021 1:47 PM

## 2021-02-11 ENCOUNTER — Telehealth: Payer: Self-pay | Admitting: Physical Medicine and Rehabilitation

## 2021-02-11 NOTE — Telephone Encounter (Signed)
Pt called requesting a call back from Dr. Ernestina Patches or Sunday Corn. Pt is asking for home health physical therapy. Please call pt at [phone].

## 2021-02-12 ENCOUNTER — Other Ambulatory Visit: Payer: Self-pay

## 2021-02-12 NOTE — Telephone Encounter (Signed)
Faxed referral to [facility]

## 2021-02-19 ENCOUNTER — Telehealth: Payer: Self-pay | Admitting: Orthopaedic Surgery

## 2021-02-19 NOTE — Telephone Encounter (Signed)
Called and advised.

## 2021-02-19 NOTE — Telephone Encounter (Signed)
Received call from Rockie Neighbours (PT) with [facility] needing verbal orders for HHPT 2 Wk 2 and 1 Wk 6 for strengthening,walking,balance and posture.  The number to contact Dorian is [phone]

## 2021-02-27 ENCOUNTER — Encounter (HOSPITAL_COMMUNITY): Payer: Medicare HMO | Admitting: Internal Medicine

## 2021-03-09 ENCOUNTER — Ambulatory Visit (INDEPENDENT_AMBULATORY_CARE_PROVIDER_SITE_OTHER): Payer: Medicare HMO

## 2021-03-09 DIAGNOSIS — Z950 Presence of cardiac pacemaker: Secondary | ICD-10-CM | POA: Diagnosis not present

## 2021-03-09 DIAGNOSIS — I5022 Chronic systolic (congestive) heart failure: Secondary | ICD-10-CM

## 2021-03-09 NOTE — Progress Notes (Signed)
EPIC Encounter for ICM Monitoring  Patient Name: Alexandra [name] is a 88 y.o. female Date: 03/09/2021 Primary Care Physican: Lawerance Cruel, MD Primary Cardiologist: Skains/Bensimhon Electrophysiologist: Vergie Living Pacing:  97%   02/04/2021 Weight: 162 lbs   Battery Longevity 4.6 months                                                                  Spoke with patient and heart failure questions reviewed.  Pt asymptomatic for fluid accumulation or dryness.     Corvue Thoracic impedance normal but was suggesting possible dryness starting 2/17.    Prescribed:  Furosemide 40 mg Take one tablet (40 mg) by mouth once every other day. May take extra as needed.   Potassium 10 mEq 1 tablet daily.   Labs: 01/29/2021 Creatinine 1.24, BUN 30, Potassium 4.2, Sodium 142, GFR 41 09/21/2020 Creatinine 1.79, BUN 40, Potassium 4.1, Sodium 135, GFR 26 A complete set of results can be found in Results Review.   Recommendations:   Advise to increase fluid intake x 2 days.     Follow-up plan: ICM clinic phone appointment on 03/16/2021 to recheck fluid levels.  91 day device clinic remote transmission 03/16/2021.     EP/Cardiology Office Visits:  Recall 03/19/2021 with Dr Caryl Comes.  03/24/2021 with Dr Haroldine Laws   Copy of ICM check sent to Dr. Caryl Comes.   3 month ICM trend: 03/09/2021.    12-14 Month ICM trend:     [name], RN 03/09/2021 2:14 PM

## 2021-03-16 ENCOUNTER — Ambulatory Visit (INDEPENDENT_AMBULATORY_CARE_PROVIDER_SITE_OTHER): Payer: Medicare HMO

## 2021-03-16 DIAGNOSIS — I447 Left bundle-branch block, unspecified: Secondary | ICD-10-CM

## 2021-03-17 ENCOUNTER — Telehealth: Payer: Self-pay

## 2021-03-17 ENCOUNTER — Ambulatory Visit (INDEPENDENT_AMBULATORY_CARE_PROVIDER_SITE_OTHER): Payer: Medicare HMO

## 2021-03-17 DIAGNOSIS — I5022 Chronic systolic (congestive) heart failure: Secondary | ICD-10-CM

## 2021-03-17 DIAGNOSIS — Z950 Presence of cardiac pacemaker: Secondary | ICD-10-CM

## 2021-03-17 LAB — CUP PACEART REMOTE DEVICE CHECK
Battery Remaining Longevity: 5 mo
Battery Remaining Percentage: 5 %
Battery Voltage: 2.75 V
Brady Statistic AP VP Percent: 1.4 %
Brady Statistic AP VS Percent: 1 %
Brady Statistic AS VP Percent: 95 %
Brady Statistic AS VS Percent: 3.3 %
Brady Statistic RA Percent Paced: 1.4 %
Date Time Interrogation Session: 20230228020013
Implantable Lead Implant Date: 20161229
Implantable Lead Implant Date: 20161229
Implantable Lead Implant Date: 20161229
Implantable Lead Location: 753858
Implantable Lead Location: 753859
Implantable Lead Location: 753860
Implantable Pulse Generator Implant Date: 20161229
Lead Channel Impedance Value: 390 Ohm
Lead Channel Impedance Value: 410 Ohm
Lead Channel Impedance Value: 980 Ohm
Lead Channel Pacing Threshold Amplitude: 0.625 V
Lead Channel Pacing Threshold Amplitude: 0.75 V
Lead Channel Pacing Threshold Amplitude: 0.75 V
Lead Channel Pacing Threshold Pulse Width: 0.5 ms
Lead Channel Pacing Threshold Pulse Width: 0.5 ms
Lead Channel Pacing Threshold Pulse Width: 1.5 ms
Lead Channel Sensing Intrinsic Amplitude: 1.7 mV
Lead Channel Sensing Intrinsic Amplitude: 12 mV
Lead Channel Setting Pacing Amplitude: 1.625
Lead Channel Setting Pacing Amplitude: 2 V
Lead Channel Setting Pacing Amplitude: 2 V
Lead Channel Setting Pacing Pulse Width: 0.5 ms
Lead Channel Setting Pacing Pulse Width: 1.5 ms
Lead Channel Setting Sensing Sensitivity: 2 mV
Pulse Gen Model: 3262
Pulse Gen Serial Number: 7834528

## 2021-03-17 NOTE — Telephone Encounter (Signed)
Remote ICM transmission received.  Attempted call to patient regarding ICM remote transmission and left detailed message per DPR.  Advised to return call for any fluid symptoms or questions. Next ICM remote transmission scheduled 04/13/2021.

## 2021-03-17 NOTE — Progress Notes (Signed)
EPIC Encounter for ICM Monitoring  Patient Name: Alexandra [name] is a 88 y.o. female Date: 03/17/2021 Primary Care Physican: Lawerance Cruel, MD Primary Cardiologist: Skains/Bensimhon Electrophysiologist: Vergie Living Pacing:  97%   02/04/2021 Weight: 162 lbs   Battery Longevity 4.5 months                                                                  Attempted call to patient and unable to reach.  Left detailed message per DPR regarding transmission. Transmission reviewed.    Corvue Thoracic impedance suggesting normal fluid levels.    Prescribed:  Furosemide 40 mg Take one tablet (40 mg) by mouth once every other day. May take extra as needed.   Potassium 10 mEq 1 tablet daily.   Labs: 01/29/2021 Creatinine 1.24, BUN 30, Potassium 4.2, Sodium 142, GFR 41 09/21/2020 Creatinine 1.79, BUN 40, Potassium 4.1, Sodium 135, GFR 26 A complete set of results can be found in Results Review.   Recommendations:   Left voice mail with ICM number and encouraged to call if experiencing any fluid symptoms.   Follow-up plan: ICM clinic phone appointment on 04/13/2021.  91 day device clinic remote transmission 06/16/2021.     EP/Cardiology Office Visits:  06/04/2021 with Dr Caryl Comes.  03/24/2021 with Dr Haroldine Laws   Copy of ICM check sent to Dr. Caryl Comes.   3 month ICM trend: 03/17/2021.    12-14 Month ICM trend:     [name], RN 03/17/2021 4:21 PM

## 2021-03-20 NOTE — Progress Notes (Signed)
Remote pacemaker transmission.   

## 2021-03-24 ENCOUNTER — Encounter (HOSPITAL_COMMUNITY): Payer: Self-pay | Admitting: Internal Medicine

## 2021-03-24 ENCOUNTER — Ambulatory Visit (HOSPITAL_BASED_OUTPATIENT_CLINIC_OR_DEPARTMENT_OTHER)
Admission: RE | Admit: 2021-03-24 | Discharge: 2021-03-24 | Disposition: A | Discharge status: Home / Self Care | Payer: Medicare HMO | Source: Ambulatory Visit | Attending: Internal Medicine | Admitting: Internal Medicine

## 2021-03-24 ENCOUNTER — Ambulatory Visit (HOSPITAL_COMMUNITY)
Admission: RE | Admit: 2021-03-24 | Discharge: 2021-03-24 | Disposition: A | Discharge status: Home / Self Care | Payer: Medicare HMO | Source: Ambulatory Visit | Attending: Internal Medicine | Admitting: Internal Medicine

## 2021-03-24 ENCOUNTER — Other Ambulatory Visit: Payer: Self-pay

## 2021-03-24 VITALS — BP 132/70 | HR 94 | Wt 168.0 lb

## 2021-03-24 DIAGNOSIS — I5022 Chronic systolic (congestive) heart failure: Secondary | ICD-10-CM | POA: Diagnosis not present

## 2021-03-24 DIAGNOSIS — I700 Atherosclerosis of aorta: Secondary | ICD-10-CM | POA: Diagnosis not present

## 2021-03-24 DIAGNOSIS — Z7901 Long term (current) use of anticoagulants: Secondary | ICD-10-CM | POA: Diagnosis not present

## 2021-03-24 DIAGNOSIS — Z86718 Personal history of other venous thrombosis and embolism: Secondary | ICD-10-CM | POA: Insufficient documentation

## 2021-03-24 DIAGNOSIS — Z95810 Presence of automatic (implantable) cardiac defibrillator: Secondary | ICD-10-CM

## 2021-03-24 DIAGNOSIS — Z86711 Personal history of pulmonary embolism: Secondary | ICD-10-CM | POA: Diagnosis not present

## 2021-03-24 DIAGNOSIS — I447 Left bundle-branch block, unspecified: Secondary | ICD-10-CM | POA: Insufficient documentation

## 2021-03-24 DIAGNOSIS — R262 Difficulty in walking, not elsewhere classified: Secondary | ICD-10-CM | POA: Insufficient documentation

## 2021-03-24 DIAGNOSIS — I10 Essential (primary) hypertension: Secondary | ICD-10-CM | POA: Diagnosis not present

## 2021-03-24 DIAGNOSIS — E039 Hypothyroidism, unspecified: Secondary | ICD-10-CM | POA: Diagnosis not present

## 2021-03-24 DIAGNOSIS — N1832 Chronic kidney disease, stage 3b: Secondary | ICD-10-CM | POA: Diagnosis not present

## 2021-03-24 DIAGNOSIS — I129 Hypertensive chronic kidney disease with stage 1 through stage 4 chronic kidney disease, or unspecified chronic kidney disease: Secondary | ICD-10-CM

## 2021-03-24 DIAGNOSIS — Z79899 Other long term (current) drug therapy: Secondary | ICD-10-CM | POA: Diagnosis not present

## 2021-03-24 DIAGNOSIS — M1990 Unspecified osteoarthritis, unspecified site: Secondary | ICD-10-CM | POA: Diagnosis not present

## 2021-03-24 DIAGNOSIS — I130 Hypertensive heart and chronic kidney disease with heart failure and stage 1 through stage 4 chronic kidney disease, or unspecified chronic kidney disease: Secondary | ICD-10-CM | POA: Diagnosis not present

## 2021-03-24 LAB — ECHOCARDIOGRAM COMPLETE
AR max vel: 1.94 cm2
AV Peak grad: 5.4 mmHg
Ao pk vel: 1.16 m/s
Area-P 1/2: 5.66 cm2
Calc EF: 58.5 %
S' Lateral: 2.4 cm
Single Plane A2C EF: 58 %
Single Plane A4C EF: 58.7 %

## 2021-03-24 NOTE — Progress Notes (Signed)
Patient ID: Alexandra [name], female   DOB: Oct 27, 1927, 88 y.o.   MRN: [id number]     Advanced Heart Failure Clinic Note   Primary Care: Dr. Melinda Crutch Primary Cardiologist: Dr Candee Furbish Primary HF: Dr Haroldine Laws  HPI: Alexandra [name] is a 88 y.o. 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 Discharge weight was 196.  She was admitted in July 2016 for Abdominal pain and SOB. Her Gallbladder was thought to be causing her pain, so a lap choley was performed that admission. Echo on 08/15/14 showed EF 20%. There were thoughts that she may have stress induced CMP due to the death of her husband in May 07, 2022 after several months of hospice care.    She was directly admitted to [facility] on 10/11/14 with concerns for low output with SBPs in 80s and tachycardia.  PICC line was placed with initial co-ox of 61%.CVP was 15. Milrinone started when co-ox dropped to 53%. She had SVT on milrinone 0.25 so started on amio. Developed acute SOB that improved quickly with cessation of amio and extra dose of IV lasix. Felt to possibly have acute amio toxicity. Milrinone decreased to 0.125 and no further PSVT. Sent home on milrinone 0.125. Diuresed well on 80 mg lasix IB BID. Discharge weight was 170.  Admitted 12/1 through 12/6 with PICC line infection. Bcx with [facility] 2/2 with for diptheroids. Bcx 1/2 at [facility]-Bacteremia-cornybacterium . PICC line replaced. She continued on milrinone 0.125 mcg.   S/p CRT-D placement 01/16/15 with Dr Caryl Comes.   Echo 2/17 EF ~40% Milrinone stopped 02/2015. Has had no major issues since.    Echo 7/20 EF 55%  She presents for follow up. Doing well. Has trouble walking due to arthritis. No CP or SOB. No edema. Due for generator change soon.   Echo today EF 55-60%  Personally reviewed    ICD interrogated personally: Fluid up and down. Looks good currently. 95% BIV pacing No VT Personally reviewed    RHC 7/17 RA = 1 RV = 25/2 PA = 29/9 (15) PCW = 6 Fick cardiac output/index = 5.9/3.3 PVR = 1.5 WU Ao sat = 98% PA sat = 68%, 70%, 71%  ECHO 10/02/14 LVEF 15% with diffuse hypokinesis, RV mildly dilated, normal function, PA peak pressure 59 mmHg Echo 2/17 EF 40-45% (I fetlt 35%)   Review of systems complete and found to be negative unless listed in HPI.   Past Medical History:  Diagnosis Date   Adult hypothyroidism 04/02/2014   Anemia of chronic disease 08/13/2014   Anxiety    Arthritis    Asthma    breast ca dx'd 2000   left   Bursitis    right hip   CAP (community acquired pneumonia) 12/19/2012   Chest pain    NM stress test 05/2011 Normal   CHF (congestive heart failure) ([place]) 08/19/2014   CKD (chronic kidney disease) stage 3, GFR 30-59 ml/min ([place]) 08/13/2014   Depression    DOE (dyspnea on exertion)    No SOB at rest   Essential (primary) hypertension 04/02/2014   Fatigue    excertional   JOITGPQD(826.4)    Hereditary and idiopathic peripheral neuropathy 07/02/2014   Hypercholesteremia    Intermittent LBBB (left bundle branch block) 12/21/2012  Leg weakness    Neuropathy    Obesity    Osteopenia    Peripheral neuropathy    Presence of permanent cardiac pacemaker 01/16/2015   BIV   Sacroiliitis, not elsewhere classified ([place])    Situational depression    Trigger finger    [name]    Current Outpatient Medications  Medication Sig Dispense Refill   albuterol (PROVENTIL HFA;VENTOLIN HFA) 108 (90 BASE) MCG/ACT inhaler Inhale 2 puffs into the lungs every 6 (six) hours as needed for wheezing or shortness of breath. Reported on 05/27/2015     ALPRAZolam (XANAX) 0.25 MG tablet Take 0.25 mg by mouth 3 (three) times daily as needed for anxiety. Reported on 05/27/2015     amoxicillin (AMOXIL) 500 MG capsule Take 4 capsules by  mouth as needed 1 hour before dental work 4 capsule prn   b complex vitamins tablet Take 1 tablet by mouth daily with lunch.      calcium carbonate (TUMS - DOSED IN MG ELEMENTAL CALCIUM) 500 MG chewable tablet Chew 2 tablets by mouth daily as needed for indigestion or heartburn. Reported on 07/18/2015     cholecalciferol (VITAMIN D) 1000 UNITS tablet Take 1,000 Units by mouth daily with lunch.      citalopram (CELEXA) 10 MG tablet Take 10 mg by mouth daily.     ELIQUIS 2.5 MG TABS tablet TAKE 1 TABLET(2.5 MG) BY MOUTH TWICE DAILY 60 tablet 11   famotidine (PEPCID) 20 MG tablet Take 20 mg by mouth daily.     ferrous sulfate 325 (65 FE) MG tablet Take 325 mg by mouth daily with breakfast.     furosemide (LASIX) 40 MG tablet Take one tablet (40 mg) by mouth once every other day. May take extra as needed. 30 tablet 3   hydrALAZINE (APRESOLINE) 25 MG tablet Take 1 tablet (25 mg total) by mouth 2 (two) times daily. 270 tablet 0   lactose free nutrition (BOOST) LIQD Take 237 mLs by mouth daily with breakfast.      levothyroxine (SYNTHROID, LEVOTHROID) 25 MCG tablet Take 25 mcg by mouth daily before breakfast. for thyroid     MAGNESIUM-OXIDE 400 (241.3 Mg) MG tablet TAKE 1/2 TABLET(200 MG) BY MOUTH DAILY 15 tablet 11   Multiple Vitamin (MULITIVITAMIN WITH MINERALS) TABS Take 1 tablet by mouth daily with lunch.      nitroGLYCERIN (NITROSTAT) 0.4 MG SL tablet Place 1 tablet (0.4 mg total) under the tongue every 5 (five) minutes as needed for chest pain. 25 tablet 4   oxyCODONE-acetaminophen (PERCOCET/ROXICET) 5-325 MG tablet Take 1 tablet by mouth every 8 (eight) hours as needed for severe pain. 30 tablet 0   Polyethyl Glycol-Propyl Glycol (SYSTANE OP) Place 1 drop into both eyes 2 (two) times daily as needed (dry eyes).     potassium chloride (K-DUR) 10 MEQ tablet TAKE 1 TABLET BY MOUTH DAILY 90 tablet 2   potassium chloride (MICRO-K) 10 MEQ CR capsule Take 10 mEq by mouth daily.     spironolactone  (ALDACTONE) 25 MG tablet Take 25 mg by mouth daily.     No current facility-administered medications for this encounter.    Allergies  Allergen Reactions   Amiodarone Shortness Of Breath and Nausea And Vomiting   Cymbalta [Duloxetine Hcl] Other (See Comments)    Depression   Lyrica [Pregabalin] Other (See Comments)    Blurry vision      Social History   Socioeconomic History   Marital status: Widowed    Spouse  name: Not on file   Number of children: 5   Years of education: HS   Highest education level: Not on file  Occupational History   Occupation: Retired  Tobacco Use   Smoking status: Never   Smokeless tobacco: Never  Vaping Use   Vaping Use: Never used  Substance and Sexual Activity   Alcohol use: No    Alcohol/week: 0.0 standard drinks   Drug use: No   Sexual activity: Never  Other Topics Concern   Not on file  Social History Narrative   Lives at home alone.   Right-handed.   Two cups caffeine daily (coffee).   Social Determinants of Health   Financial Resource Strain: Not on file  Food Insecurity: Not on file  Transportation Needs: Not on file  Physical Activity: Not on file  Stress: Not on file  Social Connections: Not on file  Intimate Partner Violence: Not on file      Family History  Problem Relation Age of Onset   Pneumonia Mother 53       cause of death   Diabetes Brother    Pancreatic cancer Father 50   Alzheimer's disease Sister    CVA Daughter        01/21/09   Stroke Daughter    Hypertension Daughter    Heart attack Neg Hx     Vitals:   03/24/21 1512  BP: 132/70  Pulse: 94  SpO2: 96%  Weight: 76.2 kg (168 lb)   Wt Readings from Last 3 Encounters:  03/24/21 76.2 kg (168 lb)  01/29/21 73.8 kg (162 lb 12.8 oz)  03/19/20 75.3 kg (166 lb)    PHYSICAL EXAM: General:  Well appearing. No resp difficulty HEENT: normal Neck: supple. no JVD. Carotids 2+ bilat; no bruits. No lymphadenopathy or thryomegaly appreciated. Cor: PMI  nondisplaced. Regular rate & rhythm. No rubs, gallops or murmurs. Lungs: clear Abdomen: soft, nontender, nondistended. No hepatosplenomegaly. No bruits or masses. Good bowel sounds. Extremities: no cyanosis, clubbing, rash, edema Neuro: alert & orientedx3, cranial nerves grossly intact. moves all 4 extremities w/o difficulty. Affect pleasant  ECG: A-sensed v-paced 94 Personally reviewed    ASSESSMENT & PLAN:  1. Chronic Systolic Heart failure - Echo 10/02/14 EF 15% Echo 2/17 read as 40-45% reviewed probably closer to 35%. Off milrinone since 2017. s/p St Jude CRT-D placement. 01/16/15 - Echo 11/2016 LVEF 45-50%, Grade 1 DD, Moderate AI - Echo 7/20 EF 55% (completely recovered with CRT) - Echo today 03/24/21. EF 55-60% Personally reviewed - Stable NYHA II - Volume status looks good on exam. Has been on lasix 40qod but recently held due to volume depletion. Follows with Sharman Cheek.  - ICD interrogated today. No VT/AF. Volume looks good. 99% bivpaced  - Continue hydralazine 25 mg BID - Continue spiro 12.5 mg daily - Previously failed b-blocker. - No ACE due to CKD.   2. H/o PE/ DVT bilateral by [place] 08/20/2014 - Continue apixaban 2.5 bid for long-term prevention of DVT per Amplify EXT trial. No bleeding 3. CKD Stage 3b - Last creatinine 1.6 in 2/20.  - Last creatinine 1.2 in 1/23 - Follows with Dr. Hollie Salk  4. Hx of LBBB - s/p CRT-D. Sees Dr. Caryl Comes 95% BiV pacing Due for gen change 5. Hypertension  - Blood pressure well controlled. Continue current regimen.     Glori Bickers, MD  3:33 PM

## 2021-03-24 NOTE — Patient Instructions (Signed)
Good to see you today ? ?No medication changes ? ?Your physician recommends that you schedule a follow-up appointment in: 1 year. Call our office in January for an appointment ? ?If you have any questions or concerns before your next appointment please send [place] a message through [place] or call our office at [phone].   ? ?TO LEAVE A MESSAGE FOR THE NURSE SELECT OPTION 2, PLEASE LEAVE A MESSAGE INCLUDING: ?YOUR NAME ?DATE OF BIRTH ?CALL BACK NUMBER ?REASON FOR CALL**this is important as we prioritize the call backs ? ?YOU WILL RECEIVE A CALL BACK THE SAME DAY AS LONG AS YOU CALL BEFORE 4:00 PM ? ?At the [facility], you and your health needs are our priority. As part of our continuing mission to provide you with exceptional heart care, we have created designated Provider Care Teams. These Care Teams include your primary Cardiologist (physician) and Advanced Practice Providers (APPs- Physician Assistants and Nurse Practitioners) who all work together to provide you with the care you need, when you need it.  ? ?You may see any of the following providers on your designated Care Team at your next follow up: ?Dr Glori Bickers ?Dr Loralie Champagne ?Darrick Grinder, NP ?Lyda Jester, PA ?Jessica Milford,NP ?Marlyce Huge, PA ?Audry Riles, PharmD ? ? ?Please be sure to bring in all your medications bottles to every appointment.  ? ? ?

## 2021-03-26 ENCOUNTER — Ambulatory Visit: Payer: Medicare HMO

## 2021-03-26 DIAGNOSIS — I428 Other cardiomyopathies: Secondary | ICD-10-CM

## 2021-03-26 DIAGNOSIS — I5022 Chronic systolic (congestive) heart failure: Secondary | ICD-10-CM

## 2021-03-27 LAB — CUP PACEART REMOTE DEVICE CHECK
Battery Remaining Longevity: 4 mo
Battery Remaining Percentage: 5 %
Battery Voltage: 2.75 V
Brady Statistic AP VP Percent: 1.4 %
Brady Statistic AP VS Percent: 1 %
Brady Statistic AS VP Percent: 95 %
Brady Statistic AS VS Percent: 3.3 %
Brady Statistic RA Percent Paced: 1.4 %
Date Time Interrogation Session: 20230310114948
Implantable Lead Implant Date: 20161229
Implantable Lead Implant Date: 20161229
Implantable Lead Implant Date: 20161229
Implantable Lead Location: 753858
Implantable Lead Location: 753859
Implantable Lead Location: 753860
Implantable Pulse Generator Implant Date: 20161229
Lead Channel Impedance Value: 400 Ohm
Lead Channel Impedance Value: 410 Ohm
Lead Channel Impedance Value: 960 Ohm
Lead Channel Pacing Threshold Amplitude: 0.75 V
Lead Channel Pacing Threshold Amplitude: 0.75 V
Lead Channel Pacing Threshold Amplitude: 0.875 V
Lead Channel Pacing Threshold Pulse Width: 0.5 ms
Lead Channel Pacing Threshold Pulse Width: 0.5 ms
Lead Channel Pacing Threshold Pulse Width: 1.5 ms
Lead Channel Sensing Intrinsic Amplitude: 12 mV
Lead Channel Sensing Intrinsic Amplitude: 2 mV
Lead Channel Setting Pacing Amplitude: 1.75 V
Lead Channel Setting Pacing Amplitude: 2 V
Lead Channel Setting Pacing Amplitude: 2 V
Lead Channel Setting Pacing Pulse Width: 0.5 ms
Lead Channel Setting Pacing Pulse Width: 1.5 ms
Lead Channel Setting Sensing Sensitivity: 2 mV
Pulse Gen Model: 3262
Pulse Gen Serial Number: 7834528

## 2021-04-03 DIAGNOSIS — E7800 Pure hypercholesterolemia, unspecified: Secondary | ICD-10-CM | POA: Diagnosis not present

## 2021-04-03 DIAGNOSIS — N184 Chronic kidney disease, stage 4 (severe): Secondary | ICD-10-CM | POA: Diagnosis not present

## 2021-04-03 DIAGNOSIS — E039 Hypothyroidism, unspecified: Secondary | ICD-10-CM | POA: Diagnosis not present

## 2021-04-03 DIAGNOSIS — I509 Heart failure, unspecified: Secondary | ICD-10-CM | POA: Diagnosis not present

## 2021-04-03 DIAGNOSIS — I10 Essential (primary) hypertension: Secondary | ICD-10-CM | POA: Diagnosis not present

## 2021-04-03 DIAGNOSIS — K219 Gastro-esophageal reflux disease without esophagitis: Secondary | ICD-10-CM | POA: Diagnosis not present

## 2021-04-08 DIAGNOSIS — N390 Urinary tract infection, site not specified: Secondary | ICD-10-CM | POA: Diagnosis not present

## 2021-04-08 DIAGNOSIS — I129 Hypertensive chronic kidney disease with stage 1 through stage 4 chronic kidney disease, or unspecified chronic kidney disease: Secondary | ICD-10-CM | POA: Diagnosis not present

## 2021-04-08 DIAGNOSIS — I82409 Acute embolism and thrombosis of unspecified deep veins of unspecified lower extremity: Secondary | ICD-10-CM | POA: Diagnosis not present

## 2021-04-08 DIAGNOSIS — I428 Other cardiomyopathies: Secondary | ICD-10-CM | POA: Diagnosis not present

## 2021-04-08 DIAGNOSIS — N1832 Chronic kidney disease, stage 3b: Secondary | ICD-10-CM | POA: Diagnosis not present

## 2021-04-08 DIAGNOSIS — E7800 Pure hypercholesterolemia, unspecified: Secondary | ICD-10-CM | POA: Diagnosis not present

## 2021-04-08 DIAGNOSIS — N184 Chronic kidney disease, stage 4 (severe): Secondary | ICD-10-CM | POA: Diagnosis not present

## 2021-04-08 NOTE — Progress Notes (Signed)
Remote pacemaker transmission.   

## 2021-04-11 ENCOUNTER — Other Ambulatory Visit (HOSPITAL_COMMUNITY): Payer: Self-pay | Admitting: Internal Medicine

## 2021-04-14 ENCOUNTER — Ambulatory Visit (INDEPENDENT_AMBULATORY_CARE_PROVIDER_SITE_OTHER): Payer: Medicare HMO

## 2021-04-14 DIAGNOSIS — I5022 Chronic systolic (congestive) heart failure: Secondary | ICD-10-CM | POA: Diagnosis not present

## 2021-04-14 DIAGNOSIS — Z95810 Presence of automatic (implantable) cardiac defibrillator: Secondary | ICD-10-CM | POA: Diagnosis not present

## 2021-04-14 NOTE — Progress Notes (Signed)
EPIC Encounter for ICM Monitoring ? ?Patient Name: Alexandra [name] is a 88 y.o. female ?Date: 04/14/2021 ?Primary Care Physican: Lawerance Cruel, MD ?Primary Cardiologist: Skains/Bensimhon ?Electrophysiologist: Caryl Comes ?Bi-V Pacing:  97%   ?02/04/2021 Weight: 162 lbs ?  ?Battery Longevity 3.6 months ?                                                               ?  ?Spoke with patient and heart failure questions reviewed.  Pt asymptomatic for fluid accumulation.   She reports food has been brought in for the last weeks since she is not cooking and she has not taken Furosemide in a week.  Asked about battery replacement and explained the process which will discussed in more detail with Dr Caryl Comes at 5/18 OV. ?  ?Corvue Thoracic impedance suggesting possible fluid accumulation starting 3/18.  ?  ?Prescribed:  ?Furosemide 40 mg Take one tablet (40 mg) by mouth once every other day. May take extra as needed.   ?Potassium 10 mEq 1 tablet daily. ?  ?Labs: ?01/29/2021 Creatinine 1.24, BUN 30, Potassium 4.2, Sodium 142, GFR 41 ?09/21/2020 Creatinine 1.79, BUN 40, Potassium 4.1, Sodium 135, GFR 26 ?A complete set of results can be found in Results Review. ?  ?Recommendations:   Advised to limit salt intake and restart taking prescribed Furosemide every other day.  ?  ?Follow-up plan: ICM clinic phone appointment on 04/22/2021 to recheck fluid levels.  91 day device clinic remote transmission 06/16/2021.   ?  ?EP/Cardiology Office Visits:  06/04/2021 with Dr Caryl Comes.  03/24/2021 with Dr Haroldine Laws ?  ?Copy of ICM check sent to Dr. Caryl Comes.  ? ?3 month ICM trend: 04/14/2021. ? ? ? ?12-14 Month ICM trend:  ? ? ? ?[name], RN ?04/14/2021 ?7:56 AM ? ?

## 2021-04-20 ENCOUNTER — Telehealth: Payer: Self-pay | Admitting: Orthopaedic Surgery

## 2021-04-20 NOTE — Telephone Encounter (Signed)
Lvm giving verbal ok 

## 2021-04-20 NOTE — Telephone Encounter (Signed)
Alexandra [name], PT w/ [facility] called asking to continue care for PT at 1x/wk for 6-8 weeks. ?

## 2021-04-22 ENCOUNTER — Ambulatory Visit (INDEPENDENT_AMBULATORY_CARE_PROVIDER_SITE_OTHER): Payer: Medicare HMO

## 2021-04-22 DIAGNOSIS — H02403 Unspecified ptosis of bilateral eyelids: Secondary | ICD-10-CM | POA: Diagnosis not present

## 2021-04-22 DIAGNOSIS — I5022 Chronic systolic (congestive) heart failure: Secondary | ICD-10-CM

## 2021-04-22 DIAGNOSIS — H10413 Chronic giant papillary conjunctivitis, bilateral: Secondary | ICD-10-CM | POA: Diagnosis not present

## 2021-04-22 DIAGNOSIS — G514 Facial myokymia: Secondary | ICD-10-CM | POA: Diagnosis not present

## 2021-04-22 DIAGNOSIS — H02831 Dermatochalasis of right upper eyelid: Secondary | ICD-10-CM | POA: Diagnosis not present

## 2021-04-22 DIAGNOSIS — Z95810 Presence of automatic (implantable) cardiac defibrillator: Secondary | ICD-10-CM

## 2021-04-22 DIAGNOSIS — Z961 Presence of intraocular lens: Secondary | ICD-10-CM | POA: Diagnosis not present

## 2021-04-22 DIAGNOSIS — H04123 Dry eye syndrome of bilateral lacrimal glands: Secondary | ICD-10-CM | POA: Diagnosis not present

## 2021-04-22 DIAGNOSIS — H02834 Dermatochalasis of left upper eyelid: Secondary | ICD-10-CM | POA: Diagnosis not present

## 2021-04-22 NOTE — Progress Notes (Signed)
EPIC Encounter for ICM Monitoring ? ?Patient Name: Alexandra [name] is a 88 y.o. female ?Date: 04/22/2021 ?Primary Care Physican: Lawerance Cruel, MD ?Primary Cardiologist: Skains/Bensimhon ?Electrophysiologist: Caryl Comes ?Bi-V Pacing:  97%   ?02/04/2021 Weight: 162 lbs ?04/22/2021 Weight: 168 lbs ?  ?Battery Longevity 3.3 months ?                                                               ?  ?Spoke with patient and heart failure questions reviewed.  Pt asymptomatic for fluid accumulation.  Reports feeling well at this time and voices no complaints.  ?  ?Corvue Thoracic impedance suggesting fluid levels returned close to normal  ?  ?Prescribed:  ?Furosemide 40 mg Take one tablet (40 mg) by mouth once every other day. May take extra as needed.   ?Potassium 10 mEq 1 tablet daily. ?  ?Labs: ?01/29/2021 Creatinine 1.24, BUN 30, Potassium 4.2, Sodium 142, GFR 41 ?09/21/2020 Creatinine 1.79, BUN 40, Potassium 4.1, Sodium 135, GFR 26 ?A complete set of results can be found in Results Review. ?  ?Recommendations:  No changes and encouraged to call if experiencing any fluid symptoms.  ?  ?Follow-up plan: ICM clinic phone appointment on 05/18/2021.  91 day device clinic remote transmission 06/16/2021.   ?  ?EP/Cardiology Office Visits:  06/04/2021 with Dr Caryl Comes.  Recall 03/25/2022 with Dr Haroldine Laws ?  ?Copy of ICM check sent to Dr. Caryl Comes.  ? ?3 month ICM trend: 04/22/2021. ? ? ? ?12-14 Month ICM trend:  ? ? ? ?[name], RN ?04/22/2021 ?8:29 AM ? ?

## 2021-04-27 ENCOUNTER — Ambulatory Visit (INDEPENDENT_AMBULATORY_CARE_PROVIDER_SITE_OTHER): Payer: Medicare HMO

## 2021-04-27 DIAGNOSIS — I5022 Chronic systolic (congestive) heart failure: Secondary | ICD-10-CM

## 2021-04-27 DIAGNOSIS — I428 Other cardiomyopathies: Secondary | ICD-10-CM

## 2021-04-28 LAB — CUP PACEART REMOTE DEVICE CHECK
Battery Remaining Longevity: 3 mo
Battery Remaining Percentage: 4 %
Battery Voltage: 2.72 V
Brady Statistic AP VP Percent: 1.4 %
Brady Statistic AP VS Percent: 1 %
Brady Statistic AS VP Percent: 95 %
Brady Statistic AS VS Percent: 3.4 %
Brady Statistic RA Percent Paced: 1.4 %
Date Time Interrogation Session: 20230410031759
Implantable Lead Implant Date: 20161229
Implantable Lead Implant Date: 20161229
Implantable Lead Implant Date: 20161229
Implantable Lead Location: 753858
Implantable Lead Location: 753859
Implantable Lead Location: 753860
Implantable Pulse Generator Implant Date: 20161229
Lead Channel Impedance Value: 1000 Ohm
Lead Channel Impedance Value: 410 Ohm
Lead Channel Impedance Value: 450 Ohm
Lead Channel Pacing Threshold Amplitude: 0.625 V
Lead Channel Pacing Threshold Amplitude: 0.75 V
Lead Channel Pacing Threshold Amplitude: 0.875 V
Lead Channel Pacing Threshold Pulse Width: 0.5 ms
Lead Channel Pacing Threshold Pulse Width: 0.5 ms
Lead Channel Pacing Threshold Pulse Width: 1.5 ms
Lead Channel Sensing Intrinsic Amplitude: 1.6 mV
Lead Channel Sensing Intrinsic Amplitude: 12 mV
Lead Channel Setting Pacing Amplitude: 1.625
Lead Channel Setting Pacing Amplitude: 2 V
Lead Channel Setting Pacing Amplitude: 2 V
Lead Channel Setting Pacing Pulse Width: 0.5 ms
Lead Channel Setting Pacing Pulse Width: 1.5 ms
Lead Channel Setting Sensing Sensitivity: 2 mV
Pulse Gen Model: 3262
Pulse Gen Serial Number: 7834528

## 2021-05-04 ENCOUNTER — Telehealth: Payer: Self-pay | Admitting: Physical Medicine and Rehabilitation

## 2021-05-04 NOTE — Telephone Encounter (Signed)
Patient called needing to schedule an appointment with Dr. Ernestina Patches for her knee and back. The number to contact patient is [phone] ?

## 2021-05-12 ENCOUNTER — Telehealth: Payer: Self-pay | Admitting: Physical Medicine and Rehabilitation

## 2021-05-12 NOTE — Telephone Encounter (Signed)
Pt called requesting to cancel appt. Please call pt to confirm at [phone]. ?

## 2021-05-14 NOTE — Progress Notes (Signed)
Remote pacemaker transmission.   

## 2021-05-14 NOTE — Addendum Note (Signed)
Addended by: Douglass Rivers D on: 05/14/2021 01:24 PM ? ? Modules accepted: Level of Service ? ?

## 2021-05-18 ENCOUNTER — Ambulatory Visit (INDEPENDENT_AMBULATORY_CARE_PROVIDER_SITE_OTHER): Payer: Medicare HMO

## 2021-05-18 DIAGNOSIS — I5022 Chronic systolic (congestive) heart failure: Secondary | ICD-10-CM

## 2021-05-18 DIAGNOSIS — Z95810 Presence of automatic (implantable) cardiac defibrillator: Secondary | ICD-10-CM

## 2021-05-21 NOTE — Progress Notes (Signed)
? ? ?Electrophysiology Office Note ?Date: 05/28/2021 ? ?ID:  Alexandra [name], DOB [birth year], MRN [id number] ? ?PCP: Lawerance Cruel, MD ?Primary Cardiologist: Glori Bickers, MD ?Electrophysiologist: Virl Axe, MD  ? ?CC: Pacemaker follow-up ? ?Alexandra [name] is a 88 y.o. female seen today for Virl Axe, MD for routine electrophysiology followup.  Since last being seen in our clinic the patient reports doing well.  she denies chest pain, palpitations, dyspnea, PND, orthopnea, nausea, vomiting, dizziness, syncope, edema, weight gain, or early satiety. ? ?Device History: ?St. Jude BiV PPM implanted 12/2014 for NICM/ LBBB ? ?Past Medical History:  ?Diagnosis Date  ? Adult hypothyroidism 04/02/2014  ? Anemia of chronic disease 08/13/2014  ? Anxiety   ? Arthritis   ? Asthma   ? breast ca dx'd 2000  ? left  ? Bursitis   ? right hip  ? CAP (community acquired pneumonia) 12/19/2012  ? Chest pain   ? NM stress test 05/2011 Normal  ? CHF (congestive heart failure) ([place]) 08/19/2014  ? CKD (chronic kidney disease) stage 3, GFR 30-59 ml/min ([facility]) 08/13/2014  ? Depression   ? DOE (dyspnea on exertion)   ? No SOB at rest  ? Essential (primary) hypertension 04/02/2014  ? Fatigue   ? excertional  ? [id number](478.2)   ? Hereditary and idiopathic peripheral neuropathy 07/02/2014  ? Hypercholesteremia   ? Intermittent LBBB (left bundle branch block) 12/21/2012  ? Leg weakness   ? Neuropathy   ? Obesity   ? Osteopenia   ? Peripheral neuropathy   ? Presence of permanent cardiac pacemaker 01/16/2015  ? BIV  ? Sacroiliitis, not elsewhere classified ([place])   ? Situational depression   ? Trigger finger   ? [name]  ? ?Past Surgical History:  ?Procedure Laterality Date  ? ACHILLES TENDON SURGERY    ? BREAST LUMPECTOMY    ? breast cancer  ? CARDIAC CATHETERIZATION N/A 07/21/2015  ? Procedure: Right Heart Cath;  Surgeon: Jolaine Artist, MD;  Location: [facility];  Service: Cardiovascular;  Laterality: N/A;  ?  CHOLECYSTECTOMY N/A 08/13/2014  ? Procedure: LAPAROSCOPIC CHOLECYSTECTOMY;  Surgeon: Stark Klein, MD;  Location: [facility];  Service: General;  Laterality: N/A;  ? EP IMPLANTABLE DEVICE N/A 01/16/2015  ? Procedure: BiV Pacemaker Insertion CRT-P;  Surgeon: Deboraha Sprang, MD;  Location: [facility];  Service: Cardiovascular;  Laterality: N/A;  ? LEG SURGERY    ? LUMBAR LAMINECTOMY    ? POLYPECTOMY    ? TONSILLECTOMY    ? VESICOVAGINAL FISTULA CLOSURE W/ TAH    ? DC hysterectomy  ? ? ?Current Outpatient Medications  ?Medication Sig Dispense Refill  ? albuterol (PROVENTIL HFA;VENTOLIN HFA) 108 (90 BASE) MCG/ACT inhaler Inhale 2 puffs into the lungs every 6 (six) hours as needed for wheezing or shortness of breath. Reported on 05/27/2015    ? ALPRAZolam (XANAX) 0.25 MG tablet Take 0.25 mg by mouth 3 (three) times daily as needed for anxiety. Reported on 05/27/2015    ? amoxicillin (AMOXIL) 500 MG capsule Take 4 capsules by mouth as needed 1 hour before dental work 4 capsule prn  ? b complex vitamins tablet Take 1 tablet by mouth daily with lunch.     ? calcium carbonate (TUMS - DOSED IN MG ELEMENTAL CALCIUM) 500 MG chewable tablet Chew 2 tablets by mouth daily as needed for indigestion or heartburn. Reported on 07/18/2015    ? cholecalciferol (VITAMIN D) 1000 UNITS tablet Take 1,000 Units by  mouth daily with lunch.     ? citalopram (CELEXA) 10 MG tablet Take 10 mg by mouth daily.    ? ELIQUIS 2.5 MG TABS tablet TAKE 1 TABLET(2.5 MG) BY MOUTH TWICE DAILY 60 tablet 11  ? famotidine (PEPCID) 20 MG tablet Take 20 mg by mouth daily.    ? ferrous sulfate 325 (65 FE) MG tablet Take 325 mg by mouth daily with breakfast.    ? furosemide (LASIX) 40 MG tablet Take one tablet (40 mg) by mouth once every other day. May take extra as needed. 30 tablet 3  ? hydrALAZINE (APRESOLINE) 25 MG tablet Take 1 tablet (25 mg total) by mouth 2 (two) times daily. 270 tablet 0  ? lactose free nutrition (BOOST) LIQD Take 237 mLs by mouth daily with  breakfast.     ? levothyroxine (SYNTHROID, LEVOTHROID) 25 MCG tablet Take 25 mcg by mouth daily before breakfast. for thyroid    ? MAGNESIUM-OXIDE 400 (241.3 Mg) MG tablet TAKE 1/2 TABLET(200 MG) BY MOUTH DAILY 15 tablet 11  ? Multiple Vitamin (MULITIVITAMIN WITH MINERALS) TABS Take 1 tablet by mouth daily with lunch.     ? nitroGLYCERIN (NITROSTAT) 0.4 MG SL tablet Place 1 tablet (0.4 mg total) under the tongue every 5 (five) minutes as needed for chest pain. 25 tablet 4  ? oxyCODONE-acetaminophen (PERCOCET/ROXICET) 5-325 MG tablet Take 1 tablet by mouth every 8 (eight) hours as needed for severe pain. 30 tablet 0  ? Polyethyl Glycol-Propyl Glycol (SYSTANE OP) Place 1 drop into both eyes 2 (two) times daily as needed (dry eyes).    ? potassium chloride (K-DUR) 10 MEQ tablet TAKE 1 TABLET BY MOUTH DAILY 90 tablet 2  ? potassium chloride (MICRO-K) 10 MEQ CR capsule Take 10 mEq by mouth daily.    ? spironolactone (ALDACTONE) 25 MG tablet Take 25 mg by mouth daily.    ? ?No current facility-administered medications for this visit.  ? ? ?Allergies:   Amiodarone, Cymbalta [duloxetine hcl], and Lyrica [pregabalin]  ? ?Social History: ?Social History  ? ?Socioeconomic History  ? Marital status: Widowed  ?  Spouse name: Not on file  ? Number of children: 5  ? Years of education: HS  ? Highest education level: Not on file  ?Occupational History  ? Occupation: Retired  ?Tobacco Use  ? Smoking status: Never  ? Smokeless tobacco: Never  ?Vaping Use  ? Vaping Use: Never used  ?Substance and Sexual Activity  ? Alcohol use: No  ?  Alcohol/week: 0.0 standard drinks  ? Drug use: No  ? Sexual activity: Never  ?Other Topics Concern  ? Not on file  ?Social History Narrative  ? Lives at home alone.  ? Right-handed.  ? Two cups caffeine daily (coffee).  ? ?Social Determinants of Health  ? ?Financial Resource Strain: Not on file  ?Food Insecurity: Not on file  ?Transportation Needs: Not on file  ?Physical Activity: Not on file  ?Stress:  Not on file  ?Social Connections: Not on file  ?Intimate Partner Violence: Not on file  ? ? ?Family History: ?Family History  ?Problem Relation Age of Onset  ? Pneumonia Mother 39  ?     cause of death  ? Diabetes Brother   ? Pancreatic cancer Father 41  ? Alzheimer's disease Sister   ? CVA Daughter   ?     01/21/09  ? Stroke Daughter   ? Hypertension Daughter   ? Heart attack Neg Hx   ? ? ? ?  Review of Systems: ?All other systems reviewed and are otherwise negative except as noted above. ? ?Physical Exam: ?Vitals:  ? 05/28/21 1131  ?BP: (!) 112/58  ?Pulse: 88  ?SpO2: 98%  ?Weight: 166 lb 6.4 oz (75.5 kg)  ?Height: '5\' 4"'$  (1.626 m)  ?  ? ?GEN- The patient is well appearing, alert and oriented x 3 today.   ?HEENT: normocephalic, atraumatic; sclera clear, conjunctiva pink; hearing intact; oropharynx clear; neck supple  ?Lungs- Clear to ausculation bilaterally, normal work of breathing.  No wheezes, rales, rhonchi ?Heart- Regular rate and rhythm, no murmurs, rubs or gallops  ?GI- soft, non-tender, non-distended, bowel sounds present  ?Extremities- no clubbing or cyanosis. No edema ?MS- no significant deformity or atrophy ?Skin- warm and dry, no rash or lesion; PPM pocket well healed ?Psych- euthymic mood, full affect ?Neuro- strength and sensation are intact ? ?PPM Interrogation- reviewed in detail today,  See PACEART report ? ?EKG:  EKG is not ordered today. ?Personal review of ekg ordered  03/24/2021  shows AS VP at 94 bpm, QRS 132 ms  ? ?Recent Labs: ?09/21/2020: ALT 13 ?01/29/2021: B Natriuretic Peptide 49.5; BUN 30; Creatinine, Ser 1.24; Hemoglobin 12.3; Platelets 193; Potassium 4.2; Sodium 142  ? ?Wt Readings from Last 3 Encounters:  ?05/28/21 166 lb 6.4 oz (75.5 kg)  ?03/24/21 168 lb (76.2 kg)  ?01/29/21 162 lb 12.8 oz (73.8 kg)  ?  ? ?Other studies Reviewed: ?Additional studies/ records that were reviewed today include: Previous EP office notes, Previous remote checks, Most recent labwork.  ? ?Assessment and Plan: ? ?1.   Chronic CHF and LBBB  s/p St. Jude BiV PPM  ?Normal PPM function ?See Claudia Desanctis Art report ?No changes today ?Approx 1-3 months to ERI.  ?Explained risks, benefits, and alternatives to PPM generator change, includin

## 2021-05-22 NOTE — Progress Notes (Signed)
EPIC Encounter for ICM Monitoring ? ?Patient Name: Alexandra [name] is a 88 y.o. female ?Date: 05/22/2021 ?Primary Care Physican: Lawerance Cruel, MD ?Primary Cardiologist: Skains/Bensimhon ?Electrophysiologist: Caryl Comes ?Bi-V Pacing:  97%   ?02/04/2021 Weight: 162 lbs ?04/22/2021 Weight: 168 lbs ?  ?Battery Longevity <3 months ?                                                               ?  ?Spoke with patient and heart failure questions reviewed.  Pt asymptomatic for fluid accumulation.  Reports feeling well at this time and voices no complaints.  ?  ?Corvue Thoracic impedance suggesting normal fluid levels.  ?  ?Prescribed:  ?Furosemide 40 mg Take one tablet (40 mg) by mouth once every other day. May take extra as needed.   ?Potassium 10 mEq 1 tablet daily. ?  ?Labs: ?01/29/2021 Creatinine 1.24, BUN 30, Potassium 4.2, Sodium 142, GFR 41 ?09/21/2020 Creatinine 1.79, BUN 40, Potassium 4.1, Sodium 135, GFR 26 ?A complete set of results can be found in Results Review. ?  ?Recommendations:  No changes and encouraged to call if experiencing any fluid symptoms.  ?  ?Follow-up plan: ICM clinic phone appointment on 06/22/2021.  91 day device clinic remote transmission 06/16/2021.   ?  ?EP/Cardiology Office Visits:  05/28/2021 with Oda Kilts, PA.   Recall 03/25/2022 with Dr Haroldine Laws ?  ?Copy of ICM check sent to Dr. Caryl Comes.  ? ?3 month ICM trend: 05/18/2021. ? ? ? ?12-14 Month ICM trend:  ? ? ? ?[name], RN ?05/22/2021 ?4:50 PM ? ?

## 2021-05-28 ENCOUNTER — Ambulatory Visit: Payer: Medicare HMO | Admitting: Student

## 2021-05-28 ENCOUNTER — Encounter: Payer: Self-pay | Admitting: Student

## 2021-05-28 VITALS — BP 112/58 | HR 88 | Ht 64.0 in | Wt 166.4 lb

## 2021-05-28 DIAGNOSIS — I5022 Chronic systolic (congestive) heart failure: Secondary | ICD-10-CM

## 2021-05-28 DIAGNOSIS — I447 Left bundle-branch block, unspecified: Secondary | ICD-10-CM

## 2021-05-28 DIAGNOSIS — I428 Other cardiomyopathies: Secondary | ICD-10-CM | POA: Diagnosis not present

## 2021-05-28 LAB — CUP PACEART INCLINIC DEVICE CHECK
Battery Remaining Longevity: 2 mo
Battery Voltage: 2.69 V
Brady Statistic RA Percent Paced: 1.3 %
Brady Statistic RV Percent Paced: 97 %
Date Time Interrogation Session: 20230511124935
Implantable Lead Implant Date: 20161229
Implantable Lead Implant Date: 20161229
Implantable Lead Implant Date: 20161229
Implantable Lead Location: 753858
Implantable Lead Location: 753859
Implantable Lead Location: 753860
Implantable Pulse Generator Implant Date: 20161229
Lead Channel Impedance Value: 412.5 Ohm
Lead Channel Impedance Value: 425 Ohm
Lead Channel Impedance Value: 925 Ohm
Lead Channel Pacing Threshold Amplitude: 0.5 V
Lead Channel Pacing Threshold Amplitude: 0.5 V
Lead Channel Pacing Threshold Amplitude: 0.625 V
Lead Channel Pacing Threshold Amplitude: 0.75 V
Lead Channel Pacing Threshold Pulse Width: 0.5 ms
Lead Channel Pacing Threshold Pulse Width: 0.5 ms
Lead Channel Pacing Threshold Pulse Width: 1.5 ms
Lead Channel Pacing Threshold Pulse Width: 1.5 ms
Lead Channel Sensing Intrinsic Amplitude: 1.9 mV
Lead Channel Sensing Intrinsic Amplitude: 12 mV
Lead Channel Setting Pacing Amplitude: 1.625
Lead Channel Setting Pacing Amplitude: 2 V
Lead Channel Setting Pacing Amplitude: 2 V
Lead Channel Setting Pacing Pulse Width: 0.5 ms
Lead Channel Setting Pacing Pulse Width: 1.5 ms
Lead Channel Setting Sensing Sensitivity: 2 mV
Pulse Gen Model: 3262
Pulse Gen Serial Number: 7834528

## 2021-05-28 NOTE — Patient Instructions (Signed)
Medication Instructions:  Your physician recommends that you continue on your current medications as directed. Please refer to the Current Medication list given to you today.  *If you need a refill on your cardiac medications before your next appointment, please call your pharmacy*   Lab Work: None  If you have labs (blood work) drawn today and your tests are completely normal, you will receive your results only by: MyChart Message (if you have MyChart) OR A paper copy in the mail If you have any lab test that is abnormal or we need to change your treatment, we will call you to review the results.   Follow-Up: At [facility], you and your health needs are our priority.  As part of our continuing mission to provide you with exceptional heart care, we have created designated Provider Care Teams.  These Care Teams include your primary Cardiologist (physician) and Advanced Practice Providers (APPs -  Physician Assistants and Nurse Practitioners) who all work together to provide you with the care you need, when you need it.   Your next appointment:   6 month(s)  The format for your next appointment:   In Person  Provider:   Steven Klein, MD{ 

## 2021-05-29 ENCOUNTER — Encounter: Payer: Medicare HMO | Admitting: Student

## 2021-06-02 ENCOUNTER — Encounter: Payer: Medicare HMO | Admitting: Physical Medicine and Rehabilitation

## 2021-06-04 ENCOUNTER — Encounter: Payer: Medicare HMO | Admitting: Internal Medicine

## 2021-06-11 ENCOUNTER — Other Ambulatory Visit: Payer: Self-pay | Admitting: Physical Medicine and Rehabilitation

## 2021-06-11 ENCOUNTER — Telehealth: Payer: Self-pay | Admitting: Physical Medicine and Rehabilitation

## 2021-06-11 DIAGNOSIS — M5416 Radiculopathy, lumbar region: Secondary | ICD-10-CM

## 2021-06-11 NOTE — Telephone Encounter (Signed)
Patient called needing an appointment with Dr. Ernestina Patches for her back and knees. The number to contact patient is [phone]

## 2021-06-16 ENCOUNTER — Ambulatory Visit (INDEPENDENT_AMBULATORY_CARE_PROVIDER_SITE_OTHER): Payer: Medicare HMO

## 2021-06-16 DIAGNOSIS — I5022 Chronic systolic (congestive) heart failure: Secondary | ICD-10-CM | POA: Diagnosis not present

## 2021-06-16 DIAGNOSIS — I428 Other cardiomyopathies: Secondary | ICD-10-CM

## 2021-06-18 LAB — CUP PACEART REMOTE DEVICE CHECK
Battery Remaining Longevity: 1 mo
Battery Remaining Percentage: 1 %
Battery Voltage: 2.66 V
Brady Statistic AP VP Percent: 1.2 %
Brady Statistic AP VS Percent: 1 %
Brady Statistic AS VP Percent: 94 %
Brady Statistic AS VS Percent: 4.4 %
Brady Statistic RA Percent Paced: 1.1 %
Date Time Interrogation Session: 20230531113253
Implantable Lead Implant Date: 20161229
Implantable Lead Implant Date: 20161229
Implantable Lead Implant Date: 20161229
Implantable Lead Location: 753858
Implantable Lead Location: 753859
Implantable Lead Location: 753860
Implantable Pulse Generator Implant Date: 20161229
Lead Channel Impedance Value: 400 Ohm
Lead Channel Impedance Value: 410 Ohm
Lead Channel Impedance Value: 930 Ohm
Lead Channel Pacing Threshold Amplitude: 0.5 V
Lead Channel Pacing Threshold Amplitude: 0.625 V
Lead Channel Pacing Threshold Amplitude: 0.875 V
Lead Channel Pacing Threshold Pulse Width: 0.5 ms
Lead Channel Pacing Threshold Pulse Width: 0.5 ms
Lead Channel Pacing Threshold Pulse Width: 1.5 ms
Lead Channel Sensing Intrinsic Amplitude: 1.1 mV
Lead Channel Sensing Intrinsic Amplitude: 12 mV
Lead Channel Setting Pacing Amplitude: 1.625
Lead Channel Setting Pacing Amplitude: 2 V
Lead Channel Setting Pacing Amplitude: 2 V
Lead Channel Setting Pacing Pulse Width: 0.5 ms
Lead Channel Setting Pacing Pulse Width: 1.5 ms
Lead Channel Setting Sensing Sensitivity: 2 mV
Pulse Gen Model: 3262
Pulse Gen Serial Number: 7834528

## 2021-06-22 ENCOUNTER — Ambulatory Visit (INDEPENDENT_AMBULATORY_CARE_PROVIDER_SITE_OTHER): Payer: Medicare HMO

## 2021-06-22 DIAGNOSIS — Z95810 Presence of automatic (implantable) cardiac defibrillator: Secondary | ICD-10-CM | POA: Diagnosis not present

## 2021-06-22 DIAGNOSIS — I5022 Chronic systolic (congestive) heart failure: Secondary | ICD-10-CM | POA: Diagnosis not present

## 2021-06-25 NOTE — Progress Notes (Signed)
EPIC Encounter for ICM Monitoring  Patient Name: Alexandra [name] is a 88 y.o. female Date: 06/25/2021 Primary Care Physican: Lawerance Cruel, MD Primary Cardiologist: Skains/Bensimhon Electrophysiologist: Vergie Living Pacing:  95%   02/04/2021 Weight: 162 lbs 04/22/2021 Weight: 168 lbs   Battery Longevity <3 months                                                                  Spoke with patient and heart failure questions reviewed.  Pt asymptomatic for fluid accumulation.  Reports feeling well at this time and voices no complaints.    Corvue Thoracic impedance suggesting normal fluid levels.    Prescribed:  Furosemide 40 mg Take one tablet (40 mg) by mouth once every other day. May take extra as needed.   Potassium 10 mEq 1 tablet daily.   Labs: 01/29/2021 Creatinine 1.24, BUN 30, Potassium 4.2, Sodium 142, GFR 41 09/21/2020 Creatinine 1.79, BUN 40, Potassium 4.1, Sodium 135, GFR 26 A complete set of results can be found in Results Review.   Recommendations:  No changes and encouraged to call if experiencing any fluid symptoms.    Follow-up plan: ICM clinic phone appointment on 07/27/2021.  91 day device clinic remote transmission 09/14/2021.     EP/Cardiology Office Visits:  11/19/2021 with Dr Caryl Comes.   Recall 03/25/2022 with Dr Haroldine Laws   Copy of ICM check sent to Dr. Caryl Comes.    3 month ICM trend: 06/22/2021.    12-14 Month ICM trend:     [name], RN 06/25/2021 2:46 PM

## 2021-06-29 NOTE — Progress Notes (Signed)
Remote pacemaker transmission.   

## 2021-07-27 ENCOUNTER — Ambulatory Visit (INDEPENDENT_AMBULATORY_CARE_PROVIDER_SITE_OTHER): Payer: Medicare HMO

## 2021-07-27 DIAGNOSIS — Z95810 Presence of automatic (implantable) cardiac defibrillator: Secondary | ICD-10-CM | POA: Diagnosis not present

## 2021-07-27 DIAGNOSIS — I5022 Chronic systolic (congestive) heart failure: Secondary | ICD-10-CM | POA: Diagnosis not present

## 2021-07-30 ENCOUNTER — Ambulatory Visit (INDEPENDENT_AMBULATORY_CARE_PROVIDER_SITE_OTHER): Payer: Medicare HMO

## 2021-07-30 DIAGNOSIS — I428 Other cardiomyopathies: Secondary | ICD-10-CM

## 2021-07-30 DIAGNOSIS — I5022 Chronic systolic (congestive) heart failure: Secondary | ICD-10-CM

## 2021-07-30 NOTE — Progress Notes (Signed)
EPIC Encounter for ICM Monitoring  Patient Name: Alexandra [name] is a 88 y.o. female Date: 07/30/2021 Primary Care Physican: Lawerance Cruel, MD Primary Cardiologist: Skains/Bensimhon Electrophysiologist: Vergie Living Pacing:  96%   02/04/2021 Weight: 162 lbs 04/22/2021 Weight: 168 lbs   Battery Longevity <3 months                                                                  Spoke with patient and heart failure questions reviewed.  Pt asymptomatic for fluid accumulation.  Reports feeling well at this time and voices no complaints.    Corvue Thoracic impedance suggesting normal fluid levels.    Prescribed:  Furosemide 40 mg Take one tablet (40 mg) by mouth once every other day. May take extra as needed.   Potassium 10 mEq 1 tablet daily.   Labs: 01/29/2021 Creatinine 1.24, BUN 30, Potassium 4.2, Sodium 142, GFR 41 09/21/2020 Creatinine 1.79, BUN 40, Potassium 4.1, Sodium 135, GFR 26 A complete set of results can be found in Results Review.   Recommendations:  No changes and encouraged to call if experiencing any fluid symptoms.    Follow-up plan: ICM clinic phone appointment on 08/31/2021.  91 day device clinic remote transmission 09/14/2021.     EP/Cardiology Office Visits:  11/19/2021 with Dr Caryl Comes.   Recall 03/25/2022 with Dr Haroldine Laws   Copy of ICM check sent to Dr. Caryl Comes.    3 month ICM trend: 07/27/2021.    12-14 Month ICM trend:     [name], RN 07/30/2021 2:07 PM

## 2021-08-03 ENCOUNTER — Telehealth: Payer: Self-pay

## 2021-08-03 DIAGNOSIS — I5022 Chronic systolic (congestive) heart failure: Secondary | ICD-10-CM

## 2021-08-03 DIAGNOSIS — I429 Cardiomyopathy, unspecified: Secondary | ICD-10-CM

## 2021-08-03 DIAGNOSIS — Z01812 Encounter for preprocedural laboratory examination: Secondary | ICD-10-CM

## 2021-08-03 LAB — CUP PACEART REMOTE DEVICE CHECK
Battery Remaining Longevity: 1 mo
Battery Remaining Percentage: 0.5 %
Battery Voltage: 2.62 V
Brady Statistic AP VP Percent: 1 %
Brady Statistic AP VS Percent: 1 %
Brady Statistic AS VP Percent: 95 %
Brady Statistic AS VS Percent: 3.6 %
Brady Statistic RA Percent Paced: 1 %
Date Time Interrogation Session: 20230714174215
Implantable Lead Implant Date: 20161229
Implantable Lead Implant Date: 20161229
Implantable Lead Implant Date: 20161229
Implantable Lead Location: 753858
Implantable Lead Location: 753859
Implantable Lead Location: 753860
Implantable Pulse Generator Implant Date: 20161229
Lead Channel Impedance Value: 410 Ohm
Lead Channel Impedance Value: 410 Ohm
Lead Channel Impedance Value: 960 Ohm
Lead Channel Pacing Threshold Amplitude: 0.5 V
Lead Channel Pacing Threshold Amplitude: 0.625 V
Lead Channel Pacing Threshold Amplitude: 0.75 V
Lead Channel Pacing Threshold Pulse Width: 0.5 ms
Lead Channel Pacing Threshold Pulse Width: 0.5 ms
Lead Channel Pacing Threshold Pulse Width: 1.5 ms
Lead Channel Sensing Intrinsic Amplitude: 1.9 mV
Lead Channel Sensing Intrinsic Amplitude: 12 mV
Lead Channel Setting Pacing Amplitude: 1.625
Lead Channel Setting Pacing Amplitude: 2 V
Lead Channel Setting Pacing Amplitude: 2 V
Lead Channel Setting Pacing Pulse Width: 0.5 ms
Lead Channel Setting Pacing Pulse Width: 1.5 ms
Lead Channel Setting Sensing Sensitivity: 2 mV
Pulse Gen Model: 3262
Pulse Gen Serial Number: 7834528

## 2021-08-03 NOTE — Telephone Encounter (Signed)
Attempted to call daughter to advise device ERI.

## 2021-08-03 NOTE — Telephone Encounter (Signed)
Scheduled remote reviewed. Normal device function.   Device has reached ERI 2.60V (ERI 2.62V)  Pt with recent OV with AT (05/28/2021).  Per recent note:  Explained risks, benefits, and alternatives to PPM generator change, including but not limited to bleeding, infection, pneumothorax, pericardial effusion, lead dislodgement, heart attack, stroke, or death.  Pt verbalized understanding and agrees to proceed at indicated time.    If ERI met within 6 months, she does not need an office visit as the procedure was discussed in detail with pt and her son today.   Will forward to covering nurse to schedule gen change.

## 2021-08-04 NOTE — Telephone Encounter (Signed)
Spoke with pt and advised of PPM battery ar recommended replacement time.  Pt verbalizes understanding and agrees to generator change on 08/14/2021.  Pt advised once procedure is scheduled will contact pt with instructions.  Pt verbalizes understanding and agrees with current plan.

## 2021-08-04 NOTE — Telephone Encounter (Signed)
Spoke with pt and reviewed Device Instruction Letter.  See letter for complete details.  Pt will pick up a hard copy and surgical scrub when she comes for labs on 08/07/2021.  Pt verbalizes understanding and agrees with current plan.

## 2021-08-07 ENCOUNTER — Other Ambulatory Visit: Payer: Medicare HMO

## 2021-08-07 DIAGNOSIS — I429 Cardiomyopathy, unspecified: Secondary | ICD-10-CM

## 2021-08-07 DIAGNOSIS — I5022 Chronic systolic (congestive) heart failure: Secondary | ICD-10-CM

## 2021-08-07 DIAGNOSIS — Z01812 Encounter for preprocedural laboratory examination: Secondary | ICD-10-CM | POA: Diagnosis not present

## 2021-08-07 NOTE — Telephone Encounter (Signed)
Spoke with pt who states she has sent a friend to the office to pick up letter and scrub.  Pt thanked Therapist, sports for the call back.

## 2021-08-07 NOTE — Telephone Encounter (Signed)
Pt states she forgot to pick up the surgical scrub when she came in for labs today. She is wondering if her son can come and pick it up and if so where does he need to go to pick it up? Please advise.

## 2021-08-08 LAB — CBC
Hematocrit: 38 % (ref 34.0–46.6)
Hemoglobin: 12.9 g/dL (ref 11.1–15.9)
MCH: 32.7 pg (ref 26.6–33.0)
MCHC: 33.9 g/dL (ref 31.5–35.7)
MCV: 96 fL (ref 79–97)
Platelets: 181 10*3/uL (ref 150–450)
RBC: 3.95 x10E6/uL (ref 3.77–5.28)
RDW: 11.9 % (ref 11.7–15.4)
WBC: 8.9 10*3/uL (ref 3.4–10.8)

## 2021-08-08 LAB — BASIC METABOLIC PANEL
BUN/Creatinine Ratio: 23 (ref 12–28)
BUN: 36 mg/dL (ref 10–36)
CO2: 25 mmol/L (ref 20–29)
Calcium: 9.9 mg/dL (ref 8.7–10.3)
Chloride: 99 mmol/L (ref 96–106)
Creatinine, Ser: 1.55 mg/dL — ABNORMAL HIGH (ref 0.57–1.00)
Glucose: 87 mg/dL (ref 70–99)
Potassium: 4.7 mmol/L (ref 3.5–5.2)
Sodium: 140 mmol/L (ref 134–144)
eGFR: 31 mL/min/{1.73_m2} — ABNORMAL LOW (ref >59)

## 2021-08-13 NOTE — Pre-Procedure Instructions (Signed)
Instructed patient on the following items: Arrival time 0930 Nothing to eat or drink after midnight No meds AM of procedure Responsible person to drive you home and stay with you for 24 hrs Wash with special soap night before and morning of procedure If on anti-coagulant drug instructions Eliquis- stopped it yesterday

## 2021-08-14 ENCOUNTER — Other Ambulatory Visit: Payer: Self-pay

## 2021-08-14 ENCOUNTER — Encounter (HOSPITAL_COMMUNITY)
Admission: RE | Disposition: A | Discharge status: Home / Self Care | Payer: Self-pay | Source: Home / Self Care | Attending: Internal Medicine

## 2021-08-14 ENCOUNTER — Ambulatory Visit (HOSPITAL_COMMUNITY)
Admission: RE | Admit: 2021-08-14 | Discharge: 2021-08-14 | Disposition: A | Discharge status: Home / Self Care | Payer: Medicare HMO | Attending: Internal Medicine | Admitting: Internal Medicine

## 2021-08-14 DIAGNOSIS — N183 Chronic kidney disease, stage 3 unspecified: Secondary | ICD-10-CM | POA: Insufficient documentation

## 2021-08-14 DIAGNOSIS — Z79899 Other long term (current) drug therapy: Secondary | ICD-10-CM | POA: Diagnosis not present

## 2021-08-14 DIAGNOSIS — Z45010 Encounter for checking and testing of cardiac pacemaker pulse generator [battery]: Secondary | ICD-10-CM | POA: Insufficient documentation

## 2021-08-14 DIAGNOSIS — I428 Other cardiomyopathies: Secondary | ICD-10-CM | POA: Insufficient documentation

## 2021-08-14 DIAGNOSIS — I5032 Chronic diastolic (congestive) heart failure: Secondary | ICD-10-CM | POA: Insufficient documentation

## 2021-08-14 DIAGNOSIS — I130 Hypertensive heart and chronic kidney disease with heart failure and stage 1 through stage 4 chronic kidney disease, or unspecified chronic kidney disease: Secondary | ICD-10-CM | POA: Insufficient documentation

## 2021-08-14 DIAGNOSIS — I471 Supraventricular tachycardia: Secondary | ICD-10-CM | POA: Diagnosis not present

## 2021-08-14 DIAGNOSIS — Z7901 Long term (current) use of anticoagulants: Secondary | ICD-10-CM | POA: Insufficient documentation

## 2021-08-14 HISTORY — PX: BIV PACEMAKER GENERATOR CHANGEOUT: EP1198

## 2021-08-14 SURGERY — BIV PACEMAKER GENERATOR CHANGEOUT

## 2021-08-14 MED ORDER — CEFAZOLIN SODIUM-DEXTROSE 2-4 GM/100ML-% IV SOLN
INTRAVENOUS | Status: AC
  Filled 2021-08-14: qty 100

## 2021-08-14 MED ORDER — SODIUM CHLORIDE 0.9 % IV SOLN: INTRAVENOUS | Status: AC

## 2021-08-14 MED ORDER — FENTANYL CITRATE (PF) 100 MCG/2ML IJ SOLN
INTRAMUSCULAR | Status: DC | PRN
  Administered 2021-08-14: 12.5 ug via INTRAVENOUS

## 2021-08-14 MED ORDER — ACETAMINOPHEN 325 MG PO TABS: 325.0000 mg | ORAL_TABLET | ORAL | Status: DC | PRN

## 2021-08-14 MED ORDER — MIDAZOLAM HCL 5 MG/5ML IJ SOLN
INTRAMUSCULAR | Status: DC | PRN
  Administered 2021-08-14: .5 mg via INTRAVENOUS

## 2021-08-14 MED ORDER — GENTAMICIN SULFATE 40 MG/ML IJ SOLN
INTRAMUSCULAR | Status: AC
  Filled 2021-08-14: qty 2

## 2021-08-14 MED ORDER — CEFAZOLIN SODIUM-DEXTROSE 2-4 GM/100ML-% IV SOLN
2.0000 g | INTRAVENOUS | Status: AC
  Administered 2021-08-14: 2 g via INTRAVENOUS

## 2021-08-14 MED ORDER — POVIDONE-IODINE 10 % EX SWAB
2.0000 | Freq: Once | CUTANEOUS | Status: AC
Start: 2021-08-14 — End: 2021-08-14
  Administered 2021-08-14: 2 via TOPICAL

## 2021-08-14 MED ORDER — LIDOCAINE HCL (PF) 1 % IJ SOLN
INTRAMUSCULAR | Status: DC | PRN
  Administered 2021-08-14: 60 mL

## 2021-08-14 MED ORDER — LIDOCAINE HCL 1 % IJ SOLN
INTRAMUSCULAR | Status: AC
Start: 2021-08-14 — End: ?
  Filled 2021-08-14: qty 60

## 2021-08-14 MED ORDER — FENTANYL CITRATE (PF) 100 MCG/2ML IJ SOLN
INTRAMUSCULAR | Status: AC
  Filled 2021-08-14: qty 2

## 2021-08-14 MED ORDER — CHLORHEXIDINE GLUCONATE 4 % EX LIQD
4.0000 | Freq: Once | CUTANEOUS | Status: AC
  Administered 2021-08-14: 4 via TOPICAL
  Filled 2021-08-14: qty 60

## 2021-08-14 MED ORDER — SODIUM CHLORIDE 0.9 % IV SOLN
80.0000 mg | INTRAVENOUS | Status: AC
  Administered 2021-08-14: 80 mg

## 2021-08-14 MED ORDER — SODIUM CHLORIDE 0.9 % IV SOLN: INTRAVENOUS | Status: DC

## 2021-08-14 MED ORDER — MIDAZOLAM HCL 5 MG/5ML IJ SOLN
INTRAMUSCULAR | Status: AC
  Filled 2021-08-14: qty 5

## 2021-08-14 SURGICAL SUPPLY — 5 items
CABLE SURGICAL S-101-97-12 (CABLE) ×3 IMPLANT
PACEMAKER QUDR ALLR CRT PM3562 (Pacemaker) IMPLANT
PAD DEFIB RADIO PHYSIO CONN (PAD) ×3 IMPLANT
PMKR QUADRA ALLURE CRT PM3562 (Pacemaker) ×2 IMPLANT
TRAY PACEMAKER INSERTION (PACKS) ×3 IMPLANT

## 2021-08-14 NOTE — Discharge Instructions (Signed)

## 2021-08-14 NOTE — Addendum Note (Signed)
Addended by: Douglass Rivers D on: 08/14/2021 05:06 PM   Modules accepted: Level of Service

## 2021-08-14 NOTE — H&P (Signed)
Patient Care Team: Lawerance Cruel, MD as PCP - General (Family Medicine) Deboraha Sprang, MD as PCP - Electrophysiology (Cardiology) Bensimhon, Shaune Pascal, MD as PCP - Cardiology (Cardiology)   HPI  Alexandra [name] is a 88 y.o. female admitted for CRT-P device generator replacement  Developed heart failure ? TakoTsuboOriginally implanted 2016 in setting of milrinone supported heart failure NICM, with subsequent improvement in LV fun  (See Below)   No edema. Occ dyspnea on exertion--   Hx of DVT/Pulmonary Embolism 8/16 -long term prophylaxis started 2017 with decision to maintain  DATE TEST EF    9/16 Echo    15 %    2/17 Echo  40-45 %    11/18 Echo  45-50%    7/20 Echo  55%    3/23 Echo  55-60%         Date Cr K TSH Hgb  1/18 1.5 3.8   12.2  11/18 1.46 4.4      5/19 1.41 4.8       7/20 1..79 4.0   13.4  12/20 1.6>>1.5 4.0      5/21 1.69 4.1 1.25 13.0  7/23 1.55 4.7  12.9      Past Medical History:  Diagnosis Date   Adult hypothyroidism 04/02/2014   Anemia of chronic disease 08/13/2014   Anxiety    Arthritis    Asthma    breast ca dx'd 2000   left   Bursitis    right hip   CAP (community acquired pneumonia) 12/19/2012   Chest pain    NM stress test 05/2011 Normal   CHF (congestive heart failure) ([place]) 08/19/2014   CKD (chronic kidney disease) stage 3, GFR 30-59 ml/min (HCC) 08/13/2014   Depression    DOE (dyspnea on exertion)    No SOB at rest   Essential (primary) hypertension 04/02/2014   Fatigue    excertional   WYOVZCHY(850.2)    Hereditary and idiopathic peripheral neuropathy 07/02/2014   Hypercholesteremia    Intermittent LBBB (left bundle branch block) 12/21/2012   Leg weakness    Neuropathy    Obesity    Osteopenia    Peripheral neuropathy    Presence of permanent cardiac pacemaker 01/16/2015   BIV   Sacroiliitis, not elsewhere classified ([place])    Situational depression    Trigger finger    [name]    Past Surgical  History:  Procedure Laterality Date   ACHILLES TENDON SURGERY     BREAST LUMPECTOMY     breast cancer   CARDIAC CATHETERIZATION N/A 07/21/2015   Procedure: Right Heart Cath;  Surgeon: Jolaine Artist, MD;  Location: [facility];  Service: Cardiovascular;  Laterality: N/A;   CHOLECYSTECTOMY N/A 08/13/2014   Procedure: LAPAROSCOPIC CHOLECYSTECTOMY;  Surgeon: Stark Warrene Kapfer, MD;  Location: [facility];  Service: General;  Laterality: N/A;   EP IMPLANTABLE DEVICE N/A 01/16/2015   Procedure: BiV Pacemaker Insertion CRT-P;  Surgeon: Deboraha Sprang, MD;  Location: [facility];  Service: Cardiovascular;  Laterality: N/A;   LEG SURGERY     LUMBAR LAMINECTOMY     POLYPECTOMY     TONSILLECTOMY     VESICOVAGINAL FISTULA CLOSURE W/ TAH     DC hysterectomy    Current Facility-Administered Medications  Medication Dose Route Frequency Provider Last Rate Last Admin   0.9 %  sodium chloride infusion   Intravenous Continuous Deboraha Sprang, MD 50 mL/hr at 08/14/21 7741 New Bag  at 08/14/21 0953   ceFAZolin (ANCEF) IVPB 2g/100 mL premix  2 g Intravenous On Call Deboraha Sprang, MD       gentamicin (GARAMYCIN) 80 mg in sodium chloride 0.9 % 500 mL irrigation  80 mg Irrigation On Call Deboraha Sprang, MD        Allergies  Allergen Reactions   Amiodarone Shortness Of Breath and Nausea And Vomiting   Cymbalta [Duloxetine Hcl] Other (See Comments)    Depression   Lyrica [Pregabalin] Other (See Comments)    Blurry vision      Social History   Tobacco Use   Smoking status: Never   Smokeless tobacco: Never  Vaping Use   Vaping Use: Never used  Substance Use Topics   Alcohol use: No    Alcohol/week: 0.0 standard drinks of alcohol   Drug use: No     Family History  Problem Relation Age of Onset   Pneumonia Mother 43       cause of death   Diabetes Brother    Pancreatic cancer Father 60   Alzheimer's disease Sister    CVA Daughter        01/21/09   Stroke Daughter    Hypertension  Daughter    Heart attack Neg Hx      Current Meds  Medication Sig   amoxicillin (AMOXIL) 500 MG capsule Take 4 capsules by mouth as needed 1 hour before dental work   b complex vitamins tablet Take 1 tablet by mouth daily with lunch.    cholecalciferol (VITAMIN D) 1000 UNITS tablet Take 1,000 Units by mouth daily with lunch.    citalopram (CELEXA) 10 MG tablet Take 10 mg by mouth daily.   ELIQUIS 2.5 MG TABS tablet TAKE 1 TABLET(2.5 MG) BY MOUTH TWICE DAILY   famotidine (PEPCID) 20 MG tablet Take 20 mg by mouth daily.   ferrous sulfate 325 (65 FE) MG tablet Take 325 mg by mouth daily with breakfast.   furosemide (LASIX) 40 MG tablet Take one tablet (40 mg) by mouth once every other day. May take extra as needed.   hydrALAZINE (APRESOLINE) 25 MG tablet Take 1 tablet (25 mg total) by mouth 2 (two) times daily.   lactose free nutrition (BOOST) LIQD Take 237 mLs by mouth daily with breakfast.    levothyroxine (SYNTHROID, LEVOTHROID) 25 MCG tablet Take 25 mcg by mouth daily before breakfast. for thyroid   MAGNESIUM-OXIDE 400 (241.3 Mg) MG tablet TAKE 1/2 TABLET(200 MG) BY MOUTH DAILY   Multiple Vitamin (MULITIVITAMIN WITH MINERALS) TABS Take 1 tablet by mouth daily with lunch.    nitroGLYCERIN (NITROSTAT) 0.4 MG SL tablet Place 1 tablet (0.4 mg total) under the tongue every 5 (five) minutes as needed for chest pain.   oxyCODONE-acetaminophen (PERCOCET/ROXICET) 5-325 MG tablet Take 1 tablet by mouth every 8 (eight) hours as needed for severe pain.   Polyethyl Glycol-Propyl Glycol (SYSTANE OP) Place 1 drop into both eyes 2 (two) times daily.   potassium chloride (K-DUR) 10 MEQ tablet TAKE 1 TABLET BY MOUTH DAILY   spironolactone (ALDACTONE) 25 MG tablet Take 25 mg by mouth daily.     Review of Systems negative except from HPI and PMH  Physical Exam BP 131/66   Pulse 87   Temp 98.4 F (36.9 C) (Oral)   Resp 17   Ht '5\' 4"'$  (1.626 m)   Wt 74.8 kg   SpO2 96%   BMI 28.32 kg/m  Well  developed and well nourished in  no acute distress HENT normal E scleral and icterus clear Neck Supple JVP flat;  Clear to ausculation Device pocket well healed; without hematoma or erythema.  There is no tethering Regular rate and rhythm, no murmurs gallops or rub Soft with active bowel sounds No clubbing cyanosis  Edema Alert and oriented, grossly normal motor and sensory function Skin Warm and Dry    Assessment and  Plan  Nonischemic cardiomyopathy   Congestive heart failure-chronic-diastolic   ICD-CRT-D-St. Jude's      Renal dysfunction gd 3-4 GFR 26 (creatinine 1.69 on 5/24   Atrial Tachycardia   High Risk Medication Surveillance Aldactone  DVT/PR 2016 on long term anticoagulation     We have reviewed the benefits and risks of generator replacement.  These include but are not limited to lead fracture and infection.  The patient understands, agrees and is willing to proceed.

## 2021-08-14 NOTE — Progress Notes (Signed)
Remote pacemaker transmission.   

## 2021-08-17 ENCOUNTER — Encounter (HOSPITAL_COMMUNITY): Payer: Self-pay | Admitting: Internal Medicine

## 2021-08-26 ENCOUNTER — Ambulatory Visit (INDEPENDENT_AMBULATORY_CARE_PROVIDER_SITE_OTHER): Payer: Medicare HMO

## 2021-08-26 ENCOUNTER — Encounter: Payer: Self-pay | Admitting: Physician Assistant

## 2021-08-26 ENCOUNTER — Ambulatory Visit: Payer: Medicare HMO | Admitting: Physician Assistant

## 2021-08-26 ENCOUNTER — Ambulatory Visit: Payer: Self-pay

## 2021-08-26 DIAGNOSIS — G8929 Other chronic pain: Secondary | ICD-10-CM | POA: Diagnosis not present

## 2021-08-26 DIAGNOSIS — M545 Low back pain, unspecified: Secondary | ICD-10-CM

## 2021-08-26 DIAGNOSIS — I447 Left bundle-branch block, unspecified: Secondary | ICD-10-CM

## 2021-08-26 DIAGNOSIS — I429 Cardiomyopathy, unspecified: Secondary | ICD-10-CM | POA: Diagnosis not present

## 2021-08-26 DIAGNOSIS — M25562 Pain in left knee: Secondary | ICD-10-CM | POA: Diagnosis not present

## 2021-08-26 DIAGNOSIS — M25561 Pain in right knee: Secondary | ICD-10-CM | POA: Diagnosis not present

## 2021-08-26 LAB — CUP PACEART INCLINIC DEVICE CHECK
Battery Remaining Longevity: 94 mo
Battery Voltage: 3.11 V
Brady Statistic RA Percent Paced: 0.15 %
Brady Statistic RV Percent Paced: 99.42 %
Date Time Interrogation Session: 20230809140348
Implantable Lead Implant Date: 20161229
Implantable Lead Implant Date: 20161229
Implantable Lead Implant Date: 20161229
Implantable Lead Location: 753858
Implantable Lead Location: 753859
Implantable Lead Location: 753860
Implantable Pulse Generator Implant Date: 20230728
Lead Channel Impedance Value: 387.5 Ohm
Lead Channel Impedance Value: 400 Ohm
Lead Channel Impedance Value: 950 Ohm
Lead Channel Pacing Threshold Amplitude: 0.75 V
Lead Channel Pacing Threshold Amplitude: 0.75 V
Lead Channel Pacing Threshold Amplitude: 0.75 V
Lead Channel Pacing Threshold Amplitude: 0.75 V
Lead Channel Pacing Threshold Amplitude: 1 V
Lead Channel Pacing Threshold Amplitude: 1 V
Lead Channel Pacing Threshold Pulse Width: 0.5 ms
Lead Channel Pacing Threshold Pulse Width: 0.5 ms
Lead Channel Pacing Threshold Pulse Width: 0.5 ms
Lead Channel Pacing Threshold Pulse Width: 0.5 ms
Lead Channel Pacing Threshold Pulse Width: 0.5 ms
Lead Channel Pacing Threshold Pulse Width: 0.5 ms
Lead Channel Sensing Intrinsic Amplitude: 1.8 mV
Lead Channel Sensing Intrinsic Amplitude: 12 mV
Lead Channel Setting Pacing Amplitude: 1.625
Lead Channel Setting Pacing Amplitude: 2 V
Lead Channel Setting Pacing Amplitude: 2 V
Lead Channel Setting Pacing Pulse Width: 0.5 ms
Lead Channel Setting Pacing Pulse Width: 0.5 ms
Lead Channel Setting Sensing Sensitivity: 2 mV
Pulse Gen Model: 3562
Pulse Gen Serial Number: 8101642

## 2021-08-26 MED ORDER — LIDOCAINE HCL 1 % IJ SOLN
4.0000 mL | INTRAMUSCULAR | Status: AC | PRN
  Administered 2021-08-26: 4 mL

## 2021-08-26 MED ORDER — METHYLPREDNISOLONE ACETATE 40 MG/ML IJ SUSP
40.0000 mg | INTRAMUSCULAR | Status: AC | PRN
  Administered 2021-08-26: 40 mg via INTRA_ARTICULAR

## 2021-08-26 NOTE — Progress Notes (Signed)
Wound check appointment. Dermabond removed. Wound without redness or edema. Incision edges approximated, wound well healed. Normal device function. Thresholds, sensing, and impedances consistent with implant measurements. Device outputs programmed at chronic values. Histogram distribution appropriate for patient and level of activity. No mode switches or high ventricular rates noted. Patient educated about wound care, arm mobility, lifting restrictions. ROV in 3 months with implanting physician.

## 2021-08-26 NOTE — Progress Notes (Signed)
Office Visit Note   Patient: Alexandra [name]           Date of Birth: [birth year]           MRN: [id number] Visit Date: 08/26/2021              Requested by: Lawerance Cruel, [place],  [place] [address] PCP: Lawerance Cruel, MD   Assessment & Plan: Visit Diagnoses:  1. Chronic pain of left knee   2. Chronic pain of right knee   3. Lumbar pain     Plan: She would like to repeat epidural steroid injection for her low back pain.  She has had prior epidural steroid injections with Dr. Ernestina Patches and would like for him to repeat the injection as it gave her complete relief for about 9 months.  She is now having some mild low back pain but does not want to get an appointment as she was at prior to the epidural steroid injection.  Regards to her knee she understands to wait least 3 months between injections.  We could repeat the supplemental injections if she would like for [place] to perform these we can order them otherwise she will follow-up as needed.  Follow-Up Instructions: Return if symptoms worsen or fail to improve.   Orders:  Orders Placed This Encounter  Procedures   Large Joint Inj: bilateral knee   XR Knee 1-2 Views Left   XR Knee 1-2 Views Right   No orders of the defined types were placed in this encounter.     Procedures: Large Joint Inj: bilateral knee on 08/26/2021 11:14 AM Indications: pain Details: 22 G 1.5 in needle, anterolateral approach  Arthrogram: No  Medications (Right): 4 mL lidocaine 1 %; 40 mg methylPREDNISolone acetate 40 MG/ML Aspirate (Right): 16 mL yellow Medications (Left): 4 mL lidocaine 1 %; 40 mg methylPREDNISolone acetate 40 MG/ML Aspirate (Left): 19 mL yellow Outcome: tolerated well, no immediate complications Procedure, treatment alternatives, risks and benefits explained, specific risks discussed. Consent was given by the patient. Immediately prior to procedure a time out was called to verify the correct patient,  procedure, equipment, support staff and site/side marked as required. Patient was prepped and draped in the usual sterile fashion.       Clinical Data: No additional findings.   Subjective: Chief Complaint  Patient presents with   Left Knee - Pain   Right Knee - Pain    HPI [name] comes in today with bilateral knee pain.  She states she has had no falls or injuries.  She is using a cane to ambulate.  She does have pain in both knees with ambulating.  She feels like her right knee is swollen.  Notes the pain in her knees began a couple months ago.  She is also asking about a repeat epidural steroid injection for her lumbar spine.  She states the last injection lasted for 9 to 10 months.  She does have tingling in her legs at night but primarily just having some low back pain and is not as tense as it was.  She denies any bowel bladder dysfunction saddle anesthesia .does have some waking pain due to her back.  She does have back pain that awakens her.   Review of Systems See HPI otherwise negative or noncontributory.  Objective: Vital Signs: There were no vitals taken for this visit.  Physical Exam General: Well-developed well-nourished female in no acute distress is able to  ambulate about the room without a walker and get on and off the exam table with minimal assistance. Ortho Exam Bilateral knees: Good range of motion of both knees.  No abnormal warmth or erythema.  Slight effusion in both knees.  Tello femoral crepitus both knees. Specialty Comments:  No specialty comments available.  Imaging: XR Knee 1-2 Views Right  Result Date: 08/26/2021 Right knee: Tricompartmental arthritis.  Near bone-on-bone lateral compartment.  Moderate arthritis medial compartment and severe patellofemoral arthritis.  No acute fractures.  Knee is well located.  No bony lesions.    PMFS History: Patient Active Problem List   Diagnosis Date Noted   Non-ischemic cardiomyopathy ([place]) 03/18/2020    Atrial tachycardia ([place]) 03/18/2020   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 03/18/2020   Unilateral primary osteoarthritis, right knee 07/04/2017   Dyspnea    Chronic systolic heart failure (HCC)    Pain in left shin 02/07/2015   Leukocytosis 40/10/2723   Chronic systolic CHF (congestive heart failure), NYHA class 3 ([place]) 01/16/2015   Protein-calorie malnutrition, severe ([place]) 01/15/2015   Infection due to Corynebacterium diphtheriae 12/24/2014   Bacteremia    Fever 36/64/4034   Chronic systolic CHF (congestive heart failure), NYHA class 4 ([place]) 10/23/2014   Situational depression    Frequent headaches    Pulmonary embolism ([place]) 10/10/2014   Chronic anticoagulation 10/10/2014   Takotsubo syndrome 08/19/2014   HCAP (healthcare-associated pneumonia) 08/17/2014   H/O Asthma 08/16/2014   LBBB (left bundle branch block) 08/16/2014   Cardiomyopathy- etiology not yet determined 08/16/2014   PSVT (paroxysmal supraventricular tachycardia) ([place]) 08/16/2014   CKD (chronic kidney disease) stage 3, GFR 30-59 ml/min (HCC) 08/13/2014   Anemia of chronic disease 08/13/2014   Hypothyroidism 08/13/2014   Acute cholecystitis 08/11/2014   Abdominal pain 08/11/2014   Essential (primary) hypertension 04/02/2014   Past Medical History:  Diagnosis Date   Adult hypothyroidism 04/02/2014   Anemia of chronic disease 08/13/2014   Anxiety    Arthritis    Asthma    breast ca dx'd 2000   left   Bursitis    right hip   CAP (community acquired pneumonia) 12/19/2012   Chest pain    NM stress test 05/2011 Normal   CHF (congestive heart failure) ([place]) 08/19/2014   CKD (chronic kidney disease) stage 3, GFR 30-59 ml/min (HCC) 08/13/2014   Depression    DOE (dyspnea on exertion)    No SOB at rest   Essential (primary) hypertension 04/02/2014   Fatigue    excertional   VQQVZDGL(875.6)    Hereditary and idiopathic peripheral neuropathy 07/02/2014   Hypercholesteremia     Intermittent LBBB (left bundle branch block) 12/21/2012   Leg weakness    Neuropathy    Obesity    Osteopenia    Peripheral neuropathy    Presence of permanent cardiac pacemaker 01/16/2015   BIV   Sacroiliitis, not elsewhere classified ([place])    Situational depression    Trigger finger    [name]    Family History  Problem Relation Age of Onset   Pneumonia Mother 68       cause of death   Diabetes Brother    Pancreatic cancer Father 59   Alzheimer's disease Sister    CVA Daughter        01/21/09   Stroke Daughter    Hypertension Daughter    Heart attack Neg Hx     Past Surgical History:  Procedure Laterality Date   ACHILLES TENDON  SURGERY     BIV PACEMAKER GENERATOR CHANGEOUT N/A 08/14/2021   Procedure: BIV PACEMAKER GENERATOR CHANGEOUT;  Surgeon: Deboraha Sprang, MD;  Location: [facility];  Service: Cardiovascular;  Laterality: N/A;   BREAST LUMPECTOMY     breast cancer   CARDIAC CATHETERIZATION N/A 07/21/2015   Procedure: Right Heart Cath;  Surgeon: Jolaine Artist, MD;  Location: [facility];  Service: Cardiovascular;  Laterality: N/A;   CHOLECYSTECTOMY N/A 08/13/2014   Procedure: LAPAROSCOPIC CHOLECYSTECTOMY;  Surgeon: Stark Klein, MD;  Location: [facility];  Service: General;  Laterality: N/A;   EP IMPLANTABLE DEVICE N/A 01/16/2015   Procedure: BiV Pacemaker Insertion CRT-P;  Surgeon: Deboraha Sprang, MD;  Location: [facility];  Service: Cardiovascular;  Laterality: N/A;   LEG SURGERY     LUMBAR LAMINECTOMY     POLYPECTOMY     TONSILLECTOMY     VESICOVAGINAL FISTULA CLOSURE W/ TAH     DC hysterectomy   Social History   Occupational History   Occupation: Retired  Tobacco Use   Smoking status: Never   Smokeless tobacco: Never  Vaping Use   Vaping Use: Never used  Substance and Sexual Activity   Alcohol use: No    Alcohol/week: 0.0 standard drinks of alcohol   Drug use: No   Sexual activity: Never

## 2021-08-26 NOTE — Patient Instructions (Signed)
   After Your Pacemaker   Monitor your pacemaker site for redness, swelling, and drainage. Call the device clinic at [phone] if you experience these symptoms or fever/chills.  Your incision was closed with Dermabond:  You may shower 1 day after your defibrillator implant and wash your incision with soap and water. Avoid lotions, ointments, or perfumes over your incision until it is well-healed.  You may use a hot tub or a pool after your wound check appointment if the incision is completely closed.  You may drive, unless driving has been restricted by your healthcare providers.  Remote monitoring is used to monitor your pacemaker from home. This monitoring is scheduled every 91 days by our office. It allows us to keep an eye on the functioning of your device to ensure it is working properly. You will routinely see your Electrophysiologist annually (more often if necessary).  

## 2021-08-27 NOTE — Addendum Note (Signed)
Addended by: Robyne Peers on: 08/27/2021 08:15 AM   Modules accepted: Orders

## 2021-08-31 ENCOUNTER — Ambulatory Visit (INDEPENDENT_AMBULATORY_CARE_PROVIDER_SITE_OTHER): Payer: Medicare HMO

## 2021-08-31 DIAGNOSIS — I5022 Chronic systolic (congestive) heart failure: Secondary | ICD-10-CM

## 2021-08-31 DIAGNOSIS — Z95810 Presence of automatic (implantable) cardiac defibrillator: Secondary | ICD-10-CM

## 2021-09-01 DIAGNOSIS — R531 Weakness: Secondary | ICD-10-CM | POA: Diagnosis not present

## 2021-09-01 DIAGNOSIS — M179 Osteoarthritis of knee, unspecified: Secondary | ICD-10-CM | POA: Diagnosis not present

## 2021-09-01 DIAGNOSIS — E559 Vitamin D deficiency, unspecified: Secondary | ICD-10-CM | POA: Diagnosis not present

## 2021-09-01 DIAGNOSIS — E7800 Pure hypercholesterolemia, unspecified: Secondary | ICD-10-CM | POA: Diagnosis not present

## 2021-09-01 DIAGNOSIS — Z0000 Encounter for general adult medical examination without abnormal findings: Secondary | ICD-10-CM | POA: Diagnosis not present

## 2021-09-01 DIAGNOSIS — E039 Hypothyroidism, unspecified: Secondary | ICD-10-CM | POA: Diagnosis not present

## 2021-09-01 DIAGNOSIS — I10 Essential (primary) hypertension: Secondary | ICD-10-CM | POA: Diagnosis not present

## 2021-09-01 DIAGNOSIS — Z23 Encounter for immunization: Secondary | ICD-10-CM | POA: Diagnosis not present

## 2021-09-01 DIAGNOSIS — N184 Chronic kidney disease, stage 4 (severe): Secondary | ICD-10-CM | POA: Diagnosis not present

## 2021-09-03 NOTE — Progress Notes (Signed)
EPIC Encounter for ICM Monitoring  Patient Name: Alexandra [name] is a 88 y.o. female Date: 09/03/2021 Primary Care Physican: Lawerance Cruel, MD Primary Cardiologist: Skains/Bensimhon Electrophysiologist: Vergie Living Pacing:  96%   02/04/2021 Weight: 162 lbs 04/22/2021 Weight: 168 lbs   Battery Longevity <3 months                                                                  Transmission reviewed and fluid level baseline is still developing after 7/28 battery replacement.    Corvue Thoracic impedance still developing baseline.    Prescribed:  Furosemide 40 mg Take one tablet (40 mg) by mouth once every other day. May take extra as needed.   Potassium 10 mEq 1 tablet daily.   Labs: 01/29/2021 Creatinine 1.24, BUN 30, Potassium 4.2, Sodium 142, GFR 41 09/21/2020 Creatinine 1.79, BUN 40, Potassium 4.1, Sodium 135, GFR 26 A complete set of results can be found in Results Review.   Recommendations:  No changes.    Follow-up plan: ICM clinic phone appointment on 10/05/2021.  91 day device clinic remote transmission 11/13/2021.     EP/Cardiology Office Visits:  11/19/2021 with Dr Caryl Comes.   Recall 03/25/2022 with Dr Haroldine Laws   Copy of ICM check sent to Dr. Caryl Comes.    3 month ICM trend: 08/31/2021.    12-14 Month ICM trend:     [name], RN 09/03/2021 3:53 PM

## 2021-09-08 ENCOUNTER — Encounter (HOSPITAL_COMMUNITY): Payer: Self-pay

## 2021-09-08 NOTE — Progress Notes (Unsigned)
Received fax from ortho care regarding patient having Right interlaminar lumbar epidural and needing to be off Eliquis prior to procedure.  Form [phone] and spoke to Beckie Salts to confirm receipt of fax. Ok to hold Eliquis as per Dr, Haroldine Laws

## 2021-09-24 ENCOUNTER — Ambulatory Visit: Payer: Self-pay

## 2021-09-24 ENCOUNTER — Ambulatory Visit: Payer: Medicare HMO | Admitting: Physical Medicine and Rehabilitation

## 2021-09-24 ENCOUNTER — Encounter: Payer: Self-pay | Admitting: Physical Medicine and Rehabilitation

## 2021-09-24 VITALS — BP 127/68 | HR 114

## 2021-09-24 DIAGNOSIS — M5416 Radiculopathy, lumbar region: Secondary | ICD-10-CM

## 2021-09-24 MED ORDER — METHYLPREDNISOLONE ACETATE 80 MG/ML IJ SUSP
40.0000 mg | Freq: Once | INTRAMUSCULAR | Status: AC
  Administered 2021-09-24: 40 mg

## 2021-09-24 NOTE — Progress Notes (Signed)
Pt state lower back pain tha travels down both legs. Pt state walking, standing and laying down makes the pain worse. Pt state she takes pain meds to help ease her pain.  Numeric Pain Rating Scale and Functional Assessment Average Pain 7   In the last MONTH (on 0-10 scale) has pain interfered with the following?  1. General activity like being  able to carry out your everyday physical activities such as walking, climbing stairs, carrying groceries, or moving a chair?  Rating(7)   +Driver, -BT pt stop Bt 2 day ago, -Dye Allergies.

## 2021-09-24 NOTE — Patient Instructions (Signed)

## 2021-10-01 DIAGNOSIS — M170 Bilateral primary osteoarthritis of knee: Secondary | ICD-10-CM | POA: Diagnosis not present

## 2021-10-01 DIAGNOSIS — N184 Chronic kidney disease, stage 4 (severe): Secondary | ICD-10-CM | POA: Diagnosis not present

## 2021-10-01 DIAGNOSIS — M810 Age-related osteoporosis without current pathological fracture: Secondary | ICD-10-CM | POA: Diagnosis not present

## 2021-10-01 DIAGNOSIS — I509 Heart failure, unspecified: Secondary | ICD-10-CM | POA: Diagnosis not present

## 2021-10-01 DIAGNOSIS — E7800 Pure hypercholesterolemia, unspecified: Secondary | ICD-10-CM | POA: Diagnosis not present

## 2021-10-01 DIAGNOSIS — I130 Hypertensive heart and chronic kidney disease with heart failure and stage 1 through stage 4 chronic kidney disease, or unspecified chronic kidney disease: Secondary | ICD-10-CM | POA: Diagnosis not present

## 2021-10-01 DIAGNOSIS — D631 Anemia in chronic kidney disease: Secondary | ICD-10-CM | POA: Diagnosis not present

## 2021-10-01 DIAGNOSIS — F419 Anxiety disorder, unspecified: Secondary | ICD-10-CM | POA: Diagnosis not present

## 2021-10-01 DIAGNOSIS — G629 Polyneuropathy, unspecified: Secondary | ICD-10-CM | POA: Diagnosis not present

## 2021-10-03 NOTE — Procedures (Signed)
Lumbar Epidural Steroid Injection - Interlaminar Approach with Fluoroscopic Guidance  Patient: Alexandra [name]      Date of Birth: May 25, 1927 MRN: [id number] PCP: Lawerance Cruel, MD      Visit Date: 09/24/2021   Universal Protocol:     Consent Given By: the patient  Position: PRONE  Additional Comments: Vital signs were monitored before and after the procedure. Patient was prepped and draped in the usual sterile fashion. The correct patient, procedure, and site was verified.   Injection Procedure Details:   Procedure diagnoses: Lumbar radiculopathy [M54.16]   Meds Administered:  Meds ordered this encounter  Medications   methylPREDNISolone acetate (DEPO-MEDROL) injection 40 mg     Laterality: Right  Location/Site:  L5-S1  Needle: 3.5 in., 20 ga. Tuohy  Needle Placement: Paramedian epidural  Findings:   -Comments: Excellent flow of contrast into the epidural space.  Procedure Details: Using a paramedian approach from the side mentioned above, the region overlying the inferior lamina was localized under fluoroscopic visualization and the soft tissues overlying this structure were infiltrated with 4 ml. of 1% Lidocaine without Epinephrine. The Tuohy needle was inserted into the epidural space using a paramedian approach.   The epidural space was localized using loss of resistance along with counter oblique bi-planar fluoroscopic views.  After negative aspirate for air, blood, and CSF, a 2 ml. volume of Isovue-250 was injected into the epidural space and the flow of contrast was observed. Radiographs were obtained for documentation purposes.    The injectate was administered into the level noted above.   Additional Comments:  The patient tolerated the procedure well Dressing: 2 x 2 sterile gauze and Band-Aid    Post-procedure details: Patient was observed during the procedure. Post-procedure instructions were reviewed.  Patient left the clinic in stable  condition.

## 2021-10-03 NOTE — Progress Notes (Signed)
Alexandra [name] - 88 y.o. female MRN [id number]  Date of birth: Mar 18, 1927  Office Visit Note: Visit Date: 09/24/2021 PCP: Lawerance Cruel, MD Referred by: Lawerance Cruel, MD  Subjective: Chief Complaint  Patient presents with   Lower Back - Pain   Right Leg - Pain   Left Leg - Pain   HPI:  Alexandra [name] is a 88 y.o. female who comes in today at the request of Benita Stabile, PA-C for planned Right L5-S1 Lumbar Interlaminar epidural steroid injection with fluoroscopic guidance.  The patient has failed conservative care including home exercise, medications, time and activity modification.  This injection will be diagnostic and hopefully therapeutic.  Please see requesting physician notes for further details and justification.   ROS Otherwise per HPI.  Assessment & Plan: Visit Diagnoses:    ICD-10-CM   1. Lumbar radiculopathy  M54.16 XR C-ARM NO REPORT    Epidural Steroid injection    methylPREDNISolone acetate (DEPO-MEDROL) injection 40 mg      Plan: No additional findings.   Meds & Orders:  Meds ordered this encounter  Medications   methylPREDNISolone acetate (DEPO-MEDROL) injection 40 mg    Orders Placed This Encounter  Procedures   XR C-ARM NO REPORT   Epidural Steroid injection    Follow-up: Return for visit to requesting provider as needed.   Procedures: No procedures performed  Lumbar Epidural Steroid Injection - Interlaminar Approach with Fluoroscopic Guidance  Patient: Alexandra [name]      Date of Birth: [birth year] MRN: [id number] PCP: Lawerance Cruel, MD      Visit Date: 09/24/2021   Universal Protocol:     Consent Given By: the patient  Position: PRONE  Additional Comments: Vital signs were monitored before and after the procedure. Patient was prepped and draped in the usual sterile fashion. The correct patient, procedure, and site was verified.   Injection Procedure Details:   Procedure diagnoses: Lumbar radiculopathy [M54.16]    Meds Administered:  Meds ordered this encounter  Medications   methylPREDNISolone acetate (DEPO-MEDROL) injection 40 mg     Laterality: Right  Location/Site:  L5-S1  Needle: 3.5 in., 20 ga. Tuohy  Needle Placement: Paramedian epidural  Findings:   -Comments: Excellent flow of contrast into the epidural space.  Procedure Details: Using a paramedian approach from the side mentioned above, the region overlying the inferior lamina was localized under fluoroscopic visualization and the soft tissues overlying this structure were infiltrated with 4 ml. of 1% Lidocaine without Epinephrine. The Tuohy needle was inserted into the epidural space using a paramedian approach.   The epidural space was localized using loss of resistance along with counter oblique bi-planar fluoroscopic views.  After negative aspirate for air, blood, and CSF, a 2 ml. volume of Isovue-250 was injected into the epidural space and the flow of contrast was observed. Radiographs were obtained for documentation purposes.    The injectate was administered into the level noted above.   Additional Comments:  The patient tolerated the procedure well Dressing: 2 x 2 sterile gauze and Band-Aid    Post-procedure details: Patient was observed during the procedure. Post-procedure instructions were reviewed.  Patient left the clinic in stable condition.   Clinical History: No specialty comments available.     Objective:  VS:  HT:    WT:   BMI:     BP:127/68  HR:(!) 114bpm  TEMP: ( )  RESP:  Physical Exam Vitals and nursing note reviewed.  Constitutional:  General: She is not in acute distress.    Appearance: Normal appearance. She is not ill-appearing.  HENT:     Head: Normocephalic and atraumatic.     Right Ear: External ear normal.     Left Ear: External ear normal.  Eyes:     Extraocular Movements: Extraocular movements intact.  Cardiovascular:     Rate and Rhythm: Normal rate.     Pulses:  Normal pulses.  Pulmonary:     Effort: Pulmonary effort is normal. No respiratory distress.  Abdominal:     General: There is no distension.     Palpations: Abdomen is soft.  Musculoskeletal:        General: Tenderness present.     Cervical back: Neck supple.     Right lower leg: No edema.     Left lower leg: No edema.     Comments: Patient has good distal strength with no pain over the greater trochanters.  No clonus or focal weakness.  Skin:    Findings: No erythema, lesion or rash.  Neurological:     General: No focal deficit present.     Mental Status: She is alert and oriented to person, place, and time.     Sensory: No sensory deficit.     Motor: No weakness or abnormal muscle tone.     Coordination: Coordination normal.  Psychiatric:        Mood and Affect: Mood normal.        Behavior: Behavior normal.      Imaging: No results found.

## 2021-10-05 ENCOUNTER — Ambulatory Visit (INDEPENDENT_AMBULATORY_CARE_PROVIDER_SITE_OTHER): Payer: Medicare HMO

## 2021-10-05 DIAGNOSIS — M2011 Hallux valgus (acquired), right foot: Secondary | ICD-10-CM | POA: Diagnosis not present

## 2021-10-05 DIAGNOSIS — Z95810 Presence of automatic (implantable) cardiac defibrillator: Secondary | ICD-10-CM | POA: Diagnosis not present

## 2021-10-05 DIAGNOSIS — M2041 Other hammer toe(s) (acquired), right foot: Secondary | ICD-10-CM | POA: Diagnosis not present

## 2021-10-05 DIAGNOSIS — I5022 Chronic systolic (congestive) heart failure: Secondary | ICD-10-CM | POA: Diagnosis not present

## 2021-10-05 DIAGNOSIS — M2012 Hallux valgus (acquired), left foot: Secondary | ICD-10-CM | POA: Diagnosis not present

## 2021-10-05 DIAGNOSIS — M2042 Other hammer toe(s) (acquired), left foot: Secondary | ICD-10-CM | POA: Diagnosis not present

## 2021-10-09 NOTE — Progress Notes (Signed)
EPIC Encounter for ICM Monitoring  Patient Name: SAFIYA GIRDLER is a 88 y.o. female Date: 10/09/2021 Primary Care Physican: Lawerance Cruel, MD Primary Cardiologist: Skains/Bensimhon Electrophysiologist: Vergie Living Pacing:  98%   02/04/2021 Weight: 162 lbs 04/22/2021 Weight: 168 lbs                                                              Spoke with patient and heart failure questions reviewed.  Transmission results reviewed.  Pt asymptomatic for fluid accumulation.     Corvue Thoracic impedance suggesting normal fluid levels.    Prescribed:  Furosemide 40 mg Take one tablet (40 mg) by mouth once every other day. May take extra as needed.   Potassium 10 mEq 1 tablet daily.   Labs: 01/29/2021 Creatinine 1.24, BUN 30, Potassium 4.2, Sodium 142, GFR 41 09/21/2020 Creatinine 1.79, BUN 40, Potassium 4.1, Sodium 135, GFR 26 A complete set of results can be found in Results Review.   Recommendations:  No changes.    Follow-up plan: ICM clinic phone appointment on 11/09/2021.  91 day device clinic remote transmission 11/13/2021.     EP/Cardiology Office Visits:  12/08/2021 with Dr Caryl Comes.   Recall 03/25/2022 with Dr Haroldine Laws   Copy of ICM check sent to Dr. Caryl Comes.   3 month ICM trend: 10/05/2021.    12-14 Month ICM trend:     [name], RN 10/09/2021 4:04 PM

## 2021-10-15 ENCOUNTER — Telehealth: Payer: Self-pay | Admitting: Internal Medicine

## 2021-10-15 NOTE — Telephone Encounter (Signed)
Pt c/o Shortness Of Breath: STAT if SOB developed within the last 24 hours or pt is noticeably SOB on the phone  1. Are you currently SOB (can you hear that pt is SOB on the phone)? No- when she walk or move around  2. How long have you been experiencing SOB? About 2 weeks  3. Are you SOB when sitting or when up moving around? moving around  4. Are you currently experiencing any other symptoms?  Have probles sleeping sometimes- patient wanted tto be seen asap- first available is 10+-23-23 with Jonni Sanger

## 2021-10-15 NOTE — Telephone Encounter (Signed)
Returned call to patient, she reports SOB with activity for the past 2 weeks. She states she missed a dose of her Lasix yesterday but took it today (prescribed '40mg'$  every other day, may take extra PRN). She has not had any extra doses. She states she does not believe this is related to fluid overload. Corvue Thoracic impedance showed normal fluid levels on 10/05/21.  Patient would like to know if she could be seen sooner for evaluation than 11/09/21. Will forward to Dr. Olin Pia nurse to see if she can work patient in.

## 2021-10-16 NOTE — Telephone Encounter (Signed)
Spoke with pt and advised have requested scheduler contact her regarding sooner appointment or placing her on wait/cancellation list.  Pt verbalizes understanding and thanked Therapist, sports for the call.

## 2021-10-20 NOTE — Progress Notes (Unsigned)
Cardiology Office Note Date:  10/20/2021  Patient ID:  Alexandra [name], Alexandra [name] 1927/07/17, MRN [id number] PCP:  Lawerance Cruel, MD  Electrophysiologist: Dr. Caryl Comes Nephrology: Dr. Hollie Salk    Chief Complaint: SOB  History of Present Illness: Alexandra [name] is a 88 y.o. female with history of NICM, chronic CHF (suspect takatsubo), DVT/PE, CKD (III), HTN, LBBB, hypothyroidism, CRT-P  2016 home on milrinone This year complicated by picc line infection/bacteremia, Bcx with [facility] 2/2 with for diptheroids. Bcx 1/2 at [facility]-Bacteremia-cornybacterium   Eventually CRT-d Dec 2016 Feb 2017 off milrinone Subsequent Recovery of LVEF  She saw Dr. Haroldine Laws March 2023, LVEF 55-60%, lasix QOD though less of late with volume depletion.  Mentions prior failure of BB.  No changes were made.  She saw Andy May 2023, she was nearing ERI, discussed procedure with plans to replace if triggered.  Underwent gen change July 2023  TODAY She is accompanied by her son. She reports onset of DOE about 3 weeks ago. No CP, no palpitations, no cardiac awareness She reports no SOB prior to 3 weeks ago, her son seems to disagree to some extent at least. He reports that she is very sedentary, spends most of her day watching TV/or at least seated and thinks this has caught up to her. She denies rest SOB, no symptoms of orthopnea or PND No near syncope or syncope.  No bleeding or signs of bleeding She does not look pale  Device information Abbott CRT-P implanted 01/16/2015, gen change 08/14/2021  Amiodarone started 2016 (for an SVT while on milrinone) stopped quickly with increased SOB, ? Of amio tox Past Medical History:  Diagnosis Date   Adult hypothyroidism 04/02/2014   Anemia of chronic disease 08/13/2014   Anxiety    Arthritis    Asthma    breast ca dx'd 2000   left   Bursitis    right hip   CAP (community acquired pneumonia) 12/19/2012   Chest pain    NM stress test 05/2011 Normal   CHF  (congestive heart failure) ([place]) 08/19/2014   CKD (chronic kidney disease) stage 3, GFR 30-59 ml/min ([facility]) 08/13/2014   Depression    DOE (dyspnea on exertion)    No SOB at rest   Essential (primary) hypertension 04/02/2014   Fatigue    excertional   VOJJKKXF(818.2)    Hereditary and idiopathic peripheral neuropathy 07/02/2014   Hypercholesteremia    Intermittent LBBB (left bundle branch block) 12/21/2012   Leg weakness    Neuropathy    Obesity    Osteopenia    Peripheral neuropathy    Presence of permanent cardiac pacemaker 01/16/2015   BIV   Sacroiliitis, not elsewhere classified ([place])    Situational depression    Trigger finger    [name]    Past Surgical History:  Procedure Laterality Date   ACHILLES TENDON SURGERY     [place] N/A 08/14/2021   Procedure: BIV PACEMAKER GENERATOR CHANGEOUT;  Surgeon: Deboraha Sprang, MD;  Location: [facility];  Service: Cardiovascular;  Laterality: N/A;   BREAST LUMPECTOMY     breast cancer   CARDIAC CATHETERIZATION N/A 07/21/2015   Procedure: Right Heart Cath;  Surgeon: Jolaine Artist, MD;  Location: [facility];  Service: Cardiovascular;  Laterality: N/A;   CHOLECYSTECTOMY N/A 08/13/2014   Procedure: LAPAROSCOPIC CHOLECYSTECTOMY;  Surgeon: Stark Klein, MD;  Location: [facility];  Service: General;  Laterality: N/A;   EP IMPLANTABLE DEVICE  N/A 01/16/2015   Procedure: BiV Pacemaker Insertion CRT-P;  Surgeon: Deboraha Sprang, MD;  Location: [facility];  Service: Cardiovascular;  Laterality: N/A;   LEG SURGERY     LUMBAR LAMINECTOMY     POLYPECTOMY     TONSILLECTOMY     VESICOVAGINAL FISTULA CLOSURE W/ TAH     DC hysterectomy    Current Outpatient Medications  Medication Sig Dispense Refill   albuterol (PROVENTIL HFA;VENTOLIN HFA) 108 (90 BASE) MCG/ACT inhaler Inhale 2 puffs into the lungs every 6 (six) hours as needed for wheezing or shortness of breath. Reported on 05/27/2015      ALPRAZolam (XANAX) 0.25 MG tablet Take 0.25 mg by mouth 3 (three) times daily as needed for anxiety. Reported on 05/27/2015     amoxicillin (AMOXIL) 500 MG capsule Take 4 capsules by mouth as needed 1 hour before dental work 4 capsule prn   b complex vitamins tablet Take 1 tablet by mouth daily with lunch.      calcium carbonate (TUMS - DOSED IN MG ELEMENTAL CALCIUM) 500 MG chewable tablet Chew 2 tablets by mouth daily as needed for indigestion or heartburn. Reported on 07/18/2015     cholecalciferol (VITAMIN D) 1000 UNITS tablet Take 1,000 Units by mouth daily with lunch.      citalopram (CELEXA) 10 MG tablet Take 10 mg by mouth daily.     ELIQUIS 2.5 MG TABS tablet TAKE 1 TABLET(2.5 MG) BY MOUTH TWICE DAILY 60 tablet 11   famotidine (PEPCID) 20 MG tablet Take 20 mg by mouth daily.     ferrous sulfate 325 (65 FE) MG tablet Take 325 mg by mouth daily with breakfast.     furosemide (LASIX) 40 MG tablet Take one tablet (40 mg) by mouth once every other day. May take extra as needed. 30 tablet 3   hydrALAZINE (APRESOLINE) 25 MG tablet Take 1 tablet (25 mg total) by mouth 2 (two) times daily. 270 tablet 0   lactose free nutrition (BOOST) LIQD Take 237 mLs by mouth daily with breakfast.      levothyroxine (SYNTHROID, LEVOTHROID) 25 MCG tablet Take 25 mcg by mouth daily before breakfast. for thyroid     MAGNESIUM-OXIDE 400 (241.3 Mg) MG tablet TAKE 1/2 TABLET(200 MG) BY MOUTH DAILY 15 tablet 11   Multiple Vitamin (MULITIVITAMIN WITH MINERALS) TABS Take 1 tablet by mouth daily with lunch.      nitroGLYCERIN (NITROSTAT) 0.4 MG SL tablet Place 1 tablet (0.4 mg total) under the tongue every 5 (five) minutes as needed for chest pain. 25 tablet 4   oxyCODONE-acetaminophen (PERCOCET/ROXICET) 5-325 MG tablet Take 1 tablet by mouth every 8 (eight) hours as needed for severe pain. 30 tablet 0   Polyethyl Glycol-Propyl Glycol (SYSTANE OP) Place 1 drop into both eyes 2 (two) times daily.     potassium chloride (K-DUR)  10 MEQ tablet TAKE 1 TABLET BY MOUTH DAILY 90 tablet 2   spironolactone (ALDACTONE) 25 MG tablet Take 25 mg by mouth daily.     No current facility-administered medications for this visit.    Allergies:   Amiodarone, Cymbalta [duloxetine hcl], and Lyrica [pregabalin]   Social History:  The patient  reports that she has never smoked. She has never used smokeless tobacco. She reports that she does not drink alcohol and does not use drugs.   Family History:  The patient's family history includes Alzheimer's disease in her sister; CVA in her daughter; Diabetes in her brother; Hypertension in her daughter;  Pancreatic cancer (age of onset: 89) in her father; Pneumonia (age of onset: 63) in her mother; Stroke in her daughter.  ROS:  Please see the history of present illness.    All other systems are reviewed and otherwise negative.   PHYSICAL EXAM:  VS:  There were no vitals taken for this visit. BMI: There is no height or weight on file to calculate BMI. Well nourished, well developed, in no acute distress HEENT: normocephalic, atraumatic Neck: no JVD, carotid bruits or masses Cardiac:   RRR; no significant murmurs, no rubs, or gallops Lungs:   CTA b/l, no wheezing, rhonchi or rales Abd: soft, nontender MS: no deformity or  atrophy Ext: no edema Skin: warm and dry, no rash Neuro:  No gross deficits appreciated Psych: euthymic mood, full affect  PPM site is stable, no tethering or discomfort   EKG:  Done today and reviewed by myself shows  SR/V paced 94bpm, unchanged, QRS 166m   Device interrogation done today and reviewed by myself:  Battery and lead measurements are good AP 1.5% BP 98% No arrhythmias   03/24/2021: TTE  1. Left ventricular ejection fraction, by estimation, is 55 to 60%. The  left ventricle has normal function. The left ventricle has no regional  wall motion abnormalities. Left ventricular diastolic parameters are  consistent with Grade I diastolic   dysfunction (impaired relaxation).   2. Right ventricular systolic function is normal. The right ventricular  size is normal.   3. Left atrial size was mildly dilated.   4. Pacer present.   5. The mitral valve is degenerative. No evidence of mitral valve  regurgitation. No evidence of mitral stenosis.   6. The aortic valve is normal in structure. There is mild calcification  of the aortic valve. Aortic valve regurgitation is not visualized. No  aortic stenosis is present.   7. The inferior vena cava is normal in size with greater than 50%  respiratory variability, suggesting right atrial pressure of 3 mmHg.   Recent Labs: 01/29/2021: B Natriuretic Peptide 49.5 08/07/2021: BUN 36; Creatinine, Ser 1.55; Hemoglobin 12.9; Platelets 181; Potassium 4.7; Sodium 140  No results found for requested labs within last 365 days.   CrCl cannot be calculated (Patient's most recent lab result is older than the maximum 21 days allowed.).   Wt Readings from Last 3 Encounters:  08/14/21 165 lb (74.8 kg)  05/28/21 166 lb 6.4 oz (75.5 kg)  03/24/21 168 lb (76.2 kg)     Other studies reviewed: Additional studies/records reviewed today include: summarized above  ASSESSMENT AND PLAN:  PPM Intact function No programming changes made  NICM Chronic CHF Continued recovery of her LVEF march 2023 echo New DOE Exam does not suggest volume OL CorVue with only a couple months, but appears at her threshold She has not felt like she needed any extra lasix, still using QOD, no edema, home weight has been stable, [facility] with pulse Ox She did not desaturate, maintaining O2 sat RA 95-97% She also had HR increase to 110s  Discussed potential anginal equivalent, she would not likely want to pursue cath Will start with CXR, labs, and echo  She sees HF team next month, will keep that in place   AFTER the pt left, I decided to try a low dose nitrate.  I have will  message my MA to call her and get Imdur '30mg'$  started for her.  Disposition: F/u with EP in a year  for device check, sooner pending her test results.  Current medicines are reviewed at length with the patient today.  The patient did not have any concerns regarding medicines.  Venetia Night, PA-C 10/20/2021 7:37 PM     [address] [place] [id number] [phone] (office)  [phone] (fax)

## 2021-10-20 NOTE — Telephone Encounter (Signed)
Spoke with pt and appointment scheduled with Tommye Standard, PA-C 10/21/2021 at 8am.  Pt verbalizes understanding and thanked Therapist, sports for the call.

## 2021-10-20 NOTE — Telephone Encounter (Signed)
Patient calling back because she still experiencing the SOB, and looking to get a appt sooner they what she has already. Please advise

## 2021-10-21 ENCOUNTER — Encounter: Payer: Self-pay | Admitting: Physician Assistant

## 2021-10-21 ENCOUNTER — Ambulatory Visit: Payer: Medicare HMO | Attending: Physician Assistant | Admitting: Physician Assistant

## 2021-10-21 ENCOUNTER — Ambulatory Visit: Payer: Medicare HMO | Admitting: Internal Medicine

## 2021-10-21 VITALS — BP 130/52 | HR 94 | Ht 64.0 in | Wt 166.0 lb

## 2021-10-21 DIAGNOSIS — I5022 Chronic systolic (congestive) heart failure: Secondary | ICD-10-CM

## 2021-10-21 DIAGNOSIS — Z950 Presence of cardiac pacemaker: Secondary | ICD-10-CM

## 2021-10-21 DIAGNOSIS — R0600 Dyspnea, unspecified: Secondary | ICD-10-CM | POA: Diagnosis not present

## 2021-10-21 DIAGNOSIS — R0609 Other forms of dyspnea: Secondary | ICD-10-CM | POA: Diagnosis not present

## 2021-10-21 DIAGNOSIS — I428 Other cardiomyopathies: Secondary | ICD-10-CM

## 2021-10-21 LAB — CUP PACEART INCLINIC DEVICE CHECK
Battery Remaining Longevity: 94 mo
Battery Voltage: 3.02 V
Brady Statistic RA Percent Paced: 1.5 %
Brady Statistic RV Percent Paced: 98 %
Date Time Interrogation Session: 20231004174453
Implantable Lead Implant Date: 20161229
Implantable Lead Implant Date: 20161229
Implantable Lead Implant Date: 20161229
Implantable Lead Location: 753858
Implantable Lead Location: 753859
Implantable Lead Location: 753860
Implantable Pulse Generator Implant Date: 20230728
Lead Channel Impedance Value: 1025 Ohm
Lead Channel Impedance Value: 400 Ohm
Lead Channel Impedance Value: 425 Ohm
Lead Channel Pacing Threshold Amplitude: 0.625 V
Lead Channel Pacing Threshold Amplitude: 0.75 V
Lead Channel Pacing Threshold Amplitude: 0.875 V
Lead Channel Pacing Threshold Pulse Width: 0.5 ms
Lead Channel Pacing Threshold Pulse Width: 0.5 ms
Lead Channel Pacing Threshold Pulse Width: 0.5 ms
Lead Channel Sensing Intrinsic Amplitude: 12 mV
Lead Channel Sensing Intrinsic Amplitude: 2.1 mV
Lead Channel Setting Pacing Amplitude: 1.625
Lead Channel Setting Pacing Amplitude: 2 V
Lead Channel Setting Pacing Amplitude: 2 V
Lead Channel Setting Pacing Pulse Width: 0.5 ms
Lead Channel Setting Pacing Pulse Width: 0.5 ms
Lead Channel Setting Sensing Sensitivity: 2 mV
Pulse Gen Model: 3562
Pulse Gen Serial Number: 8101642

## 2021-10-21 NOTE — Patient Instructions (Signed)
Medication Instructions:   Your physician recommends that you continue on your current medications as directed. Please refer to the Current Medication list given to you today.    *If you need a refill on your cardiac medications before your next appointment, please call your pharmacy*   Lab Work:  BMET AND CBC TODAY    If you have labs (blood work) drawn today and your tests are completely normal, you will receive your results only by: [place] (if you have MyChart) OR A paper copy in the mail If you have any lab test that is abnormal or we need to change your treatment, we will call you to review the results.   Testing/Procedures: Your physician has requested that you have an echocardiogram. Echocardiography is a painless test that uses sound waves to create images of your heart. It provides your doctor with information about the size and shape of your heart and how well your heart's chambers and valves are working. This procedure takes approximately one hour. There are no restrictions for this procedure.   A chest x-ray takes a picture of the organs and structures inside the chest, including the heart, lungs, and blood vessels. This test can show several things, including, whether the heart is enlarges; whether fluid is building up in the lungs; and whether pacemaker / defibrillator leads are still in place. Located in: [facility] Address: [address], [place], [place] [address] Areas served: [place] and nearby areas Hours:  Tuesday 8AM-5:30PM Wednesday 8AM-5:30PM Thursday 8AM-5:30PM Friday 8AM-5:30PM Saturday Closed Sunday Closed Monday 8AM-5:30PM Phone: [phone]      Follow-Up: At [facility], you and your health needs are our priority.  As part of our continuing mission to provide you with exceptional heart care, we have created designated Provider Care Teams.  These Care Teams include your primary Cardiologist (physician)  and Advanced Practice Providers (APPs -  Physician Assistants and Nurse Practitioners) who all work together to provide you with the care you need, when you need it.  We recommend signing up for the patient portal called "MyChart".  Sign up information is provided on this After Visit Summary.  MyChart is used to connect with patients for Virtual Visits (Telemedicine).  Patients are able to view lab/test results, encounter notes, upcoming appointments, etc.  Non-urgent messages can be sent to your provider as well.   To learn more about what you can do with MyChart, go to [email or web address].    Your next appointment:  HEART FAILURE  IN ONE MONTH  1 year(s)  The format for your next appointment:   In Person  Provider:   You may see Virl Axe, MD or one of the following Advanced Practice Providers on your designated Care Team:   Tommye Standard, [place] Legrand Como "Jonni Sanger" Chalmers Cater, [place]    Other Instructions   Important Information About Sugar

## 2021-10-22 ENCOUNTER — Telehealth: Payer: Self-pay | Admitting: *Deleted

## 2021-10-22 ENCOUNTER — Other Ambulatory Visit: Payer: Self-pay | Admitting: *Deleted

## 2021-10-22 LAB — CBC
Hematocrit: 38.7 % (ref 34.0–46.6)
Hemoglobin: 13.4 g/dL (ref 11.1–15.9)
MCH: 32.8 pg (ref 26.6–33.0)
MCHC: 34.6 g/dL (ref 31.5–35.7)
MCV: 95 fL (ref 79–97)
Platelets: 195 10*3/uL (ref 150–450)
RBC: 4.09 x10E6/uL (ref 3.77–5.28)
RDW: 11.8 % (ref 11.7–15.4)
WBC: 9.5 10*3/uL (ref 3.4–10.8)

## 2021-10-22 LAB — BASIC METABOLIC PANEL
BUN/Creatinine Ratio: 32 — ABNORMAL HIGH (ref 12–28)
BUN: 47 mg/dL — ABNORMAL HIGH (ref 10–36)
CO2: 27 mmol/L (ref 20–29)
Calcium: 10.8 mg/dL — ABNORMAL HIGH (ref 8.7–10.3)
Chloride: 98 mmol/L (ref 96–106)
Creatinine, Ser: 1.46 mg/dL — ABNORMAL HIGH (ref 0.57–1.00)
Glucose: 105 mg/dL — ABNORMAL HIGH (ref 70–99)
Potassium: 4.7 mmol/L (ref 3.5–5.2)
Sodium: 140 mmol/L (ref 134–144)
eGFR: 34 mL/min/{1.73_m2} — ABNORMAL LOW (ref >59)

## 2021-10-22 MED ORDER — NITROGLYCERIN 0.4 MG SL SUBL: 0.4000 mg | SUBLINGUAL_TABLET | SUBLINGUAL | 3 refills | Status: DC | PRN

## 2021-10-22 MED ORDER — ISOSORBIDE MONONITRATE ER 30 MG PO TB24: 30.0000 mg | ORAL_TABLET | Freq: Every day | ORAL | 1 refills | Status: DC

## 2021-10-22 NOTE — Telephone Encounter (Signed)
Spoke with patient and patient aware of new medication start

## 2021-10-22 NOTE — Telephone Encounter (Signed)
-----   Message from [facility], [place] sent at 10/21/2021  7:00 PM EDT ----- Regarding: New med 0lease start her on Imdur '30mg'$  daily to see if it helps her SOB. I included it in my note, so it is written there for you to refer to.  Thanks [employer]

## 2021-10-30 ENCOUNTER — Ambulatory Visit
Admission: RE | Admit: 2021-10-30 | Discharge: 2021-10-30 | Disposition: A | Discharge status: Home / Self Care | Payer: Medicare HMO | Source: Ambulatory Visit | Attending: Physician Assistant | Admitting: Physician Assistant

## 2021-10-30 DIAGNOSIS — R0602 Shortness of breath: Secondary | ICD-10-CM | POA: Diagnosis not present

## 2021-10-30 DIAGNOSIS — R0600 Dyspnea, unspecified: Secondary | ICD-10-CM

## 2021-11-04 ENCOUNTER — Ambulatory Visit (HOSPITAL_COMMUNITY): Payer: Medicare HMO | Attending: Physician Assistant

## 2021-11-04 DIAGNOSIS — R0600 Dyspnea, unspecified: Secondary | ICD-10-CM | POA: Diagnosis not present

## 2021-11-06 ENCOUNTER — Telehealth: Payer: Self-pay | Admitting: Internal Medicine

## 2021-11-06 ENCOUNTER — Telehealth: Payer: Self-pay

## 2021-11-06 LAB — ECHOCARDIOGRAM COMPLETE
Area-P 1/2: 5.02 cm2
S' Lateral: 2.6 cm

## 2021-11-06 NOTE — Telephone Encounter (Signed)
Patient called to follow-up on the results of her chest x-ray, Echo test and blood work.  Patient stated she will be going out of town next week and would to get these results before she leaves.

## 2021-11-06 NOTE — Telephone Encounter (Signed)
Received call from patient.  She stated she will be traveling by plane from 10/26-11/4 to [place] and needed recommendation about taking monitor.  Advised if she is flying she does not have to take but can do so if she wishes.  She would rather not take the monitor.  Advised if she has any problems while she is gone to use her local ER since we will not receive any alert transmissions during her tim away from the monitor.  Advised will inform device clinic she will not be available for 10/27 3 month remote transmission.

## 2021-11-06 NOTE — Telephone Encounter (Signed)
Spoke with pt and advised of lab and CXR result.  Pt advised echo has not yet been resulted.  Pt reports SOB has improved with addition of Imdur but does not want to continue taking medication if it is not necessary.  Provided education re: Imdur and asked if SOB has improved why she wouldn't want to continue medication.  Pt states she would like to discontinue medication if it is not necessary because she is on so many medications and this particular medication has so many side effects. Pt has tolerated medication without any problems.  Pt advised will forward to Tommye Standard, PA-C ordering provider for review of echo.  Pt verbalizes understanding and agrees with current plan.

## 2021-11-09 ENCOUNTER — Ambulatory Visit (INDEPENDENT_AMBULATORY_CARE_PROVIDER_SITE_OTHER): Payer: Medicare HMO

## 2021-11-09 ENCOUNTER — Encounter: Payer: Medicare HMO | Admitting: Student

## 2021-11-09 DIAGNOSIS — I5022 Chronic systolic (congestive) heart failure: Secondary | ICD-10-CM

## 2021-11-09 DIAGNOSIS — Z950 Presence of cardiac pacemaker: Secondary | ICD-10-CM | POA: Diagnosis not present

## 2021-11-10 NOTE — Progress Notes (Signed)
EPIC Encounter for ICM Monitoring  Patient Name: Alexandra [name] is a 88 y.o. female Date: 11/10/2021 Primary Care Physican: Lawerance Cruel, MD Primary Cardiologist: Skains/Bensimhon Electrophysiologist: Vergie Living Pacing:  98%   02/04/2021 Weight: 162 lbs 04/22/2021 Weight: 168 lbs                                                                 Spoke with patient and heart failure questions reviewed.  Transmission results reviewed.  Pt asymptomatic for fluid accumulation.  Reports feeling well at this time and voices no complaints.   Will be flying to [place] from 10/26-11/4.   Corvue Thoracic impedance normal but was suggesting possible fluid accumulation 10/6-10/17.   Prescribed:  Furosemide 40 mg Take one tablet (40 mg) by mouth once every other day. May take extra as needed.   Potassium 10 mEq 1 tablet daily.   Labs: 10/21/2021 Creatinine 1.46, BUN 47, Potassium 4.7, Sodium 140, GFR 32-34 A complete set of results can be found in Results Review.   Recommendations:  No changes and encouraged to call if experiencing any fluid symptoms.   Follow-up plan: ICM clinic phone appointment on 12/14/2021.  91 day device clinic remote transmission 02/12/2022.     EP/Cardiology Office Visits:  11/30/2021 with Tommye Standard, PA.   Recall 03/25/2022 with Dr Haroldine Laws   Copy of ICM check sent to Dr. Caryl Comes.    3 month ICM trend: 11/09/2021.    12-14 Month ICM trend:     [name], RN 11/10/2021 6:50 AM

## 2021-11-10 NOTE — Telephone Encounter (Signed)
I canceled remote.

## 2021-11-19 ENCOUNTER — Encounter: Payer: Medicare HMO | Admitting: Internal Medicine

## 2021-11-29 NOTE — Progress Notes (Incomplete)
Patient ID: Alexandra [name], female   DOB: [birth year], 88 y.o.   MRN: [id number]     Advanced Heart Failure Clinic Note   Primary Care: Dr. Melinda Crutch Primary Cardiologist: Dr Candee Furbish Primary HF: Dr Haroldine Laws  HPI: Alexandra [name] is a 88 y.o. 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 Discharge weight was 196.  She was admitted in July 2016 for Abdominal pain and SOB. Her Gallbladder was thought to be causing her pain, so a lap choley was performed that admission. Echo on 08/15/14 showed EF 20%. There were thoughts that she may have stress induced CMP due to the death of her husband in May 15, 2022 after several months of hospice care.    She was directly admitted to [facility] on 10/11/14 with concerns for low output with SBPs in 80s and tachycardia.  PICC line was placed with initial co-ox of 61%.CVP was 15. Milrinone started when co-ox dropped to 53%. She had SVT on milrinone 0.25 so started on amio. Developed acute SOB that improved quickly with cessation of amio and extra dose of IV lasix. Felt to possibly have acute amio toxicity. Milrinone decreased to 0.125 and no further PSVT. Sent home on milrinone 0.125. Diuresed well on 80 mg lasix IB BID. Discharge weight was 170.  Admitted 12/1 through 12/24/14 with PICC line infection. Bcx with [facility] 2/2 with for diptheroids. Bcx 1/2 at [facility]-Bacteremia-cornybacterium . PICC line replaced. She continued on milrinone 0.125 mcg.   S/p CRT-P placement 01/16/15 with Dr Caryl Comes.   Echo 2/17 EF ~40% Milrinone stopped 02/2015. Has had no major issues since.    Echo 7/20 EF 55%  Echo 03/23 EF 55-60%  Underwent BiV pacemaker gen change 07/23.  Saw EP for f/u 10/22. Reported fairly recent onset dyspnea with exertion. Started on Imdur  30 mg daily d/t concern for possible anginal equivalent. CXR okay. Echo 10/23 EF 40-45%, WMA lateral wall (present on prior echo), RV okay  She is here today for follow-up. Reports more dyspnea with exertion the last couple of months. Some days are better than others. She ambulated quite a bit while in [place] for a wedding recently and had no shortness of breath. However, felt winded walking into clinic today. Actually feels that imdur may be helping somewhat. No orthopnea, PND or lower extremity edema. Weight has been stable. Other than while vacationing in [place] she tends to follow fluid and sodium restriction. Denies CP. She is sedentary most of the day. Does get out of the house to play cards once a week and go to church.  ICD interrogated personally: Thoracic impedance recently below threshold and trended up to threshold last couple of days, 98% BiV paced, no arrhythmias     Review of systems complete and found to be negative unless listed in HPI.   Past Medical History:  Diagnosis Date   Adult hypothyroidism 04/02/2014   Anemia of chronic disease 08/13/2014   Anxiety    Arthritis    Asthma    breast ca dx'd 2000   left   Bursitis    right hip   CAP (community acquired pneumonia) 12/19/2012   Chest pain    NM stress test 05/2011 Normal   CHF (congestive heart failure) ([place]) 08/19/2014  CKD (chronic kidney disease) stage 3, GFR 30-59 ml/min (HCC) 08/13/2014   Depression    DOE (dyspnea on exertion)    No SOB at rest   Essential (primary) hypertension 04/02/2014   Fatigue    excertional   IRWERXVQ(008.6)    Hereditary and idiopathic peripheral neuropathy 07/02/2014   Hypercholesteremia    Intermittent LBBB (left bundle branch block) 12/21/2012   Leg weakness    Neuropathy    Obesity    Osteopenia    Peripheral neuropathy    Presence of permanent cardiac pacemaker 01/16/2015   BIV   Sacroiliitis, not elsewhere classified ([place])    Situational depression    Trigger  finger    [name]    Current Outpatient Medications  Medication Sig Dispense Refill   albuterol (PROVENTIL HFA;VENTOLIN HFA) 108 (90 BASE) MCG/ACT inhaler Inhale 2 puffs into the lungs every 6 (six) hours as needed for wheezing or shortness of breath. Reported on 05/27/2015     ALPRAZolam (XANAX) 0.25 MG tablet Take 0.25 mg by mouth 3 (three) times daily as needed for anxiety. Reported on 05/27/2015     b complex vitamins tablet Take 1 tablet by mouth daily with lunch.      calcium carbonate (TUMS - DOSED IN MG ELEMENTAL CALCIUM) 500 MG chewable tablet Chew 2 tablets by mouth daily as needed for indigestion or heartburn. Reported on 07/18/2015     cholecalciferol (VITAMIN D) 1000 UNITS tablet Take 1,000 Units by mouth daily with lunch.      citalopram (CELEXA) 10 MG tablet Take 10 mg by mouth daily.     ELIQUIS 2.5 MG TABS tablet TAKE 1 TABLET(2.5 MG) BY MOUTH TWICE DAILY 60 tablet 11   famotidine (PEPCID) 20 MG tablet Take 20 mg by mouth daily.     ferrous sulfate 325 (65 FE) MG tablet Take 325 mg by mouth daily with breakfast.     furosemide (LASIX) 40 MG tablet Take one tablet (40 mg) by mouth once every other day. May take extra as needed. 30 tablet 3   hydrALAZINE (APRESOLINE) 25 MG tablet Take 1 tablet (25 mg total) by mouth 2 (two) times daily. 270 tablet 0   isosorbide mononitrate (IMDUR) 30 MG 24 hr tablet Take 1 tablet (30 mg total) by mouth daily. 90 tablet 1   lactose free nutrition (BOOST) LIQD Take 237 mLs by mouth daily with breakfast.      levothyroxine (SYNTHROID, LEVOTHROID) 25 MCG tablet Take 25 mcg by mouth daily before breakfast. for thyroid     MAGNESIUM-OXIDE 400 (241.3 Mg) MG tablet TAKE 1/2 TABLET(200 MG) BY MOUTH DAILY 15 tablet 11   Multiple Vitamin (MULITIVITAMIN WITH MINERALS) TABS Take 1 tablet by mouth daily with lunch.      nitroGLYCERIN (NITROSTAT) 0.4 MG SL tablet Place 1 tablet (0.4 mg total) under the tongue every 5 (five) minutes as needed for chest pain. 25  tablet 3   oxyCODONE-acetaminophen (PERCOCET/ROXICET) 5-325 MG tablet Take 1 tablet by mouth every 8 (eight) hours as needed for severe pain. 30 tablet 0   Polyethyl Glycol-Propyl Glycol (SYSTANE OP) Place 1 drop into both eyes 2 (two) times daily.     potassium chloride (K-DUR) 10 MEQ tablet TAKE 1 TABLET BY MOUTH DAILY 90 tablet 2   spironolactone (ALDACTONE) 25 MG tablet Take 25 mg by mouth daily.     amoxicillin (AMOXIL) 500 MG capsule Take 4 capsules by mouth as needed 1 hour before dental work (Patient not taking: Reported  on 11/30/2021) 4 capsule prn   No current facility-administered medications for this encounter.    Allergies  Allergen Reactions   Amiodarone Shortness Of Breath and Nausea And Vomiting   Cymbalta [Duloxetine Hcl] Other (See Comments)    Depression   Lyrica [Pregabalin] Other (See Comments)    Blurry vision      Social History   Socioeconomic History   Marital status: Widowed    Spouse name: Not on file   Number of children: 5   Years of education: HS   Highest education level: Not on file  Occupational History   Occupation: Retired  Tobacco Use   Smoking status: Never   Smokeless tobacco: Never  Vaping Use   Vaping Use: Never used  Substance and Sexual Activity   Alcohol use: No    Alcohol/week: 0.0 standard drinks of alcohol   Drug use: No   Sexual activity: Never  Other Topics Concern   Not on file  Social History Narrative   Lives at home alone.   Right-handed.   Two cups caffeine daily (coffee).   Social Determinants of Health   Financial Resource Strain: Not on file  Food Insecurity: Not on file  Transportation Needs: Not on file  Physical Activity: Not on file  Stress: Not on file  Social Connections: Not on file  Intimate Partner Violence: Not on file      Family History  Problem Relation Age of Onset   Pneumonia Mother 36       cause of death   Diabetes Brother    Pancreatic cancer Father 46   Alzheimer's disease  Sister    CVA Daughter        01/21/09   Stroke Daughter    Hypertension Daughter    Heart attack Neg Hx     Vitals:   11/30/21 1140  BP: (!) 152/64  Pulse: 86  SpO2: 97%  Weight: 76.2 kg (168 lb)    Wt Readings from Last 3 Encounters:  11/30/21 76.2 kg (168 lb)  10/21/21 75.3 kg (166 lb)  08/14/21 74.8 kg (165 lb)    PHYSICAL EXAM: General:  Well appearing elderly female. Ambulated into clinic. HEENT: normal Neck: supple. JVD not elevated. Carotids 2+ bilat; no bruits.  Cor: PMI nondisplaced. Regular rate & rhythm. No rubs, gallops or murmurs. Lungs: clear Abdomen: soft, nontender, nondistended.  Extremities: no cyanosis, clubbing, rash, edema Neuro: alert & orientedx3, cranial nerves grossly intact. moves all 4 extremities w/o difficulty. Affect pleasant     ASSESSMENT & PLAN:  1. Chronic Systolic Heart failure  - Echo 10/02/14 EF 15%  - Echo 2/17 read as 40-45% reviewed probably closer to 35%. Off milrinone since 2017. s/p [employer] CRT-D placement. 01/16/15 - Echo 11/2016 LVEF 45-50%, Grade 1 DD, Moderate AI - Echo 7/20 EF 55% (completely recovered with CRT) - Echo 03/24/21. EF 55-60%  - Echo 10/23: EF estimated 40-45% but difficult acoustic windows - Stable NYHA II - Volume looks good today. Thoracic impedance at threshold on device check. Takes 40 mg furosemide every other day. Suspect some of dyspnea d/t deconditioning. Encouraged her to increase activity. There was question if symptoms may be consistent with anginal equivalent. She would not want to pursue any invasive workup. I think this is reasonable.  - Continue hydralazine 25 mg BID - Continue Imdur 30 mg daily - Continue spiro 25 mg daily - Previously failed b-blocker. - No ACE due to CKD.  - Recent labs stable  2.  H/o PE/ DVT bilateral by [place] 08/20/2014 - Continue apixaban 2.5 bid for long-term prevention of DVT per Amplify EXT trial. No bleeding  3. CKD Stage 3b - Scr has been stable between 1.5-1.7 -  Follows with Dr. Hollie Salk   4. Hx of LBBB - s/p CRT-P, gen change 07/23.  - Sees Dr. Caryl Comes, 98% BiV pacing on check today  5. Hypertension  - Blood pressure initially slightly elevated, improved on recheck. - Overall, reasonably controlled for her age  Follow-up: 3 months with Dr. Haroldine Laws (patient requesting provider)    Kjell Brannen, [place] Parents, PA-C  11:47 AM

## 2021-11-30 ENCOUNTER — Encounter (HOSPITAL_COMMUNITY): Payer: Self-pay

## 2021-11-30 ENCOUNTER — Ambulatory Visit (HOSPITAL_COMMUNITY)
Admission: RE | Admit: 2021-11-30 | Discharge: 2021-11-30 | Disposition: A | Discharge status: Home / Self Care | Payer: Medicare HMO | Source: Ambulatory Visit | Attending: Physician Assistant | Admitting: Physician Assistant

## 2021-11-30 VITALS — BP 142/68 | HR 86 | Wt 168.0 lb

## 2021-11-30 DIAGNOSIS — I5022 Chronic systolic (congestive) heart failure: Secondary | ICD-10-CM | POA: Insufficient documentation

## 2021-11-30 DIAGNOSIS — Z79899 Other long term (current) drug therapy: Secondary | ICD-10-CM | POA: Insufficient documentation

## 2021-11-30 DIAGNOSIS — I130 Hypertensive heart and chronic kidney disease with heart failure and stage 1 through stage 4 chronic kidney disease, or unspecified chronic kidney disease: Secondary | ICD-10-CM | POA: Diagnosis not present

## 2021-11-30 DIAGNOSIS — Z86718 Personal history of other venous thrombosis and embolism: Secondary | ICD-10-CM | POA: Insufficient documentation

## 2021-11-30 DIAGNOSIS — I10 Essential (primary) hypertension: Secondary | ICD-10-CM | POA: Diagnosis not present

## 2021-11-30 DIAGNOSIS — N1832 Chronic kidney disease, stage 3b: Secondary | ICD-10-CM | POA: Insufficient documentation

## 2021-11-30 DIAGNOSIS — I447 Left bundle-branch block, unspecified: Secondary | ICD-10-CM | POA: Diagnosis not present

## 2021-11-30 DIAGNOSIS — Z86711 Personal history of pulmonary embolism: Secondary | ICD-10-CM | POA: Insufficient documentation

## 2021-11-30 DIAGNOSIS — Z7901 Long term (current) use of anticoagulants: Secondary | ICD-10-CM | POA: Insufficient documentation

## 2021-11-30 DIAGNOSIS — I471 Supraventricular tachycardia, unspecified: Secondary | ICD-10-CM | POA: Diagnosis not present

## 2021-11-30 NOTE — Patient Instructions (Addendum)
Thank you for coming in today  No labs today  Your physician recommends that you schedule a follow-up appointment in:  6 months with Dr. Haroldine Laws    Do the following things EVERYDAY: Weigh yourself in the morning before breakfast. Write it down and keep it in a log. Take your medicines as prescribed Eat low salt foods--Limit salt (sodium) to 2000 mg per day.  Stay as active as you can everyday Limit all fluids for the day to less than 2 liters  At the [facility], you and your health needs are our priority. As part of our continuing mission to provide you with exceptional heart care, we have created designated Provider Care Teams. These Care Teams include your primary Cardiologist (physician) and Advanced Practice Providers (APPs- Physician Assistants and Nurse Practitioners) who all work together to provide you with the care you need, when you need it.   You may see any of the following providers on your designated Care Team at your next follow up: Dr Glori Bickers Dr Loralie Champagne Dr. [name] Hires, NP Lyda Jester, [place] [facility] [place], [place] Forestine Na, NP Audry Riles, PharmD   Please be sure to bring in all your medications bottles to every appointment.

## 2021-12-08 ENCOUNTER — Encounter: Payer: Medicare HMO | Admitting: Internal Medicine

## 2021-12-14 ENCOUNTER — Ambulatory Visit (INDEPENDENT_AMBULATORY_CARE_PROVIDER_SITE_OTHER): Payer: Medicare HMO

## 2021-12-14 ENCOUNTER — Encounter: Payer: Self-pay | Admitting: Internal Medicine

## 2021-12-14 DIAGNOSIS — Z950 Presence of cardiac pacemaker: Secondary | ICD-10-CM

## 2021-12-14 DIAGNOSIS — I5022 Chronic systolic (congestive) heart failure: Secondary | ICD-10-CM

## 2021-12-15 NOTE — Progress Notes (Signed)
EPIC Encounter for ICM Monitoring  Patient Name: Alexandra [name] is a 88 y.o. female Date: 12/15/2021 Primary Care Physican: Lawerance Cruel, MD Primary Cardiologist: Skains/Bensimhon Electrophysiologist: Vergie Living Pacing:  98%   02/04/2021 Weight: 162 lbs 04/22/2021 Weight: 168 lbs 11/30/2021 Office Weight: 168 lbs                                                                 Spoke with patient and heart failure questions reviewed.  Transmission results reviewed.  Pt asymptomatic for fluid accumulation.  Decreased impedance may be related to holiday foods.   Pt reports HF clinic voice concern that the latest echo was difficult to read but didn't get any follow up instructions about needing a new echo.  Read echo notes from Tommye Standard PA which were called to patient on 10/27.  She verbalized understanding the EF was a little lower, 45% but concerned if that was accurate since she was told the results were difficult to read.     Corvue Thoracic impedance normal but was suggesting possible fluid accumulation starting 11/24 and trending back close to baseline.   Prescribed:  Furosemide 40 mg Take one tablet (40 mg) by mouth once every other day. May take extra as needed.   Potassium 10 mEq 1 tablet daily.   Labs: 10/21/2021 Creatinine 1.46, BUN 47, Potassium 4.7, Sodium 140, GFR 32-34 A complete set of results can be found in Results Review.   Recommendations:  No changes and encouraged to call if experiencing any fluid symptoms.  Advised would send message to Marlyce Huge, PA at HF clinic about her concerns about the echo.     Follow-up plan: ICM clinic phone appointment on 12/21/2021 to recheck fluid levels.  91 day device clinic remote transmission 02/12/2022.     EP/Cardiology Office Visits:  Recall 10/16/2022 with Dr Caryl Comes.   Recall 03/25/2022 with Dr Haroldine Laws   Copy of ICM check sent to Dr. Caryl Comes.     3 month ICM trend: 12/14/2021.    12-14 Month ICM trend:     [name], RN 12/15/2021 12:02 PM

## 2021-12-18 NOTE — Progress Notes (Signed)
From: Joette Catching, PA-C  Sent: 12/15/2021   1:48 PM EST  To: [name], RN  Subject: RE: Echo results                               I had told her that echo was a technically difficult study but that I didn't think repeating an echo would change management. EF was slightly lower on most recent study but she told me she would not want any invasive workup so not a lot of utility in rechecking.

## 2021-12-18 NOTE — Progress Notes (Signed)
Spoke with patient and advised of Lindsay's clarification regarding most recent echo.  She verbalized understanding and was appreciative of the information.

## 2021-12-21 ENCOUNTER — Ambulatory Visit (INDEPENDENT_AMBULATORY_CARE_PROVIDER_SITE_OTHER): Payer: Medicare HMO

## 2021-12-21 DIAGNOSIS — Z950 Presence of cardiac pacemaker: Secondary | ICD-10-CM

## 2021-12-21 DIAGNOSIS — I5022 Chronic systolic (congestive) heart failure: Secondary | ICD-10-CM

## 2021-12-22 NOTE — Progress Notes (Signed)
EPIC Encounter for ICM Monitoring  Patient Name: Alexandra [name] is a 88 y.o. female Date: 12/22/2021 Primary Care Physican: Lawerance Cruel, MD Primary Cardiologist: Skains/Bensimhon Electrophysiologist: Vergie Living Pacing:  98%   02/04/2021 Weight: 162 lbs 04/22/2021 Weight: 168 lbs 11/30/2021 Office Weight: 168 lbs                                                                 Spoke with patient and heart failure questions reviewed.  Transmission results reviewed.  Pt asymptomatic for fluid accumulation.            Corvue Thoracic impedance suggesting fluid levels returned to normal.   Prescribed:  Furosemide 40 mg Take one tablet (40 mg) by mouth once every other day. May take extra as needed.   Potassium 10 mEq 1 tablet daily.   Labs: 10/21/2021 Creatinine 1.46, BUN 47, Potassium 4.7, Sodium 140, GFR 32-34 A complete set of results can be found in Results Review.   Recommendations:  No changes and encouraged to call if experiencing any fluid symptoms.      Follow-up plan: ICM clinic phone appointment on 02/01/2022.  91 day device clinic remote transmission 02/12/2022.     EP/Cardiology Office Visits:  Recall 10/16/2022 with Dr Caryl Comes.   Recall 03/25/2022 with Dr Haroldine Laws   Copy of ICM check sent to Dr. Caryl Comes.     3 month ICM trend: 12/21/2021.    12-14 Month ICM trend:     [name], RN 12/22/2021 3:47 PM

## 2022-01-04 ENCOUNTER — Telehealth (HOSPITAL_COMMUNITY): Payer: Self-pay

## 2022-01-04 NOTE — Telephone Encounter (Signed)
Advanced Heart Failure Patient Advocate Encounter  Medication Samples have been left at registration desk for patient pick up. Drug name: Eliquis 2.'5MG'$  Qty: 2x 14 ct package LOT: [id number] Exp.: 02/24 SIG: Take 1 tablet by mouth twice daily   The patient has been instructed regarding the correct time, dose, and frequency of taking this medication, including desired effects and most common side effects.   Clista Bernhardt, CPhT Rx Patient Advocate Phone: [phone]

## 2022-02-01 ENCOUNTER — Ambulatory Visit (INDEPENDENT_AMBULATORY_CARE_PROVIDER_SITE_OTHER): Payer: Medicare HMO

## 2022-02-01 DIAGNOSIS — Z950 Presence of cardiac pacemaker: Secondary | ICD-10-CM | POA: Diagnosis not present

## 2022-02-01 DIAGNOSIS — I5022 Chronic systolic (congestive) heart failure: Secondary | ICD-10-CM

## 2022-02-04 ENCOUNTER — Telehealth: Payer: Self-pay

## 2022-02-04 NOTE — Telephone Encounter (Signed)
Remote ICM transmission received.  Attempted call to patient regarding ICM remote transmission and no answer or voice mail option.  

## 2022-02-04 NOTE — Progress Notes (Signed)
EPIC Encounter for ICM Monitoring  Patient Name: Alexandra [name] is a 89 y.o. female Date: 02/04/2022 Primary Care Physican: Lawerance Cruel, MD Primary Cardiologist: Skains/Bensimhon Electrophysiologist: Vergie Living Pacing:  98%   02/04/2021 Weight: 162 lbs 04/22/2021 Weight: 168 lbs 11/30/2021 Office Weight: 168 lbs                                                                 Attempted call to patient and unable to reach.   Transmission reviewed.            Corvue Thoracic impedance suggesting normal fluid levels with exception of 12/28-1/3.   Prescribed:  Furosemide 40 mg Take one tablet (40 mg) by mouth once every other day. May take extra as needed.   Potassium 10 mEq 1 tablet daily.   Labs: 10/21/2021 Creatinine 1.46, BUN 47, Potassium 4.7, Sodium 140, GFR 32-34 A complete set of results can be found in Results Review.   Recommendations:  Unable to reach.       Follow-up plan: ICM clinic phone appointment on 03/09/2022.  91 day device clinic remote transmission 05/14/2022.     EP/Cardiology Office Visits:  Recall 10/16/2022 with Dr Caryl Comes.   Recall 05/29/2022 with Dr Haroldine Laws   Copy of ICM check sent to Dr. Caryl Comes.     3 month ICM trend: 02/01/2022.    12-14 Month ICM trend:     [name], RN 02/04/2022 12:25 PM

## 2022-02-12 ENCOUNTER — Ambulatory Visit (INDEPENDENT_AMBULATORY_CARE_PROVIDER_SITE_OTHER): Payer: Medicare HMO

## 2022-02-12 DIAGNOSIS — I428 Other cardiomyopathies: Secondary | ICD-10-CM | POA: Diagnosis not present

## 2022-02-12 LAB — CUP PACEART REMOTE DEVICE CHECK
Battery Remaining Longevity: 92 mo
Battery Remaining Percentage: 95.5 %
Battery Voltage: 3.01 V
Brady Statistic AP VP Percent: 1 %
Brady Statistic AP VS Percent: 1 %
Brady Statistic AS VP Percent: 97 %
Brady Statistic AS VS Percent: 1.7 %
Brady Statistic RA Percent Paced: 1 %
Date Time Interrogation Session: 20240126064401
Implantable Lead Connection Status: 753985
Implantable Lead Connection Status: 753985
Implantable Lead Connection Status: 753985
Implantable Lead Implant Date: 20161229
Implantable Lead Implant Date: 20161229
Implantable Lead Implant Date: 20161229
Implantable Lead Location: 753858
Implantable Lead Location: 753859
Implantable Lead Location: 753860
Implantable Pulse Generator Implant Date: 20230728
Lead Channel Impedance Value: 390 Ohm
Lead Channel Impedance Value: 400 Ohm
Lead Channel Impedance Value: 930 Ohm
Lead Channel Pacing Threshold Amplitude: 0.625 V
Lead Channel Pacing Threshold Amplitude: 0.875 V
Lead Channel Pacing Threshold Amplitude: 0.875 V
Lead Channel Pacing Threshold Pulse Width: 0.5 ms
Lead Channel Pacing Threshold Pulse Width: 0.5 ms
Lead Channel Pacing Threshold Pulse Width: 0.5 ms
Lead Channel Sensing Intrinsic Amplitude: 1.5 mV
Lead Channel Sensing Intrinsic Amplitude: 12 mV
Lead Channel Setting Pacing Amplitude: 1.625
Lead Channel Setting Pacing Amplitude: 2 V
Lead Channel Setting Pacing Amplitude: 2 V
Lead Channel Setting Pacing Pulse Width: 0.5 ms
Lead Channel Setting Pacing Pulse Width: 0.5 ms
Lead Channel Setting Sensing Sensitivity: 2 mV
Pulse Gen Model: 3562
Pulse Gen Serial Number: 8101642

## 2022-02-27 DIAGNOSIS — M79671 Pain in right foot: Secondary | ICD-10-CM | POA: Diagnosis not present

## 2022-02-27 DIAGNOSIS — M25551 Pain in right hip: Secondary | ICD-10-CM | POA: Diagnosis not present

## 2022-03-01 ENCOUNTER — Telehealth: Payer: Self-pay

## 2022-03-01 NOTE — Telephone Encounter (Signed)
Patient's daughter Liliani Wojtas called triage. Tayven fell and fractured her foot his past weekend. She was seen at an ortho urgent care. She was told that she fractured her great toe and also has a Jones fracture. She is currently in a cam boot. She is suppose to follow up in 10days. She has an appointment with them, but really wants to see Dr.Blackman because he takes care of her ortho needs. 10days would be the 20th. Can you review your schedule and see if Dr.Blackman or Artis Delay is willing to work her in? Call back [phone] Thanks!

## 2022-03-01 NOTE — Telephone Encounter (Signed)
Patient scheduled next Thursday

## 2022-03-01 NOTE — Progress Notes (Signed)
Remote pacemaker transmission.   

## 2022-03-02 DIAGNOSIS — S92353D Displaced fracture of fifth metatarsal bone, unspecified foot, subsequent encounter for fracture with routine healing: Secondary | ICD-10-CM | POA: Diagnosis not present

## 2022-03-02 DIAGNOSIS — Z7409 Other reduced mobility: Secondary | ICD-10-CM | POA: Diagnosis not present

## 2022-03-09 ENCOUNTER — Ambulatory Visit: Payer: Medicare HMO | Attending: Internal Medicine

## 2022-03-09 DIAGNOSIS — I5022 Chronic systolic (congestive) heart failure: Secondary | ICD-10-CM | POA: Diagnosis not present

## 2022-03-09 DIAGNOSIS — I130 Hypertensive heart and chronic kidney disease with heart failure and stage 1 through stage 4 chronic kidney disease, or unspecified chronic kidney disease: Secondary | ICD-10-CM | POA: Diagnosis not present

## 2022-03-09 DIAGNOSIS — J4520 Mild intermittent asthma, uncomplicated: Secondary | ICD-10-CM | POA: Diagnosis not present

## 2022-03-09 DIAGNOSIS — D631 Anemia in chronic kidney disease: Secondary | ICD-10-CM | POA: Diagnosis not present

## 2022-03-09 DIAGNOSIS — G629 Polyneuropathy, unspecified: Secondary | ICD-10-CM | POA: Diagnosis not present

## 2022-03-09 DIAGNOSIS — Z950 Presence of cardiac pacemaker: Secondary | ICD-10-CM | POA: Diagnosis not present

## 2022-03-09 DIAGNOSIS — S92351D Displaced fracture of fifth metatarsal bone, right foot, subsequent encounter for fracture with routine healing: Secondary | ICD-10-CM | POA: Diagnosis not present

## 2022-03-09 DIAGNOSIS — S92311D Displaced fracture of first metatarsal bone, right foot, subsequent encounter for fracture with routine healing: Secondary | ICD-10-CM | POA: Diagnosis not present

## 2022-03-09 DIAGNOSIS — F430 Acute stress reaction: Secondary | ICD-10-CM | POA: Diagnosis not present

## 2022-03-09 DIAGNOSIS — N184 Chronic kidney disease, stage 4 (severe): Secondary | ICD-10-CM | POA: Diagnosis not present

## 2022-03-09 DIAGNOSIS — I509 Heart failure, unspecified: Secondary | ICD-10-CM | POA: Diagnosis not present

## 2022-03-09 NOTE — Progress Notes (Signed)
EPIC Encounter for ICM Monitoring  Patient Name: Alexandra [name] is a 89 y.o. female Date: 03/09/2022 Primary Care Physican: Lawerance Cruel, MD Primary Cardiologist: Skains/Bensimhon Electrophysiologist: Vergie Living Pacing:  98%   02/04/2021 Weight: 162 lbs 04/22/2021 Weight: 168 lbs 11/30/2021 Office Weight: 168 lbs 03/09/2022 Weight: 163 lbs                                                                 Spoke with patient and heart failure questions reviewed.  Transmission results reviewed.  Pt asymptomatic for fluid accumulation.  Reports feeling well at this time and voices no complaints.           Corvue Thoracic impedance suggesting normal fluid levels.   Prescribed:  Furosemide 40 mg Take one tablet (40 mg) by mouth once every other day. May take extra as needed.   Potassium 10 mEq 1 tablet daily.   Labs: 10/21/2021 Creatinine 1.46, BUN 47, Potassium 4.7, Sodium 140, GFR 32-34 A complete set of results can be found in Results Review.   Recommendations:  No changes and encouraged to call if experiencing any fluid symptoms.    Follow-up plan: ICM clinic phone appointment on 04/12/2022.  91 day device clinic remote transmission 05/14/2022.     EP/Cardiology Office Visits:  Recall 10/16/2022 with Dr Caryl Comes.   Recall 05/29/2022 with Dr Haroldine Laws   Copy of ICM check sent to Dr. Caryl Comes.     3 month ICM trend: 03/07/2022.    [name], RN 03/09/2022 9:24 AM

## 2022-03-10 DIAGNOSIS — S92351D Displaced fracture of fifth metatarsal bone, right foot, subsequent encounter for fracture with routine healing: Secondary | ICD-10-CM | POA: Diagnosis not present

## 2022-03-10 DIAGNOSIS — J4520 Mild intermittent asthma, uncomplicated: Secondary | ICD-10-CM | POA: Diagnosis not present

## 2022-03-10 DIAGNOSIS — F430 Acute stress reaction: Secondary | ICD-10-CM | POA: Diagnosis not present

## 2022-03-10 DIAGNOSIS — I130 Hypertensive heart and chronic kidney disease with heart failure and stage 1 through stage 4 chronic kidney disease, or unspecified chronic kidney disease: Secondary | ICD-10-CM | POA: Diagnosis not present

## 2022-03-10 DIAGNOSIS — G629 Polyneuropathy, unspecified: Secondary | ICD-10-CM | POA: Diagnosis not present

## 2022-03-10 DIAGNOSIS — N184 Chronic kidney disease, stage 4 (severe): Secondary | ICD-10-CM | POA: Diagnosis not present

## 2022-03-10 DIAGNOSIS — D631 Anemia in chronic kidney disease: Secondary | ICD-10-CM | POA: Diagnosis not present

## 2022-03-10 DIAGNOSIS — S92311D Displaced fracture of first metatarsal bone, right foot, subsequent encounter for fracture with routine healing: Secondary | ICD-10-CM | POA: Diagnosis not present

## 2022-03-10 DIAGNOSIS — I509 Heart failure, unspecified: Secondary | ICD-10-CM | POA: Diagnosis not present

## 2022-03-11 ENCOUNTER — Ambulatory Visit: Payer: Self-pay

## 2022-03-11 ENCOUNTER — Encounter: Payer: Self-pay | Admitting: Physician Assistant

## 2022-03-11 ENCOUNTER — Ambulatory Visit: Payer: Medicare HMO | Admitting: Physician Assistant

## 2022-03-11 DIAGNOSIS — M79671 Pain in right foot: Secondary | ICD-10-CM

## 2022-03-11 DIAGNOSIS — S92351D Displaced fracture of fifth metatarsal bone, right foot, subsequent encounter for fracture with routine healing: Secondary | ICD-10-CM

## 2022-03-11 DIAGNOSIS — S92351A Displaced fracture of fifth metatarsal bone, right foot, initial encounter for closed fracture: Secondary | ICD-10-CM

## 2022-03-11 NOTE — Progress Notes (Signed)
HPI: [name] is 89 year old female comes in today with right foot fifth metatarsal fracture.  She fell 3 weeks ago.  She is placed in a cam walker boot and sent here for follow-up.  She been mainly heel weightbearing.  She has really been taking nothing for pain a week ago she took a couple oxycodone that she had leftover previous prescription.  Not having a lot of pain at this point in time.  She denies any other injuries.  This was a mechanical fall 3 weeks.  No loss of consciousness or dizziness.  Review of systems: See HPI otherwise negative  Physical exam: General well-developed well-nourished pleasant female in no acute distress mood and affect appropriate. Psych: Alert and oriented x 3  Right foot dorsal pedal pulse 2+ sensation grossly intact.  Arthritic changes throughout the toes third toe with significant ecchymosis.  Fifth toe overlaps the fourth toe.  There is no rashes skin lesions ulcerations or impending ulcers.  She has tenderness at the base of the fifth metatarsal.  Diffuse tenderness throughout the foot.  Right foot 3 views shows minimally displaced zone 2/5 metatarsal base fracture.  No other acute fractures throughout the foot.  Again arthritic changes are noted throughout the toes and there is a crossover deformity of the fifth toe on the fourth.  Impression: Right fifth metatarsal base fracture  Plan: She is weightbearing as tolerated cam walker boot through the heel.  Elevation wiggling toes encouraged.  She can come out of the boot for hygiene purposes.  See her back in just 2 weeks for repeat x-rays.  Questions were encouraged and answered at length today.  She can take Tylenol for pain.  She is on Eliquis chronically.

## 2022-03-12 DIAGNOSIS — G629 Polyneuropathy, unspecified: Secondary | ICD-10-CM | POA: Diagnosis not present

## 2022-03-12 DIAGNOSIS — D631 Anemia in chronic kidney disease: Secondary | ICD-10-CM | POA: Diagnosis not present

## 2022-03-12 DIAGNOSIS — I130 Hypertensive heart and chronic kidney disease with heart failure and stage 1 through stage 4 chronic kidney disease, or unspecified chronic kidney disease: Secondary | ICD-10-CM | POA: Diagnosis not present

## 2022-03-12 DIAGNOSIS — N184 Chronic kidney disease, stage 4 (severe): Secondary | ICD-10-CM | POA: Diagnosis not present

## 2022-03-12 DIAGNOSIS — I509 Heart failure, unspecified: Secondary | ICD-10-CM | POA: Diagnosis not present

## 2022-03-12 DIAGNOSIS — S92351D Displaced fracture of fifth metatarsal bone, right foot, subsequent encounter for fracture with routine healing: Secondary | ICD-10-CM | POA: Diagnosis not present

## 2022-03-12 DIAGNOSIS — S92311D Displaced fracture of first metatarsal bone, right foot, subsequent encounter for fracture with routine healing: Secondary | ICD-10-CM | POA: Diagnosis not present

## 2022-03-12 DIAGNOSIS — F430 Acute stress reaction: Secondary | ICD-10-CM | POA: Diagnosis not present

## 2022-03-12 DIAGNOSIS — J4520 Mild intermittent asthma, uncomplicated: Secondary | ICD-10-CM | POA: Diagnosis not present

## 2022-03-15 DIAGNOSIS — N184 Chronic kidney disease, stage 4 (severe): Secondary | ICD-10-CM | POA: Diagnosis not present

## 2022-03-15 DIAGNOSIS — I130 Hypertensive heart and chronic kidney disease with heart failure and stage 1 through stage 4 chronic kidney disease, or unspecified chronic kidney disease: Secondary | ICD-10-CM | POA: Diagnosis not present

## 2022-03-15 DIAGNOSIS — I509 Heart failure, unspecified: Secondary | ICD-10-CM | POA: Diagnosis not present

## 2022-03-15 DIAGNOSIS — S92351D Displaced fracture of fifth metatarsal bone, right foot, subsequent encounter for fracture with routine healing: Secondary | ICD-10-CM | POA: Diagnosis not present

## 2022-03-15 DIAGNOSIS — S92311D Displaced fracture of first metatarsal bone, right foot, subsequent encounter for fracture with routine healing: Secondary | ICD-10-CM | POA: Diagnosis not present

## 2022-03-15 DIAGNOSIS — F430 Acute stress reaction: Secondary | ICD-10-CM | POA: Diagnosis not present

## 2022-03-15 DIAGNOSIS — J4520 Mild intermittent asthma, uncomplicated: Secondary | ICD-10-CM | POA: Diagnosis not present

## 2022-03-15 DIAGNOSIS — D631 Anemia in chronic kidney disease: Secondary | ICD-10-CM | POA: Diagnosis not present

## 2022-03-15 DIAGNOSIS — G629 Polyneuropathy, unspecified: Secondary | ICD-10-CM | POA: Diagnosis not present

## 2022-03-16 DIAGNOSIS — D631 Anemia in chronic kidney disease: Secondary | ICD-10-CM | POA: Diagnosis not present

## 2022-03-16 DIAGNOSIS — G629 Polyneuropathy, unspecified: Secondary | ICD-10-CM | POA: Diagnosis not present

## 2022-03-16 DIAGNOSIS — S92311D Displaced fracture of first metatarsal bone, right foot, subsequent encounter for fracture with routine healing: Secondary | ICD-10-CM | POA: Diagnosis not present

## 2022-03-16 DIAGNOSIS — I130 Hypertensive heart and chronic kidney disease with heart failure and stage 1 through stage 4 chronic kidney disease, or unspecified chronic kidney disease: Secondary | ICD-10-CM | POA: Diagnosis not present

## 2022-03-16 DIAGNOSIS — I509 Heart failure, unspecified: Secondary | ICD-10-CM | POA: Diagnosis not present

## 2022-03-16 DIAGNOSIS — F430 Acute stress reaction: Secondary | ICD-10-CM | POA: Diagnosis not present

## 2022-03-16 DIAGNOSIS — N184 Chronic kidney disease, stage 4 (severe): Secondary | ICD-10-CM | POA: Diagnosis not present

## 2022-03-16 DIAGNOSIS — J4520 Mild intermittent asthma, uncomplicated: Secondary | ICD-10-CM | POA: Diagnosis not present

## 2022-03-16 DIAGNOSIS — S92351D Displaced fracture of fifth metatarsal bone, right foot, subsequent encounter for fracture with routine healing: Secondary | ICD-10-CM | POA: Diagnosis not present

## 2022-03-17 DIAGNOSIS — S92311D Displaced fracture of first metatarsal bone, right foot, subsequent encounter for fracture with routine healing: Secondary | ICD-10-CM | POA: Diagnosis not present

## 2022-03-17 DIAGNOSIS — I509 Heart failure, unspecified: Secondary | ICD-10-CM | POA: Diagnosis not present

## 2022-03-17 DIAGNOSIS — D631 Anemia in chronic kidney disease: Secondary | ICD-10-CM | POA: Diagnosis not present

## 2022-03-17 DIAGNOSIS — F430 Acute stress reaction: Secondary | ICD-10-CM | POA: Diagnosis not present

## 2022-03-17 DIAGNOSIS — N184 Chronic kidney disease, stage 4 (severe): Secondary | ICD-10-CM | POA: Diagnosis not present

## 2022-03-17 DIAGNOSIS — I130 Hypertensive heart and chronic kidney disease with heart failure and stage 1 through stage 4 chronic kidney disease, or unspecified chronic kidney disease: Secondary | ICD-10-CM | POA: Diagnosis not present

## 2022-03-17 DIAGNOSIS — S92351D Displaced fracture of fifth metatarsal bone, right foot, subsequent encounter for fracture with routine healing: Secondary | ICD-10-CM | POA: Diagnosis not present

## 2022-03-17 DIAGNOSIS — J4520 Mild intermittent asthma, uncomplicated: Secondary | ICD-10-CM | POA: Diagnosis not present

## 2022-03-17 DIAGNOSIS — G629 Polyneuropathy, unspecified: Secondary | ICD-10-CM | POA: Diagnosis not present

## 2022-03-19 DIAGNOSIS — I509 Heart failure, unspecified: Secondary | ICD-10-CM | POA: Diagnosis not present

## 2022-03-19 DIAGNOSIS — S92311D Displaced fracture of first metatarsal bone, right foot, subsequent encounter for fracture with routine healing: Secondary | ICD-10-CM | POA: Diagnosis not present

## 2022-03-19 DIAGNOSIS — I130 Hypertensive heart and chronic kidney disease with heart failure and stage 1 through stage 4 chronic kidney disease, or unspecified chronic kidney disease: Secondary | ICD-10-CM | POA: Diagnosis not present

## 2022-03-19 DIAGNOSIS — S92351D Displaced fracture of fifth metatarsal bone, right foot, subsequent encounter for fracture with routine healing: Secondary | ICD-10-CM | POA: Diagnosis not present

## 2022-03-19 DIAGNOSIS — F430 Acute stress reaction: Secondary | ICD-10-CM | POA: Diagnosis not present

## 2022-03-19 DIAGNOSIS — G629 Polyneuropathy, unspecified: Secondary | ICD-10-CM | POA: Diagnosis not present

## 2022-03-19 DIAGNOSIS — J4520 Mild intermittent asthma, uncomplicated: Secondary | ICD-10-CM | POA: Diagnosis not present

## 2022-03-19 DIAGNOSIS — D631 Anemia in chronic kidney disease: Secondary | ICD-10-CM | POA: Diagnosis not present

## 2022-03-19 DIAGNOSIS — N184 Chronic kidney disease, stage 4 (severe): Secondary | ICD-10-CM | POA: Diagnosis not present

## 2022-03-22 DIAGNOSIS — S92311D Displaced fracture of first metatarsal bone, right foot, subsequent encounter for fracture with routine healing: Secondary | ICD-10-CM | POA: Diagnosis not present

## 2022-03-22 DIAGNOSIS — D631 Anemia in chronic kidney disease: Secondary | ICD-10-CM | POA: Diagnosis not present

## 2022-03-22 DIAGNOSIS — I509 Heart failure, unspecified: Secondary | ICD-10-CM | POA: Diagnosis not present

## 2022-03-22 DIAGNOSIS — F430 Acute stress reaction: Secondary | ICD-10-CM | POA: Diagnosis not present

## 2022-03-22 DIAGNOSIS — G629 Polyneuropathy, unspecified: Secondary | ICD-10-CM | POA: Diagnosis not present

## 2022-03-22 DIAGNOSIS — J4520 Mild intermittent asthma, uncomplicated: Secondary | ICD-10-CM | POA: Diagnosis not present

## 2022-03-22 DIAGNOSIS — N184 Chronic kidney disease, stage 4 (severe): Secondary | ICD-10-CM | POA: Diagnosis not present

## 2022-03-22 DIAGNOSIS — I130 Hypertensive heart and chronic kidney disease with heart failure and stage 1 through stage 4 chronic kidney disease, or unspecified chronic kidney disease: Secondary | ICD-10-CM | POA: Diagnosis not present

## 2022-03-22 DIAGNOSIS — S92351D Displaced fracture of fifth metatarsal bone, right foot, subsequent encounter for fracture with routine healing: Secondary | ICD-10-CM | POA: Diagnosis not present

## 2022-03-23 DIAGNOSIS — F430 Acute stress reaction: Secondary | ICD-10-CM | POA: Diagnosis not present

## 2022-03-23 DIAGNOSIS — G629 Polyneuropathy, unspecified: Secondary | ICD-10-CM | POA: Diagnosis not present

## 2022-03-23 DIAGNOSIS — N184 Chronic kidney disease, stage 4 (severe): Secondary | ICD-10-CM | POA: Diagnosis not present

## 2022-03-23 DIAGNOSIS — S92351D Displaced fracture of fifth metatarsal bone, right foot, subsequent encounter for fracture with routine healing: Secondary | ICD-10-CM | POA: Diagnosis not present

## 2022-03-23 DIAGNOSIS — J4520 Mild intermittent asthma, uncomplicated: Secondary | ICD-10-CM | POA: Diagnosis not present

## 2022-03-23 DIAGNOSIS — I509 Heart failure, unspecified: Secondary | ICD-10-CM | POA: Diagnosis not present

## 2022-03-23 DIAGNOSIS — I130 Hypertensive heart and chronic kidney disease with heart failure and stage 1 through stage 4 chronic kidney disease, or unspecified chronic kidney disease: Secondary | ICD-10-CM | POA: Diagnosis not present

## 2022-03-23 DIAGNOSIS — D631 Anemia in chronic kidney disease: Secondary | ICD-10-CM | POA: Diagnosis not present

## 2022-03-23 DIAGNOSIS — S92311D Displaced fracture of first metatarsal bone, right foot, subsequent encounter for fracture with routine healing: Secondary | ICD-10-CM | POA: Diagnosis not present

## 2022-03-24 DIAGNOSIS — S92351D Displaced fracture of fifth metatarsal bone, right foot, subsequent encounter for fracture with routine healing: Secondary | ICD-10-CM | POA: Diagnosis not present

## 2022-03-24 DIAGNOSIS — I509 Heart failure, unspecified: Secondary | ICD-10-CM | POA: Diagnosis not present

## 2022-03-24 DIAGNOSIS — S92311D Displaced fracture of first metatarsal bone, right foot, subsequent encounter for fracture with routine healing: Secondary | ICD-10-CM | POA: Diagnosis not present

## 2022-03-24 DIAGNOSIS — N184 Chronic kidney disease, stage 4 (severe): Secondary | ICD-10-CM | POA: Diagnosis not present

## 2022-03-24 DIAGNOSIS — G629 Polyneuropathy, unspecified: Secondary | ICD-10-CM | POA: Diagnosis not present

## 2022-03-24 DIAGNOSIS — I130 Hypertensive heart and chronic kidney disease with heart failure and stage 1 through stage 4 chronic kidney disease, or unspecified chronic kidney disease: Secondary | ICD-10-CM | POA: Diagnosis not present

## 2022-03-24 DIAGNOSIS — D631 Anemia in chronic kidney disease: Secondary | ICD-10-CM | POA: Diagnosis not present

## 2022-03-24 DIAGNOSIS — F430 Acute stress reaction: Secondary | ICD-10-CM | POA: Diagnosis not present

## 2022-03-24 DIAGNOSIS — J4520 Mild intermittent asthma, uncomplicated: Secondary | ICD-10-CM | POA: Diagnosis not present

## 2022-03-25 ENCOUNTER — Encounter: Payer: Self-pay | Admitting: Radiology

## 2022-03-26 DIAGNOSIS — G629 Polyneuropathy, unspecified: Secondary | ICD-10-CM | POA: Diagnosis not present

## 2022-03-26 DIAGNOSIS — N184 Chronic kidney disease, stage 4 (severe): Secondary | ICD-10-CM | POA: Diagnosis not present

## 2022-03-26 DIAGNOSIS — S92311D Displaced fracture of first metatarsal bone, right foot, subsequent encounter for fracture with routine healing: Secondary | ICD-10-CM | POA: Diagnosis not present

## 2022-03-26 DIAGNOSIS — J4520 Mild intermittent asthma, uncomplicated: Secondary | ICD-10-CM | POA: Diagnosis not present

## 2022-03-26 DIAGNOSIS — I509 Heart failure, unspecified: Secondary | ICD-10-CM | POA: Diagnosis not present

## 2022-03-26 DIAGNOSIS — S92351D Displaced fracture of fifth metatarsal bone, right foot, subsequent encounter for fracture with routine healing: Secondary | ICD-10-CM | POA: Diagnosis not present

## 2022-03-26 DIAGNOSIS — F430 Acute stress reaction: Secondary | ICD-10-CM | POA: Diagnosis not present

## 2022-03-26 DIAGNOSIS — D631 Anemia in chronic kidney disease: Secondary | ICD-10-CM | POA: Diagnosis not present

## 2022-03-26 DIAGNOSIS — I130 Hypertensive heart and chronic kidney disease with heart failure and stage 1 through stage 4 chronic kidney disease, or unspecified chronic kidney disease: Secondary | ICD-10-CM | POA: Diagnosis not present

## 2022-03-29 DIAGNOSIS — G629 Polyneuropathy, unspecified: Secondary | ICD-10-CM | POA: Diagnosis not present

## 2022-03-29 DIAGNOSIS — N184 Chronic kidney disease, stage 4 (severe): Secondary | ICD-10-CM | POA: Diagnosis not present

## 2022-03-29 DIAGNOSIS — J4520 Mild intermittent asthma, uncomplicated: Secondary | ICD-10-CM | POA: Diagnosis not present

## 2022-03-29 DIAGNOSIS — S92311D Displaced fracture of first metatarsal bone, right foot, subsequent encounter for fracture with routine healing: Secondary | ICD-10-CM | POA: Diagnosis not present

## 2022-03-29 DIAGNOSIS — F430 Acute stress reaction: Secondary | ICD-10-CM | POA: Diagnosis not present

## 2022-03-29 DIAGNOSIS — I509 Heart failure, unspecified: Secondary | ICD-10-CM | POA: Diagnosis not present

## 2022-03-29 DIAGNOSIS — D631 Anemia in chronic kidney disease: Secondary | ICD-10-CM | POA: Diagnosis not present

## 2022-03-29 DIAGNOSIS — S92351D Displaced fracture of fifth metatarsal bone, right foot, subsequent encounter for fracture with routine healing: Secondary | ICD-10-CM | POA: Diagnosis not present

## 2022-03-29 DIAGNOSIS — I130 Hypertensive heart and chronic kidney disease with heart failure and stage 1 through stage 4 chronic kidney disease, or unspecified chronic kidney disease: Secondary | ICD-10-CM | POA: Diagnosis not present

## 2022-03-30 ENCOUNTER — Ambulatory Visit (INDEPENDENT_AMBULATORY_CARE_PROVIDER_SITE_OTHER): Payer: Medicare HMO

## 2022-03-30 ENCOUNTER — Encounter: Payer: Self-pay | Admitting: Physician Assistant

## 2022-03-30 ENCOUNTER — Ambulatory Visit: Payer: Medicare HMO | Admitting: Physician Assistant

## 2022-03-30 DIAGNOSIS — S92351D Displaced fracture of fifth metatarsal bone, right foot, subsequent encounter for fracture with routine healing: Secondary | ICD-10-CM

## 2022-03-30 DIAGNOSIS — M79661 Pain in right lower leg: Secondary | ICD-10-CM

## 2022-03-30 DIAGNOSIS — M1711 Unilateral primary osteoarthritis, right knee: Secondary | ICD-10-CM | POA: Diagnosis not present

## 2022-03-30 MED ORDER — METHYLPREDNISOLONE ACETATE 40 MG/ML IJ SUSP
40.0000 mg | INTRAMUSCULAR | Status: AC | PRN
  Administered 2022-03-30: 40 mg via INTRA_ARTICULAR

## 2022-03-30 MED ORDER — LIDOCAINE HCL 1 % IJ SOLN
3.0000 mL | INTRAMUSCULAR | Status: AC | PRN
  Administered 2022-03-30: 3 mL

## 2022-03-30 NOTE — Progress Notes (Signed)
Office Visit Note   Patient: Alexandra [name]           Date of Birth: March 22, 1927           MRN: [id number] Visit Date: 03/30/2022              Requested by: Lawerance Cruel, [place],  [place] [address] PCP: Lawerance Cruel, MD   Assessment & Plan: Visit Diagnoses:  1. Closed displaced fracture of fifth metatarsal bone of right foot with routine healing, subsequent encounter   2. Pain in right lower leg   3. Primary osteoarthritis of right knee     Plan: Reviewed radiographs of her right foot with her.  We will have her continue to use the cam walker boot whenever up ambulating.  Will see her back in 4 weeks for the right foot fifth metatarsal base fracture.  At follow-up we will obtain 3 views of her right foot.In regards to the knee we will see how she does with the knee injection which she tolerated well today.  In regards to her lower leg reviewed the radiographs and showed her that there is no acute fracture or signs of fracture involving the lower leg.  Felt that her knee pain and arthritis are exacerbated by the cam walker boot.  Follow-Up Instructions: Return in about 4 weeks (around 04/27/2022).   Orders:  Orders Placed This Encounter  Procedures   Large Joint Inj   XR Foot Complete Right   XR Tibia/Fibula Right   No orders of the defined types were placed in this encounter.     Procedures: Large Joint Inj: R knee on 03/30/2022 5:28 PM Indications: pain Details: 22 G 1.5 in needle, anterolateral approach  Arthrogram: No  Medications: 3 mL lidocaine 1 %; 40 mg methylPREDNISolone acetate 40 MG/ML Outcome: tolerated well, no immediate complications Procedure, treatment alternatives, risks and benefits explained, specific risks discussed. Consent was given by the patient. Immediately prior to procedure a time out was called to verify the correct patient, procedure, equipment, support staff and site/side marked as required. Patient was prepped  and draped in the usual sterile fashion.       Clinical Data: No additional findings.   Subjective: Chief Complaint  Patient presents with   Right Foot - Fracture, Follow-up    HPI [name] returns today for follow-up of her right foot base of fifth metatarsal fracture.  She been a cam walker boot.  She is now having pain in her right lower leg no new injury.  Pain since from the knee down into the shin of the right leg.  States her foot overall is doing well.  She is now approximately 5 weeks status post injury to the right foot.  Patient has known tricompartmental arthritis with near bone-on-bone lateral compartment of the right knee.  Moderate arthritis medial compartment and severe patellofemoral arthritis on previous x-rays.  Review of Systems See HPI  Objective: Vital Signs: There were no vitals taken for this visit.  Physical Exam Constitutional:      Appearance: She is not ill-appearing or diaphoretic.  Pulmonary:     Effort: Pulmonary effort is normal.  Neurological:     Mental Status: She is alert.  Psychiatric:        Mood and Affect: Mood normal.     Ortho Exam Right knee she has tenderness globally.  Tenderness over the proximal tibia and down the tibial spine to about midshaft.  There is no ecchymosis erythema.  No effusion of the right knee.  Calf supple nontender.  Nontender over the right fibula. Right foot tenderness over the base of the fifth metatarsal. Specialty Comments:  No specialty comments available.  Imaging: XR Tibia/Fibula Right  Result Date: 03/30/2022 Right tib-fib 2 views: No acute fractures bony abnormalities.  Knee joint ankle joint are well located.  Arthritic changes noted involving the lateral compartment and patellofemoral compartment right knee.  XR Foot Complete Right  Result Date: 03/30/2022 Right foot 3 views: Shows no change in overall position alignment of the right fifth base metatarsal fracture.  There are some early  evidence of consolidation.  Fracture still evident.  No other fractures identified.    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 03/18/2020   Unilateral primary osteoarthritis, right knee 07/04/2017   Dyspnea    Chronic systolic heart failure (HCC)    Pain in left shin 02/07/2015   Leukocytosis 0000000   Chronic systolic CHF (congestive heart failure), NYHA class 3 ([place]) 01/16/2015   Protein-calorie malnutrition, severe ([place]) 01/15/2015   Infection due to Corynebacterium diphtheriae 12/24/2014   Bacteremia    Fever [id number]   Chronic systolic CHF (congestive heart failure), NYHA class 4 ([place]) 10/23/2014   Situational depression    Frequent headaches    Pulmonary embolism ([place]) 10/10/2014   Chronic anticoagulation 10/10/2014   Takotsubo syndrome 08/19/2014   HCAP (healthcare-associated pneumonia) 08/17/2014   H/O Asthma 08/16/2014   LBBB (left bundle branch block) 08/16/2014   Cardiomyopathy- etiology not yet determined 08/16/2014   PSVT (paroxysmal supraventricular tachycardia) 08/16/2014   CKD (chronic kidney disease) stage 3, GFR 30-59 ml/min (HCC) 08/13/2014   Anemia of chronic disease 08/13/2014   Hypothyroidism 08/13/2014   Acute cholecystitis 08/11/2014   Abdominal pain 08/11/2014   Essential (primary) hypertension 04/02/2014   Past Medical History:  Diagnosis Date   Adult hypothyroidism 04/02/2014   Anemia of chronic disease 08/13/2014   Anxiety    Arthritis    Asthma    breast ca dx'd 2000   left   Bursitis    right hip   CAP (community acquired pneumonia) 12/19/2012   Chest pain    NM stress test 05/2011 Normal   CHF (congestive heart failure) ([place]) 08/19/2014   CKD (chronic kidney disease) stage 3, GFR 30-59 ml/min (HCC) 08/13/2014   Depression    DOE (dyspnea on exertion)    No SOB  at rest   Essential (primary) hypertension 04/02/2014   Fatigue    excertional   [id number])    Hereditary and idiopathic peripheral neuropathy 07/02/2014   Hypercholesteremia    Intermittent LBBB (left bundle branch block) 12/21/2012   Leg weakness    Neuropathy    Obesity    Osteopenia    Peripheral neuropathy    Presence of permanent cardiac pacemaker 01/16/2015   BIV   Sacroiliitis, not elsewhere classified ([place])    Situational depression    Trigger finger    [name]    Family History  Problem Relation Age of Onset   Pneumonia Mother 59       cause of death   Diabetes Brother    Pancreatic cancer Father 66   Alzheimer's disease Sister    CVA Daughter        01/21/09   Stroke Daughter  Hypertension Daughter    Heart attack Neg Hx     Past Surgical History:  Procedure Laterality Date   ACHILLES TENDON SURGERY     BIV PACEMAKER GENERATOR CHANGEOUT N/A 08/14/2021   Procedure: BIV PACEMAKER GENERATOR CHANGEOUT;  Surgeon: Deboraha Sprang, MD;  Location: [facility];  Service: Cardiovascular;  Laterality: N/A;   BREAST LUMPECTOMY     breast cancer   CARDIAC CATHETERIZATION N/A 07/21/2015   Procedure: Right Heart Cath;  Surgeon: Jolaine Artist, MD;  Location: [facility];  Service: Cardiovascular;  Laterality: N/A;   CHOLECYSTECTOMY N/A 08/13/2014   Procedure: LAPAROSCOPIC CHOLECYSTECTOMY;  Surgeon: Stark Klein, MD;  Location: [facility];  Service: General;  Laterality: N/A;   EP IMPLANTABLE DEVICE N/A 01/16/2015   Procedure: BiV Pacemaker Insertion CRT-P;  Surgeon: Deboraha Sprang, MD;  Location: [facility];  Service: Cardiovascular;  Laterality: N/A;   LEG SURGERY     LUMBAR LAMINECTOMY     POLYPECTOMY     TONSILLECTOMY     VESICOVAGINAL FISTULA CLOSURE W/ TAH     DC hysterectomy   Social History   Occupational History   Occupation: Retired  Tobacco Use   Smoking status: Never   Smokeless tobacco: Never  Vaping Use   Vaping Use:  Never used  Substance and Sexual Activity   Alcohol use: No    Alcohol/week: 0.0 standard drinks of alcohol   Drug use: No   Sexual activity: Never

## 2022-03-31 DIAGNOSIS — F430 Acute stress reaction: Secondary | ICD-10-CM | POA: Diagnosis not present

## 2022-03-31 DIAGNOSIS — J4520 Mild intermittent asthma, uncomplicated: Secondary | ICD-10-CM | POA: Diagnosis not present

## 2022-03-31 DIAGNOSIS — I509 Heart failure, unspecified: Secondary | ICD-10-CM | POA: Diagnosis not present

## 2022-03-31 DIAGNOSIS — S92351D Displaced fracture of fifth metatarsal bone, right foot, subsequent encounter for fracture with routine healing: Secondary | ICD-10-CM | POA: Diagnosis not present

## 2022-03-31 DIAGNOSIS — G629 Polyneuropathy, unspecified: Secondary | ICD-10-CM | POA: Diagnosis not present

## 2022-03-31 DIAGNOSIS — S92311D Displaced fracture of first metatarsal bone, right foot, subsequent encounter for fracture with routine healing: Secondary | ICD-10-CM | POA: Diagnosis not present

## 2022-03-31 DIAGNOSIS — N184 Chronic kidney disease, stage 4 (severe): Secondary | ICD-10-CM | POA: Diagnosis not present

## 2022-03-31 DIAGNOSIS — D631 Anemia in chronic kidney disease: Secondary | ICD-10-CM | POA: Diagnosis not present

## 2022-03-31 DIAGNOSIS — I130 Hypertensive heart and chronic kidney disease with heart failure and stage 1 through stage 4 chronic kidney disease, or unspecified chronic kidney disease: Secondary | ICD-10-CM | POA: Diagnosis not present

## 2022-04-01 DIAGNOSIS — F430 Acute stress reaction: Secondary | ICD-10-CM | POA: Diagnosis not present

## 2022-04-01 DIAGNOSIS — J4520 Mild intermittent asthma, uncomplicated: Secondary | ICD-10-CM | POA: Diagnosis not present

## 2022-04-01 DIAGNOSIS — G629 Polyneuropathy, unspecified: Secondary | ICD-10-CM | POA: Diagnosis not present

## 2022-04-01 DIAGNOSIS — N184 Chronic kidney disease, stage 4 (severe): Secondary | ICD-10-CM | POA: Diagnosis not present

## 2022-04-01 DIAGNOSIS — I130 Hypertensive heart and chronic kidney disease with heart failure and stage 1 through stage 4 chronic kidney disease, or unspecified chronic kidney disease: Secondary | ICD-10-CM | POA: Diagnosis not present

## 2022-04-01 DIAGNOSIS — I509 Heart failure, unspecified: Secondary | ICD-10-CM | POA: Diagnosis not present

## 2022-04-01 DIAGNOSIS — S92311D Displaced fracture of first metatarsal bone, right foot, subsequent encounter for fracture with routine healing: Secondary | ICD-10-CM | POA: Diagnosis not present

## 2022-04-01 DIAGNOSIS — D631 Anemia in chronic kidney disease: Secondary | ICD-10-CM | POA: Diagnosis not present

## 2022-04-01 DIAGNOSIS — S92351D Displaced fracture of fifth metatarsal bone, right foot, subsequent encounter for fracture with routine healing: Secondary | ICD-10-CM | POA: Diagnosis not present

## 2022-04-05 DIAGNOSIS — I130 Hypertensive heart and chronic kidney disease with heart failure and stage 1 through stage 4 chronic kidney disease, or unspecified chronic kidney disease: Secondary | ICD-10-CM | POA: Diagnosis not present

## 2022-04-05 DIAGNOSIS — I509 Heart failure, unspecified: Secondary | ICD-10-CM | POA: Diagnosis not present

## 2022-04-05 DIAGNOSIS — F430 Acute stress reaction: Secondary | ICD-10-CM | POA: Diagnosis not present

## 2022-04-05 DIAGNOSIS — J4520 Mild intermittent asthma, uncomplicated: Secondary | ICD-10-CM | POA: Diagnosis not present

## 2022-04-05 DIAGNOSIS — D631 Anemia in chronic kidney disease: Secondary | ICD-10-CM | POA: Diagnosis not present

## 2022-04-05 DIAGNOSIS — S92351D Displaced fracture of fifth metatarsal bone, right foot, subsequent encounter for fracture with routine healing: Secondary | ICD-10-CM | POA: Diagnosis not present

## 2022-04-05 DIAGNOSIS — S92311D Displaced fracture of first metatarsal bone, right foot, subsequent encounter for fracture with routine healing: Secondary | ICD-10-CM | POA: Diagnosis not present

## 2022-04-05 DIAGNOSIS — G629 Polyneuropathy, unspecified: Secondary | ICD-10-CM | POA: Diagnosis not present

## 2022-04-05 DIAGNOSIS — N184 Chronic kidney disease, stage 4 (severe): Secondary | ICD-10-CM | POA: Diagnosis not present

## 2022-04-06 DIAGNOSIS — N184 Chronic kidney disease, stage 4 (severe): Secondary | ICD-10-CM | POA: Diagnosis not present

## 2022-04-06 DIAGNOSIS — S92351D Displaced fracture of fifth metatarsal bone, right foot, subsequent encounter for fracture with routine healing: Secondary | ICD-10-CM | POA: Diagnosis not present

## 2022-04-06 DIAGNOSIS — F430 Acute stress reaction: Secondary | ICD-10-CM | POA: Diagnosis not present

## 2022-04-06 DIAGNOSIS — J4520 Mild intermittent asthma, uncomplicated: Secondary | ICD-10-CM | POA: Diagnosis not present

## 2022-04-06 DIAGNOSIS — I130 Hypertensive heart and chronic kidney disease with heart failure and stage 1 through stage 4 chronic kidney disease, or unspecified chronic kidney disease: Secondary | ICD-10-CM | POA: Diagnosis not present

## 2022-04-06 DIAGNOSIS — S92311D Displaced fracture of first metatarsal bone, right foot, subsequent encounter for fracture with routine healing: Secondary | ICD-10-CM | POA: Diagnosis not present

## 2022-04-06 DIAGNOSIS — D631 Anemia in chronic kidney disease: Secondary | ICD-10-CM | POA: Diagnosis not present

## 2022-04-06 DIAGNOSIS — G629 Polyneuropathy, unspecified: Secondary | ICD-10-CM | POA: Diagnosis not present

## 2022-04-06 DIAGNOSIS — I509 Heart failure, unspecified: Secondary | ICD-10-CM | POA: Diagnosis not present

## 2022-04-12 ENCOUNTER — Ambulatory Visit: Payer: Medicare HMO | Attending: Internal Medicine

## 2022-04-12 DIAGNOSIS — Z950 Presence of cardiac pacemaker: Secondary | ICD-10-CM

## 2022-04-12 DIAGNOSIS — I5022 Chronic systolic (congestive) heart failure: Secondary | ICD-10-CM | POA: Diagnosis not present

## 2022-04-14 ENCOUNTER — Other Ambulatory Visit (HOSPITAL_COMMUNITY): Payer: Self-pay | Admitting: Internal Medicine

## 2022-04-14 ENCOUNTER — Other Ambulatory Visit: Payer: Self-pay | Admitting: Physician Assistant

## 2022-04-14 DIAGNOSIS — D631 Anemia in chronic kidney disease: Secondary | ICD-10-CM | POA: Diagnosis not present

## 2022-04-14 DIAGNOSIS — G629 Polyneuropathy, unspecified: Secondary | ICD-10-CM | POA: Diagnosis not present

## 2022-04-14 DIAGNOSIS — N184 Chronic kidney disease, stage 4 (severe): Secondary | ICD-10-CM | POA: Diagnosis not present

## 2022-04-14 DIAGNOSIS — F430 Acute stress reaction: Secondary | ICD-10-CM | POA: Diagnosis not present

## 2022-04-14 DIAGNOSIS — S92311D Displaced fracture of first metatarsal bone, right foot, subsequent encounter for fracture with routine healing: Secondary | ICD-10-CM | POA: Diagnosis not present

## 2022-04-14 DIAGNOSIS — I130 Hypertensive heart and chronic kidney disease with heart failure and stage 1 through stage 4 chronic kidney disease, or unspecified chronic kidney disease: Secondary | ICD-10-CM | POA: Diagnosis not present

## 2022-04-14 DIAGNOSIS — S92351D Displaced fracture of fifth metatarsal bone, right foot, subsequent encounter for fracture with routine healing: Secondary | ICD-10-CM | POA: Diagnosis not present

## 2022-04-14 DIAGNOSIS — J4520 Mild intermittent asthma, uncomplicated: Secondary | ICD-10-CM | POA: Diagnosis not present

## 2022-04-14 DIAGNOSIS — I509 Heart failure, unspecified: Secondary | ICD-10-CM | POA: Diagnosis not present

## 2022-04-16 DIAGNOSIS — G629 Polyneuropathy, unspecified: Secondary | ICD-10-CM | POA: Diagnosis not present

## 2022-04-16 DIAGNOSIS — S92311D Displaced fracture of first metatarsal bone, right foot, subsequent encounter for fracture with routine healing: Secondary | ICD-10-CM | POA: Diagnosis not present

## 2022-04-16 DIAGNOSIS — F430 Acute stress reaction: Secondary | ICD-10-CM | POA: Diagnosis not present

## 2022-04-16 DIAGNOSIS — D631 Anemia in chronic kidney disease: Secondary | ICD-10-CM | POA: Diagnosis not present

## 2022-04-16 DIAGNOSIS — I130 Hypertensive heart and chronic kidney disease with heart failure and stage 1 through stage 4 chronic kidney disease, or unspecified chronic kidney disease: Secondary | ICD-10-CM | POA: Diagnosis not present

## 2022-04-16 DIAGNOSIS — N184 Chronic kidney disease, stage 4 (severe): Secondary | ICD-10-CM | POA: Diagnosis not present

## 2022-04-16 DIAGNOSIS — S92351D Displaced fracture of fifth metatarsal bone, right foot, subsequent encounter for fracture with routine healing: Secondary | ICD-10-CM | POA: Diagnosis not present

## 2022-04-16 DIAGNOSIS — I509 Heart failure, unspecified: Secondary | ICD-10-CM | POA: Diagnosis not present

## 2022-04-16 DIAGNOSIS — J4520 Mild intermittent asthma, uncomplicated: Secondary | ICD-10-CM | POA: Diagnosis not present

## 2022-04-16 NOTE — Progress Notes (Signed)
EPIC Encounter for ICM Monitoring  Patient Name: Alexandra [name] is a 89 y.o. female Date: 04/16/2022 Primary Care Physican: Lawerance Cruel, MD Primary Cardiologist: Skains/Bensimhon Electrophysiologist: Vergie Living Pacing:  98%   02/04/2021 Weight: 162 lbs 04/22/2021 Weight: 168 lbs 11/30/2021 Office Weight: 168 lbs 03/09/2022 Weight: 163 lbs                                                                 Spoke with patient and heart failure questions reviewed.  Transmission results reviewed.  Pt asymptomatic for fluid accumulation.  She is wearing a boot for broken foot.          Corvue Thoracic impedance suggesting normal fluid levels.   Prescribed:  Furosemide 40 mg Take one tablet (40 mg) by mouth once every other day. May take extra as needed.   Potassium 10 mEq 1 tablet daily.   Labs: 10/21/2021 Creatinine 1.46, BUN 47, Potassium 4.7, Sodium 140, GFR 32-34 A complete set of results can be found in Results Review.   Recommendations:  No changes and encouraged to call if experiencing any fluid symptoms.    Follow-up plan: ICM clinic phone appointment on 05/18/2022.  91 day device clinic remote transmission 05/14/2022.     EP/Cardiology Office Visits:  Recall 10/16/2022 with Dr Caryl Comes.   Recall 05/29/2022 with Dr Haroldine Laws   Copy of ICM check sent to Dr. Caryl Comes.     3 month ICM trend: 04/12/2022.    12-14 Month ICM trend:     [name], RN 04/16/2022 2:33 PM

## 2022-04-22 DIAGNOSIS — F430 Acute stress reaction: Secondary | ICD-10-CM | POA: Diagnosis not present

## 2022-04-22 DIAGNOSIS — S92311D Displaced fracture of first metatarsal bone, right foot, subsequent encounter for fracture with routine healing: Secondary | ICD-10-CM | POA: Diagnosis not present

## 2022-04-22 DIAGNOSIS — J4520 Mild intermittent asthma, uncomplicated: Secondary | ICD-10-CM | POA: Diagnosis not present

## 2022-04-22 DIAGNOSIS — I509 Heart failure, unspecified: Secondary | ICD-10-CM | POA: Diagnosis not present

## 2022-04-22 DIAGNOSIS — D631 Anemia in chronic kidney disease: Secondary | ICD-10-CM | POA: Diagnosis not present

## 2022-04-22 DIAGNOSIS — G629 Polyneuropathy, unspecified: Secondary | ICD-10-CM | POA: Diagnosis not present

## 2022-04-22 DIAGNOSIS — N184 Chronic kidney disease, stage 4 (severe): Secondary | ICD-10-CM | POA: Diagnosis not present

## 2022-04-22 DIAGNOSIS — S92351D Displaced fracture of fifth metatarsal bone, right foot, subsequent encounter for fracture with routine healing: Secondary | ICD-10-CM | POA: Diagnosis not present

## 2022-04-22 DIAGNOSIS — I130 Hypertensive heart and chronic kidney disease with heart failure and stage 1 through stage 4 chronic kidney disease, or unspecified chronic kidney disease: Secondary | ICD-10-CM | POA: Diagnosis not present

## 2022-04-23 DIAGNOSIS — N390 Urinary tract infection, site not specified: Secondary | ICD-10-CM | POA: Diagnosis not present

## 2022-04-23 DIAGNOSIS — N1832 Chronic kidney disease, stage 3b: Secondary | ICD-10-CM | POA: Diagnosis not present

## 2022-04-23 DIAGNOSIS — D631 Anemia in chronic kidney disease: Secondary | ICD-10-CM | POA: Diagnosis not present

## 2022-04-23 DIAGNOSIS — E876 Hypokalemia: Secondary | ICD-10-CM | POA: Diagnosis not present

## 2022-04-23 DIAGNOSIS — I129 Hypertensive chronic kidney disease with stage 1 through stage 4 chronic kidney disease, or unspecified chronic kidney disease: Secondary | ICD-10-CM | POA: Diagnosis not present

## 2022-04-23 DIAGNOSIS — N189 Chronic kidney disease, unspecified: Secondary | ICD-10-CM | POA: Diagnosis not present

## 2022-04-24 DIAGNOSIS — G629 Polyneuropathy, unspecified: Secondary | ICD-10-CM | POA: Diagnosis not present

## 2022-04-24 DIAGNOSIS — J4520 Mild intermittent asthma, uncomplicated: Secondary | ICD-10-CM | POA: Diagnosis not present

## 2022-04-24 DIAGNOSIS — S92311D Displaced fracture of first metatarsal bone, right foot, subsequent encounter for fracture with routine healing: Secondary | ICD-10-CM | POA: Diagnosis not present

## 2022-04-24 DIAGNOSIS — I509 Heart failure, unspecified: Secondary | ICD-10-CM | POA: Diagnosis not present

## 2022-04-24 DIAGNOSIS — D631 Anemia in chronic kidney disease: Secondary | ICD-10-CM | POA: Diagnosis not present

## 2022-04-24 DIAGNOSIS — S92351D Displaced fracture of fifth metatarsal bone, right foot, subsequent encounter for fracture with routine healing: Secondary | ICD-10-CM | POA: Diagnosis not present

## 2022-04-24 DIAGNOSIS — N184 Chronic kidney disease, stage 4 (severe): Secondary | ICD-10-CM | POA: Diagnosis not present

## 2022-04-24 DIAGNOSIS — F430 Acute stress reaction: Secondary | ICD-10-CM | POA: Diagnosis not present

## 2022-04-24 DIAGNOSIS — I130 Hypertensive heart and chronic kidney disease with heart failure and stage 1 through stage 4 chronic kidney disease, or unspecified chronic kidney disease: Secondary | ICD-10-CM | POA: Diagnosis not present

## 2022-04-27 DIAGNOSIS — I509 Heart failure, unspecified: Secondary | ICD-10-CM | POA: Diagnosis not present

## 2022-04-27 DIAGNOSIS — D631 Anemia in chronic kidney disease: Secondary | ICD-10-CM | POA: Diagnosis not present

## 2022-04-27 DIAGNOSIS — J4520 Mild intermittent asthma, uncomplicated: Secondary | ICD-10-CM | POA: Diagnosis not present

## 2022-04-27 DIAGNOSIS — N184 Chronic kidney disease, stage 4 (severe): Secondary | ICD-10-CM | POA: Diagnosis not present

## 2022-04-27 DIAGNOSIS — G629 Polyneuropathy, unspecified: Secondary | ICD-10-CM | POA: Diagnosis not present

## 2022-04-27 DIAGNOSIS — S92311D Displaced fracture of first metatarsal bone, right foot, subsequent encounter for fracture with routine healing: Secondary | ICD-10-CM | POA: Diagnosis not present

## 2022-04-27 DIAGNOSIS — F430 Acute stress reaction: Secondary | ICD-10-CM | POA: Diagnosis not present

## 2022-04-27 DIAGNOSIS — I130 Hypertensive heart and chronic kidney disease with heart failure and stage 1 through stage 4 chronic kidney disease, or unspecified chronic kidney disease: Secondary | ICD-10-CM | POA: Diagnosis not present

## 2022-04-27 DIAGNOSIS — S92351D Displaced fracture of fifth metatarsal bone, right foot, subsequent encounter for fracture with routine healing: Secondary | ICD-10-CM | POA: Diagnosis not present

## 2022-04-28 DIAGNOSIS — I509 Heart failure, unspecified: Secondary | ICD-10-CM | POA: Diagnosis not present

## 2022-04-28 DIAGNOSIS — I130 Hypertensive heart and chronic kidney disease with heart failure and stage 1 through stage 4 chronic kidney disease, or unspecified chronic kidney disease: Secondary | ICD-10-CM | POA: Diagnosis not present

## 2022-04-28 DIAGNOSIS — S92351D Displaced fracture of fifth metatarsal bone, right foot, subsequent encounter for fracture with routine healing: Secondary | ICD-10-CM | POA: Diagnosis not present

## 2022-04-28 DIAGNOSIS — S92311D Displaced fracture of first metatarsal bone, right foot, subsequent encounter for fracture with routine healing: Secondary | ICD-10-CM | POA: Diagnosis not present

## 2022-04-28 DIAGNOSIS — F430 Acute stress reaction: Secondary | ICD-10-CM | POA: Diagnosis not present

## 2022-04-28 DIAGNOSIS — D631 Anemia in chronic kidney disease: Secondary | ICD-10-CM | POA: Diagnosis not present

## 2022-04-28 DIAGNOSIS — J4520 Mild intermittent asthma, uncomplicated: Secondary | ICD-10-CM | POA: Diagnosis not present

## 2022-04-28 DIAGNOSIS — G629 Polyneuropathy, unspecified: Secondary | ICD-10-CM | POA: Diagnosis not present

## 2022-04-28 DIAGNOSIS — N184 Chronic kidney disease, stage 4 (severe): Secondary | ICD-10-CM | POA: Diagnosis not present

## 2022-04-29 ENCOUNTER — Ambulatory Visit: Payer: Medicare HMO | Admitting: Physician Assistant

## 2022-04-29 ENCOUNTER — Other Ambulatory Visit (INDEPENDENT_AMBULATORY_CARE_PROVIDER_SITE_OTHER): Payer: Medicare HMO

## 2022-04-29 ENCOUNTER — Encounter: Payer: Self-pay | Admitting: Physician Assistant

## 2022-04-29 DIAGNOSIS — S92351D Displaced fracture of fifth metatarsal bone, right foot, subsequent encounter for fracture with routine healing: Secondary | ICD-10-CM

## 2022-04-29 NOTE — Progress Notes (Signed)
HPI: [name] returns today 9 weeks status post right fifth metatarsal base fracture.  She is overall doing well.  States that her pain has definitely improved.  She is ready to get out of the cam walker boot states is making her back and other joints hurt.  She has had no new injury.  She is taking Tylenol as needed for pain.  Physical exam: General well-developed well-nourished female in no acute distress seated in wheelchair. Right foot dorsal pedal pulse 2+.  Nontender at the base of the fifth metatarsal full dorsiflexion plantarflexion ankle.  Eversion of the right foot against resistance causes no pain and reveals no weakness.    Radiographs:Right foot 3 views: Further consolidation of base of 5th metatarsal fracture.  No change in over all positon and alignment. No acute findings.   Impression: Base 5th metatarsal fracture   Plan: Regular shoe activities as tolerated. Follow up as needed. PT/OT for gait/balance , strengthening and ADL's.  She states that she is having difficulty ambulating and feels that she could benefit from the services ordered these.  Questions were encouraged and answered at length today.

## 2022-05-04 DIAGNOSIS — S92351D Displaced fracture of fifth metatarsal bone, right foot, subsequent encounter for fracture with routine healing: Secondary | ICD-10-CM | POA: Diagnosis not present

## 2022-05-04 DIAGNOSIS — F430 Acute stress reaction: Secondary | ICD-10-CM | POA: Diagnosis not present

## 2022-05-04 DIAGNOSIS — D631 Anemia in chronic kidney disease: Secondary | ICD-10-CM | POA: Diagnosis not present

## 2022-05-04 DIAGNOSIS — G629 Polyneuropathy, unspecified: Secondary | ICD-10-CM | POA: Diagnosis not present

## 2022-05-04 DIAGNOSIS — S92311D Displaced fracture of first metatarsal bone, right foot, subsequent encounter for fracture with routine healing: Secondary | ICD-10-CM | POA: Diagnosis not present

## 2022-05-04 DIAGNOSIS — I130 Hypertensive heart and chronic kidney disease with heart failure and stage 1 through stage 4 chronic kidney disease, or unspecified chronic kidney disease: Secondary | ICD-10-CM | POA: Diagnosis not present

## 2022-05-04 DIAGNOSIS — N184 Chronic kidney disease, stage 4 (severe): Secondary | ICD-10-CM | POA: Diagnosis not present

## 2022-05-04 DIAGNOSIS — I509 Heart failure, unspecified: Secondary | ICD-10-CM | POA: Diagnosis not present

## 2022-05-04 DIAGNOSIS — J4520 Mild intermittent asthma, uncomplicated: Secondary | ICD-10-CM | POA: Diagnosis not present

## 2022-05-05 DIAGNOSIS — S92311D Displaced fracture of first metatarsal bone, right foot, subsequent encounter for fracture with routine healing: Secondary | ICD-10-CM | POA: Diagnosis not present

## 2022-05-05 DIAGNOSIS — S92351D Displaced fracture of fifth metatarsal bone, right foot, subsequent encounter for fracture with routine healing: Secondary | ICD-10-CM | POA: Diagnosis not present

## 2022-05-05 DIAGNOSIS — N184 Chronic kidney disease, stage 4 (severe): Secondary | ICD-10-CM | POA: Diagnosis not present

## 2022-05-05 DIAGNOSIS — F430 Acute stress reaction: Secondary | ICD-10-CM | POA: Diagnosis not present

## 2022-05-05 DIAGNOSIS — I130 Hypertensive heart and chronic kidney disease with heart failure and stage 1 through stage 4 chronic kidney disease, or unspecified chronic kidney disease: Secondary | ICD-10-CM | POA: Diagnosis not present

## 2022-05-05 DIAGNOSIS — J4520 Mild intermittent asthma, uncomplicated: Secondary | ICD-10-CM | POA: Diagnosis not present

## 2022-05-05 DIAGNOSIS — D631 Anemia in chronic kidney disease: Secondary | ICD-10-CM | POA: Diagnosis not present

## 2022-05-05 DIAGNOSIS — G629 Polyneuropathy, unspecified: Secondary | ICD-10-CM | POA: Diagnosis not present

## 2022-05-05 DIAGNOSIS — I509 Heart failure, unspecified: Secondary | ICD-10-CM | POA: Diagnosis not present

## 2022-05-08 DIAGNOSIS — I130 Hypertensive heart and chronic kidney disease with heart failure and stage 1 through stage 4 chronic kidney disease, or unspecified chronic kidney disease: Secondary | ICD-10-CM | POA: Diagnosis not present

## 2022-05-08 DIAGNOSIS — N184 Chronic kidney disease, stage 4 (severe): Secondary | ICD-10-CM | POA: Diagnosis not present

## 2022-05-08 DIAGNOSIS — J4520 Mild intermittent asthma, uncomplicated: Secondary | ICD-10-CM | POA: Diagnosis not present

## 2022-05-08 DIAGNOSIS — D631 Anemia in chronic kidney disease: Secondary | ICD-10-CM | POA: Diagnosis not present

## 2022-05-08 DIAGNOSIS — I509 Heart failure, unspecified: Secondary | ICD-10-CM | POA: Diagnosis not present

## 2022-05-08 DIAGNOSIS — S92311D Displaced fracture of first metatarsal bone, right foot, subsequent encounter for fracture with routine healing: Secondary | ICD-10-CM | POA: Diagnosis not present

## 2022-05-08 DIAGNOSIS — S92351D Displaced fracture of fifth metatarsal bone, right foot, subsequent encounter for fracture with routine healing: Secondary | ICD-10-CM | POA: Diagnosis not present

## 2022-05-08 DIAGNOSIS — G629 Polyneuropathy, unspecified: Secondary | ICD-10-CM | POA: Diagnosis not present

## 2022-05-08 DIAGNOSIS — F430 Acute stress reaction: Secondary | ICD-10-CM | POA: Diagnosis not present

## 2022-05-10 DIAGNOSIS — G629 Polyneuropathy, unspecified: Secondary | ICD-10-CM | POA: Diagnosis not present

## 2022-05-10 DIAGNOSIS — J4520 Mild intermittent asthma, uncomplicated: Secondary | ICD-10-CM | POA: Diagnosis not present

## 2022-05-10 DIAGNOSIS — D631 Anemia in chronic kidney disease: Secondary | ICD-10-CM | POA: Diagnosis not present

## 2022-05-10 DIAGNOSIS — S92351D Displaced fracture of fifth metatarsal bone, right foot, subsequent encounter for fracture with routine healing: Secondary | ICD-10-CM | POA: Diagnosis not present

## 2022-05-10 DIAGNOSIS — F430 Acute stress reaction: Secondary | ICD-10-CM | POA: Diagnosis not present

## 2022-05-10 DIAGNOSIS — N184 Chronic kidney disease, stage 4 (severe): Secondary | ICD-10-CM | POA: Diagnosis not present

## 2022-05-10 DIAGNOSIS — I509 Heart failure, unspecified: Secondary | ICD-10-CM | POA: Diagnosis not present

## 2022-05-10 DIAGNOSIS — S92311D Displaced fracture of first metatarsal bone, right foot, subsequent encounter for fracture with routine healing: Secondary | ICD-10-CM | POA: Diagnosis not present

## 2022-05-10 DIAGNOSIS — I130 Hypertensive heart and chronic kidney disease with heart failure and stage 1 through stage 4 chronic kidney disease, or unspecified chronic kidney disease: Secondary | ICD-10-CM | POA: Diagnosis not present

## 2022-05-12 DIAGNOSIS — F430 Acute stress reaction: Secondary | ICD-10-CM | POA: Diagnosis not present

## 2022-05-12 DIAGNOSIS — S92311D Displaced fracture of first metatarsal bone, right foot, subsequent encounter for fracture with routine healing: Secondary | ICD-10-CM | POA: Diagnosis not present

## 2022-05-12 DIAGNOSIS — D631 Anemia in chronic kidney disease: Secondary | ICD-10-CM | POA: Diagnosis not present

## 2022-05-12 DIAGNOSIS — G629 Polyneuropathy, unspecified: Secondary | ICD-10-CM | POA: Diagnosis not present

## 2022-05-12 DIAGNOSIS — I130 Hypertensive heart and chronic kidney disease with heart failure and stage 1 through stage 4 chronic kidney disease, or unspecified chronic kidney disease: Secondary | ICD-10-CM | POA: Diagnosis not present

## 2022-05-12 DIAGNOSIS — S92351D Displaced fracture of fifth metatarsal bone, right foot, subsequent encounter for fracture with routine healing: Secondary | ICD-10-CM | POA: Diagnosis not present

## 2022-05-12 DIAGNOSIS — I509 Heart failure, unspecified: Secondary | ICD-10-CM | POA: Diagnosis not present

## 2022-05-12 DIAGNOSIS — J4520 Mild intermittent asthma, uncomplicated: Secondary | ICD-10-CM | POA: Diagnosis not present

## 2022-05-12 DIAGNOSIS — N184 Chronic kidney disease, stage 4 (severe): Secondary | ICD-10-CM | POA: Diagnosis not present

## 2022-05-13 DIAGNOSIS — S92311D Displaced fracture of first metatarsal bone, right foot, subsequent encounter for fracture with routine healing: Secondary | ICD-10-CM | POA: Diagnosis not present

## 2022-05-13 DIAGNOSIS — N184 Chronic kidney disease, stage 4 (severe): Secondary | ICD-10-CM | POA: Diagnosis not present

## 2022-05-13 DIAGNOSIS — I509 Heart failure, unspecified: Secondary | ICD-10-CM | POA: Diagnosis not present

## 2022-05-13 DIAGNOSIS — J4520 Mild intermittent asthma, uncomplicated: Secondary | ICD-10-CM | POA: Diagnosis not present

## 2022-05-13 DIAGNOSIS — D631 Anemia in chronic kidney disease: Secondary | ICD-10-CM | POA: Diagnosis not present

## 2022-05-13 DIAGNOSIS — F430 Acute stress reaction: Secondary | ICD-10-CM | POA: Diagnosis not present

## 2022-05-13 DIAGNOSIS — G629 Polyneuropathy, unspecified: Secondary | ICD-10-CM | POA: Diagnosis not present

## 2022-05-13 DIAGNOSIS — I130 Hypertensive heart and chronic kidney disease with heart failure and stage 1 through stage 4 chronic kidney disease, or unspecified chronic kidney disease: Secondary | ICD-10-CM | POA: Diagnosis not present

## 2022-05-13 DIAGNOSIS — S92351D Displaced fracture of fifth metatarsal bone, right foot, subsequent encounter for fracture with routine healing: Secondary | ICD-10-CM | POA: Diagnosis not present

## 2022-05-14 ENCOUNTER — Ambulatory Visit (INDEPENDENT_AMBULATORY_CARE_PROVIDER_SITE_OTHER): Payer: Medicare HMO

## 2022-05-14 DIAGNOSIS — I428 Other cardiomyopathies: Secondary | ICD-10-CM

## 2022-05-14 LAB — CUP PACEART REMOTE DEVICE CHECK
Battery Remaining Longevity: 89 mo
Battery Remaining Percentage: 94 %
Battery Voltage: 3.01 V
Brady Statistic AP VP Percent: 1 %
Brady Statistic AP VS Percent: 1 %
Brady Statistic AS VP Percent: 98 %
Brady Statistic AS VS Percent: 1.6 %
Brady Statistic RA Percent Paced: 1 %
Date Time Interrogation Session: 20240426022318
Implantable Lead Connection Status: 753985
Implantable Lead Connection Status: 753985
Implantable Lead Connection Status: 753985
Implantable Lead Implant Date: 20161229
Implantable Lead Implant Date: 20161229
Implantable Lead Implant Date: 20161229
Implantable Lead Location: 753858
Implantable Lead Location: 753859
Implantable Lead Location: 753860
Implantable Pulse Generator Implant Date: 20230728
Lead Channel Impedance Value: 360 Ohm
Lead Channel Impedance Value: 400 Ohm
Lead Channel Impedance Value: 930 Ohm
Lead Channel Pacing Threshold Amplitude: 0.75 V
Lead Channel Pacing Threshold Amplitude: 0.875 V
Lead Channel Pacing Threshold Amplitude: 0.875 V
Lead Channel Pacing Threshold Pulse Width: 0.5 ms
Lead Channel Pacing Threshold Pulse Width: 0.5 ms
Lead Channel Pacing Threshold Pulse Width: 0.5 ms
Lead Channel Sensing Intrinsic Amplitude: 1.6 mV
Lead Channel Sensing Intrinsic Amplitude: 12 mV
Lead Channel Setting Pacing Amplitude: 1.75 V
Lead Channel Setting Pacing Amplitude: 2 V
Lead Channel Setting Pacing Amplitude: 2 V
Lead Channel Setting Pacing Pulse Width: 0.5 ms
Lead Channel Setting Pacing Pulse Width: 0.5 ms
Lead Channel Setting Sensing Sensitivity: 2 mV
Pulse Gen Model: 3562
Pulse Gen Serial Number: 8101642

## 2022-05-17 DIAGNOSIS — I509 Heart failure, unspecified: Secondary | ICD-10-CM | POA: Diagnosis not present

## 2022-05-17 DIAGNOSIS — I130 Hypertensive heart and chronic kidney disease with heart failure and stage 1 through stage 4 chronic kidney disease, or unspecified chronic kidney disease: Secondary | ICD-10-CM | POA: Diagnosis not present

## 2022-05-17 DIAGNOSIS — S92311D Displaced fracture of first metatarsal bone, right foot, subsequent encounter for fracture with routine healing: Secondary | ICD-10-CM | POA: Diagnosis not present

## 2022-05-17 DIAGNOSIS — F430 Acute stress reaction: Secondary | ICD-10-CM | POA: Diagnosis not present

## 2022-05-17 DIAGNOSIS — G629 Polyneuropathy, unspecified: Secondary | ICD-10-CM | POA: Diagnosis not present

## 2022-05-17 DIAGNOSIS — S92351D Displaced fracture of fifth metatarsal bone, right foot, subsequent encounter for fracture with routine healing: Secondary | ICD-10-CM | POA: Diagnosis not present

## 2022-05-17 DIAGNOSIS — D631 Anemia in chronic kidney disease: Secondary | ICD-10-CM | POA: Diagnosis not present

## 2022-05-17 DIAGNOSIS — N184 Chronic kidney disease, stage 4 (severe): Secondary | ICD-10-CM | POA: Diagnosis not present

## 2022-05-17 DIAGNOSIS — J4520 Mild intermittent asthma, uncomplicated: Secondary | ICD-10-CM | POA: Diagnosis not present

## 2022-05-18 ENCOUNTER — Ambulatory Visit: Payer: Medicare HMO | Attending: Internal Medicine

## 2022-05-18 DIAGNOSIS — I5022 Chronic systolic (congestive) heart failure: Secondary | ICD-10-CM | POA: Diagnosis not present

## 2022-05-18 DIAGNOSIS — Z950 Presence of cardiac pacemaker: Secondary | ICD-10-CM | POA: Diagnosis not present

## 2022-05-19 DIAGNOSIS — N184 Chronic kidney disease, stage 4 (severe): Secondary | ICD-10-CM | POA: Diagnosis not present

## 2022-05-19 DIAGNOSIS — S92311D Displaced fracture of first metatarsal bone, right foot, subsequent encounter for fracture with routine healing: Secondary | ICD-10-CM | POA: Diagnosis not present

## 2022-05-19 DIAGNOSIS — I130 Hypertensive heart and chronic kidney disease with heart failure and stage 1 through stage 4 chronic kidney disease, or unspecified chronic kidney disease: Secondary | ICD-10-CM | POA: Diagnosis not present

## 2022-05-19 DIAGNOSIS — S92351D Displaced fracture of fifth metatarsal bone, right foot, subsequent encounter for fracture with routine healing: Secondary | ICD-10-CM | POA: Diagnosis not present

## 2022-05-19 DIAGNOSIS — I509 Heart failure, unspecified: Secondary | ICD-10-CM | POA: Diagnosis not present

## 2022-05-19 DIAGNOSIS — D631 Anemia in chronic kidney disease: Secondary | ICD-10-CM | POA: Diagnosis not present

## 2022-05-19 DIAGNOSIS — J4520 Mild intermittent asthma, uncomplicated: Secondary | ICD-10-CM | POA: Diagnosis not present

## 2022-05-19 DIAGNOSIS — F430 Acute stress reaction: Secondary | ICD-10-CM | POA: Diagnosis not present

## 2022-05-19 DIAGNOSIS — G629 Polyneuropathy, unspecified: Secondary | ICD-10-CM | POA: Diagnosis not present

## 2022-05-21 ENCOUNTER — Telehealth: Payer: Self-pay

## 2022-05-21 NOTE — Progress Notes (Signed)
EPIC Encounter for ICM Monitoring  Patient Name: Alexandra [name] is a 89 y.o. female Date: 05/21/2022 Primary Care Physican: Daisy Floro, MD Primary Cardiologist: Skains/Bensimhon Electrophysiologist: Joycelyn Schmid Pacing:  98%   02/04/2021 Weight: 162 lbs 04/22/2021 Weight: 168 lbs 11/30/2021 Office Weight: 168 lbs 03/09/2022 Weight: 163 lbs                                                                 Attempted call to patient and unable to reach.   Transmission reviewed.          Corvue Thoracic impedance suggesting possible fluid accumulation 4/16-4/28 and returned to normal 4/29.   Prescribed:  Furosemide 40 mg Take one tablet (40 mg) by mouth once every other day. May take extra as needed.   Potassium 10 mEq 1 tablet daily.   Labs: 10/21/2021 Creatinine 1.46, BUN 47, Potassium 4.7, Sodium 140, GFR 32-34 A complete set of results can be found in Results Review.   Recommendations:  Unable to reach.     Follow-up plan: ICM clinic phone appointment on 06/21/2022.  91 day device clinic remote transmission 08/13/2022.     EP/Cardiology Office Visits:  Recall 10/16/2022 with Dr Graciela Husbands.   Recall 05/29/2022 with Dr Gala Romney   Copy of ICM check sent to Dr. Graciela Husbands.     3 month ICM trend: 05/18/2022.    12-14 Month ICM trend:     Karie Soda, RN 05/21/2022 2:35 PM

## 2022-05-21 NOTE — Telephone Encounter (Signed)
Remote ICM transmission received.  Attempted call to patient regarding ICM remote transmission and no answer.  

## 2022-05-26 DIAGNOSIS — G629 Polyneuropathy, unspecified: Secondary | ICD-10-CM | POA: Diagnosis not present

## 2022-05-26 DIAGNOSIS — J4520 Mild intermittent asthma, uncomplicated: Secondary | ICD-10-CM | POA: Diagnosis not present

## 2022-05-26 DIAGNOSIS — I509 Heart failure, unspecified: Secondary | ICD-10-CM | POA: Diagnosis not present

## 2022-05-26 DIAGNOSIS — S92311D Displaced fracture of first metatarsal bone, right foot, subsequent encounter for fracture with routine healing: Secondary | ICD-10-CM | POA: Diagnosis not present

## 2022-05-26 DIAGNOSIS — N184 Chronic kidney disease, stage 4 (severe): Secondary | ICD-10-CM | POA: Diagnosis not present

## 2022-05-26 DIAGNOSIS — S92351D Displaced fracture of fifth metatarsal bone, right foot, subsequent encounter for fracture with routine healing: Secondary | ICD-10-CM | POA: Diagnosis not present

## 2022-05-26 DIAGNOSIS — D631 Anemia in chronic kidney disease: Secondary | ICD-10-CM | POA: Diagnosis not present

## 2022-05-26 DIAGNOSIS — I130 Hypertensive heart and chronic kidney disease with heart failure and stage 1 through stage 4 chronic kidney disease, or unspecified chronic kidney disease: Secondary | ICD-10-CM | POA: Diagnosis not present

## 2022-05-26 DIAGNOSIS — F430 Acute stress reaction: Secondary | ICD-10-CM | POA: Diagnosis not present

## 2022-05-27 DIAGNOSIS — G629 Polyneuropathy, unspecified: Secondary | ICD-10-CM | POA: Diagnosis not present

## 2022-05-27 DIAGNOSIS — I130 Hypertensive heart and chronic kidney disease with heart failure and stage 1 through stage 4 chronic kidney disease, or unspecified chronic kidney disease: Secondary | ICD-10-CM | POA: Diagnosis not present

## 2022-05-27 DIAGNOSIS — I509 Heart failure, unspecified: Secondary | ICD-10-CM | POA: Diagnosis not present

## 2022-05-27 DIAGNOSIS — J4520 Mild intermittent asthma, uncomplicated: Secondary | ICD-10-CM | POA: Diagnosis not present

## 2022-05-27 DIAGNOSIS — S92311D Displaced fracture of first metatarsal bone, right foot, subsequent encounter for fracture with routine healing: Secondary | ICD-10-CM | POA: Diagnosis not present

## 2022-05-27 DIAGNOSIS — S92351D Displaced fracture of fifth metatarsal bone, right foot, subsequent encounter for fracture with routine healing: Secondary | ICD-10-CM | POA: Diagnosis not present

## 2022-05-27 DIAGNOSIS — N184 Chronic kidney disease, stage 4 (severe): Secondary | ICD-10-CM | POA: Diagnosis not present

## 2022-05-27 DIAGNOSIS — F430 Acute stress reaction: Secondary | ICD-10-CM | POA: Diagnosis not present

## 2022-05-27 DIAGNOSIS — D631 Anemia in chronic kidney disease: Secondary | ICD-10-CM | POA: Diagnosis not present

## 2022-06-01 DIAGNOSIS — I509 Heart failure, unspecified: Secondary | ICD-10-CM | POA: Diagnosis not present

## 2022-06-01 DIAGNOSIS — I130 Hypertensive heart and chronic kidney disease with heart failure and stage 1 through stage 4 chronic kidney disease, or unspecified chronic kidney disease: Secondary | ICD-10-CM | POA: Diagnosis not present

## 2022-06-01 DIAGNOSIS — G629 Polyneuropathy, unspecified: Secondary | ICD-10-CM | POA: Diagnosis not present

## 2022-06-01 DIAGNOSIS — J4520 Mild intermittent asthma, uncomplicated: Secondary | ICD-10-CM | POA: Diagnosis not present

## 2022-06-01 DIAGNOSIS — N184 Chronic kidney disease, stage 4 (severe): Secondary | ICD-10-CM | POA: Diagnosis not present

## 2022-06-01 DIAGNOSIS — S92351D Displaced fracture of fifth metatarsal bone, right foot, subsequent encounter for fracture with routine healing: Secondary | ICD-10-CM | POA: Diagnosis not present

## 2022-06-01 DIAGNOSIS — S92311D Displaced fracture of first metatarsal bone, right foot, subsequent encounter for fracture with routine healing: Secondary | ICD-10-CM | POA: Diagnosis not present

## 2022-06-01 DIAGNOSIS — D631 Anemia in chronic kidney disease: Secondary | ICD-10-CM | POA: Diagnosis not present

## 2022-06-01 DIAGNOSIS — F430 Acute stress reaction: Secondary | ICD-10-CM | POA: Diagnosis not present

## 2022-06-03 DIAGNOSIS — J4520 Mild intermittent asthma, uncomplicated: Secondary | ICD-10-CM | POA: Diagnosis not present

## 2022-06-03 DIAGNOSIS — N184 Chronic kidney disease, stage 4 (severe): Secondary | ICD-10-CM | POA: Diagnosis not present

## 2022-06-03 DIAGNOSIS — S92311D Displaced fracture of first metatarsal bone, right foot, subsequent encounter for fracture with routine healing: Secondary | ICD-10-CM | POA: Diagnosis not present

## 2022-06-03 DIAGNOSIS — D631 Anemia in chronic kidney disease: Secondary | ICD-10-CM | POA: Diagnosis not present

## 2022-06-03 DIAGNOSIS — S92351D Displaced fracture of fifth metatarsal bone, right foot, subsequent encounter for fracture with routine healing: Secondary | ICD-10-CM | POA: Diagnosis not present

## 2022-06-03 DIAGNOSIS — F430 Acute stress reaction: Secondary | ICD-10-CM | POA: Diagnosis not present

## 2022-06-03 DIAGNOSIS — I130 Hypertensive heart and chronic kidney disease with heart failure and stage 1 through stage 4 chronic kidney disease, or unspecified chronic kidney disease: Secondary | ICD-10-CM | POA: Diagnosis not present

## 2022-06-03 DIAGNOSIS — G629 Polyneuropathy, unspecified: Secondary | ICD-10-CM | POA: Diagnosis not present

## 2022-06-03 DIAGNOSIS — I509 Heart failure, unspecified: Secondary | ICD-10-CM | POA: Diagnosis not present

## 2022-06-07 DIAGNOSIS — D631 Anemia in chronic kidney disease: Secondary | ICD-10-CM | POA: Diagnosis not present

## 2022-06-07 DIAGNOSIS — S92351D Displaced fracture of fifth metatarsal bone, right foot, subsequent encounter for fracture with routine healing: Secondary | ICD-10-CM | POA: Diagnosis not present

## 2022-06-07 DIAGNOSIS — N184 Chronic kidney disease, stage 4 (severe): Secondary | ICD-10-CM | POA: Diagnosis not present

## 2022-06-07 DIAGNOSIS — J4520 Mild intermittent asthma, uncomplicated: Secondary | ICD-10-CM | POA: Diagnosis not present

## 2022-06-07 DIAGNOSIS — I130 Hypertensive heart and chronic kidney disease with heart failure and stage 1 through stage 4 chronic kidney disease, or unspecified chronic kidney disease: Secondary | ICD-10-CM | POA: Diagnosis not present

## 2022-06-07 DIAGNOSIS — S92311D Displaced fracture of first metatarsal bone, right foot, subsequent encounter for fracture with routine healing: Secondary | ICD-10-CM | POA: Diagnosis not present

## 2022-06-07 DIAGNOSIS — I509 Heart failure, unspecified: Secondary | ICD-10-CM | POA: Diagnosis not present

## 2022-06-07 DIAGNOSIS — G629 Polyneuropathy, unspecified: Secondary | ICD-10-CM | POA: Diagnosis not present

## 2022-06-07 DIAGNOSIS — F430 Acute stress reaction: Secondary | ICD-10-CM | POA: Diagnosis not present

## 2022-06-08 NOTE — Progress Notes (Signed)
Remote pacemaker transmission.   

## 2022-06-10 DIAGNOSIS — I130 Hypertensive heart and chronic kidney disease with heart failure and stage 1 through stage 4 chronic kidney disease, or unspecified chronic kidney disease: Secondary | ICD-10-CM | POA: Diagnosis not present

## 2022-06-10 DIAGNOSIS — J4520 Mild intermittent asthma, uncomplicated: Secondary | ICD-10-CM | POA: Diagnosis not present

## 2022-06-10 DIAGNOSIS — S92351D Displaced fracture of fifth metatarsal bone, right foot, subsequent encounter for fracture with routine healing: Secondary | ICD-10-CM | POA: Diagnosis not present

## 2022-06-10 DIAGNOSIS — F430 Acute stress reaction: Secondary | ICD-10-CM | POA: Diagnosis not present

## 2022-06-10 DIAGNOSIS — D631 Anemia in chronic kidney disease: Secondary | ICD-10-CM | POA: Diagnosis not present

## 2022-06-10 DIAGNOSIS — G629 Polyneuropathy, unspecified: Secondary | ICD-10-CM | POA: Diagnosis not present

## 2022-06-10 DIAGNOSIS — I509 Heart failure, unspecified: Secondary | ICD-10-CM | POA: Diagnosis not present

## 2022-06-10 DIAGNOSIS — N184 Chronic kidney disease, stage 4 (severe): Secondary | ICD-10-CM | POA: Diagnosis not present

## 2022-06-10 DIAGNOSIS — S92311D Displaced fracture of first metatarsal bone, right foot, subsequent encounter for fracture with routine healing: Secondary | ICD-10-CM | POA: Diagnosis not present

## 2022-06-17 DIAGNOSIS — N184 Chronic kidney disease, stage 4 (severe): Secondary | ICD-10-CM | POA: Diagnosis not present

## 2022-06-17 DIAGNOSIS — I130 Hypertensive heart and chronic kidney disease with heart failure and stage 1 through stage 4 chronic kidney disease, or unspecified chronic kidney disease: Secondary | ICD-10-CM | POA: Diagnosis not present

## 2022-06-17 DIAGNOSIS — I509 Heart failure, unspecified: Secondary | ICD-10-CM | POA: Diagnosis not present

## 2022-06-17 DIAGNOSIS — D631 Anemia in chronic kidney disease: Secondary | ICD-10-CM | POA: Diagnosis not present

## 2022-06-17 DIAGNOSIS — S92351D Displaced fracture of fifth metatarsal bone, right foot, subsequent encounter for fracture with routine healing: Secondary | ICD-10-CM | POA: Diagnosis not present

## 2022-06-17 DIAGNOSIS — F430 Acute stress reaction: Secondary | ICD-10-CM | POA: Diagnosis not present

## 2022-06-17 DIAGNOSIS — J4520 Mild intermittent asthma, uncomplicated: Secondary | ICD-10-CM | POA: Diagnosis not present

## 2022-06-17 DIAGNOSIS — G629 Polyneuropathy, unspecified: Secondary | ICD-10-CM | POA: Diagnosis not present

## 2022-06-17 DIAGNOSIS — S92311D Displaced fracture of first metatarsal bone, right foot, subsequent encounter for fracture with routine healing: Secondary | ICD-10-CM | POA: Diagnosis not present

## 2022-06-21 ENCOUNTER — Ambulatory Visit: Payer: Medicare HMO | Attending: Internal Medicine

## 2022-06-21 DIAGNOSIS — I5022 Chronic systolic (congestive) heart failure: Secondary | ICD-10-CM

## 2022-06-21 NOTE — Progress Notes (Signed)
EPIC Encounter for ICM Monitoring  Patient Name: Alexandra [name] is a 89 y.o. female Date: 06/21/2022 Primary Care Physican: Daisy Floro, MD Primary Cardiologist: Skains/Bensimhon Electrophysiologist: Joycelyn Schmid Pacing:  98%   06/21/2022 Weight: 163 lbs                                                                 Spoke with patient and heart failure questions reviewed.  Transmission results reviewed.  Pt asymptomatic for fluid accumulation.  Reports feeling well at this time and voices no complaints.  She had some birthday celebrations and eating foods higher in salt than normal.           Corvue Thoracic impedance suggesting possible fluid accumulation starting 5/27 and returning close to baseline normal 6/3.   Prescribed:  Furosemide 40 mg Take one tablet (40 mg) by mouth once every other day. May take extra as needed.   Potassium 10 mEq 1 tablet daily.   Labs: 10/21/2021 Creatinine 1.46, BUN 47, Potassium 4.7, Sodium 140, GFR 32-34 A complete set of results can be found in Results Review.   Recommendations:  No changes and encouraged to call if experiencing any fluid symptoms.   Follow-up plan: ICM clinic phone appointment on 07/26/2022.  91 day device clinic remote transmission 08/13/2022.     EP/Cardiology Office Visits:  Recall 10/16/2022 with Dr Graciela Husbands.   Recall 05/29/2022 with Dr Gala Romney   Copy of ICM check sent to Dr. Graciela Husbands.     3 month ICM trend: 06/21/2022.    12-14 Month ICM trend:     Karie Soda, RN 06/21/2022 12:01 PM

## 2022-06-23 DIAGNOSIS — F430 Acute stress reaction: Secondary | ICD-10-CM | POA: Diagnosis not present

## 2022-06-23 DIAGNOSIS — N184 Chronic kidney disease, stage 4 (severe): Secondary | ICD-10-CM | POA: Diagnosis not present

## 2022-06-23 DIAGNOSIS — I509 Heart failure, unspecified: Secondary | ICD-10-CM | POA: Diagnosis not present

## 2022-06-23 DIAGNOSIS — S92311D Displaced fracture of first metatarsal bone, right foot, subsequent encounter for fracture with routine healing: Secondary | ICD-10-CM | POA: Diagnosis not present

## 2022-06-23 DIAGNOSIS — J4520 Mild intermittent asthma, uncomplicated: Secondary | ICD-10-CM | POA: Diagnosis not present

## 2022-06-23 DIAGNOSIS — S92351D Displaced fracture of fifth metatarsal bone, right foot, subsequent encounter for fracture with routine healing: Secondary | ICD-10-CM | POA: Diagnosis not present

## 2022-06-23 DIAGNOSIS — D631 Anemia in chronic kidney disease: Secondary | ICD-10-CM | POA: Diagnosis not present

## 2022-06-23 DIAGNOSIS — I130 Hypertensive heart and chronic kidney disease with heart failure and stage 1 through stage 4 chronic kidney disease, or unspecified chronic kidney disease: Secondary | ICD-10-CM | POA: Diagnosis not present

## 2022-06-23 DIAGNOSIS — G629 Polyneuropathy, unspecified: Secondary | ICD-10-CM | POA: Diagnosis not present

## 2022-06-24 DIAGNOSIS — I9589 Other hypotension: Secondary | ICD-10-CM | POA: Diagnosis not present

## 2022-06-24 DIAGNOSIS — I10 Essential (primary) hypertension: Secondary | ICD-10-CM | POA: Diagnosis not present

## 2022-06-24 DIAGNOSIS — R5383 Other fatigue: Secondary | ICD-10-CM | POA: Diagnosis not present

## 2022-06-30 DIAGNOSIS — S92351D Displaced fracture of fifth metatarsal bone, right foot, subsequent encounter for fracture with routine healing: Secondary | ICD-10-CM | POA: Diagnosis not present

## 2022-06-30 DIAGNOSIS — D631 Anemia in chronic kidney disease: Secondary | ICD-10-CM | POA: Diagnosis not present

## 2022-06-30 DIAGNOSIS — I130 Hypertensive heart and chronic kidney disease with heart failure and stage 1 through stage 4 chronic kidney disease, or unspecified chronic kidney disease: Secondary | ICD-10-CM | POA: Diagnosis not present

## 2022-06-30 DIAGNOSIS — N184 Chronic kidney disease, stage 4 (severe): Secondary | ICD-10-CM | POA: Diagnosis not present

## 2022-06-30 DIAGNOSIS — F430 Acute stress reaction: Secondary | ICD-10-CM | POA: Diagnosis not present

## 2022-06-30 DIAGNOSIS — S92311D Displaced fracture of first metatarsal bone, right foot, subsequent encounter for fracture with routine healing: Secondary | ICD-10-CM | POA: Diagnosis not present

## 2022-06-30 DIAGNOSIS — G629 Polyneuropathy, unspecified: Secondary | ICD-10-CM | POA: Diagnosis not present

## 2022-06-30 DIAGNOSIS — I509 Heart failure, unspecified: Secondary | ICD-10-CM | POA: Diagnosis not present

## 2022-06-30 DIAGNOSIS — J4520 Mild intermittent asthma, uncomplicated: Secondary | ICD-10-CM | POA: Diagnosis not present

## 2022-07-26 ENCOUNTER — Ambulatory Visit: Payer: Medicare HMO | Attending: Internal Medicine

## 2022-07-26 DIAGNOSIS — I5022 Chronic systolic (congestive) heart failure: Secondary | ICD-10-CM

## 2022-07-26 DIAGNOSIS — Z950 Presence of cardiac pacemaker: Secondary | ICD-10-CM

## 2022-07-28 NOTE — Progress Notes (Signed)
EPIC Encounter for ICM Monitoring  Patient Name: Alexandra [name] is a 89 y.o. female Date: 07/28/2022 Primary Care Physican: Daisy Floro, MD Primary Cardiologist: Skains/Bensimhon Electrophysiologist: Joycelyn Schmid Pacing:  99%   02/04/2021 Weight: 162 lbs 04/22/2021 Weight: 168 lbs 11/30/2021 Office Weight: 168 lbs 03/09/2022 Weight: 163 lbs 07/28/2022 Weight: 165 lbs                                                                 Spoke with patient and heart failure questions reviewed.  Transmission results reviewed.  Pt asymptomatic for fluid accumulation.  Her legs have been bothering her and more difficult to get around.         Corvue Thoracic impedance suggesting normal fluid levels with the exception of possible fluid accumulation from 5/27-6/10.   Prescribed:  Furosemide 40 mg Take one tablet (40 mg) by mouth once every other day. May take extra as needed.   Potassium 10 mEq 1 tablet daily.   Labs: 10/21/2021 Creatinine 1.46, BUN 47, Potassium 4.7, Sodium 140, GFR 32-34 A complete set of results can be found in Results Review.   Recommendations:  No changes and encouraged to call if experiencing any fluid symptoms.   Follow-up plan: ICM clinic phone appointment on 08/30/2022.  91 day device clinic remote transmission 08/13/2022.     EP/Cardiology Office Visits:  Recall 10/16/2022 with Dr Graciela Husbands.   Recall 05/29/2022 with Dr Gala Romney   Copy of ICM check sent to Dr. Graciela Husbands.     3 month ICM trend: 07/26/2022.    12-14 Month ICM trend:     Karie Soda, RN 07/28/2022 9:03 AM

## 2022-08-03 DIAGNOSIS — R400 Somnolence: Secondary | ICD-10-CM | POA: Diagnosis not present

## 2022-08-03 DIAGNOSIS — M179 Osteoarthritis of knee, unspecified: Secondary | ICD-10-CM | POA: Diagnosis not present

## 2022-08-03 DIAGNOSIS — W19XXXA Unspecified fall, initial encounter: Secondary | ICD-10-CM | POA: Diagnosis not present

## 2022-08-03 DIAGNOSIS — I10 Essential (primary) hypertension: Secondary | ICD-10-CM | POA: Diagnosis not present

## 2022-08-03 DIAGNOSIS — N1832 Chronic kidney disease, stage 3b: Secondary | ICD-10-CM | POA: Diagnosis not present

## 2022-08-03 DIAGNOSIS — Z6829 Body mass index (BMI) 29.0-29.9, adult: Secondary | ICD-10-CM | POA: Diagnosis not present

## 2022-08-03 DIAGNOSIS — R32 Unspecified urinary incontinence: Secondary | ICD-10-CM | POA: Diagnosis not present

## 2022-08-09 ENCOUNTER — Encounter: Payer: Self-pay | Admitting: Physician Assistant

## 2022-08-09 ENCOUNTER — Ambulatory Visit: Payer: Medicare HMO | Admitting: Physician Assistant

## 2022-08-09 VITALS — BP 117/68

## 2022-08-09 DIAGNOSIS — M5416 Radiculopathy, lumbar region: Secondary | ICD-10-CM | POA: Diagnosis not present

## 2022-08-09 MED ORDER — METHYLPREDNISOLONE 4 MG PO TABS: ORAL_TABLET | ORAL | 0 refills | Status: DC

## 2022-08-09 NOTE — Progress Notes (Addendum)
Office Visit Note   Patient: Alexandra [name]           Date of Birth: [birth year]           MRN: [id number] Visit Date: 08/09/2022              Requested by: Daisy Floro, MD [address] [place],  [place] [address] PCP: Daisy Floro, MD   Assessment & Plan: Visit Diagnoses:  1. Lumbar radiculopathy     Plan: Start physical therapy as planned to begin next week.  Place her on Medrol Dosepak.  CT she fails to improve with therapy and the Medrol Dosepak.  Questions were encouraged and answered  Follow-Up Instructions: Return in about 6 weeks (around 09/20/2022).   Orders:  No orders of the defined types were placed in this encounter.  Meds ordered this encounter  Medications   methylPREDNISolone (MEDROL) 4 MG tablet    Sig: Dose pack  take as directed    Dispense:  21 tablet    Refill:  0      Procedures: No procedures performed   Clinical Data: No additional findings.   Subjective: Chief Complaint  Patient presents with   Right Leg - Weakness   Left Leg - Weakness    HPI [name] is a 89 year old female comes in today planing of bilateral leg pain.  Feels both legs are weak.  She notes that her right leg gives way at times.  She also has some back pain.  She underwent an epidural steroid injection and got good relief from this this was 2022.  At that time she was given a transforaminal injection at L4 on the right.  She states that she is having left radiates from the medial knee down to the ankle.  She does have formal therapy as due to start coming to her house for gait and balance and overall strengthening next week.  She denies any waking pain, fevers, chills, saddle anesthesia, bowel or bladder change acutely loss.  Eliquis.  She takes Tylenol for the pain which helps some.  She notes low back  Review of Systems See HPI  Objective: Vital Signs: BP 117/68   Physical Exam Constitutional:      Appearance: She is not ill-appearing or  diaphoretic.  Cardiovascular:     Pulses: Normal pulses.  Pulmonary:     Effort: Pulmonary effort is normal.  Neurological:     Mental Status: She is alert and oriented to person, place, and time.  Psychiatric:        Mood and Affect: Mood normal.     Ortho Exam Lower extremities 5 out of 5 strengths against resistance except for dorsiflexion of the left ankle against resistance which is 4 out of 5.  Gastrocs bilaterally.  Achilles are nontender Thompson's test secondary to the extremely tight gastrocs patient unable to relax. Bilateral knees: Good range of motion of both knees.  Tenderness medial joint line of both knees no warmth erythema or effusion of either knee.  Specialty Comments:  No specialty comments available.  Imaging: No results found.   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 03/18/2020   Unilateral primary osteoarthritis, right knee 07/04/2017   Dyspnea    Chronic systolic heart failure (HCC)    Pain in left shin 02/07/2015   Leukocytosis  01/31/2015   Chronic systolic CHF (congestive heart failure), NYHA class 3 (HCC) 01/16/2015   Protein-calorie malnutrition, severe (HCC) 01/15/2015   Infection due to Corynebacterium diphtheriae 12/24/2014   Bacteremia    Fever 12/19/2014   Chronic systolic CHF (congestive heart failure), NYHA class 4 (HCC) 10/23/2014   Situational depression    Frequent headaches    Pulmonary embolism (HCC) 10/10/2014   Chronic anticoagulation 10/10/2014   Takotsubo syndrome 08/19/2014   HCAP (healthcare-associated pneumonia) 08/17/2014   H/O Asthma 08/16/2014   LBBB (left bundle branch block) 08/16/2014   Cardiomyopathy- etiology not yet determined 08/16/2014   PSVT (paroxysmal supraventricular tachycardia) 08/16/2014   CKD (chronic kidney disease) stage 3, GFR  30-59 ml/min (HCC) 08/13/2014   Anemia of chronic disease 08/13/2014   Hypothyroidism 08/13/2014   Acute cholecystitis 08/11/2014   Abdominal pain 08/11/2014   Essential (primary) hypertension 04/02/2014   Past Medical History:  Diagnosis Date   Adult hypothyroidism 04/02/2014   Anemia of chronic disease 08/13/2014   Anxiety    Arthritis    Asthma    breast ca dx'd 2000   left   Bursitis    right hip   CAP (community acquired pneumonia) 12/19/2012   Chest pain    NM stress test 05/2011 Normal   CHF (congestive heart failure) (HCC) 08/19/2014   CKD (chronic kidney disease) stage 3, GFR 30-59 ml/min (HCC) 08/13/2014   Depression    DOE (dyspnea on exertion)    No SOB at rest   Essential (primary) hypertension 04/02/2014   Fatigue    excertional   Headache(784.0)    Hereditary and idiopathic peripheral neuropathy 07/02/2014   Hypercholesteremia    Intermittent LBBB (left bundle branch block) 12/21/2012   Leg weakness    Neuropathy    Obesity    Osteopenia    Peripheral neuropathy    Presence of permanent cardiac pacemaker 01/16/2015   BIV   Sacroiliitis, not elsewhere classified (HCC)    Situational depression    Trigger finger    Dr. Kellie Simmering    Family History  Problem Relation Age of Onset   Pneumonia Mother 67       cause of death   Diabetes Brother    Pancreatic cancer Father 40   Alzheimer's disease Sister    CVA Daughter        01/21/09   Stroke Daughter    Hypertension Daughter    Heart attack Neg Hx     Past Surgical History:  Procedure Laterality Date   ACHILLES TENDON SURGERY     BIV PACEMAKER GENERATOR CHANGEOUT N/A 08/14/2021   Procedure: BIV PACEMAKER GENERATOR CHANGEOUT;  Surgeon: Duke Salvia, MD;  Location: [facility] INVASIVE CV LAB;  Service: Cardiovascular;  Laterality: N/A;   BREAST LUMPECTOMY     breast cancer   CARDIAC CATHETERIZATION N/A 07/21/2015   Procedure: Right Heart Cath;  Surgeon: Dolores [name], MD;  Location: [facility] INVASIVE CV LAB;   Service: Cardiovascular;  Laterality: N/A;   CHOLECYSTECTOMY N/A 08/13/2014   Procedure: LAPAROSCOPIC CHOLECYSTECTOMY;  Surgeon: Almond Lint, MD;  Location: [facility] ORS;  Service: General;  Laterality: N/A;   EP IMPLANTABLE DEVICE N/A 01/16/2015   Procedure: BiV Pacemaker Insertion CRT-P;  Surgeon: Duke Salvia, MD;  Location: [facility] INVASIVE CV LAB;  Service: Cardiovascular;  Laterality: N/A;   LEG SURGERY     LUMBAR LAMINECTOMY     POLYPECTOMY     TONSILLECTOMY     VESICOVAGINAL FISTULA CLOSURE W/ TAH  DC hysterectomy   Social History   Occupational History   Occupation: Retired  Tobacco Use   Smoking status: Never   Smokeless tobacco: Never  Vaping Use   Vaping status: Never Used  Substance and Sexual Activity   Alcohol use: No    Alcohol/week: 0.0 standard drinks of alcohol   Drug use: No   Sexual activity: Never

## 2022-08-12 DIAGNOSIS — G629 Polyneuropathy, unspecified: Secondary | ICD-10-CM | POA: Diagnosis not present

## 2022-08-12 DIAGNOSIS — I509 Heart failure, unspecified: Secondary | ICD-10-CM | POA: Diagnosis not present

## 2022-08-12 DIAGNOSIS — M8580 Other specified disorders of bone density and structure, unspecified site: Secondary | ICD-10-CM | POA: Diagnosis not present

## 2022-08-12 DIAGNOSIS — M6530 Trigger finger, unspecified finger: Secondary | ICD-10-CM | POA: Diagnosis not present

## 2022-08-12 DIAGNOSIS — M5430 Sciatica, unspecified side: Secondary | ICD-10-CM | POA: Diagnosis not present

## 2022-08-12 DIAGNOSIS — M1712 Unilateral primary osteoarthritis, left knee: Secondary | ICD-10-CM | POA: Diagnosis not present

## 2022-08-12 DIAGNOSIS — I130 Hypertensive heart and chronic kidney disease with heart failure and stage 1 through stage 4 chronic kidney disease, or unspecified chronic kidney disease: Secondary | ICD-10-CM | POA: Diagnosis not present

## 2022-08-12 DIAGNOSIS — D631 Anemia in chronic kidney disease: Secondary | ICD-10-CM | POA: Diagnosis not present

## 2022-08-12 DIAGNOSIS — N1832 Chronic kidney disease, stage 3b: Secondary | ICD-10-CM | POA: Diagnosis not present

## 2022-08-13 ENCOUNTER — Ambulatory Visit: Payer: Medicare HMO

## 2022-08-13 DIAGNOSIS — M6530 Trigger finger, unspecified finger: Secondary | ICD-10-CM | POA: Diagnosis not present

## 2022-08-13 DIAGNOSIS — I509 Heart failure, unspecified: Secondary | ICD-10-CM | POA: Diagnosis not present

## 2022-08-13 DIAGNOSIS — I428 Other cardiomyopathies: Secondary | ICD-10-CM

## 2022-08-13 DIAGNOSIS — N1832 Chronic kidney disease, stage 3b: Secondary | ICD-10-CM | POA: Diagnosis not present

## 2022-08-13 DIAGNOSIS — M8580 Other specified disorders of bone density and structure, unspecified site: Secondary | ICD-10-CM | POA: Diagnosis not present

## 2022-08-13 DIAGNOSIS — D631 Anemia in chronic kidney disease: Secondary | ICD-10-CM | POA: Diagnosis not present

## 2022-08-13 DIAGNOSIS — M5430 Sciatica, unspecified side: Secondary | ICD-10-CM | POA: Diagnosis not present

## 2022-08-13 DIAGNOSIS — I130 Hypertensive heart and chronic kidney disease with heart failure and stage 1 through stage 4 chronic kidney disease, or unspecified chronic kidney disease: Secondary | ICD-10-CM | POA: Diagnosis not present

## 2022-08-13 DIAGNOSIS — G629 Polyneuropathy, unspecified: Secondary | ICD-10-CM | POA: Diagnosis not present

## 2022-08-13 DIAGNOSIS — M1712 Unilateral primary osteoarthritis, left knee: Secondary | ICD-10-CM | POA: Diagnosis not present

## 2022-08-13 LAB — CUP PACEART REMOTE DEVICE CHECK
Battery Remaining Longevity: 86 mo
Battery Remaining Percentage: 91 %
Battery Voltage: 2.99 V
Brady Statistic AP VP Percent: 1 %
Brady Statistic AP VS Percent: 1 %
Brady Statistic AS VP Percent: 98 %
Brady Statistic AS VS Percent: 1.2 %
Brady Statistic RA Percent Paced: 1 %
Date Time Interrogation Session: 20240726030314
Implantable Lead Connection Status: 753985
Implantable Lead Connection Status: 753985
Implantable Lead Connection Status: 753985
Implantable Lead Implant Date: 20161229
Implantable Lead Implant Date: 20161229
Implantable Lead Implant Date: 20161229
Implantable Lead Location: 753858
Implantable Lead Location: 753859
Implantable Lead Location: 753860
Implantable Pulse Generator Implant Date: 20230728
Lead Channel Impedance Value: 400 Ohm
Lead Channel Impedance Value: 400 Ohm
Lead Channel Impedance Value: 910 Ohm
Lead Channel Pacing Threshold Amplitude: 0.75 V
Lead Channel Pacing Threshold Amplitude: 0.75 V
Lead Channel Pacing Threshold Amplitude: 0.75 V
Lead Channel Pacing Threshold Pulse Width: 0.5 ms
Lead Channel Pacing Threshold Pulse Width: 0.5 ms
Lead Channel Pacing Threshold Pulse Width: 0.5 ms
Lead Channel Sensing Intrinsic Amplitude: 12 mV
Lead Channel Sensing Intrinsic Amplitude: 2.1 mV
Lead Channel Setting Pacing Amplitude: 1.75 V
Lead Channel Setting Pacing Amplitude: 2 V
Lead Channel Setting Pacing Amplitude: 2 V
Lead Channel Setting Pacing Pulse Width: 0.5 ms
Lead Channel Setting Pacing Pulse Width: 0.5 ms
Lead Channel Setting Sensing Sensitivity: 2 mV
Pulse Gen Model: 3562
Pulse Gen Serial Number: 8101642

## 2022-08-16 DIAGNOSIS — I509 Heart failure, unspecified: Secondary | ICD-10-CM | POA: Diagnosis not present

## 2022-08-16 DIAGNOSIS — D631 Anemia in chronic kidney disease: Secondary | ICD-10-CM | POA: Diagnosis not present

## 2022-08-16 DIAGNOSIS — G629 Polyneuropathy, unspecified: Secondary | ICD-10-CM | POA: Diagnosis not present

## 2022-08-16 DIAGNOSIS — M5430 Sciatica, unspecified side: Secondary | ICD-10-CM | POA: Diagnosis not present

## 2022-08-16 DIAGNOSIS — N1832 Chronic kidney disease, stage 3b: Secondary | ICD-10-CM | POA: Diagnosis not present

## 2022-08-16 DIAGNOSIS — M6530 Trigger finger, unspecified finger: Secondary | ICD-10-CM | POA: Diagnosis not present

## 2022-08-16 DIAGNOSIS — I130 Hypertensive heart and chronic kidney disease with heart failure and stage 1 through stage 4 chronic kidney disease, or unspecified chronic kidney disease: Secondary | ICD-10-CM | POA: Diagnosis not present

## 2022-08-16 DIAGNOSIS — M8580 Other specified disorders of bone density and structure, unspecified site: Secondary | ICD-10-CM | POA: Diagnosis not present

## 2022-08-16 DIAGNOSIS — M1712 Unilateral primary osteoarthritis, left knee: Secondary | ICD-10-CM | POA: Diagnosis not present

## 2022-08-20 DIAGNOSIS — I130 Hypertensive heart and chronic kidney disease with heart failure and stage 1 through stage 4 chronic kidney disease, or unspecified chronic kidney disease: Secondary | ICD-10-CM | POA: Diagnosis not present

## 2022-08-20 DIAGNOSIS — N1832 Chronic kidney disease, stage 3b: Secondary | ICD-10-CM | POA: Diagnosis not present

## 2022-08-20 DIAGNOSIS — D631 Anemia in chronic kidney disease: Secondary | ICD-10-CM | POA: Diagnosis not present

## 2022-08-20 DIAGNOSIS — M8580 Other specified disorders of bone density and structure, unspecified site: Secondary | ICD-10-CM | POA: Diagnosis not present

## 2022-08-20 DIAGNOSIS — M6530 Trigger finger, unspecified finger: Secondary | ICD-10-CM | POA: Diagnosis not present

## 2022-08-20 DIAGNOSIS — M5430 Sciatica, unspecified side: Secondary | ICD-10-CM | POA: Diagnosis not present

## 2022-08-20 DIAGNOSIS — M1712 Unilateral primary osteoarthritis, left knee: Secondary | ICD-10-CM | POA: Diagnosis not present

## 2022-08-20 DIAGNOSIS — I509 Heart failure, unspecified: Secondary | ICD-10-CM | POA: Diagnosis not present

## 2022-08-20 DIAGNOSIS — G629 Polyneuropathy, unspecified: Secondary | ICD-10-CM | POA: Diagnosis not present

## 2022-08-20 NOTE — Progress Notes (Signed)
Remote pacemaker transmission.   

## 2022-08-24 DIAGNOSIS — I509 Heart failure, unspecified: Secondary | ICD-10-CM | POA: Diagnosis not present

## 2022-08-24 DIAGNOSIS — D631 Anemia in chronic kidney disease: Secondary | ICD-10-CM | POA: Diagnosis not present

## 2022-08-24 DIAGNOSIS — I130 Hypertensive heart and chronic kidney disease with heart failure and stage 1 through stage 4 chronic kidney disease, or unspecified chronic kidney disease: Secondary | ICD-10-CM | POA: Diagnosis not present

## 2022-08-24 DIAGNOSIS — N1832 Chronic kidney disease, stage 3b: Secondary | ICD-10-CM | POA: Diagnosis not present

## 2022-08-24 DIAGNOSIS — G629 Polyneuropathy, unspecified: Secondary | ICD-10-CM | POA: Diagnosis not present

## 2022-08-24 DIAGNOSIS — M5430 Sciatica, unspecified side: Secondary | ICD-10-CM | POA: Diagnosis not present

## 2022-08-24 DIAGNOSIS — M8580 Other specified disorders of bone density and structure, unspecified site: Secondary | ICD-10-CM | POA: Diagnosis not present

## 2022-08-24 DIAGNOSIS — M1712 Unilateral primary osteoarthritis, left knee: Secondary | ICD-10-CM | POA: Diagnosis not present

## 2022-08-24 DIAGNOSIS — M6530 Trigger finger, unspecified finger: Secondary | ICD-10-CM | POA: Diagnosis not present

## 2022-08-25 DIAGNOSIS — M5430 Sciatica, unspecified side: Secondary | ICD-10-CM | POA: Diagnosis not present

## 2022-08-25 DIAGNOSIS — M1712 Unilateral primary osteoarthritis, left knee: Secondary | ICD-10-CM | POA: Diagnosis not present

## 2022-08-25 DIAGNOSIS — D631 Anemia in chronic kidney disease: Secondary | ICD-10-CM | POA: Diagnosis not present

## 2022-08-25 DIAGNOSIS — G629 Polyneuropathy, unspecified: Secondary | ICD-10-CM | POA: Diagnosis not present

## 2022-08-25 DIAGNOSIS — M6530 Trigger finger, unspecified finger: Secondary | ICD-10-CM | POA: Diagnosis not present

## 2022-08-25 DIAGNOSIS — N1832 Chronic kidney disease, stage 3b: Secondary | ICD-10-CM | POA: Diagnosis not present

## 2022-08-25 DIAGNOSIS — I130 Hypertensive heart and chronic kidney disease with heart failure and stage 1 through stage 4 chronic kidney disease, or unspecified chronic kidney disease: Secondary | ICD-10-CM | POA: Diagnosis not present

## 2022-08-25 DIAGNOSIS — I509 Heart failure, unspecified: Secondary | ICD-10-CM | POA: Diagnosis not present

## 2022-08-25 DIAGNOSIS — M8580 Other specified disorders of bone density and structure, unspecified site: Secondary | ICD-10-CM | POA: Diagnosis not present

## 2022-08-27 DIAGNOSIS — M6530 Trigger finger, unspecified finger: Secondary | ICD-10-CM | POA: Diagnosis not present

## 2022-08-27 DIAGNOSIS — I130 Hypertensive heart and chronic kidney disease with heart failure and stage 1 through stage 4 chronic kidney disease, or unspecified chronic kidney disease: Secondary | ICD-10-CM | POA: Diagnosis not present

## 2022-08-27 DIAGNOSIS — G629 Polyneuropathy, unspecified: Secondary | ICD-10-CM | POA: Diagnosis not present

## 2022-08-27 DIAGNOSIS — M5430 Sciatica, unspecified side: Secondary | ICD-10-CM | POA: Diagnosis not present

## 2022-08-27 DIAGNOSIS — I509 Heart failure, unspecified: Secondary | ICD-10-CM | POA: Diagnosis not present

## 2022-08-27 DIAGNOSIS — D631 Anemia in chronic kidney disease: Secondary | ICD-10-CM | POA: Diagnosis not present

## 2022-08-27 DIAGNOSIS — M8580 Other specified disorders of bone density and structure, unspecified site: Secondary | ICD-10-CM | POA: Diagnosis not present

## 2022-08-27 DIAGNOSIS — N1832 Chronic kidney disease, stage 3b: Secondary | ICD-10-CM | POA: Diagnosis not present

## 2022-08-27 DIAGNOSIS — M1712 Unilateral primary osteoarthritis, left knee: Secondary | ICD-10-CM | POA: Diagnosis not present

## 2022-08-30 ENCOUNTER — Ambulatory Visit: Payer: Medicare HMO

## 2022-08-30 DIAGNOSIS — I5022 Chronic systolic (congestive) heart failure: Secondary | ICD-10-CM | POA: Diagnosis not present

## 2022-08-30 DIAGNOSIS — Z950 Presence of cardiac pacemaker: Secondary | ICD-10-CM

## 2022-08-31 DIAGNOSIS — M6530 Trigger finger, unspecified finger: Secondary | ICD-10-CM | POA: Diagnosis not present

## 2022-08-31 DIAGNOSIS — D631 Anemia in chronic kidney disease: Secondary | ICD-10-CM | POA: Diagnosis not present

## 2022-08-31 DIAGNOSIS — N1832 Chronic kidney disease, stage 3b: Secondary | ICD-10-CM | POA: Diagnosis not present

## 2022-08-31 DIAGNOSIS — M8580 Other specified disorders of bone density and structure, unspecified site: Secondary | ICD-10-CM | POA: Diagnosis not present

## 2022-08-31 DIAGNOSIS — I509 Heart failure, unspecified: Secondary | ICD-10-CM | POA: Diagnosis not present

## 2022-08-31 DIAGNOSIS — M1712 Unilateral primary osteoarthritis, left knee: Secondary | ICD-10-CM | POA: Diagnosis not present

## 2022-08-31 DIAGNOSIS — I130 Hypertensive heart and chronic kidney disease with heart failure and stage 1 through stage 4 chronic kidney disease, or unspecified chronic kidney disease: Secondary | ICD-10-CM | POA: Diagnosis not present

## 2022-08-31 DIAGNOSIS — G629 Polyneuropathy, unspecified: Secondary | ICD-10-CM | POA: Diagnosis not present

## 2022-08-31 DIAGNOSIS — M5430 Sciatica, unspecified side: Secondary | ICD-10-CM | POA: Diagnosis not present

## 2022-09-01 DIAGNOSIS — I130 Hypertensive heart and chronic kidney disease with heart failure and stage 1 through stage 4 chronic kidney disease, or unspecified chronic kidney disease: Secondary | ICD-10-CM | POA: Diagnosis not present

## 2022-09-01 DIAGNOSIS — M1712 Unilateral primary osteoarthritis, left knee: Secondary | ICD-10-CM | POA: Diagnosis not present

## 2022-09-01 DIAGNOSIS — G629 Polyneuropathy, unspecified: Secondary | ICD-10-CM | POA: Diagnosis not present

## 2022-09-01 DIAGNOSIS — M6530 Trigger finger, unspecified finger: Secondary | ICD-10-CM | POA: Diagnosis not present

## 2022-09-01 DIAGNOSIS — M5430 Sciatica, unspecified side: Secondary | ICD-10-CM | POA: Diagnosis not present

## 2022-09-01 DIAGNOSIS — N1832 Chronic kidney disease, stage 3b: Secondary | ICD-10-CM | POA: Diagnosis not present

## 2022-09-01 DIAGNOSIS — D631 Anemia in chronic kidney disease: Secondary | ICD-10-CM | POA: Diagnosis not present

## 2022-09-01 DIAGNOSIS — M8580 Other specified disorders of bone density and structure, unspecified site: Secondary | ICD-10-CM | POA: Diagnosis not present

## 2022-09-01 DIAGNOSIS — I509 Heart failure, unspecified: Secondary | ICD-10-CM | POA: Diagnosis not present

## 2022-09-02 DIAGNOSIS — M1712 Unilateral primary osteoarthritis, left knee: Secondary | ICD-10-CM | POA: Diagnosis not present

## 2022-09-02 DIAGNOSIS — D631 Anemia in chronic kidney disease: Secondary | ICD-10-CM | POA: Diagnosis not present

## 2022-09-02 DIAGNOSIS — M6530 Trigger finger, unspecified finger: Secondary | ICD-10-CM | POA: Diagnosis not present

## 2022-09-02 DIAGNOSIS — N1832 Chronic kidney disease, stage 3b: Secondary | ICD-10-CM | POA: Diagnosis not present

## 2022-09-02 DIAGNOSIS — G629 Polyneuropathy, unspecified: Secondary | ICD-10-CM | POA: Diagnosis not present

## 2022-09-02 DIAGNOSIS — I509 Heart failure, unspecified: Secondary | ICD-10-CM | POA: Diagnosis not present

## 2022-09-02 DIAGNOSIS — I130 Hypertensive heart and chronic kidney disease with heart failure and stage 1 through stage 4 chronic kidney disease, or unspecified chronic kidney disease: Secondary | ICD-10-CM | POA: Diagnosis not present

## 2022-09-02 DIAGNOSIS — M8580 Other specified disorders of bone density and structure, unspecified site: Secondary | ICD-10-CM | POA: Diagnosis not present

## 2022-09-02 DIAGNOSIS — M5430 Sciatica, unspecified side: Secondary | ICD-10-CM | POA: Diagnosis not present

## 2022-09-03 NOTE — Progress Notes (Signed)
EPIC Encounter for ICM Monitoring  Patient Name: Alexandra [name] is a 89 y.o. female Date: 09/03/2022 Primary Care Physican: Daisy Floro, MD Primary Cardiologist: Skains/Bensimhon Electrophysiologist: Joycelyn Schmid Pacing:  99%   02/04/2021 Weight: 162 lbs 04/22/2021 Weight: 168 lbs 11/30/2021 Office Weight: 168 lbs 03/09/2022 Weight: 163 lbs 07/28/2022 Weight: 165 lbs 09/03/2022 Weight: 172 lbs                                                                 Spoke with patient and heart failure questions reviewed.  Transmission results reviewed.  Pt asymptomatic for fluid accumulation.          Corvue Thoracic impedance suggesting normal fluid levels within the last month.   Prescribed:  Furosemide 40 mg Take one tablet (40 mg) by mouth once every other day. May take extra as needed.   Potassium 10 mEq 1 tablet daily.   Labs: 10/21/2021 Creatinine 1.46, BUN 47, Potassium 4.7, Sodium 140, GFR 32-34 A complete set of results can be found in Results Review.   Recommendations:  No changes and encouraged to call if experiencing any fluid symptoms.   Follow-up plan: ICM clinic phone appointment on 10/04/2022.  91 day device clinic remote transmission 11/12/2022.     EP/Cardiology Office Visits:  11/16/2022 with Dr Graciela Husbands.   Recall 05/29/2022 with Dr Gala Romney.   Copy of ICM check sent to Dr. Graciela Husbands.     3 month ICM trend: 08/30/2022.    12-14 Month ICM trend:     Karie Soda, RN 09/03/2022 2:41 PM

## 2022-09-06 ENCOUNTER — Ambulatory Visit: Payer: Medicare HMO | Admitting: Physician Assistant

## 2022-09-06 DIAGNOSIS — R051 Acute cough: Secondary | ICD-10-CM | POA: Diagnosis not present

## 2022-09-06 DIAGNOSIS — R52 Pain, unspecified: Secondary | ICD-10-CM | POA: Diagnosis not present

## 2022-09-06 DIAGNOSIS — U071 COVID-19: Secondary | ICD-10-CM | POA: Diagnosis not present

## 2022-09-06 DIAGNOSIS — R5383 Other fatigue: Secondary | ICD-10-CM | POA: Diagnosis not present

## 2022-09-06 DIAGNOSIS — R509 Fever, unspecified: Secondary | ICD-10-CM | POA: Diagnosis not present

## 2022-09-15 DIAGNOSIS — M6530 Trigger finger, unspecified finger: Secondary | ICD-10-CM | POA: Diagnosis not present

## 2022-09-15 DIAGNOSIS — M5430 Sciatica, unspecified side: Secondary | ICD-10-CM | POA: Diagnosis not present

## 2022-09-15 DIAGNOSIS — N1832 Chronic kidney disease, stage 3b: Secondary | ICD-10-CM | POA: Diagnosis not present

## 2022-09-15 DIAGNOSIS — M8580 Other specified disorders of bone density and structure, unspecified site: Secondary | ICD-10-CM | POA: Diagnosis not present

## 2022-09-15 DIAGNOSIS — G629 Polyneuropathy, unspecified: Secondary | ICD-10-CM | POA: Diagnosis not present

## 2022-09-15 DIAGNOSIS — I130 Hypertensive heart and chronic kidney disease with heart failure and stage 1 through stage 4 chronic kidney disease, or unspecified chronic kidney disease: Secondary | ICD-10-CM | POA: Diagnosis not present

## 2022-09-15 DIAGNOSIS — D631 Anemia in chronic kidney disease: Secondary | ICD-10-CM | POA: Diagnosis not present

## 2022-09-15 DIAGNOSIS — M1712 Unilateral primary osteoarthritis, left knee: Secondary | ICD-10-CM | POA: Diagnosis not present

## 2022-09-15 DIAGNOSIS — I509 Heart failure, unspecified: Secondary | ICD-10-CM | POA: Diagnosis not present

## 2022-09-22 ENCOUNTER — Telehealth: Payer: Self-pay | Admitting: Internal Medicine

## 2022-09-22 NOTE — Telephone Encounter (Signed)
Pt c/o medication issue:  1. Name of Medication: Beta Blocker   2. How are you currently taking this medication (dosage and times per day)?   3. Are you having a reaction (difficulty breathing--STAT)?   4. What is your medication issue?    Marcelino Duster from [facility] is calling to see if patient can be put on a beta blocker due to diagnosis they have received for patient.

## 2022-09-22 NOTE — Telephone Encounter (Signed)
Forwarded to Medical Records as RN does not have permission to speak with [facility] Rep.

## 2022-10-04 ENCOUNTER — Ambulatory Visit: Payer: Medicare HMO | Attending: Internal Medicine

## 2022-10-04 DIAGNOSIS — I5022 Chronic systolic (congestive) heart failure: Secondary | ICD-10-CM | POA: Diagnosis not present

## 2022-10-04 DIAGNOSIS — Z950 Presence of cardiac pacemaker: Secondary | ICD-10-CM | POA: Diagnosis not present

## 2022-10-07 ENCOUNTER — Ambulatory Visit: Payer: Medicare HMO | Admitting: Physician Assistant

## 2022-10-07 ENCOUNTER — Telehealth: Payer: Self-pay

## 2022-10-07 DIAGNOSIS — M7062 Trochanteric bursitis, left hip: Secondary | ICD-10-CM

## 2022-10-07 DIAGNOSIS — M5416 Radiculopathy, lumbar region: Secondary | ICD-10-CM

## 2022-10-07 MED ORDER — TRAMADOL HCL 50 MG PO TABS: 50.0000 mg | ORAL_TABLET | Freq: Four times a day (QID) | ORAL | 0 refills | Status: DC | PRN

## 2022-10-07 MED ORDER — METHYLPREDNISOLONE ACETATE 40 MG/ML IJ SUSP
40.0000 mg | INTRAMUSCULAR | Status: AC | PRN
Start: 2022-10-07 — End: 2022-10-07
  Administered 2022-10-07: 40 mg via INTRA_ARTICULAR

## 2022-10-07 MED ORDER — LIDOCAINE HCL 1 % IJ SOLN
3.0000 mL | INTRAMUSCULAR | Status: AC | PRN
Start: 2022-10-07 — End: 2022-10-07
  Administered 2022-10-07: 3 mL

## 2022-10-07 NOTE — Progress Notes (Signed)
EPIC Encounter for ICM Monitoring  Patient Name: Alexandra [name] is a 89 y.o. female Date: 10/07/2022 Primary Care Physican: Daisy Floro, MD Primary Cardiologist: Skains/Bensimhon Electrophysiologist: Joycelyn Schmid Pacing:  99%   02/04/2021 Weight: 162 lbs 04/22/2021 Weight: 168 lbs 11/30/2021 Office Weight: 168 lbs 03/09/2022 Weight: 163 lbs 07/28/2022 Weight: 165 lbs 09/03/2022 Weight: 172 lbs                                                                 Attempted call to patient and unable to reach.  Left detailed message per DPR regarding transmission. Transmission reviewed.          Corvue Thoracic impedance suggesting normal fluid levels with the exception of possible fluid accumulation from 8/30-9/9 within the last month.    Prescribed:  Furosemide 40 mg Take one tablet (40 mg) by mouth once every other day. May take extra as needed.   Potassium 10 mEq 1 tablet daily.   Labs: 10/21/2021 Creatinine 1.46, BUN 47, Potassium 4.7, Sodium 140, GFR 32-34 A complete set of results can be found in Results Review.   Recommendations:  Left voice mail with ICM number and encouraged to call if experiencing any fluid symptoms.   Follow-up plan: ICM clinic phone appointment on 11/08/2022.  91 day device clinic remote transmission 11/12/2022.     EP/Cardiology Office Visits:  11/16/2022 with Dr Graciela Husbands.   Recall 05/29/2022 with Dr Gala Romney.   Copy of ICM check sent to Dr. Graciela Husbands.     3 month ICM trend: 10/04/2022.    12-14 Month ICM trend:     Karie Soda, RN 10/07/2022 1:01 PM

## 2022-10-07 NOTE — Telephone Encounter (Signed)
Remote ICM transmission received.  Attempted call to patient regarding ICM remote transmission and left detailed message per DPR.  Left ICM phone number and advised to return call for any fluid symptoms or questions. Next ICM remote transmission scheduled 11/08/2022.

## 2022-10-07 NOTE — Progress Notes (Signed)
HPI: Alexandra [name] comes in today for follow-up of her low back pain.  We saw her back in July at that time gave her the Dosepak she had therapy scheduled for her back also.  Soon after she finished the Dosepak and started therapy she came down with COVID.  Now her back pain is worse than it was.  She is having pain down both legs.  The left leg she is now having pain from the left hip down into the lateral aspect of the foot with some tingling at times.  Right leg pain from the hip down to the mid calf region.  She states she is having waking pain due to the back.  No bowel or bladder dysfunction no saddle anesthesia symptoms negative for fevers chills.  She is on chronic Eliquis.  She is unable to have an MRI due to ICD implant.  Review of systems: See HPI otherwise negative  Physical exam: General Well-developed well-nourished female no acute distress.  Lower extremities: 5 out of 5 strength throughout the lower extremities except for dorsiflexion of the left foot against resistance which is 4 out of 5.  Positive straight leg raise bilaterally.  Tenderness over the left hip trochanteric region.  Impression: Bilateral lumbar radiculopathy Left trochanteric bursitis  Plan: Given patient's continued pain that is now unchanged from prior symptoms she was having recommend CT myelogram.  Again patient unable to undergo MRI due to pacemaker.  He is given tramadol for pain.  We also injected her hip today to hopefully help with the pain she is having lateral aspect of the hip on the left.  She will follow-up with Dr. Magnus Ivan after the CT myelogram to go over the results and discuss further treatment.  Questions were encouraged and answered.      Procedure Note  Patient: Alexandra [name]             Date of Birth: 1927-03-08           MRN: [id number]             Visit Date: 10/07/2022  Procedures: Visit Diagnoses: No diagnosis found.  Large Joint Inj: L greater trochanter on 10/07/2022 2:27  PM Indications: pain Details: 22 G 1.5 in needle, lateral approach  Arthrogram: No  Medications: 3 mL lidocaine 1 %; 40 mg methylPREDNISolone acetate 40 MG/ML Outcome: tolerated well, no immediate complications Procedure, treatment alternatives, risks and benefits explained, specific risks discussed. Consent was given by the patient. Immediately prior to procedure a time out was called to verify the correct patient, procedure, equipment, support staff and site/side marked as required. Patient was prepped and draped in the usual sterile fashion.

## 2022-10-07 NOTE — Addendum Note (Signed)
Addended by: Mardene Celeste B on: 10/07/2022 03:03 PM   Modules accepted: Orders

## 2022-10-11 ENCOUNTER — Other Ambulatory Visit: Payer: Self-pay | Admitting: Physician Assistant

## 2022-10-11 ENCOUNTER — Other Ambulatory Visit: Payer: Self-pay

## 2022-10-11 MED ORDER — ISOSORBIDE MONONITRATE ER 30 MG PO TB24: 30.0000 mg | ORAL_TABLET | Freq: Every day | ORAL | 1 refills | Status: DC

## 2022-10-12 ENCOUNTER — Telehealth: Payer: Self-pay | Admitting: Physician Assistant

## 2022-10-12 NOTE — Telephone Encounter (Signed)
Patient called and said that the tramadol that she take is making her very sleepy. CB#[phone]

## 2022-10-14 ENCOUNTER — Telehealth: Payer: Self-pay

## 2022-10-14 ENCOUNTER — Other Ambulatory Visit: Payer: Self-pay

## 2022-10-14 DIAGNOSIS — M5416 Radiculopathy, lumbar region: Secondary | ICD-10-CM

## 2022-10-14 NOTE — Telephone Encounter (Signed)
Patient called again concerning her message that she left on Tuesday.  Would like a CB at [phone].  Please advise.  Thank you.

## 2022-10-14 NOTE — Telephone Encounter (Signed)
Patient aware, ordering CT Myelo

## 2022-10-15 ENCOUNTER — Other Ambulatory Visit: Payer: Self-pay | Admitting: Physician Assistant

## 2022-10-15 MED ORDER — ACETAMINOPHEN-CODEINE 300-30 MG PO TABS
1.0000 | ORAL_TABLET | Freq: Four times a day (QID) | ORAL | 0 refills | Status: DC | PRN
Start: 2022-10-15 — End: 2022-12-20

## 2022-10-18 ENCOUNTER — Encounter: Payer: Self-pay | Admitting: Orthopedic Surgery

## 2022-10-22 NOTE — Discharge Instructions (Signed)

## 2022-10-25 ENCOUNTER — Ambulatory Visit
Admission: RE | Admit: 2022-10-25 | Discharge: 2022-10-25 | Disposition: A | Discharge status: Home / Self Care | Payer: Medicare HMO | Source: Ambulatory Visit | Attending: Physician Assistant | Admitting: Physician Assistant

## 2022-10-25 DIAGNOSIS — M5416 Radiculopathy, lumbar region: Secondary | ICD-10-CM

## 2022-10-25 DIAGNOSIS — I700 Atherosclerosis of aorta: Secondary | ICD-10-CM | POA: Diagnosis not present

## 2022-10-25 DIAGNOSIS — M439 Deforming dorsopathy, unspecified: Secondary | ICD-10-CM | POA: Diagnosis not present

## 2022-10-25 DIAGNOSIS — M47817 Spondylosis without myelopathy or radiculopathy, lumbosacral region: Secondary | ICD-10-CM | POA: Diagnosis not present

## 2022-10-25 DIAGNOSIS — M47816 Spondylosis without myelopathy or radiculopathy, lumbar region: Secondary | ICD-10-CM | POA: Diagnosis not present

## 2022-10-25 MED ORDER — MEPERIDINE HCL 50 MG/ML IJ SOLN: 50.0000 mg | Freq: Once | INTRAMUSCULAR | Status: DC | PRN

## 2022-10-25 MED ORDER — ONDANSETRON HCL 4 MG/2ML IJ SOLN: 4.0000 mg | Freq: Once | INTRAMUSCULAR | Status: DC | PRN

## 2022-10-25 MED ORDER — IOPAMIDOL (ISOVUE-M 200) INJECTION 41%
20.0000 mL | Freq: Once | INTRAMUSCULAR | Status: AC
  Administered 2022-10-25: 20 mL via INTRATHECAL

## 2022-10-25 MED ORDER — DIAZEPAM 5 MG PO TABS
5.0000 mg | ORAL_TABLET | Freq: Once | ORAL | Status: AC
  Administered 2022-10-25: 5 mg via ORAL

## 2022-10-26 ENCOUNTER — Telehealth: Payer: Self-pay

## 2022-10-26 NOTE — Telephone Encounter (Signed)
Patient called wanting to schedule a back injection with Dr. Alvester Morin. CB # [phone]

## 2022-10-27 NOTE — Telephone Encounter (Signed)
Has Dr. Magnus Ivan received the results of the lumbar CT? Not sure if he was thinking of ordering an injection

## 2022-10-28 ENCOUNTER — Telehealth: Payer: Self-pay | Admitting: Orthopaedic Surgery

## 2022-10-28 ENCOUNTER — Other Ambulatory Visit: Payer: Self-pay

## 2022-10-28 DIAGNOSIS — M5416 Radiculopathy, lumbar region: Secondary | ICD-10-CM

## 2022-10-28 NOTE — Telephone Encounter (Signed)
Patient daughter called and said that she is very much in pain and she would like to know the results for her. She has an appt already for the 28th but she is old and need to know what to do. CB#[phone]

## 2022-10-28 NOTE — Telephone Encounter (Signed)
Order placed

## 2022-10-29 NOTE — Telephone Encounter (Signed)
Spoke with patient and scheduled injection for 11/01/22. Patient aware driver needed

## 2022-11-01 ENCOUNTER — Ambulatory Visit: Payer: Medicare HMO | Admitting: Physical Medicine and Rehabilitation

## 2022-11-01 ENCOUNTER — Other Ambulatory Visit: Payer: Self-pay

## 2022-11-01 DIAGNOSIS — M5416 Radiculopathy, lumbar region: Secondary | ICD-10-CM | POA: Diagnosis not present

## 2022-11-01 MED ORDER — METHYLPREDNISOLONE ACETATE 40 MG/ML IJ SUSP
40.0000 mg | Freq: Once | INTRAMUSCULAR | Status: AC
Start: 2022-11-01 — End: 2022-11-01
  Administered 2022-11-01: 40 mg

## 2022-11-01 NOTE — Progress Notes (Signed)
Functional Pain Scale - descriptive words and definitions  Unmanageable (7)  Pain interferes with normal ADL's/nothing seems to help/sleep is very difficult/active distractions are very difficult to concentrate on. Severe range order  Average Pain 7

## 2022-11-01 NOTE — Patient Instructions (Signed)

## 2022-11-08 ENCOUNTER — Ambulatory Visit: Payer: Medicare HMO | Attending: Internal Medicine

## 2022-11-08 DIAGNOSIS — Z950 Presence of cardiac pacemaker: Secondary | ICD-10-CM

## 2022-11-08 DIAGNOSIS — I5022 Chronic systolic (congestive) heart failure: Secondary | ICD-10-CM

## 2022-11-11 NOTE — Progress Notes (Signed)
EPIC Encounter for ICM Monitoring  Patient Name: Alexandra [name] is a 89 y.o. female Date: 11/11/2022 Primary Care Physican: Daisy Floro, MD Primary Cardiologist: Skains/Bensimhon Electrophysiologist: Joycelyn Schmid Pacing:  99%   02/04/2021 Weight: 162 lbs 04/22/2021 Weight: 168 lbs 11/30/2021 Office Weight: 168 lbs 03/09/2022 Weight: 163 lbs 07/28/2022 Weight: 165 lbs 09/03/2022 Weight: 172 lbs                                                                 Spoke with patient and heart failure questions reviewed.  Transmission results reviewed.  Pt asymptomatic for fluid accumulation.  Reports feeling well at this time and voices no complaints.    Still having back pain.           Corvue Thoracic impedance suggesting normal fluid levels within the last month.    Prescribed:  Furosemide 40 mg Take one tablet (40 mg) by mouth once every other day. May take extra as needed.   Potassium 10 mEq 1 tablet daily.   Labs: 10/21/2021 Creatinine 1.46, BUN 47, Potassium 4.7, Sodium 140, GFR 32-34 A complete set of results can be found in Results Review.   Recommendations:  No changes and encouraged to call if experiencing any fluid symptoms.   Follow-up plan: ICM clinic phone appointment on 12/20/2022.  91 day device clinic remote transmission 02/11/2023.     EP/Cardiology Office Visits:  11/16/2022 with Dr Graciela Husbands.   Recall 05/29/2022 with Dr Gala Romney.   Copy of ICM check sent to Dr. Graciela Husbands.   3 month ICM trend: 11/08/2022.    12-14 Month ICM trend:     Karie Soda, RN 11/11/2022 3:58 PM

## 2022-11-15 ENCOUNTER — Ambulatory Visit: Payer: Medicare HMO | Admitting: Orthopaedic Surgery

## 2022-11-15 ENCOUNTER — Telehealth: Payer: Self-pay | Admitting: Physical Medicine and Rehabilitation

## 2022-11-15 NOTE — Telephone Encounter (Signed)
Patient's daughter called. Says mom is still having a lot of pain. Her cb [phone]

## 2022-11-16 ENCOUNTER — Encounter: Payer: Self-pay | Admitting: Internal Medicine

## 2022-11-16 ENCOUNTER — Telehealth: Payer: Self-pay | Admitting: Physical Medicine and Rehabilitation

## 2022-11-16 ENCOUNTER — Ambulatory Visit: Payer: Medicare HMO | Attending: Internal Medicine | Admitting: Internal Medicine

## 2022-11-16 VITALS — BP 134/66 | HR 95 | Ht 64.0 in | Wt 165.2 lb

## 2022-11-16 DIAGNOSIS — I428 Other cardiomyopathies: Secondary | ICD-10-CM

## 2022-11-16 DIAGNOSIS — Z95810 Presence of automatic (implantable) cardiac defibrillator: Secondary | ICD-10-CM | POA: Diagnosis not present

## 2022-11-16 DIAGNOSIS — Z79899 Other long term (current) drug therapy: Secondary | ICD-10-CM | POA: Diagnosis not present

## 2022-11-16 DIAGNOSIS — I5022 Chronic systolic (congestive) heart failure: Secondary | ICD-10-CM

## 2022-11-16 NOTE — Procedures (Signed)
Lumbosacral Transforaminal Epidural Steroid Injection - Sub-Pedicular Approach with Fluoroscopic Guidance  Patient: Alexandra [name]      Date of Birth: 1927/12/05 MRN: [id number] PCP: Daisy Floro, MD      Visit Date: 11/01/2022   Universal Protocol:    Date/Time: 11/01/2022  Consent Given By: the patient  Position: PRONE  Additional Comments: Vital signs were monitored before and after the procedure. Patient was prepped and draped in the usual sterile fashion. The correct patient, procedure, and site was verified.   Injection Procedure Details:   Procedure diagnoses: Lumbar radiculopathy [M54.16]    Meds Administered:  Meds ordered this encounter  Medications   methylPREDNISolone acetate (DEPO-MEDROL) injection 40 mg    Laterality: Bilateral  Location/Site: L4  Needle:5.0 in., 22 ga.  Short bevel or Quincke spinal needle  Needle Placement: Transforaminal  Findings:    -Comments: Excellent flow of contrast along the nerve, nerve root and into the epidural space.  Procedure Details: After squaring off the end-plates to get a true AP view, the C-arm was positioned so that an oblique view of the foramen as noted above was visualized. The target area is just inferior to the "nose of the scotty dog" or sub pedicular. The soft tissues overlying this structure were infiltrated with 2-3 ml. of 1% Lidocaine without Epinephrine.  The spinal needle was inserted toward the target using a "trajectory" view along the fluoroscope beam.  Under AP and lateral visualization, the needle was advanced so it did not puncture dura and was located close the 6 O'Clock position of the pedical in AP tracterory. Biplanar projections were used to confirm position. Aspiration was confirmed to be negative for CSF and/or blood. A 1-2 ml. volume of Isovue-250 was injected and flow of contrast was noted at each level. Radiographs were obtained for documentation purposes.   After attaining the  desired flow of contrast documented above, a 0.5 to 1.0 ml test dose of 0.25% Marcaine was injected into each respective transforaminal space.  The patient was observed for 90 seconds post injection.  After no sensory deficits were reported, and normal lower extremity motor function was noted,   the above injectate was administered so that equal amounts of the injectate were placed at each foramen (level) into the transforaminal epidural space.   Additional Comments:  No complications occurred Dressing: 2 x 2 sterile gauze and Band-Aid    Post-procedure details: Patient was observed during the procedure. Post-procedure instructions were reviewed.  Patient left the clinic in stable condition.

## 2022-11-16 NOTE — Telephone Encounter (Signed)
Patient's daughter Clayborne Dana returned call asked for a call back. The number to contact Clayborne Dana  is  [phone]

## 2022-11-16 NOTE — Telephone Encounter (Signed)
LVM to return call to get more information 

## 2022-11-16 NOTE — Progress Notes (Signed)
Patient Care Team: Daisy Floro, MD as PCP - General (Family Medicine) Duke Salvia, MD as PCP - Electrophysiology (Cardiology) Bensimhon, Bevelyn Buckles, MD as PCP - Cardiology (Cardiology)   HPI  Alexandra [name] is a 89 y.o. female Seen following  CRT-p  Implant 2016 GEN change 2023  She has a history of nonischemic cardiomyopathy and left bundle branch block. It was initially thought to be related to [place] when she presented 7/16 EF at that time was 20%. She was admitted 9/16 with low output and was treated with milrinone,  down titration of which was required because of tachycardia. 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     The patient denies chest pain,nocturnal dyspnea, orthopnea or peripheral edema.  There have been no palpitations, lightheadedness or syncope. Dyspnea -- stable  followed in ICM clinic     .   DATE TEST EF   9/16 Echo    15 %   2/17 Echo  40-45 %   11/18 Echo  45-50%   7/20 Echo  55%   10/23 Echo  40-45%       Date Cr K TSH Hgb  1/18 1.5 3.8  12.2  11/18 1.46 4.4    5/19 1.41 4.8      7/20 1..79 4.0  13.4  12/20 1.6>>1.5 4.0    5/21 1.69 4.1 1.25 13.0  10/23 1.46 4.7  13.4        Blood pressure has been reasonably controlled.  Past Medical History:  Diagnosis Date   Adult hypothyroidism 04/02/2014   Anemia of chronic disease 08/13/2014   Anxiety    Arthritis    Asthma    breast ca dx'd 2000   left   Bursitis    right hip   CAP (community acquired pneumonia) 12/19/2012   Chest pain    NM stress test 05/2011 Normal   CHF (congestive heart failure) (HCC) 08/19/2014   CKD (chronic kidney disease) stage 3, GFR 30-59 ml/min (HCC) 08/13/2014   Depression    DOE (dyspnea on exertion)    No SOB at rest   Essential (primary) hypertension 04/02/2014   Fatigue    excertional   Headache(784.0)    Hereditary and idiopathic  peripheral neuropathy 07/02/2014   Hypercholesteremia    Intermittent LBBB (left bundle branch block) 12/21/2012   Leg weakness    Neuropathy    Obesity    Osteopenia    Peripheral neuropathy    Presence of permanent cardiac pacemaker 01/16/2015   BIV   Sacroiliitis, not elsewhere classified (HCC)    Situational depression    Trigger finger    Dr. Kellie Simmering    Past Surgical History:  Procedure Laterality Date   ACHILLES TENDON SURGERY     BIV PACEMAKER GENERATOR CHANGEOUT N/A 08/14/2021   Procedure: BIV PACEMAKER GENERATOR CHANGEOUT;  Surgeon: Duke Salvia, MD;  Location: [facility] INVASIVE CV LAB;  Service: Cardiovascular;  Laterality: N/A;   BREAST LUMPECTOMY     breast cancer   CARDIAC CATHETERIZATION N/A 07/21/2015   Procedure: Right Heart Cath;  Surgeon: Dolores [name], MD;  Location: [facility] INVASIVE CV LAB;  Service: Cardiovascular;  Laterality: N/A;   CHOLECYSTECTOMY N/A 08/13/2014   Procedure: LAPAROSCOPIC CHOLECYSTECTOMY;  Surgeon: Almond Lint, MD;  Location: [facility] ORS;  Service: General;  Laterality: N/A;   EP IMPLANTABLE DEVICE N/A 01/16/2015   Procedure: BiV Pacemaker Insertion  CRT-P;  Surgeon: Duke Salvia, MD;  Location: [facility] INVASIVE CV LAB;  Service: Cardiovascular;  Laterality: N/A;   LEG SURGERY     LUMBAR LAMINECTOMY     POLYPECTOMY     TONSILLECTOMY     VESICOVAGINAL FISTULA CLOSURE W/ TAH     DC hysterectomy    Current Outpatient Medications  Medication Sig Dispense Refill   acetaminophen-codeine (TYLENOL #3) 300-30 MG tablet Take 1 tablet by mouth every 6 (six) hours as needed for moderate pain. 30 tablet 0   albuterol (PROVENTIL HFA;VENTOLIN HFA) 108 (90 BASE) MCG/ACT inhaler Inhale 2 puffs into the lungs every 6 (six) hours as needed for wheezing or shortness of breath. Reported on 05/27/2015     ALPRAZolam (XANAX) 0.25 MG tablet Take 0.25 mg by mouth 3 (three) times daily as needed for anxiety. Reported on 05/27/2015     amoxicillin (AMOXIL) 500 MG capsule Take 4  capsules by mouth as needed 1 hour before dental work 4 capsule prn   b complex vitamins tablet Take 1 tablet by mouth daily with lunch.      calcium carbonate (TUMS - DOSED IN MG ELEMENTAL CALCIUM) 500 MG chewable tablet Chew 2 tablets by mouth daily as needed for indigestion or heartburn. Reported on 07/18/2015     cholecalciferol (VITAMIN D) 1000 UNITS tablet Take 1,000 Units by mouth daily with lunch.      citalopram (CELEXA) 10 MG tablet Take 10 mg by mouth daily.     ELIQUIS 2.5 MG TABS tablet TAKE 1 TABLET(2.5 MG) BY MOUTH TWICE DAILY 60 tablet 11   famotidine (PEPCID) 20 MG tablet Take 20 mg by mouth daily.     ferrous sulfate 325 (65 FE) MG tablet Take 325 mg by mouth daily with breakfast.     furosemide (LASIX) 40 MG tablet Take one tablet (40 mg) by mouth once every other day. May take extra as needed. 30 tablet 3   hydrALAZINE (APRESOLINE) 10 MG tablet Take 10 mg by mouth 3 (three) times daily.     isosorbide mononitrate (IMDUR) 30 MG 24 hr tablet Take 1 tablet (30 mg total) by mouth daily. 90 tablet 1   lactose free nutrition (BOOST) LIQD Take 237 mLs by mouth daily with breakfast.      levothyroxine (SYNTHROID, LEVOTHROID) 25 MCG tablet Take 25 mcg by mouth daily before breakfast. for thyroid     MAGNESIUM-OXIDE 400 (241.3 Mg) MG tablet TAKE 1/2 TABLET(200 MG) BY MOUTH DAILY 15 tablet 11   Multiple Vitamin (MULITIVITAMIN WITH MINERALS) TABS Take 1 tablet by mouth daily with lunch.      nitroGLYCERIN (NITROSTAT) 0.4 MG SL tablet Place 1 tablet (0.4 mg total) under the tongue every 5 (five) minutes as needed for chest pain. 25 tablet 3   Polyethyl Glycol-Propyl Glycol (SYSTANE OP) Place 1 drop into both eyes 2 (two) times daily.     potassium chloride (K-DUR) 10 MEQ tablet TAKE 1 TABLET BY MOUTH DAILY 90 tablet 2   spironolactone (ALDACTONE) 25 MG tablet Take 25 mg by mouth daily.     No current facility-administered medications for this visit.    Allergies  Allergen Reactions    Amiodarone Shortness Of Breath and Nausea And Vomiting   Cymbalta [Duloxetine Hcl] Other (See Comments)    Depression   Lyrica [Pregabalin] Other (See Comments)    Blurry vision      Review of Systems negative except from HPI and PMH  Physical Exam  BP 134/66  Pulse 95   Ht 5\' 4"  (1.626 m)   Wt 165 lb 3.2 oz (74.9 kg)   SpO2 95%   BMI 28.36 kg/m  Well developed and well nourished in no acute distress HENT normal Neck supple with JVP-flat Clear Device pocket well healed; without hematoma or erythema.  There is no tethering  Regular rate and rhythm, no murmur Abd-soft with active BS No Clubbing cyanosis  edema Skin-warm and dry A & Oriented  Grossly normal sensory and motor function  ECGsinus with P-synchronous/ AV  pacing  Upright QRS V1; neg QRS in lead 1    Device function is normal. Programming changes none   See Paceart for details    [place] is elevated, she is followed by ICM clinic  Assessment and  Plan Nonischemic cardiomyopathy  Congestive heart failure-chronic-diastolic  ICD-CRT-D-St. Jude's   Renal dysfunction gd 3  Atrial Tachycardia  DVT/PE     With her cardiomyopathy continue hydralazine nitrates and spironolactone.  Will check blood work today.   Euvolemic.  Will continue spironolactone.    She takes Eliquis for her history of DVT/PE started 2017 with recommendation for long-term therapy   Will check CBC and renal function and potassium levels  No interval arrhythmia of note

## 2022-11-16 NOTE — Patient Instructions (Addendum)
Medication Instructions:  Your physician recommends that you continue on your current medications as directed. Please refer to the Current Medication list given to you today.  *If you need a refill on your cardiac medications before your next appointment, please call your pharmacy*   Lab Work: CBC and BMET  If you have labs (blood work) drawn today and your tests are completely normal, you will receive your results only by: MyChart Message (if you have MyChart) OR A paper copy in the mail If you have any lab test that is abnormal or we need to change your treatment, we will call you to review the results.   Testing/Procedures: None ordered.    Follow-Up: At [facility], you and your health needs are our priority.  As part of our continuing mission to provide you with exceptional heart care, we have created designated Provider Care Teams.  These Care Teams include your primary Cardiologist (physician) and Advanced Practice Providers (APPs -  Physician Assistants and Nurse Practitioners) who all work together to provide you with the care you need, when you need it.  We recommend signing up for the patient portal called "MyChart".  Sign up information is provided on this After Visit Summary.  MyChart is used to connect with patients for Virtual Visits (Telemedicine).  Patients are able to view lab/test results, encounter notes, upcoming appointments, etc.  Non-urgent messages can be sent to your provider as well.   To learn more about what you can do with MyChart, go to [email or web address].    Your next appointment:   Pt is due to see Dr Gala Romney   12 months with Dr Graciela Husbands

## 2022-11-16 NOTE — Progress Notes (Signed)
Alexandra [name] - 89 y.o. female MRN [id number]  Date of birth: 06-05-1927  Office Visit Note: Visit Date: 11/01/2022 PCP: Daisy Floro, MD Referred by: Daisy Floro, MD  Subjective: Chief Complaint  Patient presents with   Lower Back - Pain   HPI:  Alexandra [name] is a 89 y.o. female who comes in today at the request of Rexene Edison, PA-C for planned Bilateral L4-5 Lumbar Transforaminal epidural steroid injection with fluoroscopic guidance.  The patient has failed conservative care including home exercise, medications, time and activity modification.  This injection will be diagnostic and hopefully therapeutic.  Please see requesting physician notes for further details and justification.  I have seen her in the past but she is currently been followed by Rexene Edison, P.A.-C who obtained lumbar myelogram recently.  This was reviewed with the patient and reviewed today in the notes.  Depending on relief to date would consider medial branch blocks with goal towards radiofrequency ablation for more axial back pain.   ROS Otherwise per HPI.  Assessment & Plan: Visit Diagnoses:    ICD-10-CM   1. Lumbar radiculopathy  M54.16 XR C-ARM NO REPORT    Epidural Steroid injection    methylPREDNISolone acetate (DEPO-MEDROL) injection 40 mg      Plan: No additional findings.   Meds & Orders:  Meds ordered this encounter  Medications   methylPREDNISolone acetate (DEPO-MEDROL) injection 40 mg    Orders Placed This Encounter  Procedures   XR C-ARM NO REPORT   Epidural Steroid injection    Follow-up: Return for visit to requesting provider as needed.   Procedures: No procedures performed  Lumbosacral Transforaminal Epidural Steroid Injection - Sub-Pedicular Approach with Fluoroscopic Guidance  Patient: Alexandra [name]      Date of Birth: [birth year] MRN: [id number] PCP: Daisy Floro, MD      Visit Date: 11/01/2022   Universal Protocol:    Date/Time: 11/01/2022  Consent  Given By: the patient  Position: PRONE  Additional Comments: Vital signs were monitored before and after the procedure. Patient was prepped and draped in the usual sterile fashion. The correct patient, procedure, and site was verified.   Injection Procedure Details:   Procedure diagnoses: Lumbar radiculopathy [M54.16]    Meds Administered:  Meds ordered this encounter  Medications   methylPREDNISolone acetate (DEPO-MEDROL) injection 40 mg    Laterality: Bilateral  Location/Site: L4  Needle:5.0 in., 22 ga.  Short bevel or Quincke spinal needle  Needle Placement: Transforaminal  Findings:    -Comments: Excellent flow of contrast along the nerve, nerve root and into the epidural space.  Procedure Details: After squaring off the end-plates to get a true AP view, the C-arm was positioned so that an oblique view of the foramen as noted above was visualized. The target area is just inferior to the "nose of the scotty dog" or sub pedicular. The soft tissues overlying this structure were infiltrated with 2-3 ml. of 1% Lidocaine without Epinephrine.  The spinal needle was inserted toward the target using a "trajectory" view along the fluoroscope beam.  Under AP and lateral visualization, the needle was advanced so it did not puncture dura and was located close the 6 O'Clock position of the pedical in AP tracterory. Biplanar projections were used to confirm position. Aspiration was confirmed to be negative for CSF and/or blood. A 1-2 ml. volume of Isovue-250 was injected and flow of contrast was noted at each level. Radiographs were obtained for documentation purposes.  After attaining the desired flow of contrast documented above, a 0.5 to 1.0 ml test dose of 0.25% Marcaine was injected into each respective transforaminal space.  The patient was observed for 90 seconds post injection.  After no sensory deficits were reported, and normal lower extremity motor function was noted,   the  above injectate was administered so that equal amounts of the injectate were placed at each foramen (level) into the transforaminal epidural space.   Additional Comments:  No complications occurred Dressing: 2 x 2 sterile gauze and Band-Aid    Post-procedure details: Patient was observed during the procedure. Post-procedure instructions were reviewed.  Patient left the clinic in stable condition.    Clinical History: Narrative & Impression CLINICAL DATA:  Progressive low back pain.  Pacemaker.   EXAM: LUMBAR MYELOGRAM   CT LUMBAR SPINE WITH INTRATHECAL CONTRAST   FLUOROSCOPY: Radiation Exposure Index (as provided by the fluoroscopic device): 21.1 mGy air Kerma   TECHNIQUE: The procedure, risks (including but not limited to bleeding, infection, organ damage ), benefits, and alternatives were explained to the patient. Questions regarding the procedure were encouraged and answered. The patient understands and consents to the procedure. An appropriate entry site was determined under fluoroscopy. Operator donned sterile gloves and mask. Skin site was marked, prepped with Betadine, and draped in usual sterile fashion, and infiltrated locally with 1% lidocaine. A 22 gauge spinal needle was advanced into the thecal sac at L5 from a left parasagittal approach. Clear colorless CSF returned. 17 ml Omnipaque 180 were administered intrathecally for lumbar myelography, followed by axial CT scanning of the lumbar spine. I personally performed the lumbar puncture and administered the intrathecal contrast. I also personally supervised acquisition of the myelogram images. coronal and sagittal reconstructions were generated from the axial scan. This exam was performed according to the departmental dose-optimization program which includes automated exposure control, adjustment of the mA and/or kV according to patient size and/or use of iterative reconstruction technique.   COMPARISON:   10/09/2020   FINDINGS: 5 non-rib-bearing lumbar segments assigned L1-L5. There is a mild thoracolumbar scoliosis apex to the left at L3-4. Stable mild L4 superior endplate compression deformity with less than 20% loss of height.   T12-L1: Mild narrowing of the interspace with early vacuum phenomenon. Small left paracentral disc protrusion. No spinal or foraminal stenosis. Large anterior endplate spurs.   L1-2: Conus terminates at the interspace. Moderate narrowing of the interspace left greater than right. Circumferential disc bulge with endplate spurs. There is early facet DJD contributing to mild left subarticular recess encroachment. Central canal and foramina patent.   L2-3: Large right lateral bridging spurs. Minimal disc bulge contributing to left foraminal encroachment. Central canal patent.   L3-4: Mild narrowing of the interspace right greater than left with exuberant right lateral spurs. Stable changes of posterior decompression. Canal is capacious. There is a broad posterior disc bulge partially calcified. Mild bilateral facet DJD with bilateral foraminal encroachment, right greater than left.   L4-5: Continuation of posterior decompression with capacious thecal sac. Narrowing of the interspace with vacuum phenomenon right greater than left. Bilateral facet DJD. Foraminal encroachment bilaterally.   L5-S1: Vacuum phenomenon in the interspace. Moderate bilateral facet DJD. No spinal stenosis. Foraminal encroachment bilaterally.   No paraspinal mass or hematoma. Moderate scattered aortoiliac calcified plaque without AAA. Cholecystectomy clips. Splenic artery calcifications.   IMPRESSION: 1. Stable changes of posterior decompression L3-4 and L4-5 with capacious thecal sac. 2. Multilevel spondylosis with foraminal encroachment as above. 3. Chronic  mild L4 superior endplate compression deformity.   Aortic Atherosclerosis (ICD10-I70.0).     Electronically Signed    By: Corlis Leak M.D.   On: 10/25/2022 14:37     Objective:  VS:  HT:    WT:   BMI:     BP:   HR: bpm  TEMP: ( )  RESP:  Physical Exam Vitals and nursing note reviewed.  Constitutional:      General: She is not in acute distress.    Appearance: Normal appearance. She is not ill-appearing.  HENT:     Head: Normocephalic and atraumatic.     Right Ear: External ear normal.     Left Ear: External ear normal.  Eyes:     Extraocular Movements: Extraocular movements intact.  Cardiovascular:     Rate and Rhythm: Normal rate.     Pulses: Normal pulses.  Pulmonary:     Effort: Pulmonary effort is normal. No respiratory distress.  Abdominal:     General: There is no distension.     Palpations: Abdomen is soft.  Musculoskeletal:        General: Tenderness present.     Cervical back: Neck supple.     Right lower leg: No edema.     Left lower leg: No edema.     Comments: Patient has good distal strength with no pain over the greater trochanters.  No clonus or focal weakness.  Skin:    Findings: No erythema, lesion or rash.  Neurological:     General: No focal deficit present.     Mental Status: She is alert and oriented to person, place, and time.     Sensory: No sensory deficit.     Motor: No weakness or abnormal muscle tone.     Coordination: Coordination normal.  Psychiatric:        Mood and Affect: Mood normal.        Behavior: Behavior normal.      Imaging: No results found.

## 2022-11-17 ENCOUNTER — Encounter (HOSPITAL_COMMUNITY): Payer: Self-pay | Admitting: Internal Medicine

## 2022-11-17 ENCOUNTER — Ambulatory Visit (HOSPITAL_COMMUNITY)
Admission: RE | Admit: 2022-11-17 | Discharge: 2022-11-17 | Disposition: A | Discharge status: Home / Self Care | Payer: Medicare HMO | Source: Ambulatory Visit | Attending: Internal Medicine | Admitting: Internal Medicine

## 2022-11-17 VITALS — BP 132/62 | HR 100 | Wt 165.8 lb

## 2022-11-17 DIAGNOSIS — Z95810 Presence of automatic (implantable) cardiac defibrillator: Secondary | ICD-10-CM | POA: Diagnosis not present

## 2022-11-17 DIAGNOSIS — Z950 Presence of cardiac pacemaker: Secondary | ICD-10-CM

## 2022-11-17 DIAGNOSIS — I10 Essential (primary) hypertension: Secondary | ICD-10-CM

## 2022-11-17 DIAGNOSIS — Z008 Encounter for other general examination: Secondary | ICD-10-CM | POA: Diagnosis not present

## 2022-11-17 DIAGNOSIS — I5022 Chronic systolic (congestive) heart failure: Secondary | ICD-10-CM | POA: Diagnosis not present

## 2022-11-17 LAB — CBC
Hematocrit: 41.3 % (ref 34.0–46.6)
Hemoglobin: 13.5 g/dL (ref 11.1–15.9)
MCH: 32.6 pg (ref 26.6–33.0)
MCHC: 32.7 g/dL (ref 31.5–35.7)
MCV: 100 fL — ABNORMAL HIGH (ref 79–97)
Platelets: 167 10*3/uL (ref 150–450)
RBC: 4.14 x10E6/uL (ref 3.77–5.28)
RDW: 12.5 % (ref 11.7–15.4)
WBC: 8 10*3/uL (ref 3.4–10.8)

## 2022-11-17 LAB — BASIC METABOLIC PANEL
BUN/Creatinine Ratio: 35 — ABNORMAL HIGH (ref 12–28)
BUN: 39 mg/dL — ABNORMAL HIGH (ref 10–36)
CO2: 26 mmol/L (ref 20–29)
Calcium: 9.8 mg/dL (ref 8.7–10.3)
Chloride: 100 mmol/L (ref 96–106)
Creatinine, Ser: 1.1 mg/dL — ABNORMAL HIGH (ref 0.57–1.00)
Glucose: 87 mg/dL (ref 70–99)
Potassium: 4.5 mmol/L (ref 3.5–5.2)
Sodium: 142 mmol/L (ref 134–144)
eGFR: 47 mL/min/{1.73_m2} — ABNORMAL LOW (ref >59)

## 2022-11-18 NOTE — Telephone Encounter (Signed)
LVM to return call to get more information 

## 2022-11-21 LAB — CUP PACEART INCLINIC DEVICE CHECK
Date Time Interrogation Session: 20241029181423
Implantable Lead Connection Status: 753985
Implantable Lead Connection Status: 753985
Implantable Lead Connection Status: 753985
Implantable Lead Implant Date: 20161229
Implantable Lead Implant Date: 20161229
Implantable Lead Implant Date: 20161229
Implantable Lead Location: 753858
Implantable Lead Location: 753859
Implantable Lead Location: 753860
Implantable Pulse Generator Implant Date: 20230728
Pulse Gen Model: 3562
Pulse Gen Serial Number: 8101642

## 2022-11-23 ENCOUNTER — Telehealth: Payer: Self-pay | Admitting: Physical Medicine and Rehabilitation

## 2022-11-23 NOTE — Telephone Encounter (Signed)
Patient called. Says the injection did not help. Still in pain. Cb#[phone]

## 2022-11-23 NOTE — Telephone Encounter (Signed)
Patient daughter called returning the call back from brittany. The shot didn't work. CB#[phone]

## 2022-11-24 ENCOUNTER — Telehealth: Payer: Self-pay | Admitting: Physical Medicine and Rehabilitation

## 2022-11-24 NOTE — Telephone Encounter (Signed)
Patient called said she waiting to get scheduled for an epidural injection. CB#[phone]

## 2022-11-24 NOTE — Telephone Encounter (Signed)
Patient called returning a call back. CB#[phone]

## 2022-11-25 ENCOUNTER — Telehealth: Payer: Self-pay | Admitting: Physical Medicine and Rehabilitation

## 2022-11-25 NOTE — Telephone Encounter (Signed)
Patient daughter called and said her mom had the shot and it didn't work. She mentioned that Alexandra [name] had questions for her. CB#[phone]

## 2022-11-26 ENCOUNTER — Telehealth: Payer: Self-pay

## 2022-11-26 ENCOUNTER — Encounter: Payer: Self-pay | Admitting: Internal Medicine

## 2022-11-26 ENCOUNTER — Telehealth: Payer: Self-pay | Admitting: Physical Medicine and Rehabilitation

## 2022-11-26 ENCOUNTER — Other Ambulatory Visit: Payer: Self-pay | Admitting: Physical Medicine and Rehabilitation

## 2022-11-26 DIAGNOSIS — M5416 Radiculopathy, lumbar region: Secondary | ICD-10-CM

## 2022-11-26 NOTE — Telephone Encounter (Signed)
Good Afternoon Dr. Gala Romney  We have received a surgical clearance request for Ms. Alexandra [name] who will be completing an epidural steroid injection. They were seen recently in clinic on 11/17/2022. Can you please comment on surgical clearance for upcoming steroid injection procedure.  Please forward you guidance and recommendations to P CV DIV PREOP   Robin Searing, NP

## 2022-11-26 NOTE — Progress Notes (Signed)
PERIOPERATIVE PRESCRIPTION FOR IMPLANTED CARDIAC DEVICE PROGRAMMING  Patient Information: Name:  Alexandra [name]  DOB:  [birth year]  MRN:  [id number]    Procedure:  epidural steroid injection    Date of Surgery:  Clearance TBD                                 Surgeon:  Dr.Newton Surgeon's Group or Practice Name:  [facility] at [facility]  Phone number:  [phone] Fax number:  [phone] Device Information:  Clinic EP Physician:  Sherryl Manges, MD   Device Type:  Pacemaker Manufacturer and Phone #:  St. Jude/Abbott: [phone] Pacemaker Dependent?:  No. Date of Last Device Check:  11/16/22 In office  Normal Device Function?:  Yes.    Electrophysiologist's Recommendations:  Have magnet available. Provide continuous ECG monitoring when magnet is used or reprogramming is to be performed.  Procedure should not interfere with device function.  No device programming or magnet placement needed.  Per Device Clinic Standing Orders, Kizzie Ide, RN  4:47 PM 11/26/2022

## 2022-11-26 NOTE — Telephone Encounter (Signed)
Pt's daughter Vandale called again to set an appt for her mother. She states her mother is in severe pains. Please call [place] at [phone]. To set this appt.

## 2022-11-26 NOTE — Telephone Encounter (Signed)
   Pre-operative Risk Assessment    Patient Name: Alexandra [name]  DOB: August 23, 1927 MRN: [id number]  Last ov:11/16/22 Upcoming visit:Unknown      Request for Surgical Clearance    Procedure:   Spine procedure such as an epidural steroid injection   Date of Surgery:  Clearance TBD                                 Surgeon:  Dr.Newton Surgeon's Group or Practice Name:  [facility] at [facility]  Phone number:  [phone] Fax number:  [phone]   Type of Clearance Requested:   - Medical  - Pharmacy:  Hold Apixaban (Eliquis) discontinue 2 days prior and the medication may be resumed once the procedure is completed    Type of Anesthesia:  Not Indicated   Additional requests/questions:    Vance Peper   11/26/2022, 12:10 PM

## 2022-11-28 NOTE — Telephone Encounter (Signed)
   Patient Name: Alexandra [name]  DOB: [birth year] MRN: [id number]  Primary Cardiologist: Arvilla Meres, MD  Chart reviewed as part of pre-operative protocol coverage. Given past medical history and time since last visit, based on ACC/AHA guidelines, Alexandra [name] is at acceptable risk for the planned procedure without further cardiovascular testing.   Please advise patient to hold Eliquis 3 full days prior to procedure and resume the day following procedure if uneventful.  The patient was advised that if she develops new symptoms prior to surgery to contact our office to arrange for a follow-up visit, and she verbalized understanding.  I will route this recommendation to the requesting party via Epic fax function and remove from pre-op pool.  Please call with questions.  Napoleon Form, Leodis Rains, NP 11/28/2022, 12:26 PM

## 2022-11-29 ENCOUNTER — Telehealth: Payer: Self-pay | Admitting: Physical Medicine and Rehabilitation

## 2022-11-29 ENCOUNTER — Encounter: Payer: Self-pay | Admitting: Internal Medicine

## 2022-11-29 NOTE — Telephone Encounter (Signed)
   Patient Name: Alexandra [name]  DOB: 06/16/1927 MRN: [id number]  Primary Cardiologist: Arvilla Meres, MD  Chart reviewed as part of pre-operative protocol coverage. Given past medical history and time since last visit, based on ACC/AHA guidelines, Alexandra [name] is at acceptable risk for the planned procedure without further cardiovascular testing.   Per Dr. Gala Romney:  Ok to proceed. Given epidural injection and her age. Would hold Eliquis for 3 full days prior to injection and resume the day after the injection if uneventful.      The patient was advised that if she develops new symptoms prior to surgery to contact our office to arrange for a follow-up visit, and she verbalized understanding.  I will route this recommendation to the requesting party via Epic fax function and remove from pre-op pool.  Please call with questions.  Joni Reining, NP 11/29/2022, 2:47 PM

## 2022-11-29 NOTE — Telephone Encounter (Signed)
Pt reports greater than 50% relief of pain with bilateral L4 transforaminal epidural steroid injection performed in our office on 11/01/2022. This injection provided her with significant pain relief and increased functional ability for several weeks. Her pain has gradually increased, we would like to try interlaminar approach to see if this will provide longer sustained pain relief.

## 2022-12-07 ENCOUNTER — Other Ambulatory Visit: Payer: Self-pay

## 2022-12-07 ENCOUNTER — Ambulatory Visit: Payer: Medicare HMO | Admitting: Physical Medicine and Rehabilitation

## 2022-12-07 DIAGNOSIS — M5416 Radiculopathy, lumbar region: Secondary | ICD-10-CM | POA: Diagnosis not present

## 2022-12-07 DIAGNOSIS — M47816 Spondylosis without myelopathy or radiculopathy, lumbar region: Secondary | ICD-10-CM

## 2022-12-07 DIAGNOSIS — R269 Unspecified abnormalities of gait and mobility: Secondary | ICD-10-CM

## 2022-12-07 DIAGNOSIS — M961 Postlaminectomy syndrome, not elsewhere classified: Secondary | ICD-10-CM

## 2022-12-07 NOTE — Patient Instructions (Signed)

## 2022-12-07 NOTE — Progress Notes (Signed)
Functional Pain Scale - descriptive words and definitions  Moderate (4)   Constantly aware of pain, can complete ADLs with modification/sleep marginally affected at times/passive distraction is of no use, but active distraction gives some relief. Moderate range order  Average Pain 4 121/75 She stated she has some restless leg at times.   +Driver, -BT off Eliquis, -Dye Allergies.

## 2022-12-09 DIAGNOSIS — Z1331 Encounter for screening for depression: Secondary | ICD-10-CM | POA: Diagnosis not present

## 2022-12-09 DIAGNOSIS — Z0000 Encounter for general adult medical examination without abnormal findings: Secondary | ICD-10-CM | POA: Diagnosis not present

## 2022-12-13 DIAGNOSIS — E039 Hypothyroidism, unspecified: Secondary | ICD-10-CM | POA: Diagnosis not present

## 2022-12-13 DIAGNOSIS — I130 Hypertensive heart and chronic kidney disease with heart failure and stage 1 through stage 4 chronic kidney disease, or unspecified chronic kidney disease: Secondary | ICD-10-CM | POA: Diagnosis not present

## 2022-12-13 DIAGNOSIS — I10 Essential (primary) hypertension: Secondary | ICD-10-CM | POA: Diagnosis not present

## 2022-12-13 DIAGNOSIS — I509 Heart failure, unspecified: Secondary | ICD-10-CM | POA: Diagnosis not present

## 2022-12-13 DIAGNOSIS — D6859 Other primary thrombophilia: Secondary | ICD-10-CM | POA: Diagnosis not present

## 2022-12-13 DIAGNOSIS — Z23 Encounter for immunization: Secondary | ICD-10-CM | POA: Diagnosis not present

## 2022-12-13 DIAGNOSIS — N1831 Chronic kidney disease, stage 3a: Secondary | ICD-10-CM | POA: Diagnosis not present

## 2022-12-13 DIAGNOSIS — Z6829 Body mass index (BMI) 29.0-29.9, adult: Secondary | ICD-10-CM | POA: Diagnosis not present

## 2022-12-13 DIAGNOSIS — E7800 Pure hypercholesterolemia, unspecified: Secondary | ICD-10-CM | POA: Diagnosis not present

## 2022-12-13 DIAGNOSIS — Z950 Presence of cardiac pacemaker: Secondary | ICD-10-CM | POA: Diagnosis not present

## 2022-12-13 DIAGNOSIS — Z0000 Encounter for general adult medical examination without abnormal findings: Secondary | ICD-10-CM | POA: Diagnosis not present

## 2022-12-13 DIAGNOSIS — E559 Vitamin D deficiency, unspecified: Secondary | ICD-10-CM | POA: Diagnosis not present

## 2022-12-14 NOTE — Procedures (Signed)
Lumbar Epidural Steroid Injection - Interlaminar Approach with Fluoroscopic Guidance  Patient: Alexandra [name]      Date of Birth: 1927/05/25 MRN: [id number] PCP: Daisy Floro, MD      Visit Date: 12/07/2022   Universal Protocol:     Consent Given By: the patient  Position: PRONE  Additional Comments: Vital signs were monitored before and after the procedure. Patient was prepped and draped in the usual sterile fashion. The correct patient, procedure, and site was verified.   Injection Procedure Details:   Procedure diagnoses: Lumbar radiculopathy [M54.16]   Meds Administered: No orders of the defined types were placed in this encounter.    Laterality: Right  Location/Site:  L5-S1  Needle: 3.5 in., 20 ga. Tuohy  Needle Placement: Paramedian epidural  Findings:   -Comments: Excellent flow of contrast into the epidural space.  Procedure Details: Using a paramedian approach from the side mentioned above, the region overlying the inferior lamina was localized under fluoroscopic visualization and the soft tissues overlying this structure were infiltrated with 4 ml. of 1% Lidocaine without Epinephrine. The Tuohy needle was inserted into the epidural space using a paramedian approach.   The epidural space was localized using loss of resistance along with counter oblique bi-planar fluoroscopic views.  After negative aspirate for air, blood, and CSF, a 2 ml. volume of Isovue-250 was injected into the epidural space and the flow of contrast was observed. Radiographs were obtained for documentation purposes.    The injectate was administered into the level noted above.   Additional Comments:  No complications occurred Dressing: 2 x 2 sterile gauze and Band-Aid    Post-procedure details: Patient was observed during the procedure. Post-procedure instructions were reviewed.  Patient left the clinic in stable condition.

## 2022-12-14 NOTE — Progress Notes (Signed)
Alexandra [name] - 89 y.o. female MRN [id number]  Date of birth: 1927/12/18  Office Visit Note: Visit Date: 12/07/2022 PCP: Daisy Floro, MD Referred by: Daisy Floro, MD  Subjective: Chief Complaint  Patient presents with   Lower Back - Pain   HPI:  Alexandra [name] is a 89 y.o. female who comes in today at the request of Rexene Edison, PA-C for planned Right L5-S1 Lumbar Interlaminar epidural steroid injection with fluoroscopic guidance.  The patient has failed conservative care including home exercise, medications, time and activity modification.  This injection will be diagnostic and hopefully therapeutic.  Please see requesting physician notes for further details and justification. Injection was having trouble being approved. Family aware.   ROS Otherwise per HPI.  Assessment & Plan: Visit Diagnoses:    ICD-10-CM   1. Lumbar radiculopathy  M54.16 XR C-ARM NO REPORT    Epidural Steroid injection    Ambulatory referral to Physical Therapy    2. Spondylosis without myelopathy or radiculopathy, lumbar region  M47.816 Ambulatory referral to Physical Therapy    3. Post laminectomy syndrome  M96.1 Ambulatory referral to Physical Therapy    4. Gait abnormality  R26.9 Ambulatory referral to Physical Therapy      Plan: No additional findings.   Meds & Orders: No orders of the defined types were placed in this encounter.   Orders Placed This Encounter  Procedures   XR C-ARM NO REPORT   Ambulatory referral to Physical Therapy   Epidural Steroid injection    Follow-up: Return for visit to requesting provider as needed.   Procedures: No procedures performed  Lumbar Epidural Steroid Injection - Interlaminar Approach with Fluoroscopic Guidance  Patient: Alexandra [name]      Date of Birth: 05-14-1927 MRN: [id number] PCP: Daisy Floro, MD      Visit Date: 12/07/2022   Universal Protocol:     Consent Given By: the patient  Position: PRONE  Additional  Comments: Vital signs were monitored before and after the procedure. Patient was prepped and draped in the usual sterile fashion. The correct patient, procedure, and site was verified.   Injection Procedure Details:   Procedure diagnoses: Lumbar radiculopathy [M54.16]   Meds Administered: No orders of the defined types were placed in this encounter.    Laterality: Right  Location/Site:  L5-S1  Needle: 3.5 in., 20 ga. Tuohy  Needle Placement: Paramedian epidural  Findings:   -Comments: Excellent flow of contrast into the epidural space.  Procedure Details: Using a paramedian approach from the side mentioned above, the region overlying the inferior lamina was localized under fluoroscopic visualization and the soft tissues overlying this structure were infiltrated with 4 ml. of 1% Lidocaine without Epinephrine. The Tuohy needle was inserted into the epidural space using a paramedian approach.   The epidural space was localized using loss of resistance along with counter oblique bi-planar fluoroscopic views.  After negative aspirate for air, blood, and CSF, a 2 ml. volume of Isovue-250 was injected into the epidural space and the flow of contrast was observed. Radiographs were obtained for documentation purposes.    The injectate was administered into the level noted above.   Additional Comments:  No complications occurred Dressing: 2 x 2 sterile gauze and Band-Aid    Post-procedure details: Patient was observed during the procedure. Post-procedure instructions were reviewed.  Patient left the clinic in stable condition.   Clinical History: Narrative & Impression CLINICAL DATA:  Progressive low back pain.  Pacemaker.  EXAM: LUMBAR MYELOGRAM   CT LUMBAR SPINE WITH INTRATHECAL CONTRAST   FLUOROSCOPY: Radiation Exposure Index (as provided by the fluoroscopic device): 21.1 mGy air Kerma   TECHNIQUE: The procedure, risks (including but not limited to  bleeding, infection, organ damage ), benefits, and alternatives were explained to the patient. Questions regarding the procedure were encouraged and answered. The patient understands and consents to the procedure. An appropriate entry site was determined under fluoroscopy. Operator donned sterile gloves and mask. Skin site was marked, prepped with Betadine, and draped in usual sterile fashion, and infiltrated locally with 1% lidocaine. A 22 gauge spinal needle was advanced into the thecal sac at L5 from a left parasagittal approach. Clear colorless CSF returned. 17 ml Omnipaque 180 were administered intrathecally for lumbar myelography, followed by axial CT scanning of the lumbar spine. I personally performed the lumbar puncture and administered the intrathecal contrast. I also personally supervised acquisition of the myelogram images. coronal and sagittal reconstructions were generated from the axial scan. This exam was performed according to the departmental dose-optimization program which includes automated exposure control, adjustment of the mA and/or kV according to patient size and/or use of iterative reconstruction technique.   COMPARISON:  10/09/2020   FINDINGS: 5 non-rib-bearing lumbar segments assigned L1-L5. There is a mild thoracolumbar scoliosis apex to the left at L3-4. Stable mild L4 superior endplate compression deformity with less than 20% loss of height.   T12-L1: Mild narrowing of the interspace with early vacuum phenomenon. Small left paracentral disc protrusion. No spinal or foraminal stenosis. Large anterior endplate spurs.   L1-2: Conus terminates at the interspace. Moderate narrowing of the interspace left greater than right. Circumferential disc bulge with endplate spurs. There is early facet DJD contributing to mild left subarticular recess encroachment. Central canal and foramina patent.   L2-3: Large right lateral bridging spurs. Minimal disc  bulge contributing to left foraminal encroachment. Central canal patent.   L3-4: Mild narrowing of the interspace right greater than left with exuberant right lateral spurs. Stable changes of posterior decompression. Canal is capacious. There is a broad posterior disc bulge partially calcified. Mild bilateral facet DJD with bilateral foraminal encroachment, right greater than left.   L4-5: Continuation of posterior decompression with capacious thecal sac. Narrowing of the interspace with vacuum phenomenon right greater than left. Bilateral facet DJD. Foraminal encroachment bilaterally.   L5-S1: Vacuum phenomenon in the interspace. Moderate bilateral facet DJD. No spinal stenosis. Foraminal encroachment bilaterally.   No paraspinal mass or hematoma. Moderate scattered aortoiliac calcified plaque without AAA. Cholecystectomy clips. Splenic artery calcifications.   IMPRESSION: 1. Stable changes of posterior decompression L3-4 and L4-5 with capacious thecal sac. 2. Multilevel spondylosis with foraminal encroachment as above. 3. Chronic mild L4 superior endplate compression deformity.   Aortic Atherosclerosis (ICD10-I70.0).     Electronically Signed   By: Corlis Leak M.D.   On: 10/25/2022 14:37     Objective:  VS:  HT:    WT:   BMI:     BP:   HR: bpm  TEMP: ( )  RESP:  Physical Exam Vitals and nursing note reviewed.  Constitutional:      General: She is not in acute distress.    Appearance: Normal appearance. She is not ill-appearing.  HENT:     Head: Normocephalic and atraumatic.     Right Ear: External ear normal.     Left Ear: External ear normal.  Eyes:     Extraocular Movements: Extraocular movements intact.  Cardiovascular:  Rate and Rhythm: Normal rate.     Pulses: Normal pulses.  Pulmonary:     Effort: Pulmonary effort is normal. No respiratory distress.  Abdominal:     General: There is no distension.     Palpations: Abdomen is soft.   Musculoskeletal:        General: Tenderness present.     Cervical back: Neck supple.     Right lower leg: No edema.     Left lower leg: No edema.     Comments: Patient has good distal strength with no pain over the greater trochanters.  No clonus or focal weakness.  Skin:    Findings: No erythema, lesion or rash.  Neurological:     General: No focal deficit present.     Mental Status: She is alert and oriented to person, place, and time.     Sensory: No sensory deficit.     Motor: No weakness or abnormal muscle tone.     Coordination: Coordination normal.  Psychiatric:        Mood and Affect: Mood normal.        Behavior: Behavior normal.      Imaging: No results found.

## 2022-12-17 ENCOUNTER — Other Ambulatory Visit: Payer: Self-pay | Admitting: Physician Assistant

## 2022-12-20 ENCOUNTER — Ambulatory Visit (INDEPENDENT_AMBULATORY_CARE_PROVIDER_SITE_OTHER): Payer: Medicare HMO

## 2022-12-20 DIAGNOSIS — I5022 Chronic systolic (congestive) heart failure: Secondary | ICD-10-CM | POA: Diagnosis not present

## 2022-12-20 DIAGNOSIS — Z95810 Presence of automatic (implantable) cardiac defibrillator: Secondary | ICD-10-CM | POA: Diagnosis not present

## 2022-12-22 ENCOUNTER — Ambulatory Visit: Payer: Medicare HMO | Admitting: Physical Therapy

## 2022-12-24 NOTE — Progress Notes (Signed)
EPIC Encounter for ICM Monitoring  Patient Name: Alexandra [name] is a 89 y.o. female Date: 12/24/2022 Primary Care Physican: Daisy Floro, MD Primary Cardiologist: Skains/Bensimhon Electrophysiologist: Joycelyn Schmid Pacing:  99%   02/04/2021 Weight: 162 lbs 04/22/2021 Weight: 168 lbs 11/30/2021 Office Weight: 168 lbs 03/09/2022 Weight: 163 lbs 07/28/2022 Weight: 165 lbs 09/03/2022 Weight: 172 lbs 11/17/2022 Office Weight: 165 lbs                                                                 Spoke with patient and heart failure questions reviewed.  Transmission results reviewed.  Pt asymptomatic for fluid accumulation.  Reports feeling well at this time and voices no complaints.             Corvue Thoracic impedance suggesting intermittent days with possible fluid accumulation within the last month.    Prescribed:  Furosemide 40 mg Take one tablet (40 mg) by mouth once every other day. May take extra as needed.   Potassium 10 mEq 1 tablet daily.   Labs: 11/16/2022 Creatinine 1.10, BUN 39, Potassium 4.5, Sodium 142, GFR 47 A complete set of results can be found in Results Review.   Recommendations:  No changes and encouraged to call if experiencing any fluid symptoms.   Follow-up plan: ICM clinic phone appointment on 01/24/2023.  91 day device clinic remote transmission 02/11/2023.     EP/Cardiology Office Visits:  Recall 11/11/2023 with Dr Graciela Husbands.   Recall 11/17/2023 with Dr Gala Romney.   Copy of ICM check sent to Dr. Graciela Husbands.   3 month ICM trend: 12/23/2022.    12-14 Month ICM trend:     Karie Soda, RN 12/24/2022 7:33 AM

## 2022-12-27 ENCOUNTER — Telehealth: Payer: Self-pay | Admitting: Physical Medicine and Rehabilitation

## 2022-12-27 NOTE — Telephone Encounter (Signed)
Patient called and wants to know if he received a medical denial of medical coverage? She wants to know what should she do about it? CB#[phone]

## 2022-12-30 ENCOUNTER — Telehealth: Payer: Self-pay | Admitting: Physical Medicine and Rehabilitation

## 2022-12-30 NOTE — Telephone Encounter (Signed)
Spoke with patient today, states she was unaware prior lumbar epidural steroid injection was denied. She was also unaware that she would be responsible for payment. I encouraged patient to contact her insurance company to discuss further.

## 2023-01-14 NOTE — Telephone Encounter (Signed)
Patient spoke with Aundra Millet who advised her to reach out to her insurance company.

## 2023-01-18 DIAGNOSIS — Z950 Presence of cardiac pacemaker: Secondary | ICD-10-CM | POA: Diagnosis not present

## 2023-01-18 DIAGNOSIS — M170 Bilateral primary osteoarthritis of knee: Secondary | ICD-10-CM | POA: Diagnosis not present

## 2023-01-18 DIAGNOSIS — R29898 Other symptoms and signs involving the musculoskeletal system: Secondary | ICD-10-CM | POA: Diagnosis not present

## 2023-01-24 ENCOUNTER — Ambulatory Visit: Payer: Medicare HMO | Attending: Internal Medicine

## 2023-01-24 DIAGNOSIS — I5022 Chronic systolic (congestive) heart failure: Secondary | ICD-10-CM

## 2023-01-24 DIAGNOSIS — Z95810 Presence of automatic (implantable) cardiac defibrillator: Secondary | ICD-10-CM | POA: Diagnosis not present

## 2023-01-26 NOTE — Progress Notes (Signed)
 EPIC Encounter for ICM Monitoring  Patient Name: Alexandra [name] is a 89 y.o. female Date: 01/26/2023 Primary Care Physican: Okey Carlin Redbird, MD Primary Cardiologist: Skains/Bensimhon Electrophysiologist: Fernande Pore Pacing:  98%   09/03/2022 Weight: 172 lbs 11/17/2022 Office Weight: 165 lbs 01/26/2023 Weight: 166 lbs                                                                 Spoke with patient and heart failure questions reviewed.  Transmission results reviewed.  Pt asymptomatic for fluid accumulation.  Reports feeling well at this time and voices no complaints.             Corvue Thoracic impedance suggesting possible fluid accumulation from 12/26-1/3.    Prescribed:  Furosemide  40 mg Take one tablet (40 mg) by mouth once every other day. May take extra as needed.   Potassium 10 mEq 1 tablet daily.   Labs: 11/16/2022 Creatinine 1.10, BUN 39, Potassium 4.5, Sodium 142, GFR 47 A complete set of results can be found in Results Review.   Recommendations:  No changes and encouraged to call if experiencing any fluid symptoms.   Follow-up plan: ICM clinic phone appointment on 02/28/2023.  91 day device clinic remote transmission 02/11/2023.     EP/Cardiology Office Visits:  Recall 11/11/2023 with Dr Fernande.   Recall 11/17/2023 with Dr Cherrie.   Copy of ICM check sent to Dr. Fernande.   3 month ICM trend: 01/26/2023.    12-14 Month ICM trend:     [name], RN 01/26/2023 1:54 PM

## 2023-01-28 ENCOUNTER — Other Ambulatory Visit (HOSPITAL_COMMUNITY): Payer: Self-pay

## 2023-01-31 ENCOUNTER — Other Ambulatory Visit (HOSPITAL_COMMUNITY): Payer: Self-pay

## 2023-02-01 ENCOUNTER — Other Ambulatory Visit (HOSPITAL_BASED_OUTPATIENT_CLINIC_OR_DEPARTMENT_OTHER): Payer: Self-pay

## 2023-02-01 ENCOUNTER — Other Ambulatory Visit: Payer: Self-pay

## 2023-02-01 ENCOUNTER — Other Ambulatory Visit (HOSPITAL_COMMUNITY): Payer: Self-pay

## 2023-02-01 MED ORDER — FUROSEMIDE 40 MG PO TABS
40.0000 mg | ORAL_TABLET | ORAL | 4 refills | Status: DC
  Filled 2023-02-01: qty 45, 90d supply, fill #0

## 2023-02-01 MED ORDER — LEVOTHYROXINE SODIUM 25 MCG PO TABS
25.0000 ug | ORAL_TABLET | Freq: Every morning | ORAL | 4 refills | Status: DC
Start: 2022-12-13 — End: 2023-03-14
  Filled 2023-02-01 (×4): qty 90, 90d supply, fill #0

## 2023-02-01 MED ORDER — HYDRALAZINE HCL 10 MG PO TABS
10.0000 mg | ORAL_TABLET | Freq: Three times a day (TID) | ORAL | 3 refills | Status: DC
  Filled 2023-02-01: qty 270, 90d supply, fill #0

## 2023-02-01 MED ORDER — LEVOTHYROXINE SODIUM 25 MCG PO TABS
25.0000 ug | ORAL_TABLET | Freq: Every morning | ORAL | 4 refills | Status: DC
  Filled 2023-02-01: qty 90, 90d supply, fill #0
  Filled 2023-02-02: qty 30, 30d supply, fill #0
  Filled 2023-02-25 – 2023-03-01 (×2): qty 30, 30d supply, fill #1
  Filled 2023-03-16 – 2023-03-31 (×4): qty 30, 30d supply, fill #2
  Filled 2023-04-06 – 2023-06-01 (×3): qty 30, 30d supply, fill #3
  Filled 2023-06-27: qty 30, 30d supply, fill #4
  Filled 2023-07-27: qty 30, 30d supply, fill #5
  Filled 2023-08-23: qty 30, 30d supply, fill #6
  Filled 2023-09-19: qty 30, 30d supply, fill #7
  Filled 2023-10-18: qty 30, 30d supply, fill #8
  Filled 2023-11-14: qty 30, 30d supply, fill #9
  Filled 2023-12-13: qty 30, 30d supply, fill #10
  Filled ????-??-??: fill #3

## 2023-02-01 MED ORDER — CITALOPRAM HYDROBROMIDE 10 MG PO TABS
10.0000 mg | ORAL_TABLET | Freq: Every day | ORAL | 4 refills | Status: DC
  Filled 2023-02-01: qty 90, 90d supply, fill #0

## 2023-02-01 MED ORDER — CITALOPRAM HYDROBROMIDE 10 MG PO TABS
10.0000 mg | ORAL_TABLET | Freq: Every day | ORAL | 4 refills | Status: DC
  Filled 2023-02-01: qty 90, 90d supply, fill #0
  Filled 2023-02-02: qty 30, 30d supply, fill #0
  Filled 2023-02-25 – 2023-03-01 (×2): qty 30, 30d supply, fill #1
  Filled 2023-03-16 – 2023-03-31 (×4): qty 30, 30d supply, fill #2
  Filled 2023-04-06 – 2023-05-19 (×2): qty 30, 30d supply, fill #3
  Filled 2023-06-13: qty 30, 30d supply, fill #4
  Filled 2023-07-26: qty 30, 30d supply, fill #5
  Filled 2023-08-18: qty 30, 30d supply, fill #6
  Filled 2023-09-17: qty 30, 30d supply, fill #7
  Filled 2023-10-17: qty 30, 30d supply, fill #8
  Filled 2023-11-12: qty 30, 30d supply, fill #9
  Filled 2023-12-12: qty 30, 30d supply, fill #10
  Filled ????-??-??: fill #3

## 2023-02-01 MED ORDER — POTASSIUM CHLORIDE ER 10 MEQ PO CPCR
10.0000 meq | ORAL_CAPSULE | Freq: Every day | ORAL | 4 refills | Status: DC
  Filled 2023-02-01: qty 90, 90d supply, fill #0
  Filled 2023-02-02: qty 30, 30d supply, fill #0
  Filled 2023-02-25 – 2023-03-01 (×2): qty 30, 30d supply, fill #1
  Filled 2023-03-16 – 2023-03-31 (×4): qty 30, 30d supply, fill #2
  Filled 2023-04-06 – 2023-04-25 (×2): qty 30, 30d supply, fill #3
  Filled 2023-06-01: qty 30, 30d supply, fill #4
  Filled 2023-06-27: qty 30, 30d supply, fill #5
  Filled 2023-07-27: qty 30, 30d supply, fill #6
  Filled 2023-08-23: qty 30, 30d supply, fill #7
  Filled 2023-09-19: qty 30, 30d supply, fill #8
  Filled 2023-10-18: qty 30, 30d supply, fill #9
  Filled 2023-11-14: qty 30, 30d supply, fill #10
  Filled 2023-12-13: qty 30, 30d supply, fill #11
  Filled ????-??-??: fill #3

## 2023-02-01 MED ORDER — SPIRONOLACTONE 25 MG PO TABS
25.0000 mg | ORAL_TABLET | Freq: Every day | ORAL | 4 refills | Status: DC
  Filled 2023-02-01 – 2023-03-29 (×4): qty 90, 90d supply, fill #0
  Filled 2023-07-10: qty 90, 90d supply, fill #1
  Filled 2023-10-02: qty 90, 90d supply, fill #2

## 2023-02-01 MED ORDER — POTASSIUM CHLORIDE CRYS ER 10 MEQ PO TBCR
10.0000 meq | EXTENDED_RELEASE_TABLET | Freq: Every day | ORAL | 4 refills | Status: DC
  Filled 2023-02-01: qty 90, 90d supply, fill #0

## 2023-02-01 MED ORDER — SPIRONOLACTONE 25 MG PO TABS
25.0000 mg | ORAL_TABLET | Freq: Every day | ORAL | 4 refills | Status: DC
  Filled 2023-02-01: qty 90, 90d supply, fill #0
  Filled 2023-02-02: qty 30, 30d supply, fill #0
  Filled 2023-02-25 – 2023-03-01 (×2): qty 30, 30d supply, fill #1

## 2023-02-01 MED ORDER — FUROSEMIDE 40 MG PO TABS
ORAL_TABLET | ORAL | 4 refills | Status: DC
  Filled 2023-02-01: qty 45, 45d supply, fill #0
  Filled 2023-02-02: qty 15, 30d supply, fill #0
  Filled 2023-02-25 – 2023-03-01 (×2): qty 15, 30d supply, fill #1
  Filled 2023-03-16 – 2023-03-31 (×4): qty 15, 30d supply, fill #2
  Filled 2023-04-06 – 2023-06-13 (×2): qty 15, 30d supply, fill #3
  Filled 2023-07-10: qty 15, 30d supply, fill #4
  Filled 2023-08-08: qty 15, 30d supply, fill #5
  Filled 2023-09-06: qty 15, 30d supply, fill #6
  Filled 2023-10-02: qty 15, 30d supply, fill #7
  Filled 2023-10-31: qty 15, 30d supply, fill #8
  Filled 2023-11-29: qty 15, 30d supply, fill #9
  Filled ????-??-??: fill #3

## 2023-02-01 MED ORDER — HYDRALAZINE HCL 10 MG PO TABS
10.0000 mg | ORAL_TABLET | Freq: Three times a day (TID) | ORAL | 3 refills | Status: DC
  Filled 2023-02-01: qty 270, 90d supply, fill #0
  Filled 2023-02-02: qty 90, 30d supply, fill #0
  Filled 2023-02-25 – 2023-03-01 (×2): qty 90, 30d supply, fill #1
  Filled 2023-03-16 – 2023-03-31 (×4): qty 90, 30d supply, fill #2
  Filled 2023-04-06 – 2023-06-13 (×2): qty 90, 30d supply, fill #3
  Filled 2023-07-10: qty 90, 30d supply, fill #4
  Filled 2023-08-08: qty 90, 30d supply, fill #5
  Filled 2023-09-06: qty 90, 30d supply, fill #6
  Filled 2023-10-02: qty 90, 30d supply, fill #7
  Filled 2023-10-31: qty 90, 30d supply, fill #8
  Filled 2023-11-29: qty 90, 30d supply, fill #9
  Filled ????-??-??: fill #3

## 2023-02-02 ENCOUNTER — Other Ambulatory Visit: Payer: Self-pay

## 2023-02-02 ENCOUNTER — Other Ambulatory Visit: Payer: Self-pay | Admitting: Physician Assistant

## 2023-02-02 ENCOUNTER — Other Ambulatory Visit (HOSPITAL_COMMUNITY): Payer: Self-pay | Admitting: Internal Medicine

## 2023-02-02 ENCOUNTER — Telehealth: Payer: Self-pay | Admitting: Physical Medicine and Rehabilitation

## 2023-02-02 MED ORDER — APIXABAN 2.5 MG PO TABS
2.5000 mg | ORAL_TABLET | Freq: Two times a day (BID) | ORAL | 11 refills | Status: DC
  Filled 2023-02-02: qty 60, 30d supply, fill #0
  Filled 2023-02-25 – 2023-03-01 (×2): qty 60, 30d supply, fill #1
  Filled 2023-03-16 – 2023-03-31 (×4): qty 60, 30d supply, fill #2
  Filled 2023-04-06 – 2023-05-19 (×2): qty 60, 30d supply, fill #3
  Filled 2023-06-13: qty 60, 30d supply, fill #4
  Filled 2023-07-26: qty 60, 30d supply, fill #5
  Filled 2023-08-18: qty 60, 30d supply, fill #6
  Filled 2023-09-17: qty 60, 30d supply, fill #7
  Filled 2023-10-17: qty 60, 30d supply, fill #8
  Filled 2023-11-12: qty 60, 30d supply, fill #9
  Filled 2023-12-12: qty 60, 30d supply, fill #10
  Filled 2024-01-08: qty 60, 30d supply, fill #11
  Filled ????-??-??: fill #3

## 2023-02-02 MED ORDER — NITROGLYCERIN 0.4 MG SL SUBL
0.4000 mg | SUBLINGUAL_TABLET | SUBLINGUAL | 9 refills | Status: AC | PRN
  Filled 2023-02-02: qty 25, 1d supply, fill #0

## 2023-02-02 NOTE — Telephone Encounter (Signed)
 Patient's daughter Sherrlyn Dolores called advised her mother is not doing any better and is still in a lot of pain. Sherrlyn Dolores said her mother's left leg out and she almost fall. Sherrlyn Dolores asked what is her mother's next plan of care?  The number to contact Sherrlyn Dolores is [phone]

## 2023-02-03 ENCOUNTER — Other Ambulatory Visit: Payer: Self-pay

## 2023-02-03 ENCOUNTER — Other Ambulatory Visit (HOSPITAL_COMMUNITY): Payer: Self-pay

## 2023-02-08 ENCOUNTER — Other Ambulatory Visit (HOSPITAL_COMMUNITY): Payer: Self-pay

## 2023-02-09 ENCOUNTER — Other Ambulatory Visit: Payer: Self-pay | Admitting: Physical Medicine and Rehabilitation

## 2023-02-09 ENCOUNTER — Telehealth: Payer: Self-pay | Admitting: Orthopaedic Surgery

## 2023-02-09 DIAGNOSIS — M5416 Radiculopathy, lumbar region: Secondary | ICD-10-CM

## 2023-02-09 DIAGNOSIS — R269 Unspecified abnormalities of gait and mobility: Secondary | ICD-10-CM

## 2023-02-09 DIAGNOSIS — M961 Postlaminectomy syndrome, not elsewhere classified: Secondary | ICD-10-CM

## 2023-02-11 ENCOUNTER — Ambulatory Visit (INDEPENDENT_AMBULATORY_CARE_PROVIDER_SITE_OTHER): Payer: Medicare HMO

## 2023-02-11 DIAGNOSIS — I428 Other cardiomyopathies: Secondary | ICD-10-CM

## 2023-02-11 LAB — CUP PACEART REMOTE DEVICE CHECK
Battery Remaining Longevity: 80 mo
Battery Remaining Percentage: 85 %
Battery Voltage: 2.99 V
Brady Statistic AP VP Percent: 1 %
Brady Statistic AP VS Percent: 1 %
Brady Statistic AS VP Percent: 97 %
Brady Statistic AS VS Percent: 1.9 %
Brady Statistic RA Percent Paced: 1 %
Date Time Interrogation Session: 20250124072937
Implantable Lead Connection Status: 753985
Implantable Lead Connection Status: 753985
Implantable Lead Connection Status: 753985
Implantable Lead Implant Date: 20161229
Implantable Lead Implant Date: 20161229
Implantable Lead Implant Date: 20161229
Implantable Lead Location: 753858
Implantable Lead Location: 753859
Implantable Lead Location: 753860
Implantable Pulse Generator Implant Date: 20230728
Lead Channel Impedance Value: 390 Ohm
Lead Channel Impedance Value: 400 Ohm
Lead Channel Impedance Value: 900 Ohm
Lead Channel Pacing Threshold Amplitude: 0.75 V
Lead Channel Pacing Threshold Amplitude: 0.875 V
Lead Channel Pacing Threshold Amplitude: 0.875 V
Lead Channel Pacing Threshold Pulse Width: 0.5 ms
Lead Channel Pacing Threshold Pulse Width: 0.5 ms
Lead Channel Pacing Threshold Pulse Width: 0.5 ms
Lead Channel Sensing Intrinsic Amplitude: 12 mV
Lead Channel Sensing Intrinsic Amplitude: 2.4 mV
Lead Channel Setting Pacing Amplitude: 1.75 V
Lead Channel Setting Pacing Amplitude: 2 V
Lead Channel Setting Pacing Amplitude: 2 V
Lead Channel Setting Pacing Pulse Width: 0.5 ms
Lead Channel Setting Pacing Pulse Width: 0.5 ms
Lead Channel Setting Sensing Sensitivity: 2 mV
Pulse Gen Model: 3562
Pulse Gen Serial Number: 8101642

## 2023-02-15 ENCOUNTER — Encounter: Payer: Self-pay | Admitting: Neurology

## 2023-02-25 ENCOUNTER — Other Ambulatory Visit: Payer: Self-pay

## 2023-02-28 ENCOUNTER — Ambulatory Visit: Payer: HMO | Attending: Internal Medicine

## 2023-02-28 DIAGNOSIS — Z95810 Presence of automatic (implantable) cardiac defibrillator: Secondary | ICD-10-CM | POA: Diagnosis not present

## 2023-02-28 DIAGNOSIS — I5022 Chronic systolic (congestive) heart failure: Secondary | ICD-10-CM | POA: Diagnosis not present

## 2023-03-01 ENCOUNTER — Other Ambulatory Visit: Payer: Self-pay

## 2023-03-01 ENCOUNTER — Other Ambulatory Visit (HOSPITAL_COMMUNITY): Payer: Self-pay

## 2023-03-01 MED ORDER — BUPRENORPHINE 5 MCG/HR TD PTWK
1.0000 | MEDICATED_PATCH | TRANSDERMAL | 0 refills | Status: DC
  Filled 2023-03-01: qty 4, 28d supply, fill #0

## 2023-03-01 MED ORDER — NALOXONE HCL 4 MG/0.1ML NA LIQD
NASAL | 0 refills | Status: DC
  Filled 2023-03-01: qty 2, 90d supply, fill #0
  Filled 2023-06-13: qty 2, 30d supply, fill #0
  Filled 2023-06-14: qty 2, 1d supply, fill #0
  Filled 2023-06-27: qty 2, 30d supply, fill #0
  Filled 2023-06-27: qty 2, 1d supply, fill #0

## 2023-03-02 ENCOUNTER — Other Ambulatory Visit (HOSPITAL_COMMUNITY): Payer: Self-pay

## 2023-03-02 ENCOUNTER — Other Ambulatory Visit: Payer: Self-pay

## 2023-03-02 ENCOUNTER — Telehealth: Payer: Self-pay | Admitting: Internal Medicine

## 2023-03-02 MED ORDER — FAMOTIDINE 20 MG PO TABS
20.0000 mg | ORAL_TABLET | Freq: Every day | ORAL | 3 refills | Status: DC
  Filled 2023-03-02: qty 90, 90d supply, fill #0
  Filled 2023-03-02: qty 30, 30d supply, fill #0
  Filled 2023-03-16 – 2023-03-31 (×4): qty 30, 30d supply, fill #1
  Filled 2023-04-06 – 2023-04-25 (×2): qty 30, 30d supply, fill #2
  Filled 2023-06-01: qty 30, 30d supply, fill #3
  Filled 2023-06-27: qty 30, 30d supply, fill #4
  Filled 2023-07-27: qty 30, 30d supply, fill #5
  Filled ????-??-??: fill #2

## 2023-03-02 NOTE — Telephone Encounter (Signed)
Pt called in asking if Dr. Graciela Husbands will prescribed her pain patch for "pain all throughout her body".  Please advise.  Buprenorphine 5mg 

## 2023-03-02 NOTE — Telephone Encounter (Signed)
Spoke with pt who reports she has been prescribed Buprenorphine patch 48mcg/hr for pain.  She would like to know if she is safe to proceed with applying patch with her prescribed cardiac medications.

## 2023-03-03 ENCOUNTER — Other Ambulatory Visit: Payer: Self-pay

## 2023-03-03 NOTE — Telephone Encounter (Signed)
Spoke with pt and advised of pharmacist recommendations and comments below.  Pt verbalizes understanding and thanked Charity fundraiser for the call back.

## 2023-03-04 ENCOUNTER — Other Ambulatory Visit (HOSPITAL_COMMUNITY): Payer: Self-pay

## 2023-03-04 ENCOUNTER — Telehealth: Payer: Self-pay

## 2023-03-04 NOTE — Telephone Encounter (Signed)
Remote ICM transmission received.  Attempted call to patient regarding ICM remote transmission and line is busy.

## 2023-03-04 NOTE — Progress Notes (Signed)
EPIC Encounter for ICM Monitoring  Patient Name: Alexandra [name] is a 89 y.o. female Date: 03/04/2023 Primary Care Physican: Daisy Floro, MD Primary Cardiologist: Skains/Bensimhon Electrophysiologist: Joycelyn Schmid Pacing:  98%   09/03/2022 Weight: 172 lbs 11/17/2022 Office Weight: 165 lbs 01/26/2023 Weight: 166 lbs     AT/AF Burden  0% (takes Eliquis)                                                             Attempted call to patient and unable to reach.   Transmission results reviewed.          Corvue Thoracic impedance suggesting fluid levels within normal limits within the last month.    Prescribed:  Furosemide 40 mg Take one tablet (40 mg) by mouth once every other day. May take extra as needed.   Potassium 10 mEq 1 tablet daily.   Labs: 11/16/2022 Creatinine 1.10, BUN 39, Potassium 4.5, Sodium 142, GFR 47 A complete set of results can be found in Results Review.   Recommendations:  Unable to reach.     Follow-up plan: ICM clinic phone appointment on 04/04/2023.  91 day device clinic remote transmission 05/13/2023.     EP/Cardiology Office Visits:  Recall 11/11/2023 with Dr Graciela Husbands.   Recall 11/17/2023 with Dr Gala Romney.   Copy of ICM check sent to Dr. Graciela Husbands.   3 month ICM trend: 02/28/2023.    12-14 Month ICM trend:     Karie Soda, RN 03/04/2023 9:47 AM

## 2023-03-07 ENCOUNTER — Other Ambulatory Visit: Payer: Self-pay

## 2023-03-07 ENCOUNTER — Other Ambulatory Visit (HOSPITAL_COMMUNITY): Payer: Self-pay

## 2023-03-07 MED ORDER — MAGNESIUM OXIDE -MG SUPPLEMENT 400 (240 MG) MG PO TABS
200.0000 mg | ORAL_TABLET | Freq: Every day | ORAL | 1 refills | Status: DC
Start: 2023-03-07 — End: 2023-07-12
  Filled 2023-03-07: qty 45, 90d supply, fill #0
  Filled 2023-06-13: qty 45, 90d supply, fill #1

## 2023-03-08 ENCOUNTER — Other Ambulatory Visit (HOSPITAL_COMMUNITY): Payer: Self-pay

## 2023-03-14 ENCOUNTER — Ambulatory Visit: Payer: HMO | Admitting: Neurology

## 2023-03-14 ENCOUNTER — Encounter: Payer: Self-pay | Admitting: Neurology

## 2023-03-14 VITALS — BP 153/62 | HR 77 | Ht 64.0 in | Wt 165.0 lb

## 2023-03-14 DIAGNOSIS — M79604 Pain in right leg: Secondary | ICD-10-CM | POA: Diagnosis not present

## 2023-03-14 DIAGNOSIS — M79605 Pain in left leg: Secondary | ICD-10-CM

## 2023-03-14 NOTE — Progress Notes (Signed)
 [facility] Clinic Note - Initial Visit   Date: 03/14/2023   Alexandra [name] MRN: [id number] DOB: [birth year]   Dear Dr. Tenny Craw:  Thank you for your kind referral of Alexandra [name] for consultation of bilateral leg pain. Although her history is well known to you, please allow [place] to reiterate it for the purpose of our medical record. The patient was accompanied to the clinic by daughter who also provides collateral information.     Alexandra [name] is a 89 y.o. right-handed female with CHF s/p ICD, history of DVT, hypothyroidism, hypertension, CKD, depression presenting for evaluation of bilateral leg pain.   IMPRESSION/PLAN: This is a 89 year-old female referred for evaluation of gait difficulty and leg pain.  On exam, she has remarkably intact strength in the legs, but upon standing and walking, she develops exquisite pain over the distal lateral hamstrings, which radiates up the thigh.  She has pain with palpation over the same area. Although she may have degenerative changes on the lumbar spine with L3-4, L4-5, and L5-S1 foraminal stenosis, her pain is not radicular and did not respond to ESI.  No signs of myelopathy or neurogenic claudication on exam and imaging does not show canal stenosis.  I am concerned that she may have more of a musculoskeletal issue.  I will seek the opinion of Dr. Antoine Primas with Sports Medicine for bilateral leg pain.    ------------------------------------------------------------- History of present illness: Starting in July 2024, she began having left posterior thigh pain, above the knee.  Pain continues to progress to where she now prefers not to walk.  She uses a walker for several years and was previously able to use a cane.  However, since November 2024, she has been much more reliant on using a walker.  She was previously getting ESI by Dr. Alvester Morin which initially helped, however, she is no longer getting relief. CT myelogram  from October 2024 shows multilevel foraminal stenosis, no canal stenosis.  She complains of localized soreness over the back of her thigh from the distal hamstrings up to her hips.  She has not been evaluated by orthopeadics or sports medicine recently.  In the past, she had steroid injection by ortho for her left hip.   Out-side paper records, electronic medical record, and images have been reviewed where available and summarized as:  CT lumbar spine myelogram 10/25/2022: 1. Stable changes of posterior decompression L3-4 and L4-5 with capacious thecal sac. 2. Multilevel spondylosis with foraminal encroachment as above. 3. Chronic mild L4 superior endplate compression deformity.   Lab Results  Component Value Date   HGBA1C 6.4 (H) 12/19/2014   No results found for: "VITAMINB12" Lab Results  Component Value Date   TSH 1.458 08/04/2018   No results found for: "ESRSEDRATE", "POCTSEDRATE"  Past Medical History:  Diagnosis Date   Adult hypothyroidism 04/02/2014   Anemia of chronic disease 08/13/2014   Anxiety    Arthritis    Asthma    breast ca dx'd 2000   left   Bursitis    right hip   CAP (community acquired pneumonia) 12/19/2012   Chest pain    NM stress test 05/2011 Normal   CHF (congestive heart failure) (HCC) 08/19/2014   CKD (chronic kidney disease) stage 3, GFR 30-59 ml/min (HCC) 08/13/2014   Depression    DOE (dyspnea on exertion)    No SOB at rest   Essential (primary) hypertension 04/02/2014   Fatigue    excertional  Headache(784.0)    Hereditary and idiopathic peripheral neuropathy 07/02/2014   Hypercholesteremia    Intermittent LBBB (left bundle branch block) 12/21/2012   Leg weakness    Neuropathy    Obesity    Osteopenia    Peripheral neuropathy    Presence of permanent cardiac pacemaker 01/16/2015   BIV   Sacroiliitis, not elsewhere classified (HCC)    Situational depression    Trigger finger    Dr. Kellie Simmering    Past Surgical History:  Procedure  Laterality Date   ACHILLES TENDON SURGERY     BIV PACEMAKER GENERATOR CHANGEOUT N/A 08/14/2021   Procedure: BIV PACEMAKER GENERATOR CHANGEOUT;  Surgeon: Duke Salvia, MD;  Location: [facility] INVASIVE CV LAB;  Service: Cardiovascular;  Laterality: N/A;   BREAST LUMPECTOMY     breast cancer   CARDIAC CATHETERIZATION N/A 07/21/2015   Procedure: Right Heart Cath;  Surgeon: Dolores [name], MD;  Location: [facility] INVASIVE CV LAB;  Service: Cardiovascular;  Laterality: N/A;   CHOLECYSTECTOMY N/A 08/13/2014   Procedure: LAPAROSCOPIC CHOLECYSTECTOMY;  Surgeon: Almond Lint, MD;  Location: [facility] ORS;  Service: General;  Laterality: N/A;   EP IMPLANTABLE DEVICE N/A 01/16/2015   Procedure: BiV Pacemaker Insertion CRT-P;  Surgeon: Duke Salvia, MD;  Location: [facility] INVASIVE CV LAB;  Service: Cardiovascular;  Laterality: N/A;   LEG SURGERY     LUMBAR LAMINECTOMY     POLYPECTOMY     TONSILLECTOMY     VESICOVAGINAL FISTULA CLOSURE W/ TAH     DC hysterectomy     Medications:  Outpatient Encounter Medications as of 03/14/2023  Medication Sig Note   albuterol (PROVENTIL HFA;VENTOLIN HFA) 108 (90 BASE) MCG/ACT inhaler Inhale 2 puffs into the lungs every 6 (six) hours as needed for wheezing or shortness of breath. Reported on 05/27/2015    ALPRAZolam (XANAX) 0.25 MG tablet Take 0.25 mg by mouth 3 (three) times daily as needed for anxiety. Reported on 05/27/2015    amoxicillin (AMOXIL) 500 MG capsule Take 4 capsules by mouth as needed 1 hour before dental work 08/06/2021: Dental procedure only   apixaban (ELIQUIS) 2.5 MG TABS tablet Take 1 tablet (2.5 mg total) by mouth 2 (two) times daily.    b complex vitamins tablet Take 1 tablet by mouth daily with lunch.     buprenorphine (BUTRANS) 5 MCG/HR PTWK Place 1 patch onto the skin every 7 (seven) days.    calcium carbonate (OSCAL) 1500 (600 Ca) MG TABS tablet Take by mouth 2 (two) times daily with a meal.    calcium carbonate (TUMS - DOSED IN MG ELEMENTAL CALCIUM) 500 MG  chewable tablet Chew 2 tablets by mouth daily as needed for indigestion or heartburn. Reported on 07/18/2015    cholecalciferol (VITAMIN D) 1000 UNITS tablet Take 1,000 Units by mouth daily with lunch.     citalopram (CELEXA) 10 MG tablet Take 10 mg by mouth daily.    famotidine (PEPCID) 20 MG tablet Take 20 mg by mouth daily.    ferrous sulfate 325 (65 FE) MG tablet Take 325 mg by mouth daily with breakfast.    furosemide (LASIX) 40 MG tablet Take one tablet (40 mg) by mouth once every other day. May take extra as needed.    furosemide (LASIX) 40 MG tablet Take 1 tablet (40 mg total) by mouth every other day.    hydrALAZINE (APRESOLINE) 10 MG tablet Take 10 mg by mouth 3 (three) times daily.    hydrALAZINE (APRESOLINE) 10 MG tablet Take 1 tablet (  10 mg total) by mouth 3 (three) times daily with meals.    isosorbide mononitrate (IMDUR) 30 MG 24 hr tablet Take 1 tablet (30 mg total) by mouth daily.    lactose free nutrition (BOOST) LIQD Take 237 mLs by mouth daily with breakfast.     levothyroxine (SYNTHROID) 25 MCG tablet Take 1 tablet (25 mcg total) by mouth every morning on an empty stomach    MAGnesium-Oxide 400 (240 Mg) MG tablet Take 0.5 tablets (200 mg total) by mouth daily.    Multiple Vitamins-Minerals (CENTRUM ADULTS PO) Take by mouth.    nitroGLYCERIN (NITROSTAT) 0.4 MG SL tablet Place 1 tablet (0.4 mg total) under the tongue every 5 (five) minutes as needed for chest pain.    oxyCODONE-acetaminophen (PERCOCET/ROXICET) 5-325 MG tablet Take by mouth every 4 (four) hours as needed for severe pain (pain score 7-10).    potassium chloride (K-DUR) 10 MEQ tablet TAKE 1 TABLET BY MOUTH DAILY    spironolactone (ALDACTONE) 25 MG tablet Take 1 tablet (25 mg total) by mouth daily.    citalopram (CELEXA) 10 MG tablet Take 1 tablet (10 mg total) by mouth daily. (Patient not taking: Reported on 03/14/2023)    citalopram (CELEXA) 10 MG tablet Take 1 tablet (10 mg total) by mouth daily. (Patient not  taking: Reported on 03/14/2023)    famotidine (PEPCID) 20 MG tablet Take 1 tablet (20 mg total) by mouth daily. (Patient not taking: Reported on 03/14/2023)    furosemide (LASIX) 40 MG tablet Take 1 tablet (40 mg total) by mouth every other day. (Patient not taking: Reported on 03/14/2023)    hydrALAZINE (APRESOLINE) 10 MG tablet Take 1 tablet (10 mg total) by mouth 3 (three) times daily with meals. (Patient not taking: Reported on 03/14/2023)    levothyroxine (SYNTHROID) 25 MCG tablet Take 1 tablet (25 mcg total) by mouth every morning on an empty stomach (Patient not taking: Reported on 03/14/2023)    levothyroxine (SYNTHROID, LEVOTHROID) 25 MCG tablet Take 25 mcg by mouth daily before breakfast. for thyroid (Patient not taking: Reported on 03/14/2023)    MAGNESIUM-OXIDE 400 (241.3 Mg) MG tablet TAKE 1/2 TABLET(200 MG) BY MOUTH DAILY (Patient not taking: Reported on 03/14/2023)    Multiple Vitamin (MULITIVITAMIN WITH MINERALS) TABS Take 1 tablet by mouth daily with lunch.  (Patient not taking: Reported on 03/14/2023)    naloxone [facility]) nasal spray 4 mg/0.1 mL Spray the contents of 1 device into 1 nostrill. Call 911. May repeat with 2nd device in alternate nostril if no response in 2-3 min (Patient not taking: Reported on 03/14/2023)    Polyethyl Glycol-Propyl Glycol (SYSTANE OP) Place 1 drop into both eyes 2 (two) times daily. (Patient not taking: Reported on 03/14/2023)    potassium chloride (MICRO-K) 10 MEQ CR capsule Take 1 capsule (10 mEq total) by mouth daily. (Patient not taking: Reported on 03/14/2023)    potassium chloride (KLOR-CON M) 10 MEQ tablet Take 1 tablet (10 mEq total) by mouth daily. (Patient not taking: Reported on 03/14/2023)    spironolactone (ALDACTONE) 25 MG tablet Take 25 mg by mouth daily. (Patient not taking: Reported on 03/14/2023)    spironolactone (ALDACTONE) 25 MG tablet Take 1 tablet (25 mg total) by mouth daily. (Patient not taking: Reported on 03/14/2023)    traMADol (ULTRAM) 50  MG tablet Take 1 tablet (50 mg total) by mouth every 6 (six) hours as needed. (Patient not taking: Reported on 03/14/2023)    No facility-administered encounter medications on file as  of 03/14/2023.    Allergies:  Allergies  Allergen Reactions   Amiodarone Shortness Of Breath and Nausea And Vomiting   Cymbalta [Duloxetine Hcl] Other (See Comments)    Depression   Lyrica [Pregabalin] Other (See Comments)    Blurry vision    Family History: Family History  Problem Relation Age of Onset   Pneumonia Mother 71       cause of death   Diabetes Brother    Pancreatic cancer Father 8   Alzheimer's disease Sister    CVA Daughter        01/21/09   Stroke Daughter    Hypertension Daughter    Heart attack Neg Hx     Social History: Social History   Tobacco Use   Smoking status: Never   Smokeless tobacco: Never  Vaping Use   Vaping status: Never Used  Substance Use Topics   Alcohol use: No    Alcohol/week: 0.0 standard drinks of alcohol   Drug use: No   Social History   Social History Narrative   Lives at home alone.   Right-handed.   Two cups caffeine daily (coffee).         Are you right handed or left handed? Right Handed    Are you currently employed ? No    What is your current occupation? No    Do you live at home alone? Yes   Who lives with you?    What type of home do you live in: 1 story or 2 story? Lives in a two story home, but has everything she needs on the first floor.        Vital Signs:  BP (!) 153/62   Pulse 77   Ht 5\' 4"  (1.626 m)   Wt 165 lb (74.8 kg)   SpO2 97%   BMI 28.32 kg/m     Neurological Exam: MENTAL STATUS including orientation to time, place, person, recent and remote memory, attention span and concentration, language, and fund of knowledge is normal.  Speech is not dysarthric.  CRANIAL NERVES: II:  No visual field defects.     III-IV-VI: Pupils equal round and reactive to light.  Normal conjugate, extra-ocular eye movements in all  directions of gaze.  No nystagmus.  Mild left ptosis at baseline without worsening with sustained upgaze.   V:  Normal facial sensation.    VII:  Mild flattening of the left nasolabial fold.  Symmetric smile. VIII:  Normal hearing and vestibular function.   IX-X:  Normal palatal movement.   XI:  Normal shoulder shrug and head rotation.   XII:  Normal tongue strength and range of motion, no deviation or fasciculation.  MOTOR:  No atrophy, fasciculations or abnormal movements.  No pronator drift.   Upper Extremity:  Right  Left  Deltoid  5/5   5/5   Biceps  5/5   5/5   Triceps  5/5   5/5   Wrist extensors  5/5   5/5   Wrist flexors  5/5   5/5   Finger extensors  5/5   5/5   Finger flexors  5/5   5/5   Dorsal interossei  5/5   5/5   Abductor pollicis  5/5   5/5   Tone (Ashworth scale)  0  0   Lower Extremity:  Right  Left  Hip flexors  5/5   5/5   Hip extensors  5/5   5/5   Adductor 5/5  5/5  Abductor  5/5  5/5  Knee flexors  5/5   5/5   Knee extensors  5/5   5/5   Dorsiflexors  5/5   5/5   Plantarflexors  5/5   5/5   Toe extensors  5/5   5/5   Toe flexors  5/5   5/5   Tone (Ashworth scale)  0  0   MSRs:                                           Right        Left brachioradialis 2+  2+  biceps 2+  2+  triceps 2+  2+  patellar 1+  1+  ankle jerk 0  0  Hoffman no  no  plantar response down  down   SENSORY:  Vibration and temperature reduced below the ankles.   COORDINATION/GAIT: Normal finger-to- nose-finger.  Intact rapid alternating movements bilaterally.  Unable to rise from a chair without using arms.  She is able to walk with a walker but quickly develops soreness and pain behind the knee and distal thigh which limits her ability to continue to walk.   Thank you for allowing me to participate in patient's care.  If I can answer any additional questions, I would be pleased to do so.    Sincerely,    Koleen Celia K. Allena Katz, DO

## 2023-03-16 ENCOUNTER — Other Ambulatory Visit: Payer: Self-pay

## 2023-03-16 ENCOUNTER — Encounter: Payer: Self-pay | Admitting: Internal Medicine

## 2023-03-20 ENCOUNTER — Telehealth: Payer: Self-pay | Admitting: Cardiology

## 2023-03-20 NOTE — Telephone Encounter (Signed)
 Patient's daughter called the answering service today concerned that her mother was having some right arm pain.  I asked patient to describe the pain.  Pain is in her right shoulder and extends to her right elbow.  Pain is worse when she tries to lift or move her arm.  She has also noted that pain is reproducible on palpation.  She has tried taking ibuprofen, which did relieve her right arm pain.     Discussed that while I cannot fully examine her as we are just talking over the phone, her pain sounds musculoskeletal as it has been relieved with ibuprofen, is worse/reproducible with palpation, and is worse when she moves her arm.  Patient denies having any chest pain or pain on the left side of her chest/arm.  Encouraged her to continue use Tylenol and ibuprofen as needed.  Discussed ED precautions.  Jonita Albee, PA-C 03/20/2023 1:47 PM

## 2023-03-22 DIAGNOSIS — M79604 Pain in right leg: Secondary | ICD-10-CM | POA: Diagnosis not present

## 2023-03-22 DIAGNOSIS — M79605 Pain in left leg: Secondary | ICD-10-CM | POA: Diagnosis not present

## 2023-03-22 DIAGNOSIS — G8929 Other chronic pain: Secondary | ICD-10-CM | POA: Diagnosis not present

## 2023-03-23 NOTE — Progress Notes (Signed)
 Remote pacemaker transmission.

## 2023-03-24 ENCOUNTER — Other Ambulatory Visit: Payer: Self-pay

## 2023-03-24 ENCOUNTER — Other Ambulatory Visit (HOSPITAL_COMMUNITY): Payer: Self-pay

## 2023-03-29 ENCOUNTER — Other Ambulatory Visit (HOSPITAL_COMMUNITY): Payer: Self-pay

## 2023-03-29 ENCOUNTER — Other Ambulatory Visit: Payer: Self-pay

## 2023-03-29 NOTE — Progress Notes (Unsigned)
 [facility] [address] [place] [address] Phone: [phone] Subjective:   Morene Antu am a scribe for Dr. Katrinka Blazing.   I'm seeing this patient by the request  of:  Daisy Floro, MD  CC: bilateral leg pain  [id number]  CHEVETTE FEE is a 89 y.o. female coming in with complaint of B leg pain. Low back pain, has had injections by Dr. Alvester Morin.  Seen by neurology as well and further workup did not show true neurogenic claudication.  Patient states leg pain has been going on for about 3 months. Used a pain patch (didn't help), pain medication and strong acetaminophen. Low back pain stated the same time as the legs but is tolerable. No injury to cause the pain. Back surgery about 15 to 20 years ago (disc). Problem makes walking and standing difficult or not able to do at all.      CT 10/24 IMPRESSION: 1. Stable changes of posterior decompression L3-4 and L4-5 with capacious thecal sac. 2. Multilevel spondylosis with foraminal encroachment as above. 3. Chronic mild L4 superior endplate compression deformity.  Past Medical History:  Diagnosis Date   Adult hypothyroidism 04/02/2014   Anemia of chronic disease 08/13/2014   Anxiety    Arthritis    Asthma    breast ca dx'd 2000   left   Bursitis    right hip   CAP (community acquired pneumonia) 12/19/2012   Chest pain    NM stress test 05/2011 Normal   CHF (congestive heart failure) (HCC) 08/19/2014   CKD (chronic kidney disease) stage 3, GFR 30-59 ml/min (HCC) 08/13/2014   Depression    DOE (dyspnea on exertion)    No SOB at rest   Essential (primary) hypertension 04/02/2014   Fatigue    excertional   Headache(784.0)    Hereditary and idiopathic peripheral neuropathy 07/02/2014   Hypercholesteremia    Intermittent LBBB (left bundle branch block) 12/21/2012   Leg weakness    Neuropathy    Obesity    Osteopenia    Peripheral neuropathy    Presence of permanent cardiac  pacemaker 01/16/2015   BIV   Sacroiliitis, not elsewhere classified (HCC)    Situational depression    Trigger finger    Dr. Kellie Simmering   Past Surgical History:  Procedure Laterality Date   ACHILLES TENDON SURGERY     BIV PACEMAKER GENERATOR CHANGEOUT N/A 08/14/2021   Procedure: BIV PACEMAKER GENERATOR CHANGEOUT;  Surgeon: Duke Salvia, MD;  Location: [facility] INVASIVE CV LAB;  Service: Cardiovascular;  Laterality: N/A;   BREAST LUMPECTOMY     breast cancer   CARDIAC CATHETERIZATION N/A 07/21/2015   Procedure: Right Heart Cath;  Surgeon: Dolores [name], MD;  Location: [facility] INVASIVE CV LAB;  Service: Cardiovascular;  Laterality: N/A;   CHOLECYSTECTOMY N/A 08/13/2014   Procedure: LAPAROSCOPIC CHOLECYSTECTOMY;  Surgeon: Almond Lint, MD;  Location: [facility] ORS;  Service: General;  Laterality: N/A;   EP IMPLANTABLE DEVICE N/A 01/16/2015   Procedure: BiV Pacemaker Insertion CRT-P;  Surgeon: Duke Salvia, MD;  Location: [facility] INVASIVE CV LAB;  Service: Cardiovascular;  Laterality: N/A;   LEG SURGERY     LUMBAR LAMINECTOMY     POLYPECTOMY     TONSILLECTOMY     VESICOVAGINAL FISTULA CLOSURE W/ TAH     DC hysterectomy   Social History   Socioeconomic History   Marital status: Widowed    Spouse name: Not on file   Number of children: 5  Years of education: HS   Highest education level: Not on file  Occupational History   Occupation: Retired  Tobacco Use   Smoking status: Never   Smokeless tobacco: Never  Vaping Use   Vaping status: Never Used  Substance and Sexual Activity   Alcohol use: No    Alcohol/week: 0.0 standard drinks of alcohol   Drug use: No   Sexual activity: Never  Other Topics Concern   Not on file  Social History Narrative   Lives at home alone.   Right-handed.   Two cups caffeine daily (coffee).         Are you right handed or left handed? Right Handed    Are you currently employed ? No    What is your current occupation? No    Do you live at home alone? Yes   Who  lives with you?    What type of home do you live in: 1 story or 2 story? Lives in a two story home, but has everything she needs on the first floor.       Social Drivers of Corporate investment banker Strain: Not on file  Food Insecurity: Not on file  Transportation Needs: Not on file  Physical Activity: Not on file  Stress: Not on file  Social Connections: Not on file   Allergies  Allergen Reactions   Amiodarone Shortness Of Breath and Nausea And Vomiting   Cymbalta [Duloxetine Hcl] Other (See Comments)    Depression   Lyrica [Pregabalin] Other (See Comments)    Blurry vision   Family History  Problem Relation Age of Onset   Pneumonia Mother 48       cause of death   Diabetes Brother    Pancreatic cancer Father 44   Alzheimer's disease Sister    CVA Daughter        01/21/09   Stroke Daughter    Hypertension Daughter    Heart attack Neg Hx     Current Outpatient Medications (Endocrine & Metabolic):    levothyroxine (SYNTHROID) 25 MCG tablet, Take 1 tablet (25 mcg total) by mouth every morning on an empty stomach  Current Outpatient Medications (Cardiovascular):    furosemide (LASIX) 40 MG tablet, Take 1 tablet (40 mg total) by mouth every other day.   hydrALAZINE (APRESOLINE) 10 MG tablet, Take 1 tablet (10 mg total) by mouth 3 (three) times daily with meals.   isosorbide mononitrate (IMDUR) 30 MG 24 hr tablet, Take 1 tablet (30 mg total) by mouth daily.   nitroGLYCERIN (NITROSTAT) 0.4 MG SL tablet, Place 1 tablet (0.4 mg total) under the tongue every 5 (five) minutes as needed for chest pain.   spironolactone (ALDACTONE) 25 MG tablet, Take 1 tablet (25 mg total) by mouth daily.  Current Outpatient Medications (Respiratory):    albuterol (PROVENTIL HFA;VENTOLIN HFA) 108 (90 BASE) MCG/ACT inhaler, Inhale 2 puffs into the lungs every 6 (six) hours as needed for wheezing or shortness of breath. Reported on 05/27/2015  Current Outpatient Medications (Analgesics):     oxyCODONE-acetaminophen (PERCOCET/ROXICET) 5-325 MG tablet, Take by mouth every 4 (four) hours as needed for severe pain (pain score 7-10).  Current Outpatient Medications (Hematological):    apixaban (ELIQUIS) 2.5 MG TABS tablet, Take 1 tablet (2.5 mg total) by mouth 2 (two) times daily.   ferrous sulfate 325 (65 FE) MG tablet, Take 325 mg by mouth daily with breakfast.  Current Outpatient Medications (Other):    ALPRAZolam (XANAX) 0.25 MG tablet, Take  0.25 mg by mouth 3 (three) times daily as needed for anxiety. Reported on 05/27/2015   amoxicillin (AMOXIL) 500 MG capsule, Take 4 capsules by mouth as needed 1 hour before dental work   b complex vitamins tablet, Take 1 tablet by mouth daily with lunch.    calcium carbonate (TUMS - DOSED IN MG ELEMENTAL CALCIUM) 500 MG chewable tablet, Chew 2 tablets by mouth daily as needed for indigestion or heartburn. Reported on 07/18/2015   cholecalciferol (VITAMIN D) 1000 UNITS tablet, Take 1,000 Units by mouth daily with lunch.    citalopram (CELEXA) 10 MG tablet, Take 1 tablet (10 mg total) by mouth daily.   famotidine (PEPCID) 20 MG tablet, Take 1 tablet (20 mg total) by mouth daily.   lactose free nutrition (BOOST) LIQD, Take 237 mLs by mouth daily with breakfast.    MAGnesium-Oxide 400 (240 Mg) MG tablet, Take 0.5 tablets (200 mg total) by mouth daily.   Multiple Vitamins-Minerals (CENTRUM ADULTS PO), Take by mouth.   naloxone (NARCAN) nasal spray 4 mg/0.1 mL, Spray the contents of 1 device into 1 nostrill. Call 911. May repeat with 2nd device in alternate nostril if no response in 2-3 min   Polyethyl Glycol-Propyl Glycol (SYSTANE OP), Place 1 drop into both eyes 2 (two) times daily.   potassium chloride (MICRO-K) 10 MEQ CR capsule, Take 1 capsule (10 mEq total) by mouth daily.   Reviewed prior external information including notes and imaging from  primary care provider As well as notes that were available from care everywhere and other healthcare  systems.  Past medical history, social, surgical and family history all reviewed in electronic medical record.  No pertanent information unless stated regarding to the chief complaint.   Review of Systems:  No headache, visual changes, nausea, vomiting, diarrhea, constipation, dizziness, abdominal pain, skin rash, fevers, chills, night sweats, weight loss, swollen lymph nodes, body aches, joint swelling, chest pain, shortness of breath, mood changes. POSITIVE muscle aches  Objective  Blood pressure 120/60, pulse 93, height 5\' 4"  (1.626 m), SpO2 94%.   General: No apparent distress alert and oriented x3 mood and affect normal, dressed appropriately.  HEENT: Pupils equal, extraocular movements intact  Respiratory: Patient's speak in full sentences and does not appear short of breath  Cardiovascular: No lower extremity edema, non tender, no erythema  Leg exam show patient does have some tenderness to palpation diffusely of the thighs.  Does have 4 out of 5 strength and symmetric of the lower extremities but severe arthritic changes with crepitus noted of the knees bilaterally.  Patient is sitting in a wheelchair.  Low back exam show crepitus noted.  Positive Pearlean Brownie.  After informed written and verbal consent, patient was seated on exam table. Right knee was prepped with alcohol swab and utilizing anterolateral approach, patient's right knee space was injected with 4:1  marcaine 0.5%: Kenalog 40mg /dL. Patient tolerated the procedure well without immediate complications.  After informed written and verbal consent, patient was seated on exam table. Left knee was prepped with alcohol swab and utilizing anterolateral approach, patient's left knee space was injected with 4:1  marcaine 0.5%: Kenalog 40mg /dL. Patient tolerated the procedure well without immediate complications.   Impression and Recommendations:    The above documentation has been reviewed and is accurate and complete Judi Saa,  DO

## 2023-03-30 ENCOUNTER — Other Ambulatory Visit: Payer: Self-pay

## 2023-03-30 ENCOUNTER — Encounter: Payer: Self-pay | Admitting: Family Medicine

## 2023-03-30 ENCOUNTER — Ambulatory Visit: Admitting: Family Medicine

## 2023-03-30 VITALS — BP 120/60 | HR 93 | Ht 64.0 in

## 2023-03-30 DIAGNOSIS — M170 Bilateral primary osteoarthritis of knee: Secondary | ICD-10-CM | POA: Diagnosis not present

## 2023-03-30 DIAGNOSIS — M5410 Radiculopathy, site unspecified: Secondary | ICD-10-CM

## 2023-03-30 DIAGNOSIS — M79605 Pain in left leg: Secondary | ICD-10-CM | POA: Diagnosis not present

## 2023-03-30 DIAGNOSIS — M79604 Pain in right leg: Secondary | ICD-10-CM

## 2023-03-30 DIAGNOSIS — R109 Unspecified abdominal pain: Secondary | ICD-10-CM | POA: Diagnosis not present

## 2023-03-30 DIAGNOSIS — M545 Low back pain, unspecified: Secondary | ICD-10-CM | POA: Diagnosis not present

## 2023-03-30 NOTE — Assessment & Plan Note (Signed)
 Severe arthritic changes of the knees bilaterally.  Has been 6 years since any type of intervention done.  Given injections.  Do believe that this is potentially contributing.  Discussed icing regimen and home exercises, which activities to do and which ones to avoid.  Increase activity slowly.  We did discuss aquatic therapy which patient declined.  Could be potentially a candidate for viscosupplementation but thinks it is going to be more likely steroid injections to help her be more active.  Follow-up with me again in 6 to 8 weeks otherwise.

## 2023-03-30 NOTE — Patient Instructions (Addendum)
 Good to see you. Injected bilateral knees today.  CT abdomen.  Epidural L3/L4. [facility].  Return in 6 to 8 weeks.

## 2023-03-30 NOTE — Assessment & Plan Note (Signed)
 Bilateral leg pain that seems to be secondary when patient is ambulating.  This could be more secondary to some of the effusion and severe bone-on-bone arthritic changes of the knees causing some of the discomfort in the leg or still a lumbar radiculopathy and status post decompression of the lumbar spine.  We did discuss different treatment options and would like to try another epidural at L3-L4.  Tried injections in the knees as well.  Differential includes a potential vascular aspect to the legs.  Patient's nerve conduction study did did not show any true lumbar radiculopathy at rest so that is why vascular is within the differential especially with patient's history of CHF and chronic kidney disease.  Does make it highly difficult to treat with patient also being on anticoagulations.  We discussed with her as well as family that this is likely multifactorial and we will need to monitor and see which ones give her the best relief.  Encouraged her to go to continue to be active where she can.  Follow-up with me again 6 to 12 weeks

## 2023-03-31 ENCOUNTER — Other Ambulatory Visit: Payer: Self-pay

## 2023-03-31 ENCOUNTER — Other Ambulatory Visit: Payer: Self-pay | Admitting: Physician Assistant

## 2023-03-31 MED ORDER — ISOSORBIDE MONONITRATE ER 30 MG PO TB24
30.0000 mg | ORAL_TABLET | Freq: Every day | ORAL | 2 refills | Status: DC
  Filled 2023-03-31: qty 30, 30d supply, fill #0
  Filled 2023-03-31: qty 90, 90d supply, fill #0
  Filled 2023-04-06 – 2023-06-01 (×3): qty 30, 30d supply, fill #1
  Filled 2023-06-27: qty 30, 30d supply, fill #2
  Filled 2023-07-27: qty 30, 30d supply, fill #3
  Filled 2023-08-23: qty 30, 30d supply, fill #4
  Filled 2023-09-19: qty 30, 30d supply, fill #5
  Filled 2023-10-18: qty 30, 30d supply, fill #6
  Filled 2023-11-14: qty 30, 30d supply, fill #7
  Filled 2023-12-13: qty 30, 30d supply, fill #8
  Filled ????-??-??: fill #1

## 2023-04-01 ENCOUNTER — Other Ambulatory Visit (HOSPITAL_COMMUNITY): Payer: Self-pay

## 2023-04-01 ENCOUNTER — Other Ambulatory Visit: Payer: Self-pay

## 2023-04-04 ENCOUNTER — Ambulatory Visit: Payer: HMO | Attending: Internal Medicine

## 2023-04-04 ENCOUNTER — Other Ambulatory Visit: Payer: Self-pay

## 2023-04-04 ENCOUNTER — Other Ambulatory Visit (HOSPITAL_COMMUNITY): Payer: Self-pay

## 2023-04-04 DIAGNOSIS — Z95810 Presence of automatic (implantable) cardiac defibrillator: Secondary | ICD-10-CM

## 2023-04-04 DIAGNOSIS — I5022 Chronic systolic (congestive) heart failure: Secondary | ICD-10-CM | POA: Diagnosis not present

## 2023-04-06 ENCOUNTER — Telehealth (HOSPITAL_COMMUNITY): Payer: Self-pay | Admitting: *Deleted

## 2023-04-06 ENCOUNTER — Other Ambulatory Visit: Payer: Self-pay

## 2023-04-06 NOTE — Telephone Encounter (Signed)
  [facility]URE [facility]   Pre-operative Risk Assessment     Request for Surgical Clearance    Procedure:   Lumbar Epidural  Date of Surgery:  Clearance TBD                                 Surgeon:  [facility] or Practice Name:   Phone number:  [phone], [phone] Fax number:  [phone], [phone]   Type of Clearance Requested:   - Pharmacy:  Hold Apixaban (Eliquis) for 6 doses  Fax response to IR and Spine centers  Sent to Dr Gala Romney for review/recommendations  Signed, Meredith Staggers   04/06/2023, 4:32 PM

## 2023-04-06 NOTE — Progress Notes (Signed)
 EPIC Encounter for ICM Monitoring  Patient Name: Alexandra [name] is a 89 y.o. female Date: 04/06/2023 Primary Care Physican: Daisy Floro, MD Primary Cardiologist: Skains/Bensimhon Electrophysiologist: Joycelyn Schmid Pacing:  98%   09/03/2022 Weight: 172 lbs 11/17/2022 Office Weight: 165 lbs 01/26/2023 Weight: 166 lbs   04/06/2023 Weight: 170 lbs    AT/AF Burden  0% (takes Eliquis)                                                             Spoke with patient and heart failure questions reviewed.  Transmission results reviewed.  Pt reports getting injections for knee pain and limiting mobility.   No fluid symptoms in the last month.         Corvue Thoracic impedance suggesting fluid levels within normal limits within the last month.    Prescribed:  Furosemide 40 mg Take one tablet (40 mg) by mouth once every other day. May take extra as needed.   Potassium 10 mEq 1 tablet daily.   Labs: 11/16/2022 Creatinine 1.10, BUN 39, Potassium 4.5, Sodium 142, GFR 47 A complete set of results can be found in Results Review.   Recommendations:  No changes and encouraged to call if experiencing any fluid symptoms.    Follow-up plan: ICM clinic phone appointment on 05/06/2023.  91 day device clinic remote transmission 05/13/2023.     EP/Cardiology Office Visits:  Recall 11/11/2023 with Dr Graciela Husbands.   Recall 11/17/2023 with Dr Gala Romney.   Copy of ICM check sent to Dr. Graciela Husbands.   3 month ICM trend: 04/04/2023.    12-14 Month ICM trend:     Karie Soda, RN 04/06/2023 11:14 AM

## 2023-04-08 NOTE — Telephone Encounter (Signed)
 Faxed to [facility] both IR and Spine center

## 2023-04-12 ENCOUNTER — Other Ambulatory Visit (HOSPITAL_COMMUNITY): Payer: Self-pay

## 2023-04-21 NOTE — Discharge Instructions (Signed)
Post Procedure Spinal Discharge Instruction Sheet  You may resume a regular diet and any medications that you routinely take (including pain medications) unless otherwise noted by MD.  No driving day of procedure.  Light activity throughout the rest of the day.  Do not do any strenuous work, exercise, bending or lifting.  The day following the procedure, you can resume normal physical activity but you should refrain from exercising or physical therapy for at least three days thereafter.  You may apply ice to the injection site, 20 minutes on, 20 minutes off, as needed. Do not apply ice directly to skin.    Common Side Effects:  Headaches- take your usual medications as directed by your physician.  Increase your fluid intake.  Caffeinated beverages may be helpful.  Lie flat in bed until your headache resolves.  Restlessness or inability to sleep- you may have trouble sleeping for the next few days.  Ask your referring physician if you need any medication for sleep.  Facial flushing or redness- should subside within a few days.  Increased pain- a temporary increase in pain a day or two following your procedure is not unusual.  Take your pain medication as prescribed by your referring physician.  Leg cramps  Please contact our office at [phone] for the following symptoms: Fever greater than 100 degrees. Headaches unresolved with medication after 2-3 days. Increased swelling, pain, or redness at injection site.   Thank you for visiting [facility] today.   YOU MAY RESUME YOUR ELIQUIS IN 48 HOURS.

## 2023-04-22 ENCOUNTER — Other Ambulatory Visit: Payer: Self-pay | Admitting: Family Medicine

## 2023-04-22 ENCOUNTER — Ambulatory Visit
Admission: RE | Admit: 2023-04-22 | Discharge: 2023-04-22 | Disposition: A | Discharge status: Home / Self Care | Source: Ambulatory Visit | Attending: Family Medicine | Admitting: Family Medicine

## 2023-04-22 DIAGNOSIS — M79604 Pain in right leg: Secondary | ICD-10-CM

## 2023-04-22 DIAGNOSIS — M545 Low back pain, unspecified: Secondary | ICD-10-CM

## 2023-04-22 DIAGNOSIS — M170 Bilateral primary osteoarthritis of knee: Secondary | ICD-10-CM

## 2023-04-22 DIAGNOSIS — R109 Unspecified abdominal pain: Secondary | ICD-10-CM

## 2023-04-22 DIAGNOSIS — M5410 Radiculopathy, site unspecified: Secondary | ICD-10-CM

## 2023-04-22 DIAGNOSIS — M4727 Other spondylosis with radiculopathy, lumbosacral region: Secondary | ICD-10-CM | POA: Diagnosis not present

## 2023-04-22 MED ORDER — IOPAMIDOL (ISOVUE-M 200) INJECTION 41%
1.0000 mL | Freq: Once | INTRAMUSCULAR | Status: AC
  Administered 2023-04-22: 1 mL via EPIDURAL

## 2023-04-22 MED ORDER — METHYLPREDNISOLONE ACETATE 40 MG/ML INJ SUSP (RADIOLOG
80.0000 mg | Freq: Once | INTRAMUSCULAR | Status: AC
  Administered 2023-04-22: 80 mg via EPIDURAL

## 2023-04-25 ENCOUNTER — Other Ambulatory Visit: Payer: Self-pay

## 2023-04-25 ENCOUNTER — Other Ambulatory Visit (HOSPITAL_COMMUNITY): Payer: Self-pay

## 2023-04-27 ENCOUNTER — Other Ambulatory Visit (HOSPITAL_COMMUNITY): Payer: Self-pay

## 2023-05-06 ENCOUNTER — Ambulatory Visit: Attending: Internal Medicine

## 2023-05-06 DIAGNOSIS — Z95810 Presence of automatic (implantable) cardiac defibrillator: Secondary | ICD-10-CM

## 2023-05-06 DIAGNOSIS — I5022 Chronic systolic (congestive) heart failure: Secondary | ICD-10-CM | POA: Diagnosis not present

## 2023-05-06 NOTE — Progress Notes (Signed)
 EPIC Encounter for ICM Monitoring  Patient Name: Alexandra [name] is a 89 y.o. female Date: 05/06/2023 Primary Care Physican: Okey Carlin Redbird, MD Primary Cardiologist: Skains/Bensimhon Electrophysiologist: Fernande Pore Pacing:  98%   09/03/2022 Weight: 172 lbs 11/17/2022 Office Weight: 165 lbs 01/26/2023 Weight: 166 lbs   04/06/2023 Weight: 170 lbs    AT/AF Burden  0% (takes Eliquis )                                                             Spoke with patient and heart failure questions reviewed.  Transmission results reviewed.  Pt asymptomatic for fluid accumulation.  Reports feeling well at this time and voices no complaints.           Corvue Thoracic impedance suggesting normal fluid levels within the last month.    Prescribed:  Furosemide  40 mg Take one tablet (40 mg) by mouth once every other day. May take extra as needed.   Potassium 10 mEq 1 tablet daily.   Labs: 11/16/2022 Creatinine 1.10, BUN 39, Potassium 4.5, Sodium 142, GFR 47 A complete set of results can be found in Results Review.   Recommendations:  No changes and encouraged to call if experiencing any fluid symptoms.   Follow-up plan: ICM clinic phone appointment on 06/06/2023.  91 day device clinic remote transmission 05/13/2023.     EP/Cardiology Office Visits:  Recall 11/11/2023 with Dr Fernande.   Recall 11/17/2023 with Dr Cherrie.   Copy of ICM check sent to Dr. Fernande.   3 month ICM trend: 05/06/2023.    12-14 Month ICM trend:     [name], RN 05/06/2023 8:16 AM

## 2023-05-11 ENCOUNTER — Ambulatory Visit
Admission: RE | Admit: 2023-05-11 | Discharge: 2023-05-11 | Disposition: A | Discharge status: Home / Self Care | Source: Ambulatory Visit | Attending: Family Medicine | Admitting: Family Medicine

## 2023-05-11 DIAGNOSIS — R109 Unspecified abdominal pain: Secondary | ICD-10-CM

## 2023-05-11 DIAGNOSIS — R1030 Lower abdominal pain, unspecified: Secondary | ICD-10-CM | POA: Diagnosis not present

## 2023-05-11 MED ORDER — IOPAMIDOL (ISOVUE-370) INJECTION 76%
500.0000 mL | Freq: Once | INTRAVENOUS | Status: AC | PRN
  Administered 2023-05-11: 70 mL via INTRAVENOUS

## 2023-05-13 ENCOUNTER — Ambulatory Visit (INDEPENDENT_AMBULATORY_CARE_PROVIDER_SITE_OTHER): Payer: Medicare HMO

## 2023-05-13 DIAGNOSIS — I428 Other cardiomyopathies: Secondary | ICD-10-CM

## 2023-05-13 LAB — CUP PACEART REMOTE DEVICE CHECK
Battery Remaining Longevity: 79 mo
Battery Remaining Percentage: 82 %
Battery Voltage: 2.99 V
Brady Statistic AP VP Percent: 1.1 %
Brady Statistic AP VS Percent: 1 %
Brady Statistic AS VP Percent: 97 %
Brady Statistic AS VS Percent: 1.8 %
Brady Statistic RA Percent Paced: 1 %
Date Time Interrogation Session: 20250425030947
Implantable Lead Connection Status: 753985
Implantable Lead Connection Status: 753985
Implantable Lead Connection Status: 753985
Implantable Lead Implant Date: 20161229
Implantable Lead Implant Date: 20161229
Implantable Lead Implant Date: 20161229
Implantable Lead Location: 753858
Implantable Lead Location: 753859
Implantable Lead Location: 753860
Implantable Pulse Generator Implant Date: 20230728
Lead Channel Impedance Value: 400 Ohm
Lead Channel Impedance Value: 400 Ohm
Lead Channel Impedance Value: 960 Ohm
Lead Channel Pacing Threshold Amplitude: 0.625 V
Lead Channel Pacing Threshold Amplitude: 0.75 V
Lead Channel Pacing Threshold Amplitude: 1 V
Lead Channel Pacing Threshold Pulse Width: 0.5 ms
Lead Channel Pacing Threshold Pulse Width: 0.5 ms
Lead Channel Pacing Threshold Pulse Width: 0.5 ms
Lead Channel Sensing Intrinsic Amplitude: 12 mV
Lead Channel Sensing Intrinsic Amplitude: 2.2 mV
Lead Channel Setting Pacing Amplitude: 1.625
Lead Channel Setting Pacing Amplitude: 2 V
Lead Channel Setting Pacing Amplitude: 2 V
Lead Channel Setting Pacing Pulse Width: 0.5 ms
Lead Channel Setting Pacing Pulse Width: 0.5 ms
Lead Channel Setting Sensing Sensitivity: 2 mV
Pulse Gen Model: 3562
Pulse Gen Serial Number: 8101642

## 2023-05-16 ENCOUNTER — Other Ambulatory Visit: Payer: Self-pay

## 2023-05-16 ENCOUNTER — Other Ambulatory Visit (HOSPITAL_COMMUNITY): Payer: Self-pay

## 2023-05-16 MED ORDER — TRAMADOL HCL 50 MG PO TABS
50.0000 mg | ORAL_TABLET | Freq: Four times a day (QID) | ORAL | 0 refills | Status: DC | PRN
  Filled 2023-05-16 – 2023-07-10 (×2): qty 28, 7d supply, fill #0

## 2023-05-17 ENCOUNTER — Encounter: Payer: Self-pay | Admitting: Family Medicine

## 2023-05-17 NOTE — Progress Notes (Unsigned)
 [facility] [address] [place] [address] Phone: [phone] Subjective:   Alexandra [name], am serving as a scribe for Dr. Ronnell Coins.  I'm seeing this patient by the request  of:  Jimmey Mould, MD  CC: Bilateral leg pain  GNF:[id number]  03/30/2023 Bilateral leg pain that seems to be secondary when patient is ambulating.  This could be more secondary to some of the effusion and severe bone-on-bone arthritic changes of the knees causing some of the discomfort in the leg or still a lumbar radiculopathy and status post decompression of the lumbar spine.  We did discuss different treatment options and would like to try another epidural at L3-L4.  Tried injections in the knees as well.  Differential includes a potential vascular aspect to the legs.  Patient's nerve conduction study did did not show any true lumbar radiculopathy at rest so that is why vascular is within the differential especially with patient's history of CHF and chronic kidney disease.  Does make it highly difficult to treat with patient also being on anticoagulations.  We discussed with her as well as family that this is likely multifactorial and we will need to monitor and see which ones give her the best relief.  Encouraged her to go to continue to be active where she can.  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  Has been 6 years since any type of intervention done.  Given injections.  Do believe that this is potentially contributing.  Discussed icing regimen and home exercises, which activities to do and which ones to avoid.  Increase activity slowly.  We did discuss aquatic therapy which patient declined.  Could be potentially a candidate for viscosupplementation but thinks it is going to be more likely steroid injections to help her be more active.  Follow-up with me again in 6 to 8 weeks otherwise.     Updated  05/18/2023 Alexandra [name] is a 89 y.o. female coming in with complaint of B knee and leg pain. Patient states that her pain is increasing since last visit. Did not have any relief from NR injection. Pain is constant and throughout the entire calf.  Tramadol  not reducing pain.        Past Medical History:  Diagnosis Date   Adult hypothyroidism 04/02/2014   Anemia of chronic disease 08/13/2014   Anxiety    Arthritis    Asthma    breast ca dx'd 2000   left   Bursitis    right hip   CAP (community acquired pneumonia) 12/19/2012   Chest pain    NM stress test 05/2011 Normal   CHF (congestive heart failure) (HCC) 08/19/2014   CKD (chronic kidney disease) stage 3, GFR 30-59 ml/min (HCC) 08/13/2014   Depression    DOE (dyspnea on exertion)    No SOB at rest   Essential (primary) hypertension 04/02/2014   Fatigue    excertional   Headache(784.0)    Hereditary and idiopathic peripheral neuropathy 07/02/2014   Hypercholesteremia    Intermittent LBBB (left bundle branch block) 12/21/2012   Leg weakness    Neuropathy    Obesity    Osteopenia    Peripheral neuropathy    Presence of permanent cardiac pacemaker 01/16/2015   BIV   Sacroiliitis, not elsewhere classified (HCC)    Situational depression    Trigger finger    Dr. Bernadine Briar   Past  Surgical History:  Procedure Laterality Date   ACHILLES TENDON SURGERY     BIV PACEMAKER GENERATOR CHANGEOUT N/A 08/14/2021   Procedure: BIV PACEMAKER GENERATOR CHANGEOUT;  Surgeon: Verona Goodwill, MD;  Location: [facility] INVASIVE CV LAB;  Service: Cardiovascular;  Laterality: N/A;   BREAST LUMPECTOMY     breast cancer   CARDIAC CATHETERIZATION N/A 07/21/2015   Procedure: Right Heart Cath;  Surgeon: Mardell Shade, MD;  Location: [facility] INVASIVE CV LAB;  Service: Cardiovascular;  Laterality: N/A;   CHOLECYSTECTOMY N/A 08/13/2014   Procedure: LAPAROSCOPIC CHOLECYSTECTOMY;  Surgeon: Lockie Rima, MD;  Location: [facility] ORS;  Service: General;  Laterality:  N/A;   EP IMPLANTABLE DEVICE N/A 01/16/2015   Procedure: BiV Pacemaker Insertion CRT-P;  Surgeon: Verona Goodwill, MD;  Location: [facility] INVASIVE CV LAB;  Service: Cardiovascular;  Laterality: N/A;   LEG SURGERY     LUMBAR LAMINECTOMY     POLYPECTOMY     TONSILLECTOMY     VESICOVAGINAL FISTULA CLOSURE W/ TAH     DC hysterectomy   Social History   Socioeconomic History   Marital status: Widowed    Spouse name: Not on file   Number of children: 5   Years of education: HS   Highest education level: Not on file  Occupational History   Occupation: Retired  Tobacco Use   Smoking status: Never   Smokeless tobacco: Never  Vaping Use   Vaping status: Never Used  Substance and Sexual Activity   Alcohol use: No    Alcohol/week: 0.0 standard drinks of alcohol   Drug use: No   Sexual activity: Never  Other Topics Concern   Not on file  Social History Narrative   Lives at home alone.   Right-handed.   Two cups caffeine daily (coffee).         Are you right handed or left handed? Right Handed    Are you currently employed ? No    What is your current occupation? No    Do you live at home alone? Yes   Who lives with you?    What type of home do you live in: 1 story or 2 story? Lives in a two story home, but has everything she needs on the first floor.       Social Drivers of Corporate investment banker Strain: Not on file  Food Insecurity: Not on file  Transportation Needs: Not on file  Physical Activity: Not on file  Stress: Not on file  Social Connections: Not on file   Allergies  Allergen Reactions   Amiodarone  Shortness Of Breath and Nausea And Vomiting   Cymbalta [Duloxetine Hcl] Other (See Comments)    Depression   Lyrica [Pregabalin] Other (See Comments)    Blurry vision   Family History  Problem Relation Age of Onset   Pneumonia Mother 7       cause of death   Diabetes Brother    Pancreatic cancer Father 74   Alzheimer's disease Sister    CVA Daughter         01/21/09   Stroke Daughter    Hypertension Daughter    Heart attack Neg Hx     Current Outpatient Medications (Endocrine & Metabolic):    levothyroxine  (SYNTHROID ) 25 MCG tablet, Take 1 tablet (25 mcg total) by mouth every morning on an empty stomach  Current Outpatient Medications (Cardiovascular):    furosemide  (LASIX ) 40 MG tablet, Take 1 tablet (40 mg total) by mouth every  other day.   hydrALAZINE  (APRESOLINE ) 10 MG tablet, Take 1 tablet (10 mg total) by mouth 3 (three) times daily with meals.   isosorbide  mononitrate (IMDUR ) 30 MG 24 hr tablet, Take 1 tablet (30 mg total) by mouth daily.   nitroGLYCERIN  (NITROSTAT ) 0.4 MG SL tablet, Place 1 tablet (0.4 mg total) under the tongue every 5 (five) minutes as needed for chest pain.   spironolactone  (ALDACTONE ) 25 MG tablet, Take 1 tablet (25 mg total) by mouth daily.  Current Outpatient Medications (Respiratory):    albuterol  (PROVENTIL  HFA;VENTOLIN  HFA) 108 (90 BASE) MCG/ACT inhaler, Inhale 2 puffs into the lungs every 6 (six) hours as needed for wheezing or shortness of breath. Reported on 05/27/2015  Current Outpatient Medications (Analgesics):    oxyCODONE -acetaminophen  (PERCOCET/ROXICET) 5-325 MG tablet, Take by mouth every 4 (four) hours as needed for severe pain (pain score 7-10).   traMADol  (ULTRAM ) 50 MG tablet, Take 1 tablet (50 mg total) by mouth every 6 (six) hours as needed for severe pain (7-10).  Current Outpatient Medications (Hematological):    apixaban  (ELIQUIS ) 2.5 MG TABS tablet, Take 1 tablet (2.5 mg total) by mouth 2 (two) times daily.   ferrous sulfate 325 (65 FE) MG tablet, Take 325 mg by mouth daily with breakfast.  Current Outpatient Medications (Other):    ALPRAZolam  (XANAX ) 0.25 MG tablet, Take 0.25 mg by mouth 3 (three) times daily as needed for anxiety. Reported on 05/27/2015   amoxicillin  (AMOXIL ) 500 MG capsule, Take 4 capsules by mouth as needed 1 hour before dental work   b complex vitamins tablet, Take 1  tablet by mouth daily with lunch.    calcium  carbonate (TUMS - DOSED IN MG ELEMENTAL CALCIUM ) 500 MG chewable tablet, Chew 2 tablets by mouth daily as needed for indigestion or heartburn. Reported on 07/18/2015   cholecalciferol  (VITAMIN D ) 1000 UNITS tablet, Take 1,000 Units by mouth daily with lunch.    citalopram  (CELEXA ) 10 MG tablet, Take 1 tablet (10 mg total) by mouth daily.   famotidine  (PEPCID ) 20 MG tablet, Take 1 tablet (20 mg total) by mouth daily.   lactose free nutrition (BOOST) LIQD, Take 237 mLs by mouth daily with breakfast.    MAGnesium -Oxide 400 (240 Mg) MG tablet, Take 0.5 tablets (200 mg total) by mouth daily.   Multiple Vitamins-Minerals (CENTRUM ADULTS PO), Take by mouth.   naloxone  (NARCAN ) nasal spray 4 mg/0.1 mL, Spray the contents of 1 device into 1 nostrill. Call 911. May repeat with 2nd device in alternate nostril if no response in 2-3 min   Polyethyl Glycol-Propyl Glycol (SYSTANE OP), Place 1 drop into both eyes 2 (two) times daily.   potassium chloride  (MICRO-K ) 10 MEQ CR capsule, Take 1 capsule (10 mEq total) by mouth daily.   Reviewed prior external information including notes and imaging from  primary care provider As well as notes that were available from care everywhere and other healthcare systems.  Past medical history, social, surgical and family history all reviewed in electronic medical record.  No pertanent information unless stated regarding to the chief complaint.   Review of Systems:  No headache, visual changes, nausea, vomiting, diarrhea, constipation, dizziness, abdominal pain, skin rash, fevers, chills, night sweats, weight loss, swollen lymph nodes, body aches, joint swelling, chest pain, shortness of breath, mood changes. POSITIVE muscle aches  Objective  Blood pressure 128/68, pulse 90, height 5\' 4"  (1.626 m), SpO2 94%.   General: No apparent distress alert and oriented x3 mood and affect normal, dressed  appropriately.  HEENT: Pupils equal,  extraocular movements intact  Respiratory: Patient's speak in full sentences and does not appear short of breath  Mild pitting edema of the lower extremities.  Patient states pain is out of proportion with even pain to light sensation in both thighs as well as calves.  Patient will not allow for me to squeeze the calves.  Severe arthritic changes with crepitus noted.  After informed written and verbal consent, patient was seated on exam table. Right knee was prepped with alcohol swab and utilizing anterolateral approach, patient's right knee space was injected with 4:1  marcaine  0.5%: Kenalog 40mg /dL. Patient tolerated the procedure well without immediate complications.  After informed written and verbal consent, patient was seated on exam table. Left knee was prepped with alcohol swab and utilizing anterolateral approach, patient's left knee space was injected with 4:1  marcaine  0.5%: Kenalog 40mg /dL. Patient tolerated the procedure well without immediate complications.   Impression and Recommendations:     The above documentation has been reviewed and is accurate and complete Onita Pfluger M Endre Coutts, DO

## 2023-05-18 ENCOUNTER — Encounter: Payer: Self-pay | Admitting: Family Medicine

## 2023-05-18 ENCOUNTER — Ambulatory Visit: Admitting: Family Medicine

## 2023-05-18 VITALS — BP 128/68 | HR 90 | Ht 64.0 in

## 2023-05-18 DIAGNOSIS — I999 Unspecified disorder of circulatory system: Secondary | ICD-10-CM

## 2023-05-18 DIAGNOSIS — R600 Localized edema: Secondary | ICD-10-CM | POA: Diagnosis not present

## 2023-05-18 DIAGNOSIS — M170 Bilateral primary osteoarthritis of knee: Secondary | ICD-10-CM

## 2023-05-18 NOTE — Patient Instructions (Signed)
 [facility] will call you We will be in touch

## 2023-05-18 NOTE — Assessment & Plan Note (Signed)
 Chronic problem and does make it difficulty secondary to patient's other comorbidities and age.  Patient is not a surgical candidate.  Patient is sitting in a wheelchair today.  Patient is accompanied with son and states that the pain is unrelenting in the legs.  Will get further ABIs to further evaluate.  Difficult to treat with medications secondary to her comorbidities of congestive heart failure and chronic kidney disease.  History of pulmonary embolism so we will recheck to make sure no other lower extremity blood clots could be potentially contributing.  Patient knows of worsening pain would need to seek medical attention immediately.  Patient does not believe the pain is coming from her back at this time.  Total time discussing with patient about different treatment options 31 minutes

## 2023-05-19 ENCOUNTER — Other Ambulatory Visit (HOSPITAL_COMMUNITY): Payer: Self-pay

## 2023-05-19 ENCOUNTER — Other Ambulatory Visit: Payer: Self-pay

## 2023-05-19 ENCOUNTER — Encounter: Payer: Self-pay | Admitting: Family Medicine

## 2023-05-19 ENCOUNTER — Ambulatory Visit (HOSPITAL_COMMUNITY)
Admission: RE | Admit: 2023-05-19 | Discharge: 2023-05-19 | Disposition: A | Discharge status: Home / Self Care | Source: Ambulatory Visit | Attending: Vascular Surgery | Admitting: Vascular Surgery

## 2023-05-19 DIAGNOSIS — R600 Localized edema: Secondary | ICD-10-CM | POA: Diagnosis not present

## 2023-05-19 DIAGNOSIS — I999 Unspecified disorder of circulatory system: Secondary | ICD-10-CM

## 2023-05-20 ENCOUNTER — Encounter: Payer: Self-pay | Admitting: Cardiovascular Disease

## 2023-05-24 ENCOUNTER — Telehealth: Payer: Self-pay | Admitting: Family Medicine

## 2023-05-24 NOTE — Telephone Encounter (Signed)
 Dr. Felipe Horton replying via MyChart.

## 2023-05-24 NOTE — Telephone Encounter (Signed)
 Pt daughter, Obriant called. On DPR, phone number [phone]. Calling for results of ABI and CT, informed Brendel that this info was in MyChart and given results. Pt son was at last visit, so daughter is very unclear on what the plan is. States mom is in pain and the scans seem to have made it worse. Is mom supposed to be rechecking with us ? Is there a plan for pain control? Would a referral to Rheumatology make sense for her arthritis?

## 2023-06-01 ENCOUNTER — Other Ambulatory Visit (HOSPITAL_COMMUNITY): Payer: Self-pay

## 2023-06-02 DIAGNOSIS — M79606 Pain in leg, unspecified: Secondary | ICD-10-CM | POA: Diagnosis not present

## 2023-06-02 DIAGNOSIS — M25569 Pain in unspecified knee: Secondary | ICD-10-CM | POA: Diagnosis not present

## 2023-06-02 DIAGNOSIS — N1831 Chronic kidney disease, stage 3a: Secondary | ICD-10-CM | POA: Diagnosis not present

## 2023-06-02 DIAGNOSIS — Z23 Encounter for immunization: Secondary | ICD-10-CM | POA: Diagnosis not present

## 2023-06-04 ENCOUNTER — Other Ambulatory Visit (HOSPITAL_COMMUNITY): Payer: Self-pay

## 2023-06-06 ENCOUNTER — Encounter

## 2023-06-06 ENCOUNTER — Telehealth: Payer: Self-pay | Admitting: Internal Medicine

## 2023-06-06 NOTE — Telephone Encounter (Signed)
 Patient identification verified by 2 forms.   Called and spoke to patient's daughter Daughter states:  -Pt is in pain -Wants to know can she use TENs device. Since she has a pacemaker.   Interventions: -Get message to Dr. Doyle Generous inbox for review.   She agrees with plan, no questions at this time

## 2023-06-06 NOTE — Telephone Encounter (Signed)
 Patient's daughter is requesting to speak with a nurse in regard to pain the patient is having with their device. Please advise.

## 2023-06-07 ENCOUNTER — Other Ambulatory Visit (HOSPITAL_COMMUNITY): Payer: Self-pay

## 2023-06-07 ENCOUNTER — Encounter (HOSPITAL_COMMUNITY)

## 2023-06-07 ENCOUNTER — Other Ambulatory Visit: Payer: Self-pay

## 2023-06-07 MED ORDER — PREDNISONE 10 MG PO TABS
ORAL_TABLET | ORAL | 0 refills | Status: AC
  Filled 2023-06-07: qty 45, 15d supply, fill #0

## 2023-06-07 NOTE — Progress Notes (Signed)
 No ICM remote transmission received for 06/06/2023 and next ICM transmission scheduled for 06/15/2023.

## 2023-06-08 ENCOUNTER — Ambulatory Visit: Attending: Cardiovascular Disease

## 2023-06-08 DIAGNOSIS — I5022 Chronic systolic (congestive) heart failure: Secondary | ICD-10-CM

## 2023-06-08 DIAGNOSIS — Z95810 Presence of automatic (implantable) cardiac defibrillator: Secondary | ICD-10-CM | POA: Diagnosis not present

## 2023-06-08 NOTE — Telephone Encounter (Signed)
 Returned call to Pt's daughter.  Advised would be ok to use TEN's unit on lower back and knees as needed for pain per review of Pt's chart.  All questions answered.

## 2023-06-08 NOTE — Progress Notes (Signed)
 EPIC Encounter for ICM Monitoring  Patient Name: Alexandra [name] is a 89 y.o. female Date: 06/08/2023 Primary Care Physican: Jimmey Mould, MD Primary Cardiologist: Skains/Bensimhon Electrophysiologist: Mealor Bi-V Pacing:  98%   09/03/2022 Weight: 172 lbs 11/17/2022 Office Weight: 165 lbs 01/26/2023 Weight: 166 lbs   04/06/2023 Weight: 170 lbs    AT/AF Burden  0% (takes Eliquis )                                                             Spoke with patient and heart failure questions reviewed.  Transmission results reviewed.  Pt asymptomatic for fluid accumulation.  She is having a lot of arthritis pain in her legs and having difficulty walking.  She wants to go to her granddaughters wedding next week and may have to use a wheel chair.           Corvue Thoracic impedance suggesting normal fluid levels with the exception of possible fluid from 4/20-5/6.   Prescribed:  Furosemide  40 mg Take one tablet (40 mg) by mouth once every other day. May take extra as needed.   Potassium 10 mEq 1 tablet daily.   Labs: 11/16/2022 Creatinine 1.10, BUN 39, Potassium 4.5, Sodium 142, GFR 47 A complete set of results can be found in Results Review.   Recommendations:  No changes and encouraged to call if experiencing any fluid symptoms.   She would like to make an appointment to have her device checked.  Will have EP scheduling to call her.    Follow-up plan: ICM clinic phone appointment on 08/01/2023.  91 day device clinic remote transmission 08/12/2023.     EP/Cardiology Office Visits:  Recall 11/11/2023 with Dr Rodolfo Clan.   Recall 11/17/2023 with Dr Julane Ny.   Copy of ICM check sent to Dr. Arlester Ladd.   3 month ICM trend: 06/08/2023.    12-14 Month ICM trend:     Almyra Jain, RN 06/08/2023 7:12 AM

## 2023-06-09 ENCOUNTER — Other Ambulatory Visit (HOSPITAL_COMMUNITY): Payer: Self-pay

## 2023-06-14 ENCOUNTER — Other Ambulatory Visit: Payer: Self-pay

## 2023-06-14 ENCOUNTER — Other Ambulatory Visit (HOSPITAL_COMMUNITY): Payer: Self-pay

## 2023-06-15 ENCOUNTER — Other Ambulatory Visit (HOSPITAL_COMMUNITY): Payer: Self-pay

## 2023-06-15 ENCOUNTER — Other Ambulatory Visit: Payer: Self-pay

## 2023-06-15 ENCOUNTER — Encounter

## 2023-06-20 NOTE — Progress Notes (Signed)
 Remote pacemaker transmission.

## 2023-06-27 ENCOUNTER — Other Ambulatory Visit (HOSPITAL_COMMUNITY): Payer: Self-pay

## 2023-06-27 ENCOUNTER — Other Ambulatory Visit: Payer: Self-pay

## 2023-06-28 ENCOUNTER — Other Ambulatory Visit: Payer: Self-pay

## 2023-06-28 NOTE — Progress Notes (Unsigned)
  Electrophysiology Office Note:    Date:  06/29/2023   ID:  KEEGHAN MCINTIRE, DOB 1927-11-19, MRN [id number]  PCP:  Jimmey Mould, MD   [facility] Providers Cardiologist:  Jules Oar, MD Electrophysiologist:  Richardo Chandler, MD     Referring MD: Jimmey Mould, MD   History of Present Illness:    Alexandra [name] is a 89 y.o. female with a medical history significant for Abbott CRT pacemaker, Takotsubo cardiomyopathy, history of left bundle branch block, history of PE/DVT in 2016 referred for arrhythmia and device follow-up.     She has been managed by Dr. Rodolfo Clan previously.  Her Abbott CRT pacemaker was placed in 2016 originally, and she underwent generator change in April 2023.  She presented with cardiomyopathy in July 2016 with an EF of 20%.  She returned in September 2016 with low output heart failure.  During that admission, milrinone  resulted in a tachycardia.  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  Since, her EF normalized by March 2023, but in follow-up October 2023, her EF had declined again.      Today, she reports that she feels well and has no complaints. she has no device related complaints -- no new tenderness, drainage, redness.   EKGs/Labs/Other Studies Reviewed Today:     Echocardiogram:  TTE October 2023 LVEF 40 to 45%.  Lateral wall motion generally noted.     EKG:   EKG Interpretation Date/Time:  Wednesday June 29 2023 13:37:48 EDT Ventricular Rate:  109 PR Interval:  154 QRS Duration:  116 QT Interval:  362 QTC Calculation: 487 R Axis:   -86  Text Interpretation: Atrial-sensed ventricular-paced rhythm When compared with ECG of 16-Nov-2022 14:21, Vent. rate has increased BY  14 BPM Confirmed by Marlane Silver [phone]) on 06/29/2023 1:48:57 PM     Physical Exam:    VS:  BP 122/68   Pulse (!) 109   Ht 5' 4 (1.626 m)   Wt 165 lb (74.8 kg)   SpO2 99%    BMI 28.32 kg/m     Wt Readings from Last 3 Encounters:  06/29/23 165 lb (74.8 kg)  03/14/23 165 lb (74.8 kg)  11/17/22 165 lb 12.8 oz (75.2 kg)     GEN:  Well nourished, well developed in no acute distress CARDIAC: RRR, no murmurs, rubs, gallops RESPIRATORY:  Normal work of breathing MUSCULOSKELETAL: trace edema    ASSESSMENT & PLAN:     Atrial fibrillation History of tachy-induced cardiomyopathy 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 See PaceArt for details  Advanced age Will take a conservative approach    Signed, Efraim Grange, MD  06/29/2023 1:51 PM    [facility]

## 2023-06-29 ENCOUNTER — Ambulatory Visit: Attending: Cardiology | Admitting: Cardiovascular Disease

## 2023-06-29 ENCOUNTER — Encounter: Payer: Self-pay | Admitting: Cardiovascular Disease

## 2023-06-29 VITALS — BP 122/68 | HR 109 | Ht 64.0 in | Wt 165.0 lb

## 2023-06-29 DIAGNOSIS — I10 Essential (primary) hypertension: Secondary | ICD-10-CM

## 2023-06-29 DIAGNOSIS — I5022 Chronic systolic (congestive) heart failure: Secondary | ICD-10-CM | POA: Diagnosis not present

## 2023-06-29 NOTE — Patient Instructions (Signed)

## 2023-06-30 ENCOUNTER — Other Ambulatory Visit (HOSPITAL_BASED_OUTPATIENT_CLINIC_OR_DEPARTMENT_OTHER): Payer: Self-pay

## 2023-06-30 ENCOUNTER — Other Ambulatory Visit (HOSPITAL_COMMUNITY): Payer: Self-pay

## 2023-06-30 ENCOUNTER — Other Ambulatory Visit: Payer: Self-pay

## 2023-07-02 ENCOUNTER — Other Ambulatory Visit (HOSPITAL_COMMUNITY): Payer: Self-pay

## 2023-07-04 ENCOUNTER — Other Ambulatory Visit (HOSPITAL_COMMUNITY): Payer: Self-pay

## 2023-07-04 ENCOUNTER — Other Ambulatory Visit: Payer: Self-pay

## 2023-07-05 ENCOUNTER — Other Ambulatory Visit: Payer: Self-pay

## 2023-07-11 ENCOUNTER — Other Ambulatory Visit (HOSPITAL_COMMUNITY): Payer: Self-pay

## 2023-07-11 ENCOUNTER — Other Ambulatory Visit: Payer: Self-pay

## 2023-07-12 ENCOUNTER — Other Ambulatory Visit (HOSPITAL_COMMUNITY): Payer: Self-pay

## 2023-07-12 MED ORDER — MAGNESIUM OXIDE -MG SUPPLEMENT 400 (240 MG) MG PO TABS
200.0000 mg | ORAL_TABLET | Freq: Every day | ORAL | 1 refills | Status: AC
  Filled 2023-09-06: qty 45, 90d supply, fill #0
  Filled 2023-12-05 – 2024-01-25 (×3): qty 45, 90d supply, fill #1

## 2023-07-20 ENCOUNTER — Other Ambulatory Visit (HOSPITAL_COMMUNITY): Payer: Self-pay

## 2023-07-26 ENCOUNTER — Other Ambulatory Visit (HOSPITAL_COMMUNITY): Payer: Self-pay

## 2023-07-26 ENCOUNTER — Other Ambulatory Visit: Payer: Self-pay

## 2023-07-27 ENCOUNTER — Other Ambulatory Visit (HOSPITAL_COMMUNITY): Payer: Self-pay

## 2023-08-01 ENCOUNTER — Ambulatory Visit: Attending: Cardiovascular Disease

## 2023-08-01 DIAGNOSIS — Z95810 Presence of automatic (implantable) cardiac defibrillator: Secondary | ICD-10-CM

## 2023-08-01 DIAGNOSIS — I5022 Chronic systolic (congestive) heart failure: Secondary | ICD-10-CM

## 2023-08-05 NOTE — Progress Notes (Signed)
 EPIC Encounter for ICM Monitoring  Patient Name: Alexandra [name] is a 89 y.o. female Date: 08/05/2023 Primary Care Physican: Okey Carlin Redbird, MD Primary Cardiologist: Skains/Bensimhon Electrophysiologist: Mealor Bi-V Pacing:  98%   09/03/2022 Weight: 172 lbs 11/17/2022 Office Weight: 165 lbs 01/26/2023 Weight: 166 lbs   04/06/2023 Weight: 170 lbs  08/05/2023 Weight: 170 lbs   AT/AF Burden  0% (takes Eliquis )                                                             Spoke with patient and heart failure questions reviewed.  Transmission results reviewed.  Pt asymptomatic for fluid accumulation.  She has been having intermittent days of SOB starting in June which correlates with decreased impedance.  Her grandson's wedding is in August and she has a plane ticket to fly 8/23.         Corvue Thoracic impedance suggesting normal fluid levels with the exception of possible fluid from 6/19-7/8.   Prescribed:  Furosemide  40 mg Take one tablet (40 mg) by mouth once every other day. May take extra as needed.   Potassium 10 mEq 1 tablet daily.   Labs: 11/16/2022 Creatinine 1.10, BUN 39, Potassium 4.5, Sodium 142, GFR 47 A complete set of results can be found in Results Review.   Recommendations:  Advised to call Dr Remo office if SOB continues and she agreed.     Follow-up plan: ICM clinic phone appointment on 09/05/2023.  91 day device clinic remote transmission 08/12/2023.     EP/Cardiology Office Visits:  Recall 11/11/2023 with Dr Fernande.   10/07/2023 with Dr Cherrie.   Copy of ICM check sent to Dr. Nancey.    3 month ICM trend: 08/01/2023.    12-14 Month ICM trend:     [name], RN 08/05/2023 4:56 PM

## 2023-08-12 ENCOUNTER — Ambulatory Visit (INDEPENDENT_AMBULATORY_CARE_PROVIDER_SITE_OTHER): Payer: Medicare HMO

## 2023-08-12 DIAGNOSIS — I428 Other cardiomyopathies: Secondary | ICD-10-CM

## 2023-08-12 LAB — CUP PACEART REMOTE DEVICE CHECK
Battery Remaining Longevity: 74 mo
Battery Remaining Percentage: 78 %
Battery Voltage: 2.99 V
Brady Statistic AP VP Percent: 1 %
Brady Statistic AP VS Percent: 1 %
Brady Statistic AS VP Percent: 96 %
Brady Statistic AS VS Percent: 3.5 %
Brady Statistic RA Percent Paced: 1 %
Date Time Interrogation Session: 20250725035116
Implantable Lead Connection Status: 753985
Implantable Lead Connection Status: 753985
Implantable Lead Connection Status: 753985
Implantable Lead Implant Date: 20161229
Implantable Lead Implant Date: 20161229
Implantable Lead Implant Date: 20161229
Implantable Lead Location: 753858
Implantable Lead Location: 753859
Implantable Lead Location: 753860
Implantable Pulse Generator Implant Date: 20230728
Lead Channel Impedance Value: 400 Ohm
Lead Channel Impedance Value: 410 Ohm
Lead Channel Impedance Value: 950 Ohm
Lead Channel Pacing Threshold Amplitude: 0.625 V
Lead Channel Pacing Threshold Amplitude: 0.75 V
Lead Channel Pacing Threshold Amplitude: 0.875 V
Lead Channel Pacing Threshold Pulse Width: 0.5 ms
Lead Channel Pacing Threshold Pulse Width: 0.5 ms
Lead Channel Pacing Threshold Pulse Width: 0.5 ms
Lead Channel Sensing Intrinsic Amplitude: 1.6 mV
Lead Channel Sensing Intrinsic Amplitude: 12 mV
Lead Channel Setting Pacing Amplitude: 1.625
Lead Channel Setting Pacing Amplitude: 2 V
Lead Channel Setting Pacing Amplitude: 2 V
Lead Channel Setting Pacing Pulse Width: 0.5 ms
Lead Channel Setting Pacing Pulse Width: 0.5 ms
Lead Channel Setting Sensing Sensitivity: 2 mV
Pulse Gen Model: 3562
Pulse Gen Serial Number: 8101642

## 2023-08-14 ENCOUNTER — Ambulatory Visit: Payer: Self-pay | Admitting: Cardiovascular Disease

## 2023-08-19 DIAGNOSIS — M79642 Pain in left hand: Secondary | ICD-10-CM | POA: Diagnosis not present

## 2023-08-19 DIAGNOSIS — M2560 Stiffness of unspecified joint, not elsewhere classified: Secondary | ICD-10-CM | POA: Diagnosis not present

## 2023-08-19 DIAGNOSIS — M2540 Effusion, unspecified joint: Secondary | ICD-10-CM | POA: Diagnosis not present

## 2023-08-19 DIAGNOSIS — Z6829 Body mass index (BMI) 29.0-29.9, adult: Secondary | ICD-10-CM | POA: Diagnosis not present

## 2023-08-19 DIAGNOSIS — E663 Overweight: Secondary | ICD-10-CM | POA: Diagnosis not present

## 2023-08-19 DIAGNOSIS — M79641 Pain in right hand: Secondary | ICD-10-CM | POA: Diagnosis not present

## 2023-08-19 DIAGNOSIS — M1991 Primary osteoarthritis, unspecified site: Secondary | ICD-10-CM | POA: Diagnosis not present

## 2023-08-20 ENCOUNTER — Other Ambulatory Visit (HOSPITAL_COMMUNITY): Payer: Self-pay

## 2023-08-22 ENCOUNTER — Other Ambulatory Visit (HOSPITAL_COMMUNITY): Payer: Self-pay

## 2023-08-22 ENCOUNTER — Other Ambulatory Visit: Payer: Self-pay

## 2023-08-22 MED ORDER — PREDNISONE 5 MG PO TABS
5.0000 mg | ORAL_TABLET | ORAL | 0 refills | Status: DC
  Filled 2023-08-22 (×2): qty 60, 30d supply, fill #0

## 2023-09-01 ENCOUNTER — Other Ambulatory Visit (HOSPITAL_COMMUNITY): Payer: Self-pay

## 2023-09-01 DIAGNOSIS — M1991 Primary osteoarthritis, unspecified site: Secondary | ICD-10-CM | POA: Diagnosis not present

## 2023-09-01 DIAGNOSIS — M79642 Pain in left hand: Secondary | ICD-10-CM | POA: Diagnosis not present

## 2023-09-01 DIAGNOSIS — M2540 Effusion, unspecified joint: Secondary | ICD-10-CM | POA: Diagnosis not present

## 2023-09-01 DIAGNOSIS — M79641 Pain in right hand: Secondary | ICD-10-CM | POA: Diagnosis not present

## 2023-09-01 DIAGNOSIS — M353 Polymyalgia rheumatica: Secondary | ICD-10-CM | POA: Diagnosis not present

## 2023-09-01 DIAGNOSIS — Z6829 Body mass index (BMI) 29.0-29.9, adult: Secondary | ICD-10-CM | POA: Diagnosis not present

## 2023-09-01 DIAGNOSIS — E663 Overweight: Secondary | ICD-10-CM | POA: Diagnosis not present

## 2023-09-01 DIAGNOSIS — M2560 Stiffness of unspecified joint, not elsewhere classified: Secondary | ICD-10-CM | POA: Diagnosis not present

## 2023-09-01 MED ORDER — PREDNISONE 10 MG PO TABS
ORAL_TABLET | ORAL | 0 refills | Status: DC
  Filled 2023-09-01: qty 140, 56d supply, fill #0

## 2023-09-05 ENCOUNTER — Ambulatory Visit: Attending: Cardiovascular Disease

## 2023-09-05 ENCOUNTER — Other Ambulatory Visit (HOSPITAL_COMMUNITY): Payer: Self-pay

## 2023-09-05 DIAGNOSIS — I5022 Chronic systolic (congestive) heart failure: Secondary | ICD-10-CM | POA: Diagnosis not present

## 2023-09-05 DIAGNOSIS — Z95810 Presence of automatic (implantable) cardiac defibrillator: Secondary | ICD-10-CM

## 2023-09-06 ENCOUNTER — Other Ambulatory Visit: Payer: Self-pay

## 2023-09-06 ENCOUNTER — Other Ambulatory Visit (HOSPITAL_COMMUNITY): Payer: Self-pay

## 2023-09-09 ENCOUNTER — Telehealth: Payer: Self-pay

## 2023-09-09 NOTE — Progress Notes (Signed)
 EPIC Encounter for ICM Monitoring  Patient Name: Alexandra [name] is a 89 y.o. female Date: 09/09/2023 Primary Care Physican: Okey Carlin Redbird, MD Primary Cardiologist: Skains/Bensimhon Electrophysiologist: Mealor Bi-V Pacing:  97%   09/03/2022 Weight: 172 lbs 11/17/2022 Office Weight: 165 lbs 01/26/2023 Weight: 166 lbs   04/06/2023 Weight: 170 lbs  08/05/2023 Weight: 170 lbs   AT/AF Burden  0% (takes Eliquis )                                                             Attempted call to patient and unable to reach.   Transmission results reviewed.          Corvue Thoracic impedance suggesting normal fluid levels with the exception of possible fluid from 7/19-8/12.   Prescribed:  Furosemide  40 mg Take one tablet (40 mg) by mouth once every other day. May take extra as needed.   Potassium 10 mEq 1 tablet daily.   Labs: 11/16/2022 Creatinine 1.10, BUN 39, Potassium 4.5, Sodium 142, GFR 47 A complete set of results can be found in Results Review.   Recommendations:  Unable to reach.      Follow-up plan: ICM clinic phone appointment on 10/10/2023.  91 day device clinic remote transmission 11/11/2023.     EP/Cardiology Office Visits:  Recall 11/11/2023 with Dr Fernande.   10/07/2023 with Dr Cherrie.   Copy of ICM check sent to Dr. Nancey.    3 month ICM trend: 09/05/2023.    12-14 Month ICM trend:     [name], RN 09/09/2023 5:04 PM

## 2023-09-09 NOTE — Telephone Encounter (Signed)
 Remote ICM transmission received.  Attempted call to patient regarding ICM remote transmission and no answer.

## 2023-09-14 ENCOUNTER — Other Ambulatory Visit (HOSPITAL_COMMUNITY): Payer: Self-pay

## 2023-09-15 ENCOUNTER — Other Ambulatory Visit (HOSPITAL_COMMUNITY): Payer: Self-pay

## 2023-09-15 MED ORDER — FAMOTIDINE 20 MG PO TABS
20.0000 mg | ORAL_TABLET | Freq: Every day | ORAL | 3 refills | Status: AC
  Filled 2023-09-15: qty 90, 90d supply, fill #0
  Filled 2023-12-08: qty 90, 90d supply, fill #1

## 2023-09-16 ENCOUNTER — Other Ambulatory Visit: Payer: Self-pay

## 2023-09-29 ENCOUNTER — Other Ambulatory Visit (HOSPITAL_COMMUNITY): Payer: Self-pay

## 2023-09-29 DIAGNOSIS — M179 Osteoarthritis of knee, unspecified: Secondary | ICD-10-CM | POA: Diagnosis not present

## 2023-09-29 DIAGNOSIS — Z6829 Body mass index (BMI) 29.0-29.9, adult: Secondary | ICD-10-CM | POA: Diagnosis not present

## 2023-09-29 DIAGNOSIS — Z23 Encounter for immunization: Secondary | ICD-10-CM | POA: Diagnosis not present

## 2023-09-29 DIAGNOSIS — J4520 Mild intermittent asthma, uncomplicated: Secondary | ICD-10-CM | POA: Diagnosis not present

## 2023-09-29 DIAGNOSIS — W19XXXA Unspecified fall, initial encounter: Secondary | ICD-10-CM | POA: Diagnosis not present

## 2023-09-29 MED ORDER — ALBUTEROL SULFATE HFA 108 (90 BASE) MCG/ACT IN AERS
2.0000 | INHALATION_SPRAY | Freq: Four times a day (QID) | RESPIRATORY_TRACT | 3 refills | Status: DC | PRN
  Filled 2023-09-29: qty 6.7, 25d supply, fill #0

## 2023-10-06 ENCOUNTER — Telehealth (HOSPITAL_COMMUNITY): Payer: Self-pay | Admitting: Internal Medicine

## 2023-10-06 NOTE — Telephone Encounter (Signed)
 Called to confirm/remind patient of their appointment at the [facility] on 10/06/2023.   Appointment:   [] Confirmed  [x] Left mess   [] No answer/No voice mail  [] VM Full/unable to leave message  [] Phone not in service  Patient reminded to bring all medications and/or complete list.  Confirmed patient has transportation. Gave directions, instructed to utilize valet parking.

## 2023-10-07 ENCOUNTER — Ambulatory Visit (HOSPITAL_COMMUNITY)
Admission: RE | Admit: 2023-10-07 | Discharge: 2023-10-07 | Disposition: A | Discharge status: Home / Self Care | Source: Ambulatory Visit | Attending: Internal Medicine | Admitting: Internal Medicine

## 2023-10-07 VITALS — BP 148/72 | HR 103 | Wt 166.8 lb

## 2023-10-07 DIAGNOSIS — N1832 Chronic kidney disease, stage 3b: Secondary | ICD-10-CM | POA: Insufficient documentation

## 2023-10-07 DIAGNOSIS — I5032 Chronic diastolic (congestive) heart failure: Secondary | ICD-10-CM | POA: Insufficient documentation

## 2023-10-07 DIAGNOSIS — I447 Left bundle-branch block, unspecified: Secondary | ICD-10-CM | POA: Diagnosis not present

## 2023-10-07 DIAGNOSIS — E039 Hypothyroidism, unspecified: Secondary | ICD-10-CM | POA: Insufficient documentation

## 2023-10-07 DIAGNOSIS — Z86718 Personal history of other venous thrombosis and embolism: Secondary | ICD-10-CM | POA: Insufficient documentation

## 2023-10-07 DIAGNOSIS — I130 Hypertensive heart and chronic kidney disease with heart failure and stage 1 through stage 4 chronic kidney disease, or unspecified chronic kidney disease: Secondary | ICD-10-CM | POA: Diagnosis not present

## 2023-10-07 DIAGNOSIS — M1990 Unspecified osteoarthritis, unspecified site: Secondary | ICD-10-CM | POA: Diagnosis not present

## 2023-10-07 DIAGNOSIS — I5022 Chronic systolic (congestive) heart failure: Secondary | ICD-10-CM

## 2023-10-07 DIAGNOSIS — R0602 Shortness of breath: Secondary | ICD-10-CM | POA: Insufficient documentation

## 2023-10-07 DIAGNOSIS — Z95810 Presence of automatic (implantable) cardiac defibrillator: Secondary | ICD-10-CM | POA: Insufficient documentation

## 2023-10-07 DIAGNOSIS — Z79899 Other long term (current) drug therapy: Secondary | ICD-10-CM | POA: Diagnosis not present

## 2023-10-07 DIAGNOSIS — Z7901 Long term (current) use of anticoagulants: Secondary | ICD-10-CM | POA: Diagnosis not present

## 2023-10-07 DIAGNOSIS — I480 Paroxysmal atrial fibrillation: Secondary | ICD-10-CM | POA: Diagnosis not present

## 2023-10-07 DIAGNOSIS — I10 Essential (primary) hypertension: Secondary | ICD-10-CM | POA: Diagnosis not present

## 2023-10-07 NOTE — Patient Instructions (Signed)
 Medication Changes:  TAKE AN EXTRA FUROSEMIDE  THE NEXT 2 DAYS   Follow-Up in: 12 MONTHS PLEASE CALL OUR OFFICE AROUND AUGUST OF 2025 TO GET SCHEDULED FOR YOUR APPOINTMENT. PHONE NUMBER IS [phone] OPTION 2   At the [facility], you and your health needs are our priority. We have a designated team specialized in the treatment of Heart Failure. This Care Team includes your primary Heart Failure Specialized Cardiologist (physician), Advanced Practice Providers (APPs- Physician Assistants and Nurse Practitioners), and Pharmacist who all work together to provide you with the care you need, when you need it.   You may see any of the following providers on your designated Care Team at your next follow up:  Dr. Toribio Fuel Dr. Ezra Shuck Dr. Ria Commander Dr. Odis Brownie Greig Mosses, NP [place]affie Shed, [place] [facility] [place], [place] Beckey Coe, NP [place] Lee, NP Tinnie Redman, PharmD   Please be sure to bring in all your medications bottles to every appointment.   Need to Contact Us :  If you have any questions or concerns before your next appointment please send us  a message through [place] or call our office at [phone].    TO LEAVE A MESSAGE FOR THE NURSE SELECT OPTION 2, PLEASE LEAVE A MESSAGE INCLUDING: YOUR NAME DATE OF BIRTH CALL BACK NUMBER REASON FOR CALL**this is important as we prioritize the call backs  YOU WILL RECEIVE A CALL BACK THE SAME DAY AS LONG AS YOU CALL BEFORE 4:00 PM

## 2023-10-07 NOTE — Addendum Note (Signed)
 Encounter addended by: Ashli Selders B, RN on: 10/07/2023 12:46 PM  Actions taken: Clinical Note Signed

## 2023-10-07 NOTE — Progress Notes (Signed)
 Patient ID: Alexandra [name], female   DOB: Dec 30, 1927, 89 y.o.   MRN: [id number]     Advanced Heart Failure Clinic Note   Primary Care: Dr. Dale Gull Primary Cardiologist: Dr Oneil Parchment Primary HF: Dr Cherrie  HPI: Alexandra [name] is a 89 y.o. female  with chronic systolic CHF Echo 10/02/14 EF 15% with diffuse hypokinesis initially thought to be takatsubos CMP, HX of PE/DVT 08/2014 on Xarelto , CKD stage 3, HTN, hx of LBBB, and hypothyroidism   She was admitted in July 2016 for Abdominal pain and SOB. Her Gallbladder was thought to be causing her pain, so a lap choley was performed that admission. Echo on 08/15/14 showed EF 20%. There were thoughts that she may have stress induced CMP due to the death of her husband in 05/27/2023 after several months of hospice care.    She was directly admitted to [facility] on 10/11/14 with concerns for low output with SBPs in 80s and tachycardia.  PICC line was placed with initial co-ox of 61%.CVP was 15. Milrinone  started when co-ox dropped to 53%. She had SVT on milrinone  0.25 so started on amio. Developed acute SOB that improved quickly with cessation of amio and extra dose of IV lasix . Felt to possibly have acute amio toxicity. Milrinone  decreased to 0.125 and no further PSVT. Sent home on milrinone  0.125. Diuresed well on 80 mg lasix  IB BID. Discharge weight was 170.  Admitted 12/1 through 12/6 with PICC line infection. Bcx with [facility] 2/2 with for diptheroids. Bcx 1/2 at [facility]-Bacteremia-cornybacterium . PICC line replaced. She continued on milrinone  0.125 mcg.   S/p CRT-D placement 01/16/15 with Dr Fernande.   Echo 2/17 EF ~40% Milrinone  stopped 02/2015. Has had no major issues since.    Echo 7/20 EF 55%  Echo 10/23 EF 55-60%   Here with her son for yearly f/u. For past few weeks has been on a prednisone  taper for severe arthitis. Feels mor SOB. No edema, orthopnea, PND   ICD interrogated personally: Fluid was ok. Ok now. 97 Bivpacing No AF/VT.  Personally  reviewed    RHC 7/17 RA = 1 RV = 25/2 PA = 29/9 (15) PCW = 6 Fick cardiac output/index = 5.9/3.3 PVR = 1.5 WU Ao sat = 98% PA sat = 68%, 70%, 71%  ECHO 10/02/14 LVEF 15% with diffuse hypokinesis, RV mildly dilated, normal function, PA peak pressure 59 mmHg Echo 2/17 EF 40-45% (I fetlt 35%)   Review of systems complete and found to be negative unless listed in HPI.   Past Medical History:  Diagnosis Date   Adult hypothyroidism 04/02/2014   Anemia of chronic disease 08/13/2014   Anxiety    Arthritis    Asthma    breast ca dx'd 2000   left   Bursitis    right hip   CAP (community acquired pneumonia) 12/19/2012   Chest pain    NM stress test 05/2011 Normal   CHF (congestive heart failure) (HCC) 08/19/2014   CKD (chronic kidney disease) stage 3, GFR 30-59 ml/min (HCC) 08/13/2014   Depression    DOE (dyspnea on exertion)    No SOB at rest   Essential (primary) hypertension 04/02/2014   Fatigue    excertional   Headache(784.0)    Hereditary and idiopathic peripheral neuropathy 07/02/2014   Hypercholesteremia    Intermittent LBBB (left bundle branch block) 12/21/2012   Leg weakness    Neuropathy    Obesity    Osteopenia    Peripheral neuropathy  Presence of permanent cardiac pacemaker 01/16/2015   BIV   Sacroiliitis, not elsewhere classified (HCC)    Situational depression    Trigger finger    Dr. Everlean    Current Outpatient Medications  Medication Sig Dispense Refill   albuterol  (PROVENTIL  HFA;VENTOLIN  HFA) 108 (90 BASE) MCG/ACT inhaler Inhale 2 puffs into the lungs every 6 (six) hours as needed for wheezing or shortness of breath. Reported on 05/27/2015     ALPRAZolam  (XANAX ) 0.25 MG tablet Take 0.25 mg by mouth 3 (three) times daily as needed for anxiety. Reported on 05/27/2015     amoxicillin  (AMOXIL ) 500 MG capsule Take 4 capsules by mouth as needed 1 hour before dental work 4 capsule prn   apixaban  (ELIQUIS ) 2.5 MG TABS tablet Take 1 tablet (2.5 mg total) by  mouth 2 (two) times daily. 60 tablet 11   b complex vitamins tablet Take 1 tablet by mouth daily with lunch.      calcium  carbonate (TUMS - DOSED IN MG ELEMENTAL CALCIUM ) 500 MG chewable tablet Chew 2 tablets by mouth daily as needed for indigestion or heartburn. Reported on 07/18/2015     cholecalciferol  (VITAMIN D ) 1000 UNITS tablet Take 1,000 Units by mouth daily with lunch.      citalopram  (CELEXA ) 10 MG tablet Take 1 tablet (10 mg total) by mouth daily. 90 tablet 4   famotidine  (PEPCID ) 20 MG tablet Take 1 tablet (20 mg total) by mouth daily. 90 tablet 3   ferrous sulfate 325 (65 FE) MG tablet Take 325 mg by mouth daily with breakfast.     furosemide  (LASIX ) 40 MG tablet Take 1 tablet (40 mg total) by mouth every other day. 45 tablet 4   hydrALAZINE  (APRESOLINE ) 10 MG tablet Take 1 tablet (10 mg total) by mouth 3 (three) times daily with meals. 270 tablet 3   isosorbide  mononitrate (IMDUR ) 30 MG 24 hr tablet Take 1 tablet (30 mg total) by mouth daily. 90 tablet 2   lactose free nutrition (BOOST) LIQD Take 237 mLs by mouth daily with breakfast.      levothyroxine  (SYNTHROID ) 25 MCG tablet Take 1 tablet (25 mcg total) by mouth every morning on an empty stomach 90 tablet 4   magnesium  oxide (MAG-OX) 400 (240 Mg) MG tablet Take 0.5 tablets (200 mg total) by mouth daily. 45 tablet 1   Multiple Vitamins-Minerals (CENTRUM ADULTS PO) Take by mouth.     nitroGLYCERIN  (NITROSTAT ) 0.4 MG SL tablet Place 1 tablet (0.4 mg total) under the tongue every 5 (five) minutes as needed for chest pain. 25 tablet 9   oxyCODONE -acetaminophen  (PERCOCET/ROXICET) 5-325 MG tablet Take by mouth every 4 (four) hours as needed for severe pain (pain score 7-10).     Polyethyl Glycol-Propyl Glycol (SYSTANE OP) Place 1 drop into both eyes 2 (two) times daily.     potassium chloride  (MICRO-K ) 10 MEQ CR capsule Take 1 capsule (10 mEq total) by mouth daily. 90 capsule 4   predniSONE  (DELTASONE ) 10 MG tablet Take 4 tablets once a  day for 7 days, 3.5 tablets daily for 7 days, 3 tabs daily for 7 days, 2.5 tabs daily for 7 days, 2 tabs daily for 14 days, 1.5 tabs daily for 14 days 140 tablet 0   spironolactone  (ALDACTONE ) 25 MG tablet Take 1 tablet (25 mg total) by mouth daily. 90 tablet 4   traMADol  (ULTRAM ) 50 MG tablet Take 1 tablet (50 mg total) by mouth every 6 (six) hours as needed for severe  pain (7-10). 28 tablet 0   No current facility-administered medications for this encounter.    Allergies  Allergen Reactions   Amiodarone  Shortness Of Breath and Nausea And Vomiting   Cymbalta [Duloxetine Hcl] Other (See Comments)    Depression   Lyrica [Pregabalin] Other (See Comments)    Blurry vision      Social History   Socioeconomic History   Marital status: Widowed    Spouse name: Not on file   Number of children: 5   Years of education: HS   Highest education level: Not on file  Occupational History   Occupation: Retired  Tobacco Use   Smoking status: Never   Smokeless tobacco: Never  Vaping Use   Vaping status: Never Used  Substance and Sexual Activity   Alcohol use: No    Alcohol/week: 0.0 standard drinks of alcohol   Drug use: No   Sexual activity: Never  Other Topics Concern   Not on file  Social History Narrative   Lives at home alone.   Right-handed.   Two cups caffeine daily (coffee).         Are you right handed or left handed? Right Handed    Are you currently employed ? No    What is your current occupation? No    Do you live at home alone? Yes   Who lives with you?    What type of home do you live in: 1 story or 2 story? Lives in a two story home, but has everything she needs on the first floor.       Social Drivers of Corporate investment banker Strain: Not on file  Food Insecurity: Not on file  Transportation Needs: Not on file  Physical Activity: Not on file  Stress: Not on file  Social Connections: Not on file  Intimate Partner Violence: Not on file      Family  History  Problem Relation Age of Onset   Pneumonia Mother 78       cause of death   Diabetes Brother    Pancreatic cancer Father 65   Alzheimer's disease Sister    CVA Daughter        01/21/09   Stroke Daughter    Hypertension Daughter    Heart attack Neg Hx     Vitals:   10/07/23 1159  BP: (!) 148/72  Pulse: (!) 103  SpO2: 95%  Weight: 75.7 kg (166 lb 12.8 oz)   Wt Readings from Last 3 Encounters:  10/07/23 75.7 kg (166 lb 12.8 oz)  06/29/23 74.8 kg (165 lb)  03/14/23 74.8 kg (165 lb)    PHYSICAL EXAM: General:  Elderly. No resp difficulty HEENT: normal Neck: supple. no JVD. Carotids 2+ bilat; no bruits. No lymphadenopathy or thryomegaly appreciated. Cor: PMI nondisplaced. Regular rate & rhythm. No rubs, gallops or murmurs. Lungs: clear Abdomen: soft, nontender, nondistended. No hepatosplenomegaly. No bruits or masses. Good bowel sounds. Extremities: no cyanosis, clubbing, rash, edema Neuro: alert & orientedx3, cranial nerves grossly intact. moves all 4 extremities w/o difficulty. Affect pleasant  ASSESSMENT & PLAN:  1. Chronic Systolic Heart failure with recovered EF - Echo 10/02/14 EF 15% Echo 2/17 read as 40-45% reviewed probably closer to 35%. Off milrinone  since 2017. s/p St Jude CRT-D placement. 01/16/15 - Echo 11/2016 LVEF 45-50%, Grade 1 DD, Moderate AI - Echo 7/20 EF 55% (completely recovered with CRT) - Echo 03/24/21. EF 55-60% Personally reviewed - Stable NYHA II Mostly limited by arthritis -  Volume ok on device but more SOB in setting of prednisone . Can take extra lasix  for a few days (daily instead of every other day for 2 days) - ICD interrogated today as above - Continue hydralazine  25 mg BID - Continue spiro 12.5 mg daily - Previously failed b-blocker. - No ACE due to CKD.  2. H/o PE/ DVT bilateral by US  08/20/2014 - Continue apixaban  2.5 bid for long-term prevention of DVT per Amplify EXT trial. No bleeding 3. CKD Stage 3b - Last creatinine 1.1 in  1029/24  - Follows with Dr. Gearline  4. Hx of LBBB - s/p CRT-D.97% bivpacing on device. Follows with EP  5. Hypertension  - BP slightly elevated will not be overly aggressive given age    Toribio Fuel, MD  12:33 PM

## 2023-10-07 NOTE — Progress Notes (Signed)
 ICM remote transmission rescheduled for 10/31/2023.

## 2023-10-10 ENCOUNTER — Encounter

## 2023-10-19 ENCOUNTER — Other Ambulatory Visit: Payer: Self-pay

## 2023-10-19 ENCOUNTER — Other Ambulatory Visit (HOSPITAL_COMMUNITY): Payer: Self-pay

## 2023-10-19 DIAGNOSIS — M1991 Primary osteoarthritis, unspecified site: Secondary | ICD-10-CM | POA: Diagnosis not present

## 2023-10-19 DIAGNOSIS — E663 Overweight: Secondary | ICD-10-CM | POA: Diagnosis not present

## 2023-10-19 DIAGNOSIS — M1120 Other chondrocalcinosis, unspecified site: Secondary | ICD-10-CM | POA: Diagnosis not present

## 2023-10-19 DIAGNOSIS — M353 Polymyalgia rheumatica: Secondary | ICD-10-CM | POA: Diagnosis not present

## 2023-10-19 DIAGNOSIS — M79642 Pain in left hand: Secondary | ICD-10-CM | POA: Diagnosis not present

## 2023-10-19 DIAGNOSIS — Z6829 Body mass index (BMI) 29.0-29.9, adult: Secondary | ICD-10-CM | POA: Diagnosis not present

## 2023-10-19 DIAGNOSIS — M79641 Pain in right hand: Secondary | ICD-10-CM | POA: Diagnosis not present

## 2023-10-19 MED ORDER — PREDNISONE 5 MG PO TABS
ORAL_TABLET | ORAL | 0 refills | Status: AC
  Filled 2023-10-19: qty 140, 98d supply, fill #0

## 2023-10-20 NOTE — Progress Notes (Signed)
 Remote PPM Transmission

## 2023-10-24 ENCOUNTER — Encounter: Payer: Self-pay | Admitting: Internal Medicine

## 2023-10-26 DIAGNOSIS — G629 Polyneuropathy, unspecified: Secondary | ICD-10-CM | POA: Diagnosis not present

## 2023-10-26 DIAGNOSIS — Z9181 History of falling: Secondary | ICD-10-CM | POA: Diagnosis not present

## 2023-10-26 DIAGNOSIS — E7800 Pure hypercholesterolemia, unspecified: Secondary | ICD-10-CM | POA: Diagnosis not present

## 2023-10-26 DIAGNOSIS — I509 Heart failure, unspecified: Secondary | ICD-10-CM | POA: Diagnosis not present

## 2023-10-26 DIAGNOSIS — M810 Age-related osteoporosis without current pathological fracture: Secondary | ICD-10-CM | POA: Diagnosis not present

## 2023-10-26 DIAGNOSIS — Z853 Personal history of malignant neoplasm of breast: Secondary | ICD-10-CM | POA: Diagnosis not present

## 2023-10-26 DIAGNOSIS — J4520 Mild intermittent asthma, uncomplicated: Secondary | ICD-10-CM | POA: Diagnosis not present

## 2023-10-26 DIAGNOSIS — M1712 Unilateral primary osteoarthritis, left knee: Secondary | ICD-10-CM | POA: Diagnosis not present

## 2023-10-26 DIAGNOSIS — Z950 Presence of cardiac pacemaker: Secondary | ICD-10-CM | POA: Diagnosis not present

## 2023-10-26 DIAGNOSIS — Z7901 Long term (current) use of anticoagulants: Secondary | ICD-10-CM | POA: Diagnosis not present

## 2023-10-31 ENCOUNTER — Ambulatory Visit: Attending: Cardiovascular Disease

## 2023-10-31 DIAGNOSIS — I5022 Chronic systolic (congestive) heart failure: Secondary | ICD-10-CM

## 2023-10-31 DIAGNOSIS — Z95810 Presence of automatic (implantable) cardiac defibrillator: Secondary | ICD-10-CM

## 2023-11-02 ENCOUNTER — Telehealth: Payer: Self-pay

## 2023-11-02 NOTE — Telephone Encounter (Signed)
 Remote ICM transmission received.  Attempted call to patient regarding ICM remote transmission and no answer.

## 2023-11-02 NOTE — Progress Notes (Signed)
 EPIC Encounter for ICM Monitoring  Patient Name: Alexandra [name] is a 89 y.o. female Date: 11/02/2023 Primary Care Physican: Okey Carlin Redbird, MD Primary Cardiologist: Skains/Bensimhon Electrophysiologist: Mealor Bi-V Pacing:  96%   09/03/2022 Weight: 172 lbs 11/17/2022 Office Weight: 165 lbs 01/26/2023 Weight: 166 lbs   04/06/2023 Weight: 170 lbs  08/05/2023 Weight: 170 lbs 10/06/2024 Office Weight: 166.12 lbs   AT/AF Burden  0% (takes Eliquis )                                                             Attempted call to patient and unable to reach.    Transmission results reviewed.          Since 09/05/2023 ICM remote transmission: [facility] Thoracic impedance suggesting normal fluid levels with the exception of possible fluid from 10/14/2023-10/24/2023.   Prescribed:  Furosemide  40 mg Take one tablet (40 mg) by mouth once every other day. May take extra as needed.   Potassium 10 mEq 1 tablet daily.   Labs: 11/16/2022 Creatinine 1.10, BUN 39, Potassium 4.5, Sodium 142, GFR 47 A complete set of results can be found in Results Review.   Recommendations:   Unable to reach.     Follow-up plan: ICM clinic phone appointment on 12/05/2023.  91 day device clinic remote transmission 11/11/2023.     EP/Cardiology Office Visits:  Recall 06/23/2024 with Jodie Passey, PA.   Recall 10/06/2024 with Dr Cherrie.   Copy of ICM check sent to Dr. Nancey.      Remote monitoring is medically necessary for Heart Failure Management.    Daily Thoracic Impedance ICM trend: 08/04/2023 through 11/02/2023.    12-14 Month Thoracic Impedance ICM trend:     [name], RN 11/02/2023 8:16 AM

## 2023-11-11 ENCOUNTER — Ambulatory Visit (INDEPENDENT_AMBULATORY_CARE_PROVIDER_SITE_OTHER): Payer: Medicare HMO

## 2023-11-11 DIAGNOSIS — I5022 Chronic systolic (congestive) heart failure: Secondary | ICD-10-CM | POA: Diagnosis not present

## 2023-11-12 LAB — CUP PACEART REMOTE DEVICE CHECK
Battery Remaining Longevity: 74 mo
Battery Remaining Percentage: 75 %
Battery Voltage: 2.99 V
Brady Statistic AP VP Percent: 1.1 %
Brady Statistic AP VS Percent: 1 %
Brady Statistic AS VP Percent: 95 %
Brady Statistic AS VS Percent: 3.9 %
Brady Statistic RA Percent Paced: 1 %
Date Time Interrogation Session: 20251024023124
Implantable Lead Connection Status: 753985
Implantable Lead Connection Status: 753985
Implantable Lead Connection Status: 753985
Implantable Lead Implant Date: 20161229
Implantable Lead Implant Date: 20161229
Implantable Lead Implant Date: 20161229
Implantable Lead Location: 753858
Implantable Lead Location: 753859
Implantable Lead Location: 753860
Implantable Pulse Generator Implant Date: 20230728
Lead Channel Impedance Value: 1025 Ohm
Lead Channel Impedance Value: 390 Ohm
Lead Channel Impedance Value: 450 Ohm
Lead Channel Pacing Threshold Amplitude: 0.75 V
Lead Channel Pacing Threshold Amplitude: 0.75 V
Lead Channel Pacing Threshold Amplitude: 0.75 V
Lead Channel Pacing Threshold Pulse Width: 0.5 ms
Lead Channel Pacing Threshold Pulse Width: 0.5 ms
Lead Channel Pacing Threshold Pulse Width: 0.5 ms
Lead Channel Sensing Intrinsic Amplitude: 12 mV
Lead Channel Sensing Intrinsic Amplitude: 3 mV
Lead Channel Setting Pacing Amplitude: 1.75 V
Lead Channel Setting Pacing Amplitude: 2 V
Lead Channel Setting Pacing Amplitude: 2 V
Lead Channel Setting Pacing Pulse Width: 0.5 ms
Lead Channel Setting Pacing Pulse Width: 0.5 ms
Lead Channel Setting Sensing Sensitivity: 2 mV
Pulse Gen Model: 3562
Pulse Gen Serial Number: 8101642

## 2023-11-15 NOTE — Progress Notes (Signed)
 Remote PPM Transmission

## 2023-11-21 ENCOUNTER — Encounter: Payer: Self-pay | Admitting: Radiology

## 2023-11-23 ENCOUNTER — Ambulatory Visit: Payer: Self-pay | Admitting: Cardiovascular Disease

## 2023-11-29 DIAGNOSIS — Z6829 Body mass index (BMI) 29.0-29.9, adult: Secondary | ICD-10-CM | POA: Diagnosis not present

## 2023-11-29 DIAGNOSIS — M79642 Pain in left hand: Secondary | ICD-10-CM | POA: Diagnosis not present

## 2023-11-29 DIAGNOSIS — M2560 Stiffness of unspecified joint, not elsewhere classified: Secondary | ICD-10-CM | POA: Diagnosis not present

## 2023-11-29 DIAGNOSIS — M79641 Pain in right hand: Secondary | ICD-10-CM | POA: Diagnosis not present

## 2023-11-29 DIAGNOSIS — M1991 Primary osteoarthritis, unspecified site: Secondary | ICD-10-CM | POA: Diagnosis not present

## 2023-11-29 DIAGNOSIS — M1120 Other chondrocalcinosis, unspecified site: Secondary | ICD-10-CM | POA: Diagnosis not present

## 2023-11-29 DIAGNOSIS — E663 Overweight: Secondary | ICD-10-CM | POA: Diagnosis not present

## 2023-12-05 ENCOUNTER — Ambulatory Visit: Attending: Cardiovascular Disease

## 2023-12-05 DIAGNOSIS — Z95810 Presence of automatic (implantable) cardiac defibrillator: Secondary | ICD-10-CM

## 2023-12-05 DIAGNOSIS — I5022 Chronic systolic (congestive) heart failure: Secondary | ICD-10-CM | POA: Diagnosis not present

## 2023-12-05 NOTE — Progress Notes (Cosign Needed Addendum)
 EPIC Encounter for ICM Monitoring  Patient Name: Alexandra [name] is a 89 y.o. female Date: 12/05/2023 Primary Care Physican: Okey Carlin Redbird, MD Primary Cardiologist: Skains/Bensimhon Electrophysiologist: Mealor Bi-V Pacing:  96%   09/03/2022 Weight: 172 lbs 11/17/2022 Office Weight: 165 lbs 01/26/2023 Weight: 166 lbs   04/06/2023 Weight: 170 lbs  08/05/2023 Weight: 170 lbs 10/06/2024 Office Weight: 166.12 lbs 12/05/2023 Weight: 163 lbs   AT/AF Burden  <1% (takes Eliquis )                                                             Spoke with patient and heart failure questions reviewed.  Transmission results reviewed.  Pt asymptomatic for fluid accumulation.  She has arthritis pain in her legs and back which limits her ability to get out of the house very often.          Since 10/31/2023 ICM remote transmission: [facility] Thoracic impedance suggesting normal fluid levels with the exception of possible fluid from 11/23/2023-11/28/2023.   Prescribed:  Furosemide  40 mg Take one tablet (40 mg) by mouth once every other day. May take extra as needed.   Potassium 10 mEq 1 tablet daily.   Labs: 11/16/2022 Creatinine 1.10, BUN 39, Potassium 4.5, Sodium 142, GFR 47 A complete set of results can be found in Results Review.   Recommendations:  No changes and encouraged to call if experiencing any fluid symptoms.   Follow-up plan: ICM clinic phone appointment on 01/09/2024.  91 day device clinic remote transmission 02/10/2024.     EP/Cardiology Office Visits:  Recall 06/23/2024 with Jodie Passey, PA.   Recall 10/06/2024 with Dr Cherrie.   Copy of ICM check sent to Dr. Nancey.      Remote monitoring is medically necessary for Heart Failure Management.    Daily Thoracic Impedance ICM trend: 09/06/2023 through 12/05/2023.    12-14 Month Thoracic Impedance ICM trend:     [name], RN 12/05/2023 4:49 PM

## 2023-12-21 DIAGNOSIS — E039 Hypothyroidism, unspecified: Secondary | ICD-10-CM | POA: Diagnosis not present

## 2023-12-21 DIAGNOSIS — J4520 Mild intermittent asthma, uncomplicated: Secondary | ICD-10-CM | POA: Diagnosis not present

## 2023-12-21 DIAGNOSIS — Z6829 Body mass index (BMI) 29.0-29.9, adult: Secondary | ICD-10-CM | POA: Diagnosis not present

## 2023-12-21 DIAGNOSIS — E559 Vitamin D deficiency, unspecified: Secondary | ICD-10-CM | POA: Diagnosis not present

## 2023-12-21 DIAGNOSIS — I509 Heart failure, unspecified: Secondary | ICD-10-CM | POA: Diagnosis not present

## 2023-12-21 DIAGNOSIS — E7800 Pure hypercholesterolemia, unspecified: Secondary | ICD-10-CM | POA: Diagnosis not present

## 2023-12-21 DIAGNOSIS — I10 Essential (primary) hypertension: Secondary | ICD-10-CM | POA: Diagnosis not present

## 2023-12-21 DIAGNOSIS — F430 Acute stress reaction: Secondary | ICD-10-CM | POA: Diagnosis not present

## 2023-12-21 DIAGNOSIS — R000 Tachycardia, unspecified: Secondary | ICD-10-CM | POA: Diagnosis not present

## 2023-12-21 DIAGNOSIS — Z23 Encounter for immunization: Secondary | ICD-10-CM | POA: Diagnosis not present

## 2023-12-21 DIAGNOSIS — D6859 Other primary thrombophilia: Secondary | ICD-10-CM | POA: Diagnosis not present

## 2023-12-21 DIAGNOSIS — Z0000 Encounter for general adult medical examination without abnormal findings: Secondary | ICD-10-CM | POA: Diagnosis not present

## 2023-12-23 ENCOUNTER — Encounter (HOSPITAL_COMMUNITY): Payer: Self-pay

## 2023-12-23 ENCOUNTER — Other Ambulatory Visit (HOSPITAL_BASED_OUTPATIENT_CLINIC_OR_DEPARTMENT_OTHER): Payer: Self-pay

## 2023-12-23 ENCOUNTER — Other Ambulatory Visit: Payer: Self-pay

## 2023-12-23 ENCOUNTER — Other Ambulatory Visit (HOSPITAL_COMMUNITY): Payer: Self-pay

## 2023-12-23 MED ORDER — TRAMADOL HCL 50 MG PO TABS
50.0000 mg | ORAL_TABLET | Freq: Every day | ORAL | 0 refills | Status: DC | PRN
  Filled 2023-12-23: qty 20, 20d supply, fill #0
  Filled 2024-01-02: qty 7, 7d supply, fill #1
  Filled 2024-01-02: qty 13, 13d supply, fill #1
  Filled 2024-01-10: qty 6, 6d supply, fill #2

## 2023-12-23 MED ORDER — HYDRALAZINE HCL 10 MG PO TABS
10.0000 mg | ORAL_TABLET | Freq: Three times a day (TID) | ORAL | 4 refills | Status: AC
  Filled 2023-12-23: qty 270, 90d supply, fill #0

## 2023-12-23 MED ORDER — CITALOPRAM HYDROBROMIDE 10 MG PO TABS
10.0000 mg | ORAL_TABLET | Freq: Every day | ORAL | 4 refills | Status: DC
  Filled 2023-12-23 – 2024-01-08 (×2): qty 90, 90d supply, fill #0

## 2023-12-23 MED ORDER — POTASSIUM CHLORIDE ER 10 MEQ PO CPCR
10.0000 meq | ORAL_CAPSULE | Freq: Every day | ORAL | 4 refills | Status: DC
  Filled 2023-12-23: qty 90, 90d supply, fill #0

## 2023-12-23 MED ORDER — LEVOTHYROXINE SODIUM 25 MCG PO TABS
25.0000 ug | ORAL_TABLET | Freq: Every morning | ORAL | 4 refills | Status: AC
  Filled 2023-12-23: qty 90, 90d supply, fill #0

## 2023-12-23 MED ORDER — FUROSEMIDE 40 MG PO TABS
40.0000 mg | ORAL_TABLET | ORAL | 4 refills | Status: AC
  Filled 2023-12-23: qty 45, 90d supply, fill #0

## 2023-12-23 MED ORDER — ALBUTEROL SULFATE HFA 108 (90 BASE) MCG/ACT IN AERS
2.0000 | INHALATION_SPRAY | Freq: Four times a day (QID) | RESPIRATORY_TRACT | 3 refills | Status: AC | PRN
  Filled 2023-12-23: qty 6.7, 25d supply, fill #0

## 2023-12-23 MED ORDER — LIDOCAINE 5 % EX PTCH
1.0000 | MEDICATED_PATCH | Freq: Every day | CUTANEOUS | 0 refills | Status: AC
  Filled 2023-12-23: qty 60, 60d supply, fill #0
  Filled 2024-01-19: qty 60, 30d supply, fill #0

## 2023-12-26 ENCOUNTER — Other Ambulatory Visit (HOSPITAL_COMMUNITY): Payer: Self-pay

## 2023-12-27 ENCOUNTER — Other Ambulatory Visit: Payer: Self-pay

## 2023-12-27 ENCOUNTER — Other Ambulatory Visit (HOSPITAL_COMMUNITY): Payer: Self-pay

## 2023-12-31 ENCOUNTER — Other Ambulatory Visit (HOSPITAL_COMMUNITY): Payer: Self-pay

## 2024-01-02 ENCOUNTER — Other Ambulatory Visit: Payer: Self-pay

## 2024-01-02 ENCOUNTER — Other Ambulatory Visit (HOSPITAL_COMMUNITY): Payer: Self-pay

## 2024-01-02 MED ORDER — SPIRONOLACTONE 25 MG PO TABS
25.0000 mg | ORAL_TABLET | Freq: Every day | ORAL | 1 refills | Status: AC
  Filled 2024-01-02: qty 90, 90d supply, fill #0

## 2024-01-03 ENCOUNTER — Other Ambulatory Visit (HOSPITAL_COMMUNITY): Payer: Self-pay

## 2024-01-08 ENCOUNTER — Other Ambulatory Visit (HOSPITAL_COMMUNITY): Payer: Self-pay

## 2024-01-09 ENCOUNTER — Other Ambulatory Visit: Payer: Self-pay

## 2024-01-09 ENCOUNTER — Ambulatory Visit: Attending: Cardiovascular Disease

## 2024-01-09 DIAGNOSIS — I5022 Chronic systolic (congestive) heart failure: Secondary | ICD-10-CM

## 2024-01-09 DIAGNOSIS — Z95810 Presence of automatic (implantable) cardiac defibrillator: Secondary | ICD-10-CM | POA: Diagnosis not present

## 2024-01-09 NOTE — Progress Notes (Signed)
 EPIC Encounter for ICM Monitoring  Patient Name: Alexandra [name] is a 89 y.o. female Date: 01/09/2024 Primary Care Physican: Okey Carlin Redbird, MD Primary Cardiologist: Skains/Bensimhon Electrophysiologist: Mealor Bi-V Pacing:  96%   09/03/2022 Weight: 172 lbs 11/17/2022 Office Weight: 165 lbs 01/26/2023 Weight: 166 lbs   04/06/2023 Weight: 170 lbs  08/05/2023 Weight: 170 lbs 10/06/2024 Office Weight: 166.12 lbs 12/05/2023 Weight: 163 lbs   AT/AF Burden  <1% (takes Eliquis )                                                             Transmission results reviewed.           Since 12/05/2023 ICM remote transmission: [facility] Thoracic impedance suggesting normal fluid levels.   Prescribed:  Furosemide  40 mg Take one tablet (40 mg) by mouth once every other day. May take extra as needed.   Potassium 10 mEq 1 tablet daily.   Labs: 11/16/2022 Creatinine 1.10, BUN 39, Potassium 4.5, Sodium 142, GFR 47 A complete set of results can be found in Results Review.   Recommendations:  No changes.   Follow-up plan: ICM clinic phone appointment on 02/13/2024.  91 day device clinic remote transmission 02/10/2024.     EP/Cardiology Office Visits:  Recall 06/23/2024 with Jodie Passey, PA.   Recall 10/06/2024 with Dr Cherrie.   Copy of ICM check sent to Dr. Nancey.      Remote monitoring is medically necessary for Heart Failure Management.    Daily Thoracic Impedance ICM trend: 10/12/2023 through 01/08/2024.     [name], RN 01/09/2024 2:20 PM

## 2024-01-10 ENCOUNTER — Other Ambulatory Visit (HOSPITAL_COMMUNITY): Payer: Self-pay

## 2024-01-13 ENCOUNTER — Other Ambulatory Visit (HOSPITAL_COMMUNITY): Payer: Self-pay

## 2024-01-15 ENCOUNTER — Other Ambulatory Visit: Payer: Self-pay | Admitting: Physician Assistant

## 2024-01-16 ENCOUNTER — Other Ambulatory Visit (HOSPITAL_COMMUNITY): Payer: Self-pay

## 2024-01-16 ENCOUNTER — Other Ambulatory Visit: Payer: Self-pay

## 2024-01-16 MED ORDER — ISOSORBIDE MONONITRATE ER 30 MG PO TB24
30.0000 mg | ORAL_TABLET | Freq: Every day | ORAL | 2 refills | Status: AC
  Filled 2024-01-16: qty 90, 90d supply, fill #0

## 2024-01-17 ENCOUNTER — Telehealth: Payer: Self-pay | Admitting: Cardiovascular Disease

## 2024-01-17 ENCOUNTER — Other Ambulatory Visit (HOSPITAL_COMMUNITY): Payer: Self-pay

## 2024-01-17 MED ORDER — HYDROCODONE-ACETAMINOPHEN 5-325 MG PO TABS
1.0000 | ORAL_TABLET | Freq: Every day | ORAL | 0 refills | Status: DC | PRN
  Filled 2024-01-17 – 2024-01-18 (×2): qty 28, 28d supply, fill #0

## 2024-01-17 MED ORDER — LIDOCAINE 5 % EX PTCH
1.0000 | MEDICATED_PATCH | Freq: Every day | CUTANEOUS | 0 refills | Status: DC
  Filled 2024-01-17: qty 60, 30d supply, fill #0

## 2024-01-17 NOTE — Telephone Encounter (Signed)
 Pt saw pain specialist and pt son calling to see if the Lanacane patch they are wanting to give her is okay for her to get. Please advise.

## 2024-01-18 ENCOUNTER — Other Ambulatory Visit: Payer: Self-pay

## 2024-01-18 ENCOUNTER — Encounter: Payer: Self-pay | Admitting: Pharmacist

## 2024-01-18 ENCOUNTER — Other Ambulatory Visit (HOSPITAL_COMMUNITY): Payer: Self-pay

## 2024-01-18 NOTE — Telephone Encounter (Signed)
 Does she mean lidocaine  patches? Yes she can use

## 2024-01-20 ENCOUNTER — Telehealth (HOSPITAL_COMMUNITY): Payer: Self-pay | Admitting: Cardiology

## 2024-01-20 ENCOUNTER — Other Ambulatory Visit: Payer: Self-pay

## 2024-01-20 ENCOUNTER — Telehealth: Payer: Self-pay | Admitting: Cardiovascular Disease

## 2024-01-20 NOTE — Telephone Encounter (Signed)
 Alexandra [name], patient son and made him aware to follow up with Pcp and Dr. Bensimhon. [name], DPR verbalized an understanding.

## 2024-01-20 NOTE — Telephone Encounter (Signed)
 Patients son called to report pt has been experiencing generalized pain, it was recommended to start lidocaine  patch  Pts son would like confirmation if its ol to use OTC 4% patches   Please advise

## 2024-01-20 NOTE — Telephone Encounter (Signed)
 Pt son braden) asking if recommendation for using a Lidocaine  patch is okay at 4% along with her medication. Please advise.

## 2024-01-20 NOTE — Telephone Encounter (Signed)
 Spoke with pt and confirmed she would like to use OTC Lidocaine  patches.  Pt advised per pharmacist she may use patches.  Pt verbalizes understanding and thanked CHARITY FUNDRAISER for the call.

## 2024-01-23 ENCOUNTER — Other Ambulatory Visit: Payer: Self-pay

## 2024-01-23 NOTE — Telephone Encounter (Signed)
Pt aware via son

## 2024-01-24 ENCOUNTER — Other Ambulatory Visit: Payer: Self-pay

## 2024-01-25 ENCOUNTER — Other Ambulatory Visit (HOSPITAL_COMMUNITY): Payer: Self-pay

## 2024-01-25 ENCOUNTER — Other Ambulatory Visit: Payer: Self-pay

## 2024-01-30 ENCOUNTER — Other Ambulatory Visit: Payer: Self-pay

## 2024-02-05 ENCOUNTER — Other Ambulatory Visit (HOSPITAL_COMMUNITY): Payer: Self-pay | Admitting: Internal Medicine

## 2024-02-06 ENCOUNTER — Other Ambulatory Visit: Payer: Self-pay

## 2024-02-06 ENCOUNTER — Other Ambulatory Visit (HOSPITAL_COMMUNITY): Payer: Self-pay

## 2024-02-06 ENCOUNTER — Encounter: Payer: Self-pay | Admitting: Pharmacy Technician

## 2024-02-06 MED ORDER — APIXABAN 2.5 MG PO TABS
2.5000 mg | ORAL_TABLET | Freq: Two times a day (BID) | ORAL | 3 refills | Status: AC
  Filled 2024-02-06: qty 180, 90d supply, fill #0

## 2024-02-07 ENCOUNTER — Emergency Department (HOSPITAL_COMMUNITY)

## 2024-02-07 ENCOUNTER — Other Ambulatory Visit: Payer: Self-pay

## 2024-02-07 ENCOUNTER — Inpatient Hospital Stay (HOSPITAL_COMMUNITY)
Admission: EM | Admit: 2024-02-07 | Discharge: 2024-02-09 | DRG: 600 | Disposition: A | Discharge status: Home / Self Care | Attending: Internal Medicine | Admitting: Internal Medicine

## 2024-02-07 ENCOUNTER — Encounter (HOSPITAL_COMMUNITY): Payer: Self-pay

## 2024-02-07 DIAGNOSIS — M79604 Pain in right leg: Secondary | ICD-10-CM | POA: Diagnosis present

## 2024-02-07 DIAGNOSIS — N63 Unspecified lump in unspecified breast: Principal | ICD-10-CM

## 2024-02-07 DIAGNOSIS — R0602 Shortness of breath: Secondary | ICD-10-CM

## 2024-02-07 DIAGNOSIS — L03313 Cellulitis of chest wall: Secondary | ICD-10-CM | POA: Diagnosis present

## 2024-02-07 DIAGNOSIS — Z8249 Family history of ischemic heart disease and other diseases of the circulatory system: Secondary | ICD-10-CM

## 2024-02-07 DIAGNOSIS — Z950 Presence of cardiac pacemaker: Secondary | ICD-10-CM

## 2024-02-07 DIAGNOSIS — I5032 Chronic diastolic (congestive) heart failure: Secondary | ICD-10-CM

## 2024-02-07 DIAGNOSIS — Z833 Family history of diabetes mellitus: Secondary | ICD-10-CM

## 2024-02-07 DIAGNOSIS — L0390 Cellulitis, unspecified: Secondary | ICD-10-CM | POA: Diagnosis present

## 2024-02-07 DIAGNOSIS — E039 Hypothyroidism, unspecified: Secondary | ICD-10-CM | POA: Diagnosis present

## 2024-02-07 DIAGNOSIS — Z853 Personal history of malignant neoplasm of breast: Secondary | ICD-10-CM

## 2024-02-07 DIAGNOSIS — E7800 Pure hypercholesterolemia, unspecified: Secondary | ICD-10-CM | POA: Diagnosis present

## 2024-02-07 DIAGNOSIS — M1611 Unilateral primary osteoarthritis, right hip: Secondary | ICD-10-CM | POA: Diagnosis present

## 2024-02-07 DIAGNOSIS — M7989 Other specified soft tissue disorders: Secondary | ICD-10-CM

## 2024-02-07 DIAGNOSIS — Z66 Do not resuscitate: Secondary | ICD-10-CM | POA: Diagnosis present

## 2024-02-07 DIAGNOSIS — Z823 Family history of stroke: Secondary | ICD-10-CM

## 2024-02-07 DIAGNOSIS — Z86711 Personal history of pulmonary embolism: Secondary | ICD-10-CM

## 2024-02-07 DIAGNOSIS — K219 Gastro-esophageal reflux disease without esophagitis: Secondary | ICD-10-CM | POA: Diagnosis present

## 2024-02-07 DIAGNOSIS — Z90710 Acquired absence of both cervix and uterus: Secondary | ICD-10-CM

## 2024-02-07 DIAGNOSIS — Z800 Family history of malignant neoplasm of digestive organs: Secondary | ICD-10-CM

## 2024-02-07 DIAGNOSIS — M353 Polymyalgia rheumatica: Secondary | ICD-10-CM | POA: Diagnosis present

## 2024-02-07 DIAGNOSIS — I4891 Unspecified atrial fibrillation: Secondary | ICD-10-CM | POA: Diagnosis present

## 2024-02-07 DIAGNOSIS — M79605 Pain in left leg: Secondary | ICD-10-CM | POA: Diagnosis present

## 2024-02-07 DIAGNOSIS — I5042 Chronic combined systolic (congestive) and diastolic (congestive) heart failure: Secondary | ICD-10-CM | POA: Diagnosis present

## 2024-02-07 DIAGNOSIS — F32A Depression, unspecified: Secondary | ICD-10-CM | POA: Diagnosis present

## 2024-02-07 DIAGNOSIS — G8929 Other chronic pain: Secondary | ICD-10-CM | POA: Diagnosis present

## 2024-02-07 DIAGNOSIS — Z79899 Other long term (current) drug therapy: Secondary | ICD-10-CM

## 2024-02-07 DIAGNOSIS — Z7901 Long term (current) use of anticoagulants: Secondary | ICD-10-CM

## 2024-02-07 DIAGNOSIS — I130 Hypertensive heart and chronic kidney disease with heart failure and stage 1 through stage 4 chronic kidney disease, or unspecified chronic kidney disease: Secondary | ICD-10-CM | POA: Diagnosis present

## 2024-02-07 DIAGNOSIS — R7989 Other specified abnormal findings of blood chemistry: Secondary | ICD-10-CM

## 2024-02-07 DIAGNOSIS — R000 Tachycardia, unspecified: Secondary | ICD-10-CM | POA: Diagnosis present

## 2024-02-07 DIAGNOSIS — N1831 Chronic kidney disease, stage 3a: Secondary | ICD-10-CM | POA: Diagnosis present

## 2024-02-07 DIAGNOSIS — N61 Mastitis without abscess: Principal | ICD-10-CM | POA: Diagnosis present

## 2024-02-07 DIAGNOSIS — Z79890 Hormone replacement therapy (postmenopausal): Secondary | ICD-10-CM

## 2024-02-07 DIAGNOSIS — F419 Anxiety disorder, unspecified: Secondary | ICD-10-CM | POA: Diagnosis present

## 2024-02-07 DIAGNOSIS — Z820 Family history of epilepsy and other diseases of the nervous system: Secondary | ICD-10-CM

## 2024-02-07 DIAGNOSIS — D631 Anemia in chronic kidney disease: Secondary | ICD-10-CM | POA: Diagnosis present

## 2024-02-07 DIAGNOSIS — J45909 Unspecified asthma, uncomplicated: Secondary | ICD-10-CM | POA: Diagnosis present

## 2024-02-07 DIAGNOSIS — Z86718 Personal history of other venous thrombosis and embolism: Secondary | ICD-10-CM

## 2024-02-07 LAB — CBC
HCT: 39.7 % (ref 36.0–46.0)
Hemoglobin: 12.6 g/dL (ref 12.0–15.0)
MCH: 33 pg (ref 26.0–34.0)
MCHC: 31.7 g/dL (ref 30.0–36.0)
MCV: 103.9 fL — ABNORMAL HIGH (ref 80.0–100.0)
Platelets: 154 K/uL (ref 150–400)
RBC: 3.82 MIL/uL — ABNORMAL LOW (ref 3.87–5.11)
RDW: 12.9 % (ref 11.5–15.5)
WBC: 9.5 K/uL (ref 4.0–10.5)
nRBC: 0 % (ref 0.0–0.2)

## 2024-02-07 LAB — COMPREHENSIVE METABOLIC PANEL WITH GFR
ALT: 14 U/L (ref 0–44)
AST: 25 U/L (ref 15–41)
Albumin: 4.3 g/dL (ref 3.5–5.0)
Alkaline Phosphatase: 43 U/L (ref 38–126)
Anion gap: 10 (ref 5–15)
BUN: 36 mg/dL — ABNORMAL HIGH (ref 8–23)
CO2: 27 mmol/L (ref 22–32)
Calcium: 10 mg/dL (ref 8.9–10.3)
Chloride: 102 mmol/L (ref 98–111)
Creatinine, Ser: 1.11 mg/dL — ABNORMAL HIGH (ref 0.44–1.00)
GFR, Estimated: 46 mL/min — ABNORMAL LOW
Glucose, Bld: 95 mg/dL (ref 70–99)
Potassium: 4.9 mmol/L (ref 3.5–5.1)
Sodium: 139 mmol/L (ref 135–145)
Total Bilirubin: 0.4 mg/dL (ref 0.0–1.2)
Total Protein: 7.5 g/dL (ref 6.5–8.1)

## 2024-02-07 LAB — CK: Total CK: 46 U/L (ref 38–234)

## 2024-02-07 LAB — CBC WITH DIFFERENTIAL/PLATELET
Abs Immature Granulocytes: 0.04 K/uL (ref 0.00–0.07)
Basophils Absolute: 0 K/uL (ref 0.0–0.1)
Basophils Relative: 0 %
Eosinophils Absolute: 0.2 K/uL (ref 0.0–0.5)
Eosinophils Relative: 2 %
HCT: 41 % (ref 36.0–46.0)
Hemoglobin: 13.1 g/dL (ref 12.0–15.0)
Immature Granulocytes: 0 %
Lymphocytes Relative: 27 %
Lymphs Abs: 2.9 K/uL (ref 0.7–4.0)
MCH: 33.6 pg (ref 26.0–34.0)
MCHC: 32 g/dL (ref 30.0–36.0)
MCV: 105.1 fL — ABNORMAL HIGH (ref 80.0–100.0)
Monocytes Absolute: 0.9 K/uL (ref 0.1–1.0)
Monocytes Relative: 8 %
Neutro Abs: 6.7 K/uL (ref 1.7–7.7)
Neutrophils Relative %: 63 %
Platelets: 171 K/uL (ref 150–400)
RBC: 3.9 MIL/uL (ref 3.87–5.11)
RDW: 13.1 % (ref 11.5–15.5)
WBC: 10.7 K/uL — ABNORMAL HIGH (ref 4.0–10.5)
nRBC: 0 % (ref 0.0–0.2)

## 2024-02-07 LAB — PRO BRAIN NATRIURETIC PEPTIDE: Pro Brain Natriuretic Peptide: 778 pg/mL — ABNORMAL HIGH

## 2024-02-07 LAB — TROPONIN T, HIGH SENSITIVITY
Troponin T High Sensitivity: 27 ng/L — ABNORMAL HIGH (ref 0–19)
Troponin T High Sensitivity: 33 ng/L — ABNORMAL HIGH (ref 0–19)

## 2024-02-07 LAB — I-STAT CG4 LACTIC ACID, ED: Lactic Acid, Venous: 0.9 mmol/L (ref 0.5–1.9)

## 2024-02-07 MED ORDER — ALBUTEROL SULFATE (2.5 MG/3ML) 0.083% IN NEBU: 2.5000 mg | INHALATION_SOLUTION | Freq: Four times a day (QID) | RESPIRATORY_TRACT | Status: DC | PRN

## 2024-02-07 MED ORDER — LEVOTHYROXINE SODIUM 25 MCG PO TABS
25.0000 ug | ORAL_TABLET | Freq: Every morning | ORAL | Status: DC
  Administered 2024-02-08 – 2024-02-09 (×2): 25 ug via ORAL
  Filled 2024-02-07 (×2): qty 1

## 2024-02-07 MED ORDER — OXYCODONE HCL 5 MG PO TABS
5.0000 mg | ORAL_TABLET | Freq: Four times a day (QID) | ORAL | Status: AC | PRN
  Administered 2024-02-08: 5 mg via ORAL
  Filled 2024-02-07: qty 1

## 2024-02-07 MED ORDER — SODIUM CHLORIDE 0.9 % IV SOLN
1.0000 g | INTRAVENOUS | Status: DC
  Administered 2024-02-08: 1 g via INTRAVENOUS
  Filled 2024-02-07: qty 10

## 2024-02-07 MED ORDER — SODIUM CHLORIDE 0.9 % IV SOLN
1.0000 g | Freq: Once | INTRAVENOUS | Status: AC
  Administered 2024-02-07: 1 g via INTRAVENOUS
  Filled 2024-02-07: qty 10

## 2024-02-07 MED ORDER — FUROSEMIDE 40 MG PO TABS: 40.0000 mg | ORAL_TABLET | ORAL | Status: DC

## 2024-02-07 MED ORDER — VANCOMYCIN HCL 750 MG/150ML IV SOLN
750.0000 mg | INTRAVENOUS | Status: DC
  Filled 2024-02-07: qty 150

## 2024-02-07 MED ORDER — FAMOTIDINE 20 MG PO TABS
20.0000 mg | ORAL_TABLET | Freq: Every day | ORAL | Status: DC
  Administered 2024-02-08 – 2024-02-09 (×2): 20 mg via ORAL
  Filled 2024-02-07 (×2): qty 1

## 2024-02-07 MED ORDER — VANCOMYCIN HCL 1750 MG/350ML IV SOLN
1750.0000 mg | Freq: Once | INTRAVENOUS | Status: AC
  Administered 2024-02-08: 1750 mg via INTRAVENOUS
  Filled 2024-02-07 (×2): qty 350

## 2024-02-07 MED ORDER — ACETAMINOPHEN 650 MG RE SUPP: 650.0000 mg | Freq: Four times a day (QID) | RECTAL | Status: DC | PRN

## 2024-02-07 MED ORDER — IOHEXOL 350 MG/ML SOLN
75.0000 mL | Freq: Once | INTRAVENOUS | Status: AC | PRN
  Administered 2024-02-07: 75 mL via INTRAVENOUS

## 2024-02-07 MED ORDER — APIXABAN 2.5 MG PO TABS
2.5000 mg | ORAL_TABLET | Freq: Two times a day (BID) | ORAL | Status: DC
  Administered 2024-02-08 – 2024-02-09 (×3): 2.5 mg via ORAL
  Filled 2024-02-07 (×3): qty 1

## 2024-02-07 MED ORDER — MORPHINE SULFATE (PF) 2 MG/ML IV SOLN
2.0000 mg | Freq: Once | INTRAVENOUS | Status: AC
  Administered 2024-02-07: 2 mg via INTRAVENOUS
  Filled 2024-02-07: qty 1

## 2024-02-07 MED ORDER — CITALOPRAM HYDROBROMIDE 20 MG PO TABS
10.0000 mg | ORAL_TABLET | Freq: Every day | ORAL | Status: DC
  Administered 2024-02-08 – 2024-02-09 (×2): 10 mg via ORAL
  Filled 2024-02-07 (×2): qty 1

## 2024-02-07 MED ORDER — ONDANSETRON HCL 4 MG/2ML IJ SOLN
4.0000 mg | Freq: Once | INTRAMUSCULAR | Status: AC
  Administered 2024-02-07: 4 mg via INTRAVENOUS
  Filled 2024-02-07: qty 2

## 2024-02-07 MED ORDER — MORPHINE SULFATE (PF) 4 MG/ML IV SOLN
4.0000 mg | Freq: Once | INTRAVENOUS | Status: AC
  Administered 2024-02-07: 4 mg via INTRAVENOUS
  Filled 2024-02-07: qty 1

## 2024-02-07 MED ORDER — NALOXONE HCL 0.4 MG/ML IJ SOLN: 0.4000 mg | INTRAMUSCULAR | Status: DC | PRN

## 2024-02-07 MED ORDER — ACETAMINOPHEN 325 MG PO TABS: 650.0000 mg | ORAL_TABLET | Freq: Four times a day (QID) | ORAL | Status: DC | PRN

## 2024-02-07 NOTE — Progress Notes (Signed)
 Right lower extremity venous duplex has been completed. Preliminary results can be found in CV Proc through chart review.  Results were given to Dr. Yolande.  02/07/24 4:42 PM Cathlyn Collet RVT

## 2024-02-07 NOTE — ED Triage Notes (Signed)
 Patient was ambulatory with a walker prior to her legs starting to hurt worse a couple weeks ago. She states the pain is so bad at night she is unable to sleep.  She states her left breast is swollen and sore and feels like it weighs 3 pounds. Denies any reddness or fever. Has had cancer in that breast about 20 years ago.

## 2024-02-07 NOTE — ED Provider Notes (Addendum)
 " [facility] Provider Note   CSN: [id number] Arrival date & time: 02/07/24  1152     Patient presents with: No chief complaint on file.   Alexandra [name] is a 89 y.o. female.   89 year old female history of breast cancer in remission, osteoarthritis, CKD, hypertension, sacroiliitis, polymyalgia rheumatica?, DVT/PE on Eliquis , CHF, and chronic pain who presents to the emergency department with shortness of breath, breast swelling, and leg pain. Over the past week started noticing some swelling of her left breast where she had cancer previously.  Painless.  No fevers or chills.  Has been short of breath for the past month.  No congestion, cough, or chest pain.  Patient reports that she has had bilateral lower extremity pain for the past year.  Worse in the right lower extremity than the left.  Sharp and starts in her shin and radiates up her leg.  Worse with movement.  Worse at night.         Prior to Admission medications  Medication Sig Start Date End Date Taking? Authorizing Provider  albuterol  (PROVENTIL  HFA;VENTOLIN  HFA) 108 (90 BASE) MCG/ACT inhaler Inhale 2 puffs into the lungs every 6 (six) hours as needed for wheezing or shortness of breath. Reported on 05/27/2015    [provider]  albuterol  (VENTOLIN  HFA) 108 (90 Base) MCG/ACT inhaler Inhale 2 puffs into the lungs every 6 (six) hours as needed. 12/21/23     ALPRAZolam  (XANAX ) 0.25 MG tablet Take 0.25 mg by mouth 3 (three) times daily as needed for anxiety. Reported on 05/27/2015    [provider]  amoxicillin  (AMOXIL ) 500 MG capsule Take 4 capsules by mouth as needed 1 hour before dental work 12/04/15   Bensimhon, Toribio SAUNDERS, MD  apixaban  (ELIQUIS ) 2.5 MG TABS tablet Take 1 tablet (2.5 mg total) by mouth 2 (two) times daily. 02/06/24   Bensimhon, Toribio SAUNDERS, MD  b complex vitamins tablet Take 1 tablet by mouth daily with lunch.     [provider]  calcium  carbonate  (TUMS - DOSED IN MG ELEMENTAL CALCIUM ) 500 MG chewable tablet Chew 2 tablets by mouth daily as needed for indigestion or heartburn. Reported on 07/18/2015    [provider]  cholecalciferol  (VITAMIN D ) 1000 UNITS tablet Take 1,000 Units by mouth daily with lunch.     [provider]  citalopram  (CELEXA ) 10 MG tablet Take 1 tablet (10 mg total) by mouth daily. 12/21/23     famotidine  (PEPCID ) 20 MG tablet Take 1 tablet (20 mg total) by mouth daily. 09/15/23     ferrous sulfate 325 (65 FE) MG tablet Take 325 mg by mouth daily with breakfast.    [provider]  furosemide  (LASIX ) 40 MG tablet Take 1 tablet (40 mg total) by mouth every other day. 12/21/23     hydrALAZINE  (APRESOLINE ) 10 MG tablet Take 1 tablet (10 mg total) by mouth 3 (three) times daily. take with food 12/21/23     HYDROcodone -acetaminophen  (NORCO/VICODIN) 5-325 MG tablet Take 1 tablet by mouth daily as needed. 01/17/24     isosorbide  mononitrate (IMDUR ) 30 MG 24 hr tablet Take 1 tablet (30 mg total) by mouth daily. 01/16/24   Leverne Charlies Helling, PA-C  lactose free nutrition (BOOST) LIQD Take 237 mLs by mouth daily with breakfast.     [provider]  levothyroxine  (SYNTHROID ) 25 MCG tablet Take 1 tablet (25 mcg total) by mouth in the morning on an empty stomach  12/21/23     lidocaine  (LIDODERM ) 5 % apply 1 patch to each knee once daily remove after 12 hours 12/22/23     lidocaine  (LIDODERM ) 5 % Place 1 patch onto the skin daily to each knee. Remove after 12 hours. 01/17/24     magnesium  oxide (MAG-OX) 400 (240 Mg) MG tablet Take 0.5 tablets (200 mg total) by mouth daily. 07/12/23     Multiple Vitamins-Minerals (CENTRUM ADULTS PO) Take by mouth.    [provider]  nitroGLYCERIN  (NITROSTAT ) 0.4 MG SL tablet Place 1 tablet (0.4 mg total) under the tongue every 5 (five) minutes as needed for chest pain. 02/02/23   Fernande Elspeth BROCKS, MD  oxyCODONE -acetaminophen  (PERCOCET/ROXICET) 5-325 MG tablet Take by  mouth every 4 (four) hours as needed for severe pain (pain score 7-10).    [provider]  Polyethyl Glycol-Propyl Glycol (SYSTANE OP) Place 1 drop into both eyes 2 (two) times daily.    [provider]  potassium chloride  (MICRO-K ) 10 MEQ CR capsule Take 1 capsule (10 mEq total) by mouth daily. Take with food 12/21/23     predniSONE  (DELTASONE ) 10 MG tablet Take 4 tablets once a day for 7 days, 3.5 tablets daily for 7 days, 3 tabs daily for 7 days, 2.5 tabs daily for 7 days, 2 tabs daily for 14 days, 1.5 tabs daily for 14 days 09/01/23     spironolactone  (ALDACTONE ) 25 MG tablet Take 1 tablet (25 mg total) by mouth daily. 01/02/24     traMADol  (ULTRAM ) 50 MG tablet Take 1 tablet (50 mg total) by mouth every 6 (six) hours as needed for severe pain (7-10). 05/15/23     traMADol  (ULTRAM ) 50 MG tablet Take 1 tablet (50 mg total) by mouth daily as needed for pain 12/22/23       Allergies: Amiodarone , Cymbalta [duloxetine hcl], and Lyrica [pregabalin]    Review of Systems  Updated Vital Signs BP (!) 119/56 (BP Location: Left Arm)   Pulse 100   Temp 98.3 F (36.8 C) (Oral)   Resp 18   Ht 5' 4 (1.626 m)   Wt 77.1 kg   SpO2 100%   BMI 29.18 kg/m   Physical Exam Vitals and nursing note reviewed.  Constitutional:      General: She is not in acute distress.    Appearance: She is well-developed.  HENT:     Head: Normocephalic and atraumatic.     Right Ear: External ear normal.     Left Ear: External ear normal.     Nose: Nose normal.  Eyes:     Extraocular Movements: Extraocular movements intact.     Conjunctiva/sclera: Conjunctivae normal.     Pupils: Pupils are equal, round, and reactive to light.  Cardiovascular:     Rate and Rhythm: Normal rate and regular rhythm.     Heart sounds: No murmur heard. Pulmonary:     Effort: Pulmonary effort is normal. No respiratory distress.     Breath sounds: Normal breath sounds.  Chest:    Abdominal:     General: Abdomen is  flat. There is no distension.     Palpations: Abdomen is soft. There is no mass.     Tenderness: There is no abdominal tenderness. There is no guarding.  Musculoskeletal:     Cervical back: Normal range of motion and neck supple.     Right lower leg: No edema.     Left lower leg: No edema.     Comments: DP  pulses 2+ bilaterally  Skin:    General: Skin is warm and dry.  Neurological:     Mental Status: She is alert and oriented to person, place, and time. Mental status is at baseline.  Psychiatric:        Mood and Affect: Mood normal.    Left breast:  (all labs ordered are listed, but only abnormal results are displayed) Labs Reviewed  CBC WITH DIFFERENTIAL/PLATELET - Abnormal; Notable for the following components:      Result Value   WBC 10.7 (*)    MCV 105.1 (*)    All other components within normal limits  COMPREHENSIVE METABOLIC PANEL WITH GFR - Abnormal; Notable for the following components:   BUN 36 (*)    Creatinine, Ser 1.11 (*)    GFR, Estimated 46 (*)    All other components within normal limits  CULTURE, BLOOD (ROUTINE X 2)  CULTURE, BLOOD (ROUTINE X 2)  URINALYSIS, ROUTINE W REFLEX MICROSCOPIC  PRO BRAIN NATRIURETIC PEPTIDE  CK  I-STAT CG4 LACTIC ACID, ED  TROPONIN T, HIGH SENSITIVITY    EKG: None  Radiology: DG Hip Unilat W or Wo Pelvis 2-3 Views Right Result Date: 02/07/2024 CLINICAL DATA:  Right hip pain for several weeks EXAM: DG HIP (WITH OR WITHOUT PELVIS) 2-3V RIGHT COMPARISON:  None Available. FINDINGS: There is no evidence of hip fracture or dislocation. No significant joint space narrowing is noted. Mild osteophyte formation of right hip is noted. IMPRESSION: Mild degenerative joint disease of right hip. No acute abnormality seen. Electronically Signed   By: Lynwood Landy Raddle M.D.   On: 02/07/2024 16:28     Procedures   Medications Ordered in the ED  cefTRIAXone  (ROCEPHIN ) 1 g in sodium chloride  0.9 % 100 mL IVPB (1 g Intravenous New Bag/Given  02/07/24 1627)  morphine  (PF) 2 MG/ML injection 2 mg (2 mg Intravenous Given 02/07/24 1358)  morphine  (PF) 4 MG/ML injection 4 mg (4 mg Intravenous Given 02/07/24 1613)  ondansetron  (ZOFRAN ) injection 4 mg (4 mg Intravenous Given 02/07/24 1612)    Clinical Course as of 02/07/24 1637  Tue Feb 07, 2024  1607 Signed out to Dr Ginger [RP]    Clinical Course User Index [RP] Yolande Lamar BROCKS, MD                                 Medical Decision Making Amount and/or Complexity of Data Reviewed Labs: ordered. Radiology: ordered.  Risk Prescription drug management.   89 year old female history of breast cancer in remission, osteoarthritis, CKD, hypertension, sacroiliitis, polymyalgia rheumatica?, DVT/PE on Eliquis , CHF, and chronic pain who presents to the emergency department with shortness of breath, breast swelling, and leg pain  Initial Ddx:  Breast cancer, mastitis/cellulitis, PE, pericardial effusion, pleural effusion, bronchitis, CHF, pneumonia, URI, DVT  MDM/Course:  Patient presents to the emergency department with multiple complaints.  Most worrisome to me is the fact that she is having painless swelling of her left breast.  Does appear quite erythematous and indurated.  I am not able to palpate a discrete mass otherwise.  Does have a history of breast cancer in this area.  Also says that she has been more short of breath recently as well.  Not having any other infectious symptoms.  Reporting bilateral lower extremity swelling which she is being treated for as an outpatient.  May be multifactorial but has osteo arthritis of her knees, back problems, and  also has been worked up for polymyagia rheumatica which could all be contributing.  On exam is not in acute distress.  She satting well on room air.  No abnormal lung sounds.  Does have a large indurated breast mass.  Neurovascular intact in her legs without significant swelling.  Concerned that she could have breast cancer which would  increase her risk for DVT and PE.  Will obtain CTA of the chest for this.  Will also obtain an ultrasound of her leg.  Will give her some antibiotics in case of mastitis causing left breast swelling but without it being painful or having any other infectious symptoms that malignancy is more likely the cause.  Did explain to the patient and her daughter at bedside this could be cancer.  She is also worried about her hip well so we will obtain an x-ray.  Will extend the CT to be the abdomen pelvis in case of metastatic disease causing any of her pain.  Will need to follow-up with the breast surgeon regarding the breast swelling   This patient presents to the ED for concern of complaints listed in HPI, this involves an extensive number of treatment options, and is a complaint that carries with it a high risk of complications and morbidity. Disposition including potential need for admission considered.   Dispo: Pending remainder of workup  I have reviewed the patients home medications and made adjustments as needed Additional history obtained from daughter Records reviewed Outpatient Clinic Notes The following labs were independently interpreted: Chemistry and show no acute abnormality Social Determinants of health:  Geriatric  Portions of this note were generated with Scientist, clinical (histocompatibility and immunogenetics). Dictation errors may occur despite best attempts at proofreading.     Final diagnoses:  Breast swelling  Shortness of breath  Chronic pain of right lower extremity    ED Discharge Orders     None          Yolande Lamar BROCKS, MD 02/07/24 1637    Yolande Lamar BROCKS, MD 02/07/24 [phone]  "

## 2024-02-07 NOTE — ED Provider Notes (Signed)
 Care assumed from Dr. Lenon.  At time of transfer of care, patient is awaiting for results of leg ultrasound and CT imaging of the torso to look for concerning etiology of the patient's breast swelling and shortness of breath with a history of clots and cancer.  Anticipate reassessment after workup to determine disposition.  8:52 PM CT scan shows evidence of cellulitis on the torso.  Given her white count, age, and it spreading today, she will be admitted for IV antibiotics and management.  She is also having worsening of her chronic pain in her leg she reports and wanted more pain medicine.  This was ordered.  She also had some oxygen  saturation's that were dipping into the upper 80s but her CT did not show dissection or PE.  Will call for admission.  Clinical Impression: 1. Breast swelling   2. Shortness of breath   3. Chronic pain of right lower extremity     Disposition: Admit  This note was prepared with assistance of Dragon voice recognition software. Occasional wrong-word or sound-a-like substitutions may have occurred due to the inherent limitations of voice recognition software.     Ilir Mahrt, Lonni PARAS, MD 02/07/24 2052

## 2024-02-07 NOTE — H&P (Signed)
 " History and Physical    Alexandra [name] FMW:[id number] DOB: 06-07-1927 DOA: 02/07/2024  PCP: Okey Carlin Redbird, MD  Patient coming from: Home  Chief Complaint: Left breast swelling  HPI: Alexandra [name] is a 89 y.o. female with medical history significant of osteoarthritis, polymyalgia rheumatica?,  chronic pain, CHF, CKD stage IIIa, A-fib, CRT-P, Takotsubo cardiomyopathy, left bundle branch block, PE/DVT in 2016, hypertension, hyperlipidemia, hypothyroidism, anemia of chronic disease, anxiety, depression, asthma, history of left breast cancer status post lumpectomy presenting with complaints of left breast swelling and bilateral leg pain.  Patient states for the past week she has noticed that her left breast feels heavy but she is not having any pain or drainage.  She is not sure if there are any skin changes to her breast.  She reports history of left breast cancer 20 years ago which was treated with lumpectomy and she took tamoxifen for 5 years.  She reports history of chronic bilateral leg pain for the past 1 year (R >L) for which she is seen by pain management and has received hyaluronic acid knee injections.  She takes hydrocodone  at home for her chronic pain.  Also reports chronic shortness of breath due to history of asthma and does not think this is any worse than her baseline.  She uses albuterol  inhaler at home as needed.  Denies fevers, chills, cough, chest pain, nausea, vomiting, abdominal pain, or diarrhea.  ED Course: Mildly tachycardic on arrival but remainder of vital signs stable.  Afebrile.  Labs notable for WBC count 10.7, creatinine 1.1 (stable), blood cultures in process, lactic acid normal, proBNP 778, troponin 27, CK normal.  X-ray of right hip/pelvis showing mild degenerative joint disease of right hip and no acute osseous abnormality.  CTA chest negative for PE or pneumonia but showing findings consistent with cellulitis of the left breast and left lateral chest wall.  No  abscess or soft tissue gas seen.  CT abdomen pelvis showing no acute findings.  Right lower extremity DVT study negative.  Patient was given morphine , Zofran , and ceftriaxone .  Review of Systems:  Review of Systems  All other systems reviewed and are negative.   Past Medical History:  Diagnosis Date   Adult hypothyroidism 04/02/2014   Anemia of chronic disease 08/13/2014   Anxiety    Arthritis    Asthma    breast ca dx'd 2000   left   Bursitis    right hip   CAP (community acquired pneumonia) 12/19/2012   Chest pain    NM stress test 05/2011 Normal   CHF (congestive heart failure) (HCC) 08/19/2014   CKD (chronic kidney disease) stage 3, GFR 30-59 ml/min (HCC) 08/13/2014   Depression    DOE (dyspnea on exertion)    No SOB at rest   Essential (primary) hypertension 04/02/2014   Fatigue    excertional   Headache(784.0)    Hereditary and idiopathic peripheral neuropathy 07/02/2014   Hypercholesteremia    Intermittent LBBB (left bundle branch block) 12/21/2012   Leg weakness    Neuropathy    Obesity    Osteopenia    Peripheral neuropathy    Presence of permanent cardiac pacemaker 01/16/2015   BIV   Sacroiliitis, not elsewhere classified    Situational depression    Trigger finger    Dr. Everlean    Past Surgical History:  Procedure Laterality Date   ACHILLES TENDON SURGERY     BIV PACEMAKER GENERATOR CHANGEOUT N/A 08/14/2021   Procedure: BIV PACEMAKER  GENERATOR CHANGEOUT;  Surgeon: Fernande Elspeth BROCKS, MD;  Location: [facility] INVASIVE CV LAB;  Service: Cardiovascular;  Laterality: N/A;   BREAST LUMPECTOMY     breast cancer   CARDIAC CATHETERIZATION N/A 07/21/2015   Procedure: Right Heart Cath;  Surgeon: Toribio JONELLE Fuel, MD;  Location: [facility] INVASIVE CV LAB;  Service: Cardiovascular;  Laterality: N/A;   CHOLECYSTECTOMY N/A 08/13/2014   Procedure: LAPAROSCOPIC CHOLECYSTECTOMY;  Surgeon: Jina Nephew, MD;  Location: [facility] OR[facility];  Service: General;  Laterality: N/A;   EP IMPLANTABLE DEVICE  N/A 01/16/2015   Procedure: BiV Pacemaker Insertion CRT-P;  Surgeon: Elspeth BROCKS Fernande, MD;  Location: [facility] INVASIVE CV LAB;  Service: Cardiovascular;  Laterality: N/A;   LEG SURGERY     LUMBAR LAMINECTOMY     POLYPECTOMY     TONSILLECTOMY     VESICOVAGINAL FISTULA CLOSURE W/ TAH     DC hysterectomy     reports that she has never smoked. She has never used smokeless tobacco. She reports that she does not drink alcohol and does not use drugs.  Allergies[1]  Family History  Problem Relation Age of Onset   Pneumonia Mother 72       cause of death   Diabetes Brother    Pancreatic cancer Father 78   Alzheimer's disease Sister    CVA Daughter        01/21/09   Stroke Daughter    Hypertension Daughter    Heart attack Neg Hx     Prior to Admission medications  Medication Sig Start Date End Date Taking? Authorizing Provider  albuterol  (PROVENTIL  HFA;VENTOLIN  HFA) 108 (90 BASE) MCG/ACT inhaler Inhale 2 puffs into the lungs every 6 (six) hours as needed for wheezing or shortness of breath. Reported on 05/27/2015    [provider]  albuterol  (VENTOLIN  HFA) 108 (90 Base) MCG/ACT inhaler Inhale 2 puffs into the lungs every 6 (six) hours as needed. 12/21/23     ALPRAZolam  (XANAX ) 0.25 MG tablet Take 0.25 mg by mouth 3 (three) times daily as needed for anxiety. Reported on 05/27/2015    [provider]  amoxicillin  (AMOXIL ) 500 MG capsule Take 4 capsules by mouth as needed 1 hour before dental work 12/04/15   Bensimhon, Toribio JONELLE, MD  apixaban  (ELIQUIS ) 2.5 MG TABS tablet Take 1 tablet (2.5 mg total) by mouth 2 (two) times daily. 02/06/24   Bensimhon, Toribio JONELLE, MD  b complex vitamins tablet Take 1 tablet by mouth daily with lunch.     [provider]  calcium  carbonate (TUMS - DOSED IN MG ELEMENTAL CALCIUM ) 500 MG chewable tablet Chew 2 tablets by mouth daily as needed for indigestion or heartburn. Reported on 07/18/2015    [provider]  cholecalciferol  (VITAMIN D ) 1000  UNITS tablet Take 1,000 Units by mouth daily with lunch.     [provider]  citalopram  (CELEXA ) 10 MG tablet Take 1 tablet (10 mg total) by mouth daily. 12/21/23     famotidine  (PEPCID ) 20 MG tablet Take 1 tablet (20 mg total) by mouth daily. 09/15/23     ferrous sulfate 325 (65 FE) MG tablet Take 325 mg by mouth daily with breakfast.    [provider]  furosemide  (LASIX ) 40 MG tablet Take 1 tablet (40 mg total) by mouth every other day. 12/21/23     hydrALAZINE  (APRESOLINE ) 10 MG tablet Take 1 tablet (10 mg total) by mouth 3 (three) times daily. take with food 12/21/23     HYDROcodone -acetaminophen  (NORCO/VICODIN) 5-325 MG  tablet Take 1 tablet by mouth daily as needed. 01/17/24     isosorbide  mononitrate (IMDUR ) 30 MG 24 hr tablet Take 1 tablet (30 mg total) by mouth daily. 01/16/24   Leverne Charlies Helling, PA-C  lactose free nutrition (BOOST) LIQD Take 237 mLs by mouth daily with breakfast.     [provider]  levothyroxine  (SYNTHROID ) 25 MCG tablet Take 1 tablet (25 mcg total) by mouth in the morning on an empty stomach 12/21/23     lidocaine  (LIDODERM ) 5 % apply 1 patch to each knee once daily remove after 12 hours 12/22/23     lidocaine  (LIDODERM ) 5 % Place 1 patch onto the skin daily to each knee. Remove after 12 hours. 01/17/24     magnesium  oxide (MAG-OX) 400 (240 Mg) MG tablet Take 0.5 tablets (200 mg total) by mouth daily. 07/12/23     Multiple Vitamins-Minerals (CENTRUM ADULTS PO) Take by mouth.    [provider]  nitroGLYCERIN  (NITROSTAT ) 0.4 MG SL tablet Place 1 tablet (0.4 mg total) under the tongue every 5 (five) minutes as needed for chest pain. 02/02/23   Fernande Elspeth BROCKS, MD  oxyCODONE -acetaminophen  (PERCOCET/ROXICET) 5-325 MG tablet Take by mouth every 4 (four) hours as needed for severe pain (pain score 7-10).    [provider]  Polyethyl Glycol-Propyl Glycol (SYSTANE OP) Place 1 drop into both eyes 2 (two) times daily.    [provider]  potassium chloride  (MICRO-K ) 10 MEQ CR capsule Take 1 capsule (10 mEq total) by mouth daily. Take with food 12/21/23     predniSONE  (DELTASONE ) 10 MG tablet Take 4 tablets once a day for 7 days, 3.5 tablets daily for 7 days, 3 tabs daily for 7 days, 2.5 tabs daily for 7 days, 2 tabs daily for 14 days, 1.5 tabs daily for 14 days 09/01/23     spironolactone  (ALDACTONE ) 25 MG tablet Take 1 tablet (25 mg total) by mouth daily. 01/02/24     traMADol  (ULTRAM ) 50 MG tablet Take 1 tablet (50 mg total) by mouth every 6 (six) hours as needed for severe pain (7-10). 05/15/23     traMADol  (ULTRAM ) 50 MG tablet Take 1 tablet (50 mg total) by mouth daily as needed for pain 12/22/23       Physical Exam: Vitals:   02/07/24 1830 02/07/24 1900 02/07/24 1945 02/07/24 2030  BP:  (!) 114/55 (!) 99/52 (!) 115/51  Pulse:  94 91 91  Resp:      Temp:      TempSrc:      SpO2: 98% 94% 92% 91%  Weight:      Height:        Physical Exam Vitals reviewed.  Constitutional:      General: She is not in acute distress. HENT:     Head: Normocephalic and atraumatic.  Eyes:     Extraocular Movements: Extraocular movements intact.  Cardiovascular:     Rate and Rhythm: Normal rate and regular rhythm.     Pulses: Normal pulses.  Pulmonary:     Effort: Pulmonary effort is normal. No respiratory distress.     Breath sounds: Normal breath sounds. No wheezing or rales.  Abdominal:     General: Bowel sounds are normal.     Palpations: Abdomen is soft.     Tenderness: There is no abdominal tenderness. There is no guarding.  Musculoskeletal:     Cervical back: Normal range of motion.     Right lower leg: No edema.  Left lower leg: No edema.  Skin:    General: Skin is warm and dry.     Comments: Erythema and induration of skin of left breast/lateral chest wall.  No discharge from nipple.  See image.  Neurological:     General: No focal deficit present.     Mental Status: She is alert and oriented to  person, place, and time.      Labs on Admission: I have personally reviewed following labs and imaging studies  CBC: Recent Labs  Lab 02/07/24 1405  WBC 10.7*  NEUTROABS 6.7  HGB 13.1  HCT 41.0  MCV 105.1*  PLT 171   Basic Metabolic Panel: Recent Labs  Lab 02/07/24 1405  NA 139  K 4.9  CL 102  CO2 27  GLUCOSE 95  BUN 36*  CREATININE 1.11*  CALCIUM  10.0   GFR: Estimated Creatinine Clearance: 31.2 mL/min (A) (by C-G formula based on SCr of 1.11 mg/dL (H)). Liver Function Tests: Recent Labs  Lab 02/07/24 1405  AST 25  ALT 14  ALKPHOS 43  BILITOT 0.4  PROT 7.5  ALBUMIN 4.3   No results for input(s): LIPASE, AMYLASE in the last 168 hours. No results for input(s): AMMONIA in the last 168 hours. Coagulation Profile: No results for input(s): INR, PROTIME in the last 168 hours. Cardiac Enzymes: Recent Labs  Lab 02/07/24 1626  CKTOTAL 46   BNP (last 3 results) Recent Labs    02/07/24 1626  PROBNP 778.0*   HbA1C: No results for input(s): HGBA1C in the last 72 hours. CBG: No results for input(s): GLUCAP in the last 168 hours. Lipid Profile: No results for input(s): CHOL, HDL, LDLCALC, TRIG, CHOLHDL, LDLDIRECT in the last 72 hours. Thyroid  Function Tests: No results for input(s): TSH, T4TOTAL, FREET4, T3FREE, THYROIDAB in the last 72 hours. Anemia Panel: No results for input(s): VITAMINB12, FOLATE, FERRITIN, TIBC, IRON, RETICCTPCT in the last 72 hours. Urine analysis:    Component Value Date/Time   COLORURINE YELLOW (A) 09/21/2020 0749   APPEARANCEUR CLEAR (A) 09/21/2020 0749   LABSPEC 1.010 09/21/2020 0749   PHURINE 8.0 09/21/2020 0749   GLUCOSEU NEGATIVE 09/21/2020 0749   HGBUR NEGATIVE 09/21/2020 0749   BILIRUBINUR NEGATIVE 09/21/2020 0749   KETONESUR NEGATIVE 09/21/2020 0749   PROTEINUR NEGATIVE 09/21/2020 0749   UROBILINOGEN 0.2 08/19/2014 1747   NITRITE NEGATIVE 09/21/2020 0749   LEUKOCYTESUR  SMALL (A) 09/21/2020 0749    Radiological Exams on Admission: CT Angio Chest PE W and/or Wo Contrast Addendum Date: 02/07/2024 ADDENDUM REPORT: 02/07/2024 18:06 ADDENDUM: There is thickening of the skin of the left breast. There is diffuse stranding and edema in the soft tissues of the left breast and left lateral chest wall most consistent with cellulitis. No drainable fluid collection or abscess. No soft tissue gas. Electronically Signed   By: Vanetta Chou M.D.   On: 02/07/2024 18:06   Result Date: 02/07/2024 CLINICAL DATA:  Leg and back pain. EXAM: CT ANGIOGRAPHY CHEST CT ABDOMEN AND PELVIS WITH CONTRAST TECHNIQUE: Multidetector CT imaging of the chest was performed using the standard protocol during bolus administration of intravenous contrast. Multiplanar CT image reconstructions and MIPs were obtained to evaluate the vascular anatomy. Multidetector CT imaging of the abdomen and pelvis was performed using the standard protocol during bolus administration of intravenous contrast. RADIATION DOSE REDUCTION: This exam was performed according to the departmental dose-optimization program which includes automated exposure control, adjustment of the mA and/or kV according to patient size and/or use of iterative reconstruction  technique. CONTRAST:  75mL OMNIPAQUE  IOHEXOL  350 MG/ML SOLN COMPARISON:  CT abdomen pelvis dated 05/11/2023. FINDINGS: CTA CHEST FINDINGS Cardiovascular: There is no cardiomegaly or pericardial effusion. There is coronary vascular calcification of the LAD. Left pectoral pacemaker device. There is moderate atherosclerotic calcification of the thoracic aorta. No aneurysmal dilatation or dissection. The origins of the great vessels of the aortic arch are patent. No pulmonary artery embolus identified. Mediastinum/Nodes: No hilar or mediastinal adenopathy. The esophagus is grossly unremarkable. No mediastinal fluid collection. Lungs/Pleura: There is eventration of the left hemidiaphragm  with left lung base atelectasis. No focal consolidation, pleural effusion or pneumothorax. The central airways are patent. Musculoskeletal: Osteopenia with degenerative changes of the spine. No acute osseous pathology. Review of the MIP images confirms the above findings. CT ABDOMEN and PELVIS FINDINGS No intra-abdominal free air or free fluid. Hepatobiliary: The liver is unremarkable. There is biliary ductal dilatation, post cholecystectomy. No retained calcified stone noted in the central CBD. Pancreas: A 2 x 2 cm hypodense lesion from the medial uncinate process of the pancreas similar to prior CT, not characterized on this exam, likely a side branch IPMN. No active inflammatory changes. Spleen: Normal in size without focal abnormality. Adrenals/Urinary Tract: The adrenal glands unremarkable. There is no hydronephrosis on either side. There is symmetric enhancement and excretion of contrast by both kidneys. The visualized ureters and urinary bladder appear unremarkable. Stomach/Bowel: There is no bowel obstruction or active inflammation. Mild distal colonic diverticulosis. The appendix is normal. Vascular/Lymphatic: Moderate aortoiliac atherosclerotic disease. The IVC is unremarkable. No portal venous gas. There is no adenopathy. Reproductive: The uterus is grossly unremarkable. No suspicious adnexal masses. Other: None Musculoskeletal: Osteopenia with degenerative changes of spine. No acute osseous pathology. Review of the MIP images confirms the above findings. IMPRESSION: 1. No acute intrathoracic, abdominal, or pelvic pathology. No aortic aneurysm or dissection. No CT evidence of pulmonary embolism. 2. Eventration of the left hemidiaphragm with left lung base atelectasis. 3. No bowel obstruction. Normal appendix. 4.  Aortic Atherosclerosis (ICD10-I70.0). Electronically Signed: By: Vanetta Chou M.D. On: 02/07/2024 17:16   CT ABDOMEN PELVIS W CONTRAST Addendum Date: 02/07/2024 ADDENDUM REPORT: 02/07/2024  18:06 ADDENDUM: There is thickening of the skin of the left breast. There is diffuse stranding and edema in the soft tissues of the left breast and left lateral chest wall most consistent with cellulitis. No drainable fluid collection or abscess. No soft tissue gas. Electronically Signed   By: Vanetta Chou M.D.   On: 02/07/2024 18:06   Result Date: 02/07/2024 CLINICAL DATA:  Leg and back pain. EXAM: CT ANGIOGRAPHY CHEST CT ABDOMEN AND PELVIS WITH CONTRAST TECHNIQUE: Multidetector CT imaging of the chest was performed using the standard protocol during bolus administration of intravenous contrast. Multiplanar CT image reconstructions and MIPs were obtained to evaluate the vascular anatomy. Multidetector CT imaging of the abdomen and pelvis was performed using the standard protocol during bolus administration of intravenous contrast. RADIATION DOSE REDUCTION: This exam was performed according to the departmental dose-optimization program which includes automated exposure control, adjustment of the mA and/or kV according to patient size and/or use of iterative reconstruction technique. CONTRAST:  75mL OMNIPAQUE  IOHEXOL  350 MG/ML SOLN COMPARISON:  CT abdomen pelvis dated 05/11/2023. FINDINGS: CTA CHEST FINDINGS Cardiovascular: There is no cardiomegaly or pericardial effusion. There is coronary vascular calcification of the LAD. Left pectoral pacemaker device. There is moderate atherosclerotic calcification of the thoracic aorta. No aneurysmal dilatation or dissection. The origins of the great vessels of the aortic  arch are patent. No pulmonary artery embolus identified. Mediastinum/Nodes: No hilar or mediastinal adenopathy. The esophagus is grossly unremarkable. No mediastinal fluid collection. Lungs/Pleura: There is eventration of the left hemidiaphragm with left lung base atelectasis. No focal consolidation, pleural effusion or pneumothorax. The central airways are patent. Musculoskeletal: Osteopenia with  degenerative changes of the spine. No acute osseous pathology. Review of the MIP images confirms the above findings. CT ABDOMEN and PELVIS FINDINGS No intra-abdominal free air or free fluid. Hepatobiliary: The liver is unremarkable. There is biliary ductal dilatation, post cholecystectomy. No retained calcified stone noted in the central CBD. Pancreas: A 2 x 2 cm hypodense lesion from the medial uncinate process of the pancreas similar to prior CT, not characterized on this exam, likely a side branch IPMN. No active inflammatory changes. Spleen: Normal in size without focal abnormality. Adrenals/Urinary Tract: The adrenal glands unremarkable. There is no hydronephrosis on either side. There is symmetric enhancement and excretion of contrast by both kidneys. The visualized ureters and urinary bladder appear unremarkable. Stomach/Bowel: There is no bowel obstruction or active inflammation. Mild distal colonic diverticulosis. The appendix is normal. Vascular/Lymphatic: Moderate aortoiliac atherosclerotic disease. The IVC is unremarkable. No portal venous gas. There is no adenopathy. Reproductive: The uterus is grossly unremarkable. No suspicious adnexal masses. Other: None Musculoskeletal: Osteopenia with degenerative changes of spine. No acute osseous pathology. Review of the MIP images confirms the above findings. IMPRESSION: 1. No acute intrathoracic, abdominal, or pelvic pathology. No aortic aneurysm or dissection. No CT evidence of pulmonary embolism. 2. Eventration of the left hemidiaphragm with left lung base atelectasis. 3. No bowel obstruction. Normal appendix. 4.  Aortic Atherosclerosis (ICD10-I70.0). Electronically Signed: By: Vanetta Chou M.D. On: 02/07/2024 17:16   VAS US  LOWER EXTREMITY VENOUS (DVT) (ONLY [facility]) Result Date: 02/07/2024  Lower Venous DVT Study Patient Name:  Alexandra [name]  Date of Exam:   02/07/2024 Medical Rec #: [id number]         Accession #:    [id number] Date of Birth:  1927-03-28         Patient Gender: F Patient Age:   89 years Exam Location:  [facility] Procedure:      VAS US  LOWER EXTREMITY VENOUS (DVT) Referring Phys: LAMAR PATERSON --------------------------------------------------------------------------------  Indications: Swelling.  Risk Factors: None identified. Limitations: Poor ultrasound/tissue interface and body habitus. Comparison Study: No prior studies. Performing Technologist: Cordella Collet RVT  Examination Guidelines: A complete evaluation includes B-mode imaging, spectral Doppler, color Doppler, and power Doppler as needed of all accessible portions of each vessel. Bilateral testing is considered an integral part of a complete examination. Limited examinations for reoccurring indications may be performed as noted. The reflux portion of the exam is performed with the patient in reverse Trendelenburg.  +---------+---------------+---------+-----------+----------+-------------------+ RIGHT    CompressibilityPhasicitySpontaneityPropertiesThrombus Aging      +---------+---------------+---------+-----------+----------+-------------------+ CFV      Full           Yes      Yes                                      +---------+---------------+---------+-----------+----------+-------------------+ SFJ      Full                                                             +---------+---------------+---------+-----------+----------+-------------------+  FV Prox  Full                                                             +---------+---------------+---------+-----------+----------+-------------------+ FV Mid   Full                                                             +---------+---------------+---------+-----------+----------+-------------------+ FV DistalFull                                                             +---------+---------------+---------+-----------+----------+-------------------+ PFV      Full                                                              +---------+---------------+---------+-----------+----------+-------------------+ POP      Full           Yes      Yes                                      +---------+---------------+---------+-----------+----------+-------------------+ PTV      Full                                                             +---------+---------------+---------+-----------+----------+-------------------+ PERO                                                  Not well visualized +---------+---------------+---------+-----------+----------+-------------------+   +----+---------------+---------+-----------+----------+--------------+ LEFTCompressibilityPhasicitySpontaneityPropertiesThrombus Aging +----+---------------+---------+-----------+----------+--------------+ CFV Full           Yes      Yes                                 +----+---------------+---------+-----------+----------+--------------+    Summary: RIGHT: - There is no evidence of deep vein thrombosis in the lower extremity. However, portions of this examination were limited- see technologist comments above.  - No cystic structure found in the popliteal fossa.  LEFT: - No evidence of common femoral vein obstruction.   *See table(s) above for measurements and observations. Electronically signed by Gaile New MD on 02/07/2024 at 5:42:45 PM.    Final    DG Hip Unilat W or Wo Pelvis 2-3 Views Right Result Date: 02/07/2024 CLINICAL DATA:  Right hip pain  for several weeks EXAM: DG HIP (WITH OR WITHOUT PELVIS) 2-3V RIGHT COMPARISON:  None Available. FINDINGS: There is no evidence of hip fracture or dislocation. No significant joint space narrowing is noted. Mild osteophyte formation of right hip is noted. IMPRESSION: Mild degenerative joint disease of right hip. No acute abnormality seen. Electronically Signed   By: Lynwood Landy Raddle M.D.   On: 02/07/2024 16:28    Assessment and  Plan  Cellulitis of left breast/lateral chest wall Mild tachycardia has resolved.  Afebrile and no significant leukocytosis on labs.  No signs of sepsis at this time.  Lactate normal.  Continue antibiotic coverage with vancomycin  and ceftriaxone .  Follow-up blood cultures.  Trend WBC count.  Chronic HFmrEF Last echo done in October 2023 showing EF 40 to 45%.  proBNP 778 but no signs of volume overload at this time.  Holding diuretics at this time due to slightly soft blood pressure.  Continue to monitor volume status very closely.  Elevated troponin Troponin 27.  Patient is not endorsing chest pain and resting comfortably.  EKG showing paced rhythm and no acute ischemic changes.  Continue to trend troponin.  Chronic bilateral leg pain, R >L X-ray of right hip/pelvis showing mild degenerative joint disease of right hip and no acute osseous abnormality.  Right lower extremity DVT study negative. Continue pain management.  CKD stage IIIa Creatinine stable.  A-fib History of DVT/PE Continue Eliquis .  Anxiety/depression Continue Celexa .  Asthma Stable, no wheezing.  Continue albuterol  PRN.  GERD Continue Pepcid .  Hypothyroidism Continue Synthroid .  DVT prophylaxis: Eliquis  Code Status: DNR pre-arrest interventions desired (discussed with the patient and her daughter) Family Communication: Daughter at bedside. Level of care: Telemetry bed Admission status: It is my clinical opinion that referral for OBSERVATION is reasonable and necessary in this patient based on the above information provided. The aforementioned taken together are felt to place the patient at high risk for further clinical deterioration. However, it is anticipated that the patient may be medically stable for discharge from the hospital within 24 to 48 hours.  Editha Ram MD [facility]  If 7PM-7AM, please contact night-coverage [email or web address]  02/07/2024, 9:06 PM       [1]  Allergies Allergen  Reactions   Amiodarone  Shortness Of Breath and Nausea And Vomiting   Cymbalta [Duloxetine Hcl] Other (See Comments)    Depression   Lyrica [Pregabalin] Other (See Comments)    Blurry vision   "

## 2024-02-07 NOTE — ED Provider Triage Note (Signed)
 Emergency Medicine Provider Triage Evaluation Note  Alexandra [name] , a 89 y.o. female  was evaluated in triage.  Pt complains of bilat leg pain and swelling and redness to left breast.   Review of Systems  Positive: Arthritis knees,  Negative: fever  Physical Exam  BP (!) 140/59 (BP Location: Left Arm)   Pulse (!) 102   Temp 98.1 F (36.7 C) (Oral)   Resp 18   Ht 5' 4 (1.626 m)   Wt 77.1 kg   SpO2 95%   BMI 29.18 kg/m  Gen:   Awake, uncomfortable Resp:  Normal effort  MSK:   Moves extremities with pain Other:  Left breast red swollen   Medical Decision Making  Medically screening exam initiated at 1:42 PM.  Appropriate orders placed.  Alexandra [name] was informed that the remainder of the evaluation will be completed by another provider, this initial triage assessment does not replace that evaluation, and the importance of remaining in the ED until their evaluation is complete.     Flint Sonny POUR, [place] 02/07/24 1346

## 2024-02-07 NOTE — Progress Notes (Signed)
 Pharmacy Antibiotic Note  Alexandra [name] is a 89 y.o. female admitted on 02/07/2024 with breast swelling/shortness of breath/cellulitis of torso.  Pharmacy has been consulted for vancomycin  dosing.  -WBC 11, afebrile, sCr 1.11 (~bl)  Plan: -Vancomycin  1750mg  IV x1 -Vancomycin  750mg  IV every 24 hours (AUC 508, Vd 0.72, IBW, sCr 1.11) -Monitor renal function -Follow up signs of clinical improvement, LOT, de-escalation of antibiotics   Height: 5' 4 (162.6 cm) Weight: 77.1 kg (170 lb) IBW/kg (Calculated) : 54.7  Temp (24hrs), Avg:98.3 F (36.8 C), Min:98 F (36.7 C), Max:98.6 F (37 C)  Recent Labs  Lab 02/07/24 1359 02/07/24 1405  WBC  --  10.7*  CREATININE  --  1.11*  LATICACIDVEN 0.9  --     Estimated Creatinine Clearance: 31.2 mL/min (A) (by C-G formula based on SCr of 1.11 mg/dL (H)).    Allergies[1]  Antimicrobials this admission: Ceftriaxone  1/20 >> Vancomycin  1/21 >>   Microbiology results: 1/20 BCx:   Lynwood Poplar, PharmD, BCPS Clinical Pharmacist 02/07/2024 10:17 PM     [1]  Allergies Allergen Reactions   Amiodarone  Shortness Of Breath and Nausea And Vomiting   Cymbalta [Duloxetine Hcl] Other (See Comments)    Depression   Lyrica [Pregabalin] Other (See Comments)    Blurry vision

## 2024-02-07 NOTE — ED Triage Notes (Signed)
 Pt c/o pain in legs bilatx59yr, but getting worse over last couple fo weeks. Pt c/o SOBx81mo Pt c/o left breast is swollenx1wk.

## 2024-02-08 DIAGNOSIS — D631 Anemia in chronic kidney disease: Secondary | ICD-10-CM | POA: Diagnosis present

## 2024-02-08 DIAGNOSIS — R0602 Shortness of breath: Secondary | ICD-10-CM | POA: Diagnosis present

## 2024-02-08 DIAGNOSIS — Z7901 Long term (current) use of anticoagulants: Secondary | ICD-10-CM | POA: Diagnosis not present

## 2024-02-08 DIAGNOSIS — F32A Depression, unspecified: Secondary | ICD-10-CM | POA: Diagnosis present

## 2024-02-08 DIAGNOSIS — Z8249 Family history of ischemic heart disease and other diseases of the circulatory system: Secondary | ICD-10-CM | POA: Diagnosis not present

## 2024-02-08 DIAGNOSIS — Z833 Family history of diabetes mellitus: Secondary | ICD-10-CM | POA: Diagnosis not present

## 2024-02-08 DIAGNOSIS — E7800 Pure hypercholesterolemia, unspecified: Secondary | ICD-10-CM | POA: Diagnosis present

## 2024-02-08 DIAGNOSIS — N1831 Chronic kidney disease, stage 3a: Secondary | ICD-10-CM | POA: Diagnosis present

## 2024-02-08 DIAGNOSIS — Z823 Family history of stroke: Secondary | ICD-10-CM | POA: Diagnosis not present

## 2024-02-08 DIAGNOSIS — Z800 Family history of malignant neoplasm of digestive organs: Secondary | ICD-10-CM | POA: Diagnosis not present

## 2024-02-08 DIAGNOSIS — N61 Mastitis without abscess: Secondary | ICD-10-CM | POA: Diagnosis present

## 2024-02-08 DIAGNOSIS — Z950 Presence of cardiac pacemaker: Secondary | ICD-10-CM | POA: Diagnosis not present

## 2024-02-08 DIAGNOSIS — G8929 Other chronic pain: Secondary | ICD-10-CM | POA: Diagnosis present

## 2024-02-08 DIAGNOSIS — I5042 Chronic combined systolic (congestive) and diastolic (congestive) heart failure: Secondary | ICD-10-CM | POA: Diagnosis present

## 2024-02-08 DIAGNOSIS — I4891 Unspecified atrial fibrillation: Secondary | ICD-10-CM | POA: Diagnosis present

## 2024-02-08 DIAGNOSIS — M79604 Pain in right leg: Secondary | ICD-10-CM

## 2024-02-08 DIAGNOSIS — Z820 Family history of epilepsy and other diseases of the nervous system: Secondary | ICD-10-CM | POA: Diagnosis not present

## 2024-02-08 DIAGNOSIS — N63 Unspecified lump in unspecified breast: Secondary | ICD-10-CM

## 2024-02-08 DIAGNOSIS — L0390 Cellulitis, unspecified: Secondary | ICD-10-CM | POA: Diagnosis not present

## 2024-02-08 DIAGNOSIS — J45909 Unspecified asthma, uncomplicated: Secondary | ICD-10-CM | POA: Diagnosis present

## 2024-02-08 DIAGNOSIS — K219 Gastro-esophageal reflux disease without esophagitis: Secondary | ICD-10-CM | POA: Diagnosis present

## 2024-02-08 DIAGNOSIS — L03313 Cellulitis of chest wall: Secondary | ICD-10-CM | POA: Diagnosis present

## 2024-02-08 DIAGNOSIS — M353 Polymyalgia rheumatica: Secondary | ICD-10-CM | POA: Diagnosis present

## 2024-02-08 DIAGNOSIS — Z853 Personal history of malignant neoplasm of breast: Secondary | ICD-10-CM | POA: Diagnosis not present

## 2024-02-08 DIAGNOSIS — I130 Hypertensive heart and chronic kidney disease with heart failure and stage 1 through stage 4 chronic kidney disease, or unspecified chronic kidney disease: Secondary | ICD-10-CM | POA: Diagnosis present

## 2024-02-08 DIAGNOSIS — Z66 Do not resuscitate: Secondary | ICD-10-CM | POA: Diagnosis present

## 2024-02-08 DIAGNOSIS — Z79890 Hormone replacement therapy (postmenopausal): Secondary | ICD-10-CM | POA: Diagnosis not present

## 2024-02-08 DIAGNOSIS — E039 Hypothyroidism, unspecified: Secondary | ICD-10-CM | POA: Diagnosis present

## 2024-02-08 LAB — MRSA NEXT GEN BY PCR, NASAL: MRSA by PCR Next Gen: NOT DETECTED

## 2024-02-08 MED ORDER — SODIUM CHLORIDE 0.9% FLUSH
10.0000 mL | Freq: Two times a day (BID) | INTRAVENOUS | Status: DC
  Administered 2024-02-09: 10 mL

## 2024-02-08 MED ORDER — CEFAZOLIN SODIUM-DEXTROSE 2-4 GM/100ML-% IV SOLN
2.0000 g | Freq: Three times a day (TID) | INTRAVENOUS | Status: DC
  Administered 2024-02-08 – 2024-02-09 (×4): 2 g via INTRAVENOUS
  Filled 2024-02-08 (×4): qty 100

## 2024-02-08 MED ORDER — OXYCODONE HCL 5 MG PO TABS
5.0000 mg | ORAL_TABLET | Freq: Once | ORAL | Status: AC | PRN
  Administered 2024-02-08: 5 mg via ORAL
  Filled 2024-02-08: qty 1

## 2024-02-08 MED ORDER — SODIUM CHLORIDE 0.9% FLUSH: 10.0000 mL | INTRAVENOUS | Status: DC | PRN

## 2024-02-08 NOTE — TOC Initial Note (Addendum)
 Transition of Care (TOC) - Initial/Assessment Note   Spoke to patient at bedside.   Patient confirmed face sheet information.   PCP is DR Carlin Dasie Gull, however , Dr Gull is retiring today and she believes she has been asignned to a Dr Delayne.   NCM called PCP office confirmed above.   Scheduled a hospital follow up appointment with Dr Artist Delayne , Feb 2 , 2026 at 1130 am. Information placed on AVS    Patient from home alone , however has 5 children close by who check on her and assist. One child is currently sick.   Patient has a walker and a wheelchair at home already.   Inpatient Care Management will continue to follow for discharge needs. Await PT  recommendations.  Patient Details  Name: Alexandra [name] MRN: [id number] Date of Birth: 02/04/1927  [facility] CM/SW Contact:    Stephane Powell Jansky, RN Phone Number: 02/08/2024, 10:09 AM  Clinical Narrative:                   Expected Discharge Plan: Home/Self Care (await recommendations) Barriers to Discharge: Continued Medical Work up   Patient Goals and CMS Choice Patient states their goals for this hospitalization and ongoing recovery are:: to return to home          Expected Discharge Plan and Services   Discharge Planning Services: CM Consult Post Acute Care Choice:  (await recommendations) Living arrangements for the past 2 months: Single Family Home                 DME Arranged:  (await recommendations)         HH Arranged:  (await recommendations)          Prior Living Arrangements/Services Living arrangements for the past 2 months: Single Family Home Lives with:: Self Patient language and need for interpreter reviewed:: Yes Do you feel safe going back to the place where you live?: Yes      Need for Family Participation in Patient Care: Yes (Comment) Care giver support system in place?: Yes (comment) Current home services: DME Criminal Activity/Legal Involvement Pertinent to  Current Situation/Hospitalization: No - Comment as needed  Activities of Daily Living      Permission Sought/Granted   Permission granted to share information with : Yes, Verbal Permission Granted     Permission granted to share info w AGENCY: PCP office        Emotional Assessment Appearance:: Appears stated age Attitude/Demeanor/Rapport: Engaged Affect (typically observed): Appropriate Orientation: : Oriented to Self, Oriented to Place, Oriented to  Time, Oriented to Situation Alcohol / Substance Use: Not Applicable Psych Involvement: No (comment)  Admission diagnosis:  Shortness of breath [R06.02] Breast swelling [N63.0] Cellulitis [L03.90] Chronic pain of right lower extremity [M79.604, G89.29] Cellulitis, unspecified cellulitis site [L03.90] Patient Active Problem List   Diagnosis Date Noted   Cellulitis 02/07/2024   Chronic diastolic CHF (congestive heart failure) (HCC) 02/07/2024   Elevated troponin 02/07/2024   Atrial fibrillation (HCC) 02/07/2024   Bilateral radiating leg pain 03/30/2023   Non-ischemic cardiomyopathy (HCC) 03/18/2020   Atrial tachycardia 03/18/2020   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 03/18/2020   Degenerative arthritis of knee, bilateral 07/04/2017   Dyspnea    Chronic systolic heart failure (HCC)    Bilateral leg pain 02/07/2015   Leukocytosis 01/31/2015   Chronic systolic CHF (congestive heart failure), NYHA class 3 (HCC) 01/16/2015   Protein-calorie malnutrition, severe 01/15/2015  Infection due to Corynebacterium diphtheriae 12/24/2014   Bacteremia    Fever 12/19/2014   Chronic systolic CHF (congestive heart failure), NYHA class 4 (HCC) 10/23/2014   Situational depression    Frequent headaches    Pulmonary embolism (HCC) 10/10/2014   Chronic anticoagulation 10/10/2014   Takotsubo syndrome 08/19/2014   HCAP (healthcare-associated pneumonia) 08/17/2014   H/O Asthma 08/16/2014   LBBB (left bundle branch  block) 08/16/2014   Cardiomyopathy- etiology not yet determined 08/16/2014   PSVT (paroxysmal supraventricular tachycardia) 08/16/2014   CKD (chronic kidney disease) stage 3, GFR 30-59 ml/min (HCC) 08/13/2014   Anemia of chronic disease 08/13/2014   Hypothyroidism 08/13/2014   Acute cholecystitis 08/11/2014   Abdominal pain 08/11/2014   Essential (primary) hypertension 04/02/2014   PCP:  Okey Carlin Redbird, MD Pharmacy:   [facility] [address] [place] [address] Phone: [phone] Fax: [phone]     Social Drivers of Health (SDOH) Social History: SDOH Screenings   Tobacco Use: Low Risk (02/07/2024)   SDOH Interventions:     Readmission Risk Interventions     No data to display

## 2024-02-08 NOTE — Progress Notes (Signed)
 " Progress Note   Patient: Alexandra [name] FMW:[id number] DOB: Apr 26, 1927 DOA: 02/07/2024     0 DOS: the patient was seen and examined on 02/08/2024   Brief hospital course: 89 y.o. female with medical history significant of osteoarthritis, polymyalgia rheumatica?,  chronic pain, CHF, CKD stage IIIa, A-fib, CRT-P, Takotsubo cardiomyopathy, left bundle branch block, PE/DVT in 2016, hypertension, hyperlipidemia, hypothyroidism, anemia of chronic disease, anxiety, depression, asthma, history of left breast cancer status post lumpectomy presenting with complaints of left breast swelling and bilateral leg pain.  Patient states for the past week she has noticed that her left breast feels heavy but she is not having any pain or drainage.  She is not sure if there are any skin changes to her breast.  She reports history of left breast cancer 20 years ago which was treated with lumpectomy and she took tamoxifen for 5 years.  She reports history of chronic bilateral leg pain for the past 1 year (R >L) for which she is seen by pain management and has received hyaluronic acid knee injections.  She takes hydrocodone  at home for her chronic pain.  Also reports chronic shortness of breath due to history of asthma and does not think this is any worse than her baseline.  She uses albuterol  inhaler at home as needed.  Denies fevers, chills, cough, chest pain, nausea, vomiting, abdominal pain, or diarrhea.   ED Course: Mildly tachycardic on arrival but remainder of vital signs stable.  Afebrile.  Labs notable for WBC count 10.7, creatinine 1.1 (stable), blood cultures in process, lactic acid normal, proBNP 778, troponin 27, CK normal.  X-ray of right hip/pelvis showing mild degenerative joint disease of right hip and no acute osseous abnormality.  CTA chest negative for PE or pneumonia but showing findings consistent with cellulitis of the left breast and left lateral chest wall.  No abscess or soft tissue gas seen.  CT abdomen  pelvis showing no acute findings.  Right lower extremity DVT study negative.  Patient was given morphine , Zofran , and ceftriaxone .  Assessment and Plan: Cellulitis of left breast/lateral chest wall Mild tachycardia has resolved.  Afebrile and no significant leukocytosis on labs.  No signs of sepsis at this time.  Lactate normal.   -F/u on blood cx.  -erythema seems to have progressed overnight into this AM, see pic below -Will narrow abx to IV ancef    Chronic HFmrEF Last echo done in October 2023 showing EF 40 to 45%.  proBNP 778 but no signs of volume overload at this time.  -cont to hold diuretic for now   Elevated troponin Troponin 27.  Patient is not endorsing chest pain and resting comfortably.  EKG showing paced rhythm and no acute ischemic changes.  -No chest pain this AM   Chronic bilateral leg pain, R >L X-ray of right hip/pelvis showing mild degenerative joint disease of right hip and no acute osseous abnormality.  Right lower extremity DVT study negative. Continue pain management. -Reports pain improved this AM   CKD stage IIIa Creatinine stable.   A-fib History of DVT/PE Continue Eliquis .   Anxiety/depression Continue Celexa .   Asthma Stable, no wheezing.  Continue albuterol  PRN.   GERD Continue Pepcid .   Hypothyroidism Continue Synthroid .         Subjective: States LE pain has improved with current analgesia  Physical Exam: Vitals:   02/07/24 2030 02/07/24 2130 02/07/24 2157 02/08/24 0408  BP: (!) 115/51 (!) 107/50 (!) 135/58 (!) 121/52  Pulse: 91 96  100 (!) 105  Resp:  16 18   Temp:   98.6 F (37 C) 98.4 F (36.9 C)  TempSrc:   Oral Oral  SpO2: 91% 94% 95% 92%  Weight:      Height:       General exam: Awake, laying in bed, in nad Respiratory system: Normal respiratory effort, no wheezing Cardiovascular system: regular rate, s1, s2 Gastrointestinal system: Soft, nondistended, positive BS Central nervous system: CN2-12 grossly intact,  strength intact Extremities: Perfused, no clubbing Skin: Normal skin turgor, patch of erythema as noted below:  Today   Yesterday    Psychiatry: Mood normal // affect seems normal  Data Reviewed:  Labs reviewed: WBC 9.5, Hgb 12.6, Plts 154  Family Communication: Pt in room, family at bedside  Disposition: Status is: Observation The patient will require care spanning > 2 midnights and should be moved to inpatient because: severity of illness  Planned Discharge Destination: Home    Author: Garnette Pelt, MD 02/08/2024 10:06 AM  For on call review [email or web address].  "

## 2024-02-08 NOTE — Progress Notes (Signed)

## 2024-02-08 NOTE — Hospital Course (Signed)
 89 y.o. female with medical history significant of osteoarthritis, polymyalgia rheumatica?,  chronic pain, CHF, CKD stage IIIa, A-fib, CRT-P, Takotsubo cardiomyopathy, left bundle branch block, PE/DVT in 2016, hypertension, hyperlipidemia, hypothyroidism, anemia of chronic disease, anxiety, depression, asthma, history of left breast cancer status post lumpectomy presenting with complaints of left breast swelling and bilateral leg pain.  Patient states for the past week she has noticed that her left breast feels heavy but she is not having any pain or drainage.  She is not sure if there are any skin changes to her breast.  She reports history of left breast cancer 20 years ago which was treated with lumpectomy and she took tamoxifen for 5 years.  She reports history of chronic bilateral leg pain for the past 1 year (R >L) for which she is seen by pain management and has received hyaluronic acid knee injections.  She takes hydrocodone  at home for her chronic pain.  Also reports chronic shortness of breath due to history of asthma and does not think this is any worse than her baseline.  She uses albuterol  inhaler at home as needed.  Denies fevers, chills, cough, chest pain, nausea, vomiting, abdominal pain, or diarrhea.   ED Course: Mildly tachycardic on arrival but remainder of vital signs stable.  Afebrile.  Labs notable for WBC count 10.7, creatinine 1.1 (stable), blood cultures in process, lactic acid normal, proBNP 778, troponin 27, CK normal.  X-ray of right hip/pelvis showing mild degenerative joint disease of right hip and no acute osseous abnormality.  CTA chest negative for PE or pneumonia but showing findings consistent with cellulitis of the left breast and left lateral chest wall.  No abscess or soft tissue gas seen.  CT abdomen pelvis showing no acute findings.  Right lower extremity DVT study negative.  Patient was given morphine , Zofran , and ceftriaxone .

## 2024-02-08 NOTE — Progress Notes (Signed)
 Pt arrived to the unit.

## 2024-02-08 NOTE — Evaluation (Signed)
 Physical Therapy Evaluation Patient Details Name: Alexandra [name] MRN: [id number] DOB: [birth year] Today's Date: 02/08/2024  History of Present Illness  Pt is a 89 yo presenting 1/20 to [facility] due to shortness of breathe, breast swelling and leg pain over the past 6 weeks. PMH: breast cancer, osteoarthritis, CKD, HTN, sacroiliitis, polymyalgia rheumatica, DVT/PE on eliquis , CHF and chronic pain.  Clinical Impression  Pt is currently Min A for bed mobility, CGA for sit to stand and 3 ft of gait with RW. Pt has good hand placement with sit to stand and has supportive family who are able to assist but not 24/7. Pt currently is self limiting due to pain with gait declining to ambulate further than 3 ft. Pt biggest concern is her bil LE pain which has been present for over 1 year. Due to pt current functional status, home set up and available assistance at home recommending skilled physical therapy services < 3 hours/day in order to address strength, balance and functional mobility to decrease risk for falls, injury, immobility, skin break down and re-hospitalization.          If plan is discharge home, recommend the following: A little help with walking and/or transfers;Assistance with cooking/housework;Assist for transportation;Help with stairs or ramp for entrance   Can travel by private vehicle   Yes    Equipment Recommendations BSC/3in1     Functional Status Assessment Patient has had a recent decline in their functional status and demonstrates the ability to make significant improvements in function in a reasonable and predictable amount of time.     Precautions / Restrictions Precautions Precautions: Fall Recall of Precautions/Restrictions: Intact Restrictions Weight Bearing Restrictions Per Provider Order: No      Mobility  Bed Mobility Overal bed mobility: Needs Assistance Bed Mobility: Supine to Sit, Sit to Supine     Supine to sit: Min assist Sit to supine: Min assist    General bed mobility comments: Min A for trunk to mid line and Min A for LE to bed. pt has been sleeping on lazy boy sofa due to difficulty getting in/out of bed.    Transfers Overall transfer level: Needs assistance Equipment used: Rolling walker (2 wheels) Transfers: Sit to/from Stand Sit to Stand: Contact guard assist           General transfer comment: CGA for safety, good hand placement and pushing up.    Ambulation/Gait Ambulation/Gait assistance: Contact guard assist Gait Distance (Feet): 3 Feet Assistive device: Rolling walker (2 wheels) Gait Pattern/deviations: Step-through pattern, Decreased step length - right, Decreased step length - left Gait velocity: decreased Gait velocity interpretation: <1.31 ft/sec, indicative of household ambulator   General Gait Details: Short steps, pt took 4 steps away from EOB, then stated she was weak and needed to go back to the bed. Pt reported severe pain in shooting down the RLE and started stepping posteriorly to sit EOB. Pt declined to ambulate further. No notable tremors or buckling at the knees prior to sitting.    Balance Overall balance assessment: Mild deficits observed, not formally tested, Needs assistance Sitting-balance support: Single extremity supported, Feet supported Sitting balance-Leahy Scale: Good     Standing balance support: Bilateral upper extremity supported, During functional activity Standing balance-Leahy Scale: Fair Standing balance comment: No notable LOB. pt has had 1 fall in 6 months.         Pertinent Vitals/Pain Pain Assessment Pain Assessment: Faces Faces Pain Scale: Hurts even more Pain Location: bil LE Pain Descriptors /  Indicators: Aching, Discomfort, Grimacing, Guarding Pain Intervention(s): Limited activity within patient's tolerance, Monitored during session, Repositioned    Home Living Family/patient expects to be discharged to:: Private residence Living Arrangements:  Alone Available Help at Discharge: Family;Available PRN/intermittently Type of Home: House Home Access: Stairs to enter Entrance Stairs-Rails: Can reach both Entrance Stairs-Number of Steps: 2   Home Layout: Two level;Able to live on main level with bedroom/bathroom Home Equipment: Rolling Walker (2 wheels);Cane - quad;Shower seat;Wheelchair - manual      Prior Function Prior Level of Function : Independent/Modified Independent;History of Falls (last six months)             Mobility Comments: reports 1 fall in 6 months, States she has been using RW in the home and w/C out of the home. ADLs Comments: Family is with pt when she showers.     Extremity/Trunk Assessment   Upper Extremity Assessment Upper Extremity Assessment: Generalized weakness    Lower Extremity Assessment Lower Extremity Assessment: Generalized weakness    Cervical / Trunk Assessment Cervical / Trunk Assessment: Kyphotic  Communication   Communication Communication: No apparent difficulties    Cognition Arousal: Alert Behavior During Therapy: Anxious   PT - Cognitive impairments: No apparent impairments     Following commands: Intact       Cueing Cueing Techniques: Verbal cues     General Comments General comments (skin integrity, edema, etc.): daughter present during session.        Assessment/Plan    PT Assessment Patient needs continued PT services  PT Problem List Decreased strength;Decreased activity tolerance;Decreased balance;Decreased mobility;Pain;Decreased safety awareness       PT Treatment Interventions DME instruction;Therapeutic exercise;Gait training;Balance training;Stair training;Functional mobility training;Therapeutic activities;Patient/family education;Wheelchair mobility training    PT Goals (Current goals can be found in the Care Plan section)  Acute Rehab PT Goals Patient Stated Goal: To improve pain in the legs. PT Goal Formulation: With patient/family Time  For Goal Achievement: 02/22/24 Potential to Achieve Goals: Fair    Frequency Min 2X/week        AM-PAC PT 6 Clicks Mobility  Outcome Measure Help needed turning from your back to your side while in a flat bed without using bedrails?: A Little Help needed moving from lying on your back to sitting on the side of a flat bed without using bedrails?: A Little Help needed moving to and from a bed to a chair (including a wheelchair)?: A Little Help needed standing up from a chair using your arms (e.g., wheelchair or bedside chair)?: A Little Help needed to walk in hospital room?: A Little Help needed climbing 3-5 steps with a railing? : A Lot 6 Click Score: 17    End of Session Equipment Utilized During Treatment: Gait belt Activity Tolerance: Patient limited by pain Patient left: in bed;with call bell/phone within reach;with bed alarm set;with family/visitor present Nurse Communication: Mobility status PT Visit Diagnosis: Unsteadiness on feet (R26.81);Other abnormalities of gait and mobility (R26.89);Muscle weakness (generalized) (M62.81);History of falling (Z91.81);Pain Pain - Right/Left:  (bil) Pain - part of body: Leg    Time: 8665-8595 PT Time Calculation (min) (ACUTE ONLY): 30 min   Charges:   PT Evaluation $PT Eval Low Complexity: 1 Low PT Treatments $Therapeutic Activity: 8-22 mins PT General Charges $$ ACUTE PT VISIT: 1 Visit         Dorothyann Maier, DPT, CLT  Acute Rehabilitation Services Office: [phone] (Secure chat preferred)   Dorothyann VEAR Maier 02/08/2024, 3:16 PM

## 2024-02-09 ENCOUNTER — Other Ambulatory Visit (HOSPITAL_COMMUNITY): Payer: Self-pay

## 2024-02-09 LAB — COMPREHENSIVE METABOLIC PANEL WITH GFR
ALT: 8 U/L (ref 0–44)
AST: 18 U/L (ref 15–41)
Albumin: 3.5 g/dL (ref 3.5–5.0)
Alkaline Phosphatase: 36 U/L — ABNORMAL LOW (ref 38–126)
Anion gap: 8 (ref 5–15)
BUN: 33 mg/dL — ABNORMAL HIGH (ref 8–23)
CO2: 27 mmol/L (ref 22–32)
Calcium: 9.1 mg/dL (ref 8.9–10.3)
Chloride: 102 mmol/L (ref 98–111)
Creatinine, Ser: 1.58 mg/dL — ABNORMAL HIGH (ref 0.44–1.00)
GFR, Estimated: 30 mL/min — ABNORMAL LOW
Glucose, Bld: 99 mg/dL (ref 70–99)
Potassium: 4.5 mmol/L (ref 3.5–5.1)
Sodium: 136 mmol/L (ref 135–145)
Total Bilirubin: 0.3 mg/dL (ref 0.0–1.2)
Total Protein: 6.1 g/dL — ABNORMAL LOW (ref 6.5–8.1)

## 2024-02-09 LAB — CBC
HCT: 36.1 % (ref 36.0–46.0)
Hemoglobin: 11.6 g/dL — ABNORMAL LOW (ref 12.0–15.0)
MCH: 33.2 pg (ref 26.0–34.0)
MCHC: 32.1 g/dL (ref 30.0–36.0)
MCV: 103.4 fL — ABNORMAL HIGH (ref 80.0–100.0)
Platelets: 130 K/uL — ABNORMAL LOW (ref 150–400)
RBC: 3.49 MIL/uL — ABNORMAL LOW (ref 3.87–5.11)
RDW: 12.7 % (ref 11.5–15.5)
WBC: 7.4 K/uL (ref 4.0–10.5)
nRBC: 0 % (ref 0.0–0.2)

## 2024-02-09 MED ORDER — HYDROCODONE-ACETAMINOPHEN 5-325 MG PO TABS
1.0000 | ORAL_TABLET | ORAL | Status: DC | PRN
  Administered 2024-02-09: 1 via ORAL
  Filled 2024-02-09: qty 1

## 2024-02-09 MED ORDER — CEPHALEXIN 250 MG PO CAPS
500.0000 mg | ORAL_CAPSULE | Freq: Two times a day (BID) | ORAL | 0 refills | Status: AC
  Filled 2024-02-09: qty 20, 5d supply, fill #0

## 2024-02-09 MED ORDER — HYDROCODONE-ACETAMINOPHEN 5-325 MG PO TABS: 1.0000 | ORAL_TABLET | Freq: Two times a day (BID) | ORAL | Status: DC | PRN

## 2024-02-09 MED ORDER — CITALOPRAM HYDROBROMIDE 20 MG PO TABS
20.0000 mg | ORAL_TABLET | Freq: Every day | ORAL | 0 refills | Status: AC
  Filled 2024-02-09: qty 30, 30d supply, fill #0

## 2024-02-09 MED ORDER — CITALOPRAM HYDROBROMIDE 20 MG PO TABS: 20.0000 mg | ORAL_TABLET | Freq: Every day | ORAL | Status: DC

## 2024-02-09 MED ORDER — CEFAZOLIN SODIUM-DEXTROSE 2-4 GM/100ML-% IV SOLN: 2.0000 g | Freq: Two times a day (BID) | INTRAVENOUS | Status: DC

## 2024-02-09 MED ORDER — HYDROCODONE-ACETAMINOPHEN 5-325 MG PO TABS
1.0000 | ORAL_TABLET | Freq: Two times a day (BID) | ORAL | 0 refills | Status: AC | PRN
  Filled 2024-02-09 (×2): qty 14, 7d supply, fill #0

## 2024-02-09 MED ORDER — OXYCODONE HCL 5 MG PO TABS
5.0000 mg | ORAL_TABLET | Freq: Once | ORAL | Status: AC | PRN
  Administered 2024-02-09: 5 mg via ORAL
  Filled 2024-02-09: qty 1

## 2024-02-09 NOTE — TOC Progression Note (Addendum)
 Transition of Care (TOC) - Progression Note   PT recommending SNF for short term rehab at discharge.   NCM and SW spoke to patient and daughter Alexandra [name] at bedside. Discussed above. Patient declined SNF at discharge. Patient prefers to go home with home health. She has had [facility] in past. Patient and daughter aware HHPT/OT only usually comes twice a week for a hour at a time.   Per PT note patient only ambulated 3 feet yesterday. Patient and daughter aware. Daughter confirmed patient will have 24/7 assistance at home. MD aware and in agreement for home health.   Patient will need bedside commode.   Entered orders for MD to sign.   Messaged Alexandra [name] with [facility] awaiting determination. Alexandra [name] with [facility] accepted referral for home health   Ordered bedside commode with Alexandra [name] with [facility]   Daughter and patient aware [facility] accepted referral. Bedside commode for home is in patient's hospital room  Patient Details  Name: Alexandra [name] MRN: [id number] Date of Birth: 1927/12/24  Transition of Care [facility]) CM/SW Contact  Alexandra Powell Jansky, RN Phone Number: 02/09/2024, 12:03 PM  Clinical Narrative:       Expected Discharge Plan: Home/Self Care (await recommendations) Barriers to Discharge: Continued Medical Work up               Expected Discharge Plan and Services   Discharge Planning Services: CM Consult Post Acute Care Choice:  (await recommendations) Living arrangements for the past 2 months: Single Family Home                 DME Arranged:  (await recommendations)         HH Arranged:  (await recommendations)           Social Drivers of Health (SDOH) Interventions SDOH Screenings   Food Insecurity: No Food Insecurity (02/08/2024)  Housing: Unknown (02/08/2024)  Transportation Needs: No Transportation Needs (02/08/2024)  Utilities: Not At Risk (02/08/2024)  Social Connections: Unknown (02/08/2024)  Tobacco Use: Low Risk (02/07/2024)    Readmission Risk  Interventions     No data to display

## 2024-02-09 NOTE — Progress Notes (Signed)
 MD made aware of pt c/o increased pain 8/10 located in BLE. Pt states she has chronic leg/back pain and takes medication daily for pain. PRN medication ordered and given (see MAR).

## 2024-02-09 NOTE — Progress Notes (Signed)
 Sent provider a message regarding PRN pain meds due to 7/10 pain complaint from pt. Order was placed. See MAR.

## 2024-02-09 NOTE — Discharge Summary (Addendum)
 " Physician Discharge Summary   Patient: Alexandra [name] MRN: [id number] DOB: 08-22-1927  Admit date:     02/07/2024  Discharge date: 02/09/24  Discharge Physician: Garnette Pelt   PCP: Okey Carlin Redbird, MD   Recommendations at discharge:    Follow up with PCP In 1-2 weeks Follow up with Pain Clinic as scheduled  Case was discussed with Pain Clinic physician. Narcotic regimen prescribed was as per recommendation by pain provider  Discharge Diagnoses: Principal Problem:   Cellulitis Active Problems:   Bilateral leg pain   Chronic diastolic CHF (congestive heart failure) (HCC)   Elevated troponin   Atrial fibrillation (HCC)  Resolved Problems:   * No resolved hospital problems. *  Hospital Course: 89 y.o. female with medical history significant of osteoarthritis, polymyalgia rheumatica?,  chronic pain, CHF, CKD stage IIIa, A-fib, CRT-P, Takotsubo cardiomyopathy, left bundle branch block, PE/DVT in 2016, hypertension, hyperlipidemia, hypothyroidism, anemia of chronic disease, anxiety, depression, asthma, history of left breast cancer status post lumpectomy presenting with complaints of left breast swelling and bilateral leg pain.  Patient states for the past week she has noticed that her left breast feels heavy but she is not having any pain or drainage.  She is not sure if there are any skin changes to her breast.  She reports history of left breast cancer 20 years ago which was treated with lumpectomy and she took tamoxifen for 5 years.  She reports history of chronic bilateral leg pain for the past 1 year (R >L) for which she is seen by pain management and has received hyaluronic acid knee injections.  She takes hydrocodone  at home for her chronic pain.  Also reports chronic shortness of breath due to history of asthma and does not think this is any worse than her baseline.  She uses albuterol  inhaler at home as needed.  Denies fevers, chills, cough, chest pain, nausea, vomiting,  abdominal pain, or diarrhea.   ED Course: Mildly tachycardic on arrival but remainder of vital signs stable.  Afebrile.  Labs notable for WBC count 10.7, creatinine 1.1 (stable), blood cultures in process, lactic acid normal, proBNP 778, troponin 27, CK normal.  X-ray of right hip/pelvis showing mild degenerative joint disease of right hip and no acute osseous abnormality.  CTA chest negative for PE or pneumonia but showing findings consistent with cellulitis of the left breast and left lateral chest wall.  No abscess or soft tissue gas seen.  CT abdomen pelvis showing no acute findings.  Right lower extremity DVT study negative.  Patient was given morphine , Zofran , and ceftriaxone .  Assessment and Plan: Cellulitis of left breast/lateral chest wall Mild tachycardia has resolved.  Afebrile and no significant leukocytosis on labs.  No signs of sepsis at this time.  Lactate normal.   -Pt was initially started on broad spectrum abx and later narrowed to ancef  -erythema improved -complete course with keflex  on discharge   Chronic HFmrEF Last echo done in October 2023 showing EF 40 to 45%.  proBNP 778 but no signs of volume overload at this time.  -continue home diuretic on d/c   Elevated troponin Troponin 27.  Patient is not endorsing chest pain and resting comfortably.  EKG showing paced rhythm and no acute ischemic changes.  -remained without chest pain   Chronic bilateral leg pain, R >L X-ray of right hip/pelvis showing mild degenerative joint disease of right hip and no acute osseous abnormality.  Right lower extremity DVT study negative. Continue pain management. -Discussed with  patient's pain clinic provider. Recommendation to prescribe Norco 5mg  with frequency increased from once daily to twice daily. Pain clinic provider has recommended providing pt with enough narcotic for pt to make it to her upcoming Pain Clinic appointment in 7 days   CKD stage IIIa Creatinine stable. Confirmed with  Nephrologist that pt's outpt baseline Cr is around 1.5, which is the same as pt's discharge Cr   A-fib History of DVT/PE Continue Eliquis .   Anxiety/depression Continue Celexa .   Asthma Stable, no wheezing.  Continue albuterol  PRN.   GERD Continue Pepcid .   Hypothyroidism Continue Synthroid .       Consultants: Discussed case with patient's Pain Clinic provider Procedures performed:   Disposition: Home Diet recommendation:  Regular diet DISCHARGE MEDICATION: Allergies as of 02/09/2024       Reactions   Amiodarone  Shortness Of Breath, Nausea And Vomiting   Cymbalta [duloxetine Hcl] Other (See Comments)   Depression   Lyrica [pregabalin] Other (See Comments)   Blurry vision        Medication List     PAUSE taking these medications    isosorbide  mononitrate 30 MG 24 hr tablet Wait to take this until your doctor or other care provider tells you to start again. Commonly known as: IMDUR  Take 1 tablet (30 mg total) by mouth daily.   spironolactone  25 MG tablet Wait to take this until your doctor or other care provider tells you to start again. Commonly known as: ALDACTONE  Take 1 tablet (25 mg total) by mouth daily.       STOP taking these medications    ALPRAZolam  0.25 MG tablet Commonly known as: XANAX    amoxicillin  500 MG capsule Commonly known as: AMOXIL    potassium chloride  10 MEQ CR capsule Commonly known as: MICRO-K    predniSONE  10 MG tablet Commonly known as: DELTASONE    traMADol  50 MG tablet Commonly known as: ULTRAM        TAKE these medications    albuterol  108 (90 Base) MCG/ACT inhaler Commonly known as: Ventolin  HFA Inhale 2 puffs into the lungs every 6 (six) hours as needed.   apixaban  2.5 MG Tabs tablet Commonly known as: Eliquis  Take 1 tablet (2.5 mg total) by mouth 2 (two) times daily.   b complex vitamins tablet Take 1 tablet by mouth daily with lunch.   calcium  carbonate 500 MG chewable tablet Commonly known as: TUMS -  dosed in mg elemental calcium  Chew 2 tablets by mouth daily as needed for indigestion or heartburn. Reported on 07/18/2015   CENTRUM ADULTS PO Take by mouth.   cephALEXin  250 MG capsule Commonly known as: KEFLEX  Take 2 capsules (500 mg total) by mouth 2 (two) times daily for 5 days.   cholecalciferol  1000 units tablet Commonly known as: VITAMIN D  Take 1,000 Units by mouth daily with lunch.   citalopram  20 MG tablet Commonly known as: CELEXA  Take 1 tablet (20 mg total) by mouth daily. Start taking on: February 10, 2024 What changed:  medication strength how much to take   famotidine  20 MG tablet Commonly known as: PEPCID  Take 1 tablet (20 mg total) by mouth daily.   ferrous sulfate 325 (65 FE) MG tablet Take 325 mg by mouth daily with breakfast.   furosemide  40 MG tablet Commonly known as: LASIX  Take 1 tablet (40 mg total) by mouth every other day.   hydrALAZINE  10 MG tablet Commonly known as: APRESOLINE  Take 1 tablet (10 mg total) by mouth 3 (three) times daily. take with food  HYDROcodone -acetaminophen  5-325 MG tablet Commonly known as: NORCO/VICODIN Take 1 tablet by mouth 2 (two) times daily as needed for up to 7 days for severe pain (pain score 7-10). What changed:  when to take this reasons to take this   lactose free nutrition Liqd Take 237 mLs by mouth daily with breakfast.   levothyroxine  25 MCG tablet Commonly known as: SYNTHROID  Take 1 tablet (25 mcg total) by mouth in the morning on an empty stomach   lidocaine  5 % Commonly known as: LIDODERM  apply 1 patch to each knee once daily remove after 12 hours   magnesium  oxide 400 (240 Mg) MG tablet Commonly known as: MAG-OX Take 0.5 tablets (200 mg total) by mouth daily.   nitroGLYCERIN  0.4 MG SL tablet Commonly known as: NITROSTAT  Place 1 tablet (0.4 mg total) under the tongue every 5 (five) minutes as needed for chest pain.   SYSTANE OP Place 1 drop into both eyes 2 (two) times daily.                Durable Medical Equipment  (From admission, onward)           Start     Ordered   02/09/24 1209  For home use only DME Bedside commode  Once       Question:  Patient needs a bedside commode to treat with the following condition  Answer:  Weakness   02/09/24 1209            Contact information for follow-up providers     Delayne Artist PARAS, MD .   Specialty: Family Medicine Why: February 20, 2024 at 1130 am Contact information: [address] [place] [address] [phone]         [employer] (DME) Follow up.   Specialty: DME Services Why: company bedside commode was ordered through [employer] information: [address] [employer] [place]  [phone]        [facility], [facility] Follow up.   Specialty: Home Health Services Contact information: [address] [place] [address] [phone]         [facility] Follow up on 02/16/2024.   Why: at 1:30pm             Contact information for after-discharge care     Home Medical Care     [facility]) .   Service: Home Health Services Contact information: [address] [place]  [address] [phone]                    Discharge Exam: Fredricka Weights   02/07/24 1214  Weight: 77.1 kg   General exam: Awake, laying in bed, in nad Respiratory system: Normal respiratory effort, no wheezing Cardiovascular system: regular rate, s1, s2 Gastrointestinal system: Soft, nondistended, positive BS Central nervous system: CN2-12 grossly intact, strength intact Extremities: Perfused, no clubbing Skin: Normal skin turgor, no notable skin lesions seen Psychiatry: Mood normal // no visual hallucinations   Condition at discharge: fair  The results of significant diagnostics from this hospitalization (including imaging, microbiology, ancillary and laboratory) are listed below for  reference.   Imaging Studies: CT Angio Chest PE W and/or Wo Contrast Addendum Date: 02/07/2024 ADDENDUM REPORT: 02/07/2024 18:06 ADDENDUM: There is thickening of the skin of the left breast. There is diffuse stranding and edema in the soft tissues of the left breast and left lateral chest wall most consistent  with cellulitis. No drainable fluid collection or abscess. No soft tissue gas. Electronically Signed   By: Vanetta Chou M.D.   On: 02/07/2024 18:06   Result Date: 02/07/2024 CLINICAL DATA:  Leg and back pain. EXAM: CT ANGIOGRAPHY CHEST CT ABDOMEN AND PELVIS WITH CONTRAST TECHNIQUE: Multidetector CT imaging of the chest was performed using the standard protocol during bolus administration of intravenous contrast. Multiplanar CT image reconstructions and MIPs were obtained to evaluate the vascular anatomy. Multidetector CT imaging of the abdomen and pelvis was performed using the standard protocol during bolus administration of intravenous contrast. RADIATION DOSE REDUCTION: This exam was performed according to the departmental dose-optimization program which includes automated exposure control, adjustment of the mA and/or kV according to patient size and/or use of iterative reconstruction technique. CONTRAST:  75mL OMNIPAQUE  IOHEXOL  350 MG/ML SOLN COMPARISON:  CT abdomen pelvis dated 05/11/2023. FINDINGS: CTA CHEST FINDINGS Cardiovascular: There is no cardiomegaly or pericardial effusion. There is coronary vascular calcification of the LAD. Left pectoral pacemaker device. There is moderate atherosclerotic calcification of the thoracic aorta. No aneurysmal dilatation or dissection. The origins of the great vessels of the aortic arch are patent. No pulmonary artery embolus identified. Mediastinum/Nodes: No hilar or mediastinal adenopathy. The esophagus is grossly unremarkable. No mediastinal fluid collection. Lungs/Pleura: There is eventration of the left hemidiaphragm with left lung base atelectasis. No  focal consolidation, pleural effusion or pneumothorax. The central airways are patent. Musculoskeletal: Osteopenia with degenerative changes of the spine. No acute osseous pathology. Review of the MIP images confirms the above findings. CT ABDOMEN and PELVIS FINDINGS No intra-abdominal free air or free fluid. Hepatobiliary: The liver is unremarkable. There is biliary ductal dilatation, post cholecystectomy. No retained calcified stone noted in the central CBD. Pancreas: A 2 x 2 cm hypodense lesion from the medial uncinate process of the pancreas similar to prior CT, not characterized on this exam, likely a side branch IPMN. No active inflammatory changes. Spleen: Normal in size without focal abnormality. Adrenals/Urinary Tract: The adrenal glands unremarkable. There is no hydronephrosis on either side. There is symmetric enhancement and excretion of contrast by both kidneys. The visualized ureters and urinary bladder appear unremarkable. Stomach/Bowel: There is no bowel obstruction or active inflammation. Mild distal colonic diverticulosis. The appendix is normal. Vascular/Lymphatic: Moderate aortoiliac atherosclerotic disease. The IVC is unremarkable. No portal venous gas. There is no adenopathy. Reproductive: The uterus is grossly unremarkable. No suspicious adnexal masses. Other: None Musculoskeletal: Osteopenia with degenerative changes of spine. No acute osseous pathology. Review of the MIP images confirms the above findings. IMPRESSION: 1. No acute intrathoracic, abdominal, or pelvic pathology. No aortic aneurysm or dissection. No CT evidence of pulmonary embolism. 2. Eventration of the left hemidiaphragm with left lung base atelectasis. 3. No bowel obstruction. Normal appendix. 4.  Aortic Atherosclerosis (ICD10-I70.0). Electronically Signed: By: Vanetta Chou M.D. On: 02/07/2024 17:16   CT ABDOMEN PELVIS W CONTRAST Addendum Date: 02/07/2024 ADDENDUM REPORT: 02/07/2024 18:06 ADDENDUM: There is thickening  of the skin of the left breast. There is diffuse stranding and edema in the soft tissues of the left breast and left lateral chest wall most consistent with cellulitis. No drainable fluid collection or abscess. No soft tissue gas. Electronically Signed   By: Vanetta Chou M.D.   On: 02/07/2024 18:06   Result Date: 02/07/2024 CLINICAL DATA:  Leg and back pain. EXAM: CT ANGIOGRAPHY CHEST CT ABDOMEN AND PELVIS WITH CONTRAST TECHNIQUE: Multidetector CT imaging of the chest was performed using the standard protocol during bolus  administration of intravenous contrast. Multiplanar CT image reconstructions and MIPs were obtained to evaluate the vascular anatomy. Multidetector CT imaging of the abdomen and pelvis was performed using the standard protocol during bolus administration of intravenous contrast. RADIATION DOSE REDUCTION: This exam was performed according to the departmental dose-optimization program which includes automated exposure control, adjustment of the mA and/or kV according to patient size and/or use of iterative reconstruction technique. CONTRAST:  75mL OMNIPAQUE  IOHEXOL  350 MG/ML SOLN COMPARISON:  CT abdomen pelvis dated 05/11/2023. FINDINGS: CTA CHEST FINDINGS Cardiovascular: There is no cardiomegaly or pericardial effusion. There is coronary vascular calcification of the LAD. Left pectoral pacemaker device. There is moderate atherosclerotic calcification of the thoracic aorta. No aneurysmal dilatation or dissection. The origins of the great vessels of the aortic arch are patent. No pulmonary artery embolus identified. Mediastinum/Nodes: No hilar or mediastinal adenopathy. The esophagus is grossly unremarkable. No mediastinal fluid collection. Lungs/Pleura: There is eventration of the left hemidiaphragm with left lung base atelectasis. No focal consolidation, pleural effusion or pneumothorax. The central airways are patent. Musculoskeletal: Osteopenia with degenerative changes of the spine. No  acute osseous pathology. Review of the MIP images confirms the above findings. CT ABDOMEN and PELVIS FINDINGS No intra-abdominal free air or free fluid. Hepatobiliary: The liver is unremarkable. There is biliary ductal dilatation, post cholecystectomy. No retained calcified stone noted in the central CBD. Pancreas: A 2 x 2 cm hypodense lesion from the medial uncinate process of the pancreas similar to prior CT, not characterized on this exam, likely a side branch IPMN. No active inflammatory changes. Spleen: Normal in size without focal abnormality. Adrenals/Urinary Tract: The adrenal glands unremarkable. There is no hydronephrosis on either side. There is symmetric enhancement and excretion of contrast by both kidneys. The visualized ureters and urinary bladder appear unremarkable. Stomach/Bowel: There is no bowel obstruction or active inflammation. Mild distal colonic diverticulosis. The appendix is normal. Vascular/Lymphatic: Moderate aortoiliac atherosclerotic disease. The IVC is unremarkable. No portal venous gas. There is no adenopathy. Reproductive: The uterus is grossly unremarkable. No suspicious adnexal masses. Other: None Musculoskeletal: Osteopenia with degenerative changes of spine. No acute osseous pathology. Review of the MIP images confirms the above findings. IMPRESSION: 1. No acute intrathoracic, abdominal, or pelvic pathology. No aortic aneurysm or dissection. No CT evidence of pulmonary embolism. 2. Eventration of the left hemidiaphragm with left lung base atelectasis. 3. No bowel obstruction. Normal appendix. 4.  Aortic Atherosclerosis (ICD10-I70.0). Electronically Signed: By: Vanetta Chou M.D. On: 02/07/2024 17:16   VAS US  LOWER EXTREMITY VENOUS (DVT) (ONLY [facility] & [facility]) Result Date: 02/07/2024  Lower Venous DVT Study Patient Name:  CHRISTINIA LAMBETH  Date of Exam:   02/07/2024 Medical Rec #: [id number]         Accession #:    [id number] Date of Birth: 17-May-1927         Patient Gender: F Patient  Age:   55 years Exam Location:  [facility] Procedure:      VAS US  LOWER EXTREMITY VENOUS (DVT) Referring Phys: LAMAR PATERSON --------------------------------------------------------------------------------  Indications: Swelling.  Risk Factors: None identified. Limitations: Poor ultrasound/tissue interface and body habitus. Comparison Study: No prior studies. Performing Technologist: Cordella Collet RVT  Examination Guidelines: A complete evaluation includes B-mode imaging, spectral Doppler, color Doppler, and power Doppler as needed of all accessible portions of each vessel. Bilateral testing is considered an integral part of a complete examination. Limited examinations for reoccurring indications may be performed as noted. The reflux portion of the exam  is performed with the patient in reverse Trendelenburg.  +---------+---------------+---------+-----------+----------+-------------------+ RIGHT    CompressibilityPhasicitySpontaneityPropertiesThrombus Aging      +---------+---------------+---------+-----------+----------+-------------------+ CFV      Full           Yes      Yes                                      +---------+---------------+---------+-----------+----------+-------------------+ SFJ      Full                                                             +---------+---------------+---------+-----------+----------+-------------------+ FV Prox  Full                                                             +---------+---------------+---------+-----------+----------+-------------------+ FV Mid   Full                                                             +---------+---------------+---------+-----------+----------+-------------------+ FV DistalFull                                                             +---------+---------------+---------+-----------+----------+-------------------+ PFV      Full                                                              +---------+---------------+---------+-----------+----------+-------------------+ POP      Full           Yes      Yes                                      +---------+---------------+---------+-----------+----------+-------------------+ PTV      Full                                                             +---------+---------------+---------+-----------+----------+-------------------+ PERO                                                  Not well visualized +---------+---------------+---------+-----------+----------+-------------------+   +----+---------------+---------+-----------+----------+--------------+  LEFTCompressibilityPhasicitySpontaneityPropertiesThrombus Aging +----+---------------+---------+-----------+----------+--------------+ CFV Full           Yes      Yes                                 +----+---------------+---------+-----------+----------+--------------+    Summary: RIGHT: - There is no evidence of deep vein thrombosis in the lower extremity. However, portions of this examination were limited- see technologist comments above.  - No cystic structure found in the popliteal fossa.  LEFT: - No evidence of common femoral vein obstruction.   *See table(s) above for measurements and observations. Electronically signed by Gaile New MD on 02/07/2024 at 5:42:45 PM.    Final    DG Hip Unilat W or Wo Pelvis 2-3 Views Right Result Date: 02/07/2024 CLINICAL DATA:  Right hip pain for several weeks EXAM: DG HIP (WITH OR WITHOUT PELVIS) 2-3V RIGHT COMPARISON:  None Available. FINDINGS: There is no evidence of hip fracture or dislocation. No significant joint space narrowing is noted. Mild osteophyte formation of right hip is noted. IMPRESSION: Mild degenerative joint disease of right hip. No acute abnormality seen. Electronically Signed   By: Lynwood Landy Raddle M.D.   On: 02/07/2024 16:28    Microbiology: Results for orders placed or performed during  the hospital encounter of 02/07/24  Blood culture (routine x 2)     Status: None (Preliminary result)   Collection Time: 02/07/24  1:42 PM   Specimen: BLOOD  Result Value Ref Range Status   Specimen Description BLOOD RIGHT ANTECUBITAL  Final   Special Requests   Final    BOTTLES DRAWN AEROBIC AND ANAEROBIC Blood Culture results may not be optimal due to an inadequate volume of blood received in culture bottles   Culture   Final    NO GROWTH 2 DAYS Performed at [facility], [address], [place], [place] [address]    Report Status PENDING  Incomplete  Blood culture (routine x 2)     Status: None (Preliminary result)   Collection Time: 02/07/24  1:47 PM   Specimen: BLOOD  Result Value Ref Range Status   Specimen Description BLOOD LEFT ANTECUBITAL  Final   Special Requests   Final    BOTTLES DRAWN AEROBIC AND ANAEROBIC Blood Culture results may not be optimal due to an inadequate volume of blood received in culture bottles   Culture   Final    NO GROWTH 2 DAYS Performed at [facility], [address], [place], [place] [address]    Report Status PENDING  Incomplete  MRSA Next Gen by PCR, Nasal     Status: None   Collection Time: 02/08/24  9:47 AM   Specimen: Nasal Mucosa; Nasal Swab  Result Value Ref Range Status   MRSA by PCR Next Gen NOT DETECTED NOT DETECTED Final    Comment: (NOTE) The GeneXpert MRSA Assay (FDA approved for NASAL specimens only), is one component of a comprehensive MRSA colonization surveillance program. It is not intended to diagnose MRSA infection nor to guide or monitor treatment for MRSA infections. Test performance is not FDA approved in patients less than 48 years old. Performed at [facility], [address], [place], [place] [address]    *Note: Due to a large number of results and/or encounters for the requested time period, some results have not been displayed. A complete set of results can be found in Results Review.  Labs: CBC: Recent Labs  Lab 02/07/24 1405 02/07/24 2305 02/09/24 0528  WBC 10.7* 9.5 7.4  NEUTROABS 6.7  --   --   HGB 13.1 12.6 11.6*  HCT 41.0 39.7 36.1  MCV 105.1* 103.9* 103.4*  PLT 171 154 130*   Basic Metabolic Panel: Recent Labs  Lab 02/07/24 1405 02/09/24 0528  NA 139 136  K 4.9 4.5  CL 102 102  CO2 27 27  GLUCOSE 95 99  BUN 36* 33*  CREATININE 1.11* 1.58*  CALCIUM  10.0 9.1   Liver Function Tests: Recent Labs  Lab 02/07/24 1405 02/09/24 0528  AST 25 18  ALT 14 8  ALKPHOS 43 36*  BILITOT 0.4 0.3  PROT 7.5 6.1*  ALBUMIN 4.3 3.5   CBG: No results for input(s): GLUCAP in the last 168 hours.  Discharge time spent: less than 30 minutes.  Signed: Garnette Pelt, MD [facility] 02/09/2024 "

## 2024-02-09 NOTE — Progress Notes (Signed)
 MD Made aware of telemetry order expiring 10 hours ago. MD Cindy advised to allow order to fall off, will not be renewing tele orders.

## 2024-02-09 NOTE — Plan of Care (Signed)

## 2024-02-10 ENCOUNTER — Ambulatory Visit: Payer: Medicare HMO

## 2024-02-10 DIAGNOSIS — I5022 Chronic systolic (congestive) heart failure: Secondary | ICD-10-CM

## 2024-02-12 LAB — CULTURE, BLOOD (ROUTINE X 2)
Culture: NO GROWTH
Culture: NO GROWTH

## 2024-02-13 ENCOUNTER — Telehealth: Payer: Self-pay

## 2024-02-13 ENCOUNTER — Ambulatory Visit: Attending: Cardiovascular Disease

## 2024-02-13 DIAGNOSIS — I5022 Chronic systolic (congestive) heart failure: Secondary | ICD-10-CM

## 2024-02-13 DIAGNOSIS — Z95810 Presence of automatic (implantable) cardiac defibrillator: Secondary | ICD-10-CM | POA: Diagnosis not present

## 2024-02-13 LAB — CUP PACEART REMOTE DEVICE CHECK
Battery Remaining Longevity: 67 mo
Battery Remaining Percentage: 72 %
Battery Voltage: 2.99 V
Brady Statistic AP VP Percent: 1 %
Brady Statistic AP VS Percent: 1 %
Brady Statistic AS VP Percent: 94 %
Brady Statistic AS VS Percent: 4.8 %
Brady Statistic RA Percent Paced: 1 %
Date Time Interrogation Session: 20260123141703
Implantable Lead Connection Status: 753985
Implantable Lead Connection Status: 753985
Implantable Lead Connection Status: 753985
Implantable Lead Implant Date: 20161229
Implantable Lead Implant Date: 20161229
Implantable Lead Implant Date: 20161229
Implantable Lead Location: 753858
Implantable Lead Location: 753859
Implantable Lead Location: 753860
Implantable Pulse Generator Implant Date: 20230728
Lead Channel Impedance Value: 380 Ohm
Lead Channel Impedance Value: 390 Ohm
Lead Channel Impedance Value: 900 Ohm
Lead Channel Pacing Threshold Amplitude: 0.625 V
Lead Channel Pacing Threshold Amplitude: 0.75 V
Lead Channel Pacing Threshold Amplitude: 1 V
Lead Channel Pacing Threshold Pulse Width: 0.5 ms
Lead Channel Pacing Threshold Pulse Width: 0.5 ms
Lead Channel Pacing Threshold Pulse Width: 0.5 ms
Lead Channel Sensing Intrinsic Amplitude: 12 mV
Lead Channel Sensing Intrinsic Amplitude: 2.1 mV
Lead Channel Setting Pacing Amplitude: 1.625
Lead Channel Setting Pacing Amplitude: 2 V
Lead Channel Setting Pacing Amplitude: 2 V
Lead Channel Setting Pacing Pulse Width: 0.5 ms
Lead Channel Setting Pacing Pulse Width: 0.5 ms
Lead Channel Setting Sensing Sensitivity: 2 mV
Pulse Gen Model: 3562
Pulse Gen Serial Number: 8101642

## 2024-02-13 NOTE — Telephone Encounter (Signed)
 Remote ICM transmission received.  Attempted call to patient regarding ICM remote transmission and no answer.

## 2024-02-13 NOTE — Progress Notes (Signed)
 EPIC Encounter for ICM Monitoring  Patient Name: Alexandra [name] is a 89 y.o. female Date: 02/13/2024 Primary Care Physican: Alexandra Carlin Redbird, MD Primary Cardiologist: Alexandra [name]/Alexandra [name] Electrophysiologist: Alexandra [name] Bi-V Pacing:  95%   09/03/2022 Weight: 172 lbs 11/17/2022 Office Weight: 165 lbs 01/26/2023 Weight: 166 lbs   04/06/2023 Weight: 170 lbs  08/05/2023 Weight: 170 lbs 10/06/2024 Office Weight: 166.12 lbs 12/05/2023 Weight: 163 lbs   AT/AF Burden  <1% (takes Eliquis )                                                             Attempted call to patient and unable to reach.   Transmission results reviewed.  Hospitalization 02/07/2024-02/09/2024 with dx of cellulitis of the breast.          Since 01/09/2024 ICM remote transmission: [facility] Thoracic impedance suggesting possible fluid accumulation starting 02/02/2024 and trending closer to baseline 02/10/2024.  Also suggesting possible fluid accumulation from 01/07/2024-01/27/2024.   Prescribed:  Furosemide  40 mg Take one tablet (40 mg) by mouth once every other day.    Labs: 02/09/2024 Creatinine 1.58, BUN 33, Potassium 4.5, Sodium 136, GFR 30 02/07/2024 Creatinine 1.11, BUN 36, Potassium 4.9, Sodium 139, GFR 46 11/16/2022 Creatinine 1.10, BUN 39, Potassium 4.5, Sodium 142, GFR 47 A complete set of results can be found in Results Review.   Recommendations:  Unable to reach.  Will send to Dr Alexandra [name] for review if patient is reached.    Follow-up plan: ICM clinic phone appointment on 02/22/2024 to recheck fluid levels.  Next 31 day ICM remote transmission scheduled for 03/15/2024.  91 day device clinic remote transmission 05/11/2024.     EP/Cardiology Office Visits:  Recall 06/23/2024 with Alexandra Passey, PA.   Recall 10/06/2024 with Dr Alexandra [name].   Copy of ICM check sent to Dr. Nancey.    Remote monitoring is medically necessary for Heart Failure Management.    Daily Thoracic Impedance ICM trend: 11/12/2023 through 02/10/2024.    12-14  Month Thoracic Impedance ICM trend:     Alexandra GORMAN Garner, RN 02/13/2024 3:05 PM

## 2024-02-14 ENCOUNTER — Ambulatory Visit: Payer: Self-pay | Admitting: Cardiovascular Disease

## 2024-02-15 NOTE — Progress Notes (Signed)
 Remote PPM Transmission

## 2024-02-15 NOTE — Progress Notes (Signed)
 02/22/2024 ICM remote transmission canceled.  No further ICM appointments will be made at this time but 91 day remote monitoring will continue.

## 2024-02-16 ENCOUNTER — Other Ambulatory Visit (HOSPITAL_COMMUNITY): Payer: Self-pay

## 2024-02-16 NOTE — Progress Notes (Signed)
 31 day ICM Remote transmission canceled due to [facility] clinic is on hold until further notice.  91 day remote monitoring will continue per protocol.

## 2024-02-17 ENCOUNTER — Other Ambulatory Visit (HOSPITAL_COMMUNITY): Payer: Self-pay

## 2024-02-17 ENCOUNTER — Other Ambulatory Visit: Payer: Self-pay

## 2024-02-18 ENCOUNTER — Other Ambulatory Visit (HOSPITAL_COMMUNITY): Payer: Self-pay

## 2024-02-18 MED ORDER — HYDROCODONE-ACETAMINOPHEN 10-325 MG PO TABS
1.0000 | ORAL_TABLET | Freq: Two times a day (BID) | ORAL | 0 refills | Status: AC | PRN
  Filled 2024-02-18 – 2024-02-21 (×2): qty 56, 28d supply, fill #0

## 2024-02-20 ENCOUNTER — Other Ambulatory Visit (HOSPITAL_COMMUNITY): Payer: Self-pay

## 2024-02-20 ENCOUNTER — Other Ambulatory Visit: Payer: Self-pay

## 2024-02-21 ENCOUNTER — Other Ambulatory Visit (HOSPITAL_COMMUNITY): Payer: Self-pay

## 2024-02-21 ENCOUNTER — Other Ambulatory Visit: Payer: Self-pay

## 2024-02-22 ENCOUNTER — Ambulatory Visit

## 2024-03-15 ENCOUNTER — Ambulatory Visit

## 2024-05-11 ENCOUNTER — Ambulatory Visit

## 2024-08-10 ENCOUNTER — Ambulatory Visit

## 2024-11-09 ENCOUNTER — Ambulatory Visit

## 2025-02-08 ENCOUNTER — Ambulatory Visit

## 2025-05-10 ENCOUNTER — Ambulatory Visit
